# Patient Record
Sex: Female | Born: 1945
Health system: Southern US, Community
[De-identification: ages and names within clinical notes are randomized; demographics above are authoritative.]

## PROBLEM LIST (undated history)

## (undated) DIAGNOSIS — E785 Hyperlipidemia, unspecified: Secondary | ICD-10-CM

## (undated) DIAGNOSIS — I10 Essential (primary) hypertension: Secondary | ICD-10-CM

## (undated) DIAGNOSIS — K859 Acute pancreatitis without necrosis or infection, unspecified: Secondary | ICD-10-CM

## (undated) DIAGNOSIS — G2581 Restless legs syndrome: Secondary | ICD-10-CM

## (undated) DIAGNOSIS — F329 Major depressive disorder, single episode, unspecified: Secondary | ICD-10-CM

## (undated) DIAGNOSIS — F32A Depression, unspecified: Secondary | ICD-10-CM

## (undated) DIAGNOSIS — I240 Acute coronary thrombosis not resulting in myocardial infarction: Secondary | ICD-10-CM

## (undated) HISTORY — PX: FOOT SURGERY: SHX648

## (undated) HISTORY — DX: Hyperlipidemia, unspecified: E78.5

## (undated) HISTORY — DX: Essential (primary) hypertension: I10

## (undated) HISTORY — PX: TUBAL LIGATION: SHX77

## (undated) HISTORY — DX: Depression, unspecified: F32.A

## (undated) HISTORY — DX: Major depressive disorder, single episode, unspecified: F32.9

## (undated) HISTORY — PX: ROTATOR CUFF REPAIR: SHX139

## (undated) HISTORY — DX: Restless legs syndrome: G25.81

## (undated) HISTORY — PX: CORONARY ANGIOPLASTY WITH STENT PLACEMENT: SHX49

## (undated) HISTORY — PX: CATARACT EXTRACTION, BILATERAL: SHX1313

---

## 1998-06-16 ENCOUNTER — Encounter: Admission: RE | Admit: 1998-06-16 | Discharge: 1998-09-14 | Payer: Self-pay | Admitting: Family Medicine

## 1999-01-25 ENCOUNTER — Encounter: Admission: RE | Admit: 1999-01-25 | Discharge: 1999-03-01 | Payer: Self-pay | Admitting: Family Medicine

## 2000-06-12 ENCOUNTER — Other Ambulatory Visit: Admission: RE | Admit: 2000-06-12 | Discharge: 2000-06-12 | Payer: Self-pay | Admitting: Obstetrics and Gynecology

## 2003-10-30 ENCOUNTER — Emergency Department (HOSPITAL_COMMUNITY): Admission: EM | Admit: 2003-10-30 | Discharge: 2003-10-30 | Payer: Self-pay | Admitting: Emergency Medicine

## 2003-11-26 ENCOUNTER — Emergency Department (HOSPITAL_COMMUNITY): Admission: EM | Admit: 2003-11-26 | Discharge: 2003-11-26 | Payer: Self-pay | Admitting: Emergency Medicine

## 2004-06-02 ENCOUNTER — Emergency Department (HOSPITAL_COMMUNITY): Admission: EM | Admit: 2004-06-02 | Discharge: 2004-06-02 | Payer: Self-pay | Admitting: Emergency Medicine

## 2004-09-07 ENCOUNTER — Emergency Department (HOSPITAL_COMMUNITY): Admission: EM | Admit: 2004-09-07 | Discharge: 2004-09-07 | Payer: Self-pay | Admitting: Emergency Medicine

## 2004-12-01 ENCOUNTER — Other Ambulatory Visit: Admission: RE | Admit: 2004-12-01 | Discharge: 2004-12-01 | Payer: Self-pay | Admitting: Obstetrics and Gynecology

## 2006-12-16 ENCOUNTER — Ambulatory Visit: Payer: Self-pay | Admitting: Family Medicine

## 2007-02-27 ENCOUNTER — Ambulatory Visit (HOSPITAL_COMMUNITY): Admission: RE | Admit: 2007-02-27 | Discharge: 2007-02-27 | Payer: Self-pay | Admitting: Emergency Medicine

## 2007-02-27 ENCOUNTER — Ambulatory Visit: Payer: Self-pay | Admitting: Vascular Surgery

## 2007-02-27 ENCOUNTER — Emergency Department (HOSPITAL_COMMUNITY): Admission: EM | Admit: 2007-02-27 | Discharge: 2007-02-27 | Payer: Self-pay | Admitting: Emergency Medicine

## 2007-02-27 ENCOUNTER — Encounter (INDEPENDENT_AMBULATORY_CARE_PROVIDER_SITE_OTHER): Payer: Self-pay | Admitting: Emergency Medicine

## 2007-03-07 DIAGNOSIS — I10 Essential (primary) hypertension: Secondary | ICD-10-CM | POA: Insufficient documentation

## 2007-03-07 DIAGNOSIS — E1165 Type 2 diabetes mellitus with hyperglycemia: Secondary | ICD-10-CM | POA: Insufficient documentation

## 2007-03-11 ENCOUNTER — Encounter: Payer: Self-pay | Admitting: Family Medicine

## 2007-03-11 ENCOUNTER — Ambulatory Visit: Payer: Self-pay | Admitting: Family Medicine

## 2007-03-11 ENCOUNTER — Other Ambulatory Visit: Admission: RE | Admit: 2007-03-11 | Discharge: 2007-03-11 | Payer: Self-pay | Admitting: Family Medicine

## 2007-03-11 DIAGNOSIS — M25559 Pain in unspecified hip: Secondary | ICD-10-CM | POA: Insufficient documentation

## 2007-03-11 DIAGNOSIS — Z8669 Personal history of other diseases of the nervous system and sense organs: Secondary | ICD-10-CM | POA: Insufficient documentation

## 2007-03-13 ENCOUNTER — Encounter: Payer: Self-pay | Admitting: Family Medicine

## 2007-03-14 ENCOUNTER — Encounter: Admission: RE | Admit: 2007-03-14 | Discharge: 2007-03-14 | Payer: Self-pay | Admitting: Family Medicine

## 2007-03-14 ENCOUNTER — Encounter: Payer: Self-pay | Admitting: Family Medicine

## 2007-03-14 LAB — CONVERTED CEMR LAB
Albumin: 4.2 g/dL (ref 3.5–5.2)
Basophils Absolute: 0 10*3/uL (ref 0.0–0.1)
Creatinine, Ser: 0.8 mg/dL (ref 0.4–1.2)
Direct LDL: 66.8 mg/dL
Eosinophils Absolute: 0.1 10*3/uL (ref 0.0–0.6)
HCT: 34.9 % — ABNORMAL LOW (ref 36.0–46.0)
Hemoglobin: 12.3 g/dL (ref 12.0–15.0)
Lymphocytes Relative: 39.6 % (ref 12.0–46.0)
MCHC: 35.4 g/dL (ref 30.0–36.0)
MCV: 86.1 fL (ref 78.0–100.0)
Monocytes Absolute: 0.5 10*3/uL (ref 0.2–0.7)
Neutrophils Relative %: 51.1 % (ref 43.0–77.0)
Potassium: 3.3 meq/L — ABNORMAL LOW (ref 3.5–5.1)
Sodium: 141 meq/L (ref 135–145)
TSH: 0.92 microintl units/mL (ref 0.35–5.50)
Total Bilirubin: 1.1 mg/dL (ref 0.3–1.2)

## 2007-03-17 ENCOUNTER — Encounter (INDEPENDENT_AMBULATORY_CARE_PROVIDER_SITE_OTHER): Payer: Self-pay | Admitting: *Deleted

## 2007-03-20 ENCOUNTER — Encounter: Payer: Self-pay | Admitting: Family Medicine

## 2007-03-20 ENCOUNTER — Ambulatory Visit: Payer: Self-pay | Admitting: Internal Medicine

## 2007-03-27 ENCOUNTER — Encounter: Admission: RE | Admit: 2007-03-27 | Discharge: 2007-03-27 | Payer: Self-pay | Admitting: Family Medicine

## 2007-03-31 ENCOUNTER — Encounter (INDEPENDENT_AMBULATORY_CARE_PROVIDER_SITE_OTHER): Payer: Self-pay | Admitting: *Deleted

## 2007-04-01 ENCOUNTER — Encounter (INDEPENDENT_AMBULATORY_CARE_PROVIDER_SITE_OTHER): Payer: Self-pay | Admitting: *Deleted

## 2007-04-10 ENCOUNTER — Ambulatory Visit: Payer: Self-pay | Admitting: Gastroenterology

## 2007-04-23 ENCOUNTER — Ambulatory Visit: Payer: Self-pay | Admitting: Gastroenterology

## 2007-04-23 ENCOUNTER — Encounter: Payer: Self-pay | Admitting: Family Medicine

## 2007-04-23 LAB — HM COLONOSCOPY

## 2007-05-02 ENCOUNTER — Encounter: Payer: Self-pay | Admitting: Family Medicine

## 2007-05-14 ENCOUNTER — Encounter: Payer: Self-pay | Admitting: Family Medicine

## 2007-06-15 ENCOUNTER — Emergency Department (HOSPITAL_COMMUNITY): Admission: EM | Admit: 2007-06-15 | Discharge: 2007-06-15 | Payer: Self-pay | Admitting: Emergency Medicine

## 2007-07-07 ENCOUNTER — Ambulatory Visit: Payer: Self-pay | Admitting: Family Medicine

## 2007-07-07 DIAGNOSIS — F329 Major depressive disorder, single episode, unspecified: Secondary | ICD-10-CM

## 2007-07-07 DIAGNOSIS — F32A Depression, unspecified: Secondary | ICD-10-CM | POA: Insufficient documentation

## 2007-09-11 ENCOUNTER — Encounter: Payer: Self-pay | Admitting: Family Medicine

## 2007-10-07 ENCOUNTER — Telehealth (INDEPENDENT_AMBULATORY_CARE_PROVIDER_SITE_OTHER): Payer: Self-pay | Admitting: *Deleted

## 2007-11-20 ENCOUNTER — Ambulatory Visit: Payer: Self-pay | Admitting: Family Medicine

## 2007-11-24 ENCOUNTER — Encounter (INDEPENDENT_AMBULATORY_CARE_PROVIDER_SITE_OTHER): Payer: Self-pay | Admitting: *Deleted

## 2007-11-24 LAB — CONVERTED CEMR LAB: TSH: 0.62 microintl units/mL (ref 0.35–5.50)

## 2007-11-28 ENCOUNTER — Encounter: Admission: RE | Admit: 2007-11-28 | Discharge: 2007-11-28 | Payer: Self-pay | Admitting: Family Medicine

## 2007-12-01 ENCOUNTER — Encounter (INDEPENDENT_AMBULATORY_CARE_PROVIDER_SITE_OTHER): Payer: Self-pay | Admitting: *Deleted

## 2007-12-04 ENCOUNTER — Encounter: Payer: Self-pay | Admitting: Family Medicine

## 2007-12-11 ENCOUNTER — Encounter: Payer: Self-pay | Admitting: Family Medicine

## 2007-12-26 ENCOUNTER — Ambulatory Visit: Payer: Self-pay | Admitting: Endocrinology

## 2007-12-26 ENCOUNTER — Other Ambulatory Visit: Admission: RE | Admit: 2007-12-26 | Discharge: 2007-12-26 | Payer: Self-pay | Admitting: Endocrinology

## 2007-12-26 ENCOUNTER — Encounter: Payer: Self-pay | Admitting: Endocrinology

## 2007-12-26 DIAGNOSIS — E042 Nontoxic multinodular goiter: Secondary | ICD-10-CM | POA: Insufficient documentation

## 2007-12-29 ENCOUNTER — Encounter: Payer: Self-pay | Admitting: Endocrinology

## 2007-12-29 ENCOUNTER — Encounter: Payer: Self-pay | Admitting: Family Medicine

## 2008-01-07 ENCOUNTER — Encounter: Payer: Self-pay | Admitting: Family Medicine

## 2008-02-03 ENCOUNTER — Telehealth (INDEPENDENT_AMBULATORY_CARE_PROVIDER_SITE_OTHER): Payer: Self-pay | Admitting: *Deleted

## 2008-02-13 ENCOUNTER — Ambulatory Visit: Payer: Self-pay | Admitting: Internal Medicine

## 2008-02-13 DIAGNOSIS — R1032 Left lower quadrant pain: Secondary | ICD-10-CM | POA: Insufficient documentation

## 2008-02-13 DIAGNOSIS — M79609 Pain in unspecified limb: Secondary | ICD-10-CM | POA: Insufficient documentation

## 2008-02-13 LAB — CONVERTED CEMR LAB
Nitrite: POSITIVE
Protein, U semiquant: 30
RBC / HPF: NONE SEEN (ref <3)
Specific Gravity, Urine: 1.02
Urobilinogen, UA: 0.2
WBC Urine, dipstick: NEGATIVE

## 2008-02-14 ENCOUNTER — Encounter: Payer: Self-pay | Admitting: Internal Medicine

## 2008-02-16 ENCOUNTER — Telehealth (INDEPENDENT_AMBULATORY_CARE_PROVIDER_SITE_OTHER): Payer: Self-pay | Admitting: *Deleted

## 2008-02-17 LAB — CONVERTED CEMR LAB
BUN: 18 mg/dL (ref 6–23)
CO2: 30 meq/L (ref 19–32)
Chloride: 110 meq/L (ref 96–112)
Creatinine, Ser: 0.9 mg/dL (ref 0.4–1.2)
Glucose, Bld: 142 mg/dL — ABNORMAL HIGH (ref 70–99)

## 2008-03-04 ENCOUNTER — Ambulatory Visit: Payer: Self-pay | Admitting: Internal Medicine

## 2008-03-04 DIAGNOSIS — B359 Dermatophytosis, unspecified: Secondary | ICD-10-CM | POA: Insufficient documentation

## 2008-03-04 DIAGNOSIS — R21 Rash and other nonspecific skin eruption: Secondary | ICD-10-CM | POA: Insufficient documentation

## 2008-03-24 ENCOUNTER — Telehealth (INDEPENDENT_AMBULATORY_CARE_PROVIDER_SITE_OTHER): Payer: Self-pay | Admitting: *Deleted

## 2008-03-24 ENCOUNTER — Emergency Department (HOSPITAL_COMMUNITY): Admission: EM | Admit: 2008-03-24 | Discharge: 2008-03-24 | Payer: Self-pay | Admitting: Emergency Medicine

## 2008-03-25 ENCOUNTER — Telehealth (INDEPENDENT_AMBULATORY_CARE_PROVIDER_SITE_OTHER): Payer: Self-pay | Admitting: *Deleted

## 2008-03-27 ENCOUNTER — Emergency Department (HOSPITAL_COMMUNITY): Admission: EM | Admit: 2008-03-27 | Discharge: 2008-03-28 | Payer: Self-pay | Admitting: Emergency Medicine

## 2008-03-29 ENCOUNTER — Ambulatory Visit: Payer: Self-pay | Admitting: Internal Medicine

## 2008-03-29 DIAGNOSIS — S8990XA Unspecified injury of unspecified lower leg, initial encounter: Secondary | ICD-10-CM | POA: Insufficient documentation

## 2008-03-29 DIAGNOSIS — S99929A Unspecified injury of unspecified foot, initial encounter: Secondary | ICD-10-CM

## 2008-03-29 DIAGNOSIS — R079 Chest pain, unspecified: Secondary | ICD-10-CM | POA: Insufficient documentation

## 2008-03-29 DIAGNOSIS — Z9889 Other specified postprocedural states: Secondary | ICD-10-CM | POA: Insufficient documentation

## 2008-03-29 DIAGNOSIS — S99919A Unspecified injury of unspecified ankle, initial encounter: Secondary | ICD-10-CM

## 2008-03-29 DIAGNOSIS — J189 Pneumonia, unspecified organism: Secondary | ICD-10-CM | POA: Insufficient documentation

## 2008-03-29 LAB — CONVERTED CEMR LAB
Basophils Relative: 0.7 % (ref 0.0–1.0)
Eosinophils Relative: 1.3 % (ref 0.0–5.0)
Lymphocytes Relative: 39.2 % (ref 12.0–46.0)
Neutrophils Relative %: 50.8 % (ref 43.0–77.0)
Platelets: 232 10*3/uL (ref 150–400)
RBC: 4.18 M/uL (ref 3.87–5.11)
WBC: 9.2 10*3/uL (ref 4.5–10.5)

## 2008-03-30 ENCOUNTER — Ambulatory Visit: Payer: Self-pay | Admitting: Internal Medicine

## 2008-04-01 ENCOUNTER — Telehealth (INDEPENDENT_AMBULATORY_CARE_PROVIDER_SITE_OTHER): Payer: Self-pay | Admitting: *Deleted

## 2008-04-01 ENCOUNTER — Telehealth: Payer: Self-pay | Admitting: Internal Medicine

## 2008-04-02 ENCOUNTER — Ambulatory Visit: Payer: Self-pay | Admitting: Family Medicine

## 2008-04-02 DIAGNOSIS — M25511 Pain in right shoulder: Secondary | ICD-10-CM | POA: Insufficient documentation

## 2008-04-02 DIAGNOSIS — J9819 Other pulmonary collapse: Secondary | ICD-10-CM | POA: Insufficient documentation

## 2008-04-29 ENCOUNTER — Telehealth (INDEPENDENT_AMBULATORY_CARE_PROVIDER_SITE_OTHER): Payer: Self-pay | Admitting: *Deleted

## 2008-05-12 ENCOUNTER — Ambulatory Visit: Payer: Self-pay | Admitting: *Deleted

## 2008-05-12 DIAGNOSIS — G5600 Carpal tunnel syndrome, unspecified upper limb: Secondary | ICD-10-CM | POA: Insufficient documentation

## 2008-05-24 ENCOUNTER — Ambulatory Visit: Payer: Self-pay | Admitting: Family Medicine

## 2008-05-24 DIAGNOSIS — M25569 Pain in unspecified knee: Secondary | ICD-10-CM | POA: Insufficient documentation

## 2008-05-27 ENCOUNTER — Ambulatory Visit: Payer: Self-pay | Admitting: Family Medicine

## 2008-05-27 ENCOUNTER — Encounter (INDEPENDENT_AMBULATORY_CARE_PROVIDER_SITE_OTHER): Payer: Self-pay | Admitting: *Deleted

## 2008-08-12 ENCOUNTER — Telehealth (INDEPENDENT_AMBULATORY_CARE_PROVIDER_SITE_OTHER): Payer: Self-pay | Admitting: *Deleted

## 2008-08-12 ENCOUNTER — Ambulatory Visit: Payer: Self-pay | Admitting: Family Medicine

## 2008-08-12 DIAGNOSIS — J069 Acute upper respiratory infection, unspecified: Secondary | ICD-10-CM | POA: Insufficient documentation

## 2008-08-30 ENCOUNTER — Observation Stay (HOSPITAL_COMMUNITY): Admission: EM | Admit: 2008-08-30 | Discharge: 2008-08-31 | Payer: Self-pay | Admitting: Emergency Medicine

## 2008-08-30 ENCOUNTER — Ambulatory Visit: Payer: Self-pay | Admitting: Internal Medicine

## 2008-09-07 ENCOUNTER — Ambulatory Visit: Payer: Self-pay | Admitting: Family Medicine

## 2008-09-07 DIAGNOSIS — K859 Acute pancreatitis without necrosis or infection, unspecified: Secondary | ICD-10-CM | POA: Insufficient documentation

## 2008-09-07 DIAGNOSIS — N83209 Unspecified ovarian cyst, unspecified side: Secondary | ICD-10-CM | POA: Insufficient documentation

## 2008-09-08 ENCOUNTER — Telehealth (INDEPENDENT_AMBULATORY_CARE_PROVIDER_SITE_OTHER): Payer: Self-pay | Admitting: *Deleted

## 2008-09-10 ENCOUNTER — Encounter (INDEPENDENT_AMBULATORY_CARE_PROVIDER_SITE_OTHER): Payer: Self-pay | Admitting: *Deleted

## 2008-09-10 LAB — CONVERTED CEMR LAB: Lipase: 47 units/L (ref 11.0–59.0)

## 2008-09-13 ENCOUNTER — Encounter: Admission: RE | Admit: 2008-09-13 | Discharge: 2008-09-13 | Payer: Self-pay | Admitting: Family Medicine

## 2008-09-16 ENCOUNTER — Telehealth (INDEPENDENT_AMBULATORY_CARE_PROVIDER_SITE_OTHER): Payer: Self-pay | Admitting: *Deleted

## 2008-09-17 ENCOUNTER — Encounter (INDEPENDENT_AMBULATORY_CARE_PROVIDER_SITE_OTHER): Payer: Self-pay | Admitting: *Deleted

## 2008-09-24 ENCOUNTER — Telehealth (INDEPENDENT_AMBULATORY_CARE_PROVIDER_SITE_OTHER): Payer: Self-pay | Admitting: *Deleted

## 2008-10-07 ENCOUNTER — Ambulatory Visit: Payer: Self-pay | Admitting: Family Medicine

## 2008-10-07 DIAGNOSIS — E785 Hyperlipidemia, unspecified: Secondary | ICD-10-CM | POA: Insufficient documentation

## 2008-11-24 ENCOUNTER — Encounter: Payer: Self-pay | Admitting: Family Medicine

## 2008-11-24 ENCOUNTER — Telehealth: Payer: Self-pay | Admitting: Family Medicine

## 2008-11-25 ENCOUNTER — Ambulatory Visit: Payer: Self-pay | Admitting: Family Medicine

## 2008-11-25 DIAGNOSIS — J019 Acute sinusitis, unspecified: Secondary | ICD-10-CM | POA: Insufficient documentation

## 2008-11-25 LAB — CONVERTED CEMR LAB: Rapid Strep: NEGATIVE

## 2008-11-29 ENCOUNTER — Telehealth: Payer: Self-pay | Admitting: Family Medicine

## 2009-01-28 ENCOUNTER — Telehealth (INDEPENDENT_AMBULATORY_CARE_PROVIDER_SITE_OTHER): Payer: Self-pay | Admitting: *Deleted

## 2009-03-03 ENCOUNTER — Inpatient Hospital Stay (HOSPITAL_COMMUNITY): Admission: EM | Admit: 2009-03-03 | Discharge: 2009-03-14 | Payer: Self-pay | Admitting: Emergency Medicine

## 2009-03-14 ENCOUNTER — Encounter (INDEPENDENT_AMBULATORY_CARE_PROVIDER_SITE_OTHER): Payer: Self-pay | Admitting: Interventional Radiology

## 2009-03-21 ENCOUNTER — Ambulatory Visit: Payer: Self-pay | Admitting: Family Medicine

## 2009-03-21 LAB — CONVERTED CEMR LAB
Glucose, Bld: 211 mg/dL
Lipase: 66 units/L — ABNORMAL HIGH (ref 11.0–59.0)

## 2009-03-22 LAB — CONVERTED CEMR LAB
Albumin: 3.5 g/dL (ref 3.5–5.2)
Alkaline Phosphatase: 102 units/L (ref 39–117)
Calcium: 10.9 mg/dL — ABNORMAL HIGH (ref 8.4–10.5)
Creatinine, Ser: 0.9 mg/dL (ref 0.4–1.2)
GFR calc non Af Amer: 81.28 mL/min (ref >60)
Glucose, Bld: 176 mg/dL — ABNORMAL HIGH (ref 70–99)
Sodium: 140 meq/L (ref 135–145)
Total Protein: 7.3 g/dL (ref 6.0–8.3)

## 2009-03-24 ENCOUNTER — Telehealth (INDEPENDENT_AMBULATORY_CARE_PROVIDER_SITE_OTHER): Payer: Self-pay | Admitting: *Deleted

## 2009-03-25 ENCOUNTER — Ambulatory Visit: Payer: Self-pay | Admitting: Gastroenterology

## 2009-03-25 ENCOUNTER — Telehealth: Payer: Self-pay | Admitting: Gastroenterology

## 2009-03-25 DIAGNOSIS — K219 Gastro-esophageal reflux disease without esophagitis: Secondary | ICD-10-CM | POA: Insufficient documentation

## 2009-03-25 DIAGNOSIS — R141 Gas pain: Secondary | ICD-10-CM | POA: Insufficient documentation

## 2009-03-25 DIAGNOSIS — R112 Nausea with vomiting, unspecified: Secondary | ICD-10-CM | POA: Insufficient documentation

## 2009-03-25 DIAGNOSIS — R634 Abnormal weight loss: Secondary | ICD-10-CM | POA: Insufficient documentation

## 2009-03-25 DIAGNOSIS — D509 Iron deficiency anemia, unspecified: Secondary | ICD-10-CM | POA: Insufficient documentation

## 2009-03-25 DIAGNOSIS — R142 Eructation: Secondary | ICD-10-CM

## 2009-03-25 DIAGNOSIS — R143 Flatulence: Secondary | ICD-10-CM

## 2009-03-29 LAB — CONVERTED CEMR LAB
Amylase: 67 units/L (ref 27–131)
Basophils Relative: 0.5 % (ref 0.0–3.0)
Eosinophils Absolute: 0.1 10*3/uL (ref 0.0–0.7)
Eosinophils Relative: 1.3 % (ref 0.0–5.0)
Ferritin: 346.3 ng/mL — ABNORMAL HIGH (ref 10.0–291.0)
Folate: 14.3 ng/mL
Hemoglobin: 11.1 g/dL — ABNORMAL LOW (ref 12.0–15.0)
Iron: 58 ug/dL (ref 42–145)
LDL Cholesterol: 114 mg/dL — ABNORMAL HIGH (ref 0–99)
Lymphocytes Relative: 36.9 % (ref 12.0–46.0)
MCHC: 34.9 g/dL (ref 30.0–36.0)
Monocytes Relative: 7.2 % (ref 3.0–12.0)
Neutro Abs: 3.9 10*3/uL (ref 1.4–7.7)
Neutrophils Relative %: 54.1 % (ref 43.0–77.0)
RBC: 3.66 M/uL — ABNORMAL LOW (ref 3.87–5.11)
Saturation Ratios: 20 % (ref 20.0–50.0)
Total CHOL/HDL Ratio: 5
Transferrin: 206.9 mg/dL — ABNORMAL LOW (ref 212.0–360.0)
Triglycerides: 122 mg/dL (ref 0.0–149.0)
WBC: 7.1 10*3/uL (ref 4.5–10.5)

## 2009-03-31 ENCOUNTER — Telehealth: Payer: Self-pay | Admitting: Gastroenterology

## 2009-04-04 ENCOUNTER — Telehealth (INDEPENDENT_AMBULATORY_CARE_PROVIDER_SITE_OTHER): Payer: Self-pay | Admitting: *Deleted

## 2009-04-07 ENCOUNTER — Encounter: Payer: Self-pay | Admitting: Family Medicine

## 2009-04-13 ENCOUNTER — Ambulatory Visit: Payer: Self-pay | Admitting: Gastroenterology

## 2009-04-19 ENCOUNTER — Encounter: Payer: Self-pay | Admitting: Family Medicine

## 2009-04-23 ENCOUNTER — Emergency Department (HOSPITAL_COMMUNITY): Admission: EM | Admit: 2009-04-23 | Discharge: 2009-04-23 | Payer: Self-pay | Admitting: Emergency Medicine

## 2009-05-03 ENCOUNTER — Encounter: Payer: Self-pay | Admitting: Family Medicine

## 2009-05-13 ENCOUNTER — Ambulatory Visit: Payer: Self-pay | Admitting: Family Medicine

## 2009-05-13 DIAGNOSIS — M549 Dorsalgia, unspecified: Secondary | ICD-10-CM | POA: Insufficient documentation

## 2009-05-17 ENCOUNTER — Telehealth (INDEPENDENT_AMBULATORY_CARE_PROVIDER_SITE_OTHER): Payer: Self-pay | Admitting: *Deleted

## 2009-05-18 ENCOUNTER — Ambulatory Visit: Payer: Self-pay | Admitting: Family Medicine

## 2009-05-19 ENCOUNTER — Telehealth (INDEPENDENT_AMBULATORY_CARE_PROVIDER_SITE_OTHER): Payer: Self-pay | Admitting: *Deleted

## 2009-05-20 ENCOUNTER — Telehealth (INDEPENDENT_AMBULATORY_CARE_PROVIDER_SITE_OTHER): Payer: Self-pay | Admitting: *Deleted

## 2009-05-23 ENCOUNTER — Telehealth (INDEPENDENT_AMBULATORY_CARE_PROVIDER_SITE_OTHER): Payer: Self-pay | Admitting: *Deleted

## 2009-05-24 ENCOUNTER — Ambulatory Visit: Payer: Self-pay | Admitting: Family Medicine

## 2009-05-24 ENCOUNTER — Emergency Department (HOSPITAL_COMMUNITY): Admission: EM | Admit: 2009-05-24 | Discharge: 2009-05-24 | Payer: Self-pay | Admitting: Emergency Medicine

## 2009-05-24 DIAGNOSIS — M546 Pain in thoracic spine: Secondary | ICD-10-CM | POA: Insufficient documentation

## 2009-05-26 ENCOUNTER — Telehealth: Payer: Self-pay | Admitting: Family Medicine

## 2009-06-14 ENCOUNTER — Encounter: Payer: Self-pay | Admitting: Family Medicine

## 2009-06-17 ENCOUNTER — Encounter: Admission: RE | Admit: 2009-06-17 | Discharge: 2009-06-17 | Payer: Self-pay | Admitting: Family Medicine

## 2009-06-21 ENCOUNTER — Telehealth: Payer: Self-pay | Admitting: Family Medicine

## 2009-08-02 ENCOUNTER — Encounter: Payer: Self-pay | Admitting: Emergency Medicine

## 2009-08-03 ENCOUNTER — Encounter (INDEPENDENT_AMBULATORY_CARE_PROVIDER_SITE_OTHER): Payer: Self-pay | Admitting: Internal Medicine

## 2009-08-03 ENCOUNTER — Observation Stay (HOSPITAL_COMMUNITY): Admission: AD | Admit: 2009-08-03 | Discharge: 2009-08-04 | Payer: Self-pay | Admitting: Internal Medicine

## 2009-08-03 ENCOUNTER — Ambulatory Visit: Payer: Self-pay | Admitting: Cardiology

## 2009-08-04 ENCOUNTER — Encounter: Payer: Self-pay | Admitting: Cardiovascular Disease

## 2009-08-18 ENCOUNTER — Ambulatory Visit: Payer: Self-pay | Admitting: Family Medicine

## 2009-08-19 ENCOUNTER — Telehealth (INDEPENDENT_AMBULATORY_CARE_PROVIDER_SITE_OTHER): Payer: Self-pay | Admitting: *Deleted

## 2009-08-19 LAB — CONVERTED CEMR LAB
ALT: 16 units/L (ref 0–35)
AST: 21 units/L (ref 0–37)
Albumin: 4 g/dL (ref 3.5–5.2)
Alkaline Phosphatase: 74 units/L (ref 39–117)
Basophils Absolute: 0 10*3/uL (ref 0.0–0.1)
Calcium: 9.3 mg/dL (ref 8.4–10.5)
Eosinophils Relative: 0.9 % (ref 0.0–5.0)
Folate: 11.2 ng/mL
GFR calc non Af Amer: 92.99 mL/min (ref >60)
Glucose, Bld: 205 mg/dL — ABNORMAL HIGH (ref 70–99)
HCT: 33.4 % — ABNORMAL LOW (ref 36.0–46.0)
HDL: 29.7 mg/dL — ABNORMAL LOW (ref >39.00)
Hemoglobin: 11.6 g/dL — ABNORMAL LOW (ref 12.0–15.0)
Hgb A1c MFr Bld: 9.3 % — ABNORMAL HIGH (ref 4.6–6.5)
Lymphocytes Relative: 42.7 % (ref 12.0–46.0)
Microalb, Ur: 18 mg/dL — ABNORMAL HIGH (ref 0.0–1.9)
Monocytes Relative: 6.4 % (ref 3.0–12.0)
Neutro Abs: 3.5 10*3/uL (ref 1.4–7.7)
Potassium: 3.5 meq/L (ref 3.5–5.1)
RDW: 13 % (ref 11.5–14.6)
Sodium: 142 meq/L (ref 135–145)
Total Bilirubin: 1.4 mg/dL — ABNORMAL HIGH (ref 0.3–1.2)
Total CHOL/HDL Ratio: 6
VLDL: 34.2 mg/dL (ref 0.0–40.0)
Vitamin B-12: 526 pg/mL (ref 211–911)
WBC: 7.2 10*3/uL (ref 4.5–10.5)

## 2009-08-31 ENCOUNTER — Encounter: Payer: Self-pay | Admitting: Family Medicine

## 2009-09-08 ENCOUNTER — Ambulatory Visit: Payer: Self-pay | Admitting: Family Medicine

## 2009-09-08 ENCOUNTER — Encounter: Admission: RE | Admit: 2009-09-08 | Discharge: 2009-09-08 | Payer: Self-pay | Admitting: Otolaryngology

## 2009-10-03 ENCOUNTER — Telehealth: Payer: Self-pay | Admitting: Family Medicine

## 2009-10-13 ENCOUNTER — Ambulatory Visit: Payer: Self-pay | Admitting: Family Medicine

## 2009-11-28 ENCOUNTER — Ambulatory Visit: Payer: Self-pay | Admitting: Family Medicine

## 2010-04-07 ENCOUNTER — Telehealth (INDEPENDENT_AMBULATORY_CARE_PROVIDER_SITE_OTHER): Payer: Self-pay | Admitting: *Deleted

## 2010-05-04 ENCOUNTER — Telehealth (INDEPENDENT_AMBULATORY_CARE_PROVIDER_SITE_OTHER): Payer: Self-pay | Admitting: *Deleted

## 2010-06-13 ENCOUNTER — Encounter (INDEPENDENT_AMBULATORY_CARE_PROVIDER_SITE_OTHER): Payer: Self-pay | Admitting: *Deleted

## 2010-08-14 ENCOUNTER — Ambulatory Visit: Payer: Self-pay | Admitting: Family Medicine

## 2010-08-14 DIAGNOSIS — B373 Candidiasis of vulva and vagina: Secondary | ICD-10-CM | POA: Insufficient documentation

## 2010-08-15 ENCOUNTER — Ambulatory Visit: Payer: Self-pay | Admitting: Family Medicine

## 2010-08-17 ENCOUNTER — Encounter: Payer: Self-pay | Admitting: Family Medicine

## 2010-08-17 ENCOUNTER — Encounter (INDEPENDENT_AMBULATORY_CARE_PROVIDER_SITE_OTHER): Payer: Self-pay | Admitting: *Deleted

## 2010-08-28 ENCOUNTER — Encounter: Payer: Self-pay | Admitting: Family Medicine

## 2010-09-11 ENCOUNTER — Encounter: Payer: Self-pay | Admitting: Family Medicine

## 2010-09-14 ENCOUNTER — Ambulatory Visit: Payer: Self-pay | Admitting: Family Medicine

## 2010-09-15 ENCOUNTER — Telehealth: Payer: Self-pay | Admitting: Family Medicine

## 2010-10-13 ENCOUNTER — Emergency Department (HOSPITAL_COMMUNITY)
Admission: EM | Admit: 2010-10-13 | Discharge: 2010-10-14 | Payer: Self-pay | Source: Home / Self Care | Admitting: Emergency Medicine

## 2010-10-14 ENCOUNTER — Emergency Department (HOSPITAL_COMMUNITY)
Admission: EM | Admit: 2010-10-14 | Discharge: 2010-10-14 | Payer: Self-pay | Source: Home / Self Care | Admitting: Family Medicine

## 2010-10-17 ENCOUNTER — Ambulatory Visit
Admission: RE | Admit: 2010-10-17 | Discharge: 2010-10-17 | Payer: Self-pay | Source: Home / Self Care | Attending: Family Medicine | Admitting: Family Medicine

## 2010-10-19 ENCOUNTER — Encounter: Payer: Self-pay | Admitting: Family Medicine

## 2010-10-23 ENCOUNTER — Encounter: Payer: Self-pay | Admitting: Family Medicine

## 2010-10-23 ENCOUNTER — Ambulatory Visit
Admission: RE | Admit: 2010-10-23 | Discharge: 2010-10-23 | Payer: Self-pay | Source: Home / Self Care | Attending: Family Medicine | Admitting: Family Medicine

## 2010-10-23 ENCOUNTER — Telehealth: Payer: Self-pay | Admitting: Family Medicine

## 2010-10-23 LAB — URINE MICROSCOPIC-ADD ON

## 2010-10-23 LAB — COMPREHENSIVE METABOLIC PANEL
ALT: 16 U/L (ref 0–35)
AST: 21 U/L (ref 0–37)
Albumin: 4.4 g/dL (ref 3.5–5.2)
Alkaline Phosphatase: 90 U/L (ref 39–117)
BUN: 13 mg/dL (ref 6–23)
CO2: 24 mEq/L (ref 19–32)
Calcium: 10.2 mg/dL (ref 8.4–10.5)
Chloride: 101 mEq/L (ref 96–112)
Creatinine, Ser: 0.79 mg/dL (ref 0.4–1.2)
GFR calc Af Amer: >60 mL/min (ref >60)
GFR calc non Af Amer: >60 mL/min (ref >60)
Glucose, Bld: 413 mg/dL — ABNORMAL HIGH (ref 70–99)
Potassium: 4.5 mEq/L (ref 3.5–5.1)
Sodium: 136 mEq/L (ref 135–145)
Total Bilirubin: 0.8 mg/dL (ref 0.3–1.2)
Total Protein: 7.8 g/dL (ref 6.0–8.3)

## 2010-10-23 LAB — URINALYSIS, ROUTINE W REFLEX MICROSCOPIC
Bilirubin Urine: NEGATIVE
Hgb urine dipstick: NEGATIVE
Ketones, ur: NEGATIVE mg/dL
Leukocytes, UA: NEGATIVE
Nitrite: NEGATIVE
Protein, ur: 30 mg/dL — AB
Specific Gravity, Urine: 1.038 — ABNORMAL HIGH (ref 1.005–1.030)
Urine Glucose, Fasting: >1000 mg/dL — AB
Urobilinogen, UA: 0.2 mg/dL (ref 0.0–1.0)
pH: 6 (ref 5.0–8.0)

## 2010-10-23 LAB — POCT CARDIAC MARKERS
CKMB, poc: 1.4 ng/mL (ref 1.0–8.0)
Myoglobin, poc: 60.1 ng/mL (ref 12–200)
Troponin i, poc: <0.05 ng/mL (ref 0.00–0.09)

## 2010-10-23 LAB — DIFFERENTIAL
Basophils Absolute: 0 10*3/uL (ref 0.0–0.1)
Basophils Relative: 0 % (ref 0–1)
Eosinophils Absolute: 0.1 10*3/uL (ref 0.0–0.7)
Eosinophils Relative: 1 % (ref 0–5)
Lymphocytes Relative: 44 % (ref 12–46)
Lymphs Abs: 4 10*3/uL (ref 0.7–4.0)
Monocytes Absolute: 0.6 10*3/uL (ref 0.1–1.0)
Monocytes Relative: 7 % (ref 3–12)
Neutro Abs: 4.3 10*3/uL (ref 1.7–7.7)
Neutrophils Relative %: 48 % (ref 43–77)

## 2010-10-23 LAB — CBC
HCT: 41.1 % (ref 36.0–46.0)
Hemoglobin: 14 g/dL (ref 12.0–15.0)
MCH: 29.5 pg (ref 26.0–34.0)
MCHC: 34.1 g/dL (ref 30.0–36.0)
MCV: 86.7 fL (ref 78.0–100.0)
Platelets: 152 10*3/uL (ref 150–400)
RBC: 4.74 MIL/uL (ref 3.87–5.11)
RDW: 12.6 % (ref 11.5–15.5)
WBC: 9 10*3/uL (ref 4.0–10.5)

## 2010-10-23 LAB — LIPASE, BLOOD: Lipase: 33 U/L (ref 11–59)

## 2010-10-23 LAB — GLUCOSE, CAPILLARY
Glucose-Capillary: 421 mg/dL — ABNORMAL HIGH (ref 70–99)
Glucose-Capillary: 305 mg/dL — ABNORMAL HIGH (ref 70–99)
Glucose-Capillary: 253 mg/dL — ABNORMAL HIGH (ref 70–99)

## 2010-10-23 LAB — AMYLASE: Amylase: 35 U/L (ref 0–105)

## 2010-10-24 ENCOUNTER — Telehealth: Payer: Self-pay | Admitting: Family Medicine

## 2010-10-25 ENCOUNTER — Telehealth: Payer: Self-pay | Admitting: Family Medicine

## 2010-10-27 ENCOUNTER — Telehealth: Payer: Self-pay | Admitting: Family Medicine

## 2010-10-30 ENCOUNTER — Encounter: Payer: Self-pay | Admitting: Gastroenterology

## 2010-11-07 NOTE — Progress Notes (Signed)
Summary: med not available  Phone Note Refill Request Call back at [phone] Message from:  Pharmacy  Refills Requested: Medication #1:  APIDRA SOLOSTAR 100 UNIT/ML SOLN 10 u Subcutaneously before each meal. Call from pharmacy med is on manufacture back order. Vials are available. Pls send new rx to [facility] [address]  Initial call taken by: Jeremy Johann CMA,  September 15, 2010 12:52 PM  Follow-up for Phone Call        ok to give vial --she will need syringes as well Follow-up by: Loreen Freud DO,  September 15, 2010 4:11 PM    New/Updated Medications: APIDRA 100 UNIT/ML SOLN (INSULIN GLULISINE) Take 10 u Subcutaneously before each meal BD INSULIN SYRINGE 31G X 5/16" 0.3 ML MISC (INSULIN SYRINGE-NEEDLE U-100) 10 u Subcutaneously before each meal Prescriptions: BD INSULIN SYRINGE 31G X 5/16" 0.3 ML MISC (INSULIN SYRINGE-NEEDLE U-100) 10 u Subcutaneously before each meal  #1 month x 2   Entered by:   Jeremy Johann CMA   Authorized by:   Loreen Freud DO   Signed by:   Jeremy Johann CMA on 09/15/2010   Method used:   Re-Faxed to ...       [facility]  [address] [id number]* (retail)       [address]       [place], [place]  [address]       Ph: [phone] or [phone]       Fax: [phone]   RxID:   [id number] APIDRA 100 UNIT/ML SOLN (INSULIN GLULISINE) Take 10 u Subcutaneously before each meal  #1 month x 2   Entered by:   Jeremy Johann CMA   Authorized by:   Loreen Freud DO   Signed by:   Jeremy Johann CMA on 09/15/2010   Method used:   Re-Faxed to ...       [facility]  [address] [id number]* (retail)       [address]       [place], [place]  [address]       Ph: [phone] or [phone]       Fax: [phone]   RxID:   [id number]

## 2010-11-07 NOTE — Letter (Signed)
Summary: [facility]   Imported By: Lanelle Bal 09/05/2010 10:58:50  _____________________________________________________________________  External Attachment:    Type:   Image     Comment:   External Document

## 2010-11-07 NOTE — Letter (Signed)
Summary: [facility]   Imported By: Lanelle Bal 08/29/2010 10:27:00  _____________________________________________________________________  External Attachment:    Type:   Image     Comment:   External Document

## 2010-11-07 NOTE — Assessment & Plan Note (Signed)
Summary: back pain/drb   Vital Signs:  Patient profile:   65 year old female Weight:      164.13 pounds Temp:     98.2 degrees F oral Pulse rate:   82 / minute Pulse rhythm:   regular BP sitting:   126 / 72  (left arm) Cuff size:   regular  Vitals Entered By: Army Fossa CMA (October 13, 2009 3:15 PM) CC: Pt c/o upper back pain x 1 week. , Back Pain   History of Present Illness:       This is a 65 year old woman who presents with Back Pain.  The symptoms began 1 week ago.  The patient denies fever, chills, weakness, loss of sensation, fecal incontinence, urinary incontinence, urinary retention, dysuria, rest pain, inability to work, and inability to care for self.  The pain is located in the mid thoracic back.  The pain began at home and gradually.    Current Medications (verified): 1)  Lotrel 10-20 Mg Caps (Amlodipine Besy-Benazepril Hcl) .[name].. 1 Once Daily 2)  Aspirin 81 Mg Tbec (Aspirin) .[name].. 1 Once Daily 3)  Lovaza 1 Gm Caps (Omega-3-Acid Ethyl Esters) .... 2 By Mouth Two Times A Day 4)  Lantus Solostar 100 Unit/ml Soln (Insulin Glargine) .... 45 U Subcutaneously Qpm 5)  Toprol Xl 50 Mg Xr24h-Tab (Metoprolol Succinate) .... One Tablet By Mouth Once Daily 6)  Pen Needles 5/16" 31g X 8 Mm Misc (Insulin Pen Needle) .... Pt Test 1 X A Day 7)  Humalog Pen 100 Unit/ml Soln (Insulin Lispro (Human)) .... 5 Units Three Times A Day 8)  Celexa 20 Mg Tabs (Citalopram Hydrobromide) .... 1/2 By Mouth Once Daily For 1 Week Then 1 By Mouth Once Daily 9)  Pravachol 40 Mg Tabs (Pravastatin Sodium) .[name].. 1 By Mouth At Bedtime. 10)  Precision Qid Test  Strp (Glucose Blood) .... Use As Directed. 11)  Vitamin E 12)  Stress Tab 13)  Ultram 50 Mg Tabs (Tramadol Hcl) .[name].. 1 -2 By Mouth Every 6 Hours As Needed  Allergies: 1)  ! * Levermin Insulin  Past History:  Past medical, surgical, family and social histories (including risk factors) reviewed for relevance to current acute and chronic  problems.  Past Medical History: Reviewed history from 10/07/2008 and no changes required. Diabetes mellitus, type II Hypertension High Cholesterol GOITER, UNSPECIFIED (ICD-240.9) DEPRESSION, CHRONIC (ICD-311) RESTLESS LEG SYNDROME, HX OF (ICD-V12.49) Hyperlipidemia  Past Surgical History: Reviewed history from 03/29/2008 and no changes required. Current Problems:  ROTATOR CUFF REPAIR, RIGHT, HX OF (ICD-V45.89) RASH-NONVESICULAR (ICD-782.1) RINGWORM (ICD-110.9) ABDOMINAL PAIN, LEFT LOWER QUADRANT (ICD-789.04) FINGER PAIN (ICD-729.5) GOITER, MULTINODULAR (ICD-241.1) DEPRESSION, CHRONIC (ICD-311) RESTLESS LEG SYNDROME, HX OF (ICD-V12.49) PREVENTIVE HEALTH CARE (ICD-V70.0) HIP PAIN, RIGHT (ICD-719.45) HYPERTENSION (ICD-401.9) DIABETES MELLITUS, TYPE II (ICD-250.00) Tubal ligation  Family History: Reviewed history from 03/25/2009 and no changes required. Family History Lung cancer Family History Other cancer-Breast,Thyroid Family History Uterine cancer daughter has benign nodules in thyroid No FH of Colon Cancer:  Social History: Reviewed history from 03/25/2009 and no changes required. 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  pt stopped 3 weeks ago  Daily Caffeine Use: 3 cups daily  Illicit Drug Use - no Patient does not get regular exercise.   Review of Systems      See HPI  Physical Exam  General:  Well-developed,well-nourished,in no acute distress; alert,appropriate and cooperative throughout examination Msk:  normal ROM, no joint tenderness, no joint  swelling, no joint warmth, no redness over joints, no joint deformities, no joint instability, and no crepitation.   Skin:  Intact without suspicious lesions or rashes Psych:  Cognition and judgment appear intact. Alert and cooperative with normal attention span and concentration. No apparent delusions, illusions, hallucinations   Impression &  Recommendations:  Problem # 1:  BACK PAIN, THORACIC REGION, RIGHT (ICD-724.1)  Her updated medication list for this problem includes:    Aspirin 81 Mg Tbec (Aspirin) .[name]... 1 once daily    Ultram 50 Mg Tabs (Tramadol hcl) .[name]... 1 -2 by mouth every 6 hours as needed  Orders: Physical Therapy Referral (PT)  Discussed use of moist heat or ice, modified activities, medications, and stretching/strengthening exercises. Back care instructions given. To be seen in 2 weeks if no improvement; sooner if worsening of symptoms.   Complete Medication List: 1)  Lotrel 10-20 Mg Caps (Amlodipine besy-benazepril hcl) .[name].. 1 once daily 2)  Aspirin 81 Mg Tbec (Aspirin) .[name].. 1 once daily 3)  Lovaza 1 Gm Caps (Omega-3-acid ethyl esters) .... 2 by mouth two times a day 4)  Lantus Solostar 100 Unit/ml Soln (Insulin glargine) .... 45 u subcutaneously qpm 5)  Toprol Xl 50 Mg Xr24h-tab (Metoprolol succinate) .... One tablet by mouth once daily 6)  Pen Needles 5/16" 31g X 8 Mm Misc (Insulin pen needle) .... Pt test 1 x a day 7)  Humalog Pen 100 Unit/ml Soln (Insulin lispro (human)) .... 5 units three times a day 8)  Celexa 20 Mg Tabs (Citalopram hydrobromide) .... 1/2 by mouth once daily for 1 week then 1 by mouth once daily 9)  Pravachol 40 Mg Tabs (Pravastatin sodium) .[name].. 1 by mouth at bedtime. 10)  Precision Qid Test Strp (Glucose blood) .... Use as directed. 11)  Vitamin E  12)  Stress Tab  13)  Ultram 50 Mg Tabs (Tramadol hcl) .[name].. 1 -2 by mouth every 6 hours as needed Prescriptions: ULTRAM 50 MG TABS (TRAMADOL HCL) 1 -2 by mouth every 6 hours as needed  #60 x 0   Entered and Authorized by:   Loreen Freud DO   Signed by:   Loreen Freud DO on 10/13/2009   Method used:   Electronically to        [id number]* (retail)       [address]       [place], [place]  [address]       Ph: [phone] or [phone]       Fax: [phone]   RxID:   [id number]

## 2010-11-07 NOTE — Progress Notes (Signed)
Summary: pt wants samples  Phone Note Call from Patient Call back at Home Phone [phone]   Caller: Patient Summary of Call: patient needs more samples of lantus & humalog  Initial call taken by: Okey Regal Spring,  April 07, 2010 9:01 AM  Follow-up for Phone Call        Patient called and was given samples of lantus and humalog. Follow-up by: Barnie Mort,  April 07, 2010 3:57 PM

## 2010-11-07 NOTE — Assessment & Plan Note (Signed)
Summary: review [facility]/cbs   Vital Signs:  Patient profile:   65 year old female Weight:      167 pounds Pulse rate:   80 / minute Pulse rhythm:   regular BP sitting:   130 / 70  (left arm) Cuff size:   regular  Vitals Entered By: Almeta Monas CMA Duncan Dull) (September 14, 2010 10:49 AM) CC: Review [facility]   History of Present Illness: pt here to review labs only.  Current Medications (verified): 1)  Lotrel 10-20 Mg Caps (Amlodipine Besy-Benazepril Hcl) .[name].. 1 Once Daily 2)  Aspirin 81 Mg Tbec (Aspirin) .[name].. 1 Once Daily 3)  Lovaza 1 Gm Caps (Omega-3-Acid Ethyl Esters) .... 2 By Mouth Two Times A Day 4)  Lantus Solostar 100 Unit/ml Soln (Insulin Glargine) .... 45 U Subcutaneously Qpm 5)  Toprol Xl 50 Mg Xr24h-Tab (Metoprolol Succinate) .... One Tablet By Mouth Once Daily 6)  Pen Needles 5/16" 31g X 8 Mm Misc (Insulin Pen Needle) .... Pt Test 1 X A Day 7)  Humalog Pen 100 Unit/ml Soln (Insulin Lispro (Human)) .... 5 Units Three Times A Day 8)  Celexa 20 Mg Tabs (Citalopram Hydrobromide) .... 1/2 By Mouth Once Daily For 1 Week Then 1 By Mouth Once Daily 9)  Lipitor 20 Mg Tabs (Atorvastatin Calcium) .[name].. 1 By Mouth At Bedtime 10)  Precision Qid Test  Strp (Glucose Blood) .... Use As Directed. 11)  Vitamin E 12)  Stress Tab 13)  Ultram 50 Mg Tabs (Tramadol Hcl) .[name].. 1 -2 By Mouth Every 6 Hours As Needed 14)  Citalopram Hydrobromide 20 Mg Tabs (Citalopram Hydrobromide) .... Take As Directed 15)  Diflucan 150 Mg Tabs (Fluconazole) .[name].. 1 By Mouth Once Daily X1, May Repeat 3 Days As Needed 16)  Apidra Solostar 100 Unit/ml Soln (Insulin Glulisine) .[name].. 10 U Subcutaneously Before Each Meal  Allergies (verified): 1)  ! * Levermin Insulin  Past History:  Past Medical History: Last updated: 10/07/2008 Diabetes mellitus, type II Hypertension High Cholesterol GOITER, UNSPECIFIED (ICD-240.9) DEPRESSION, CHRONIC (ICD-311) RESTLESS LEG SYNDROME, HX OF  (ICD-V12.49) Hyperlipidemia  Past Surgical History: Last updated: 03/29/2008 Current Problems:  ROTATOR CUFF REPAIR, RIGHT, HX OF (ICD-V45.89) RASH-NONVESICULAR (ICD-782.1) RINGWORM (ICD-110.9) ABDOMINAL PAIN, LEFT LOWER QUADRANT (ICD-789.04) FINGER PAIN (ICD-729.5) GOITER, MULTINODULAR (ICD-241.1) DEPRESSION, CHRONIC (ICD-311) RESTLESS LEG SYNDROME, HX OF (ICD-V12.49) PREVENTIVE HEALTH CARE (ICD-V70.0) HIP PAIN, RIGHT (ICD-719.45) HYPERTENSION (ICD-401.9) DIABETES MELLITUS, TYPE II (ICD-250.00) Tubal ligation  Family History: Last updated: 03/25/2009 Family History Lung cancer Family History Other cancer-Breast,Thyroid Family History Uterine cancer daughter has benign nodules in thyroid No FH of Colon Cancer:  Social History: Last updated: 03/25/2009 Married 5 children 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  pt stopped 3 weeks ago  Daily Caffeine Use: 3 cups daily  Illicit Drug Use - no Patient does not get regular exercise.   Risk Factors: Caffeine Use: 5+ - cut down on caffeine (08/18/2009) Exercise: no (08/18/2009)  Risk Factors: Smoking Status: quit (08/18/2009) Packs/Day: 1/2 (08/18/2009) Passive Smoke Exposure: yes (08/18/2009)  Family History: Reviewed history from 03/25/2009 and no changes required. Family History Lung cancer Family History Other cancer-Breast,Thyroid Family History Uterine cancer daughter has benign nodules in thyroid No FH of Colon Cancer:  Social History: Reviewed history from 03/25/2009 and no changes required. 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  pt stopped 3 weeks ago  Daily Caffeine Use: 3 cups daily  Illicit Drug  Use - no Patient does not get regular exercise.   Review of Systems      See HPI  Physical Exam  General:  Well-developed,well-nourished,in no acute distress; alert,appropriate and cooperative throughout  examination Psych:  Cognition and judgment appear intact. Alert and cooperative with normal attention span and concentration. No apparent delusions, illusions, hallucinations   Impression & Recommendations:  Problem # 1:  HYPERLIPIDEMIA (ICD-272.4)  Her updated medication list for this problem includes:    Lovaza 1 Gm Caps (Omega-3-acid ethyl esters) .[name]... 2 by mouth two times a day    Lipitor 20 Mg Tabs (Atorvastatin calcium) .[name]... 1 by mouth at bedtime  Labs Reviewed: SGOT: 21 (08/18/2009)   SGPT: 16 (08/18/2009)   HDL:29.70 (08/18/2009), 34.10 (03/29/2009)  LDL:116 (08/18/2009), 114 (03/29/2009)  Chol:180 (08/18/2009), 172 (03/29/2009)  Trig:171.0 (08/18/2009), 122.0 (03/29/2009)  Orders: Prescription Created Electronically [phone])  Problem # 2:  HYPERTENSION (ICD-401.9)  Her updated medication list for this problem includes:    Lotrel 10-20 Mg Caps (Amlodipine besy-benazepril hcl) .[name]... 1 once daily    Toprol Xl 50 Mg Xr24h-tab (Metoprolol succinate) ..... One tablet by mouth once daily  BP today: 130/70 Prior BP: 130/70 (08/14/2010)  Labs Reviewed: K+: 3.5 (08/18/2009) Creat: : 0.8 (08/18/2009)   Chol: 180 (08/18/2009)   HDL: 29.70 (08/18/2009)   LDL: 116 (08/18/2009)   TG: 171.0 (08/18/2009)  Problem # 3:  DIABETES MELLITUS, TYPE II (ICD-250.00)  Her updated medication list for this problem includes:    Lotrel 10-20 Mg Caps (Amlodipine besy-benazepril hcl) .[name]... 1 once daily    Aspirin 81 Mg Tbec (Aspirin) .[name]... 1 once daily    Lantus Solostar 100 Unit/ml Soln (Insulin glargine) .Marland KitchenMarland KitchenMarland KitchenMarland Kitchen 45 u subcutaneously qpm    Humalog Pen 100 Unit/ml Soln (Insulin lispro (human)) .Marland KitchenMarland KitchenMarland KitchenMarland Kitchen 5 units three times a day    Apidra Solostar 100 Unit/ml Soln (Insulin glulisine) .Marland KitchenMarland KitchenMarland KitchenMarland Kitchen 10 u subcutaneously before each meal  Orders: Endocrinology Referral (Endocrine)  Labs Reviewed: Creat: 0.8 (08/18/2009)    Reviewed HgBA1c results: 9.3 (08/18/2009)  11.7 (03/21/2009)  Complete Medication  List: 1)  Lotrel 10-20 Mg Caps (Amlodipine besy-benazepril hcl) .[name].. 1 once daily 2)  Aspirin 81 Mg Tbec (Aspirin) .[name].. 1 once daily 3)  Lovaza 1 Gm Caps (Omega-3-acid ethyl esters) .... 2 by mouth two times a day 4)  Lantus Solostar 100 Unit/ml Soln (Insulin glargine) .... 45 u subcutaneously qpm 5)  Toprol Xl 50 Mg Xr24h-tab (Metoprolol succinate) .... One tablet by mouth once daily 6)  Pen Needles 5/16" 31g X 8 Mm Misc (Insulin pen needle) .... Pt test 1 x a day 7)  Humalog Pen 100 Unit/ml Soln (Insulin lispro (human)) .... 5 units three times a day 8)  Celexa 20 Mg Tabs (Citalopram hydrobromide) .... 1/2 by mouth once daily for 1 week then 1 by mouth once daily 9)  Lipitor 20 Mg Tabs (Atorvastatin calcium) .[name].. 1 by mouth at bedtime 10)  Precision Qid Test Strp (Glucose blood) .... Use as directed. 11)  Vitamin E  12)  Stress Tab  13)  Ultram 50 Mg Tabs (Tramadol hcl) .[name].. 1 -2 by mouth every 6 hours as needed 14)  Citalopram Hydrobromide 20 Mg Tabs (Citalopram hydrobromide) .... Take as directed 15)  Diflucan 150 Mg Tabs (Fluconazole) .[name].. 1 by mouth once daily x1, may repeat 3 days as needed 16)  Apidra Solostar 100 Unit/ml Soln (Insulin glulisine) .[name].. 10 u subcutaneously before each meal  Patient Instructions: 1)  272.4  250.00  [facility]-----3 months Prescriptions: APIDRA SOLOSTAR 100 UNIT/ML SOLN (INSULIN GLULISINE) 10 u Subcutaneously before each meal  #1 x 2   Entered and Authorized by:   Loreen Freud DO   Signed by:   Loreen Freud DO on 09/14/2010   Method used:   Electronically to        [id number]* (retail)       [address]       [place], [place]  [address]       Ph: [phone] or [phone]       Fax: [phone]   RxID:   [id number] LIPITOR 20 MG TABS (ATORVASTATIN CALCIUM) 1 by mouth at bedtime  #30 x 2   Entered and Authorized by:   Loreen Freud DO   Signed by:   Loreen Freud DO on 09/14/2010   Method used:    Electronically to        [id number]* (retail)       [address]       [place], [place]  [address]       Ph: [phone] or [phone]       Fax: [phone]   RxID:   [id number]    Orders Added: 1)  Endocrinology Referral [Endocrine] 2)  Est. Patient Level III [[address]] 3)  Prescription Created Electronically [phone]

## 2010-11-07 NOTE — Assessment & Plan Note (Signed)
Summary: RASH ON NECK X 4-5WEEKS/SCM   Vital Signs:  Patient Profile:   65 Years Old Female Height:     64.25 inches Weight:      171.8 pounds Temp:     98.1 degrees F oral Pulse rate:   76 / minute BP sitting:   126 / 82  (left arm) Cuff size:   large  Vitals Entered By: Shonna Chock (Mar 04, 2008 12:53 PM)                 PCP:  [facility]  Chief Complaint:  rash on neck x 4-5 weeks, refill celexa (local pharmacy), and Rash.  History of Present Illness:  Rash      This is a 65 year old woman who presents with Rash.  Rash on neck x 4-5 weeks.  The patient reports itching and scaling.  The rash is located on the neck.  The rash is worse with heat.  The patient denies the following symptoms: fever and headache.  The patient denies history of recent tick bite, recent antibiotic use, new medication, new clothing, recent travel, and pet/animal contact.      Current Allergies (reviewed today): No known allergies   Past Medical History:    Reviewed history from 02/13/2008 and no changes required:       Diabetes mellitus, type II       Hypertension       High Cholesterol       GOITER, UNSPECIFIED (ICD-240.9)       DEPRESSION, CHRONIC (ICD-311)       RESTLESS LEG SYNDROME, HX OF (ICD-V12.49)           Risk Factors: Tobacco use:  current    Year started:  2005    Cigarettes:  Yes -- 1/2 pack(s) per day Drug use:  no Alcohol use:  no Exercise:  no Seatbelt use:  50 %  Family History Risk Factors:    Family History of MI in females < 53 years old:  no    Family History of MI in males < 46 years old:  yes  Colonoscopy History:    Date of Last Colonoscopy:  04/23/2007  Mammogram History:    Date of Last Mammogram:  03/27/2007   Review of Systems  General      Denies chills and fever.  Derm      Complains of itching and rash.   Physical Exam  General:     Well-developed,well-nourished,in no acute distress; alert,appropriate and cooperative throughout  examination Neck:     annular, erythmatous regions x 2 on left posterior cervical region. No exudate. Cervical Nodes:     No lymphadenopathy noted    Impression & Recommendations:  Problem # 1:  RINGWORM (ICD-110.9)  Problem # 2:  DEPRESSION, CHRONIC (ICD-311)  Her updated medication list for this problem includes:    Celexa 20 Mg Tabs (Citalopram hydrobromide) .[name]... 1 by mouth once daily   Complete Medication List: 1)  Lotrel 10-20 Mg Caps (Amlodipine besy-benazepril hcl) .[name].. 1 once daily 2)  Glucophage 1000 Mg Tabs (Metformin hcl) .... Two times a day 3)  Crestor 20 Mg Tabs (Rosuvastatin calcium) .[name].. 1 once daily 4)  Calcium 600 1500 Mg Tabs (Calcium carbonate) .[name].. 1 two times a day 5)  Amaryl 4 Mg Tabs (Glimepiride) .... 2 once daily 6)  Aspirin 81 Mg Tbec (Aspirin) .[name].. 1 once daily 7)  Precose 25 Mg Tabs (Acarbose) .[name].. 1 by mouth two times a day  8)  Prandin 2 Mg Tabs (Repaglinide) .... 2 by mouth two times a day 9)  Celexa 20 Mg Tabs (Citalopram hydrobromide) .[name].. 1 by mouth once daily 10)  Celebrex 200 Mg Caps (Celecoxib) .[name].. 1 by mouth once daily 11)  Darvocet-n 100 100-650 Mg Tabs (Propoxyphene n-apap) .[name].. 1 by mouth every 4-6 hours as needed 12)  Colchicine 0.6 Mg Tabs (Colchicine) .[name].. 1 by mouth every 3 hours prn 13)  Ketoconazole 2 % Crea (Ketoconazole) .... Apply once daily x 2 weeks or until area heals   Patient Instructions: 1)  Take medication as precribed.   Prescriptions: KETOCONAZOLE 2 %  CREA (KETOCONAZOLE) apply once daily x 2 weeks or until area heals  #8 oz x 0   Entered and Authorized by:   Cordelia Pen FNP   Signed by:   Cordelia Pen FNP on 03/04/2008   Method used:   Electronically sent to ...       [facility]  [address] [id number]       [address]       [place], [place]  [address]       Ph: [phone] or [phone]       Fax: [phone]   RxID:   [id number]  ]

## 2010-11-07 NOTE — Assessment & Plan Note (Signed)
Summary: vag discharge, med refill, sore on wrist///sph   Vital Signs:  Patient profile:   65 year old female Height:      64.25 inches Weight:      167.6 pounds BMI:     28.65 Temp:     97.8 degrees F oral BP sitting:   130 / 70  (left arm) Cuff size:   regular  Vitals Entered By: Almeta Monas CMA Duncan Dull) (August 14, 2010 10:23 AM) CC: x31mo c/o vag d/c, burning sensation to the tongue, and swelling to the left wrist, Vaginal discharge CBG Result 382   History of Present Illness:  Vaginal discharge      This is a 65 year old woman who presents with Vaginal discharge.  The symptoms began 4 weeks ago.  The pt has not been consistant with her insulin---has not taken it in several days and is out.  The patient complains of itching and vaginal burning, but denies burning on urination, frequency, urgency, fever, pelvic pain, and back pain.  The discharge is described as white.  Risk factors for vaginal discharge include diabetes.  The patient denies the following symptoms: genital sores, unusual vaginal bleeding, painful intercourse, rash, myalgias, arthralgias, and headache.  Prior treatment has included no prior treatment.    Current Medications (verified): 1)  Lotrel 10-20 Mg Caps (Amlodipine Besy-Benazepril Hcl) .[name].. 1 Once Daily 2)  Aspirin 81 Mg Tbec (Aspirin) .[name].. 1 Once Daily 3)  Lovaza 1 Gm Caps (Omega-3-Acid Ethyl Esters) .... 2 By Mouth Two Times A Day 4)  Lantus Solostar 100 Unit/ml Soln (Insulin Glargine) .... 45 U Subcutaneously Qpm 5)  Toprol Xl 50 Mg Xr24h-Tab (Metoprolol Succinate) .... One Tablet By Mouth Once Daily 6)  Pen Needles 5/16" 31g X 8 Mm Misc (Insulin Pen Needle) .... Pt Test 1 X A Day 7)  Humalog Pen 100 Unit/ml Soln (Insulin Lispro (Human)) .... 5 Units Three Times A Day 8)  Celexa 20 Mg Tabs (Citalopram Hydrobromide) .... 1/2 By Mouth Once Daily For 1 Week Then 1 By Mouth Once Daily 9)  Pravachol 40 Mg Tabs (Pravastatin Sodium) .[name].. 1 By Mouth At  Bedtime. 10)  Precision Qid Test  Strp (Glucose Blood) .... Use As Directed. 11)  Vitamin E 12)  Stress Tab 13)  Ultram 50 Mg Tabs (Tramadol Hcl) .[name].. 1 -2 By Mouth Every 6 Hours As Needed 14)  Citalopram Hydrobromide 20 Mg Tabs (Citalopram Hydrobromide) .... Take As Directed 15)  Diflucan 150 Mg Tabs (Fluconazole) .[name].. 1 By Mouth Once Daily X1, May Repeat 3 Days As Needed  Allergies (verified): 1)  ! 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  pt stopped 3 weeks ago  Daily Caffeine Use: 3 cups daily  Illicit Drug Use - no Patient does not get regular exercise.   Risk Factors: Caffeine Use: 5+ - cut down on caffeine (08/18/2009) Exercise: no (08/18/2009)  Risk Factors: Smoking Status: quit (08/18/2009) Packs/Day: 1/2 (08/18/2009) Passive Smoke Exposure: yes (08/18/2009)  Past medical, surgical, family and social histories (including risk factors) reviewed for relevance to current acute and chronic problems.  Past Medical History: Reviewed history from 10/07/2008 and no changes required. Diabetes mellitus, type II Hypertension High Cholesterol GOITER, UNSPECIFIED (  ICD-240.9) DEPRESSION, CHRONIC (ICD-311) RESTLESS LEG SYNDROME, HX OF (ICD-V12.49) Hyperlipidemia  Past Surgical History: Reviewed history from 03/29/2008 and no changes required. Current Problems:  ROTATOR CUFF REPAIR, RIGHT, HX OF (ICD-V45.89) RASH-NONVESICULAR (ICD-782.1) RINGWORM (ICD-110.9) ABDOMINAL PAIN, LEFT LOWER QUADRANT (ICD-789.04) FINGER PAIN (ICD-729.5) GOITER, MULTINODULAR (ICD-241.1) DEPRESSION, CHRONIC (ICD-311) RESTLESS LEG SYNDROME, HX OF  (ICD-V12.49) PREVENTIVE HEALTH CARE (ICD-V70.0) HIP PAIN, RIGHT (ICD-719.45) HYPERTENSION (ICD-401.9) DIABETES MELLITUS, TYPE II (ICD-250.00) Tubal ligation  Family History: Reviewed history from 03/25/2009 and no changes required. Family History Lung cancer Family History Other cancer-Breast,Thyroid Family History Uterine cancer daughter has benign nodules in thyroid No FH of Colon Cancer:  Social History: Reviewed history from 03/25/2009 and no changes required. 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  pt stopped 3 weeks ago  Daily Caffeine Use: 3 cups daily  Illicit Drug Use - no Patient does not get regular exercise.   Review of Systems      See HPI  Physical Exam  General:  Well-developed,well-nourished,in no acute distress; alert,appropriate and cooperative throughout examination Mouth:  + thrush Genitalia:  normal introitus, no external lesions, and vaginal discharge.     Impression & Recommendations:  Problem # 1:  DIABETES MELLITUS, TYPE II (ICD-250.00) discussed with pt importance of taking medication Pt needs f/u with Dr Talmage Nap Her updated medication list for this problem includes:    Lotrel 10-20 Mg Caps (Amlodipine besy-benazepril hcl) .[name]... 1 once daily    Aspirin 81 Mg Tbec (Aspirin) .[name]... 1 once daily    Lantus Solostar 100 Unit/ml Soln (Insulin glargine) .Marland KitchenMarland KitchenMarland KitchenMarland Kitchen 45 u subcutaneously qpm    Humalog Pen 100 Unit/ml Soln (Insulin lispro (human)) .Marland KitchenMarland KitchenMarland KitchenMarland Kitchen 5 units three times a day  Labs Reviewed: Creat: 0.8 (08/18/2009)    Reviewed HgBA1c results: 9.3 (08/18/2009)  11.7 (03/21/2009)  Orders: Glucose, (CBG) (25366)  Problem # 2:  HYPERLIPIDEMIA (ICD-272.4)  Her updated medication list for this problem includes:    Lovaza 1 Gm Caps (Omega-3-acid ethyl esters) .[name]... 2 by mouth two times a day    Pravachol 40 Mg Tabs (Pravastatin sodium) .[name]... 1 by mouth at bedtime.  Labs Reviewed: SGOT: 21 (08/18/2009)    SGPT: 16 (08/18/2009)   HDL:29.70 (08/18/2009), 34.10 (03/29/2009)  LDL:116 (08/18/2009), 114 (03/29/2009)  Chol:180 (08/18/2009), 172 (03/29/2009)  Trig:171.0 (08/18/2009), 122.0 (03/29/2009)  Problem # 3:  HYPERTENSION (ICD-401.9)  Her updated medication list for this problem includes:    Lotrel 10-20 Mg Caps (Amlodipine besy-benazepril hcl) .[name]... 1 once daily    Toprol Xl 50 Mg Xr24h-tab (Metoprolol succinate) ..... One tablet by mouth once daily  BP today: 130/70 Prior BP: 126/72 (10/13/2009)  Labs Reviewed: K+: 3.5 (08/18/2009) Creat: : 0.8 (08/18/2009)   Chol: 180 (08/18/2009)   HDL: 29.70 (08/18/2009)   LDL: 116 (08/18/2009)   TG: 171.0 (08/18/2009)  Complete Medication List: 1)  Lotrel 10-20 Mg Caps (Amlodipine besy-benazepril hcl) .[name].. 1 once daily 2)  Aspirin 81 Mg Tbec (Aspirin) .[name].. 1 once daily 3)  Lovaza 1 Gm Caps (Omega-3-acid ethyl esters) .... 2 by mouth two times a day 4)  Lantus Solostar 100 Unit/ml Soln (Insulin glargine) .... 45 u subcutaneously qpm 5)  Toprol Xl 50 Mg Xr24h-tab (Metoprolol succinate) .... One tablet by mouth once daily 6)  Pen Needles 5/16" 31g X 8 Mm Misc (Insulin pen needle) .... Pt test 1 x a day 7)  Humalog Pen 100 Unit/ml Soln (Insulin lispro (human)) .... 5 units three times a day 8)  Celexa 20 Mg Tabs (Citalopram hydrobromide) .... 1/2 by mouth once daily for 1 week then 1 by mouth once daily 9)  Pravachol 40 Mg Tabs (Pravastatin sodium) .[name].. 1 by mouth at bedtime. 10)  Precision Qid Test Strp (Glucose blood) .... Use as directed. 11)  Vitamin E  12)  Stress Tab  13)  Ultram 50 Mg Tabs (Tramadol hcl) .[name].. 1 -2 by mouth every 6 hours as needed 14)  Citalopram Hydrobromide 20 Mg Tabs (Citalopram hydrobromide) .... Take as directed 15)  Diflucan 150 Mg Tabs (Fluconazole) .[name].. 1 by mouth once daily x1, may repeat 3 days as needed  Other Orders: TLB-Wet [place] / Fungus (87210-WPREP)  Patient Instructions: 1)  RTO fasting labs [facility]  labs 250.00  272.4   ---ov 3 weeks after labs Prescriptions: DIFLUCAN 150 MG TABS (FLUCONAZOLE) 1 by mouth once daily x1, may repeat 3 days as needed  #2 x 2   Entered and Authorized by:   Loreen Freud DO   Signed by:   Loreen Freud DO on 08/14/2010   Method used:   Electronically to        [id number]* (retail)       [address]       [place], [place]  [address]       Ph: [phone] or [phone]       Fax: [phone]   RxID:   [id number] PEN NEEDLES 5/16" 31G X 8 MM MISC (INSULIN PEN NEEDLE) PT TEST 1 X A DAY  #100 x 3   Entered and Authorized by:   Loreen Freud DO   Signed by:   Loreen Freud DO on 08/14/2010   Method used:   Electronically to        [id number]* (retail)       [address]       [place], [place]  [address]       Ph: [phone] or [phone]       Fax: [phone]   RxID:   [id number] HUMALOG PEN 100 UNIT/ML SOLN (INSULIN LISPRO (HUMAN)) 5 units three times a day  #1 month x 2   Entered and Authorized by:   Loreen Freud DO   Signed by:   Loreen Freud DO on 08/14/2010   Method used:   Electronically to        [id number]* (retail)       [address]       [place], [place]  [address]       Ph: [phone] or [phone]       Fax: [phone]   RxID:   [id number] TOPROL XL 50 MG XR24H-TAB (METOPROLOL SUCCINATE) one tablet by mouth once daily  #30 Tablet x 3   Entered and Authorized by:   Loreen Freud DO   Signed by:   Loreen Freud DO on 08/14/2010   Method used:   Electronically to        [id number]* (retail)       [address]       [place], [place]  [address]       Ph: [phone] or [phone]       Fax: [phone]   RxID:  [id number] LANTUS SOLOSTAR 100 UNIT/ML SOLN (INSULIN GLARGINE) 45 u Subcutaneously qpm  #1 months x 3   Entered and Authorized by:   Loreen Freud DO   Signed by:   Loreen Freud DO on 08/14/2010   Method used:   Electronically to        [id number]* (retail)       [address]       [place], [place]  [address]       Ph: [phone] or [phone]       Fax: [phone]   RxID:   [id number] LOTREL 10-20 MG CAPS (AMLODIPINE BESY-BENAZEPRIL HCL) 1 once daily  #30 Capsule x 2   Entered and Authorized by:   Loreen Freud DO   Signed by:   Loreen Freud DO on 08/14/2010   Method used:   Electronically to        [id number]* (retail)       [address]       [place], [place]  [address]       Ph: [phone] or [phone]       Fax: [phone]   RxID:   [id number]    Orders Added: 1)  Est. Patient Level IV [62831] 2)  Glucose, (CBG) [82962] 3)  TLB-Wet Mount / Fungus [87210-WPREP]    Laboratory Results   Blood Tests     CBG Random:: 382mg /dL

## 2010-11-07 NOTE — Letter (Signed)
Summary: Primary Care Appointment Letter  [facility]  [address] [place], [place] [address]   Phone: [phone]  Fax: [phone]    06/13/2010 MRN: [id number]  [facility] [address] [place], [place]  [address]  Dear Ms. Hesse,   Your Primary Care Physician Loreen Freud DO has indicated that:    ___X___it is time to schedule an appointment.    _______you missed your appointment on______ and need to call and          reschedule.    __X_____you need to have lab work done.    _______you need to schedule an appointment discuss lab or test results.    _______you need to call to reschedule your appointment that is                       scheduled on _________.     Please call our office as soon as possible. Our phone number is [phone]. Please press option 1. Our office is open 8a-12noon and 1p-5p, Monday through Friday.     Thank you,    [facility]

## 2010-11-07 NOTE — Letter (Signed)
Summary: Results Follow-up Letter  [facility]  [address] [place], [place] [address]   Phone: [phone]  Fax: [phone]    08/17/2010  [address] [place], [place]  [address]  Dear [name],   The following are the results of your recent test(s):  Test     Result     Pap Smear    Normal___X____  Not Normal_____       Comments: _________________________________________________________ Cholesterol LDL(Bad cholesterol):          Your goal is less than:         HDL (Good cholesterol):        Your goal is more than: _________________________________________________________ Other Tests:   _________________________________________________________  Please call for an appointment Or _________________________________________________________ _________________________________________________________ _________________________________________________________  Sincerely,  Harold Barban [place] [facility]

## 2010-11-07 NOTE — Progress Notes (Signed)
Summary: RX  Phone Note Call from Patient Call back at [facility] Phone [phone]   Caller: Patient Reason for Call: Refill Medication Summary of Call: PT IS CALL FOR SAMPLES OF  HUMALOG  SAMPLES IF WE HAVE ANY. NEED THIS TODAY . PHARM- IS AT [address] Initial call taken by: Freddy Jaksch,  May 04, 2010 3:24 PM    Prescriptions: HUMALOG PEN 100 UNIT/ML SOLN (INSULIN LISPRO (HUMAN)) 5 units three times a day  #1 month x 0   Entered by:   Jeremy Johann CMA   Authorized by:   Loreen Freud DO   Signed by:   Jeremy Johann CMA on 05/04/2010   Method used:   Faxed to ...       [facility]  [address] [id number] (retail)       [address]       [place], [place]  [address]       Ph: [phone] or [phone]       Fax: [phone]   RxID:   [id number]

## 2010-11-09 NOTE — Assessment & Plan Note (Signed)
Summary: right shoullder still painful--visit on 1/10 says to return i...   Vital Signs:  Patient profile:   65 year old female Weight:      169.6 pounds BMI:     28.99 Pulse rate:   80 / minute Pulse rhythm:   regular BP sitting:   124 / 66  (left arm) Cuff size:   large  Vitals Entered By: Almeta Monas CMA Duncan Dull) (October 23, 2010 11:02 AM) CC: still having the right shoulder pain now radiating to her neck   History of Present Illness: Pt here c/o R shoulder pain and neck pain since this am.  No known injury.  Pain starts in shoulder and radiates to base of skull.   --- see last visit.  Muscle relaxers help but she is out of meds.  Pt has not seen PT yet.     Current Medications (verified): 1)  Lotrel 10-20 Mg Caps (Amlodipine Besy-Benazepril Hcl) .[name].. 1 Once Daily 2)  Aspirin 81 Mg Tbec (Aspirin) .[name].. 1 Once Daily 3)  Lovaza 1 Gm Caps (Omega-3-Acid Ethyl Esters) .... 2 By Mouth Two Times A Day 4)  Lantus Solostar 100 Unit/ml Soln (Insulin Glargine) .... 50 U Subcutaneously Qpm 5)  Toprol Xl 50 Mg Xr24h-Tab (Metoprolol Succinate) .... One Tablet By Mouth Once Daily 6)  Pen Needles 5/16" 31g X 8 Mm Misc (Insulin Pen Needle) .... Pt Test 1 X A Day 7)  Humalog Pen 100 Unit/ml Soln (Insulin Lispro (Human)) .... 7 U Subcutaneously Three Times A Day 8)  Celexa 20 Mg Tabs (Citalopram Hydrobromide) .... 1/2 By Mouth Once Daily For 1 Week Then 1 By Mouth Once Daily 9)  Lipitor 20 Mg Tabs (Atorvastatin Calcium) .[name].. 1 By Mouth At Bedtime 10)  Precision Qid Test  Strp (Glucose Blood) .... Use As Directed. 11)  Vitamin E 12)  Stress Tab 13)  Ultram 50 Mg Tabs (Tramadol Hcl) .[name].. 1 -2 By Mouth Every 6 Hours As Needed 14)  Citalopram Hydrobromide 20 Mg Tabs (Citalopram Hydrobromide) .... Take As Directed 15)  Diflucan 150 Mg Tabs (Fluconazole) .[name].. 1 By Mouth Once Daily X1, May Repeat 3 Days As Needed 16)  Apidra 100 Unit/ml Soln (Insulin Glulisine) .... Take 10 U Subcutaneously Before Each  Meal 17)  Bd Insulin Syringe 31g X 5/16" 0.3 Ml Misc (Insulin Syringe-Needle U-100) .[name].. 10 U Subcutaneously Before Each Meal 18)  Flexeril 10 Mg Tabs (Cyclobenzaprine Hcl) .[name].. 1 By Mouth Three Times A Day  Allergies (verified): 1)  ! * Levermin Insulin  Past History:  Past Medical History: Last updated: 10/07/2008 Diabetes mellitus, type II Hypertension High Cholesterol GOITER, UNSPECIFIED (ICD-240.9) DEPRESSION, CHRONIC (ICD-311) RESTLESS LEG SYNDROME, HX OF (ICD-V12.49) Hyperlipidemia  Past Surgical History: Last updated: 03/29/2008 Current Problems:  ROTATOR CUFF REPAIR, RIGHT, HX OF (ICD-V45.89) RASH-NONVESICULAR (ICD-782.1) RINGWORM (ICD-110.9) ABDOMINAL PAIN, LEFT LOWER QUADRANT (ICD-789.04) FINGER PAIN (ICD-729.5) GOITER, MULTINODULAR (ICD-241.1) DEPRESSION, CHRONIC (ICD-311) RESTLESS LEG SYNDROME, HX OF (ICD-V12.49) PREVENTIVE HEALTH CARE (ICD-V70.0) HIP PAIN, RIGHT (ICD-719.45) HYPERTENSION (ICD-401.9) DIABETES MELLITUS, TYPE II (ICD-250.00) Tubal ligation  Family History: Last updated: 03/25/2009 Family History Lung cancer Family History Other cancer-Breast,Thyroid Family History Uterine cancer daughter has benign nodules in thyroid No FH of Colon Cancer:  Social History: Last updated: 03/25/2009 Married 5 children 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  pt stopped 3 weeks ago  Daily Caffeine Use: 3 cups daily  Illicit Drug Use - no Patient does not get regular exercise.   Risk  Factors: Caffeine Use: 5+ - cut down on caffeine (08/18/2009) Exercise: no (08/18/2009)  Risk Factors: Smoking Status: quit (08/18/2009) Packs/Day: 1/2 (08/18/2009) Passive Smoke Exposure: yes (08/18/2009)  Family History: Reviewed history from 03/25/2009 and no changes required. Family History Lung cancer Family History Other cancer-Breast,Thyroid Family History Uterine cancer daughter has benign nodules in thyroid No FH of Colon  Cancer:  Social History: Reviewed history from 03/25/2009 and no changes required. 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  pt stopped 3 weeks ago  Daily Caffeine Use: 3 cups daily  Illicit Drug Use - no Patient does not get regular exercise.   Review of Systems      See HPI  Physical Exam  General:  Well-developed,well-nourished,in no acute distress; alert,appropriate and cooperative throughout examination Msk:  + tenderness R trap no dec rom Psych:  Cognition and judgment appear intact. Alert and cooperative with normal attention span and concentration. No apparent delusions, illusions, hallucinations   Impression & Recommendations:  Problem # 1:  SHOULDER PAIN, RIGHT (ICD-719.41) Assessment Improved Pt will go to PT-- if no improvement--- will refer to ortho Her updated medication list for this problem includes:    Aspirin 81 Mg Tbec (Aspirin) .[name]... 1 once daily    Ultram 50 Mg Tabs (Tramadol hcl) .[name]... 1 -2 by mouth every 6 hours as needed    Flexeril 10 Mg Tabs (Cyclobenzaprine hcl) .[name]... 1 by mouth three times a day    Hydrocodone-acetaminophen 5-325 Mg Tabs (Hydrocodone-acetaminophen) .[name]... 1-2 by mouth every 6 hours as needed  Discussed shoulder exercises, use of moist heat or ice, and medication.   Complete Medication List: 1)  Lotrel 10-20 Mg Caps (Amlodipine besy-benazepril hcl) .[name].. 1 once daily 2)  Aspirin 81 Mg Tbec (Aspirin) .[name].. 1 once daily 3)  Lovaza 1 Gm Caps (Omega-3-acid ethyl esters) .... 2 by mouth two times a day 4)  Lantus Solostar 100 Unit/ml Soln (Insulin glargine) .... 50 u subcutaneously qpm 5)  Toprol Xl 50 Mg Xr24h-tab (Metoprolol succinate) .... One tablet by mouth once daily 6)  Pen Needles 5/16" 31g X 8 Mm Misc (Insulin pen needle) .... Pt test 1 x a day 7)  Humalog Pen 100 Unit/ml Soln (Insulin lispro (human)) .... 7 u subcutaneously three times a day 8)  Celexa 20 Mg Tabs (Citalopram  hydrobromide) .... 1/2 by mouth once daily for 1 week then 1 by mouth once daily 9)  Lipitor 20 Mg Tabs (Atorvastatin calcium) .[name].. 1 by mouth at bedtime 10)  Precision Qid Test Strp (Glucose blood) .... Use as directed. 11)  Vitamin E  12)  Stress Tab  13)  Ultram 50 Mg Tabs (Tramadol hcl) .[name].. 1 -2 by mouth every 6 hours as needed 14)  Citalopram Hydrobromide 20 Mg Tabs (Citalopram hydrobromide) .... Take as directed 15)  Diflucan 150 Mg Tabs (Fluconazole) .[name].. 1 by mouth once daily x1, may repeat 3 days as needed 16)  Apidra 100 Unit/ml Soln (Insulin glulisine) .... Take 10 u subcutaneously before each meal 17)  Bd Insulin Syringe 31g X 5/16" 0.3 Ml Misc (Insulin syringe-needle u-100) .[name].. 10 u subcutaneously before each meal 18)  Flexeril 10 Mg Tabs (Cyclobenzaprine hcl) .[name].. 1 by mouth three times a day 19)  Hydrocodone-acetaminophen 5-325 Mg Tabs (Hydrocodone-acetaminophen) .[name].. 1-2 by mouth every 6 hours as needed Prescriptions: FLEXERIL 10 MG TABS (CYCLOBENZAPRINE HCL) 1 by mouth three times a day  #30 x 1   Entered and Authorized  by:   Loreen Freud DO   Signed by:   Loreen Freud DO on 10/23/2010   Method used:   Electronically to        [id number]* (retail)       [address]       [place], [place]  [address]       Ph: [phone] or [phone]       Fax: [phone]   RxID:   [id number]    Orders Added: 1)  Est. Patient Level III [88416]

## 2010-11-09 NOTE — Medication Information (Addendum)
Summary: Formulary Letter Regarding Cyclobenzaprine/[employer]  Formulary Letter Regarding Cyclobenzaprine/[employer]   Imported By: Lanelle Bal 11/03/2010 08:55:08  _____________________________________________________________________  External Attachment:    Type:   Image     Comment:   External Document  Appended Document: Formulary Letter Regarding Cyclobenzaprine/[employer] They will have to let us know what alternatives are.     Appended Document: Formulary Letter Regarding Cyclobenzaprine/[employer] Left message to call back  Appended Document: Formulary Letter Regarding Cyclobenzaprine/[employer] Printed and given to [facility]

## 2010-11-09 NOTE — Assessment & Plan Note (Signed)
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  Pain started Tuesday and it progressed and became so bad she went to ER Friday night.   Pt given hydrocodone by ER and told to f/u here.  See ER report.    Current Medications (verified): 1)  Lotrel 10-20 Mg Caps (Amlodipine Besy-Benazepril Hcl) .[name].. 1 Once Daily 2)  Aspirin 81 Mg Tbec (Aspirin) .[name].. 1 Once Daily 3)  Lovaza 1 Gm Caps (Omega-3-Acid Ethyl Esters) .... 2 By Mouth Two Times A Day 4)  Lantus Solostar 100 Unit/ml Soln (Insulin Glargine) .... 50 U Subcutaneously Qpm 5)  Toprol Xl 50 Mg Xr24h-Tab (Metoprolol Succinate) .... One Tablet By Mouth Once Daily 6)  Pen Needles 5/16" 31g X 8 Mm Misc (Insulin Pen Needle) .... Pt Test 1 X A Day 7)  Humalog Pen 100 Unit/ml Soln (Insulin Lispro (Human)) .... 7 U Subcutaneously Three Times A Day 8)  Celexa 20 Mg Tabs (Citalopram Hydrobromide) .... 1/2 By Mouth Once Daily For 1 Week Then 1 By Mouth Once Daily 9)  Lipitor 20 Mg Tabs (Atorvastatin Calcium) .[name].. 1 By Mouth At Bedtime 10)  Precision Qid Test  Strp (Glucose Blood) .... Use As Directed. 11)  Vitamin E 12)  Stress Tab 13)  Ultram 50 Mg Tabs (Tramadol Hcl) .[name].. 1 -2 By Mouth Every 6 Hours As Needed 14)  Citalopram Hydrobromide 20 Mg Tabs (Citalopram Hydrobromide) .... Take As Directed 15)  Diflucan 150 Mg Tabs (Fluconazole) .[name].. 1 By Mouth Once Daily X1, May Repeat 3 Days As Needed 16)  Apidra 100 Unit/ml Soln (Insulin Glulisine) .... Take 10 U Subcutaneously Before Each Meal 17)  Bd Insulin Syringe 31g X 5/16" 0.3 Ml Misc (Insulin  Syringe-Needle U-100) .[name].. 10 U Subcutaneously Before Each Meal 18)  Flexeril 10 Mg Tabs (Cyclobenzaprine Hcl) .[name].. 1 By Mouth Three Times A Day  Allergies (verified): 1)  ! * Levermin Insulin  Past History:  Past Medical History: Last updated: 10/07/2008 Diabetes mellitus, type II Hypertension High Cholesterol GOITER, UNSPECIFIED (ICD-240.9) DEPRESSION, CHRONIC (ICD-311) RESTLESS LEG SYNDROME, HX OF (ICD-V12.49) Hyperlipidemia  Past Surgical History: Last updated: 03/29/2008 Current Problems:  ROTATOR CUFF REPAIR, RIGHT, HX OF (ICD-V45.89) RASH-NONVESICULAR (ICD-782.1) RINGWORM (ICD-110.9) ABDOMINAL PAIN, LEFT LOWER QUADRANT (ICD-789.04) FINGER PAIN (ICD-729.5) GOITER, MULTINODULAR (ICD-241.1) DEPRESSION, CHRONIC (ICD-311) RESTLESS LEG SYNDROME, HX OF (ICD-V12.49) PREVENTIVE HEALTH CARE (ICD-V70.0) HIP PAIN, RIGHT (ICD-719.45) HYPERTENSION (ICD-401.9) DIABETES MELLITUS, TYPE II (ICD-250.00) Tubal ligation  Family History: Last updated: 03/25/2009 Family History Lung cancer Family History Other cancer-Breast,Thyroid Family History Uterine cancer daughter has benign nodules in thyroid No FH of Colon Cancer:  Social History: Last updated: 03/25/2009 Married 5 children 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  pt stopped 3 weeks ago  Daily Caffeine Use: 3 cups daily  Illicit Drug Use - no Patient does not get regular exercise.   Risk Factors: Caffeine Use: 5+ - cut down on caffeine (08/18/2009) Exercise: no (08/18/2009)  Risk Factors:  Smoking Status: quit (08/18/2009) Packs/Day: 1/2 (08/18/2009) Passive Smoke Exposure: yes (08/18/2009)  Family History: Reviewed history from 03/25/2009 and no changes required. Family History Lung cancer Family History Other cancer-Breast,Thyroid Family History Uterine cancer daughter has benign nodules in thyroid No FH of Colon Cancer:  Social History: Reviewed history from 03/25/2009 and  no changes required. 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  pt stopped 3 weeks ago  Daily Caffeine Use: 3 cups daily  Illicit Drug Use - no Patient does not get regular exercise.   Review of Systems      See HPI  Physical Exam  General:  Well-developed,well-nourished,in no acute distress; alert,appropriate and cooperative throughout examination Abdomen:  Bowel sounds positive,abdomen soft and non-tender without masses, organomegaly or hernias noted. Msk:  R trap tenderness pt is able to move arm with only slight discomfort.   Extremities:  No clubbing, cyanosis, edema, or deformity noted with normal full range of motion of all joints.   Neurologic:  No cranial nerve deficits noted. Station and gait are normal. Plantar reflexes are down-going bilaterally. DTRs are symmetrical throughout. Sensory, motor and coordinative functions appear intact. Psych:  Oriented X3 and normally interactive.     Impression & Recommendations:  Problem # 1:  BACK PAIN, THORACIC REGION, RIGHT (ICD-724.1)  Her updated medication list for this problem includes:    Aspirin 81 Mg Tbec (Aspirin) .[name]... 1 once daily    Ultram 50 Mg Tabs (Tramadol hcl) .[name]... 1 -2 by mouth every 6 hours as needed    Flexeril 10 Mg Tabs (Cyclobenzaprine hcl) .[name]... 1 by mouth three times a day  Discussed use of moist heat or ice, modified activities, medications, and stretching/strengthening exercises. Back care instructions given. To be seen in 2 weeks if no improvement; sooner if worsening of symptoms.   Complete Medication List: 1)  Lotrel 10-20 Mg Caps (Amlodipine besy-benazepril hcl) .[name].. 1 once daily 2)  Aspirin 81 Mg Tbec (Aspirin) .[name].. 1 once daily 3)  Lovaza 1 Gm Caps (Omega-3-acid ethyl esters) .... 2 by mouth two times a day 4)  Lantus Solostar 100 Unit/ml Soln (Insulin glargine) .... 50 u subcutaneously qpm 5)  Toprol Xl 50 Mg Xr24h-tab (Metoprolol succinate) ....  One tablet by mouth once daily 6)  Pen Needles 5/16" 31g X 8 Mm Misc (Insulin pen needle) .... Pt test 1 x a day 7)  Humalog Pen 100 Unit/ml Soln (Insulin lispro (human)) .... 7 u subcutaneously three times a day 8)  Celexa 20 Mg Tabs (Citalopram hydrobromide) .... 1/2 by mouth once daily for 1 week then 1 by mouth once daily 9)  Lipitor 20 Mg Tabs (Atorvastatin calcium) .[name].. 1 by mouth at bedtime 10)  Precision Qid Test Strp (Glucose blood) .... Use as directed. 11)  Vitamin E  12)  Stress Tab  13)  Ultram 50 Mg Tabs (Tramadol hcl) .[name].. 1 -2 by mouth every 6 hours as needed 14)  Citalopram Hydrobromide 20 Mg Tabs (Citalopram hydrobromide) .... Take as directed 15)  Diflucan 150 Mg Tabs (Fluconazole) .[name].. 1 by mouth once daily x1, may repeat 3 days as needed 16)  Apidra 100 Unit/ml Soln (Insulin glulisine) .... Take 10 u subcutaneously before each meal 17)  Bd Insulin Syringe 31g X 5/16" 0.3 Ml Misc (Insulin syringe-needle u-100) .[name].. 10 u subcutaneously before each meal 18)  Flexeril 10 Mg Tabs (Cyclobenzaprine hcl) .[name].. 1 by mouth three times a day  Patient Instructions: 1)  warm compresses ,  shower etc to sprained muscle 2)  massage 3)  increase insulin as described below---call Dr Willeen Cass office for an appointment Prescriptions: FLEXERIL 10 MG TABS (CYCLOBENZAPRINE HCL) 1 by mouth three times a day  #30 x 0   Entered and Authorized by:   Loreen Freud DO   Signed by:   Loreen Freud DO on 10/17/2010   Method used:   Electronically to        [id number] (retail)       [address]       [place], [place]  [address]       Ph: [phone] or [phone]       Fax: [phone]   RxID:   [id number]    Orders Added: 1)  Est. Patient Level III [27253]

## 2010-11-09 NOTE — Progress Notes (Signed)
Summary: Request samples  Phone Note Call from Patient   Caller: Patient Summary of Call: call from patient and she stated she can not get her meds right now and wanted to know if we could give her a sample of humalog and Lantus.C/b [id number].Marland KitchenMarland KitchenMarland KitchenMarland Kitchen we do not have any Humulog but we have plenty of Lantus, would you like to sub the humulog for something else. Initial call taken by: Almeta Monas CMA [facility]),  October 27, 2010 10:56 AM  Follow-up for Phone Call        can do novolog instead---pt needs to call Dr Gilman Schmidt office as well ---she needs to see an Endo.   Follow-up by: Loreen Freud DO,  October 27, 2010 11:53 AM  Additional Follow-up for Phone Call Additional follow up Details #1::        spoke with patient and advised we will give her the Novolg flex pen and Lantus...she voiced understanding. I asked has she seen Dr. Talmage Nap and she stated she has and upcoming appt.... samples left in the fridge.... Additional Follow-up by: Almeta Monas CMA [facility]),  October 27, 2010 2:50 PM

## 2010-11-09 NOTE — Cardiovascular Report (Signed)
Summary: [facility]   Imported By: Lanelle Bal 10/20/2010 08:57:55  _____________________________________________________________________  External Attachment:    Type:   Image     Comment:   External Document

## 2010-11-09 NOTE — Progress Notes (Signed)
    Allergies: 1)  ! * Levermin Insulin   Complete Medication List: 1)  Lotrel 10-20 Mg Caps (Amlodipine besy-benazepril hcl) .[name].. 1 once daily 2)  Aspirin 81 Mg Tbec (Aspirin) .[name].. 1 once daily 3)  Lovaza 1 Gm Caps (Omega-3-acid ethyl esters) .... 2 by mouth two times a day 4)  Lantus Solostar 100 Unit/ml Soln (Insulin glargine) .... 50 u subcutaneously qpm 5)  Toprol Xl 50 Mg Xr24h-tab (Metoprolol succinate) .... One tablet by mouth once daily 6)  Pen Needles 5/16" 31g X 8 Mm Misc (Insulin pen needle) .... Pt test 1 x a day 7)  Humalog Pen 100 Unit/ml Soln (Insulin lispro (human)) .... 7 u subcutaneously three times a day 8)  Celexa 20 Mg Tabs (Citalopram hydrobromide) .... 1/2 by mouth once daily for 1 week then 1 by mouth once daily 9)  Lipitor 20 Mg Tabs (Atorvastatin calcium) .[name].. 1 by mouth at bedtime 10)  Precision Qid Test Strp (Glucose blood) .... Use as directed. 11)  Vitamin E  12)  Stress Tab  13)  Ultram 50 Mg Tabs (Tramadol hcl) .[name].. 1 -2 by mouth every 6 hours as needed 14)  Citalopram Hydrobromide 20 Mg Tabs (Citalopram hydrobromide) .... Take as directed 15)  Diflucan 150 Mg Tabs (Fluconazole) .[name].. 1 by mouth once daily x1, may repeat 3 days as needed 16)  Apidra 100 Unit/ml Soln (Insulin glulisine) .... Take 10 u subcutaneously before each meal 17)  Bd Insulin Syringe 31g X 5/16" 0.3 Ml Misc (Insulin syringe-needle u-100) .[name].. 10 u subcutaneously before each meal 18)  Flexeril 10 Mg Tabs (Cyclobenzaprine hcl) .[name].. 1 by mouth three times a day 19)  Hydrocodone-acetaminophen 5-325 Mg Tabs (Hydrocodone-acetaminophen) .[name].. 1-2 by mouth every 6 hours as needed Prescriptions: HYDROCODONE-ACETAMINOPHEN 5-325 MG TABS (HYDROCODONE-ACETAMINOPHEN) 1-2 by mouth every 6 hours as needed  #60 x 0   Entered and Authorized by:   Loreen Freud DO   Signed by:   Loreen Freud DO on 10/23/2010   Method used:   Print then Give to Patient   RxID:   [id number]

## 2010-11-09 NOTE — Progress Notes (Signed)
Summary: says she has PT appointment set up for this month??  Phone Note Call from Patient Call back at Home Phone [phone]   Caller: Patient Summary of Call: patient saw Dr Laury Axon yesterday and says that she was given an appointment for Physical Therapy but she cant remember the location where she should be going---I didnt see anything for this year as an order in the system (just saw reference about fax for 2012)----is there an appointment set up for her??     Please call her at [phone] Initial call taken by: Jerolyn Shin,  October 24, 2010 12:19 PM  Follow-up for Phone Call        Her appt is 10-25-2010 @ 9am with [facility], they are the ones who scheduled with the patient.  I also s/w confirmed she is aware. Magdalen Spatz [facility]  October 24, 2010 1:46 PM

## 2010-11-09 NOTE — Progress Notes (Signed)
Summary: PHYSICAL THER REFERRAL  Phone Note Other Incoming   Summary of Call: IN REF TO PHYSICAL THERAPY REFERRAL.......Marland Kitchen[facility]ACK[facility], PT CALLED & CXLED HER ABOVE APPT, STATES THAT [facility] [facility] SET HER UP ELSEWHERE.    Initial call taken by:  Magdalen Spatz [facility]  October 25, 2010 2:44 PM  Follow-up for Phone Call        ok Follow-up by: Loreen Freud DO,  October 25, 2010 3:04 PM

## 2010-11-19 ENCOUNTER — Encounter: Payer: Self-pay | Admitting: Family Medicine

## 2010-11-20 ENCOUNTER — Encounter: Payer: Self-pay | Admitting: Family Medicine

## 2010-11-21 ENCOUNTER — Encounter: Payer: Self-pay | Admitting: Family Medicine

## 2010-11-23 NOTE — Miscellaneous (Signed)
Summary: Patient Appointment for PT/[facility]  Patient Appointment for PT/[facility]   Imported By: Maryln Gottron 11/13/2010 14:12:07  _____________________________________________________________________  External Attachment:    Type:   Image     Comment:   External Document

## 2010-12-05 NOTE — Letter (Signed)
Summary: [facility]   Imported By: Maryln Gottron 12/01/2010 13:13:07  _____________________________________________________________________  External Attachment:    Type:   Image     Comment:   External Document

## 2010-12-05 NOTE — Medication Information (Signed)
Summary: Order for Diabetic Testing Supplies  Order for Diabetic Testing Supplies   Imported By: Maryln Gottron 11/28/2010 12:22:02  _____________________________________________________________________  External Attachment:    Type:   Image     Comment:   External Document

## 2010-12-26 NOTE — Letter (Signed)
Summary: [employer] [facility] Orders  [facility] [employer] [place] [facility] Orders   Imported By: Maryln Gottron 12/21/2010 11:26:58  _____________________________________________________________________  External Attachment:    Type:   Image     Comment:   External Document

## 2011-01-02 ENCOUNTER — Emergency Department (HOSPITAL_COMMUNITY)
Admission: EM | Admit: 2011-01-02 | Discharge: 2011-01-03 | Disposition: A | Discharge status: Home / Self Care | Payer: Medicare HMO | Attending: Emergency Medicine | Admitting: Emergency Medicine

## 2011-01-02 DIAGNOSIS — Z794 Long term (current) use of insulin: Secondary | ICD-10-CM | POA: Insufficient documentation

## 2011-01-02 DIAGNOSIS — I10 Essential (primary) hypertension: Secondary | ICD-10-CM | POA: Insufficient documentation

## 2011-01-02 DIAGNOSIS — I2510 Atherosclerotic heart disease of native coronary artery without angina pectoris: Secondary | ICD-10-CM | POA: Insufficient documentation

## 2011-01-02 DIAGNOSIS — M25569 Pain in unspecified knee: Secondary | ICD-10-CM | POA: Insufficient documentation

## 2011-01-02 DIAGNOSIS — E119 Type 2 diabetes mellitus without complications: Secondary | ICD-10-CM | POA: Insufficient documentation

## 2011-01-03 ENCOUNTER — Encounter: Payer: Self-pay | Admitting: Family Medicine

## 2011-01-03 DIAGNOSIS — Z0289 Encounter for other administrative examinations: Secondary | ICD-10-CM

## 2011-01-03 NOTE — Progress Notes (Signed)
This encounter was created in error - please disregard.

## 2011-01-03 NOTE — Progress Notes (Deleted)
  Subjective:    Patient ID: [name], female    DOB: [birth year], 65 y.o.   MRN: [id number]  HPI    Review of Systems     Objective:   Physical Exam  Constitutional: She is oriented to person, place, and time. She appears well-nourished.  HENT:  Nose: Mucosal edema, rhinorrhea and sinus tenderness present. No nasal deformity. Right sinus exhibits maxillary sinus tenderness and frontal sinus tenderness. Left sinus exhibits maxillary sinus tenderness and frontal sinus tenderness.  Mouth/Throat: Oropharynx is clear and moist and mucous membranes are normal. No oropharyngeal exudate.  Neck: Normal range of motion. Neck supple.  Cardiovascular: Normal rate, regular rhythm and normal heart sounds.   No murmur heard. Pulmonary/Chest: Effort normal and breath sounds normal. No respiratory distress. She has no rales.  Lymphadenopathy:    She has no cervical adenopathy.  Neurological: She is alert and oriented to person, place, and time.  Skin: Skin is warm. She is diaphoretic.  Psychiatric: She has a normal mood and affect.          Assessment & Plan:

## 2011-01-05 ENCOUNTER — Encounter: Payer: Self-pay | Admitting: Family Medicine

## 2011-01-05 ENCOUNTER — Ambulatory Visit (INDEPENDENT_AMBULATORY_CARE_PROVIDER_SITE_OTHER): Payer: Self-pay | Admitting: Family Medicine

## 2011-01-05 VITALS — BP 138/76 | HR 64 | Wt 178.6 lb

## 2011-01-05 DIAGNOSIS — Z0000 Encounter for general adult medical examination without abnormal findings: Secondary | ICD-10-CM

## 2011-01-05 DIAGNOSIS — Z01818 Encounter for other preprocedural examination: Secondary | ICD-10-CM

## 2011-01-05 DIAGNOSIS — Z136 Encounter for screening for cardiovascular disorders: Secondary | ICD-10-CM

## 2011-01-05 NOTE — Progress Notes (Signed)
Subjective:    [name] is a 65 y.o. female who presents to the office today for a preoperative consultation at the request of surgeon Dr Shelle Iron, who plans on performing R shoulder arthroscopy on Date TBA . This consultation is requested for the specific conditions prompting preoperative evaluation (i.e. because of potential affect on operative risk): [name] Planned anesthesia: general. The patient has the following known anesthesia issues: none. Patients bleeding risk: no recent abnormal bleeding and no remote history of abnormal bleeding. Patient does not have objections to receiving blood products if needed.  The following portions of the patient's history were reviewed and updated as appropriate: allergies, current medications, past family history, past medical history, past social history, past surgical history and problem list.  Review of Systems Pertinent items are noted in HPI.    Objective:    BP 138/76  Pulse 64  Wt 178 lb 9.6 oz (81.012 kg) General appearance: alert, cooperative, appears stated age, no distress, mildly obese, morbidly obese and pale Head: Normocephalic, without obvious abnormality, atraumatic Eyes: conjunctivae/corneas clear. PERRL, EOM's intact. Fundi benign. Ears: normal TM's and external ear canals both ears Nose: Nares normal. Septum midline. Mucosa normal. No drainage or sinus tenderness. Throat: lips, mucosa, and tongue normal; teeth and gums normal Neck: no adenopathy, no carotid bruit, no JVD, supple, symmetrical, trachea midline and thyroid not enlarged, symmetric, no tenderness/mass/nodules Lungs: clear to auscultation bilaterally Heart: regular rate and rhythm, S1, S2 normal, no murmur, click, rub or gallop Abdomen: soft, non-tender; bowel sounds normal; no masses,  no organomegaly Extremities: extremities normal, atraumatic, no cyanosis or edema Pulses: 2+ and symmetric  Predictors of intubation difficulty:  Morbid obesity? no  Anatomically  abnormal facies? no  Prominent incisors? no  Receding mandible? no  Short, thick neck? no  Neck range of motion: normal     Cardiographics ECG: ;no change since 03/13/2087   Lab Review  No visits with results within 2 Month(s) from this visit. Latest known visit with results is:  Hospital Outpatient Visit on 10/13/2010  Component Date Value  . Glucose-Capillary 10/13/2010 421*  . Color, Urine 10/13/2010 YELLOW   . Appearance 10/13/2010 CLEAR   . Specific Gravity, Urine 10/13/2010 1.038*  . pH 10/13/2010 6.0   . Urine Glucose, Fasting 10/13/2010 >1000*  . Hgb urine dipstick 10/13/2010 NEGATIVE   . Bilirubin Urine 10/13/2010 NEGATIVE   . Ketones 10/13/2010 NEGATIVE   . Protein 10/13/2010 30*  . Urobilinogen, UA 10/13/2010 0.2   . Nitrite 10/13/2010 NEGATIVE   . Leukocytes, UA 10/13/2010 NEGATIVE   . Squamous Epithelial / LPF 10/13/2010 FEW*  . WBC, UA 10/13/2010 0-2   . Bacteria, UA 10/13/2010 FEW*  . Urine-Other 10/13/2010 RARE YEAST   . Neutrophils Relative 10/13/2010 48   . Lymphocytes Relative 10/13/2010 44   . Monocytes Relative 10/13/2010 7   . Eosinophils Relative 10/13/2010 1   . Basophils Relative 10/13/2010 0   . Neutro Abs 10/13/2010 4.3   . Lymphs Abs 10/13/2010 4.0   . Monocytes Absolute 10/13/2010 0.6   . Eosinophils Absolute 10/13/2010 0.1   . Basophils Absolute 10/13/2010 0.0   . WBC Morphology 10/13/2010                     Value:TOXIC GRANULATION                         ATYPICAL LYMPHOCYTES  . Smear Review 10/13/2010 LARGE PLATELETS PRESENT   .  WBC 10/13/2010 9.0   . RBC 10/13/2010 4.74   . Hemoglobin 10/13/2010 14.0   . Hematocrit 10/13/2010 41.1   . MCV 10/13/2010 86.7   . [facility] 10/13/2010 29.5   . MCHC 10/13/2010 34.1   . RDW 10/13/2010 12.6   . Platelets 10/13/2010 152   . Amylase 10/13/2010 35   . Sodium 10/13/2010 136   . Potassium 10/13/2010 4.5   . Chloride 10/13/2010 101   . CO2 10/13/2010 24   . Glucose, Bld 10/13/2010 413*  . BUN  10/13/2010 13   . Creatinine, Ser 10/13/2010 0.79   . Calcium 10/13/2010 10.2   . Total Protein 10/13/2010 7.8   . Albumin 10/13/2010 4.4   . AST 10/13/2010 21   . ALT 10/13/2010 16   . Alkaline Phosphatase 10/13/2010 90   . Total Bilirubin 10/13/2010 0.8   . GFR calc non Af Amer 10/13/2010 >60   . GFR calc Af Amer 10/13/2010                     Value:>60                                The eGFR has been calculated                         using the MDRD equation.                         This calculation has not been                         validated in all clinical                         situations.                         eGFR's persistently                         <60 mL/min signify                         possible Chronic Kidney Disease.  . Lipase 10/13/2010 33   . Glucose-Capillary 10/14/2010 305*  . Comment 1 10/14/2010 Notify RN   . Glucose-Capillary 10/14/2010 253*  . Comment 1 10/14/2010 Notify RN   . Myoglobin, poc 10/14/2010 60.1   . CKMB, poc 10/14/2010 1.4   . Troponin i, poc 10/14/2010 <0.05   . Comment 10/14/2010                     Value:                               TROPONIN VALUES IN THE RANGE                         OF 0.00-0.09 ng/mL SHOW                         NO INDICATION OF  MYOCARDIAL INJURY.                                                        PERSISTENTLY INCREASED TROPONIN                         VALUES IN THE RANGE OF 0.10-0.24                         ng/mL CAN BE SEEN IN:                               -UNSTABLE ANGINA                               -CONGESTIVE HEART FAILURE                               -MYOCARDITIS                               -CHEST TRAUMA                               -ARRYHTHMIAS                               -LATE PRESENTING MI                               -COPD                           CLINICAL FOLLOW-UP RECOMMENDED.                                                        TROPONIN VALUES  >=0.25 ng/mL                         INDICATE POSSIBLE MYOCARDIAL                         ISCHEMIA. SERIAL TESTING                         RECOMMENDED.   hgb a1c  11--- 11/2010 in Dr Willeen Cass office   Assessment:      65 y.o. female with planned surgery as above.   Known risk factors for perioperative complications: Diabetes mellitus   Difficulty with intubation is not anticipated.      Plan:    1. Preoperative workup as follows: Endocrine clearance---last A1c 11. 2. Change in medication regimen before surgery: discontinue ASA 14 days before surgery. 3. Prophylaxis for cardiac events with perioperative beta-blockers: should be considered, specific regimen per  anesthesia. 4. Invasive hemodynamic monitoring perioperatively: at the discretion of anesthesiologist. 5. Deep vein thrombosis prophylaxis postoperatively:regimen to be chosen by surgical team. 6. Surveillance for postoperative MI with ECG immediately postoperatively and on postoperative days 1 and 2 AND troponin levels 24 hours postoperatively and on day 4 or hospital discharge (whichever comes first): at the discretion of anesthesiologist. 7. Other measures: Delay of surgery to improve glycemic control (We will get pt in to see Dr Talmage Nap for clearance as well).

## 2011-01-08 ENCOUNTER — Other Ambulatory Visit: Payer: Self-pay | Admitting: Family Medicine

## 2011-01-11 LAB — CARDIAC PANEL(CRET KIN+CKTOT+MB+TROPI)
Relative Index: 1.2 (ref 0.0–2.5)
Relative Index: 1.4 (ref 0.0–2.5)
Troponin I: 0.02 ng/mL (ref 0.00–0.06)

## 2011-01-11 LAB — D-DIMER, QUANTITATIVE: D-Dimer, Quant: 0.8 ug/mL-FEU — ABNORMAL HIGH (ref 0.00–0.48)

## 2011-01-11 LAB — GLUCOSE, CAPILLARY
Glucose-Capillary: 187 mg/dL — ABNORMAL HIGH (ref 70–99)
Glucose-Capillary: 202 mg/dL — ABNORMAL HIGH (ref 70–99)
Glucose-Capillary: 235 mg/dL — ABNORMAL HIGH (ref 70–99)

## 2011-01-11 LAB — BASIC METABOLIC PANEL
BUN: 18 mg/dL (ref 6–23)
BUN: 16 mg/dL (ref 6–23)
Calcium: 9.4 mg/dL (ref 8.4–10.5)
Chloride: 100 mEq/L (ref 96–112)
Creatinine, Ser: 0.76 mg/dL (ref 0.4–1.2)
GFR calc Af Amer: >60 mL/min (ref >60)
GFR calc non Af Amer: >60 mL/min (ref >60)
GFR calc non Af Amer: >60 mL/min (ref >60)
Glucose, Bld: 224 mg/dL — ABNORMAL HIGH (ref 70–99)
Potassium: 3.8 mEq/L (ref 3.5–5.1)
Sodium: 131 mEq/L — ABNORMAL LOW (ref 135–145)

## 2011-01-11 LAB — LIPID PANEL
Cholesterol: 189 mg/dL (ref 0–200)
LDL Cholesterol: 105 mg/dL — ABNORMAL HIGH (ref 0–99)
VLDL: 51 mg/dL — ABNORMAL HIGH (ref 0–40)

## 2011-01-11 LAB — CBC
HCT: 34 % — ABNORMAL LOW (ref 36.0–46.0)
HCT: 33.5 % — ABNORMAL LOW (ref 36.0–46.0)
Hemoglobin: 11.7 g/dL — ABNORMAL LOW (ref 12.0–15.0)
MCV: 88.8 fL (ref 78.0–100.0)
Platelets: 169 10*3/uL (ref 150–400)
RBC: 3.78 MIL/uL — ABNORMAL LOW (ref 3.87–5.11)
RDW: 13.4 % (ref 11.5–15.5)
WBC: 6.9 10*3/uL (ref 4.0–10.5)

## 2011-01-11 LAB — CK TOTAL AND CKMB (NOT AT ARMC): Relative Index: 1.2 (ref 0.0–2.5)

## 2011-01-11 LAB — IRON AND TIBC
Iron: 50 ug/dL (ref 42–135)
Saturation Ratios: 17 % — ABNORMAL LOW (ref 20–55)
TIBC: 288 ug/dL (ref 250–470)

## 2011-01-11 LAB — DIFFERENTIAL
Eosinophils Absolute: 0.1 10*3/uL (ref 0.0–0.7)
Eosinophils Relative: 1 % (ref 0–5)
Lymphs Abs: 4.9 10*3/uL — ABNORMAL HIGH (ref 0.7–4.0)

## 2011-01-11 LAB — FOLATE: Folate: 12.3 ng/mL

## 2011-01-11 LAB — HEMOGLOBIN A1C: Hgb A1c MFr Bld: 9.5 % — ABNORMAL HIGH (ref 4.6–6.1)

## 2011-01-11 LAB — RETICULOCYTES: Retic Count, Absolute: 36.8 10*3/uL (ref 19.0–186.0)

## 2011-01-11 LAB — TROPONIN I: Troponin I: <0.01 ng/mL (ref 0.00–0.06)

## 2011-01-14 ENCOUNTER — Emergency Department (HOSPITAL_COMMUNITY)
Admission: EM | Admit: 2011-01-14 | Discharge: 2011-01-15 | Disposition: A | Discharge status: Home / Self Care | Payer: Medicare HMO | Attending: Emergency Medicine | Admitting: Emergency Medicine

## 2011-01-14 DIAGNOSIS — I2510 Atherosclerotic heart disease of native coronary artery without angina pectoris: Secondary | ICD-10-CM | POA: Insufficient documentation

## 2011-01-14 DIAGNOSIS — E119 Type 2 diabetes mellitus without complications: Secondary | ICD-10-CM | POA: Insufficient documentation

## 2011-01-14 DIAGNOSIS — Z794 Long term (current) use of insulin: Secondary | ICD-10-CM | POA: Insufficient documentation

## 2011-01-14 DIAGNOSIS — I10 Essential (primary) hypertension: Secondary | ICD-10-CM | POA: Insufficient documentation

## 2011-01-14 LAB — GLUCOSE, CAPILLARY
Glucose-Capillary: 273 mg/dL — ABNORMAL HIGH (ref 70–99)
Glucose-Capillary: 318 mg/dL — ABNORMAL HIGH (ref 70–99)
Glucose-Capillary: 221 mg/dL — ABNORMAL HIGH (ref 70–99)

## 2011-01-15 LAB — VANCOMYCIN, TROUGH: Vancomycin Tr: 14.3 ug/mL (ref 10.0–20.0)

## 2011-01-15 LAB — COMPREHENSIVE METABOLIC PANEL
ALT: 35 U/L (ref 0–35)
AST: 24 U/L (ref 0–37)
Albumin: 2.1 g/dL — ABNORMAL LOW (ref 3.5–5.2)
Alkaline Phosphatase: 117 U/L (ref 39–117)
Alkaline Phosphatase: 129 U/L — ABNORMAL HIGH (ref 39–117)
BUN: 4 mg/dL — ABNORMAL LOW (ref 6–23)
BUN: 9 mg/dL (ref 6–23)
CO2: 27 mEq/L (ref 19–32)
CO2: 27 mEq/L (ref 19–32)
Calcium: 8.3 mg/dL — ABNORMAL LOW (ref 8.4–10.5)
Calcium: 8.6 mg/dL (ref 8.4–10.5)
Calcium: 8.6 mg/dL (ref 8.4–10.5)
Chloride: 102 mEq/L (ref 96–112)
Chloride: 107 mEq/L (ref 96–112)
Creatinine, Ser: 0.61 mg/dL (ref 0.4–1.2)
Creatinine, Ser: 0.66 mg/dL (ref 0.4–1.2)
GFR calc Af Amer: >60 mL/min (ref >60)
GFR calc Af Amer: >60 mL/min (ref >60)
GFR calc non Af Amer: >60 mL/min (ref >60)
GFR calc non Af Amer: >60 mL/min (ref >60)
GFR calc non Af Amer: >60 mL/min (ref >60)
Glucose, Bld: 127 mg/dL — ABNORMAL HIGH (ref 70–99)
Glucose, Bld: 164 mg/dL — ABNORMAL HIGH (ref 70–99)
Glucose, Bld: 272 mg/dL — ABNORMAL HIGH (ref 70–99)
Potassium: 2.7 mEq/L — CL (ref 3.5–5.1)
Potassium: 3.7 mEq/L (ref 3.5–5.1)
Sodium: 138 mEq/L (ref 135–145)
Total Bilirubin: 0.5 mg/dL (ref 0.3–1.2)
Total Bilirubin: 1.3 mg/dL — ABNORMAL HIGH (ref 0.3–1.2)

## 2011-01-15 LAB — TSH: TSH: 1.317 u[IU]/mL (ref 0.350–4.500)

## 2011-01-15 LAB — GLUCOSE, CAPILLARY
Glucose-Capillary: 117 mg/dL — ABNORMAL HIGH (ref 70–99)
Glucose-Capillary: 132 mg/dL — ABNORMAL HIGH (ref 70–99)
Glucose-Capillary: 153 mg/dL — ABNORMAL HIGH (ref 70–99)
Glucose-Capillary: 191 mg/dL — ABNORMAL HIGH (ref 70–99)
Glucose-Capillary: 197 mg/dL — ABNORMAL HIGH (ref 70–99)
Glucose-Capillary: 206 mg/dL — ABNORMAL HIGH (ref 70–99)
Glucose-Capillary: 211 mg/dL — ABNORMAL HIGH (ref 70–99)
Glucose-Capillary: 222 mg/dL — ABNORMAL HIGH (ref 70–99)
Glucose-Capillary: 226 mg/dL — ABNORMAL HIGH (ref 70–99)
Glucose-Capillary: 258 mg/dL — ABNORMAL HIGH (ref 70–99)
Glucose-Capillary: 266 mg/dL — ABNORMAL HIGH (ref 70–99)

## 2011-01-15 LAB — CBC
HCT: 28.3 % — ABNORMAL LOW (ref 36.0–46.0)
HCT: 32 % — ABNORMAL LOW (ref 36.0–46.0)
Hemoglobin: 10.1 g/dL — ABNORMAL LOW (ref 12.0–15.0)
Hemoglobin: 10.6 g/dL — ABNORMAL LOW (ref 12.0–15.0)
Hemoglobin: 9.6 g/dL — ABNORMAL LOW (ref 12.0–15.0)
Hemoglobin: 9.7 g/dL — ABNORMAL LOW (ref 12.0–15.0)
MCHC: 32.8 g/dL (ref 30.0–36.0)
MCHC: 33 g/dL (ref 30.0–36.0)
MCHC: 33.3 g/dL (ref 30.0–36.0)
MCHC: 33.8 g/dL (ref 30.0–36.0)
MCV: 87.8 fL (ref 78.0–100.0)
MCV: 88.2 fL (ref 78.0–100.0)
Platelets: 182 10*3/uL (ref 150–400)
RBC: 3.24 MIL/uL — ABNORMAL LOW (ref 3.87–5.11)
RBC: 3.36 MIL/uL — ABNORMAL LOW (ref 3.87–5.11)
RBC: 3.42 MIL/uL — ABNORMAL LOW (ref 3.87–5.11)
RBC: 3.65 MIL/uL — ABNORMAL LOW (ref 3.87–5.11)
RDW: 13.6 % (ref 11.5–15.5)
WBC: 11.1 10*3/uL — ABNORMAL HIGH (ref 4.0–10.5)
WBC: 12.2 10*3/uL — ABNORMAL HIGH (ref 4.0–10.5)
WBC: 13.3 10*3/uL — ABNORMAL HIGH (ref 4.0–10.5)

## 2011-01-15 LAB — CULTURE, RESPIRATORY W GRAM STAIN
Culture: NORMAL
Gram Stain: NONE SEEN

## 2011-01-15 LAB — MAGNESIUM: Magnesium: 2 mg/dL (ref 1.5–2.5)

## 2011-01-15 LAB — NA AND K (SODIUM & POTASSIUM), RAND UR
Potassium Urine: 19 mEq/L
Sodium, Ur: 137 mEq/L

## 2011-01-15 LAB — BASIC METABOLIC PANEL
CO2: 25 mEq/L (ref 19–32)
Calcium: 8.5 mg/dL (ref 8.4–10.5)
GFR calc Af Amer: >60 mL/min (ref >60)
Sodium: 136 mEq/L (ref 135–145)

## 2011-01-15 LAB — CULTURE, BLOOD (ROUTINE X 2): Culture: NO GROWTH

## 2011-01-15 LAB — POTASSIUM: Potassium: 3 mEq/L — ABNORMAL LOW (ref 3.5–5.1)

## 2011-01-16 LAB — COMPREHENSIVE METABOLIC PANEL
ALT: 10 U/L (ref 0–35)
ALT: 13 U/L (ref 0–35)
ALT: 16 U/L (ref 0–35)
AST: 22 U/L (ref 0–37)
AST: 32 U/L (ref 0–37)
Albumin: 2.2 g/dL — ABNORMAL LOW (ref 3.5–5.2)
Albumin: 4 g/dL (ref 3.5–5.2)
Alkaline Phosphatase: 112 U/L (ref 39–117)
Alkaline Phosphatase: 69 U/L (ref 39–117)
Alkaline Phosphatase: 75 U/L (ref 39–117)
BUN: 19 mg/dL (ref 6–23)
BUN: 25 mg/dL — ABNORMAL HIGH (ref 6–23)
BUN: 27 mg/dL — ABNORMAL HIGH (ref 6–23)
BUN: 27 mg/dL — ABNORMAL HIGH (ref 6–23)
CO2: 25 mEq/L (ref 19–32)
CO2: 27 mEq/L (ref 19–32)
Calcium: 8.6 mg/dL (ref 8.4–10.5)
Calcium: 9.4 mg/dL (ref 8.4–10.5)
Chloride: 107 mEq/L (ref 96–112)
Chloride: 110 mEq/L (ref 96–112)
Chloride: 111 mEq/L (ref 96–112)
Creatinine, Ser: 0.95 mg/dL (ref 0.4–1.2)
Creatinine, Ser: 1.16 mg/dL (ref 0.4–1.2)
GFR calc Af Amer: 57 mL/min — ABNORMAL LOW (ref >60)
GFR calc Af Amer: >60 mL/min (ref >60)
GFR calc non Af Amer: 59 mL/min — ABNORMAL LOW (ref >60)
GFR calc non Af Amer: >60 mL/min (ref >60)
GFR calc non Af Amer: >60 mL/min (ref >60)
Glucose, Bld: 123 mg/dL — ABNORMAL HIGH (ref 70–99)
Glucose, Bld: 143 mg/dL — ABNORMAL HIGH (ref 70–99)
Glucose, Bld: 218 mg/dL — ABNORMAL HIGH (ref 70–99)
Glucose, Bld: 257 mg/dL — ABNORMAL HIGH (ref 70–99)
Potassium: 2.8 mEq/L — ABNORMAL LOW (ref 3.5–5.1)
Potassium: 3.2 mEq/L — ABNORMAL LOW (ref 3.5–5.1)
Potassium: 4 mEq/L (ref 3.5–5.1)
Sodium: 139 mEq/L (ref 135–145)
Sodium: 139 mEq/L (ref 135–145)
Sodium: 141 mEq/L (ref 135–145)
Total Bilirubin: 1.3 mg/dL — ABNORMAL HIGH (ref 0.3–1.2)
Total Bilirubin: 1.3 mg/dL — ABNORMAL HIGH (ref 0.3–1.2)
Total Bilirubin: 1.5 mg/dL — ABNORMAL HIGH (ref 0.3–1.2)
Total Protein: 7.3 g/dL (ref 6.0–8.3)

## 2011-01-16 LAB — GLUCOSE, CAPILLARY
Glucose-Capillary: 167 mg/dL — ABNORMAL HIGH (ref 70–99)
Glucose-Capillary: 214 mg/dL — ABNORMAL HIGH (ref 70–99)
Glucose-Capillary: 102 mg/dL — ABNORMAL HIGH (ref 70–99)
Glucose-Capillary: 126 mg/dL — ABNORMAL HIGH (ref 70–99)
Glucose-Capillary: 137 mg/dL — ABNORMAL HIGH (ref 70–99)
Glucose-Capillary: 147 mg/dL — ABNORMAL HIGH (ref 70–99)
Glucose-Capillary: 148 mg/dL — ABNORMAL HIGH (ref 70–99)
Glucose-Capillary: 179 mg/dL — ABNORMAL HIGH (ref 70–99)
Glucose-Capillary: 188 mg/dL — ABNORMAL HIGH (ref 70–99)
Glucose-Capillary: 188 mg/dL — ABNORMAL HIGH (ref 70–99)
Glucose-Capillary: 194 mg/dL — ABNORMAL HIGH (ref 70–99)
Glucose-Capillary: 195 mg/dL — ABNORMAL HIGH (ref 70–99)
Glucose-Capillary: 240 mg/dL — ABNORMAL HIGH (ref 70–99)
Glucose-Capillary: 285 mg/dL — ABNORMAL HIGH (ref 70–99)

## 2011-01-16 LAB — DIFFERENTIAL
Basophils Absolute: 0 10*3/uL (ref 0.0–0.1)
Eosinophils Relative: 0 % (ref 0–5)
Lymphocytes Relative: 8 % — ABNORMAL LOW (ref 12–46)
Lymphs Abs: 1.5 10*3/uL (ref 0.7–4.0)
Monocytes Absolute: 1.1 10*3/uL — ABNORMAL HIGH (ref 0.1–1.0)
Monocytes Relative: 6 % (ref 3–12)
Neutro Abs: 17.2 10*3/uL — ABNORMAL HIGH (ref 1.7–7.7)

## 2011-01-16 LAB — CBC
HCT: 34.3 % — ABNORMAL LOW (ref 36.0–46.0)
HCT: 41.2 % (ref 36.0–46.0)
HCT: 43.2 % (ref 36.0–46.0)
HCT: 49.5 % — ABNORMAL HIGH (ref 36.0–46.0)
Hemoglobin: 11.7 g/dL — ABNORMAL LOW (ref 12.0–15.0)
Hemoglobin: 13.7 g/dL (ref 12.0–15.0)
Hemoglobin: 14.5 g/dL (ref 12.0–15.0)
Hemoglobin: 9.7 g/dL — ABNORMAL LOW (ref 12.0–15.0)
MCHC: 33.6 g/dL (ref 30.0–36.0)
MCHC: 33.6 g/dL (ref 30.0–36.0)
MCV: 88.3 fL (ref 78.0–100.0)
MCV: 88.6 fL (ref 78.0–100.0)
Platelets: 138 10*3/uL — ABNORMAL LOW (ref 150–400)
Platelets: 205 10*3/uL (ref 150–400)
RBC: 3.24 MIL/uL — ABNORMAL LOW (ref 3.87–5.11)
RBC: 4.89 MIL/uL (ref 3.87–5.11)
RDW: 13.1 % (ref 11.5–15.5)
RDW: 13.4 % (ref 11.5–15.5)
WBC: 12.7 10*3/uL — ABNORMAL HIGH (ref 4.0–10.5)
WBC: 13.9 10*3/uL — ABNORMAL HIGH (ref 4.0–10.5)
WBC: 19.8 10*3/uL — ABNORMAL HIGH (ref 4.0–10.5)

## 2011-01-16 LAB — CALCIUM: Calcium: 8.4 mg/dL (ref 8.4–10.5)

## 2011-01-16 LAB — AMYLASE: Amylase: 383 U/L — ABNORMAL HIGH (ref 27–131)

## 2011-01-16 LAB — URINALYSIS, ROUTINE W REFLEX MICROSCOPIC
Hgb urine dipstick: NEGATIVE
Nitrite: NEGATIVE
Specific Gravity, Urine: 1.025 (ref 1.005–1.030)
Urobilinogen, UA: 0.2 mg/dL (ref 0.0–1.0)
pH: 5.5 (ref 5.0–8.0)

## 2011-01-16 LAB — APTT: aPTT: 27 seconds (ref 24–37)

## 2011-01-16 LAB — CK TOTAL AND CKMB (NOT AT ARMC)
Relative Index: INVALID (ref 0.0–2.5)
Total CK: 93 U/L (ref 7–177)

## 2011-01-16 LAB — URINE CULTURE: Culture: NO GROWTH

## 2011-01-16 LAB — LIPID PANEL
HDL: 33 mg/dL — ABNORMAL LOW (ref >39)
Total CHOL/HDL Ratio: 5.3 RATIO
Triglycerides: 172 mg/dL — ABNORMAL HIGH (ref <150)
VLDL: 34 mg/dL (ref 0–40)

## 2011-01-16 LAB — URINE MICROSCOPIC-ADD ON

## 2011-01-16 LAB — HEMOGLOBIN A1C: Hgb A1c MFr Bld: 12.2 % — ABNORMAL HIGH (ref 4.6–6.1)

## 2011-01-16 LAB — TROPONIN I: Troponin I: 0.02 ng/mL (ref 0.00–0.06)

## 2011-02-13 ENCOUNTER — Other Ambulatory Visit: Payer: Self-pay | Admitting: Family Medicine

## 2011-02-20 NOTE — Group Therapy Note (Signed)
[name], [name]             ACCOUNT NO.:  [id number]   MEDICAL RECORD NO.:  [id number]          PATIENT TYPE:  INP   LOCATION:  1309                         FACILITY:  [facility]   PHYSICIAN:  Theodosia Paling, MD    DATE OF BIRTH:  [birth year]                                 PROGRESS NOTE   DATE OF DISCHARGE:  To be determined by dictating discharge physician.   PRIMARY CARE PHYSICIAN:  Dr. Janetta Hora. Hulan Saas, M.D.   ADMITTING HISTORY:  Please refer to my admission note for admitting history and a history of  present illness.   DISCHARGE DIAGNOSES:  1. Acute pancreatitis.  2. Hypokalemia.  3. Shortness of breath.   SECONDARY DIAGNOSES:  1. History of hypertension.  2. History of diabetes.   DISCHARGE MEDICATION:  To be determined by discharging physician.   CONSULTATION PERFORMED:  None.   PROCEDURE PERFORMED:  None.   IMAGING STUDIES:  1. Ultrasound of the abdomen on Mar 04, 2009 showing stable      gallbladder polyps with no gallstones.  Common bile duct diameter      of 7 mm, upper limit of normal.  2. Chest x-ray showing worsening atelectasis in right lung.  3. CT scan abdomen, pelvis with contrast performed on Mar 03, 2009      showing acute pancreatitis without focal pseudocyst or abscess.   HOSPITAL COURSE:  Following issues were addressed during the hospitalization.  1. Acute pancreatitis.  The patient received medical management.      Enzymes got back to normal.  She received IV pain medications,      fluids and antiemetics.  Currently she is able to keep the foot      down, however, she is complaining of some abdominal distention.      Given her hypokalemia and narcotic use I am ordering x-ray of the      abdomen to rule out ileus.  2. Hyperkalemia.  The patient has developed hyperkalemia.  Apparently      when she came that time she also had hyperkalemia.  There is no      evidence of diarrhea and at this time there is no evidence of GI      loss.  We  will repeat the potassium as hyperkalemia could be      contributing to her lethargy.  3. Lethargy.  The patient feels extremely tired.  Could be a      contribution of narcotics or hyperkalemia.  The PT consult was      obtained which recommended home PT, OT.  4. Shortness of breath.  Today the patient is complaining of some      shortness of breath along with dry cough.  She did have some      atelectasis at the time of admission.  We will start the patient on      incentive spirometry and at same time we will also perform CT to      rule out acute pulmonary embolism.   DISPOSITION:  To be determined by discharging physician.  Total time spent for this  interim discharge  summary was 20 minutes.      Theodosia Paling, MD  Electronically Signed     NP/MEDQ  D:  03/08/2009  T:  03/08/2009  Job:  [id number]

## 2011-02-20 NOTE — Discharge Summary (Signed)
[name], [name]             ACCOUNT NO.:  [id number]   MEDICAL RECORD NO.:  [id number]          PATIENT TYPE:  INP   LOCATION:  1309                         [facility]ACILITY:  [facility]   PHYSICIAN:  Lonia Blood, M.D.DATE OF BIRTH:  27-Apr-1946   DATE OF ADMISSION:  03/03/2009  DATE OF DISCHARGE:  03/14/2009                               DISCHARGE SUMMARY   PRIMARY CARE PHYSICIAN:  Charles E. Hulan Saas, M.D.   DISCHARGE DIAGNOSES:  1. Acute idiopathic pancreatitis.      a.     Second episode since November of 2009.      b.     Spontaneously improved during hospital stay.      c.     Previously attributed to hypertriglyceridemia -       triglycerides are acceptable this admission.      d.     Consider outpatient ERCP when symptoms are fully resolved.  2. Bilateral thyroid nodules.      a.     Normal thyroid function biochemically.      b.     Status post fine needle aspiration on March 14, 2009 - path       results pending.  3. Bilateral lower lobe nosocomial pneumonia - clinically resolved.  4. Hypertension.  5. Diabetes mellitus - labile control.  6. Coronary artery disease status post percutaneous transluminal      coronary angioplasty - asymptomatic.   DISCHARGE MEDICATIONS:  1. Toprol XL 50 mg p.o. daily.  2. Zestril 10 mg p.o. daily.  3. Aspirin 81 mg daily.  4. Lantus insulin 20 units subcutaneous q.h.s.  5. Amaryl 8 mg p.o. daily.  6. Precose 5 mg p.o. daily.  7. Calcium plus D p.o. b.i.d.  8. Lovaza one tablet p.o. daily.  9. Percocet 5/325 one p.o. q.6h. p.r.n. severe pain.   FOLLOWUP:  The patient is to follow up with her established primary care  physician, Dr. Hulan Saas, on the Friday following her discharge (which  occurred on a Monday).  At that time, the patient's fine-needle  aspiration results should be available in the hospital computer system.  These should be evaluated and discussed with the patient and plans made  to address any positive results.   Additionally, the patient should be  evaluated for abdominal pain or pancreatitis symptoms.  As discussed  above, the possibility of outpatient GI referral for ERCP could be  considered in this patient with idiopathic pancreatitis.   CONSULTATIONS:  None.   PROCEDURES:  1. CT scan of abdomen and pelvis on Mar 03, 2009 - findings consistent      with acute pancreatitis without focal pseudocyst or abscess.  No      other acute findings otherwise.  2. Ultrasound of the abdomen on Mar 04, 2009 - stable gallbladder      polyps with no gallstones identified.  Common bile duct diameter is      7 mm which is the upper limits of normal.  Edematous pancreas      consistent with pancreatitis.  3. CT angio of the chest on March 08, 2009 - negative  for acute PE or      thoracic aortic dissection.  Carotid and aortic arch      calcifications.  New bilateral pleural effusions and small      pericardial effusion.  Extensive consolidation basilar segments of      bilateral lower lobes.  Asymmetric thyroid enlargement incompletely      characterized with recommendation for ultrasound.  4. MRCP on March 08, 2009 - moderate pancreatitis without common duct      stone or biliary ductal dilatation.  5. Ultrasound of the soft tissue of the neck on March 11, 2009 - marked      thyroid enlargement although overall thyroid size is stable.      Numerous large nodules bilaterally.  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  A dominant nodule in the lower pole of      the right thyroid lobe contains microcalcification.  Given the size      and calcification tissue, sampling is recommended.  The dominant      nodule in the lower pole of the left thyroid gland is not as      clearly demonstrated on today's study.  Tissue sampling of this      nodule is recommended to exclude neoplasm.  6. Interventional radiology directed fine-needle ultrasound biopsy of      thyroid nodules - pending - on March 14, 2009.   HOSPITAL COURSE:  [name] is a very pleasant 65 year old  female who was admitted to the hospital in November of 2009 with  abdominal pain.  At that time, she proved to have acute pancreatitis as  evidenced by elevated lipase.  She was treated accordingly and  ultimately was found to have hypertriglyceridemia.  This was felt to be  the etiology of her pancreatitis.  She returned to the hospital on Mar 03, 2009 with complaints of severe abdominal pain.  She was admitted to  the acute unit after it was discovered that once again she was suffering  with acute pancreatitis.  CT scan of the abdomen and pelvis failed to  reveal any evidence of gallstones or gallbladder disease.  The patient  denies any use of alcohol and does not display any clinical signs or  symptoms of alcohol abuse.  The patient was admitted to the acute units  and treated according to the typical protocol for acute pancreatitis.  Her symptoms did improve rapidly.  Fasting lipid panel was obtained and  did reveal a triglyceride level of approximately 150.  This is certainly  not elevated in the range that one would expect to cause acute  pancreatitis.  MRCP was accomplished.  There was no evidence of ductal  dilatation, mass or stone.  At this time, the patient's acute  pancreatitis remains idiopathic.  She is to continue on all of her  current medications, as none of these are typically implicated in severe  acute pancreatitis.  She is to follow up with her primary care  physician.  Should she develop further symptoms, and even if not, the  possibility of outpatient ERCP should be considered for definitive  evaluation of the patient's biliary tree.  She will continue her Lovaza  as previously prescribed, as it does appear to be having a significant  positive impact on her triglyceride levels.   During the patient's hospital stay, she developed significant shortness  of breath.  CT scan of the chest  was obtained to  rule out the  possibility of acute pulmonary emboli.  There was no evidence of  pulmonary emboli, but there were findings consistent with bilateral  lower lobe infiltrates.  The patient was treated empirically for  nosocomial pneumonia with Zosyn and vancomycin.  Sputum was sent for  culture.  Gram stain revealed a scant number of gram-negative bacteria.  Culture failed to reveal any offending pathogen.  The patient was  titrated to Avelox and completed a full 7-day course of therapy prior to  her discharge from the hospital.  At the present time, she does not  display pulmonary symptoms to suggest recurrent or recalcitrant  pneumonia.   As noted above, when the patient developed shortness of breath, a CT  scan of the chest was carried out.  This in fact revealed limited images  of the thyroid, as well.  These images raised a question of possible  nodularity within the thyroid.  A thyroid ultrasound was therefore  ordered to further evaluate.  The thyroid ultrasound revealed a  multinodular goiter.  More specifically, a significantly enlarged nodule  was identified in each of the thyroid's two lobes that was felt to be  suspicious in radiographic appearance.  There was recommendation for  finding of aspiration of these lesion.  Free T4 and TSH were assessed  and were found to be perfectly within normal limits.  The patient is  without symptoms of hyper or hypothyroidism.  FNA was requested by  interventional radiology.  Unfortunately, given the fact that it is  presently the weekend, this procedure is not available.  At the present  time, the patient is awaiting performance of this procedure.  The  patient is scheduled to have her procedure on March 14, 2009.  After the  procedure, it is anticipated that she will be cleared for discharge  home, barring the appearance of new clinical features.  The patient is  instructed to follow up with her primary care physician at the  end of  the week, as it is anticipated that the results of the fine needle  aspiration will be available at that time.      Lonia Blood, M.D.  Electronically Signed     JTM/MEDQ  D:  03/13/2009  T:  03/13/2009  Job:  [id number]   cc:   Janetta Hora. Hulan Saas, M.D.  Fax: [phone]

## 2011-02-20 NOTE — Discharge Summary (Signed)
[name], [name]             ACCOUNT NO.:  [id number]   MEDICAL RECORD NO.:  [id number]          PATIENT TYPE:  OBV   LOCATION:  1334                         FACILITY:  [facility]   PHYSICIAN:  [name], MDDATE OF BIRTH:  18-Jan-1946   DATE OF ADMISSION:  08/30/2008  DATE OF DISCHARGE:  08/31/2008                               DISCHARGE SUMMARY   DISCHARGE DIAGNOSES:  1. Mild acute pancreatitis, symptoms resolved.  Suspect medication      related, see details below.  2. Type 2 diabetes uncontrolled, appears resistant.  Question      appropriate for insulin therapy, deferred outpatient follow-up with      primary care.  See details below.  3. Hypertension.  4. Dyslipidemia.  5. History of coronary disease status post history of PCI.   DISCHARGE MEDICATIONS:  Discontinuation, at least for temporary period  of time, of the following medications:  1. Crestor 20 mg daily.  2. Prandin 4 mg b.i.d.  3. Chlorthalidone 25 mg daily.   Other medications as prior to admission:  1. Aspirin 81 mg daily.  2. Lotrel 10/20 p.o. daily (noting includes ACE inhibitor which may      cause pancreatitis, but will plan to continue without change at      this time).  3. Metformin 1 gram b.i.d.  4. Calcium 1500 mg b.i.d.  5. Amaryl 4 mg tablets 2 tablets p.o. daily.  6. Precose 25 mg daily.  7. Actos 15 mg daily.  8. Toprol XL 50 mg daily.  9. Arthrotec one tablet b.i.d. p.r.n. arthritis pain.  10.The patient is also given new prescriptions for Vicodin 1-2 tablets      q. 4-6 hours p.r.n. pain dispensed 20.  11.Phenergan 12 mg tablets one p.o. q.4-6 h p.r.n. nausea, dispensed      20 tablets.   DISPOSITION:  The patient is discharged home in medically good and  stable condition.  She is having no abdominal pain, tolerating a solid  low-fat diet without pain and no recurrent nausea or vomiting.  Hospital  follow-up is primary care physician Dr. Loreen Nicholson for one week,  Tuesday  December 1 at 1:45 p.m.  The patient is instructed that if she  has fever or increasing abdominal pain, she is to call MD or return to  the emergency room for further evaluation and treatment.   HOSPITAL COURSE:  1. Mild acute pancreatitis.  The patient is a 65 year old woman with      multiple medical issues as listed above who presented to the      emergency room at [facility] due to complaints of abdominal pain.      She was found to have mildly elevated lipase consistent with acute      pancreatitis and she was admitted for further evaluation and      treatment.  She had minimal abdominal pain symptoms after being      given narcotics and was able to tolerate clear liquids with      relative ease.  She underwent abdominal ultrasound to rule out      gallstones  which was felt to be etiology, but this showed no      evidence of stones, bile duct dilation or acute cholecystitis      changes.  Fasting lipid profile did show her triglycerides to be      elevated in the 500's.  The question if this is reactive versus      causation of pancreatitis to this end.  It appears medications may      have been the trigger for her pancreatitis as there is no other      history of trauma or appreciable mass on ultrasound.  Thereby, at      this time, until further evaluation by her primary care physician,      we have placed the following potential suspect medications on hold      including Crestor, Prandin and chlorthalidone.  We have also noted      ACE inhibitor can be associated with pancreatitis and is part of      her Lotrel combination.  It is not clear on the patient's home      medication record that she is taking isolated Zestril as indicated      in H&P.  The patient has been instructed that if this is home      medication, she should hold this also at this time.  Further follow-      up with primary care physician is arranged for next week, and the      patient is felt stable for  discharge home at this time.  2. Type 2 diabetes.  Appears to be uncontrolled and A1C was drawn this      hospitalization. This is still pending at time of dictation.      Several diabetic medications have been placed on hold due to the      acute pancreatitis as noted above.  Question the patient may      benefit from insulin therapy for management.  Will defer this to      her primary care physician on follow-up.  3. Dyslipidemia.  Again as noted above.  Patient with      hypertriglyceridemia notable a fasting lipid profile, and we are      holding Crestor due to possible induction of pancreatitis as given      her current symptoms.  Question need for isolated niacin therapy or      fish oil, but defer to primary care physician to recheck fasting      lipid after acute issue of pancreatitis has resolved with treatment      to be managed as needed.  4. Abnormal abdominal ultrasound.  The patient's ultrasound of the      abdomen did also report need for a pelvic ultrasound to be arranged      in follow-up as an outpatient given a history of a complex pelvic      cyst appreciated on MRI, but not well appreciated on the abdominal      ultrasound performed this hospitalization.  Will make note to      primary care physician as well as to patient and ask primary care      physician to arrange follow-up pelvic ultrasound for further      evaluation of this as needed once acute issues here have resolved.  5. Other medical issues.  The patient's other chronic medical issues      are as listed here in an H&P without change.  The  patient is felt      stable for discharge home at this time.      Emily A. Felicity Coyer, MD  Electronically Signed     VAL/MEDQ  D:  08/31/2008  T:  08/31/2008  Job:  [id number]

## 2011-02-20 NOTE — H&P (Signed)
NAMEAVAREY, YAEGER             ACCOUNT NO.:  [id number]   MEDICAL RECORD NO.:  [id number]          PATIENT TYPE:  EMS   LOCATION:  ED                           FACILITY[facility]  [facility]   PHYSICIAN:  Theodosia Paling, MD    DATE OF BIRTH:  07-Mar-1946   DATE OF ADMISSION:  03/03/2009  DATE OF DISCHARGE:                              HISTORY & PHYSICAL   PRIMARY CARE PHYSICIAN:  Dr. Hulan Saas.   CHIEF COMPLAINT:  She is complaining of abdominal pain for the past 1  day, with episodes of vomiting.   HISTORY OF PRESENT ILLNESS:  The patient was on her usual state of  health until she developed abdominal pain 10/10, diffuse,  radiating  into back at rest, not related to food in any way.  Aggravated by  walking and relieved by lying on side.  Similar episode she had in  January 2010 for which she was admitted to [facility] and discharged  after 1 day.   PAST MEDICAL HISTORY:  Positive for hypertension for the past 10 years.  Diabetes for the past 10 years.  Acute pancreatitis in January 2010 and  coronary artery disease.   PAST SURGICAL HISTORY:  PTCA in 2010.  Tubal ligation.  Postmenopausal.   FAMILY HISTORY:  Father had lung cancer, history of hypertension.  Mother had ovarian carcinoma, hypertension. Sister had breast, throat,  and uterine cancer.   SOCIAL HISTORY:  The patient lives with her husband, smoking 1 pack per  day for the past 20 years with alcohol 30 years back.  No history of IV  drug use.   DRUG ALLERGIES:  No known drug allergies.   MEDICATIONS:  The patient's home medications are Toprol-XL 50 mg per  oral 1 tablet a day, Zestril 10 mg oral once a day, aspirin 81 mg  oralonce a day, insulin glargine (Lantus) 100 units/mL 1 at night  subcutaneously.  Amaryl 4 mg oralBD, Precose 50 mg oral BD, calcium 600  mg with vitamin D oral BD, Lovaza per oral BD, and [employer]  Stresstabs per oral daily.   REVIEW OF SYSTEMS:  Negative except for history of loss of 20 pounds  over the last 2 months. Otherwise negative.   PHYSICAL EXAMINATION:  VITAL SIGNS:  Temperature 97, pulse rate 71 per  minute, respirations 18, blood pressure 162/69.  Oxygen saturation 100%  on room air.  GENERAL:  There is no pallor or icterus.  Hydration is poor.  HEENT:  Atraumatic, normocephalic.  NECK:  No jugular venous distention. Mild goiter.  CHEST: Bilateral lungs clear. No crepitations.  No wheezing.  BREASTS: Symmetrical.  No mass palpable.  She is following with her  doctor because of a history of breast cancer in her sister.  HEART: S1 and S2 normal. No murmurs, rubs, or gallops.  ABDOMEN: Distended. Tender over epigastric area.  No organomegaly.  Bowel sounds are normal. Bowel sounds are present.  No free fluid.  PELVIC: Bilateral lower abdomen nontender, no mass palpable.  EXTREMITIES:  No pitting edema.  SKIN: No abnormality detected.  NEUROLOGICAL: Oriented x 3 following commands. Speech intact.  LABS:  Initial labs positive for leukocytosis, WBCs 19.8, hemoglobin  16.6, hematocrit 49.5.  Platelets 205.  Sodium 137, potassium 2.8,  chloride 100, bicarbonate 22, blood urea nitrogen 25, creatinine 1.16.  Glucose when the patient came in was 512. It is now 335.  Neutrophils 87  which is increased.  Lipase is 153 which is high.   Initial CT scan shows acute pancreatitis without focal pseudocyst or any  abdominal masses at this time. No other acute findings.  CT pelvis:  No  acute or significant findings in the pelvis.   IMPRESSION AND PLAN:  1. Acute pancreatitis.  Will keep the patient NPO and maintain IV      hydration with normal saline.  We will continue supportive care      with pain meds and antiemetics.  2.Diabetes.  Will continue sliding scale.  Hold oral hypoglycemic and  Lantus.  1. Hypertension. Continue home medications for hypertension.  Prophylaxis.  For deep vein thrombosis, we will give the patient SCDs.  For GI prophylaxis, we will give PPI.    Total time spent on this patient was 1 hour.  Total time spent 45 mts,  please cc above admitt note to Dr Hulan Saas.      Theodosia Paling, MD  Electronically Signed     NP/MEDQ  D:  03/03/2009  T:  03/03/2009  Job:  [id number]   cc:   Janetta Hora. Hulan Saas, M.D.  Fax: [phone]

## 2011-02-20 NOTE — H&P (Signed)
NAMECOLLEN, HOSTLER             ACCOUNT NO.:  [id number]   MEDICAL RECORD NO.:  [id number]          PATIENT TYPE:  INP   LOCATION:  0102                         FACILITY:  [facility]   PHYSICIAN:  Therisa Doyne, MD    DATE OF BIRTH:  01/13/1946   DATE OF ADMISSION:  08/30/2008  DATE OF DISCHARGE:                              HISTORY & PHYSICAL   PRIMARY CARE Dorota Heinrichs:  Dr. Hulan Saas.   CHIEF COMPLAINT:  Abdominal pain.   PRESENT ILLNESS:  A 65 year old [place] American female with past  medical history significant hypertension, diabetes mellitus and coronary  artery disease presents to the emergency department for evaluation of  abdominal pain.  She reports a 3-day history of epigastric abdominal  pain with associated indigestion.  This pain has been constant in  nature.  It has been associated with belching.  Earlier today the pain  radiated to her back.  She tried Pepcid [facility] which did help the pain  somewhat.  However, she has had persistent nausea and pain, so she came  to the emergency department room for evaluation.  She denies any emesis.  She currently rates her pain is a 0 out of 10.   REVIEW OF SYSTEMS:  All systems were reviewed and are negative except as  mentioned above in his present illness.   PAST MEDICAL HISTORY:  1. Hypertension.  2. Diabetes.  3. Coronary artery disease status post PCI   SOCIAL HISTORY:  The patient lives in [place].  She denies any  alcohol use.  She does continue to smoke.   FAMILY HISTORY:  There is no history of pancreas disorders.   ALLERGIES:  No known drug allergies.   MEDICATIONS:  1. Metformin.  2. Lotrel 05/10 mg daily.  3. Zestril 20 mg daily.  4. Precose 50 mg daily.  5. Vitamin E.  6. Prandin 2 mg daily.  7. Aspirin 81 mg daily.  8. Amaryl 4 mg daily.  9. Toprol XL 50 mg daily.  10.Chlorthalidone 25 mg daily.  11.Darvocet p.r.n.   PHYSICAL EXAMINATION:  VITAL SIGNS:  Temperature 98.  Blood pressure  192/97.  Pulse of  89.  Respirations 18.  Oxygen saturation 100% on room  air.  GENERAL:  No acute distress.  HEENT:  Normocephalic, atraumatic.  Pupils equally round, reactive to  light and accommodation.  Extraocular muscles are intact.  Oropharynx  reveals dry mucous membranes.  NECK:  Supple.  No lymphadenopathy, no jugular venous distention.  No  masses.  CARDIOVASCULAR:  Regular rate and rhythm.  No murmurs, rubs or gallops.  CHEST:  Clear to auscultation bilaterally.  ABDOMEN:  Positive bowel sounds.  There is mild tenderness to palpation  in the epigastric region.  No rebound or guarding.  EXTREMITIES:  No clubbing, cyanosis or edema.  Dorsalis pedis pulses are  2+ bilaterally.  SKIN:  No rashes.  BACK:  No CVA tenderness.   LABORATORY STUDIES:  Normal CBC.  CMP is notable for a glucose of 325,  calcium of 10.  Liver function studies are within normal limits.  Lipase  elevated at 165.  Urinalysis  unremarkable.   EKG shows normal sinus rhythm with right bundle branch block as well as  inferior T-waves.   ASSESSMENT/PLAN:  A 65 year old African American female with past  medical history significant for diabetes, hypertension and coronary  artery disease who presents to the emergency department with acute  abdominal pain and was found to have pancreatitis.   1. We will admit the patient [employer].   1. 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  She denies any history of alcohol use.  I      do recommend checking a right upper quadrant ultrasound and this      has been ordered.  Additionally, check daily lipase levels as well      as a fasting lipid profile to ensure that she does not of      hypertriglyceridemia as a cause for her pancreatitis.  We will      symptomatically treat the patient normal saline at 150 mL per hour.      We will also give her morphine and Zofran for pain and nausea.      Right now her pain  appears to be under fairly good control.  Should      she continue to improve then there may be reasonable to discharge      the patient later this afternoon or tomorrow.   1. Diabetes mellitus currently with hyperglycemia.  There is no      evidence of HHNS or acute acidosis.  We will check her  hemoglobin      A1c and place the patient on sliding scale insulin protocol.   1. Hypertension.  Currently her blood pressure elevated likely because      of pain.  We will start her on her home medications of Lotrel,      Zestril, Toprol and chlorthalidone.   1. Coronary artery disease status post PCI appears to be stable at      this time.   1. Fluids, electrolytes and nutrition;  Normal saline at 100 mL/hour,      electrolytes are stable.  Full liquid diet.   1. Deep vein thrombosis prophylaxis with Lovenox.     Therisa Doyne, MD  Electronically Signed    SJT/MEDQ  D:  08/30/2008  T:  08/30/2008  Job:  [id number]

## 2011-02-22 ENCOUNTER — Telehealth: Payer: Self-pay | Admitting: Family Medicine

## 2011-02-22 ENCOUNTER — Other Ambulatory Visit: Payer: Self-pay | Admitting: Family Medicine

## 2011-02-22 NOTE — Telephone Encounter (Signed)
Patient called to see if Dr Veronda Prude office (surgeon at [facility]) had sent in paperwork for approval for shoulder surgery--please call her to let her know

## 2011-02-22 NOTE — Telephone Encounter (Signed)
error 

## 2011-02-23 ENCOUNTER — Telehealth: Payer: Self-pay | Admitting: Family Medicine

## 2011-02-23 NOTE — Telephone Encounter (Signed)
Pt notified that the office does have some Humalog. She is aware to go ahead and come because the office closes at 5 or the weekend.

## 2011-02-23 NOTE — Telephone Encounter (Signed)
Patient wants sample of humalog

## 2011-02-23 NOTE — Telephone Encounter (Signed)
Pt already spoke with Selena Batten about issue and was advise that she needed to contact dr balan office to obtain clearance before Dt lowne will clear Pt for surgery.

## 2011-02-24 ENCOUNTER — Other Ambulatory Visit: Payer: Self-pay | Admitting: Family Medicine

## 2011-02-26 NOTE — Telephone Encounter (Signed)
Spoke with pharmacy Rx not received on 02-13-11 Rx verbally given over phone

## 2011-03-12 ENCOUNTER — Ambulatory Visit (HOSPITAL_BASED_OUTPATIENT_CLINIC_OR_DEPARTMENT_OTHER)
Admission: RE | Admit: 2011-03-12 | Discharge: 2011-03-12 | Disposition: A | Discharge status: Home / Self Care | Payer: Worker's Compensation | Source: Ambulatory Visit | Attending: Specialist | Admitting: Specialist

## 2011-03-12 DIAGNOSIS — I2510 Atherosclerotic heart disease of native coronary artery without angina pectoris: Secondary | ICD-10-CM | POA: Insufficient documentation

## 2011-03-12 DIAGNOSIS — M25819 Other specified joint disorders, unspecified shoulder: Secondary | ICD-10-CM | POA: Insufficient documentation

## 2011-03-12 DIAGNOSIS — Z7982 Long term (current) use of aspirin: Secondary | ICD-10-CM | POA: Insufficient documentation

## 2011-03-12 DIAGNOSIS — E119 Type 2 diabetes mellitus without complications: Secondary | ICD-10-CM | POA: Insufficient documentation

## 2011-03-12 DIAGNOSIS — M24119 Other articular cartilage disorders, unspecified shoulder: Secondary | ICD-10-CM | POA: Insufficient documentation

## 2011-03-12 DIAGNOSIS — Z79899 Other long term (current) drug therapy: Secondary | ICD-10-CM | POA: Insufficient documentation

## 2011-03-12 DIAGNOSIS — Z794 Long term (current) use of insulin: Secondary | ICD-10-CM | POA: Insufficient documentation

## 2011-03-12 DIAGNOSIS — X58XXXA Exposure to other specified factors, initial encounter: Secondary | ICD-10-CM | POA: Insufficient documentation

## 2011-03-12 DIAGNOSIS — M7500 Adhesive capsulitis of unspecified shoulder: Secondary | ICD-10-CM | POA: Insufficient documentation

## 2011-03-12 DIAGNOSIS — S43429A Sprain of unspecified rotator cuff capsule, initial encounter: Secondary | ICD-10-CM | POA: Insufficient documentation

## 2011-03-12 DIAGNOSIS — E785 Hyperlipidemia, unspecified: Secondary | ICD-10-CM | POA: Insufficient documentation

## 2011-03-12 DIAGNOSIS — I10 Essential (primary) hypertension: Secondary | ICD-10-CM | POA: Insufficient documentation

## 2011-03-12 LAB — COMPREHENSIVE METABOLIC PANEL
ALT: 23 U/L (ref 0–35)
AST: 19 U/L (ref 0–37)
Albumin: 4.1 g/dL (ref 3.5–5.2)
CO2: 26 mEq/L (ref 19–32)
Calcium: 10.4 mg/dL (ref 8.4–10.5)
Chloride: 100 mEq/L (ref 96–112)
Creatinine, Ser: 0.71 mg/dL (ref 0.4–1.2)
GFR calc Af Amer: >60 mL/min (ref >60)
GFR calc non Af Amer: >60 mL/min (ref >60)
Sodium: 137 mEq/L (ref 135–145)

## 2011-03-12 LAB — CBC
Hemoglobin: 12.5 g/dL (ref 12.0–15.0)
MCH: 29.2 pg (ref 26.0–34.0)
MCHC: 33.7 g/dL (ref 30.0–36.0)
Platelets: 209 10*3/uL (ref 150–400)
RBC: 4.28 MIL/uL (ref 3.87–5.11)

## 2011-03-12 LAB — GLUCOSE, CAPILLARY

## 2011-03-12 NOTE — Telephone Encounter (Signed)
error 

## 2011-03-14 ENCOUNTER — Telehealth: Payer: Self-pay | Admitting: Family Medicine

## 2011-03-14 NOTE — Telephone Encounter (Signed)
Pt aware samples ready for pick up 

## 2011-03-22 ENCOUNTER — Other Ambulatory Visit: Payer: Self-pay | Admitting: Family Medicine

## 2011-03-26 NOTE — Op Note (Signed)
[name], [name]             ACCOUNT NO.:  [id number]  MEDICAL RECORD NO.:  [id number]  LOCATION:                                 FACILITY:  PHYSICIAN:  Jene Every, M.D.    DATE OF BIRTH:  10/03/1946  DATE OF PROCEDURE:  03/12/2011 DATE OF DISCHARGE:                              OPERATIVE REPORT   PREOPERATIVE DIAGNOSES:  Impingement syndrome, rotator cuff tear, right shoulder.  POSTOPERATIVE DIAGNOSES: 1. Anterior labral tear. 2. Impingement syndrome. 3. Partial tear of the rotator cuff. 4. Adhesive capsulitis.  PROCEDURE PERFORMED: 1. Exam and manipulation under anesthesia. 2. Right shoulder arthroscopy with debridement of anterior-superior     labrum 3. Debridement of articular side rotator cuff tear. 4. Subacromial decompression, bursectomy, acromioplasty.  BRIEF HISTORY:  A 65 year old, impingement pain, history of distal clavicle resection and biceps tenodesis, had a work-related injury, persistent pain in the shoulder, initially neck then gravitated into the shoulder, had impingement pain.  MRI indicating partial tear of the rotator cuff refractory to conservative treatments, was indicated for exam under anesthesia, rotator cuff repair if necessary, arthroscopy, and decompression.  Risk and benefits discussed including bleeding, infection, no change in symptoms, worsening symptoms, need for repeat debridement, DVT, PE, anesthetic complications, etc.  TECHNIQUE:  The patient in supine beach-chair position.  After induction of adequate anesthesia, 1 g of Kefzol and 600 clindamycin due to her history of insulin-dependent diabetes.  The right shoulder was examined under anesthesia.  She had full range of motion, perhaps lacked slight forward flexion, which is improved with gentle manipulation.  We then used a surgical marker utilized to delineate the acromion, coracoid and the [name].  Standard posterolateral and anterolateral portals were utilized with  incision through the skin only.  A 0.25% Marcaine with epinephrine was infiltrated in the joint.  With the arm in 70/30 position with gentle traction, we advanced arthroscopic camera into the glenohumeral joint penetrating the capsule atraumatically.  This was in line with the coracoid.  Next, to localize the anterior portal, we used an 18 gauge needle and advanced it through the capsule and what would have been just inferior to the biceps tendon.  She had hypertrophic capsule anteriorly and scar tissue from previous surgery.  We then advanced the arthroscopic cannula in line with the 18 gauge needle penetrating the capsule atraumatically.  Examination of the joint then revealed anterior and superior labral tearing.  Some grade 3 changes of the midportion of the glenoid.  There was some fraying of the rotator cuff, the supraspinatus lateral to the bicipital groove.  Biceps was not identified consistent with previous history of tenotomy.  Subscap was unremarkable as well.  I introduced a shaver and debrided the labrum to a stable base.  We also debrided the rotator cuff.  There appeared to be a minor tearing, not a full-thickness tearing perhaps consistent with that seen on the MRI, 10% to 15%.  The glenoid was unremarkable.  The humeral head was unremarkable.  We redirected the camera into the subacromial space and transferred the anterior portal to the anterolateral portal triangulating.  Hypertrophic bursa was noted in the subacromial space, we performed a full bursectomy with a  shaver.  No evidence of tearing on the out surface of the rotator cuff.  It was extensively probed.  Shaving the small anterolateral aspect of the acromion.  Scar tissue was noted in the subacromial space as well.  We debrided that.  The tendon was intact.  There was excellent space following the decompression.  Next all instrumentation was removed. Portals were closed with 4-0 nylon simple sutures, a 0.25%  Marcaine with epinephrine was infiltrated in the joint.  Wound was dressed sterilely. She was awoken without difficulty, placed in a sling, and extubated without difficulty and transported to Recovery in satisfactory condition.  The patient tolerated the procedure well.  No complications.  Assistant was [employer].     Jene Every, M.D.     [name]  D:  03/12/2011  T:  03/13/2011  Job:  [id number]  Electronically Signed by Jene Every M.D. on 03/26/2011 03:39:56 PM

## 2011-04-16 ENCOUNTER — Other Ambulatory Visit: Payer: Self-pay | Admitting: Family Medicine

## 2011-05-20 ENCOUNTER — Other Ambulatory Visit: Payer: Self-pay | Admitting: Family Medicine

## 2011-05-29 ENCOUNTER — Telehealth: Payer: Self-pay | Admitting: Family Medicine

## 2011-05-29 NOTE — Telephone Encounter (Signed)
Please advise ok to give samples.

## 2011-05-29 NOTE — Telephone Encounter (Signed)
Ok to give a sample

## 2011-05-29 NOTE — Telephone Encounter (Signed)
Patient wants samples of lantis - 25 units am - 75 units pm ----- humalog 15 units 3 times a day

## 2011-05-30 NOTE — Telephone Encounter (Signed)
Pt aware sample placed in refrigerator door for pickup

## 2011-05-31 ENCOUNTER — Other Ambulatory Visit: Payer: Self-pay | Admitting: Family Medicine

## 2011-06-15 ENCOUNTER — Ambulatory Visit: Payer: Self-pay | Admitting: Family Medicine

## 2011-06-21 ENCOUNTER — Ambulatory Visit (INDEPENDENT_AMBULATORY_CARE_PROVIDER_SITE_OTHER): Payer: Medicare HMO | Admitting: Family Medicine

## 2011-06-21 ENCOUNTER — Encounter: Payer: Self-pay | Admitting: Family Medicine

## 2011-06-21 DIAGNOSIS — M25519 Pain in unspecified shoulder: Secondary | ICD-10-CM

## 2011-06-21 DIAGNOSIS — J329 Chronic sinusitis, unspecified: Secondary | ICD-10-CM

## 2011-06-21 MED ORDER — HYDROCODONE-ACETAMINOPHEN 5-325 MG PO TABS: 1.0000 | ORAL_TABLET | Freq: Four times a day (QID) | ORAL | Status: DC | PRN

## 2011-06-21 MED ORDER — BECLOMETHASONE DIPROPIONATE 80 MCG/ACT NA AERS: 2.0000 | INHALATION_SPRAY | Freq: Once | NASAL | Status: DC

## 2011-06-21 MED ORDER — CYCLOBENZAPRINE HCL 10 MG PO TABS: 10.0000 mg | ORAL_TABLET | Freq: Three times a day (TID) | ORAL | Status: DC | PRN

## 2011-06-21 MED ORDER — CEFUROXIME AXETIL 500 MG PO TABS: 500.0000 mg | ORAL_TABLET | Freq: Two times a day (BID) | ORAL | Status: AC

## 2011-06-21 NOTE — Patient Instructions (Addendum)
Sinusitis Sinuses are air pockets within the bones of your face. The growth of bacteria within a sinus leads to infection. Infection keeps the sinuses from draining. This infection is called sinusitis. SYMPTOMS There will be different areas of pain depending on which sinuses have become infected.  The maxillary sinuses often produce pain beneath the eyes.   Frontal sinusitis may cause pain in the middle of the forehead and above the eyes.  Other problems (symptoms) include:  Toothaches.   Colored, pus-like (purulent) drainage from the nose.   Any swelling, warmth, or tenderness over the sinus areas may be signs of infection.  TREATMENT Sinusitis is most often determined by an exam and you may have x-rays taken. If x-rays have been taken, make sure you obtain your results. Or find out how you are to obtain them. Your caregiver may give you medications (antibiotics). These are medications that will help kill the infection. You may also be given a medication (decongestant) that helps to reduce sinus swelling.  HOME CARE INSTRUCTIONS  Only take over-the-counter or prescription medicines for pain, discomfort, or fever as directed by your caregiver.   Drink extra fluids. Fluids help thin the mucus so your sinuses can drain more easily.   Applying either moist heat or ice packs to the sinus areas may help relieve discomfort.   Use saline nasal sprays to help moisten your sinuses. The sprays can be found at your local drugstore.  SEEK IMMEDIATE MEDICAL CARE IF YOU DEVELOP:  High fever that is still present after two days of antibiotic treatment.   Increasing pain, severe headaches, or toothache.   Nausea, vomiting, or drowsiness.   Unusual swelling around the face or trouble seeing.  MAKE SURE YOU:   Understand these instructions.   Will watch your condition.   Will get help right away if you are not doing well or get worse.  Document Released: 09/24/2005 Document Re-Released:  09/06/2008 [facility] Patient Information 2011 [place], [place].  Shoulder Pain You were seen today with shoulder pain. The shoulder is a ball and socket joint. The muscles and tendons (rotator cuff) are what keep the shoulder in its joint and stable. This collection of muscles and tendons holds in the head (ball) of the humerus (upper arm bone) in the fossa (cup) of the scapula (shoulder blade). Today no reason was found for your shoulder pain. Often pain in the shoulder may be treated conservatively with temporary immobilization. For example, holding the shoulder in one place using a sling for rest. Physical therapy may be needed if problems continue. HOME CARE INSTRUCTIONS  Apply ice to the sore area for 15-20 minutes 2-3 times per day for the first 2 days. Put the ice in a plastic bag. Place a towel between the bag of ice and your skin.   If you have or were given a shoulder sling and straps, do not remove for several days or until you see a caregiver for a follow-up examination. If you need to remove it to shower or bathe, move your arm as little as possible.   You may want to sleep on several pillows at night to lessen swelling and pain.   Only take over-the-counter or prescription medicines for pain, discomfort, or fever as directed by your caregiver.   It is very important to keep any follow-up appointments in order to avoid any type of permanent shoulder disability or chronic pain problems.  SEEK MEDICAL CARE IF:  Pain in your shoulder increases or new pain develops  in your arm, hand, or fingers.   Your hand or fingers are colder than your other hand.   You do not obtain pain relief with the medications or your pain becomes worse.  SEEK IMMEDIATE MEDICAL CARE IF:  Your arm, hand, or fingers are numb or tingling.   Your arm, hand, or fingers are swollen, painful, or turn white or blue.   You develop chest pain or shortness of breath.  MAKE SURE YOU:   Understand these  instructions.   Will watch your condition.   Will get help right away if you are not doing well or get worse.  Document Released: 07/04/2005 Document Re-Released: 12/21/2008 [facility] Patient Information 2011 [place], [place].

## 2011-06-22 ENCOUNTER — Encounter: Payer: Self-pay | Admitting: Family Medicine

## 2011-06-22 NOTE — Progress Notes (Signed)
  Subjective:     [name] is a 65 y.o. female who presents for evaluation of sinus pain. Symptoms include: congestion, facial pain, nasal congestion and sinus pressure. Onset of symptoms was 1 week ago. Symptoms have been gradually worsening since that time. Past history is significant for no history of pneumonia or bronchitis. Patient is a non-smoker.  Pt also c/o L shoulder pain.  She woke up one day a few weeks ago and had trouble lifting arm.  No known injury.  No CP or sob..  The following portions of the patient's history were reviewed and updated as appropriate: allergies, current medications, past family history, past medical history, past social history, past surgical history and problem list.  Review of Systems Pertinent items are noted in HPI.   Objective:    BP 144/74  Pulse 54  Temp(Src) 98.8 F (37.1 C) (Oral)  Wt 170 lb 12.8 oz (77.474 kg)  SpO2 98% General appearance: alert, cooperative, appears stated age and no distress Ears: normal TM's and external ear canals both ears Nose: Nares normal. Septum midline. Mucosa normal. No drainage or sinus tenderness., moderate congestion, turbinates red, swollen, sinus tenderness bilateral Throat: lips, mucosa, and tongue normal; teeth and gums normal Neck: mild anterior cervical adenopathy, supple, symmetrical, trachea midline and thyroid not enlarged, symmetric, no tenderness/mass/nodules Lungs: clear to auscultation bilaterally Heart: S1, S2 normal Extremities: extremities normal, atraumatic, no cyanosis or edema Lymph nodes: Cervical adenopathy: b/l M/S-- R shoulder normal rom,  L shoulder pain with abduction past 90 degrees and pain with palpation upper Left arm    Assessment:    Acute bacterial sinusitis.   L shoulder pain--- sling, ice, flexeril and percocet for pm---PT vs ortho if no relief Plan:    Nasal steroids per medication orders. Ceftin per medication orders.

## 2011-06-27 ENCOUNTER — Other Ambulatory Visit: Payer: Self-pay

## 2011-06-27 MED ORDER — AMLODIPINE BESYLATE 10 MG PO TABS: 10.0000 mg | ORAL_TABLET | Freq: Every day | ORAL | Status: DC

## 2011-06-27 MED ORDER — BENAZEPRIL HCL 20 MG PO TABS: 20.0000 mg | ORAL_TABLET | Freq: Every day | ORAL | Status: DC

## 2011-07-05 LAB — POCT I-STAT, CHEM 8
Creatinine, Ser: 0.8
Hemoglobin: 12.9
Sodium: 137
TCO2: 26

## 2011-07-05 LAB — CBC
Hemoglobin: 12.3
RBC: 4.08
WBC: 8.1

## 2011-07-05 LAB — DIFFERENTIAL
Lymphocytes Relative: 37
Lymphs Abs: 3
Monocytes Absolute: 0.5
Monocytes Relative: 6
Neutro Abs: 4.6
Neutrophils Relative %: 57

## 2011-07-05 LAB — POCT CARDIAC MARKERS: Myoglobin, poc: 84.5

## 2011-07-08 ENCOUNTER — Emergency Department (HOSPITAL_COMMUNITY): Payer: Worker's Compensation

## 2011-07-08 ENCOUNTER — Emergency Department (HOSPITAL_COMMUNITY)
Admission: EM | Admit: 2011-07-08 | Discharge: 2011-07-08 | Disposition: A | Discharge status: Home / Self Care | Payer: Worker's Compensation | Attending: Emergency Medicine | Admitting: Emergency Medicine

## 2011-07-08 DIAGNOSIS — I2510 Atherosclerotic heart disease of native coronary artery without angina pectoris: Secondary | ICD-10-CM | POA: Insufficient documentation

## 2011-07-08 DIAGNOSIS — Z794 Long term (current) use of insulin: Secondary | ICD-10-CM | POA: Insufficient documentation

## 2011-07-08 DIAGNOSIS — S4980XA Other specified injuries of shoulder and upper arm, unspecified arm, initial encounter: Secondary | ICD-10-CM | POA: Insufficient documentation

## 2011-07-08 DIAGNOSIS — Y92009 Unspecified place in unspecified non-institutional (private) residence as the place of occurrence of the external cause: Secondary | ICD-10-CM | POA: Insufficient documentation

## 2011-07-08 DIAGNOSIS — W010XXA Fall on same level from slipping, tripping and stumbling without subsequent striking against object, initial encounter: Secondary | ICD-10-CM | POA: Insufficient documentation

## 2011-07-08 DIAGNOSIS — S46909A Unspecified injury of unspecified muscle, fascia and tendon at shoulder and upper arm level, unspecified arm, initial encounter: Secondary | ICD-10-CM | POA: Insufficient documentation

## 2011-07-08 DIAGNOSIS — M719 Bursopathy, unspecified: Secondary | ICD-10-CM | POA: Insufficient documentation

## 2011-07-08 DIAGNOSIS — E119 Type 2 diabetes mellitus without complications: Secondary | ICD-10-CM | POA: Insufficient documentation

## 2011-07-08 DIAGNOSIS — I10 Essential (primary) hypertension: Secondary | ICD-10-CM | POA: Insufficient documentation

## 2011-07-08 DIAGNOSIS — M67919 Unspecified disorder of synovium and tendon, unspecified shoulder: Secondary | ICD-10-CM | POA: Insufficient documentation

## 2011-07-10 ENCOUNTER — Encounter: Payer: Self-pay | Admitting: Family Medicine

## 2011-07-10 ENCOUNTER — Ambulatory Visit (INDEPENDENT_AMBULATORY_CARE_PROVIDER_SITE_OTHER): Payer: Medicare HMO | Admitting: Family Medicine

## 2011-07-10 VITALS — BP 142/82 | HR 71 | Temp 98.6°F | Wt 171.2 lb

## 2011-07-10 DIAGNOSIS — M25512 Pain in left shoulder: Secondary | ICD-10-CM

## 2011-07-10 DIAGNOSIS — M25519 Pain in unspecified shoulder: Secondary | ICD-10-CM

## 2011-07-10 LAB — GLUCOSE, CAPILLARY
Glucose-Capillary: 155 — ABNORMAL HIGH
Glucose-Capillary: 185 — ABNORMAL HIGH
Glucose-Capillary: 187 — ABNORMAL HIGH
Glucose-Capillary: 287 — ABNORMAL HIGH

## 2011-07-10 LAB — URINE MICROSCOPIC-ADD ON

## 2011-07-10 LAB — DIFFERENTIAL
Eosinophils Absolute: 0.1
Eosinophils Relative: 1
Lymphs Abs: 2.6
Monocytes Relative: 8
Neutrophils Relative %: 60

## 2011-07-10 LAB — LIPID PANEL
HDL: 23 — ABNORMAL LOW
LDL Cholesterol: UNDETERMINED
Total CHOL/HDL Ratio: 8
VLDL: UNDETERMINED

## 2011-07-10 LAB — LIPASE, BLOOD: Lipase: 58

## 2011-07-10 LAB — URINALYSIS, ROUTINE W REFLEX MICROSCOPIC
Glucose, UA: >1000 — AB
Hgb urine dipstick: NEGATIVE
Protein, ur: 30 — AB
Specific Gravity, Urine: 1.02

## 2011-07-10 LAB — CBC
HCT: 34.8 — ABNORMAL LOW
MCV: 88
RBC: 3.95
WBC: 8.5

## 2011-07-10 LAB — COMPREHENSIVE METABOLIC PANEL
Albumin: 3.9
BUN: 19
Creatinine, Ser: 0.72
Total Protein: 6.8

## 2011-07-10 MED ORDER — HYDROCODONE-ACETAMINOPHEN 7.5-750 MG PO TABS: 1.0000 | ORAL_TABLET | Freq: Four times a day (QID) | ORAL | Status: DC | PRN

## 2011-07-10 NOTE — Patient Instructions (Signed)
Shoulder Pain You were seen today with shoulder pain. The shoulder is a ball and socket joint. The muscles and tendons (rotator cuff) are what keep the shoulder in its joint and stable. This collection of muscles and tendons holds in the head (ball) of the humerus (upper arm bone) in the fossa (cup) of the scapula (shoulder blade). Today no reason was found for your shoulder pain. Often pain in the shoulder may be treated conservatively with temporary immobilization. For example, holding the shoulder in one place using a sling for rest. Physical therapy may be needed if problems continue. HOME CARE INSTRUCTIONS  Apply ice to the sore area for 15 minutes 2-3 times per day for the first 2 days. Put the ice in a plastic bag. Place a towel between the bag of ice and your skin.   If you have or were given a shoulder sling and straps, do not remove  until you see a caregiver for a follow-up examination. If you need to remove it to shower or bathe, move your arm as little as possible.   You may want to sleep on several pillows at night to lessen swelling and pain.   Only take over-the-counter or prescription medicines for pain, discomfort, or fever as directed by your caregiver.   It is very important to keep any follow-up appointments in order to avoid any type of permanent shoulder disability or chronic pain problems.  SEEK MEDICAL CARE IF:  Pain in your shoulder increases or new pain develops in your arm, hand, or fingers.   Your hand or fingers are colder than your other hand.   You do not obtain pain relief with the medications or your pain becomes worse.  SEEK IMMEDIATE MEDICAL CARE IF:  Your arm, hand, or fingers are numb or tingling.   Your arm, hand, or fingers are swollen, painful, or turn white or blue.   You develop chest pain or shortness of breath.  MAKE SURE YOU:   Understand these instructions.   Will watch your condition.   Will get help right away if you are not doing well  or get worse.  Document Released: 07/04/2005 Document Re-Released: 12/21/2008 [facility] Patient Information 2011 [place], [place].

## 2011-07-10 NOTE — Progress Notes (Signed)
  Subjective:    [name] is a 65 y.o. female who presents with left shoulder pain. The symptoms began a week ago. Aggravating factors: Pt fell on Thurs--she tripped and fell on shoulder. Pain is located in the Left glenohumeral region. Discomfort is described as sharp/stabbing. Symptoms are exacerbated by repetitive movements, overhead movements and lying on the shoulder. Evaluation to date: plain films: normal. Therapy to date includes: nothing specific.  The following portions of the patient's history were reviewed and updated as appropriate: allergies, current medications, past family history, past medical history, past social history, past surgical history and problem list.  Review of Systems Pertinent items are noted in HPI.   Objective:    BP 142/82  Pulse 71  Temp(Src) 98.6 F (37 C) (Oral)  Wt 171 lb 3.2 oz (77.656 kg)  SpO2 99% Right shoulder: non-specific diffuse tenderness about the shoulder, sensory exam normal, radial pulse intact and pain with abduction  Left shoulder: normal active ROM, no tenderness, no impingement sign     Assessment:    Left shoulder pain    Plan:    Educational material distributed. Reduction in offending activity. Rest, ice, compression, and elevation (RICE) therapy. Orthopedics referral.

## 2011-07-30 ENCOUNTER — Other Ambulatory Visit: Payer: Self-pay | Admitting: Family Medicine

## 2011-08-07 ENCOUNTER — Ambulatory Visit: Payer: Self-pay | Admitting: Family Medicine

## 2011-08-09 ENCOUNTER — Telehealth: Payer: Self-pay | Admitting: Family Medicine

## 2011-08-09 ENCOUNTER — Emergency Department (HOSPITAL_COMMUNITY)
Admission: EM | Admit: 2011-08-09 | Discharge: 2011-08-10 | Disposition: A | Discharge status: Home / Self Care | Payer: Medicare HMO | Attending: Emergency Medicine | Admitting: Emergency Medicine

## 2011-08-09 ENCOUNTER — Other Ambulatory Visit: Payer: Self-pay | Admitting: Family Medicine

## 2011-08-09 DIAGNOSIS — M79609 Pain in unspecified limb: Secondary | ICD-10-CM | POA: Insufficient documentation

## 2011-08-09 DIAGNOSIS — Z794 Long term (current) use of insulin: Secondary | ICD-10-CM | POA: Insufficient documentation

## 2011-08-09 DIAGNOSIS — I10 Essential (primary) hypertension: Secondary | ICD-10-CM | POA: Insufficient documentation

## 2011-08-09 DIAGNOSIS — I2510 Atherosclerotic heart disease of native coronary artery without angina pectoris: Secondary | ICD-10-CM | POA: Insufficient documentation

## 2011-08-09 DIAGNOSIS — E119 Type 2 diabetes mellitus without complications: Secondary | ICD-10-CM | POA: Insufficient documentation

## 2011-08-09 NOTE — Telephone Encounter (Signed)
Sample left in the fridge and patient has been made aware     KP

## 2011-08-09 NOTE — Telephone Encounter (Signed)
Patient wants samples of humalog & lantis

## 2011-08-10 ENCOUNTER — Ambulatory Visit (HOSPITAL_COMMUNITY)
Admission: RE | Admit: 2011-08-10 | Discharge: 2011-08-10 | Disposition: A | Discharge status: Home / Self Care | Payer: Worker's Compensation | Source: Ambulatory Visit | Attending: Emergency Medicine | Admitting: Emergency Medicine

## 2011-08-10 DIAGNOSIS — M79609 Pain in unspecified limb: Secondary | ICD-10-CM

## 2011-08-10 LAB — POCT I-STAT, CHEM 8
BUN: 20 mg/dL (ref 6–23)
Calcium, Ion: 1.21 mmol/L (ref 1.12–1.32)
Creatinine, Ser: 0.6 mg/dL (ref 0.50–1.10)
Glucose, Bld: 220 mg/dL — ABNORMAL HIGH (ref 70–99)
TCO2: 22 mmol/L (ref 0–100)

## 2011-08-10 LAB — D-DIMER, QUANTITATIVE: D-Dimer, Quant: 1.03 ug/mL-FEU — ABNORMAL HIGH (ref 0.00–0.48)

## 2011-08-10 NOTE — Telephone Encounter (Signed)
Last seen and filled 07/10/11 please advise    KP

## 2011-08-17 ENCOUNTER — Other Ambulatory Visit: Payer: Self-pay | Admitting: Family Medicine

## 2011-08-23 ENCOUNTER — Encounter: Payer: Self-pay | Admitting: Family Medicine

## 2011-08-27 ENCOUNTER — Ambulatory Visit: Payer: Self-pay | Admitting: Family Medicine

## 2011-08-27 ENCOUNTER — Encounter: Payer: Self-pay | Admitting: Family Medicine

## 2011-08-27 ENCOUNTER — Ambulatory Visit (INDEPENDENT_AMBULATORY_CARE_PROVIDER_SITE_OTHER): Payer: Worker's Compensation | Admitting: Family Medicine

## 2011-08-27 VITALS — BP 136/82 | HR 76 | Temp 98.6°F | Wt 177.0 lb

## 2011-08-27 DIAGNOSIS — Z01818 Encounter for other preprocedural examination: Secondary | ICD-10-CM

## 2011-08-27 DIAGNOSIS — Z111 Encounter for screening for respiratory tuberculosis: Secondary | ICD-10-CM

## 2011-08-27 DIAGNOSIS — M25519 Pain in unspecified shoulder: Secondary | ICD-10-CM

## 2011-08-27 MED ORDER — HYDROCODONE-ACETAMINOPHEN 7.5-750 MG PO TABS: ORAL_TABLET | ORAL | Status: DC

## 2011-08-27 NOTE — Progress Notes (Signed)
Subjective:    [name] is a 65 y.o. female who presents to the office today for a preoperative consultation at the request of surgeon Dr Ranell Patrick who plans on performing left Shoulder: SA-SAD Scope with partial acromioplasty w/wo coracoacromial release, biceps tenoddesis on TBA -. This consultation is requested for the specific conditions prompting preoperative evaluation (i.e. because of potential affect on operative risk): DM, HTN, hyperlipidemia. Planned anesthesia: general. The patient has the following known anesthesia issues: none. Patients bleeding risk: use of Ca-channel blockers (see med list). Patient does not have objections to receiving blood products if needed.  Past Medical History  Diagnosis Date  . Diabetes mellitus   . Hypertension   . Hyperlipidemia   . Depression   . Restless leg syndrome    History   Social History  . Marital Status: Married    Spouse Name: N/A    Number of Children: N/A  . Years of Education: N/A   Occupational History  . Not on file.   Social History Main Topics  . Smoking status: Current Everyday Smoker  . Smokeless tobacco: Never Used  . Alcohol Use: No  . Drug Use: No  . Sexually Active: Not on file   Other Topics Concern  . Not on file   Social History Narrative  . No narrative on file   Allergies  Allergen Reactions  . Levemir (Insulin Detemir)    Current Outpatient Prescriptions on File Prior to Visit  Medication Sig Dispense Refill  . amLODipine (NORVASC) 10 MG tablet Take 1 tablet (10 mg total) by mouth daily.  30 tablet  2  . aspirin 324 MG buffered tablet Take 324 mg by mouth daily.        [name] atorvastatin (LIPITOR) 20 MG tablet Take 20 mg by mouth at bedtime.        . Beclomethasone Dipropionate (QNASL) 80 MCG/ACT AERS Place 2 sprays into the nose once. Daily   1 Inhaler  0  . benazepril (LOTENSIN) 20 MG tablet Take 1 tablet (20 mg total) by mouth daily.  30 tablet  2  . citalopram (CELEXA) 20 MG tablet Take 20  mg by mouth daily.        . cyclobenzaprine (FLEXERIL) 10 MG tablet Take 1 tablet (10 mg total) by mouth 3 (three) times daily as needed.  30 tablet  0  . HUMALOG 100 UNIT/ML injection inject 5 units three times a day as directed  10 mL  0  . HYDROcodone-acetaminophen (NORCO) 5-325 MG per tablet Take 1 tablet by mouth every 6 (six) hours as needed.  30 tablet  0  . HYDROcodone-acetaminophen (VICODIN ES) 7.5-750 MG per tablet take 1 tablet by mouth every 6 hours if needed for pain  20 tablet  0  . Insulin Pen Needle (PEN NEEDLES 31GX5/16") 31G X 8 MM MISC by Does not apply route daily.        . Insulin Syringe-Needle U-100 (BD INSULIN SYRINGE ULTRAFINE) 31G X 5/16" 0.3 ML MISC 10 Units by Does not apply route 3 (three) times daily before meals.        . Lancets MISC       . LANTUS SOLOSTAR 100 UNIT/ML injection INJECT 45 UNITS SUBCUTANEOUSLY EVERY EVENING.  3 mL  1  . LOVAZA 1 G capsule TAKE 2 CAPSULES BY MOUTH TWICE A DAY  120 capsule  0  . metFORMIN (GLUCOPHAGE) 1000 MG tablet take 1 tablet by mouth twice a day  60 tablet  0  . metoprolol (TOPROL-XL) 50 MG 24 hr tablet take 1 tablet by mouth once daily  30 tablet  0  . PRODIGY NO CODING BLOOD GLUC test strip TEST BLOOD SUGAR UP TO 3 TIMES A DAY OR AS DIRECTED.  100 each  1  . vitamin E 100 UNIT capsule Take 100 Units by mouth daily.         Family History  Problem Relation Age of Onset  . Lung cancer    . Breast cancer    . Thyroid cancer    . Uterine cancer    . Cancer Other     lung,thyroid,breast,uterine    Review of Systems Review of Systems  Constitutional: Negative for activity change, appetite change and fatigue.  HENT: Negative for hearing loss, congestion, tinnitus and ear discharge.   Eyes: Negative for visual disturbance  Respiratory: Negative for cough, chest tightness and shortness of breath.   Cardiovascular: Negative for chest pain, palpitations and leg swelling.  Gastrointestinal: Negative for abdominal pain,  diarrhea, constipation and abdominal distention.  Genitourinary: Negative for urgency, frequency, decreased urine volume and difficulty urinating.  Musculoskeletal: Negative for back pain, and gait problem.  L shoulder pain Skin: Negative for color change, pallor and rash.  Neurological: Negative for dizziness, light-headedness, numbness and headaches.  Hematological: Negative for adenopathy. Does not bruise/bleed easily.  Psychiatric/Behavioral: Negative for suicidal ideas, confusion, sleep disturbance, self-injury, dysphoric mood, decreased concentration and agitation.        Objective:    BP 136/82  Pulse 76  Temp(Src) 98.6 F (37 C) (Oral)  Wt 177 lb (80.287 kg)  SpO2 98% General appearance: alert, cooperative, appears stated age and no distress Head: Normocephalic, without obvious abnormality, atraumatic Eyes: negative findings: lids and lashes normal, conjunctivae and sclerae normal and pupils equal, round, reactive to light and accomodation, conjunctivae/corneas clear. PERRL, EOM&#39;s intact. Fundi benign. Ears: normal TM's and external ear canals both ears and normal TM&#39;s and external ear canals both ears Nose: Nares normal. Septum midline. Mucosa normal. No drainage or sinus tenderness. Throat: lips, mucosa, and tongue normal; teeth and gums normal Neck: no adenopathy, no carotid bruit, no JVD, supple, symmetrical, trachea midline and thyroid not enlarged, symmetric, no tenderness/mass/nodules Lungs: clear to auscultation bilaterally Heart: S1, S2 normal and + murmur  2/6 Abdomen: soft, non-tender; bowel sounds normal; no masses,  no organomegaly Extremities: pain with rom L shoulder Pulses: 2+ and symmetric Skin: Skin color, texture, turgor normal. No rashes or lesions Lymph nodes: Cervical, supraclavicular, and axillary nodes normal. Neurologic: Mental status: Alert, oriented, thought content appropriate Cranial nerves: normal Sensory: normal  Predictors of  intubation difficulty:  Morbid obesity? no  Anatomically abnormal facies? no  Prominent incisors? no    Short, thick neck? no  Neck range of motion: normal      Dentition: No chipped, loose, or missing teeth.  Cardiographics ECG: no change since previous ECG dated 12/2010 Echocardiogram: not done  Imaging Chest x-ray: not done   Lab Review  Admission on 08/09/2011, Discharged on 08/10/2011  Component Date Value  . Sodium 08/10/2011 138   . Potassium 08/10/2011 3.7   . Chloride 08/10/2011 104   . BUN 08/10/2011 20   . Creatinine, Ser 08/10/2011 0.60   . Glucose, Bld 08/10/2011 220*  . Calcium, Ion 08/10/2011 1.21   . TCO2 08/10/2011 22   . Hemoglobin 08/10/2011 12.9   . HCT 08/10/2011 38.0   . D-Dimer, Quant 08/10/2011 1.03*  Admission on 03/12/2011, Discharged on  03/12/2011  Component Date Value  . WBC 03/12/2011 8.0   . RBC 03/12/2011 4.28   . Hemoglobin 03/12/2011 12.5   . HCT 03/12/2011 37.1   . MCV 03/12/2011 86.7   . MCH 03/12/2011 29.2   . MCHC 03/12/2011 33.7   . RDW 03/12/2011 12.8   . Platelets 03/12/2011 209   . Sodium 03/12/2011 137   . Potassium 03/12/2011 4.0   . Chloride 03/12/2011 100   . CO2 03/12/2011 26   . Glucose, Bld 03/12/2011 167*  . BUN 03/12/2011 14   . Creatinine, Ser 03/12/2011 0.71   . Calcium 03/12/2011 10.4   . Total Protein 03/12/2011 7.4   . Albumin 03/12/2011 4.1   . AST 03/12/2011 19   . ALT 03/12/2011 23   . Alkaline Phosphatase 03/12/2011 72   . Total Bilirubin 03/12/2011 0.9   . GFR calc non Af Amer 03/12/2011 >60   . GFR calc Af Amer 03/12/2011                     Value:>60                                The eGFR has been calculated                         using the MDRD equation.                         This calculation has not been                         validated in all clinical                         situations.                         eGFR's persistently                         <60 mL/min signify                          possible Chronic Kidney Disease.  . Glucose-Capillary 03/12/2011 158*  . Glucose-Capillary 03/12/2011 104*  . Comment 1 03/12/2011 Notify RN   . Comment 2 03/12/2011 Documented in Chart       Assessment:      65 y.o. female with planned surgery as above.   Known risk factors for perioperative complications: Diabetes mellitus   Difficulty with intubation is not anticipated.      Plan:    1. Preoperative workup as follows per surgeon. 2. Change in medication regimen before surgery: none, continue medication regimen including morning of surgery, with sip of water. 3. Prophylaxis for cardiac events with perioperative beta-blockers: pt is on beta blocker. 4. Invasive hemodynamic monitoring perioperatively: at the discretion of anesthesiologist. 5. Deep vein thrombosis prophylaxis postoperatively:regimen to be chosen by surgical team. 6. Surveillance for postoperative MI with ECG immediately postoperatively and on postoperative days 1 and 2 AND troponin levels 24 hours postoperatively and on day 4 or hospital discharge (whichever comes first): at the discretion of anesthesiologist. 7. Other measures: Consultation with hospitalist if needed for in-hospital postoperative management of diabetes.

## 2011-09-04 ENCOUNTER — Ambulatory Visit (INDEPENDENT_AMBULATORY_CARE_PROVIDER_SITE_OTHER): Payer: Medicare HMO

## 2011-09-04 DIAGNOSIS — Z111 Encounter for screening for respiratory tuberculosis: Secondary | ICD-10-CM

## 2011-09-09 ENCOUNTER — Other Ambulatory Visit: Payer: Self-pay | Admitting: Family Medicine

## 2011-10-01 ENCOUNTER — Other Ambulatory Visit: Payer: Self-pay | Admitting: Family Medicine

## 2011-10-01 MED ORDER — INSULIN LISPRO 100 UNIT/ML ~~LOC~~ SOLN: 5.0000 [IU] | Freq: Three times a day (TID) | SUBCUTANEOUS | Status: DC

## 2011-10-01 NOTE — Telephone Encounter (Signed)
Patient needs refill humalog - [facility] [address] 30 day supply

## 2011-10-07 ENCOUNTER — Other Ambulatory Visit: Payer: Self-pay | Admitting: Family Medicine

## 2011-10-09 ENCOUNTER — Other Ambulatory Visit: Payer: Self-pay | Admitting: Family Medicine

## 2011-10-10 ENCOUNTER — Other Ambulatory Visit: Payer: Self-pay | Admitting: Family Medicine

## 2011-10-11 ENCOUNTER — Telehealth: Payer: Self-pay | Admitting: *Deleted

## 2011-10-11 NOTE — Telephone Encounter (Signed)
erin from [address] called to ask if pt can now have the humalog quick pen instead of the vial she usually receives per pt insurance has been switched, spoke with MD Lowne and was given verbal orders to allow pt to start using the quick pen with same directions, 5 units TID, advised pharm tech verbal order. Pt aware

## 2011-10-27 ENCOUNTER — Emergency Department (HOSPITAL_COMMUNITY)
Admission: EM | Admit: 2011-10-27 | Discharge: 2011-10-27 | Disposition: A | Discharge status: Home / Self Care | Payer: Medicare HMO | Source: Home / Self Care | Attending: Family Medicine | Admitting: Family Medicine

## 2011-10-27 ENCOUNTER — Encounter (HOSPITAL_COMMUNITY): Payer: Self-pay | Admitting: Emergency Medicine

## 2011-10-27 DIAGNOSIS — N760 Acute vaginitis: Secondary | ICD-10-CM

## 2011-10-27 DIAGNOSIS — A499 Bacterial infection, unspecified: Secondary | ICD-10-CM

## 2011-10-27 DIAGNOSIS — J329 Chronic sinusitis, unspecified: Secondary | ICD-10-CM

## 2011-10-27 DIAGNOSIS — B9689 Other specified bacterial agents as the cause of diseases classified elsewhere: Secondary | ICD-10-CM

## 2011-10-27 LAB — WET PREP, GENITAL
Trich, Wet Prep: NONE SEEN
Yeast Wet Prep HPF POC: NONE SEEN

## 2011-10-27 MED ORDER — AMOXICILLIN-POT CLAVULANATE 875-125 MG PO TABS: 1.0000 | ORAL_TABLET | Freq: Two times a day (BID) | ORAL | Status: AC

## 2011-10-27 MED ORDER — FLUCONAZOLE 150 MG PO TABS: 150.0000 mg | ORAL_TABLET | Freq: Once | ORAL | Status: AC

## 2011-10-27 MED ORDER — METRONIDAZOLE 500 MG PO TABS: 500.0000 mg | ORAL_TABLET | Freq: Two times a day (BID) | ORAL | Status: AC

## 2011-10-27 NOTE — ED Notes (Signed)
Multiple complaints.  Reports dry, burning in nose, blowing green sinus drainage.  Patient denies coughing, denies fever.  Sinus issues going on for 2 weeks.

## 2011-10-27 NOTE — ED Provider Notes (Signed)
History     CSN: [id number]  Arrival date & time 10/27/11  4098   First MD Initiated Contact with Patient 10/27/11 1020      Chief Complaint  Patient presents with  . Nasal Congestion    (Consider location/radiation/quality/duration/timing/severity/associated sxs/prior treatment) HPI Comments: Carianna presents for evaluation of nasal congestion, sinus pressure, rhinorrhea, and also vaginal discharge x 2 weeks. She denies any new sexual partner, she is married, no new soaps, and no antibiotic use. She reports a hx of allergies, but does not take anything. She states that she tried a one-day Monistat course last week without relief.   Patient is a 66 y.o. female presenting with sinusitis and female genitourinary complaint. The history is provided by the patient.  Sinusitis  This is a new problem. The current episode started more than 1 week ago. The problem has not changed since onset.There has been no fever. The pain has been constant since onset. Associated symptoms include congestion and sinus pressure. Pertinent negatives include no cough. She has tried nothing for the symptoms.  Female GU Problem Primary symptoms include discharge.  Primary symptoms include no genital itching, no genital odor, no dysuria and no vaginal bleeding. There has been no fever. The fever has been present for 5 days or more. This is a new problem. The problem occurs constantly. The problem has not changed since onset.The symptoms occur spontaneously. She is not pregnant. She has not missed her period. Pertinent negatives include no frequency. She has tried anti-fungal pills for the symptoms. The treatment provided no relief.    Past Medical History  Diagnosis Date  . Diabetes mellitus   . Hypertension   . Hyperlipidemia   . Depression   . Restless leg syndrome     Past Surgical History  Procedure Date  . Tubal ligation   . Rotator cuff repair     right    Family History  Problem Relation Age of  Onset  . Lung cancer    . Breast cancer    . Thyroid cancer    . Uterine cancer    . Cancer Other     lung,thyroid,breast,uterine    History  Substance Use Topics  . Smoking status: Current Everyday Smoker  . Smokeless tobacco: Never Used  . Alcohol Use: No    OB History    Grav Para Term Preterm Abortions TAB SAB Ect Mult Living                  Review of Systems  Constitutional: Negative.   HENT: Positive for congestion and sinus pressure.   Eyes: Negative.   Respiratory: Negative.  Negative for cough.   Cardiovascular: Negative.   Gastrointestinal: Negative.   Genitourinary: Positive for vaginal discharge. Negative for dysuria, urgency, frequency and vaginal bleeding.  Musculoskeletal: Negative.   Skin: Negative.   Neurological: Negative.     Allergies  Levemir  Home Medications     BP 168/83  Pulse 68  Temp(Src) 97.8 F (36.6 C) (Oral)  Resp 19  SpO2 100%  Physical Exam  Nursing note and vitals reviewed. Constitutional: She is oriented to person, place, and time. She appears well-developed and well-nourished.  HENT:  Head: Normocephalic and atraumatic.  Right Ear: Tympanic membrane is retracted.  Left Ear: Tympanic membrane is retracted.  Mouth/Throat: Uvula is midline, oropharynx is clear and moist and mucous membranes are normal.  Eyes: EOM are normal.  Neck: Normal range of motion.  Pulmonary/Chest: Effort normal.  Genitourinary: Cervix  exhibits no motion tenderness and no discharge. Vaginal discharge found.  Musculoskeletal: Normal range of motion.  Neurological: She is alert and oriented to person, place, and time.  Skin: Skin is warm and dry.  Psychiatric: Her behavior is normal.    ED Course  Procedures (including critical care time)   Labs Reviewed  GC/CHLAMYDIA PROBE AMP, GENITAL  WET PREP, GENITAL   No results found.   1. Sinusitis   2. Bacterial vaginosis       MDM  Will treat for sinusitis and BV Labs  reviewed        Richardo Priest, MD 10/27/11 1135

## 2011-10-27 NOTE — ED Notes (Signed)
Instructed to undress and put on gown for exam

## 2011-10-30 ENCOUNTER — Other Ambulatory Visit: Payer: Self-pay

## 2011-10-30 LAB — GC/CHLAMYDIA PROBE AMP, GENITAL
Chlamydia, DNA Probe: NEGATIVE
GC Probe Amp, Genital: NEGATIVE

## 2011-10-30 MED ORDER — METRONIDAZOLE 0.75 % VA GEL: 1.0000 | Freq: Every day | VAGINAL | Status: AC

## 2011-10-30 NOTE — ED Notes (Signed)
GC/Chlamydia neg., Wet prep: Few clue cells, mod. WBC's. Pt. adequately treated with Flagyl. [name] 10/30/2011

## 2011-11-17 ENCOUNTER — Other Ambulatory Visit: Payer: Self-pay | Admitting: Family Medicine

## 2011-12-03 ENCOUNTER — Other Ambulatory Visit: Payer: Self-pay | Admitting: Family Medicine

## 2011-12-07 ENCOUNTER — Telehealth: Payer: Self-pay | Admitting: Family Medicine

## 2011-12-07 NOTE — Telephone Encounter (Signed)
Refill: Glucose testing strips, qid +prn, qty 350 (7 boxes of 50)  Lancets, qid + prn, qty 400 (4 boxes of 100)  Glucose monitor, qty 1  Lancing device, qty 1  Control solution, qty 1

## 2011-12-07 NOTE — Telephone Encounter (Signed)
Sent via Fax     KP

## 2012-01-04 ENCOUNTER — Other Ambulatory Visit: Payer: Self-pay | Admitting: Family Medicine

## 2012-01-28 ENCOUNTER — Other Ambulatory Visit: Payer: Self-pay | Admitting: Family Medicine

## 2012-02-26 ENCOUNTER — Other Ambulatory Visit: Payer: Self-pay | Admitting: Endocrinology

## 2012-02-26 DIAGNOSIS — E049 Nontoxic goiter, unspecified: Secondary | ICD-10-CM

## 2012-03-05 ENCOUNTER — Ambulatory Visit
Admission: RE | Admit: 2012-03-05 | Discharge: 2012-03-05 | Disposition: A | Discharge status: Home / Self Care | Payer: Medicare HMO | Source: Ambulatory Visit | Attending: Endocrinology | Admitting: Endocrinology

## 2012-03-05 DIAGNOSIS — E049 Nontoxic goiter, unspecified: Secondary | ICD-10-CM

## 2012-03-11 ENCOUNTER — Other Ambulatory Visit: Payer: Self-pay | Admitting: Family Medicine

## 2012-03-12 ENCOUNTER — Other Ambulatory Visit: Payer: Self-pay

## 2012-03-12 MED ORDER — ZOSTER VACCINE LIVE 19400 UNT/0.65ML ~~LOC~~ SOLR: 0.6500 mL | Freq: Once | SUBCUTANEOUS | Status: AC

## 2012-04-12 ENCOUNTER — Other Ambulatory Visit: Payer: Self-pay | Admitting: Family Medicine

## 2012-04-23 ENCOUNTER — Other Ambulatory Visit: Payer: Self-pay | Admitting: Family Medicine

## 2012-06-10 ENCOUNTER — Other Ambulatory Visit: Payer: Self-pay | Admitting: Family Medicine

## 2012-06-24 ENCOUNTER — Other Ambulatory Visit: Payer: Self-pay | Admitting: Family Medicine

## 2012-06-27 ENCOUNTER — Other Ambulatory Visit: Payer: Self-pay | Admitting: Family Medicine

## 2012-06-27 NOTE — Telephone Encounter (Signed)
Called home # listed went straight to voicemail, lm to call & schedule DM MGMT wt/fasting labs 15-minute appt

## 2012-06-27 NOTE — Telephone Encounter (Signed)
Please offer this patient an OV for Diabetes management and fasting labs      KP

## 2012-07-01 NOTE — Telephone Encounter (Signed)
Called pt again today at 1136am finally spoke with patient she is coming in Thursday 1015am fir Dm mgmt 15-minutes & fasting labs

## 2012-07-03 ENCOUNTER — Ambulatory Visit: Payer: Self-pay | Admitting: Family Medicine

## 2012-07-07 ENCOUNTER — Ambulatory Visit (INDEPENDENT_AMBULATORY_CARE_PROVIDER_SITE_OTHER): Payer: Medicare HMO | Admitting: Family Medicine

## 2012-07-07 ENCOUNTER — Encounter: Payer: Self-pay | Admitting: Family Medicine

## 2012-07-07 VITALS — BP 134/70 | HR 66 | Temp 98.5°F | Wt 179.8 lb

## 2012-07-07 DIAGNOSIS — M21619 Bunion of unspecified foot: Secondary | ICD-10-CM

## 2012-07-07 DIAGNOSIS — I10 Essential (primary) hypertension: Secondary | ICD-10-CM

## 2012-07-07 DIAGNOSIS — E119 Type 2 diabetes mellitus without complications: Secondary | ICD-10-CM

## 2012-07-07 DIAGNOSIS — E785 Hyperlipidemia, unspecified: Secondary | ICD-10-CM

## 2012-07-07 MED ORDER — METOPROLOL SUCCINATE ER 50 MG PO TB24: ORAL_TABLET | ORAL | Status: DC

## 2012-07-07 NOTE — Progress Notes (Signed)
  Subjective:    Patient ID: [name], female    DOB: [birth year], 66 y.o.   MRN: [id number]  HPI Pt here for f/u.   HYPERTENSION Disease Monitoring Blood pressure range-not checked Chest pain- no      Dyspnea- no Medications Compliance- good Lightheadedness- no   Edema- no   DIABETES Disease Monitoring Blood Sugar ranges ---143-153 Polyuria- no New Visual problems- no Medications Compliance- good Hypoglycemic symptoms- no   HYPERLIPIDEMIA Disease Monitoring See symptoms for Hypertension Medications Compliance- good RUQ pain- no  Muscle aches- no  ROS See HPI above   PMH Smoking Status noted     Review of Systems As above    Objective:   Physical Exam BP 134/70  Pulse 66  Temp 98.5 F (36.9 C) (Oral)  Wt 179 lb 12.8 oz (81.557 kg)  SpO2 97% General appearance: alert, cooperative, appears stated age and no distress Lungs: clear to auscultation bilaterally Heart: S1, S2 normal Extremities: extremities normal, atraumatic, no cyanosis or edema Sensory exam of the foot is normal, tested with the monofilament. Good pulses, no lesions or ulcers, good peripheral pulses.      Assessment & Plan:

## 2012-07-07 NOTE — Assessment & Plan Note (Signed)
Check labs Con' meds 

## 2012-07-07 NOTE — Assessment & Plan Note (Signed)
Check labs con't meds 

## 2012-07-07 NOTE — Patient Instructions (Signed)

## 2012-07-07 NOTE — Assessment & Plan Note (Signed)
Refer to podiatry

## 2012-07-07 NOTE — Assessment & Plan Note (Signed)
Stable con't meds 

## 2012-07-08 LAB — LIPID PANEL
Cholesterol: 179 mg/dL (ref 0–200)
HDL: 33.2 mg/dL — ABNORMAL LOW (ref >39.00)
VLDL: 40.4 mg/dL — ABNORMAL HIGH (ref 0.0–40.0)

## 2012-07-08 LAB — BASIC METABOLIC PANEL
BUN: 20 mg/dL (ref 6–23)
Creatinine, Ser: 0.9 mg/dL (ref 0.4–1.2)
GFR: 82.55 mL/min (ref >60.00)
Glucose, Bld: 189 mg/dL — ABNORMAL HIGH (ref 70–99)

## 2012-07-08 LAB — LDL CHOLESTEROL, DIRECT: Direct LDL: 99.9 mg/dL

## 2012-07-08 LAB — MICROALBUMIN / CREATININE URINE RATIO
Creatinine,U: 402.1 mg/dL
Microalb Creat Ratio: 4.1 mg/g (ref 0.0–30.0)

## 2012-07-08 LAB — POCT URINALYSIS DIPSTICK
Bilirubin, UA: NEGATIVE
Blood, UA: NEGATIVE
Glucose, UA: NEGATIVE
Nitrite, UA: NEGATIVE
Spec Grav, UA: 1.01
Urobilinogen, UA: 0.2

## 2012-07-08 LAB — HEPATIC FUNCTION PANEL
Albumin: 4.1 g/dL (ref 3.5–5.2)
Total Protein: 7.1 g/dL (ref 6.0–8.3)

## 2012-07-08 LAB — HEMOGLOBIN A1C: Hgb A1c MFr Bld: 8.5 % — ABNORMAL HIGH (ref 4.6–6.5)

## 2012-07-09 MED ORDER — ATORVASTATIN CALCIUM 10 MG PO TABS: 10.0000 mg | ORAL_TABLET | Freq: Every day | ORAL | Status: DC

## 2012-07-14 ENCOUNTER — Other Ambulatory Visit: Payer: Self-pay | Admitting: Family Medicine

## 2012-08-01 ENCOUNTER — Ambulatory Visit: Payer: Self-pay | Admitting: Family Medicine

## 2012-08-08 ENCOUNTER — Encounter: Payer: Self-pay | Admitting: Family Medicine

## 2012-08-08 ENCOUNTER — Ambulatory Visit (INDEPENDENT_AMBULATORY_CARE_PROVIDER_SITE_OTHER): Payer: Medicare HMO | Admitting: Family Medicine

## 2012-08-08 VITALS — BP 132/68 | HR 62 | Temp 98.9°F | Wt 177.6 lb

## 2012-08-08 DIAGNOSIS — Z23 Encounter for immunization: Secondary | ICD-10-CM

## 2012-08-08 DIAGNOSIS — N644 Mastodynia: Secondary | ICD-10-CM

## 2012-08-08 NOTE — Progress Notes (Signed)
  Subjective:    Patient ID: [name], female    DOB: 07/31/1946, 66 y.o.   MRN: [id number]  HPI  Pt here c/o pain in L breast---inner upper quadrant.  No nipple d/c.  She can not palpate anything.  She has not had a mammogram in a long time  Review of Systems As above    Objective:   Physical Exam  Constitutional: She appears well-developed and well-nourished.  Genitourinary: There is breast tenderness. No breast swelling, discharge or bleeding.  Psychiatric: She has a normal mood and affect. Her behavior is normal. Judgment and thought content normal.    Breast exam--- no nipple d/c, no masses palpated, no errythema , not hot to touch. No axillary nodes.      Assessment & Plan:  Breast pin--- diagnostic mammogram ordered

## 2012-08-08 NOTE — Patient Instructions (Addendum)
We will schedule a diagnostic mammogram for you.  [name] will call you in the next few days.  The name of the medication for diabetes you asked me about is Victoza----you wanted to ask Dr Talmage Nap about it.

## 2012-08-12 ENCOUNTER — Other Ambulatory Visit: Payer: Self-pay | Admitting: Family Medicine

## 2012-08-15 ENCOUNTER — Ambulatory Visit
Admission: RE | Admit: 2012-08-15 | Discharge: 2012-08-15 | Disposition: A | Discharge status: Home / Self Care | Payer: Medicare HMO | Source: Ambulatory Visit | Attending: Family Medicine | Admitting: Family Medicine

## 2012-08-15 ENCOUNTER — Other Ambulatory Visit: Payer: Self-pay | Admitting: Family Medicine

## 2012-08-15 DIAGNOSIS — N644 Mastodynia: Secondary | ICD-10-CM

## 2012-08-16 ENCOUNTER — Other Ambulatory Visit: Payer: Self-pay | Admitting: Family Medicine

## 2012-10-12 ENCOUNTER — Other Ambulatory Visit: Payer: Self-pay | Admitting: Family Medicine

## 2012-10-23 ENCOUNTER — Encounter (HOSPITAL_COMMUNITY): Payer: Self-pay | Admitting: Family Medicine

## 2012-10-23 ENCOUNTER — Emergency Department (HOSPITAL_COMMUNITY): Payer: Medicare HMO

## 2012-10-23 ENCOUNTER — Emergency Department (HOSPITAL_COMMUNITY)
Admission: EM | Admit: 2012-10-23 | Discharge: 2012-10-24 | Disposition: A | Discharge status: Home / Self Care | Payer: Medicare HMO | Attending: Emergency Medicine | Admitting: Emergency Medicine

## 2012-10-23 DIAGNOSIS — K2970 Gastritis, unspecified, without bleeding: Secondary | ICD-10-CM

## 2012-10-23 DIAGNOSIS — R109 Unspecified abdominal pain: Secondary | ICD-10-CM

## 2012-10-23 DIAGNOSIS — Z79899 Other long term (current) drug therapy: Secondary | ICD-10-CM | POA: Insufficient documentation

## 2012-10-23 DIAGNOSIS — E119 Type 2 diabetes mellitus without complications: Secondary | ICD-10-CM | POA: Insufficient documentation

## 2012-10-23 DIAGNOSIS — Z8659 Personal history of other mental and behavioral disorders: Secondary | ICD-10-CM | POA: Insufficient documentation

## 2012-10-23 DIAGNOSIS — F17200 Nicotine dependence, unspecified, uncomplicated: Secondary | ICD-10-CM | POA: Insufficient documentation

## 2012-10-23 DIAGNOSIS — Z9861 Coronary angioplasty status: Secondary | ICD-10-CM | POA: Insufficient documentation

## 2012-10-23 DIAGNOSIS — Z8669 Personal history of other diseases of the nervous system and sense organs: Secondary | ICD-10-CM | POA: Insufficient documentation

## 2012-10-23 DIAGNOSIS — Z8719 Personal history of other diseases of the digestive system: Secondary | ICD-10-CM | POA: Insufficient documentation

## 2012-10-23 DIAGNOSIS — R112 Nausea with vomiting, unspecified: Secondary | ICD-10-CM | POA: Insufficient documentation

## 2012-10-23 DIAGNOSIS — E785 Hyperlipidemia, unspecified: Secondary | ICD-10-CM | POA: Insufficient documentation

## 2012-10-23 DIAGNOSIS — I2510 Atherosclerotic heart disease of native coronary artery without angina pectoris: Secondary | ICD-10-CM | POA: Insufficient documentation

## 2012-10-23 DIAGNOSIS — Z794 Long term (current) use of insulin: Secondary | ICD-10-CM | POA: Insufficient documentation

## 2012-10-23 DIAGNOSIS — Z7982 Long term (current) use of aspirin: Secondary | ICD-10-CM | POA: Insufficient documentation

## 2012-10-23 DIAGNOSIS — I10 Essential (primary) hypertension: Secondary | ICD-10-CM | POA: Insufficient documentation

## 2012-10-23 HISTORY — DX: Acute coronary thrombosis not resulting in myocardial infarction: I24.0

## 2012-10-23 HISTORY — DX: Acute pancreatitis without necrosis or infection, unspecified: K85.90

## 2012-10-23 LAB — CBC WITH DIFFERENTIAL/PLATELET
Eosinophils Absolute: 0 10*3/uL (ref 0.0–0.7)
HCT: 35.2 % — ABNORMAL LOW (ref 36.0–46.0)
Hemoglobin: 12.2 g/dL (ref 12.0–15.0)
Lymphs Abs: 2.9 10*3/uL (ref 0.7–4.0)
MCH: 30 pg (ref 26.0–34.0)
Monocytes Absolute: 0.8 10*3/uL (ref 0.1–1.0)
Monocytes Relative: 7 % (ref 3–12)
Neutrophils Relative %: 65 % (ref 43–77)
RBC: 4.07 MIL/uL (ref 3.87–5.11)

## 2012-10-23 LAB — COMPREHENSIVE METABOLIC PANEL
Alkaline Phosphatase: 77 U/L (ref 39–117)
BUN: 19 mg/dL (ref 6–23)
Chloride: 102 mEq/L (ref 96–112)
GFR calc Af Amer: >90 mL/min (ref >90)
Glucose, Bld: 162 mg/dL — ABNORMAL HIGH (ref 70–99)
Potassium: 3.8 mEq/L (ref 3.5–5.1)
Total Bilirubin: 0.7 mg/dL (ref 0.3–1.2)
Total Protein: 7.6 g/dL (ref 6.0–8.3)

## 2012-10-23 LAB — LIPASE, BLOOD: Lipase: 18 U/L (ref 11–59)

## 2012-10-23 MED ORDER — MORPHINE SULFATE 4 MG/ML IJ SOLN
4.0000 mg | Freq: Once | INTRAMUSCULAR | Status: AC
  Administered 2012-10-23: 4 mg via INTRAVENOUS
  Filled 2012-10-23: qty 1

## 2012-10-23 MED ORDER — GI COCKTAIL ~~LOC~~
30.0000 mL | Freq: Once | ORAL | Status: AC
  Administered 2012-10-23: 30 mL via ORAL
  Filled 2012-10-23: qty 30

## 2012-10-23 MED ORDER — SODIUM CHLORIDE 0.9 % IV SOLN
Freq: Once | INTRAVENOUS | Status: AC
  Administered 2012-10-23: 22:00:00 via INTRAVENOUS

## 2012-10-23 MED ORDER — PANTOPRAZOLE SODIUM 40 MG IV SOLR
40.0000 mg | Freq: Once | INTRAVENOUS | Status: AC
  Administered 2012-10-24: 40 mg via INTRAVENOUS
  Filled 2012-10-23: qty 40

## 2012-10-23 MED ORDER — ONDANSETRON HCL 4 MG/2ML IJ SOLN
4.0000 mg | Freq: Once | INTRAMUSCULAR | Status: AC
  Administered 2012-10-23: 4 mg via INTRAVENOUS
  Filled 2012-10-23: qty 2

## 2012-10-23 NOTE — ED Notes (Signed)
[id number]> Expected date:<BR> Expected time:<BR> Means of arrival:<BR> Comments:<BR> TR2

## 2012-10-23 NOTE — ED Notes (Signed)
Patient states that she has had abdominal pain for a few weeks. Today, pain was accompanied by sweating and vomiting. Has had pancreatitis in the past and this pain is similar.

## 2012-10-23 NOTE — ED Provider Notes (Signed)
History     CSN: [id number]  Arrival date & time 10/23/12  2005   First MD Initiated Contact with Patient 10/23/12 2129      Chief Complaint  Patient presents with  . Abdominal Pain   HPI  History provided by the patient. Patient is a 67 year old female with history of hypertension, hyperlipidemia, diabetes, CAD, and pancreatitis who presents with complaints of epigastric abdominal pain. Patient reports having intermittent waxing waning pains for the past 2-3 weeks. Symptoms have seemed to be aggravated around mealtimes but not every time. Patient does report recently being put on the generic medication for Lipitor and felt this may have been a cause of her symptoms. She did stop taking this one week ago with some slight initial improvement of symptoms but they have continued. Symptoms have been worsening and today she reports having the most significant pains this afternoon while working in the kitchen to prepare a meal. Symptoms were accompanied with some nausea, sweats and vomiting episodes today. Pain has improved some with time and rest. She denies any other associated symptoms. Denies any constipation or diarrhea. Denies any fever or chills. Patient has no prior history of abdominal surgery. Denies any alcohol use. Patient states symptoms do feel like previous pancreatitis-type symptoms. She states last time she pancreatitis with several years ago thought to be caused by metformin. She no longer takes metformin.     Past Medical History  Diagnosis Date  . Diabetes mellitus   . Hypertension   . Hyperlipidemia   . Depression   . Restless leg syndrome   . Pancreatitis   . Blockage of coronary artery of heart     Past Surgical History  Procedure Date  . Tubal ligation   . Rotator cuff repair     right  . Coronary angioplasty with stent placement     Family History  Problem Relation Age of Onset  . Lung cancer    . Breast cancer    . Thyroid cancer    . Uterine cancer      . Cancer Other     lung,thyroid,breast,uterine    History  Substance Use Topics  . Smoking status: Current Every Day Smoker -- 0.2 packs/day    Types: Cigarettes  . Smokeless tobacco: Never Used  . Alcohol Use: No    OB History    Grav Para Term Preterm Abortions TAB SAB Ect Mult Living                  Review of Systems  Constitutional: Negative for fever and chills.  Cardiovascular: Negative for chest pain and palpitations.  Gastrointestinal: Positive for nausea, vomiting and abdominal pain. Negative for diarrhea and constipation.  Genitourinary: Negative for dysuria, frequency, hematuria and flank pain.  All other systems reviewed and are negative.    Allergies  Levemir and Metformin and related  Home Medications   Current Outpatient Rx  Name  Route  Sig  Dispense  Refill  . AMLODIPINE BESYLATE 10 MG PO TABS   Oral   Take 10 mg by mouth daily.         . ASPIRIN BUF(CACARB-MGCARB-MGO) 324 MG PO TABS   Oral   Take 324 mg by mouth daily.           . ATORVASTATIN CALCIUM 10 MG PO TABS   Oral   Take 10 mg by mouth daily. Stop taking because it was making her sick, and since she has stopped taking it she has  felt a little better         . BENAZEPRIL HCL 20 MG PO TABS   Oral   Take 20 mg by mouth daily.         [name] HYDROCODONE-ACETAMINOPHEN 5-325 MG PO TABS   Oral   Take 1 tablet by mouth every 4 (four) hours as needed. For pain         . METOPROLOL SUCCINATE ER 50 MG PO TB24   Oral   Take 50 mg by mouth daily. 1 tab by mouth daily-         . ADULT MULTIVITAMIN W/MINERALS CH   Oral   Take 1 tablet by mouth daily.         [name] NOVOLIN N RELION 100 UNIT/ML [place] SUSP   Subcutaneous   Inject 50 Units into the skin at bedtime. 25 units during the day and 25 units at bedtime         . NOVOLIN R RELION 100 UNIT/ML IJ SOLN   Subcutaneous   Inject 30 Units into the skin 3 (three) times daily.         . OMEGA-3-ACID ETHYL ESTERS 1 G PO CAPS    Oral   Take 2 g by mouth daily.         [name] VITAMIN E 100 UNITS PO CAPS   Oral   Take 100 Units by mouth daily.           [name] PEN NEEDLES 5/16" 31G X 8 MM MISC   Does not apply   by Does not apply route daily.           . INSULIN SYRINGE-NEEDLE U-100 31G X 5/16" 0.3 ML MISC   Does not apply   10 Units by Does not apply route 3 (three) times daily before meals.           [name] PRODIGY NO CODING BLOOD GLUC VI STRP      TEST BLOOD SUGAR UP TO 3 TIMES A DAY OR AS DIRECTED.   100 each   1     BP 157/64  Pulse 60  Temp 97.7 F (36.5 C) (Oral)  Resp 20  SpO2 100%  Physical Exam  Nursing note and vitals reviewed. Constitutional: She is oriented to person, place, and time. She appears well-developed and well-nourished. No distress.  HENT:  Head: Normocephalic.  Cardiovascular: Normal rate and regular rhythm.   Pulmonary/Chest: Effort normal and breath sounds normal.  Abdominal: Soft. She exhibits no distension. There is tenderness in the right upper quadrant and epigastric area. There is positive Murphy's sign. There is no rebound and no guarding.  Neurological: She is alert and oriented to person, place, and time.  Skin: Skin is warm and dry.  Psychiatric: She has a normal mood and affect. Her behavior is normal.    ED Course  Procedures   Results for orders placed during the hospital encounter of 10/23/12  CBC WITH DIFFERENTIAL      Component Value Range   WBC 10.8 (*) 4.0 - 10.5 K/uL   RBC 4.07  3.87 - 5.11 MIL/uL   Hemoglobin 12.2  12.0 - 15.0 g/dL   HCT 16.1 (*) 09.6 - 04.5 %   MCV 86.5  78.0 - 100.0 fL   MCH 30.0  26.0 - 34.0 pg   MCHC 34.7  30.0 - 36.0 g/dL   RDW 40.9  81.1 - 91.4 %   Platelets 213  150 - 400 K/uL  Neutrophils Relative 65  43 - 77 %   Neutro Abs 7.0  1.7 - 7.7 K/uL   Lymphocytes Relative 27  12 - 46 %   Lymphs Abs 2.9  0.7 - 4.0 K/uL   Monocytes Relative 7  3 - 12 %   Monocytes Absolute 0.8  0.1 - 1.0 K/uL   Eosinophils Relative 0  0 - 5  %   Eosinophils Absolute 0.0  0.0 - 0.7 K/uL   Basophils Relative 0  0 - 1 %   Basophils Absolute 0.0  0.0 - 0.1 K/uL  COMPREHENSIVE METABOLIC PANEL      Component Value Range   Sodium 137  135 - 145 mEq/L   Potassium 3.8  3.5 - 5.1 mEq/L   Chloride 102  96 - 112 mEq/L   CO2 24  19 - 32 mEq/L   Glucose, Bld 162 (*) 70 - 99 mg/dL   BUN 19  6 - 23 mg/dL   Creatinine, Ser 1.61  0.50 - 1.10 mg/dL   Calcium 09.6  8.4 - 04.5 mg/dL   Total Protein 7.6  6.0 - 8.3 g/dL   Albumin 4.0  3.5 - 5.2 g/dL   AST 25  0 - 37 U/L   ALT 17  0 - 35 U/L   Alkaline Phosphatase 77  39 - 117 U/L   Total Bilirubin 0.7  0.3 - 1.2 mg/dL   GFR calc non Af Amer >90  >90 mL/min   GFR calc Af Amer >90  >90 mL/min  LIPASE, BLOOD      Component Value Range   Lipase 18  11 - 59 U/L      US Abdomen Complete  10/24/2012  *RADIOLOGY REPORT*  Clinical Data:  Abdominal pain.  COMPLETE ABDOMINAL ULTRASOUND  Comparison:  CT abdomen and pelvis 10/14/2010  Findings:  Gallbladder:  The gallbladder is contracted, possibly due to nonfasting state.  Tiny layering echogenic foci demonstrated consistent with tiny stones.  No gallbladder wall thickening or edema.  Common bile duct:  Normal caliber with measured diameter of 5.4 mm.  Liver:  Mild diffusely increased echotexture pattern of the liver suggesting diffuse fatty infiltration.  No focal lesions are appreciated.  IVC:  Appears normal.  Pancreas:  Visualized portions of the head and body of the pancreas are unremarkable.  Spleen:  Spleen length measures 9.7 cm.  Normal parenchymal echotexture.  The  Right Kidney:  The right kidney measures 13.6 cm length.  Mild prominence of the extrarenal pelvis, likely representing normal variation. Circumscribed cystic structure in the upper pole measuring 2.2 x 2 x 1.8 cm consistent with a simple cyst. No evidence of hydronephrosis.  Left Kidney:  The left kidney measures 13.1 cm length. No hydronephrosis.  4 mm shadowing echogenic structure in  the mid pole likely represents a small stone.  Abdominal aorta:  No aneurysm identified.  IMPRESSION: Diffuse fatty infiltration of the liver.  Small cyst in the right kidney.  4 mm nonobstructing stone in the left kidney.  Tiny stones in the dependent portion.  No inflammatory changes.   Original Report Authenticated By: Burman Nieves, M.D.      1. Abdominal pain   2. Gastritis       MDM  9:30 PM patient seen and evaluated. Patient currently sitting and resting complain the bed does not appear in significant discomfort. Patient does seem to have greater tenderness in the right upper quadrant and epigastric area with positive Murphy's. CBC, CMP, lipase pain  medications ordered.   Patient feeling much better after medications. Patient states she especially noticed significant improvements after a GI cocktail. On reexamination she continues to be tender in the right upper quadrant. At this time will order ultrasound to rule out gallstones.   Ultrasound shows. Tiny sludge in the gallbladder. No signs of acute cholecystitis. Small cyst of the kidney and 4 mm nonobstructing stone. Patient continues to feel much better. At this time suspect symptoms were likely related to gastritis and acid reflux. Patient will plan to have followup with GI specialist.      Angus Seller, PA 10/25/12 [phone]

## 2012-10-24 MED ORDER — RANITIDINE HCL 150 MG PO TABS: 150.0000 mg | ORAL_TABLET | Freq: Two times a day (BID) | ORAL | Status: DC

## 2012-10-24 MED ORDER — OMEPRAZOLE 20 MG PO CPDR: 20.0000 mg | DELAYED_RELEASE_CAPSULE | Freq: Every day | ORAL | Status: DC

## 2012-10-25 NOTE — ED Provider Notes (Signed)
Medical screening examination/treatment/procedure(s) were performed by non-physician practitioner and as supervising physician I was immediately available for consultation/collaboration.    Atom Solivan L Travious Vanover, MD 10/25/12 1529 

## 2012-11-17 ENCOUNTER — Other Ambulatory Visit: Payer: Self-pay | Admitting: Family Medicine

## 2012-11-17 DIAGNOSIS — I10 Essential (primary) hypertension: Secondary | ICD-10-CM

## 2012-11-17 NOTE — Telephone Encounter (Signed)
Refill for norvasc sent to pharmacy

## 2012-12-18 ENCOUNTER — Ambulatory Visit (INDEPENDENT_AMBULATORY_CARE_PROVIDER_SITE_OTHER): Payer: Medicare HMO | Admitting: Family Medicine

## 2012-12-18 ENCOUNTER — Encounter: Payer: Self-pay | Admitting: Family Medicine

## 2012-12-18 VITALS — BP 142/80 | HR 63 | Temp 98.6°F | Wt 171.0 lb

## 2012-12-18 DIAGNOSIS — R059 Cough, unspecified: Secondary | ICD-10-CM

## 2012-12-18 DIAGNOSIS — R05 Cough: Secondary | ICD-10-CM

## 2012-12-18 DIAGNOSIS — J019 Acute sinusitis, unspecified: Secondary | ICD-10-CM

## 2012-12-18 MED ORDER — CEFUROXIME AXETIL 500 MG PO TABS: 500.0000 mg | ORAL_TABLET | Freq: Two times a day (BID) | ORAL | Status: AC

## 2012-12-18 MED ORDER — GUAIFENESIN-CODEINE 100-10 MG/5ML PO SYRP: ORAL_SOLUTION | ORAL | Status: DC

## 2012-12-18 NOTE — Progress Notes (Signed)
  Subjective:     [name] is a 67 y.o. female here for evaluation of a cough. Onset of symptoms was several days ago. Symptoms have been gradually worsening since that time. The cough is nonproductive and is aggravated by infection, pollens and reclining position. Associated symptoms include: chills, shortness of breath and sputum production. Patient does not have a history of asthma. Patient does not have a history of environmental allergens. Patient has not traveled recently. Patient does not have a history of smoking. Patient has not had a previous chest x-ray. Patient has not had a PPD done.  The following portions of the patient's history were reviewed and updated as appropriate: allergies, current medications, past family history, past medical history, past social history, past surgical history and problem list.  Review of Systems Pertinent items are noted in HPI.    Objective:    Oxygen saturation 99% on room air BP 142/80  Pulse 63  Temp(Src) 98.6 F (37 C) (Oral)  Wt 171 lb (77.565 kg)  BMI 29.12 kg/m2  SpO2 99% General appearance: alert, cooperative, appears stated age and no distress Ears: normal TM's and external ear canals both ears Nose: green discharge, mild congestion, turbinates red, swollen, right turbinate red, swollen Throat: lips, mucosa, and tongue normal; teeth and gums normal Lungs: clear to auscultation bilaterally Heart: regular rate and rhythm, S1, S2 normal, no murmur, click, rub or gallop    Assessment:    Sinusitis    Plan:    Antibiotics per medication orders. Antitussives per medication orders. Avoid exposure to tobacco smoke and fumes. Call if shortness of breath worsens, blood in sputum, change in character of cough, development of fever or chills, inability to maintain nutrition and hydration. Avoid exposure to tobacco smoke and fumes. Trial of steroid nasal spray.

## 2012-12-18 NOTE — Patient Instructions (Signed)

## 2013-01-06 ENCOUNTER — Ambulatory Visit: Payer: Medicare HMO | Admitting: Family Medicine

## 2013-01-09 ENCOUNTER — Ambulatory Visit: Payer: Medicare HMO | Admitting: Family Medicine

## 2013-01-09 DIAGNOSIS — Z0289 Encounter for other administrative examinations: Secondary | ICD-10-CM

## 2013-01-16 ENCOUNTER — Encounter: Payer: Self-pay | Admitting: Family Medicine

## 2013-01-16 ENCOUNTER — Ambulatory Visit (INDEPENDENT_AMBULATORY_CARE_PROVIDER_SITE_OTHER): Payer: Medicare HMO | Admitting: Family Medicine

## 2013-01-16 VITALS — BP 132/74 | HR 53 | Temp 98.4°F | Wt 173.6 lb

## 2013-01-16 DIAGNOSIS — E042 Nontoxic multinodular goiter: Secondary | ICD-10-CM

## 2013-01-16 DIAGNOSIS — J019 Acute sinusitis, unspecified: Secondary | ICD-10-CM

## 2013-01-16 DIAGNOSIS — E785 Hyperlipidemia, unspecified: Secondary | ICD-10-CM

## 2013-01-16 DIAGNOSIS — E119 Type 2 diabetes mellitus without complications: Secondary | ICD-10-CM

## 2013-01-16 DIAGNOSIS — I10 Essential (primary) hypertension: Secondary | ICD-10-CM

## 2013-01-16 DIAGNOSIS — R809 Proteinuria, unspecified: Secondary | ICD-10-CM

## 2013-01-16 LAB — HEPATIC FUNCTION PANEL
Bilirubin, Direct: 0 mg/dL (ref 0.0–0.3)
Total Bilirubin: 0.3 mg/dL (ref 0.3–1.2)

## 2013-01-16 LAB — LIPID PANEL
HDL: 28.9 mg/dL — ABNORMAL LOW (ref >39.00)
LDL Cholesterol: 103 mg/dL — ABNORMAL HIGH (ref 0–99)
Total CHOL/HDL Ratio: 6
Triglycerides: 172 mg/dL — ABNORMAL HIGH (ref 0.0–149.0)

## 2013-01-16 LAB — POCT URINALYSIS DIPSTICK
Blood, UA: NEGATIVE
Nitrite, UA: NEGATIVE
Urobilinogen, UA: 0.2
pH, UA: 6

## 2013-01-16 LAB — MICROALBUMIN / CREATININE URINE RATIO: Microalb Creat Ratio: 9.8 mg/g (ref 0.0–30.0)

## 2013-01-16 LAB — BASIC METABOLIC PANEL
Calcium: 9 mg/dL (ref 8.4–10.5)
Creatinine, Ser: 0.7 mg/dL (ref 0.4–1.2)

## 2013-01-16 MED ORDER — CEFUROXIME AXETIL 500 MG PO TABS: 500.0000 mg | ORAL_TABLET | Freq: Two times a day (BID) | ORAL | Status: AC

## 2013-01-16 NOTE — Assessment & Plan Note (Signed)
Per endo °

## 2013-01-16 NOTE — Progress Notes (Signed)
  Subjective:     [name] is a 67 y.o. female who presents for evaluation of sinus pain. Symptoms include: congestion, cough, facial pain, headaches, nasal congestion and sinus pressure. Onset of symptoms was 1 week ago. Symptoms have been gradually worsening since that time. Past history is significant for no history of pneumonia or bronchitis. Patient is a non-smoker.  HYPERTENSION Disease Monitoring Blood pressure range-not checking at home Chest pain- no      Dyspnea- no Medications Compliance- good Lightheadedness- no   Edema- no   DIABETES Disease Monitoring Blood Sugar ranges-per endo Polyuria- no New Visual problems- no Medications Compliance- good Hypoglycemic symptoms- no   HYPERLIPIDEMIA Disease Monitoring See symptoms for Hypertension Medications Compliance- good RUQ pain- no  Muscle aches- no  ROS See HPI above   PMH Smoking Status noted    The following portions of the patient's history were reviewed and updated as appropriate: allergies, current medications, past family history, past medical history, past social history, past surgical history and problem list.  Review of Systems Pertinent items are noted in HPI.   Objective:    BP 132/74  Pulse 53  Temp(Src) 98.4 F (36.9 C) (Oral)  Wt 173 lb 9.6 oz (78.744 kg)  BMI 29.56 kg/m2  SpO2 97% General appearance: alert, cooperative, appears stated age and no distress Ears: normal TM's and external ear canals both ears Nose: green discharge, mild congestion, turbinates red, swollen, no sinus tenderness Throat: lips, mucosa, and tongue normal; teeth and gums normal Neck: mild anterior cervical adenopathy, supple, symmetrical, trachea midline and thyroid not enlarged, symmetric, no tenderness/mass/nodules Lungs: clear to auscultation bilaterally Heart: S1, S2 normal Extremities: extremities normal, atraumatic, no cyanosis or edema    Assessment:    Acute bacterial sinusitis.    Plan:    Nasal steroids per medication orders. Antihistamines per medication orders. Ceftin per medication orders.

## 2013-01-16 NOTE — Patient Instructions (Addendum)

## 2013-01-16 NOTE — Assessment & Plan Note (Signed)
Stable con't meds 

## 2013-01-16 NOTE — Assessment & Plan Note (Signed)
Check labs con't meds 

## 2013-01-21 ENCOUNTER — Ambulatory Visit
Admission: RE | Admit: 2013-01-21 | Discharge: 2013-01-21 | Disposition: A | Discharge status: Home / Self Care | Payer: Medicare HMO | Source: Ambulatory Visit | Attending: Family Medicine | Admitting: Family Medicine

## 2013-01-21 DIAGNOSIS — E042 Nontoxic multinodular goiter: Secondary | ICD-10-CM

## 2013-01-22 ENCOUNTER — Telehealth: Payer: Self-pay

## 2013-01-22 DIAGNOSIS — E049 Nontoxic goiter, unspecified: Secondary | ICD-10-CM

## 2013-01-22 NOTE — Telephone Encounter (Signed)
Similar to previous scan but is bigger Refer to Endo for eval . Patient aware and voiced understanding   KP

## 2013-01-29 ENCOUNTER — Ambulatory Visit: Payer: Self-pay | Admitting: Internal Medicine

## 2013-01-29 ENCOUNTER — Telehealth: Payer: Self-pay

## 2013-01-29 DIAGNOSIS — R809 Proteinuria, unspecified: Secondary | ICD-10-CM

## 2013-01-29 MED ORDER — ROSUVASTATIN CALCIUM 20 MG PO TABS: 20.0000 mg | ORAL_TABLET | Freq: Every day | ORAL | Status: DC

## 2013-01-29 NOTE — Telephone Encounter (Signed)
Cholesterol--- LDL goal < 70, HDL >40, TG < 150. Diet and exercise will increase HDL and decrease LDL and TG. Fish, Fish Oil, Flaxseed oil will also help increase the HDL and decrease Triglycerides. Recheck labs in 3. Increase Lipitor to 20 mg #30 2 refills micro albumin elevated ----we need 24 urine for protein  Also fax labs to Dr Talmage Nap. Dx hyperlipidemia , Hep, lipids, bmp      Discussed with patient and she voiced understanding. She was on Lipitor but it was causing Headaches. She would like to go back to Crestor. Per Dr.Low ne. [place] Crestor 20 mg sent it [facility] [address]  Patient will pick up 24 hour urine container today and it has been left at check in      KP

## 2013-02-09 ENCOUNTER — Other Ambulatory Visit: Payer: Self-pay | Admitting: Family Medicine

## 2013-02-09 NOTE — Telephone Encounter (Signed)
Last seen 01/16/13 and filled 12/18/12. Please advise      KP

## 2013-02-10 NOTE — Addendum Note (Signed)
Addended by: Silvio Pate D on: 02/10/2013 04:59 PM   Modules accepted: Orders

## 2013-02-11 ENCOUNTER — Telehealth: Payer: Self-pay

## 2013-02-11 DIAGNOSIS — R778 Other specified abnormalities of plasma proteins: Secondary | ICD-10-CM

## 2013-02-11 NOTE — Telephone Encounter (Signed)
Message copied by Arnette Norris on Wed Feb 11, 2013 11:18 AM ------      Message from: Lelon Perla      Created: Wed Feb 11, 2013  9:13 AM       Elevated protein--- refer nephrology ------

## 2013-02-11 NOTE — Telephone Encounter (Signed)
Discussed with patient and she voiced understanding.      KP 

## 2013-03-03 ENCOUNTER — Ambulatory Visit: Payer: Self-pay | Admitting: Internal Medicine

## 2013-03-13 ENCOUNTER — Ambulatory Visit: Payer: Self-pay | Admitting: Internal Medicine

## 2013-03-20 ENCOUNTER — Ambulatory Visit: Payer: Self-pay | Admitting: Internal Medicine

## 2013-03-31 ENCOUNTER — Other Ambulatory Visit: Payer: Self-pay | Admitting: Family Medicine

## 2013-04-13 ENCOUNTER — Other Ambulatory Visit: Payer: Self-pay | Admitting: Family Medicine

## 2013-04-13 DIAGNOSIS — E119 Type 2 diabetes mellitus without complications: Secondary | ICD-10-CM

## 2013-04-13 MED ORDER — LISINOPRIL 5 MG PO TABS: 5.0000 mg | ORAL_TABLET | Freq: Every day | ORAL | Status: DC

## 2013-04-16 ENCOUNTER — Ambulatory Visit (INDEPENDENT_AMBULATORY_CARE_PROVIDER_SITE_OTHER): Payer: Medicare HMO | Admitting: Internal Medicine

## 2013-04-16 ENCOUNTER — Encounter: Payer: Self-pay | Admitting: Internal Medicine

## 2013-04-16 VITALS — BP 130/64 | HR 61 | Temp 98.5°F | Resp 12 | Ht 65.5 in | Wt 175.0 lb

## 2013-04-16 DIAGNOSIS — E042 Nontoxic multinodular goiter: Secondary | ICD-10-CM | POA: Insufficient documentation

## 2013-04-16 NOTE — Patient Instructions (Addendum)
I think we can continue to watch your thyroid nodules for now.  Please return in 1 year. Try to join MyChart - I will send you the results of your tests through there.

## 2013-04-16 NOTE — Progress Notes (Signed)
Patient ID: [name], female   DOB: Apr 19, 1946, 67 y.o.   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   She was told she had a goiter 5 years ago. She has had left sided FNA by Dr Everardo All first time 2008, then she had a second FNA during a hospitalization in 2010.   Reviewed pathology of her previous FNAs along with the pt: 2010: RIGHT THYROID, FINE NEEDLE ASPIRATION (SMEARS, THIN PREPARATION AND CELL BLOCK): - BENIGN - FINDINGS CONSISTENT WITH NON-NEOPLASTIC GOITER  LEFT THYROID, FINE NEEDLE ASPIRATION (SMEARS, THIN PREPARATION AND CELL BLOCK): RARE CLUSTERS OF BENIGN FOLLICULAR EPITHELIAL CELLS IN A BACKGROUND OF ABUNDANT LYMPHOCYTES.  2009: THYROID, RIGHT, FINE NEEDLE ASPIRATION (THIN PREP AND SMEARS): - FINDINGS CONSISTENT WITH NON-NEOPLASTIC GOITER  THYROID, LEFT, FINE NEEDLE ASPIRATION, (THIN PREP AND SMEAR): - FINDINGS CONSISTENT WITH NON-NEOPLASTIC GOITER  She does not have a h/o thyroid disorders, and thyroid tests have been normal, except 08/2009 - subclinical hyperthyroidism:  Lab Results  Component Value Date   TSH 0.32* 08/18/2009   TSH 0.945  08/03/2009   TSH 1.317  03/09/2009   TSH 0.637  03/03/2009   TSH 0.62 11/20/2007   TSH 0.92 03/11/2007   FREET4 1.08 08/03/2009   FREET4 1.07 03/09/2009   FREET4 0.8 11/20/2007    She had a recent thyroid ultrasound (01/16/2013) that showed goiter and several nodules, c/w prior U/S (03/05/2012). The nodules were a little better defined c/w previous U/S but overall unchanged aspect. On the right, there are 2 larger nodules, of 2.7 x 1.6 x 2.7 cm in the upper right lobe, and 3.3 x 2.4 x 3.2 cm in the lower right lobe, which is part of a large lobular right lobe. On the left side, there is a mid left nodule 2.5 x 1.9 x 1.7 cm and the large left lower nodule of 6 x 3.3 x 3.8 cm. However, the whole thyroid appears diffusely  inhomogeneous.  No dysphagia, odynophagia, hoarseness, lumps in neck, but has a little SOB with lying down.   Pt denies heat intolerance, tremors, anxiety, no more palpitations after cut down on caffeine, insomnia, hyperdefecation, recent weight loss; she also denies significant weight gain, cold intolerance, constipation, dry skin, hair falling, mental fogginess, hypersomnia.   Pt does have a FH of thyroid nodules - in daughter. No FH thyroid cancer. No h/o radiation tx to head or neck.  I reviewed her chart and she also has a history of DM2, HL, HTN, depression, GERD, RLS. She was in the [facility] study.  ROS: Constitutional: + mild weight gain/loss, + fatigue, no subjective hyperthermia/hypothermia Eyes: no blurry vision, no xerophthalmia ENT: no sore throat, no nodules palpated in throat, no dysphagia/odynophagia, no hoarseness Cardiovascular: no CP/SOB/palpitations/leg swelling Respiratory: no cough/SOB Gastrointestinal: no N/V/D/C Musculoskeletal: no muscle/joint aches Skin: no rashes Neurological: no tremors/numbness/tingling/dizziness Psychiatric: no depression/anxiety  Past Medical History  Diagnosis Date  . Diabetes mellitus   . Hypertension   . Hyperlipidemia   . Depression   . Restless leg syndrome   . Pancreatitis   . Blockage of coronary artery of heart    Past Surgical History  Procedure Laterality Date  . Tubal ligation    . Rotator cuff repair      right  . Coronary angioplasty with stent placement     History   Social History  . Marital Status: Married  Spouse Name: N/A    Number of Children: 5   Occupational History  . retired   Social History Main Topics  . Smoking status: Current Every Day Smoker -- 0.25 packs/day    Types: Cigarettes  . Smokeless tobacco: Never Used  . Alcohol Use: No  . Drug Use: No   Social History Narrative   Exercise: seldom   Caffeine use: seldom   Current Outpatient Prescriptions on File Prior to Visit   Medication Sig Dispense Refill  . amLODipine (NORVASC) 10 MG tablet take 1 tablet by mouth once daily  90 tablet  1  . aspirin 324 MG buffered tablet Take 324 mg by mouth daily.        . benazepril (LOTENSIN) 20 MG tablet Take 20 mg by mouth daily.      . Blood Glucose Calibration (TAI DOC CONTROL) NORMAL SOLN       . fexofenadine (ALLEGRA) 180 MG tablet Take 180 mg by mouth daily.      . GUAIFENESIN AC 100-10 MG/5ML syrup take 1 to 2 teaspoonful by mouth at bedtime if needed for cough  120 mL  0  . HYDROcodone-acetaminophen (NORCO/VICODIN) 5-325 MG per tablet Take 1 tablet by mouth every 4 (four) hours as needed. For pain      . Insulin Pen Needle (PEN NEEDLES 31GX5/16") 31G X 8 MM MISC by Does not apply route daily.        . Insulin Syringe-Needle U-100 (BD INSULIN SYRINGE ULTRAFINE) 31G X 5/16" 0.3 ML MISC 10 Units by Does not apply route 3 (three) times daily before meals.        Demetra [employer] (LANCING DEVICE) MISC       . lisinopril (PRINIVIL,ZESTRIL) 5 MG tablet Take 1 tablet (5 mg total) by mouth daily.  30 tablet  2  . metoprolol succinate (TOPROL-XL) 50 MG 24 hr tablet Take 50 mg by mouth daily. 1 tab by mouth daily-      . Multiple Vitamin (MULTIVITAMIN WITH MINERALS) TABS Take 1 tablet by mouth daily.      [name] NOVOLIN N RELION 100 UNIT/ML injection Inject 50 Units into the skin at bedtime. 25 units during the day and 25 units at bedtime      . NOVOLIN R RELION 100 UNIT/ML injection Inject 30 Units into the skin 3 (three) times daily.      [name] omega-3 acid ethyl esters (LOVAZA) 1 G capsule Take 2 g by mouth daily.      [name] omeprazole (PRILOSEC) 20 MG capsule Take 1 capsule (20 mg total) by mouth daily.  60 capsule  0  . Pharmacist Choice Lancets MISC       . PRODIGY NO CODING BLOOD GLUC test strip TEST BLOOD SUGAR UP TO 3 TIMES A DAY OR AS DIRECTED.  100 each  1  . ranitidine (ZANTAC) 150 MG tablet Take 1 tablet (150 mg total) by mouth 2 (two) times daily.  60 tablet  0  . rosuvastatin  (CRESTOR) 20 MG tablet Take 1 tablet (20 mg total) by mouth daily.  30 tablet  2  . vitamin E 100 UNIT capsule Take 100 Units by mouth daily.         No current facility-administered medications on file prior to visit.   Allergies  Allergen Reactions  . Levemir (Insulin Detemir) Hives and Swelling  . Metformin And Related Other (See Comments)    Sick on stomach, sweaty, throw up, pain   Family History  Problem Relation Age of Onset  . Lung cancer    . Breast cancer    . Thyroid cancer    . Uterine cancer    . Cancer Other     lung,thyroid,breast,uterine   PE: BP 130/64  Pulse 61  Temp(Src) 98.5 F (36.9 C) (Oral)  Resp 12  Ht 5' 5.5" (1.664 m)  Wt 175 lb (79.379 kg)  BMI 28.67 kg/m2  SpO2 95% Wt Readings from Last 3 Encounters:  04/16/13 175 lb (79.379 kg)  01/16/13 173 lb 9.6 oz (78.744 kg)  12/18/12 171 lb (77.565 kg)   Constitutional: overweight, in NAD Eyes: PERRLA, EOMI, no exophthalmos ENT: moist mucous membranes, large thyromegaly left>right, no cervical lymphadenopathy Cardiovascular: RRR, No MRG Respiratory: CTA B Gastrointestinal: abdomen soft, NT, ND, BS+ Musculoskeletal: no deformities, strength intact in all 4 Skin: moist, warm, no rashes Neurological: no tremor with outstretched hands, DTR normal in all 4  ASSESSMENT: 1. MNG  PLAN:  1. I reviewed the images of his recent thyroid ultrasound along with the patient. Her nodules are not very well defined, but more than on previous U/S. The overall aspect of the thyroid is unchanged in last year. I discussed with the patient that measurement of the nodule is very much dependent on the evaluating technician. She had previous FNA studies on the largest nodule on the left and right, and they were benign x2 in 2009-2010. Pt does not have a history of radiation Tx to head or neck or family history of thyroid cancer.  - she does not have neck compression symptoms from her thyroid nodules, and since they are not  changed recently, I think we can just continue to follow them for now. We might need to intervene if the nodules become better defined, if they grow, or if they develop worrisome ultrasound features. We did discuss briefly about thyroid cancer, which a very indolent condition. I explained that her cancer risk is probably less than 15%.  - Will check a TSH, free T4, and free T3, since she did not have recent thyroid tests - For now will schedule her to come back in a year, but I advised her to call me if she is to develop any new compression symptoms - I advised her to join my chart for easier communication  Office Visit on 04/16/2013  Component Date Value Range Status  . TSH 04/16/2013 1.29  0.35 - 5.50 uIU/mL Final  . Free T4 04/16/2013 0.84  0.60 - 1.60 ng/dL Final  . T3, Free 16/07/9603 2.7  2.3 - 4.2 pg/mL Final   Normal, will let pt know.

## 2013-04-17 ENCOUNTER — Telehealth: Payer: Self-pay | Admitting: *Deleted

## 2013-04-17 NOTE — Telephone Encounter (Signed)
Called pt and told her that all her thyroid tests came back normal. Pt pleased.

## 2013-05-01 ENCOUNTER — Telehealth: Payer: Self-pay

## 2013-05-01 NOTE — Telephone Encounter (Signed)
Letter from [employer] advising due to diabetes the patient will need to be on an Ace or Arb  To protect her kidneys. Per Dr.Lowne. Lisinopril 5 mg po qd.  Rx has already been faxed and patient voiced understanding.      KP

## 2013-06-14 ENCOUNTER — Other Ambulatory Visit: Payer: Self-pay | Admitting: Family Medicine

## 2013-07-02 ENCOUNTER — Ambulatory Visit (INDEPENDENT_AMBULATORY_CARE_PROVIDER_SITE_OTHER): Payer: Medicare HMO | Admitting: Family Medicine

## 2013-07-02 ENCOUNTER — Other Ambulatory Visit: Payer: Self-pay | Admitting: Family Medicine

## 2013-07-02 ENCOUNTER — Encounter: Payer: Self-pay | Admitting: Family Medicine

## 2013-07-02 VITALS — BP 126/60 | HR 73 | Temp 98.3°F | Wt 178.4 lb

## 2013-07-02 DIAGNOSIS — F17200 Nicotine dependence, unspecified, uncomplicated: Secondary | ICD-10-CM

## 2013-07-02 DIAGNOSIS — IMO0001 Reserved for inherently not codable concepts without codable children: Secondary | ICD-10-CM | POA: Insufficient documentation

## 2013-07-02 DIAGNOSIS — J069 Acute upper respiratory infection, unspecified: Secondary | ICD-10-CM | POA: Insufficient documentation

## 2013-07-02 DIAGNOSIS — Z23 Encounter for immunization: Secondary | ICD-10-CM

## 2013-07-02 MED ORDER — AZELASTINE-FLUTICASONE 137-50 MCG/ACT NA SUSP: 1.0000 | Freq: Two times a day (BID) | NASAL | Status: DC

## 2013-07-02 MED ORDER — NICOTINE 10 MG IN INHA: 1.0000 | RESPIRATORY_TRACT | Status: DC | PRN

## 2013-07-02 NOTE — Assessment & Plan Note (Signed)
Pt given flu vaccine.

## 2013-07-02 NOTE — Patient Instructions (Signed)

## 2013-07-02 NOTE — Progress Notes (Signed)
  Subjective:     [name] is a 67 y.o. female here for evaluation of a cough. Onset of symptoms was 4 days ago. Symptoms have been gradually worsening since that time. The cough is dry and is aggravated by exercise, fumes and reclining position. Associated symptoms include: postnasal drip and shortness of breath. Patient does not have a history of asthma. Patient does have a history of environmental allergens. Patient has not traveled recently. Patient does have a history of smoking. Patient has not had a previous chest x-ray. Patient has not had a PPD done.  The following portions of the patient's history were reviewed and updated as appropriate: allergies, current medications, past family history, past medical history, past social history, past surgical history and problem list.  Review of Systems Pertinent items are noted in HPI.    Objective:    Oxygen saturation 97% on room air BP 126/60  Pulse 73  Temp(Src) 98.3 F (36.8 C) (Oral)  Wt 178 lb 6.4 oz (80.922 kg)  BMI 29.23 kg/m2  SpO2 97% General appearance: alert, cooperative, appears stated age and no distress Ears: normal TM's and external ear canals both ears Nose: turb red and swollen, no sinus tenderness, clear drainage Throat: abnormal findings: mild oropharyngeal erythema and +Pnd Neck: no adenopathy, no carotid bruit, no JVD, supple, symmetrical, trachea midline and thyroid not enlarged, symmetric, no tenderness/mass/nodules Lungs: clear to auscultation bilaterally Heart: S1, S2 normal    Assessment:    URI with Post Nasal Drip    Plan:    Explained lack of efficacy of antibiotics in viral disease. Antitussives per medication orders. Avoid exposure to tobacco smoke and fumes. Call if shortness of breath worsens, blood in sputum, change in character of cough, development of fever or chills, inability to maintain nutrition and hydration. Avoid exposure to tobacco smoke and fumes. Trial of  antihistamines. Trial of steroid nasal spray.

## 2013-07-02 NOTE — Assessment & Plan Note (Signed)
con't anithistamine dymista sample

## 2013-07-02 NOTE — Assessment & Plan Note (Signed)
Nicotrol inhaler sample given to pt See HO

## 2013-07-03 ENCOUNTER — Encounter (HOSPITAL_COMMUNITY): Payer: Self-pay | Admitting: *Deleted

## 2013-07-03 ENCOUNTER — Emergency Department (HOSPITAL_COMMUNITY)
Admission: EM | Admit: 2013-07-03 | Discharge: 2013-07-03 | Disposition: A | Discharge status: Home / Self Care | Payer: Medicare HMO | Source: Home / Self Care | Attending: Family Medicine | Admitting: Family Medicine

## 2013-07-03 DIAGNOSIS — R059 Cough, unspecified: Secondary | ICD-10-CM

## 2013-07-03 DIAGNOSIS — R05 Cough: Secondary | ICD-10-CM

## 2013-07-03 MED ORDER — IPRATROPIUM BROMIDE 0.03 % NA SOLN: 2.0000 | Freq: Two times a day (BID) | NASAL | Status: DC

## 2013-07-03 MED ORDER — GUAIFENESIN-CODEINE 100-10 MG/5ML PO SOLN: 5.0000 mL | Freq: Three times a day (TID) | ORAL | Status: DC | PRN

## 2013-07-03 NOTE — ED Notes (Signed)
Pt reports having "severe cough" since Sunday.  Pt denies coughing up any phlegm or having a fever.

## 2013-07-03 NOTE — ED Provider Notes (Signed)
[name] is a 67 y.o. female who presents to Urgent Care today for cough and nasal congestion started Sunday. Patient denies any productive cough. She denies any wheezing shortness of breath nausea vomiting or diarrhea. She has tried Allegra nasal sprays and cough drops which have only worked a little. She feels well otherwise   Past Medical History  Diagnosis Date  . Diabetes mellitus   . Hypertension   . Hyperlipidemia   . Depression   . Restless leg syndrome   . Pancreatitis   . Blockage of coronary artery of heart    History  Substance Use Topics  . Smoking status: Former Smoker -- 0.00 packs/day  . Smokeless tobacco: Never Used  . Alcohol Use: No   ROS as above Medications reviewed. No current facility-administered medications for this encounter.   Current Outpatient Prescriptions  Medication Sig Dispense Refill  . amLODipine (NORVASC) 10 MG tablet take 1 tablet by mouth once daily  90 tablet  1  . aspirin 324 MG buffered tablet Take 324 mg by mouth daily.        . Azelastine-Fluticasone (DYMISTA) 137-50 MCG/ACT SUSP Place 1 spray into the nose 2 (two) times daily.      . benazepril (LOTENSIN) 20 MG tablet Take 20 mg by mouth daily.      . Blood Glucose Calibration (TAI DOC CONTROL) NORMAL SOLN       . fexofenadine (ALLEGRA) 180 MG tablet Take 180 mg by mouth daily.      . GUAIFENESIN AC 100-10 MG/5ML syrup take 1 to 2 teaspoonful by mouth at bedtime if needed for cough  120 mL  0  . guaiFENesin-codeine 100-10 MG/5ML syrup Take 5 mLs by mouth 3 (three) times daily as needed for cough.  120 mL  0  . Insulin Pen Needle (PEN NEEDLES 31GX5/16") 31G X 8 MM MISC by Does not apply route daily.        . Insulin Syringe-Needle U-100 (BD INSULIN SYRINGE ULTRAFINE) 31G X 5/16" 0.3 ML MISC 10 Units by Does not apply route 3 (three) times daily before meals.        [name] ipratropium (ATROVENT) 0.03 % nasal spray Place 2 sprays into the nose every 12 (twelve) hours.  30 mL  1  .  Lancet Devices (LANCING DEVICE) MISC       . lisinopril (PRINIVIL,ZESTRIL) 5 MG tablet Take 1 tablet (5 mg total) by mouth daily.  30 tablet  2  . LOVAZA 1 G capsule take 2 capsules by mouth twice a day  120 capsule  1  . metoprolol succinate (TOPROL-XL) 50 MG 24 hr tablet Take 50 mg by mouth daily. 1 tab by mouth daily-      . Multiple Vitamin (MULTIVITAMIN WITH MINERALS) TABS Take 1 tablet by mouth daily.      . nicotine (NICOTROL) 10 MG inhaler Inhale 1 puff into the lungs as needed for smoking cessation.  42 each  0  . NOVOLIN N RELION 100 UNIT/ML injection Inject 50 Units into the skin at bedtime. 25 units during the day and 25 units at bedtime      . NOVOLIN R RELION 100 UNIT/ML injection Inject 30 Units into the skin 3 (three) times daily.      [name] omega-3 acid ethyl esters (LOVAZA) 1 G capsule Take 2 g by mouth daily.      [name] omeprazole (PRILOSEC) 20 MG capsule Take 1 capsule (20 mg total) by mouth daily.  60 capsule  0  . Pharmacist Choice Lancets MISC       . PRODIGY NO CODING BLOOD GLUC test strip TEST BLOOD SUGAR UP TO 3 TIMES A DAY OR AS DIRECTED.  100 each  1  . ranitidine (ZANTAC) 150 MG tablet Take 1 tablet (150 mg total) by mouth 2 (two) times daily.  60 tablet  0  . rosuvastatin (CRESTOR) 20 MG tablet Take 1 tablet (20 mg total) by mouth daily.  30 tablet  2  . vitamin E 100 UNIT capsule Take 100 Units by mouth daily.          Exam:  BP 147/64  Pulse 63  Temp(Src) 98.2 F (36.8 C) (Oral)  SpO2 99% Gen: Well NAD, coughing occasionally HEENT: EOMI,  MMM, tympanic membranes are normal appearing bilaterally. Posterior pharynx is mildly erythematous. Lungs: CTABL Nl WOB Heart: RRR no MRG Abd: NABS, NT, ND Exts: Non edematous BL  LE, warm and well perfused.   No results found for this or any previous visit (from the past 24 hour(s)). No results found.  Assessment and Plan: 67 y.o. female with viral URI with cough.  Plan for symptomatically management with Atrovent nasal  spray and cough medication as well as over-the-counter medication. Followup as needed Discussed warning signs or symptoms. Please see discharge instructions. Patient expresses understanding.      Rodolph Bong, MD 07/03/13 [phone]

## 2013-07-08 LAB — HM DIABETES EYE EXAM

## 2013-07-24 ENCOUNTER — Other Ambulatory Visit: Payer: Self-pay | Admitting: Family Medicine

## 2013-08-22 ENCOUNTER — Other Ambulatory Visit: Payer: Self-pay | Admitting: Family Medicine

## 2013-08-28 ENCOUNTER — Other Ambulatory Visit: Payer: Self-pay | Admitting: Family Medicine

## 2013-09-14 ENCOUNTER — Other Ambulatory Visit: Payer: Self-pay | Admitting: Family Medicine

## 2013-09-20 ENCOUNTER — Other Ambulatory Visit: Payer: Self-pay | Admitting: Family Medicine

## 2013-10-21 ENCOUNTER — Other Ambulatory Visit: Payer: Self-pay | Admitting: Family Medicine

## 2013-10-27 ENCOUNTER — Encounter (HOSPITAL_COMMUNITY): Payer: Self-pay | Admitting: Emergency Medicine

## 2013-10-27 ENCOUNTER — Emergency Department (HOSPITAL_COMMUNITY)
Admission: EM | Admit: 2013-10-27 | Discharge: 2013-10-27 | Disposition: A | Discharge status: Home / Self Care | Payer: Medicare HMO | Attending: Emergency Medicine | Admitting: Emergency Medicine

## 2013-10-27 DIAGNOSIS — Z8659 Personal history of other mental and behavioral disorders: Secondary | ICD-10-CM | POA: Insufficient documentation

## 2013-10-27 DIAGNOSIS — Z79899 Other long term (current) drug therapy: Secondary | ICD-10-CM | POA: Insufficient documentation

## 2013-10-27 DIAGNOSIS — E119 Type 2 diabetes mellitus without complications: Secondary | ICD-10-CM | POA: Insufficient documentation

## 2013-10-27 DIAGNOSIS — Z8669 Personal history of other diseases of the nervous system and sense organs: Secondary | ICD-10-CM | POA: Insufficient documentation

## 2013-10-27 DIAGNOSIS — I252 Old myocardial infarction: Secondary | ICD-10-CM | POA: Insufficient documentation

## 2013-10-27 DIAGNOSIS — I10 Essential (primary) hypertension: Secondary | ICD-10-CM | POA: Insufficient documentation

## 2013-10-27 DIAGNOSIS — Z87891 Personal history of nicotine dependence: Secondary | ICD-10-CM | POA: Insufficient documentation

## 2013-10-27 DIAGNOSIS — I2510 Atherosclerotic heart disease of native coronary artery without angina pectoris: Secondary | ICD-10-CM | POA: Insufficient documentation

## 2013-10-27 DIAGNOSIS — Z8719 Personal history of other diseases of the digestive system: Secondary | ICD-10-CM | POA: Insufficient documentation

## 2013-10-27 DIAGNOSIS — M79609 Pain in unspecified limb: Secondary | ICD-10-CM | POA: Insufficient documentation

## 2013-10-27 DIAGNOSIS — M545 Low back pain, unspecified: Secondary | ICD-10-CM | POA: Insufficient documentation

## 2013-10-27 DIAGNOSIS — Z9861 Coronary angioplasty status: Secondary | ICD-10-CM | POA: Insufficient documentation

## 2013-10-27 DIAGNOSIS — IMO0002 Reserved for concepts with insufficient information to code with codable children: Secondary | ICD-10-CM | POA: Insufficient documentation

## 2013-10-27 DIAGNOSIS — Z794 Long term (current) use of insulin: Secondary | ICD-10-CM | POA: Insufficient documentation

## 2013-10-27 DIAGNOSIS — M79602 Pain in left arm: Secondary | ICD-10-CM

## 2013-10-27 DIAGNOSIS — Z7982 Long term (current) use of aspirin: Secondary | ICD-10-CM | POA: Insufficient documentation

## 2013-10-27 LAB — CBC WITH DIFFERENTIAL/PLATELET
Basophils Absolute: 0 10*3/uL (ref 0.0–0.1)
Basophils Relative: 0 % (ref 0–1)
EOS PCT: 2 % (ref 0–5)
Eosinophils Absolute: 0.2 10*3/uL (ref 0.0–0.7)
HCT: 35.6 % — ABNORMAL LOW (ref 36.0–46.0)
Hemoglobin: 12.2 g/dL (ref 12.0–15.0)
LYMPHS ABS: 4.3 10*3/uL — AB (ref 0.7–4.0)
LYMPHS PCT: 54 % — AB (ref 12–46)
MCH: 29.6 pg (ref 26.0–34.0)
MCHC: 34.3 g/dL (ref 30.0–36.0)
MCV: 86.4 fL (ref 78.0–100.0)
MONO ABS: 0.5 10*3/uL (ref 0.1–1.0)
Monocytes Relative: 6 % (ref 3–12)
Neutro Abs: 3 10*3/uL (ref 1.7–7.7)
Neutrophils Relative %: 38 % — ABNORMAL LOW (ref 43–77)
Platelets: 177 10*3/uL (ref 150–400)
RBC: 4.12 MIL/uL (ref 3.87–5.11)
RDW: 13.3 % (ref 11.5–15.5)
WBC: 8 10*3/uL (ref 4.0–10.5)

## 2013-10-27 LAB — TROPONIN I: Troponin I: <0.3 ng/mL (ref <0.30)

## 2013-10-27 LAB — BASIC METABOLIC PANEL
BUN: 17 mg/dL (ref 6–23)
CALCIUM: 9.4 mg/dL (ref 8.4–10.5)
CO2: 23 meq/L (ref 19–32)
CREATININE: 0.65 mg/dL (ref 0.50–1.10)
Chloride: 102 mEq/L (ref 96–112)
GFR calc Af Amer: >90 mL/min (ref >90)
GFR calc non Af Amer: 90 mL/min — ABNORMAL LOW (ref >90)
GLUCOSE: 213 mg/dL — AB (ref 70–99)
Potassium: 3.9 mEq/L (ref 3.7–5.3)
Sodium: 140 mEq/L (ref 137–147)

## 2013-10-27 NOTE — ED Provider Notes (Signed)
CSN: [id number]     Arrival date & time 10/27/13  0304 History   First MD Initiated Contact with Patient 10/27/13 [phone]     Chief Complaint  Patient presents with  . Arm Pain  . Back Pain   (Consider location/radiation/quality/duration/timing/severity/associated sxs/prior Treatment) HPI Comments: Pt with hx of HTN, DM, CAD s/p stents comes in with cc of arm pain - left sided. Pt states that she started having thumb pain 2 days ago - which today, has started extending up to the arm. Pais is dull in the hand area and sharp in the arm area. Pain is constant, not worse with any exertion, or deep respiration. She denies any trauma.   Patient is a 68 y.o. female presenting with arm pain and back pain. The history is provided by the patient.  Arm Pain Pertinent negatives include no chest pain, no abdominal pain, no headaches and no shortness of breath.  Back Pain Associated symptoms: no abdominal pain, no chest pain, no dysuria and no headaches     Past Medical History  Diagnosis Date  . Diabetes mellitus   . Hypertension   . Hyperlipidemia   . Depression   . Restless leg syndrome   . Pancreatitis   . Blockage of coronary artery of heart    Past Surgical History  Procedure Laterality Date  . Tubal ligation    . Rotator cuff repair      right  . Coronary angioplasty with stent placement     Family History  Problem Relation Age of Onset  . Lung cancer    . Breast cancer    . Thyroid cancer    . Uterine cancer    . Cancer Other     lung,thyroid,breast,uterine   History  Substance Use Topics  . Smoking status: Former Smoker -- 0.00 packs/day  . Smokeless tobacco: Never Used  . Alcohol Use: No   OB History   Grav Para Term Preterm Abortions TAB SAB Ect Mult Living                 Review of Systems  Constitutional: Negative for activity change.  Respiratory: Negative for shortness of breath.   Cardiovascular: Negative for chest pain.  Gastrointestinal: Negative for  nausea, vomiting and abdominal pain.  Genitourinary: Negative for dysuria.  Musculoskeletal: Positive for arthralgias and myalgias. Negative for back pain and neck pain.  Neurological: Negative for headaches.    Allergies  Levemir and Metformin and related  Home Medications   Current Outpatient Rx  Name  Route  Sig  Dispense  Refill  . amLODipine (NORVASC) 10 MG tablet      take 1 tablet by mouth once daily   90 tablet   1   . aspirin 324 MG buffered tablet   Oral   Take 324 mg by mouth daily.           . Azelastine-Fluticasone (DYMISTA) 137-50 MCG/ACT SUSP   Nasal   Place 1 spray into the nose 2 (two) times daily.         . benazepril (LOTENSIN) 20 MG tablet   Oral   Take 20 mg by mouth daily.         . Blood Glucose Calibration (TAI DOC CONTROL) NORMAL SOLN               . CRESTOR 20 MG tablet      take 1 tablet by mouth once daily **LABS ARE DUE NOW**   15 tablet  0   . fexofenadine (ALLEGRA) 180 MG tablet   Oral   Take 180 mg by mouth daily.         . GUAIFENESIN AC 100-10 MG/5ML syrup      take 1 to 2 teaspoonful by mouth at bedtime if needed for cough   120 mL   0   . guaiFENesin-codeine 100-10 MG/5ML syrup   Oral   Take 5 mLs by mouth 3 (three) times daily as needed for cough.   120 mL   0   . Insulin Pen Needle (PEN NEEDLES 31GX5/16") 31G X 8 MM MISC   Does not apply   by Does not apply route daily.           . Insulin Syringe-Needle U-100 (BD INSULIN SYRINGE ULTRAFINE) 31G X 5/16" 0.3 ML MISC   Does not apply   10 Units by Does not apply route 3 (three) times daily before meals.           [name] ipratropium (ATROVENT) 0.03 % nasal spray   Nasal   Place 2 sprays into the nose every 12 (twelve) hours.   30 mL   1   . Lancet Devices (LANCING DEVICE) MISC               . lisinopril (PRINIVIL,ZESTRIL) 5 MG tablet      take 1 tablet by mouth once daily   30 tablet   2   . metoprolol succinate (TOPROL-XL) 50 MG 24 hr  tablet      take 1 tablet by mouth once daily   90 tablet   1     Refill Approved   . Multiple Vitamin (MULTIVITAMIN WITH MINERALS) TABS   Oral   Take 1 tablet by mouth daily.         . nicotine (NICOTROL) 10 MG inhaler   Inhalation   Inhale 1 puff into the lungs as needed for smoking cessation.   42 each   0   . NOVOLIN N RELION 100 UNIT/ML injection   Subcutaneous   Inject 50 Units into the skin at bedtime. 25 units during the day and 25 units at bedtime         . NOVOLIN R RELION 100 UNIT/ML injection   Subcutaneous   Inject 30 Units into the skin 3 (three) times daily.         [name] omega-3 acid ethyl esters (LOVAZA) 1 G capsule      take 2 capsules twice a day   120 capsule   1   . omeprazole (PRILOSEC) 20 MG capsule   Oral   Take 1 capsule (20 mg total) by mouth daily.   60 capsule   0   . Pharmacist Choice Lancets MISC               . PRODIGY NO CODING BLOOD GLUC test strip      TEST BLOOD SUGAR UP TO 3 TIMES A DAY OR AS DIRECTED.   100 each   1   . ranitidine (ZANTAC) 150 MG tablet   Oral   Take 1 tablet (150 mg total) by mouth 2 (two) times daily.   60 tablet   0   . vitamin E 100 UNIT capsule   Oral   Take 100 Units by mouth daily.            BP 134/65  Pulse 64  Temp(Src) 98.7 F (37.1 C) (Oral)  Resp 18  Ht  _0  (1.651 m)  Wt 172 lb (78.019 kg)  BMI 28.62 kg/m2  SpO2 97% Physical Exam  Nursing note and vitals reviewed. Constitutional: She is oriented to person, place, and time. She appears well-developed and well-nourished.  HENT:  Head: Normocephalic and atraumatic.  Eyes: EOM are normal. Pupils are equal, round, and reactive to light.  Neck: Neck supple.  Cardiovascular: Normal rate, regular rhythm and normal heart sounds.   No murmur heard. Pulmonary/Chest: Effort normal. No respiratory distress.  Abdominal: Soft. She exhibits no distension. There is no tenderness. There is no rebound and no guarding.   Musculoskeletal:  Non reproducible arm pain, shoulder pain. ROM over the elbow and shoulder is normal and there is no swelling Hand exam and thumb exam are normal with ROM as well.     Neurological: She is alert and oriented to person, place, and time.  Skin: Skin is warm and dry.    ED Course  Procedures (including critical care time) Labs Review Labs Reviewed  CBC WITH DIFFERENTIAL  BASIC METABOLIC PANEL  TROPONIN I   Imaging Review No results found.  EKG Interpretation   None       MDM  No diagnosis found.   Date: 10/27/2013  Rate: 58  Rhythm: normal sinus rhythm  QRS Axis: left  Intervals: normal  ST/T Wave abnormalities: nonspecific ST/T changes  Conduction Disutrbances:right bundle branch block and left anterior fascicular block  Narrative Interpretation:   Old EKG Reviewed: unchanged  Pt comes in with arm pain, thumb pain - left sided. Sx started with thumb pain, and gradually gotten proximal. Pain is not burning type - typical of neuropathy - and the neck and shoulder exam are normal. Also, the M-S exam of the LUE is normal. No evidence of infection or DVT.  Pt has no chest pain, and no dib. Pain is very atypical, but we will screen for ACS. No indication for any imaging.     Varney Biles, MD 10/27/13 [phone]

## 2013-10-27 NOTE — Discharge Instructions (Signed)
We saw you in the ER for the arm pain. All the results in the ER are normal. EKG and troponin are normal. We are not sure what is causing your symptoms. The workup in the ER is not complete, and is limited to screening for life threatening and emergent conditions only, so please see a primary care doctor for further evaluation.  RETURN TO THE ER IF THE SYMPTOMS ARE GETTING WORSE.  Chest Pain (Nonspecific) It is often hard to give a specific diagnosis for the cause of chest pain. There is always a chance that your pain could be related to something serious, such as a heart attack or a blood clot in the lungs. You need to follow up with your caregiver for further evaluation. CAUSES   Heartburn.  Pneumonia or bronchitis.  Anxiety or stress.  Inflammation around your heart (pericarditis) or lung (pleuritis or pleurisy).  A blood clot in the lung.  A collapsed lung (pneumothorax). It can develop suddenly on its own (spontaneous pneumothorax) or from injury (trauma) to the chest.  Shingles infection (herpes zoster virus). The chest wall is composed of bones, muscles, and cartilage. Any of these can be the source of the pain.  The bones can be bruised by injury.  The muscles or cartilage can be strained by coughing or overwork.  The cartilage can be affected by inflammation and become sore (costochondritis). DIAGNOSIS  Lab tests or other studies, such as X-rays, electrocardiography, stress testing, or cardiac imaging, may be needed to find the cause of your pain.  TREATMENT   Treatment depends on what may be causing your chest pain. Treatment may include:  Acid blockers for heartburn.  Anti-inflammatory medicine.  Pain medicine for inflammatory conditions.  Antibiotics if an infection is present.  You may be advised to change lifestyle habits. This includes stopping smoking and avoiding alcohol, caffeine, and chocolate.  You may be advised to keep your head raised (elevated)  when sleeping. This reduces the chance of acid going backward from your stomach into your esophagus.  Most of the time, nonspecific chest pain will improve within 2 to 3 days with rest and mild pain medicine. HOME CARE INSTRUCTIONS   If antibiotics were prescribed, take your antibiotics as directed. Finish them even if you start to feel better.  For the next few days, avoid physical activities that bring on chest pain. Continue physical activities as directed.  Do not smoke.  Avoid drinking alcohol.  Only take over-the-counter or prescription medicine for pain, discomfort, or fever as directed by your caregiver.  Follow your caregiver's suggestions for further testing if your chest pain does not go away.  Keep any follow-up appointments you made. If you do not go to an appointment, you could develop lasting (chronic) problems with pain. If there is any problem keeping an appointment, you must call to reschedule. SEEK MEDICAL CARE IF:   You think you are having problems from the medicine you are taking. Read your medicine instructions carefully.  Your chest pain does not go away, even after treatment.  You develop a rash with blisters on your chest. SEEK IMMEDIATE MEDICAL CARE IF:   You have increased chest pain or pain that spreads to your arm, neck, jaw, back, or abdomen.  You develop shortness of breath, an increasing cough, or you are coughing up blood.  You have severe back or abdominal pain, feel nauseous, or vomit.  You develop severe weakness, fainting, or chills.  You have a fever. THIS IS AN  EMERGENCY. Do not wait to see if the pain will go away. Get medical help at once. Call your local emergency services (911 in [place] Do not drive yourself to the hospital. MAKE SURE YOU:   Understand these instructions.  Will watch your condition.  Will get help right away if you are not doing well or get worse. Document Released: 07/04/2005 Document Revised: 12/17/2011  Document Reviewed: 04/29/2008 [facility] Patient Information 2014 [place], [place].

## 2013-10-27 NOTE — ED Notes (Signed)
Pt from home c/o left hand, left arm and pain at her shoulder blade on the left starting 2 days with pain moving to back today.

## 2013-11-13 ENCOUNTER — Ambulatory Visit: Payer: Medicare HMO | Admitting: Family Medicine

## 2013-11-13 ENCOUNTER — Telehealth: Payer: Self-pay | Admitting: Family Medicine

## 2013-11-13 MED ORDER — ROSUVASTATIN CALCIUM 20 MG PO TABS: ORAL_TABLET | ORAL | Status: DC

## 2013-11-13 NOTE — Telephone Encounter (Signed)
Patient had appt scheduled for today but had to cancel due to family emergency. She would like to know if we can send a small supply of [facility] to [facility] [address]

## 2013-11-13 NOTE — Telephone Encounter (Signed)
Rx sent      KP 

## 2013-11-13 NOTE — Telephone Encounter (Signed)
Send 30 days 

## 2013-11-13 NOTE — Telephone Encounter (Signed)
Please advise      KP 

## 2013-11-15 ENCOUNTER — Other Ambulatory Visit: Payer: Self-pay | Admitting: Family Medicine

## 2013-11-17 ENCOUNTER — Ambulatory Visit (INDEPENDENT_AMBULATORY_CARE_PROVIDER_SITE_OTHER): Payer: Medicare HMO | Admitting: Family Medicine

## 2013-11-17 ENCOUNTER — Telehealth: Payer: Self-pay

## 2013-11-17 ENCOUNTER — Encounter: Payer: Self-pay | Admitting: Family Medicine

## 2013-11-17 VITALS — BP 145/53 | HR 67 | Temp 98.5°F | Wt 174.0 lb

## 2013-11-17 DIAGNOSIS — E118 Type 2 diabetes mellitus with unspecified complications: Secondary | ICD-10-CM

## 2013-11-17 DIAGNOSIS — R21 Rash and other nonspecific skin eruption: Secondary | ICD-10-CM

## 2013-11-17 DIAGNOSIS — E2839 Other primary ovarian failure: Secondary | ICD-10-CM

## 2013-11-17 DIAGNOSIS — IMO0002 Reserved for concepts with insufficient information to code with codable children: Secondary | ICD-10-CM

## 2013-11-17 DIAGNOSIS — E1165 Type 2 diabetes mellitus with hyperglycemia: Secondary | ICD-10-CM

## 2013-11-17 DIAGNOSIS — I10 Essential (primary) hypertension: Secondary | ICD-10-CM

## 2013-11-17 DIAGNOSIS — Z1239 Encounter for other screening for malignant neoplasm of breast: Secondary | ICD-10-CM

## 2013-11-17 DIAGNOSIS — Z23 Encounter for immunization: Secondary | ICD-10-CM

## 2013-11-17 DIAGNOSIS — E785 Hyperlipidemia, unspecified: Secondary | ICD-10-CM

## 2013-11-17 LAB — BASIC METABOLIC PANEL
BUN: 15 mg/dL (ref 6–23)
CO2: 27 mEq/L (ref 19–32)
Calcium: 9.8 mg/dL (ref 8.4–10.5)
Chloride: 103 mEq/L (ref 96–112)
Creatinine, Ser: 0.9 mg/dL (ref 0.4–1.2)
GFR: 84.43 mL/min (ref >60.00)
GLUCOSE: 290 mg/dL — AB (ref 70–99)
POTASSIUM: 3.6 meq/L (ref 3.5–5.1)
Sodium: 138 mEq/L (ref 135–145)

## 2013-11-17 LAB — LIPID PANEL
CHOLESTEROL: 113 mg/dL (ref 0–200)
HDL: 33.4 mg/dL — ABNORMAL LOW (ref >39.00)
Total CHOL/HDL Ratio: 3
Triglycerides: 278 mg/dL — ABNORMAL HIGH (ref 0.0–149.0)
VLDL: 55.6 mg/dL — ABNORMAL HIGH (ref 0.0–40.0)

## 2013-11-17 LAB — HEPATIC FUNCTION PANEL
ALBUMIN: 4.1 g/dL (ref 3.5–5.2)
ALT: 26 U/L (ref 0–35)
AST: 19 U/L (ref 0–37)
Alkaline Phosphatase: 69 U/L (ref 39–117)
Bilirubin, Direct: 0 mg/dL (ref 0.0–0.3)
Total Bilirubin: 0.7 mg/dL (ref 0.3–1.2)
Total Protein: 7.2 g/dL (ref 6.0–8.3)

## 2013-11-17 LAB — LDL CHOLESTEROL, DIRECT: Direct LDL: 57.2 mg/dL

## 2013-11-17 MED ORDER — METOPROLOL SUCCINATE ER 50 MG PO TB24: ORAL_TABLET | ORAL | Status: DC

## 2013-11-17 MED ORDER — LISINOPRIL 5 MG PO TABS: ORAL_TABLET | ORAL | Status: DC

## 2013-11-17 MED ORDER — ROSUVASTATIN CALCIUM 20 MG PO TABS: ORAL_TABLET | ORAL | Status: DC

## 2013-11-17 MED ORDER — CLOTRIMAZOLE-BETAMETHASONE 1-0.05 % EX CREA: 1.0000 "application " | TOPICAL_CREAM | Freq: Two times a day (BID) | CUTANEOUS | Status: DC

## 2013-11-17 MED ORDER — LISINOPRIL 10 MG PO TABS: 10.0000 mg | ORAL_TABLET | Freq: Every day | ORAL | Status: DC

## 2013-11-17 NOTE — Telephone Encounter (Signed)
Pharmacy has agreed to remove the 5 mg of Lisinopril for the patient's profile.      KP

## 2013-11-17 NOTE — Progress Notes (Signed)
Pre visit review using our clinic review tool, if applicable. No additional management support is needed unless otherwise documented below in the visit note. 

## 2013-11-17 NOTE — Patient Instructions (Signed)

## 2013-11-17 NOTE — Progress Notes (Signed)
Patient ID: [name], female   DOB: 03/21/1946, 68 y.o.   MRN: [id number]   Subjective:    Patient ID: Emily ManifoldShirley C Nicholson, female    DOB: 04/29/1946, 68 y.o.   MRN: [id number] HPI Pt here to f/u htn, cholesterol.  She sees endo for her diabetes.  No complaints / problems with med.   Pt also c/o rash on low leg that itches.         Objective:    BP 145/53  Pulse 67  Temp(Src) 98.5 F (36.9 C) (Oral)  Wt 174 lb (78.926 kg)  SpO2 95% General appearance: alert, cooperative, appears stated age and no distress Neck: no adenopathy, no carotid bruit and thyroid not enlarged, symmetric, no tenderness/mass/nodules Lungs: clear to auscultation bilaterally Heart: S1, S2 normal Extremities: extremities normal, atraumatic, no cyanosis or edema Skin: circular lesion with hard scaley nodules --- 1 in diameter R shin       Assessment & Plan:  1. Rash and nonspecific skin eruption ? etiology - clotrimazole-betamethasone (LOTRISONE) cream; Apply 1 application topically 2 (two) times daily.  Dispense: 30 g; Refill: 0 - rosuvastatin (CRESTOR) 20 MG tablet; take 1 tablet by mouth once daily  Dispense: 30 tablet; Refill: 5  2. Other and unspecified hyperlipidemia Check labs - rosuvastatin (CRESTOR) 20 MG tablet; take 1 tablet by mouth once daily  Dispense: 30 tablet; Refill: 5 - Hepatic function panel - Lipid panel  3. HTN (hypertension) Slightly elevated today--lisinopril elevated - metoprolol succinate (TOPROL-XL) 50 MG 24 hr tablet; Take with or immediately following a meal.  Dispense: 90 tablet; Refill: 3 - lisinopril (PRINIVIL,ZESTRIL) 10 MG tablet; Take 1 tablet (10 mg total) by mouth daily.  Dispense: 90 tablet; Refill: 3 - Basic metabolic panel  4. Estrogen deficiency  - DG Bone Density; Future  5. Other screening breast examination  - MM DIGITAL SCREENING BILATERAL; Future  6. Type II or unspecified type diabetes mellitus with unspecified complication, uncontrolled Per  endo - Ambulatory referral to Endocrinology  7. Need for pneumococcal vaccine Given today - Pneumococcal polysaccharide vaccine 23-valent greater than or equal to 2yo subcutaneous/IM

## 2013-11-17 NOTE — Telephone Encounter (Signed)
Message copied by Arnette NorrisPAYNE, Kionna Brier P on Tue Nov 17, 2013  9:59 AM ------      Message from: Lelon PerlaLOWNE, YVONNE R      Created: Tue Nov 17, 2013  9:43 AM       Cancel lisinopril 5 mg at [facility] [address] ------

## 2013-11-18 ENCOUNTER — Telehealth: Payer: Self-pay | Admitting: Family Medicine

## 2013-11-18 NOTE — Telephone Encounter (Signed)
Relevant patient education mailed to patient.  

## 2013-12-17 ENCOUNTER — Ambulatory Visit
Admission: RE | Admit: 2013-12-17 | Discharge: 2013-12-17 | Disposition: A | Discharge status: Home / Self Care | Payer: Medicare HMO | Source: Ambulatory Visit | Attending: Family Medicine | Admitting: Family Medicine

## 2013-12-17 DIAGNOSIS — E2839 Other primary ovarian failure: Secondary | ICD-10-CM

## 2013-12-17 DIAGNOSIS — Z1239 Encounter for other screening for malignant neoplasm of breast: Secondary | ICD-10-CM

## 2013-12-22 ENCOUNTER — Ambulatory Visit (INDEPENDENT_AMBULATORY_CARE_PROVIDER_SITE_OTHER): Payer: Medicare HMO | Admitting: Family Medicine

## 2013-12-22 ENCOUNTER — Other Ambulatory Visit: Payer: Self-pay | Admitting: Family Medicine

## 2013-12-22 ENCOUNTER — Encounter: Payer: Self-pay | Admitting: Family Medicine

## 2013-12-22 VITALS — BP 124/62 | HR 55 | Temp 98.6°F | Ht 65.0 in | Wt 176.4 lb

## 2013-12-22 DIAGNOSIS — Z01818 Encounter for other preprocedural examination: Secondary | ICD-10-CM

## 2013-12-22 NOTE — Progress Notes (Signed)
Pre visit review using our clinic review tool, if applicable. No additional management support is needed unless otherwise documented below in the visit note. 

## 2013-12-22 NOTE — Patient Instructions (Signed)
Cataract A cataract is a clouding of the lens of the eye. When a lens becomes cloudy, vision is reduced based on the degree and nature of the clouding. Many cataracts reduce vision to some degree. Some cataracts make people more near-sighted as they develop. Other cataracts increase glare. Cataracts that are ignored and become worse can sometimes look white. The white color can be seen through the pupil. CAUSES   Aging. However, cataracts may occur at any age, even in newborns.  Certain drugs.  Trauma to the eye.  Certain diseases such as diabetes.  Specific eye diseases such as chronic inflammation inside the eye or a sudden attack of a rare form of glaucoma.  Inherited or acquired medical problems. SYMPTOMS   Gradual, progressive drop in vision in the affected eye.  Severe, rapid visual loss. This most often happens when trauma is the cause. DIAGNOSIS  To detect a cataract, an eye doctor examines the lens. Cataracts are best diagnosed with an exam of the eyes with the pupils enlarged (dilated) by drops.  TREATMENT  For an early cataract, vision may improve by using different eyeglasses or stronger lighting. If that does not help your vision, surgery is the only effective treatment. A cataract needs to be surgically removed when vision loss interferes with your everyday activities, such as driving, reading, or watching TV. A cataract may also have to be removed if it prevents examination or treatment of another eye problem. Surgery removes the cloudy lens and usually replaces it with a substitute lens (intraocular lens, IOL).  At a time when both you and your doctor agree, the cataract will be surgically removed. If you have cataracts in both eyes, only one is usually removed at a time. This allows the operated eye to heal and be out of danger from any possible problems after surgery (such as infection or poor wound healing). In rare cases, a cataract may be doing damage to your eye. In  these cases, your caregiver may advise surgical removal right away. The vast majority of people who have cataract surgery have better vision afterward. HOME CARE INSTRUCTIONS  If you are not planning surgery, you may be asked to do the following:  Use different eyeglasses.  Use stronger or brighter lighting.  Ask your eye doctor about reducing your medicine dose or changing medicines if it is thought that a medicine caused your cataract. Changing medicines does not make the cataract go away on its own.  Become familiar with your surroundings. Poor vision can lead to injury. Avoid bumping into things on the affected side. You are at a higher risk for tripping or falling.  Exercise extreme care when driving or operating machinery.  Wear sunglasses if you are sensitive to bright light or experiencing problems with glare. SEEK IMMEDIATE MEDICAL CARE IF:   You have a worsening or sudden vision loss.  You notice redness, swelling, or increasing pain in the eye.  You have a fever. Document Released: 09/24/2005 Document Revised: 12/17/2011 Document Reviewed: 05/18/2011 Exit[employer] Patient Information 2014 [employer].  

## 2013-12-22 NOTE — Progress Notes (Signed)
Subjective:    [name] is a 68 y.o. female who presents to the office today for a preoperative consultation at the request of surgeon Dr Herbert Deaner who plans on performing cataract surgery  on April 7. This consultation is requested for the specific conditions prompting preoperative evaluation (i.e. because of potential affect on operative risk): diabetes, htn cholesterol. Planned anesthesia: local. The patient has the following known anesthesia issues: none. Patients bleeding risk: no recent abnormal bleeding.  The following portions of the patient's history were reviewed and updated as appropriate:  She  has a past medical history of Diabetes mellitus; Hypertension; Hyperlipidemia; Depression; Restless leg syndrome; Pancreatitis; and Blockage of coronary artery of heart. She  does not have any pertinent problems on file. She  has past surgical history that includes Tubal ligation; Rotator cuff repair; and Coronary angioplasty with stent. Her family history includes Breast cancer in an other family member; Cancer in her other; Lung cancer in an other family member; Thyroid cancer in an other family member; Uterine cancer in an other family member. She  reports that she has quit smoking. She has never used smokeless tobacco. She reports that she does not drink alcohol or use illicit drugs. She has a current medication list which includes the following prescription(s): amlodipine, aspirin ec, tai doc control, clotrimazole-betamethasone, fexofenadine, pen needles 31gx5/16", insulin syringe-needle u-100, iron-vitamins, lancing device, lisinopril, metoprolol succinate, multivitamin with minerals, novolin n relion, novolin r relion, omega-3 acid ethyl esters, pharmacist choice lancets, prodigy no coding blood gluc, and rosuvastatin. Current Outpatient Prescriptions on File Prior to Visit  Medication Sig Dispense Refill  . amLODipine (NORVASC) 10 MG tablet take 1 tablet by mouth once daily  90 tablet   1  . aspirin EC 81 MG tablet Take 81 mg by mouth daily.      . Blood Glucose Calibration (TAI DOC CONTROL) NORMAL SOLN       . clotrimazole-betamethasone (LOTRISONE) cream Apply 1 application topically 2 (two) times daily.  30 g  0  . fexofenadine (ALLEGRA) 180 MG tablet Take 180 mg by mouth daily. Takes 4 days a week      . Insulin Pen Needle (PEN NEEDLES 31GX5/16") 31G X 8 MM MISC by Does not apply route daily.        . Insulin Syringe-Needle U-100 (BD INSULIN SYRINGE ULTRAFINE) 31G X 5/16" 0.3 ML MISC 10 Units by Does not apply route 3 (three) times daily before meals.        . Iron-Vitamins (GERITOL PO) Take 1 tablet by mouth daily.      Elmore Guise Devices (LANCING DEVICE) MISC       . lisinopril (PRINIVIL,ZESTRIL) 10 MG tablet Take 1 tablet (10 mg total) by mouth daily.  90 tablet  3  . metoprolol succinate (TOPROL-XL) 50 MG 24 hr tablet Take with or immediately following a meal.  90 tablet  3  . Multiple Vitamin (MULTIVITAMIN WITH MINERALS) TABS Take 1 tablet by mouth daily.      [name] NOVOLIN N RELION 100 UNIT/ML injection Inject 50 Units into the skin at bedtime.       [name] NOVOLIN R RELION 100 UNIT/ML injection Inject 30 Units into the skin 3 (three) times daily.      [name] PHARMACIST CHOICE LANCETS MISC       . PRODIGY NO CODING BLOOD GLUC test strip TEST BLOOD SUGAR UP TO 3 TIMES A DAY OR AS DIRECTED.  100 each  1  . rosuvastatin (CRESTOR)  20 MG tablet take 1 tablet by mouth once daily  30 tablet  5   No current facility-administered medications on file prior to visit.   She is allergic to levemir and metformin and related..  Review of Systems Pertinent items are noted in HPI.    Objective:    BP 124/62  Pulse 55  Temp(Src) 98.6 F (37 C) (Oral)  Ht '5\' 5"'  (1.651 m)  Wt 176 lb 6.4 oz (80.015 kg)  BMI 29.35 kg/m2  SpO2 96% General appearance: alert, cooperative, appears stated age and no distress Head: Normocephalic, without obvious abnormality, atraumatic Ears: normal TM's and  external ear canals both ears Nose: Nares normal. Septum midline. Mucosa normal. No drainage or sinus tenderness. Throat: lips, mucosa, and tongue normal; teeth and gums normal Neck: no adenopathy, no carotid bruit, no JVD, supple, symmetrical, trachea midline and thyroid not enlarged, symmetric, no tenderness/mass/nodules Back: symmetric, no curvature. ROM normal. No CVA tenderness. Lungs: clear to auscultation bilaterally Heart: S1, S2 normal Extremities: extremities normal, atraumatic, no cyanosis or edema  Cardiographics ECG: no change since previous ECG dated 10/23/2012 Echocardiogram: not done  Imaging Chest x-ray: not done   Lab Review  Office Visit on 11/17/2013  Component Date Value  . HM Colonoscopy 04/23/2007 [place]   . HM Diabetic Eye Exam 07/08/2013 Cataracts   . Sodium 11/17/2013 138   . Potassium 11/17/2013 3.6   . Chloride 11/17/2013 103   . CO2 11/17/2013 27   . Glucose, Bld 11/17/2013 290*  . BUN 11/17/2013 15   . Creatinine, Ser 11/17/2013 0.9   . Calcium 11/17/2013 9.8   . GFR 11/17/2013 84.43   . Total Bilirubin 11/17/2013 0.7   . Bilirubin, Direct 11/17/2013 0.0   . Alkaline Phosphatase 11/17/2013 69   . AST 11/17/2013 19   . ALT 11/17/2013 26   . Total Protein 11/17/2013 7.2   . Albumin 11/17/2013 4.1   . Cholesterol 11/17/2013 113   . Triglycerides 11/17/2013 278.0*  . HDL 11/17/2013 33.40*  . VLDL 11/17/2013 55.6*  . Total CHOL/HDL Ratio 11/17/2013 3   . Direct LDL 11/17/2013 57.2   Admission on 10/27/2013, Discharged on 10/27/2013  Component Date Value  . WBC 10/27/2013 8.0   . RBC 10/27/2013 4.12   . Hemoglobin 10/27/2013 12.2   . HCT 10/27/2013 35.6*  . MCV 10/27/2013 86.4   . [place] 10/27/2013 29.6   . MCHC 10/27/2013 34.3   . RDW 10/27/2013 13.3   . Platelets 10/27/2013 177   . Neutrophils Relative % 10/27/2013 38*  . Neutro Abs 10/27/2013 3.0   . Lymphocytes Relative 10/27/2013 54*  . Lymphs Abs 10/27/2013 4.3*  . Monocytes  Relative 10/27/2013 6   . Monocytes Absolute 10/27/2013 0.5   . Eosinophils Relative 10/27/2013 2   . Eosinophils Absolute 10/27/2013 0.2   . Basophils Relative 10/27/2013 0   . Basophils Absolute 10/27/2013 0.0   . Sodium 10/27/2013 140   . Potassium 10/27/2013 3.9   . Chloride 10/27/2013 102   . CO2 10/27/2013 23   . Glucose, Bld 10/27/2013 213*  . BUN 10/27/2013 17   . Creatinine, Ser 10/27/2013 0.65   . Calcium 10/27/2013 9.4   . GFR calc non Af Amer 10/27/2013 90*  . GFR calc Af Amer 10/27/2013 >90   . Troponin I 10/27/2013 <0.30       Assessment:      68 y.o. female with planned surgery as above.   Known risk factors for perioperative complications: Diabetes  mellitus  --pt cleared for cataract surgery  Cardiac Risk Estimation: low    Plan:    1. Preoperative workup as follows none. 2. Change in medication regimen before surgery: per surgeon. 3. Prophylaxis for cardiac events with perioperative beta-blockers: not indicated. 4. Invasive hemodynamic monitoring perioperatively: not indicated. 5. Deep vein thrombosis prophylaxis postoperatively:na. 6. Surveillance for postoperative MI with ECG immediately postoperatively and on postoperative days 1 and 2 AND troponin levels 24 hours postoperatively and on day 4 or hospital discharge (whichever comes first): not indicated.

## 2013-12-25 MED ORDER — ALENDRONATE SODIUM 70 MG PO TABS: 70.0000 mg | ORAL_TABLET | ORAL | Status: DC

## 2014-01-01 ENCOUNTER — Other Ambulatory Visit: Payer: Self-pay | Admitting: Family Medicine

## 2014-01-12 ENCOUNTER — Encounter: Payer: Self-pay | Admitting: Family Medicine

## 2014-02-04 ENCOUNTER — Other Ambulatory Visit: Payer: Self-pay | Admitting: Family Medicine

## 2014-03-22 ENCOUNTER — Telehealth: Payer: Self-pay | Admitting: Family Medicine

## 2014-03-22 DIAGNOSIS — E119 Type 2 diabetes mellitus without complications: Secondary | ICD-10-CM

## 2014-03-22 MED ORDER — GLUCOSE BLOOD VI STRP: ORAL_STRIP | Status: DC

## 2014-03-22 NOTE — Telephone Encounter (Signed)
Spoke with [name] at [facility] and gave a verbal order to dispense #400 Test strips (Accu-Check Aviva) with 3 additional refills. EMR updated as well

## 2014-03-22 NOTE — Telephone Encounter (Signed)
[name] from [facility] called to obtain verbal order for Diabetic supplies for [name]. Also [name] stated that [name](RN) gave verbal order last month but was not enough test strips. Please advise  Contact number: [phone]

## 2014-04-06 ENCOUNTER — Other Ambulatory Visit: Payer: Self-pay | Admitting: Family Medicine

## 2014-04-15 ENCOUNTER — Other Ambulatory Visit: Payer: Self-pay | Admitting: Family Medicine

## 2014-04-15 DIAGNOSIS — I10 Essential (primary) hypertension: Secondary | ICD-10-CM

## 2014-04-15 NOTE — Telephone Encounter (Signed)
[facility]

## 2014-05-25 ENCOUNTER — Ambulatory Visit: Payer: Medicare HMO | Admitting: Family Medicine

## 2014-05-25 DIAGNOSIS — Z0289 Encounter for other administrative examinations: Secondary | ICD-10-CM

## 2014-06-11 ENCOUNTER — Other Ambulatory Visit: Payer: Self-pay | Admitting: Family Medicine

## 2014-06-12 ENCOUNTER — Other Ambulatory Visit: Payer: Self-pay | Admitting: Family Medicine

## 2014-06-17 ENCOUNTER — Telehealth: Payer: Self-pay | Admitting: Family Medicine

## 2014-06-17 NOTE — Telephone Encounter (Signed)
Lipitor 20 mg #30  1 po qhs, 2 refills

## 2014-06-17 NOTE — Telephone Encounter (Signed)
Caller name: Kathreen Relation to pt: self Call back number: [phone] [facility]acy[facility] [address]  Reason for call:  Patient states that she is out of crestor. She states that crestor is too expensive and would like to know if something else could be called in.

## 2014-06-17 NOTE — Telephone Encounter (Signed)
Please advise      KP 

## 2014-06-18 MED ORDER — ATORVASTATIN CALCIUM 20 MG PO TABS: 20.0000 mg | ORAL_TABLET | Freq: Every day | ORAL | Status: DC

## 2014-06-18 NOTE — Telephone Encounter (Signed)
Rx sent, VM full unable to leave a message     KP

## 2014-06-21 ENCOUNTER — Telehealth: Payer: Self-pay | Admitting: Family Medicine

## 2014-06-21 ENCOUNTER — Ambulatory Visit: Payer: Commercial Managed Care - HMO | Admitting: Family Medicine

## 2014-06-21 NOTE — Telephone Encounter (Signed)
MED CHECK NO SHOW

## 2014-06-21 NOTE — Telephone Encounter (Signed)
Spoke with patient who states that she was called out of town due to a sick family member. She forgot about her appt. She is rescheduled for 1100am Tues 9/15.

## 2014-06-21 NOTE — Telephone Encounter (Signed)
Please find out why she no showed-- she spoke with office last week

## 2014-06-21 NOTE — Telephone Encounter (Signed)
No charge. 

## 2014-06-22 ENCOUNTER — Encounter: Payer: Self-pay | Admitting: Family Medicine

## 2014-06-22 ENCOUNTER — Ambulatory Visit (INDEPENDENT_AMBULATORY_CARE_PROVIDER_SITE_OTHER): Payer: Commercial Managed Care - HMO | Admitting: Family Medicine

## 2014-06-22 VITALS — BP 116/62 | HR 63 | Temp 98.8°F | Wt 171.3 lb

## 2014-06-22 DIAGNOSIS — E785 Hyperlipidemia, unspecified: Secondary | ICD-10-CM

## 2014-06-22 DIAGNOSIS — E1159 Type 2 diabetes mellitus with other circulatory complications: Secondary | ICD-10-CM

## 2014-06-22 DIAGNOSIS — I10 Essential (primary) hypertension: Secondary | ICD-10-CM

## 2014-06-22 LAB — BASIC METABOLIC PANEL
BUN: 14 mg/dL (ref 6–23)
CHLORIDE: 104 meq/L (ref 96–112)
CO2: 27 mEq/L (ref 19–32)
Calcium: 9.4 mg/dL (ref 8.4–10.5)
Creatinine, Ser: 0.8 mg/dL (ref 0.4–1.2)
GFR: 94.33 mL/min (ref >60.00)
Glucose, Bld: 201 mg/dL — ABNORMAL HIGH (ref 70–99)
Potassium: 4.3 mEq/L (ref 3.5–5.1)
SODIUM: 139 meq/L (ref 135–145)

## 2014-06-22 LAB — LIPID PANEL
CHOL/HDL RATIO: 6
CHOLESTEROL: 178 mg/dL (ref 0–200)
HDL: 31.3 mg/dL — ABNORMAL LOW (ref >39.00)
LDL CALC: 115 mg/dL — AB (ref 0–99)
NonHDL: 146.7
Triglycerides: 157 mg/dL — ABNORMAL HIGH (ref 0.0–149.0)
VLDL: 31.4 mg/dL (ref 0.0–40.0)

## 2014-06-22 LAB — HEPATIC FUNCTION PANEL
ALK PHOS: 64 U/L (ref 39–117)
ALT: 23 U/L (ref 0–35)
AST: 22 U/L (ref 0–37)
Albumin: 3.8 g/dL (ref 3.5–5.2)
BILIRUBIN DIRECT: 0.2 mg/dL (ref 0.0–0.3)
BILIRUBIN TOTAL: 1.5 mg/dL — AB (ref 0.2–1.2)
Total Protein: 6.8 g/dL (ref 6.0–8.3)

## 2014-06-22 MED ORDER — ALENDRONATE SODIUM 70 MG PO TABS: 70.0000 mg | ORAL_TABLET | ORAL | Status: DC

## 2014-06-22 NOTE — Patient Instructions (Signed)

## 2014-06-22 NOTE — Progress Notes (Signed)
Subjective:    Patient ID: [name], female    DOB: 08-Jul-1946, 68 y.o.   MRN: [id number]  HPI Pt here to f/u bp and cholesterol.  She sees Dr Chalmers Cater for her diabetes.  HPI HYPERTENSION  Blood pressure range-not checking  Chest pain- no      Dyspnea- no Lightheadedness- no   Edema- no Other side effects - no   Medication compliance: good Low salt diet- yes  DIABETES  Blood Sugar ranges-good per pt  Polyuria- no New Visual problems- no Hypoglycemic symptoms- no Other side effects-no Medication compliance - good Last eye exam- this year Foot exam- endo  HYPERLIPIDEMIA  Medication compliance- good RUQ pain- no  Muscle aches- no Other side effects-no  Review of Systems As above  Past Medical History  Diagnosis Date  . Diabetes mellitus   . Hypertension   . Hyperlipidemia   . Depression   . Restless leg syndrome   . Pancreatitis   . Blockage of coronary artery of heart    History   Social History  . Marital Status: Married    Spouse Name: N/A    Number of Children: N/A  . Years of Education: N/A   Occupational History  . Not on file.   Social History Main Topics  . Smoking status: Former Smoker -- 0.00 packs/day  . Smokeless tobacco: Never Used  . Alcohol Use: No  . Drug Use: No  . Sexual Activity: Not on file   Other Topics Concern  . Not on file   Social History Narrative   Exercise: seldom   Caffeine use: seldom   Family History  Problem Relation Age of Onset  . Lung cancer    . Breast cancer    . Thyroid cancer    . Uterine cancer    . Cancer Other     lung,thyroid,breast,uterine   Current Outpatient Prescriptions  Medication Sig Dispense Refill  . alendronate (FOSAMAX) 70 MG tablet Take 1 tablet (70 mg total) by mouth every 7 (seven) days. Take with a full glass of water on an empty stomach.  4 tablet  11  . amLODipine (NORVASC) 10 MG tablet take 1 tablet by mouth once daily  90 tablet  1  . aspirin EC 81 MG tablet Take 81  mg by mouth daily.      [name] atorvastatin (LIPITOR) 20 MG tablet Take 1 tablet (20 mg total) by mouth daily.  30 tablet  2  . Blood Glucose Calibration (TAI DOC CONTROL) NORMAL SOLN       . clotrimazole-betamethasone (LOTRISONE) cream Apply 1 application topically 2 (two) times daily.  30 g  0  . fexofenadine (ALLEGRA) 180 MG tablet Take 180 mg by mouth daily. Takes 4 days a week      . glucose blood (ACCU-CHEK AVIVA) test strip Use as instructed  400 each  3  . Insulin Pen Needle (PEN NEEDLES 31GX5/16") 31G X 8 MM MISC by Does not apply route daily.        . Insulin Syringe-Needle U-100 (BD INSULIN SYRINGE ULTRAFINE) 31G X 5/16" 0.3 ML MISC 10 Units by Does not apply route 3 (three) times daily before meals.        . Iron-Vitamins (GERITOL PO) Take 1 tablet by mouth daily.      Elmore Guise Devices (LANCING DEVICE) MISC       . lisinopril (PRINIVIL,ZESTRIL) 10 MG tablet Take 1 tablet (10 mg total) by mouth daily.  90 tablet  3  . metoprolol succinate (TOPROL-XL) 50 MG 24 hr tablet Take with or immediately following a meal.  90 tablet  3  . Multiple Vitamin (MULTIVITAMIN WITH MINERALS) TABS Take 1 tablet by mouth daily.      [name] NOVOLIN N RELION 100 UNIT/ML injection Inject 50 Units into the skin at bedtime.       [name] NOVOLIN R RELION 100 UNIT/ML injection Inject 30 Units into the skin 3 (three) times daily.      [name] omega-3 acid ethyl esters (LOVAZA) 1 G capsule take 2 capsules twice a day  120 capsule  1  . PHARMACIST CHOICE LANCETS MISC        No current facility-administered medications for this visit.   Allergies  Allergen Reactions  . Levemir [Insulin Detemir] Hives and Swelling  . Metformin And Related Other (See Comments)    Sick on stomach, sweaty, throw up, pain       Objective:   Physical Exam BP 116/62  Pulse 63  Temp(Src) 98.8 F (37.1 C) (Oral)  Wt 171 lb 4.8 oz (77.7 kg)  SpO2 97% General appearance: alert, cooperative, appears stated age and no distress Neck: no adenopathy,  supple, symmetrical, trachea midline and thyroid not enlarged, symmetric, no tenderness/mass/nodules Lungs: clear to auscultation bilaterally Heart: S1, S2 normal Extremities: extremities normal, atraumatic, no cyanosis or edema        Assessment & Plan:  1. Type II or unspecified type diabetes mellitus with peripheral circulatory disorders, uncontrolled(250.72) Per endo - Basic metabolic panel  2. Other and unspecified hyperlipidemia Check labs, con't meds - Hepatic function panel - Lipid panel  3. Essential hypertension Stable, con't meds - Microalbumin / creatinine urine ratio - POCT urinalysis dipstick

## 2014-06-22 NOTE — Progress Notes (Signed)
Pre visit review using our clinic review tool, if applicable. No additional management support is needed unless otherwise documented below in the visit note. 

## 2014-06-22 NOTE — Telephone Encounter (Signed)
Please be sure this patient was not charged a no show fee. She was rescheduled for Tues 9/15.

## 2014-06-23 LAB — MICROALBUMIN / CREATININE URINE RATIO
CREATININE, U: 243.1 mg/dL
MICROALB UR: 30 mg/dL — AB (ref 0.0–1.9)
Microalb Creat Ratio: 12.3 mg/g (ref 0.0–30.0)

## 2014-06-24 ENCOUNTER — Telehealth: Payer: Self-pay | Admitting: Family Medicine

## 2014-06-24 DIAGNOSIS — E119 Type 2 diabetes mellitus without complications: Secondary | ICD-10-CM

## 2014-06-24 NOTE — Telephone Encounter (Signed)
Done referral through acuity

## 2014-06-24 NOTE — Telephone Encounter (Signed)
Ref placed for this patient, she is already Established.     KP

## 2014-06-24 NOTE — Telephone Encounter (Signed)
Patient needs a referral to see her Endocrinologist Dr. Talmage Nap since she has [facility]

## 2014-07-05 ENCOUNTER — Other Ambulatory Visit: Payer: Self-pay

## 2014-07-05 MED ORDER — ATORVASTATIN CALCIUM 40 MG PO TABS: 40.0000 mg | ORAL_TABLET | Freq: Every day | ORAL | Status: DC

## 2014-07-13 ENCOUNTER — Ambulatory Visit (INDEPENDENT_AMBULATORY_CARE_PROVIDER_SITE_OTHER): Payer: Commercial Managed Care - HMO

## 2014-07-13 DIAGNOSIS — Z111 Encounter for screening for respiratory tuberculosis: Secondary | ICD-10-CM

## 2014-07-15 LAB — TB SKIN TEST: TB SKIN TEST: NEGATIVE

## 2014-08-11 ENCOUNTER — Telehealth: Payer: Self-pay | Admitting: Family Medicine

## 2014-08-11 DIAGNOSIS — E119 Type 2 diabetes mellitus without complications: Secondary | ICD-10-CM

## 2014-08-11 NOTE — Telephone Encounter (Signed)
Order in,      [place]

## 2014-08-11 NOTE — Telephone Encounter (Signed)
Caller name:Osbourn, Shalee Relation to ZO:XWRUpt:self Call back number:[phone] Pharmacy:  Reason for call: pt is needing a referral to [facility], pt is already a pt there, pt has [employer].

## 2014-09-17 ENCOUNTER — Encounter: Payer: Self-pay | Admitting: Family Medicine

## 2014-09-17 ENCOUNTER — Ambulatory Visit (INDEPENDENT_AMBULATORY_CARE_PROVIDER_SITE_OTHER): Payer: Commercial Managed Care - HMO | Admitting: Family Medicine

## 2014-09-17 VITALS — BP 110/64 | HR 60 | Temp 98.4°F | Wt 167.0 lb

## 2014-09-17 DIAGNOSIS — E1165 Type 2 diabetes mellitus with hyperglycemia: Secondary | ICD-10-CM

## 2014-09-17 DIAGNOSIS — I10 Essential (primary) hypertension: Secondary | ICD-10-CM

## 2014-09-17 DIAGNOSIS — E785 Hyperlipidemia, unspecified: Secondary | ICD-10-CM

## 2014-09-17 DIAGNOSIS — F419 Anxiety disorder, unspecified: Secondary | ICD-10-CM

## 2014-09-17 DIAGNOSIS — IMO0002 Reserved for concepts with insufficient information to code with codable children: Secondary | ICD-10-CM

## 2014-09-17 DIAGNOSIS — Z8639 Personal history of other endocrine, nutritional and metabolic disease: Secondary | ICD-10-CM

## 2014-09-17 MED ORDER — OMEGA-3-ACID ETHYL ESTERS 1 G PO CAPS: 2.0000 | ORAL_CAPSULE | Freq: Two times a day (BID) | ORAL | Status: DC

## 2014-09-17 MED ORDER — SERTRALINE HCL 50 MG PO TABS: 50.0000 mg | ORAL_TABLET | Freq: Every day | ORAL | Status: DC

## 2014-09-17 MED ORDER — ALPRAZOLAM 0.25 MG PO TABS: 0.2500 mg | ORAL_TABLET | Freq: Three times a day (TID) | ORAL | Status: DC | PRN

## 2014-09-17 NOTE — Patient Instructions (Signed)
Generalized Anxiety Disorder Generalized anxiety disorder (GAD) is a mental disorder. It interferes with life functions, including relationships, work, and school. GAD is different from normal anxiety, which everyone experiences at some point in their lives in response to specific life events and activities. Normal anxiety actually helps [name] prepare for and get through these life events and activities. Normal anxiety goes away after the event or activity is over.  GAD causes anxiety that is not necessarily related to specific events or activities. It also causes excess anxiety in proportion to specific events or activities. The anxiety associated with GAD is also difficult to control. GAD can vary from mild to severe. People with severe GAD can have intense waves of anxiety with physical symptoms (panic attacks).  SYMPTOMS The anxiety and worry associated with GAD are difficult to control. This anxiety and worry are related to many life events and activities and also occur more days than not for 6 months or longer. People with GAD also have three or more of the following symptoms (one or more in children):  Restlessness.   Fatigue.  Difficulty concentrating.   Irritability.  Muscle tension.  Difficulty sleeping or unsatisfying sleep. DIAGNOSIS GAD is diagnosed through an assessment by your health care provider. Your health care provider will ask you questions aboutyour mood,physical symptoms, and events in your life. Your health care provider may ask you about your medical history and use of alcohol or drugs, including prescription medicines. Your health care provider may also do a physical exam and blood tests. Certain medical conditions and the use of certain substances can cause symptoms similar to those associated with GAD. Your health care provider may refer you to a mental health specialist for further evaluation. TREATMENT The following therapies are usually used to treat GAD:    Medication. Antidepressant medication usually is prescribed for long-term daily control. Antianxiety medicines may be added in severe cases, especially when panic attacks occur.   Talk therapy (psychotherapy). Certain types of talk therapy can be helpful in treating GAD by providing support, education, and guidance. A form of talk therapy called cognitive behavioral therapy can teach you healthy ways to think about and react to daily life events and activities.  Stress managementtechniques. These include yoga, meditation, and exercise and can be very helpful when they are practiced regularly. A mental health specialist can help determine which treatment is best for you. Some people see improvement with one therapy. However, other people require a combination of therapies. Document Released: 01/19/2013 Document Revised: 02/08/2014 Document Reviewed: 01/19/2013 [facility] Patient Information 2015 [place], [place]. This information is not intended to replace advice given to you by your health care provider. Make sure you discuss any questions you have with your health care provider.  Diabetes and Standards of Medical Care Diabetes is complicated. You may find that your diabetes team includes a dietitian, nurse, diabetes educator, eye doctor, and more. To help everyone know what is going on and to help you get the care you deserve, the following schedule of care was developed to help keep you on track. Below are the tests, exams, vaccines, medicines, education, and plans you will need. HbA1c test This test shows how well you have controlled your glucose over the past 2-3 months. It is used to see if your diabetes management plan needs to be adjusted.   It is performed at least 2 times a year if you are meeting treatment goals.  It is performed 4 times a year if therapy has changed  or if you are not meeting treatment goals. Blood pressure test  This test is performed at every routine medical visit.  The goal is less than 140/90 mm Hg for most people, but 130/80 mm Hg in some cases. Ask your health care provider about your goal. Dental exam  Follow up with the dentist regularly. Eye exam  If you are diagnosed with type 1 diabetes as a child, get an exam upon reaching the age of 23 years or older and have had diabetes for 3-5 years. Yearly eye exams are recommended after that initial eye exam.  If you are diagnosed with type 1 diabetes as an adult, get an exam within 5 years of diagnosis and then yearly.  If you are diagnosed with type 2 diabetes, get an exam as soon as possible after the diagnosis and then yearly. Foot care exam  Visual foot exams are performed at every routine medical visit. The exams check for cuts, injuries, or other problems with the feet.  A comprehensive foot exam should be done yearly. This includes visual inspection as well as assessing foot pulses and testing for loss of sensation.  Check your feet nightly for cuts, injuries, or other problems with your feet. Tell your health care provider if anything is not healing. Kidney function test (urine microalbumin)  This test is performed once a year.  Type 1 diabetes: The first test is performed 5 years after diagnosis.  Type 2 diabetes: The first test is performed at the time of diagnosis.  A serum creatinine and estimated glomerular filtration rate (eGFR) test is done once a year to assess the level of chronic kidney disease (CKD), if present. Lipid profile (cholesterol, HDL, LDL, triglycerides)  Performed every 5 years for most people.  The goal for LDL is less than 100 mg/dL. If you are at high risk, the goal is less than 70 mg/dL.  The goal for HDL is 40 mg/dL-50 mg/dL for men and 50 mg/dL-60 mg/dL for women. An HDL cholesterol of 60 mg/dL or higher gives some protection against heart disease.  The goal for triglycerides is less than 150 mg/dL. Influenza vaccine, pneumococcal vaccine, and hepatitis B  vaccine  The influenza vaccine is recommended yearly.  It is recommended that people with diabetes who are over 6 years old get the pneumonia vaccine. In some cases, two separate shots may be given. Ask your health care provider if your pneumonia vaccination is up to date.  The hepatitis B vaccine is also recommended for adults with diabetes. Diabetes self-management education  Education is recommended at diagnosis and ongoing as needed. Treatment plan  Your treatment plan is reviewed at every medical visit. Document Released: 07/22/2009 Document Revised: 02/08/2014 Document Reviewed: 02/24/2013 [facility] Patient Information 2015 [place], [place]. This information is not intended to replace advice given to you by your health care provider. Make sure you discuss any questions you have with your health care provider.

## 2014-09-17 NOTE — Progress Notes (Signed)
Pre visit review using our clinic review tool, if applicable. No additional management support is needed unless otherwise documented below in the visit note. 

## 2014-09-17 NOTE — Progress Notes (Addendum)
   Subjective:    Patient ID: [name], female    DOB: 03/09/1946, 68 y.o.   MRN: [id number]  HPI Pt here for f/u dm, hyperlipidemia, htn and c/o inc anxiety/ depression.  HYPERTENSION  Blood pressure range-not checking   Chest pain- no      Dyspnea- no Lightheadedness- no   Edema- no Other side effects - no   Medication compliance: good Low salt diet- yes  DIABETES  Blood Sugar ranges-48-260  Polyuria- no New Visual problems- no Hypoglycemic symptoms- no Other side effects-no Medication compliance - good Last eye exam- 02/2014 Foot exam- today  HYPERLIPIDEMIA  Medication compliance- good RUQ pain- no  Muscle aches- no Other side effects-no    Review of Systems    as above Objective:   Physical Exam  BP 110/64 mmHg  Pulse 60  Temp(Src) 98.4 F (36.9 C) (Oral)  Wt 167 lb (75.751 kg)  SpO2 100% General appearance: alert, cooperative, appears stated age and no distress Throat: lips, mucosa, and tongue normal; teeth and gums normal Neck: no adenopathy, no carotid bruit, no JVD, supple, symmetrical, trachea midline and thyroid not enlarged, symmetric, no tenderness/mass/nodules Lungs: clear to auscultation bilaterally Heart: S1, S2 normal Extremities: extremities normal, atraumatic, no cyanosis or edema Psych-- pt c/o in anxiety , crying a lot because of stress with family--she is not suicidal or homicidal      Assessment & Plan:  1. Diabetes w/uns comp type II/ uns uncontrolled Check labs con't meds - Basic metabolic panel; Future - Hemoglobin A1c; Future - Microalbumin / creatinine urine ratio; Future - POCT urinalysis dipstick; Future  2. Essential hypertension Stable, con't meds  - Basic metabolic panel; Future - Microalbumin / creatinine urine ratio; Future - POCT urinalysis dipstick; Future  3. Hyperlipidemia Check labs  - Hepatic function panel; Future - Lipid panel; Future - Microalbumin / creatinine urine ratio; Future - POCT  urinalysis dipstick; Future - omega-3 acid ethyl esters (LOVAZA) 1 G capsule; Take 2 capsules (2 g total) by mouth 2 (two) times daily.  Dispense: 120 capsule; Refill: 5  4. History of goiter  - TSH; Future  5. Anxiety  - sertraline (ZOLOFT) 50 MG tablet; Take 1 tablet (50 mg total) by mouth daily.  Dispense: 30 tablet; Refill: 3 - ALPRAZolam (XANAX) 0.25 MG tablet; Take 1 tablet (0.25 mg total) by mouth 3 (three) times daily as needed for anxiety.  Dispense: 60 tablet; Refill: 0

## 2014-09-21 ENCOUNTER — Other Ambulatory Visit (INDEPENDENT_AMBULATORY_CARE_PROVIDER_SITE_OTHER): Payer: Commercial Managed Care - HMO

## 2014-09-21 DIAGNOSIS — E1165 Type 2 diabetes mellitus with hyperglycemia: Secondary | ICD-10-CM

## 2014-09-21 DIAGNOSIS — Z8639 Personal history of other endocrine, nutritional and metabolic disease: Secondary | ICD-10-CM

## 2014-09-21 DIAGNOSIS — E785 Hyperlipidemia, unspecified: Secondary | ICD-10-CM

## 2014-09-21 DIAGNOSIS — IMO0002 Reserved for concepts with insufficient information to code with codable children: Secondary | ICD-10-CM

## 2014-09-21 DIAGNOSIS — I10 Essential (primary) hypertension: Secondary | ICD-10-CM

## 2014-09-21 LAB — HEPATIC FUNCTION PANEL
ALBUMIN: 4.4 g/dL (ref 3.5–5.2)
ALK PHOS: 60 U/L (ref 39–117)
ALT: 20 U/L (ref 0–35)
AST: 20 U/L (ref 0–37)
BILIRUBIN DIRECT: 0.1 mg/dL (ref 0.0–0.3)
Total Bilirubin: 1 mg/dL (ref 0.2–1.2)
Total Protein: 7.7 g/dL (ref 6.0–8.3)

## 2014-09-21 LAB — LIPID PANEL
CHOL/HDL RATIO: 4
Cholesterol: 155 mg/dL (ref 0–200)
HDL: 37.8 mg/dL — AB (ref >39.00)
LDL CALC: 99 mg/dL (ref 0–99)
NONHDL: 117.2
Triglycerides: 89 mg/dL (ref 0.0–149.0)
VLDL: 17.8 mg/dL (ref 0.0–40.0)

## 2014-09-21 LAB — BASIC METABOLIC PANEL
BUN: 16 mg/dL (ref 6–23)
CO2: 25 mEq/L (ref 19–32)
Calcium: 9.5 mg/dL (ref 8.4–10.5)
Chloride: 104 mEq/L (ref 96–112)
Creatinine, Ser: 0.7 mg/dL (ref 0.4–1.2)
GFR: 103.38 mL/min (ref >60.00)
Glucose, Bld: 135 mg/dL — ABNORMAL HIGH (ref 70–99)
POTASSIUM: 3.7 meq/L (ref 3.5–5.1)
SODIUM: 135 meq/L (ref 135–145)

## 2014-09-21 LAB — HEMOGLOBIN A1C: HEMOGLOBIN A1C: 9.3 % — AB (ref 4.6–6.5)

## 2014-09-21 LAB — TSH: TSH: 0.64 u[IU]/mL (ref 0.35–4.50)

## 2014-09-22 LAB — MICROALBUMIN / CREATININE URINE RATIO
Creatinine,U: 102.4 mg/dL
Microalb Creat Ratio: 14.4 mg/g (ref 0.0–30.0)
Microalb, Ur: 14.7 mg/dL — ABNORMAL HIGH (ref 0.0–1.9)

## 2014-10-07 LAB — POCT URINALYSIS DIPSTICK
Bilirubin, UA: NEGATIVE
Glucose, UA: NEGATIVE
Ketones, UA: NEGATIVE
Leukocytes, UA: NEGATIVE
Nitrite, UA: NEGATIVE
RBC UA: NEGATIVE
Spec Grav, UA: 1.01
UROBILINOGEN UA: 2
pH, UA: 6

## 2014-10-11 ENCOUNTER — Other Ambulatory Visit: Payer: Self-pay | Admitting: Endocrinology

## 2014-10-11 DIAGNOSIS — E049 Nontoxic goiter, unspecified: Secondary | ICD-10-CM

## 2014-10-11 DIAGNOSIS — R809 Proteinuria, unspecified: Secondary | ICD-10-CM | POA: Diagnosis not present

## 2014-10-11 DIAGNOSIS — E1165 Type 2 diabetes mellitus with hyperglycemia: Secondary | ICD-10-CM | POA: Diagnosis not present

## 2014-10-11 DIAGNOSIS — I10 Essential (primary) hypertension: Secondary | ICD-10-CM | POA: Diagnosis not present

## 2014-10-11 DIAGNOSIS — E780 Pure hypercholesterolemia: Secondary | ICD-10-CM | POA: Diagnosis not present

## 2014-10-18 ENCOUNTER — Other Ambulatory Visit: Payer: Self-pay | Admitting: Family Medicine

## 2014-11-03 ENCOUNTER — Telehealth: Payer: Self-pay | Admitting: Family Medicine

## 2014-11-10 ENCOUNTER — Telehealth: Payer: Self-pay | Admitting: Family Medicine

## 2014-11-10 NOTE — Telephone Encounter (Signed)
Please advise 

## 2014-11-10 NOTE — Telephone Encounter (Signed)
Caller name: Virgel ManifoldBennett, Breaun[place] C Relation to pt: self  Call back number: [phone] Pharmacy:  [facility] [place] [address] [phone] (Phone) [phone] (Fax)     Reason for call:  Pt states sertraline (ZOLOFT) 50 MG tablet makes her heart flutter.

## 2014-11-11 MED ORDER — FLUOXETINE HCL 10 MG PO CAPS: 10.0000 mg | ORAL_CAPSULE | Freq: Every day | ORAL | Status: DC

## 2014-11-11 NOTE — Telephone Encounter (Signed)
D/c zoloft prozac 10 mg #30  1 po qd--- ov 2 weeks

## 2014-11-11 NOTE — Telephone Encounter (Signed)
Discussed with patient and she agreed to stopping the Zoloft and starting the Prozac. Apt has been scheduled on 11/25/14 @ 4:15.     KP

## 2014-11-11 NOTE — Telephone Encounter (Signed)
Left a message for call back.  

## 2014-11-11 NOTE — Telephone Encounter (Signed)
Spoke to patient who stated that she started Zoloft on 09/17/14.  She took it for a days until she started experiencing heart palpitations that she described as heart pounding.  She stopped the medication for a week, and ever since then the heart pounding has resolved.  However, pt is now experiencing the crying spells and sleeplessness again.  She denied feelings of wanting to harm herself or others.    Please advise.  Change in med or appointment?

## 2014-11-25 ENCOUNTER — Ambulatory Visit: Payer: Commercial Managed Care - HMO | Admitting: Family Medicine

## 2014-11-29 ENCOUNTER — Telehealth: Payer: Self-pay | Admitting: Family

## 2014-11-29 ENCOUNTER — Ambulatory Visit (INDEPENDENT_AMBULATORY_CARE_PROVIDER_SITE_OTHER): Payer: Commercial Managed Care - HMO | Admitting: Family Medicine

## 2014-11-29 ENCOUNTER — Telehealth: Payer: Self-pay | Admitting: Family Medicine

## 2014-11-29 ENCOUNTER — Ambulatory Visit (HOSPITAL_BASED_OUTPATIENT_CLINIC_OR_DEPARTMENT_OTHER)
Admission: RE | Admit: 2014-11-29 | Discharge: 2014-11-29 | Disposition: A | Discharge status: Home / Self Care | Payer: Commercial Managed Care - HMO | Source: Ambulatory Visit | Attending: Family Medicine | Admitting: Family Medicine

## 2014-11-29 ENCOUNTER — Encounter: Payer: Self-pay | Admitting: Family Medicine

## 2014-11-29 VITALS — BP 118/68 | HR 58 | Temp 98.5°F | Wt 168.0 lb

## 2014-11-29 DIAGNOSIS — F32A Depression, unspecified: Secondary | ICD-10-CM

## 2014-11-29 DIAGNOSIS — F329 Major depressive disorder, single episode, unspecified: Secondary | ICD-10-CM | POA: Diagnosis not present

## 2014-11-29 DIAGNOSIS — M79661 Pain in right lower leg: Secondary | ICD-10-CM | POA: Diagnosis not present

## 2014-11-29 DIAGNOSIS — M79604 Pain in right leg: Secondary | ICD-10-CM | POA: Insufficient documentation

## 2014-11-29 MED ORDER — FLUOXETINE HCL 20 MG PO TABS: 20.0000 mg | ORAL_TABLET | Freq: Every day | ORAL | Status: DC

## 2014-11-29 NOTE — Progress Notes (Signed)
Subjective:    Patient ID: [name], female    DOB: 1946-08-25, 69 y.o.   MRN: [id number]  HPI  Patient here for f/u depression and she is complaining of calf pain in R calf x 1 day.  Pt has not traveled recently. And is not taking hrt. Pt is overall ok but states she probably would benefit from inc dose prozac.   Past Medical History  Diagnosis Date  . Diabetes mellitus   . Hypertension   . Hyperlipidemia   . Depression   . Restless leg syndrome   . Pancreatitis   . Blockage of coronary artery of heart     Review of Systems  Constitutional: Negative for activity change, appetite change, fatigue and unexpected weight change.  Respiratory: Negative for cough and shortness of breath.   Cardiovascular: Positive for leg swelling. Negative for chest pain and palpitations.  Musculoskeletal:       + R calf pain --- calf to thigh  Psychiatric/Behavioral: Negative for behavioral problems and dysphoric mood. The patient is not nervous/anxious.        Objective:    Physical Exam  Constitutional: She is oriented to person, place, and time. She appears well-developed and well-nourished. No distress.  HENT:  Right Ear: External ear normal.  Left Ear: External ear normal.  Nose: Nose normal.  Mouth/Throat: Oropharynx is clear and moist.  Eyes: EOM are normal. Pupils are equal, round, and reactive to light.  Neck: Normal range of motion. Neck supple.  Cardiovascular: Normal rate, regular rhythm and normal heart sounds.   No murmur heard. Pulmonary/Chest: Effort normal and breath sounds normal. No respiratory distress. She has no wheezes. She has no rales. She exhibits no tenderness.  Musculoskeletal: She exhibits edema and tenderness.  Pain in R calf with palpation--- min swelling Not hot to touch No errythema  Neurological: She is alert and oriented to person, place, and time.  Psychiatric: She has a normal mood and affect. Her behavior is normal. Judgment and thought  content normal.    BP 118/68 mmHg  Pulse 58  Temp(Src) 98.5 F (36.9 C) (Oral)  Wt 168 lb (76.204 kg)  SpO2 95% Wt Readings from Last 3 Encounters:  11/29/14 168 lb (76.204 kg)  09/17/14 167 lb (75.751 kg)  06/22/14 171 lb 4.8 oz (77.7 kg)     Lab Results  Component Value Date   WBC 8.0 10/27/2013   HGB 12.2 10/27/2013   HCT 35.6* 10/27/2013   PLT 177 10/27/2013   GLUCOSE 135* 09/21/2014   CHOL 155 09/21/2014   TRIG 89.0 09/21/2014   HDL 37.80* 09/21/2014   LDLDIRECT 57.2 11/17/2013   LDLCALC 99 09/21/2014   ALT 20 09/21/2014   AST 20 09/21/2014   NA 135 09/21/2014   K 3.7 09/21/2014   CL 104 09/21/2014   CREATININE 0.7 09/21/2014   BUN 16 09/21/2014   CO2 25 09/21/2014   TSH 0.64 09/21/2014   INR 1.1 03/03/2009   HGBA1C 9.3* 09/21/2014   MICROALBUR 14.7* 09/21/2014    Dg Bone Density  12/17/2013   CLINICAL DATA:  Postmenopausal. 69 year old postmenopausal female not currently taking calcium or vitamin-D.  EXAM: DUAL X-RAY ABSORPTIOMETRY (DXA) FOR BONE MINERAL DENSITY  FINDINGS: AP LUMBAR SPINE excluded due to presumed false elevation of bone mineral density due to degenerative change.  Left  FOREARM (1/3 RADIUS)  Bone Mineral Density (BMD):  0.563  Young Adult T Score:  -2.2  Z Score:  -0.3  Left FEMUR  neck  Bone Mineral Density (BMD):  0.563 g/cm2  Young Adult T-Score: -2.6  Z-Score:  -1.4  ASSESSMENT: Patient's diagnostic category is osteoporosis by WHO Criteria.  FRACTURE RISK: Increased  FRAX: World Health Organization FRAX assessment of absolute fracture risk is not calculated for this patient because the patient has osteoporosis.  COMPARISON: None.  Effective therapies are available in the form of bisphosphonates, selective estrogen receptor modulators, biologic agents, and hormone replacement therapy (for women). All patients should ensure an adequate intake of dietary calcium (1200 mg daily) and vitamin D (800 IU daily) unless contraindicated.  All treatment  decisions require clinical judgment and consideration of individual patient factors, including patient preferences, co-morbidities, previous drug use, risk factors not captured in the FRAX model (e.g., frailty, falls, vitamin D deficiency, increased bone turnover, interval significant decline in bone density) and possible under- or over-estimation of fracture risk by FRAX.  The National Osteoporosis Foundation recommends that FDA-approved medical therapies be considered in postmenopausal women and men age 69 or older with a:  1. Hip or vertebral (clinical or morphometric) fracture.  2. T-score of -2.5 or lower at the spine or hip.  3. Ten-year fracture probability by FRAX of 3% or greater for hip fracture or 20% or greater for major osteoporotic fracture.  People with diagnosed cases of osteoporosis or at high risk for fracture should have regular bone mineral density tests. For patients eligible for Medicare, routine testing is allowed once every 2 years. The testing frequency can be increased to one year for patients who have rapidly progressing disease, those who are receiving or discontinuing medical therapy to restore bone mass, or have additional risk factors.  World Science writerHealth Organization [facility](WHO) Criteria:  Normal: T-scores from +1.0 to -1.0  Low Bone Mass (Osteopenia): T-scores between -1.0 and -2.5  Osteoporosis: T-scores -2.5 and below  Comparison to Reference Population:  T-score is the key measure used in the diagnosis of osteoporosis and relative risk determination for fracture. It provides a value for bone mass relative to the mean bone mass of a young adult reference population expressed in terms of standard deviation (SD).  Z-score is the age-matched score showing the patient's values compared to a population matched for age, sex, and race. This is also expressed in terms of standard deviation. The patient may have values that compare favorably to the age-matched values and still be at increased risk for  fracture.   Electronically Signed   By: Christiana PellantGretchen  Green M.D.   On: 12/17/2013 12:04   Mm Digital Screening Bilateral  12/17/2013   CLINICAL DATA:  Screening.  EXAM: DIGITAL SCREENING BILATERAL MAMMOGRAM WITH CAD  COMPARISON:  Previous exam(s).  ACR Breast Density Category c: The breast tissue is heterogeneously dense, which may obscure small masses.  FINDINGS: There are no findings suspicious for malignancy. Images were processed with CAD.  IMPRESSION: No mammographic evidence of malignancy. A result letter of this screening mammogram will be mailed directly to the patient.  RECOMMENDATION: Screening mammogram in one year. (Code:SM-B-01Y)  BI-RADS CATEGORY  1: Negative.   Electronically Signed   By: Sherian ReinWei-Chen  Lin M.D.   On: 12/17/2013 14:38       Assessment & Plan:   Problem List Items Addressed This Visit    Depression - Primary    Improved Increase prozac to 20 mg       Relevant Medications   FLUoxetine (PROZAC) tablet    Other Visit Diagnoses    Calf pain, right  Relevant Orders    [name] Extrem Low Right Comp        Loreen Freud, DO

## 2014-11-29 NOTE — Patient Instructions (Signed)

## 2014-11-29 NOTE — Telephone Encounter (Signed)
[employer] Auth # [id number]

## 2014-11-29 NOTE — Assessment & Plan Note (Signed)
Improved Increase prozac to 20 mg

## 2014-11-29 NOTE — Telephone Encounter (Signed)
RLE neg for doppler- per [place] department call report.

## 2014-11-29 NOTE — Telephone Encounter (Signed)
Caller [facility]: [facility] Relation to pt: Call back number:  [phone] Pharmacy:  Reason for call:   Dorene Grebeatalie says that patient will be [facility] on 2/24 6 month pressure check. There is already a [employer] referral in there but it is for Dr. Elmer [facility]ickerHecker, not Dr. Earlene Plateravis. Can we use same referral?? NPI# [id number] DX:  Unknown??

## 2014-11-29 NOTE — Progress Notes (Signed)
Pre visit review using our clinic review tool, if applicable. No additional management support is needed unless otherwise documented below in the visit note. 

## 2014-12-01 DIAGNOSIS — Z961 Presence of intraocular lens: Secondary | ICD-10-CM | POA: Diagnosis not present

## 2014-12-01 DIAGNOSIS — H524 Presbyopia: Secondary | ICD-10-CM | POA: Diagnosis not present

## 2014-12-01 DIAGNOSIS — H3531 Nonexudative age-related macular degeneration: Secondary | ICD-10-CM | POA: Diagnosis not present

## 2014-12-01 DIAGNOSIS — H40023 Open angle with borderline findings, high risk, bilateral: Secondary | ICD-10-CM | POA: Diagnosis not present

## 2014-12-01 NOTE — Telephone Encounter (Signed)
Notified pt. 

## 2014-12-11 ENCOUNTER — Other Ambulatory Visit: Payer: Self-pay | Admitting: Family Medicine

## 2014-12-14 ENCOUNTER — Telehealth: Payer: Self-pay | Admitting: Family Medicine

## 2014-12-14 DIAGNOSIS — E119 Type 2 diabetes mellitus without complications: Secondary | ICD-10-CM

## 2014-12-14 MED ORDER — GLUCOSE BLOOD VI STRP: ORAL_STRIP | Status: DC

## 2014-12-14 NOTE — Telephone Encounter (Signed)
Caller name: [facility] Relation to pt: Call back number: [phone]:  Reason for call:   Thomasenia SalesLena from [facility] called stating that patient is out of diabetic testing supplies and needs refills asap.

## 2014-12-14 NOTE — Telephone Encounter (Signed)
Confirmed pharmacy with the patient and the Rx has been faxed.     KP

## 2014-12-15 NOTE — Telephone Encounter (Signed)
Pharmacy called and stated they did not receive the fax. Gave verbal ok per notes.

## 2014-12-19 ENCOUNTER — Other Ambulatory Visit: Payer: Self-pay | Admitting: Family Medicine

## 2014-12-31 ENCOUNTER — Emergency Department (HOSPITAL_COMMUNITY)
Admission: EM | Admit: 2014-12-31 | Discharge: 2014-12-31 | Disposition: A | Discharge status: Home / Self Care | Payer: Commercial Managed Care - HMO | Attending: Emergency Medicine | Admitting: Emergency Medicine

## 2014-12-31 ENCOUNTER — Encounter (HOSPITAL_COMMUNITY): Payer: Self-pay

## 2014-12-31 ENCOUNTER — Emergency Department (HOSPITAL_COMMUNITY): Payer: Commercial Managed Care - HMO

## 2014-12-31 DIAGNOSIS — Z79899 Other long term (current) drug therapy: Secondary | ICD-10-CM | POA: Insufficient documentation

## 2014-12-31 DIAGNOSIS — Z7982 Long term (current) use of aspirin: Secondary | ICD-10-CM | POA: Diagnosis not present

## 2014-12-31 DIAGNOSIS — Z794 Long term (current) use of insulin: Secondary | ICD-10-CM | POA: Diagnosis not present

## 2014-12-31 DIAGNOSIS — E785 Hyperlipidemia, unspecified: Secondary | ICD-10-CM | POA: Insufficient documentation

## 2014-12-31 DIAGNOSIS — Z87891 Personal history of nicotine dependence: Secondary | ICD-10-CM | POA: Diagnosis not present

## 2014-12-31 DIAGNOSIS — R110 Nausea: Secondary | ICD-10-CM | POA: Insufficient documentation

## 2014-12-31 DIAGNOSIS — I10 Essential (primary) hypertension: Secondary | ICD-10-CM | POA: Insufficient documentation

## 2014-12-31 DIAGNOSIS — F329 Major depressive disorder, single episode, unspecified: Secondary | ICD-10-CM | POA: Diagnosis not present

## 2014-12-31 DIAGNOSIS — R42 Dizziness and giddiness: Secondary | ICD-10-CM | POA: Diagnosis not present

## 2014-12-31 DIAGNOSIS — Z9861 Coronary angioplasty status: Secondary | ICD-10-CM | POA: Diagnosis not present

## 2014-12-31 DIAGNOSIS — E119 Type 2 diabetes mellitus without complications: Secondary | ICD-10-CM | POA: Diagnosis not present

## 2014-12-31 DIAGNOSIS — Z7983 Long term (current) use of bisphosphonates: Secondary | ICD-10-CM | POA: Diagnosis not present

## 2014-12-31 DIAGNOSIS — Z9851 Tubal ligation status: Secondary | ICD-10-CM | POA: Diagnosis not present

## 2014-12-31 LAB — I-STAT TROPONIN, ED: Troponin i, poc: 0.01 ng/mL (ref 0.00–0.08)

## 2014-12-31 LAB — URINALYSIS, ROUTINE W REFLEX MICROSCOPIC
Bilirubin Urine: NEGATIVE
Glucose, UA: >1000 mg/dL — AB
HGB URINE DIPSTICK: NEGATIVE
KETONES UR: NEGATIVE mg/dL
Leukocytes, UA: NEGATIVE
Nitrite: NEGATIVE
Protein, ur: NEGATIVE mg/dL
Specific Gravity, Urine: 1.021 (ref 1.005–1.030)
UROBILINOGEN UA: 1 mg/dL (ref 0.0–1.0)
pH: 7 (ref 5.0–8.0)

## 2014-12-31 LAB — COMPREHENSIVE METABOLIC PANEL
ALT: 22 U/L (ref 0–35)
AST: 28 U/L (ref 0–37)
Albumin: 4.4 g/dL (ref 3.5–5.2)
Alkaline Phosphatase: 64 U/L (ref 39–117)
Anion gap: 11 (ref 5–15)
BUN: 17 mg/dL (ref 6–23)
CALCIUM: 9.5 mg/dL (ref 8.4–10.5)
CO2: 23 mmol/L (ref 19–32)
Chloride: 102 mmol/L (ref 96–112)
Creatinine, Ser: 0.74 mg/dL (ref 0.50–1.10)
GFR calc Af Amer: >90 mL/min (ref >90)
GFR, EST NON AFRICAN AMERICAN: 85 mL/min — AB (ref >90)
Glucose, Bld: 312 mg/dL — ABNORMAL HIGH (ref 70–99)
Potassium: 3.9 mmol/L (ref 3.5–5.1)
SODIUM: 136 mmol/L (ref 135–145)
Total Bilirubin: 0.8 mg/dL (ref 0.3–1.2)
Total Protein: 7.5 g/dL (ref 6.0–8.3)

## 2014-12-31 LAB — CBC
HCT: 38.8 % (ref 36.0–46.0)
HEMOGLOBIN: 13.1 g/dL (ref 12.0–15.0)
MCH: 30.1 pg (ref 26.0–34.0)
MCHC: 33.8 g/dL (ref 30.0–36.0)
MCV: 89.2 fL (ref 78.0–100.0)
Platelets: 183 10*3/uL (ref 150–400)
RBC: 4.35 MIL/uL (ref 3.87–5.11)
RDW: 12.7 % (ref 11.5–15.5)
WBC: 7.5 10*3/uL (ref 4.0–10.5)

## 2014-12-31 LAB — CBG MONITORING, ED
Glucose-Capillary: 289 mg/dL — ABNORMAL HIGH (ref 70–99)
Glucose-Capillary: 257 mg/dL — ABNORMAL HIGH (ref 70–99)

## 2014-12-31 LAB — URINE MICROSCOPIC-ADD ON

## 2014-12-31 LAB — LIPASE, BLOOD: LIPASE: 44 U/L (ref 11–59)

## 2014-12-31 MED ORDER — ONDANSETRON HCL 4 MG/2ML IJ SOLN
4.0000 mg | Freq: Once | INTRAMUSCULAR | Status: AC
  Administered 2014-12-31: 4 mg via INTRAVENOUS
  Filled 2014-12-31: qty 2

## 2014-12-31 MED ORDER — SODIUM CHLORIDE 0.9 % IV BOLUS (SEPSIS)
1000.0000 mL | Freq: Once | INTRAVENOUS | Status: AC
  Administered 2014-12-31: 1000 mL via INTRAVENOUS

## 2014-12-31 MED ORDER — ONDANSETRON HCL 4 MG PO TABS: 4.0000 mg | ORAL_TABLET | Freq: Four times a day (QID) | ORAL | Status: DC

## 2014-12-31 MED ORDER — MECLIZINE HCL 25 MG PO TABS: 25.0000 mg | ORAL_TABLET | Freq: Three times a day (TID) | ORAL | Status: DC | PRN

## 2014-12-31 MED ORDER — ONDANSETRON 8 MG PO TBDP: 8.0000 mg | ORAL_TABLET | Freq: Once | ORAL | Status: DC

## 2014-12-31 NOTE — ED Notes (Signed)
Pt reports acute onset of nausea and dizziness approx 0200 - pt reports not having and glucose strips x1 week and unsure if she is currently hyperglycemic.

## 2014-12-31 NOTE — ED Provider Notes (Signed)
CSN: [id number]     Arrival date & time 12/31/14  0422 History   First MD Initiated Contact with Patient 12/31/14 [phone]     Chief Complaint  Patient presents with  . Nausea     (Consider location/radiation/quality/duration/timing/severity/associated sxs/prior Treatment) The history is provided by the patient. No language interpreter was used.   [name] is a 69 y.o black female who has a history of DM and HTN who presents with sudden onset dizziness and nausea that began at 2am today and lasted for 2 hours.  She was up watching tv when this occurred and felt the room spinning and heaviness in her head.  Lying flat did not make it better.  She could not check her blood sugar the last few days because she ran out of testing strips.  Her symptoms have since resolved. She denies any fever, loss of consciousness, shortness of breath, chest pain, abdominal pain, dysuria, or leg swelling.  Past Medical History  Diagnosis Date  . Diabetes mellitus   . Hypertension   . Hyperlipidemia   . Depression   . Restless leg syndrome   . Pancreatitis   . Blockage of coronary artery of heart    Past Surgical History  Procedure Laterality Date  . Tubal ligation    . Rotator cuff repair      right  . Coronary angioplasty with stent placement     Family History  Problem Relation Age of Onset  . Lung cancer    . Breast cancer    . Thyroid cancer    . Uterine cancer    . Cancer Other     lung,thyroid,breast,uterine   History  Substance Use Topics  . Smoking status: Former Smoker -- 0.00 packs/day  . Smokeless tobacco: Never Used  . Alcohol Use: No   OB History    No data available     Review of Systems  Constitutional: Negative for fever.  Eyes: Negative for visual disturbance.  Respiratory: Negative for cough.   Cardiovascular: Negative for palpitations and leg swelling.  Gastrointestinal: Negative for vomiting.  Genitourinary: Negative for hematuria.  Neurological: Positive for  light-headedness. Negative for seizures, syncope and weakness.  All other systems reviewed and are negative.     Allergies  Levemir and Metformin and related  Home Medications   Prior to Admission medications   Medication Sig Start Date End Date Taking? Authorizing Provider  alendronate (FOSAMAX) 70 MG tablet Take 1 tablet (70 mg total) by mouth every 7 (seven) days. Take with a full glass of water on an empty stomach. 06/22/14  Yes Rosalita Chessman, DO  ALPRAZolam (XANAX) 0.25 MG tablet Take 1 tablet (0.25 mg total) by mouth 3 (three) times daily as needed for anxiety. 09/17/14  Yes Alferd Apa Lowne, DO  amLODipine (NORVASC) 10 MG tablet take 1 tablet by mouth once daily 10/18/14  Yes Alferd Apa Lowne, DO  aspirin EC 81 MG tablet Take 81 mg by mouth daily.   Yes Historical Provider, MD  atorvastatin (LIPITOR) 40 MG tablet Take 1 tablet (40 mg total) by mouth daily. Repeat labs are due now 12/13/14  Yes Alferd Apa Lowne, DO  fexofenadine (ALLEGRA) 180 MG tablet Take 180 mg by mouth daily. Takes 4 days a week   Yes Historical Provider, MD  Flaxseed, Linseed, (FLAXSEED OIL PO) Take 1 tablet by mouth daily.   Yes Historical Provider, MD  FLUoxetine (PROZAC) 20 MG tablet Take 1 tablet (20 mg total) by mouth daily.  11/29/14  Yes Rosalita Chessman, DO  lisinopril (PRINIVIL,ZESTRIL) 10 MG tablet take 1 tablet by mouth once daily 12/13/14  Yes Alferd Apa Lowne, DO  metoprolol succinate (TOPROL-XL) 50 MG 24 hr tablet take 1 tablet by mouth WITH OR IMMEDIATELY FOLLOWING A MEAL 12/20/14  Yes Rosalita Chessman, DO  Multiple Vitamin (MULTIVITAMIN WITH MINERALS) TABS Take 1 tablet by mouth daily.   Yes Historical Provider, MD  NOVOLIN N RELION 100 UNIT/ML injection Inject 40 Units into the skin at bedtime.  05/05/12  Yes Historical Provider, MD  NOVOLIN R RELION 100 UNIT/ML injection Inject 30 Units into the skin 3 (three) times daily. 05/29/12  Yes Historical Provider, MD  omega-3 acid ethyl esters (LOVAZA) 1 G capsule Take 2  capsules (2 g total) by mouth 2 (two) times daily. Patient taking differently: Take 1 g by mouth daily.  09/17/14  Yes Rosalita Chessman, DO  Blood Glucose Calibration (TAI DOC CONTROL) NORMAL SOLN  10/19/12   Historical Provider, MD  glucose blood (ACCU-CHEK AVIVA) test strip Check blood sugar four times daily. E11.9 12/14/14   Rosalita Chessman, DO  Insulin Pen Needle (PEN NEEDLES 31GX5/16") 31G X 8 MM MISC by Does not apply route daily.      Historical Provider, MD  Insulin Syringe-Needle U-100 (BD INSULIN SYRINGE ULTRAFINE) 31G X 5/16" 0.3 ML MISC 10 Units by Does not apply route 3 (three) times daily before meals.      Historical Provider, MD  Lancet Devices (LANCING DEVICE) MISC  10/19/12   Historical Provider, MD  meclizine (ANTIVERT) 25 MG tablet Take 1 tablet (25 mg total) by mouth 3 (three) times daily as needed for dizziness. 12/31/14   Miyonna Ormiston Patel-Mills, PA-C  ondansetron (ZOFRAN) 4 MG tablet Take 1 tablet (4 mg total) by mouth every 6 (six) hours. 12/31/14   Ottie Glazier, PA-C  PHARMACIST CHOICE LANCETS MISC  10/31/13   Historical Provider, MD   BP 146/66 mmHg  Pulse 56  Temp(Src) 97.4 F (36.3 C) (Oral)  Resp 16  SpO2 96% Physical Exam  Constitutional: She is oriented to person, place, and time. She appears well-developed and well-nourished.  HENT:  Head: Normocephalic and atraumatic.  Mouth/Throat: Uvula is midline and oropharynx is clear and moist.  Eyes: Conjunctivae and EOM are normal. Pupils are equal, round, and reactive to light.  Neck: Normal range of motion. Neck supple.  Cardiovascular: Normal rate, regular rhythm and normal heart sounds.   Pulmonary/Chest: Effort normal and breath sounds normal.  Abdominal: Soft. There is no tenderness.  Musculoskeletal: Normal range of motion.  Neurological: She is alert and oriented to person, place, and time.  Cranial nervesIII-XII intact.  No pronator drift. Good strength in lower and upper extremities bilaterally.   Skin: Skin is  warm and dry.  Nursing note and vitals reviewed.   ED Course  Procedures (including critical care time) Labs Review Labs Reviewed  COMPREHENSIVE METABOLIC PANEL - Abnormal; Notable for the following:    Glucose, Bld 312 (*)    GFR calc non Af Amer 85 (*)    All other components within normal limits  URINALYSIS, ROUTINE W REFLEX MICROSCOPIC - Abnormal; Notable for the following:    APPearance CLOUDY (*)    Glucose, UA >1000 (*)    All other components within normal limits  URINE MICROSCOPIC-ADD ON - Abnormal; Notable for the following:    Squamous Epithelial / LPF FEW (*)    All other components within normal limits  CBG  MONITORING, ED - Abnormal; Notable for the following:    Glucose-Capillary 257 (*)    All other components within normal limits  CBC  LIPASE, BLOOD  I-STAT TROPOININ, ED    Imaging Review Mr Brain Wo Contrast  12/31/2014   CLINICAL DATA:  Sudden onset of dizziness and nausea.  Vertigo.  EXAM: MRI HEAD WITHOUT CONTRAST  TECHNIQUE: Multiplanar, multiecho pulse sequences of the brain and surrounding structures were obtained without intravenous contrast.  COMPARISON:  None.  FINDINGS: The diffusion-weighted images demonstrate no evidence for acute or subacute infarction. No acute hemorrhage or mass lesion is present. The ventricles are of normal size. No significant extra-axial fluid collection is present.  Mild periventricular and scattered subcortical T2 hyperintensities are slightly greater than expected for age.  Flow is present in the major intracranial arteries. The patient is status post bilateral lens replacements. The globes and orbits are otherwise intact. The paranasal sinuses and mastoid air cells are clear.  IMPRESSION: 1. No acute intracranial abnormality. 2. Mild periventricular and subcortical white matter changes slightly exaggerated for age. This likely reflects the sequela of chronic microvascular ischemia.   Electronically Signed   By: San Morelle  M.D.   On: 12/31/2014 09:36     EKG Interpretation None      ED ECG REPORT   Date: 12/31/2014  Rate: 55  Rhythm:Sinus rhythm QRS Axis:120 ms Intervals: PR 180 ms  ST/T Wave abnormalities:  Conduction Disutrbances: Incomplete RBBB and LAFB  Narrative Interpretation:   Old EKG Reviewed:   I have personally reviewed the EKG tracing and agree with the computerized printout as noted.  MDM   Final diagnoses:  Dizziness  Nausea   Patient presents for sudden onset dizziness and nausea that lasted 2 hours.  Her exam is normal. Her symptoms have since resolved.   Her vitals are stable.  Troponin is negative. No UTI.  CBC and lipase is normal. CMP shows glucose of 312.   I spoke to Dr. Ralene Bathe regarding this patient and she ordered an MR Brain w/o contrast.  MR Brain w/o contrast shows no evidence of acute or subacute infarct.  No hemorrhage or mass lesion.  I have given her Antivert and zofran.  I have discussed the results with her and she will f/u with her pcp and agrees with the plan.        Ottie Glazier, PA-C 12/31/14 [place], MD 12/31/14 [phone]

## 2014-12-31 NOTE — Discharge Instructions (Signed)
Dizziness °Dizziness is a common problem. It is a feeling of unsteadiness or light-headedness. You may feel like you are about to faint. Dizziness can lead to injury if you stumble or fall. A person of any age group can suffer from dizziness, but dizziness is more common in older adults. °CAUSES  °Dizziness can be caused by many different things, including: °· Middle ear problems. °· Standing for too long. °· Infections. °· An allergic reaction. °· Aging. °· An emotional response to something, such as the sight of blood. °· Side effects of medicines. °· Tiredness. °· Problems with circulation or blood pressure. °· Excessive use of alcohol or medicines, or illegal drug use. °· Breathing too fast (hyperventilation). °· An irregular heart rhythm (arrhythmia). °· A low red blood cell count (anemia). °· Pregnancy. °· Vomiting, diarrhea, fever, or other illnesses that cause body fluid loss (dehydration). °· Diseases or conditions such as Parkinson's disease, high blood pressure (hypertension), diabetes, and thyroid problems. °· Exposure to extreme heat. °DIAGNOSIS  °Your health care provider will ask about your symptoms, perform a physical exam, and perform an electrocardiogram (ECG) to record the electrical activity of your heart. Your health care provider may also perform other heart or blood tests to determine the cause of your dizziness. These may include: °· Transthoracic echocardiogram (TTE). During echocardiography, sound waves are used to evaluate how blood flows through your heart. °· Transesophageal echocardiogram (TEE). °· Cardiac monitoring. This allows your health care provider to monitor your heart rate and rhythm in real time. °· Holter monitor. This is a portable device that records your heartbeat and can help diagnose heart arrhythmias. It allows your health care provider to track your heart activity for several days if needed. °· Stress tests by exercise or by giving medicine that makes the heart beat  faster. °TREATMENT  °Treatment of dizziness depends on the cause of your symptoms and can vary greatly. °HOME CARE INSTRUCTIONS  °· Drink enough fluids to keep your urine clear or pale yellow. This is especially important in very hot weather. In older adults, it is also important in cold weather. °· Take your medicine exactly as directed if your dizziness is caused by medicines. When taking blood pressure medicines, it is especially important to get up slowly. °¨ Rise slowly from chairs and steady yourself until you feel okay. °¨ In the morning, first sit up on the side of the bed. When you feel okay, stand slowly while holding onto something until you know your balance is fine. °· Move your legs often if you need to stand in one place for a long time. Tighten and relax your muscles in your legs while standing. °· Have someone stay with you for 1-2 days if dizziness continues to be a problem. Do this until you feel you are well enough to stay alone. Have the person call your health care provider if he or she notices changes in you that are concerning. °· Do not drive or use heavy machinery if you feel dizzy. °· Do not drink alcohol. °SEEK IMMEDIATE MEDICAL CARE IF:  °· Your dizziness or light-headedness gets worse. °· You feel nauseous or vomit. °· You have problems talking, walking, or using your arms, hands, or legs. °· You feel weak. °· You are not thinking clearly or you have trouble forming sentences. It may take a friend or family member to notice this. °· You have chest pain, abdominal pain, shortness of breath, or sweating. °· Your vision changes. °· You notice   any bleeding.  You have side effects from medicine that seems to be getting worse rather than better. MAKE SURE YOU:   Understand these instructions.  Will watch your condition.  Will get help right away if you are not doing well or get worse. Document Released: 03/20/2001 Document Revised: 09/29/2013 Document Reviewed: 04/13/2011 [facility]  Patient Information 2015 [employer], [place]. This information is not intended to replace advice given to you by your health care provider. Make sure you discuss any questions you have with your health care provider.  Follow up with your primary care physician.

## 2014-12-31 NOTE — ED Notes (Signed)
Patient transported to MRI 

## 2014-12-31 NOTE — ED Notes (Signed)
Pt's Blood sugar is 289

## 2014-12-31 NOTE — ED Notes (Addendum)
Patient reports sudden onset of dizziness and nausea 2 hours PTA.  Denies pain.  Reports she was feeling hot and sweaty.  She says she thinks her blood sugar is probably high, but she was out of strips so she couldn't check it at home.  CBG: 289

## 2015-01-10 ENCOUNTER — Other Ambulatory Visit: Payer: Self-pay

## 2015-01-11 ENCOUNTER — Ambulatory Visit
Admission: RE | Admit: 2015-01-11 | Discharge: 2015-01-11 | Disposition: A | Discharge status: Home / Self Care | Payer: Commercial Managed Care - HMO | Source: Ambulatory Visit | Attending: Endocrinology | Admitting: Endocrinology

## 2015-01-11 DIAGNOSIS — E049 Nontoxic goiter, unspecified: Secondary | ICD-10-CM

## 2015-01-11 DIAGNOSIS — E042 Nontoxic multinodular goiter: Secondary | ICD-10-CM | POA: Diagnosis not present

## 2015-02-03 NOTE — Telephone Encounter (Signed)
error:[id number] ° °

## 2015-02-09 DIAGNOSIS — E780 Pure hypercholesterolemia: Secondary | ICD-10-CM | POA: Diagnosis not present

## 2015-02-09 DIAGNOSIS — E049 Nontoxic goiter, unspecified: Secondary | ICD-10-CM | POA: Diagnosis not present

## 2015-02-09 DIAGNOSIS — E1165 Type 2 diabetes mellitus with hyperglycemia: Secondary | ICD-10-CM | POA: Diagnosis not present

## 2015-02-16 DIAGNOSIS — E780 Pure hypercholesterolemia: Secondary | ICD-10-CM | POA: Diagnosis not present

## 2015-02-16 DIAGNOSIS — E1121 Type 2 diabetes mellitus with diabetic nephropathy: Secondary | ICD-10-CM | POA: Diagnosis not present

## 2015-02-16 DIAGNOSIS — E1165 Type 2 diabetes mellitus with hyperglycemia: Secondary | ICD-10-CM | POA: Diagnosis not present

## 2015-02-16 DIAGNOSIS — R809 Proteinuria, unspecified: Secondary | ICD-10-CM | POA: Diagnosis not present

## 2015-02-16 DIAGNOSIS — I10 Essential (primary) hypertension: Secondary | ICD-10-CM | POA: Diagnosis not present

## 2015-02-16 DIAGNOSIS — E1140 Type 2 diabetes mellitus with diabetic neuropathy, unspecified: Secondary | ICD-10-CM | POA: Diagnosis not present

## 2015-02-16 DIAGNOSIS — E049 Nontoxic goiter, unspecified: Secondary | ICD-10-CM | POA: Diagnosis not present

## 2015-03-26 ENCOUNTER — Other Ambulatory Visit: Payer: Self-pay | Admitting: Family Medicine

## 2015-06-02 ENCOUNTER — Ambulatory Visit (HOSPITAL_BASED_OUTPATIENT_CLINIC_OR_DEPARTMENT_OTHER)
Admission: RE | Admit: 2015-06-02 | Discharge: 2015-06-02 | Disposition: A | Discharge status: Home / Self Care | Payer: Commercial Managed Care - HMO | Source: Ambulatory Visit | Attending: Family Medicine | Admitting: Family Medicine

## 2015-06-02 ENCOUNTER — Encounter: Payer: Self-pay | Admitting: Family Medicine

## 2015-06-02 ENCOUNTER — Ambulatory Visit (INDEPENDENT_AMBULATORY_CARE_PROVIDER_SITE_OTHER): Payer: Commercial Managed Care - HMO | Admitting: Family Medicine

## 2015-06-02 VITALS — BP 140/76 | HR 78 | Temp 97.7°F | Ht 65.0 in | Wt 168.6 lb

## 2015-06-02 DIAGNOSIS — I10 Essential (primary) hypertension: Secondary | ICD-10-CM

## 2015-06-02 DIAGNOSIS — R0781 Pleurodynia: Secondary | ICD-10-CM | POA: Diagnosis not present

## 2015-06-02 DIAGNOSIS — R1012 Left upper quadrant pain: Secondary | ICD-10-CM | POA: Diagnosis not present

## 2015-06-02 DIAGNOSIS — E119 Type 2 diabetes mellitus without complications: Secondary | ICD-10-CM | POA: Diagnosis not present

## 2015-06-02 DIAGNOSIS — E1169 Type 2 diabetes mellitus with other specified complication: Secondary | ICD-10-CM

## 2015-06-02 DIAGNOSIS — E669 Obesity, unspecified: Secondary | ICD-10-CM

## 2015-06-02 DIAGNOSIS — E042 Nontoxic multinodular goiter: Secondary | ICD-10-CM | POA: Diagnosis not present

## 2015-06-02 DIAGNOSIS — Z0000 Encounter for general adult medical examination without abnormal findings: Secondary | ICD-10-CM

## 2015-06-02 DIAGNOSIS — S299XXA Unspecified injury of thorax, initial encounter: Secondary | ICD-10-CM | POA: Diagnosis not present

## 2015-06-02 LAB — COMPREHENSIVE METABOLIC PANEL
ALBUMIN: 4.4 g/dL (ref 3.5–5.2)
ALK PHOS: 53 U/L (ref 39–117)
ALT: 20 U/L (ref 0–35)
AST: 22 U/L (ref 0–37)
BUN: 19 mg/dL (ref 6–23)
CALCIUM: 9.7 mg/dL (ref 8.4–10.5)
CO2: 25 mEq/L (ref 19–32)
CREATININE: 0.85 mg/dL (ref 0.40–1.20)
Chloride: 104 mEq/L (ref 96–112)
GFR: 85.19 mL/min (ref >60.00)
Glucose, Bld: 111 mg/dL — ABNORMAL HIGH (ref 70–99)
POTASSIUM: 3.3 meq/L — AB (ref 3.5–5.1)
SODIUM: 139 meq/L (ref 135–145)
TOTAL PROTEIN: 7.2 g/dL (ref 6.0–8.3)
Total Bilirubin: 0.5 mg/dL (ref 0.2–1.2)

## 2015-06-02 LAB — CBC WITH DIFFERENTIAL/PLATELET
BASOS PCT: 0.7 % (ref 0.0–3.0)
Basophils Absolute: 0 10*3/uL (ref 0.0–0.1)
EOS PCT: 1.5 % (ref 0.0–5.0)
Eosinophils Absolute: 0.1 10*3/uL (ref 0.0–0.7)
HEMATOCRIT: 37.2 % (ref 36.0–46.0)
HEMOGLOBIN: 12.4 g/dL (ref 12.0–15.0)
LYMPHS PCT: 48.6 % — AB (ref 12.0–46.0)
Lymphs Abs: 2.8 10*3/uL (ref 0.7–4.0)
MCHC: 33.5 g/dL (ref 30.0–36.0)
MCV: 89.1 fl (ref 78.0–100.0)
MONO ABS: 0.4 10*3/uL (ref 0.1–1.0)
Monocytes Relative: 7.5 % (ref 3.0–12.0)
Neutro Abs: 2.4 10*3/uL (ref 1.4–7.7)
Neutrophils Relative %: 41.7 % — ABNORMAL LOW (ref 43.0–77.0)
Platelets: 186 10*3/uL (ref 150.0–400.0)
RBC: 4.17 Mil/uL (ref 3.87–5.11)
RDW: 13.4 % (ref 11.5–15.5)
WBC: 5.8 10*3/uL (ref 4.0–10.5)

## 2015-06-02 LAB — LIPID PANEL
CHOLESTEROL: 134 mg/dL (ref 0–200)
HDL: 34.7 mg/dL — ABNORMAL LOW (ref >39.00)
LDL Cholesterol: 78 mg/dL (ref 0–99)
NONHDL: 99.22
Total CHOL/HDL Ratio: 4
Triglycerides: 105 mg/dL (ref 0.0–149.0)
VLDL: 21 mg/dL (ref 0.0–40.0)

## 2015-06-02 LAB — MICROALBUMIN / CREATININE URINE RATIO
CREATININE, U: 251.3 mg/dL
MICROALB UR: 22.5 mg/dL — AB (ref 0.0–1.9)
MICROALB/CREAT RATIO: 9 mg/g (ref 0.0–30.0)

## 2015-06-02 LAB — HEMOGLOBIN A1C: HEMOGLOBIN A1C: 9.5 % — AB (ref 4.6–6.5)

## 2015-06-02 LAB — HEPATITIS C ANTIBODY: HCV Ab: NEGATIVE

## 2015-06-02 LAB — TSH: TSH: 0.68 u[IU]/mL (ref 0.35–4.50)

## 2015-06-02 MED ORDER — TRAMADOL HCL 50 MG PO TABS: 50.0000 mg | ORAL_TABLET | Freq: Three times a day (TID) | ORAL | Status: DC | PRN

## 2015-06-02 NOTE — Progress Notes (Deleted)
Patient ID: [name], female   DOB: 1946/03/01, 69 y.o.   MRN: [id number]   Subjective:    Patient ID: [name], female    DOB: [birth year], 69 y.o.   MRN: [id number]  Chief Complaint  Patient presents with  . Breast Pain    c/o left breast pain x's 1 week    HPI Patient is in today for ***  Past Medical History  Diagnosis Date  . Diabetes mellitus   . Hypertension   . Hyperlipidemia   . Depression   . Restless leg syndrome   . Pancreatitis   . Blockage of coronary artery of heart     Past Surgical History  Procedure Laterality Date  . Tubal ligation    . Rotator cuff repair      right  . Coronary angioplasty with stent placement      Family History  Problem Relation Age of Onset  . Lung cancer    . Breast cancer    . Thyroid cancer    . Uterine cancer    . Cancer Other     lung,thyroid,breast,uterine    Social History   Social History  . Marital Status: Married    Spouse Name: N/A  . Number of Children: N/A  . Years of Education: N/A   Occupational History  . Not on file.   Social History Main Topics  . Smoking status: Former Smoker -- 0.00 packs/day  . Smokeless tobacco: Never Used  . Alcohol Use: No  . Drug Use: No  . Sexual Activity: Not on file   Other Topics Concern  . Not on file   Social History Narrative   Exercise: seldom   Caffeine use: seldom    Outpatient Prescriptions Prior to Visit  Medication Sig Dispense Refill  . alendronate (FOSAMAX) 70 MG tablet Take 1 tablet (70 mg total) by mouth every 7 (seven) days. Take with a full glass of water on an empty stomach. 4 tablet 11  . ALPRAZolam (XANAX) 0.25 MG tablet Take 1 tablet (0.25 mg total) by mouth 3 (three) times daily as needed for anxiety. 60 tablet 0  . amLODipine (NORVASC) 10 MG tablet take 1 tablet by mouth once daily 90 tablet 1  . aspirin EC 81 MG tablet Take 81 mg by mouth daily.    [name] atorvastatin (LIPITOR) 40 MG tablet take 1 tablet by mouth once daily LABS  ARE DUE NOW 30 tablet 0  . Blood Glucose Calibration (TAI DOC CONTROL) NORMAL SOLN     . fexofenadine (ALLEGRA) 180 MG tablet Take 180 mg by mouth daily. Takes 4 days a week    . Flaxseed, Linseed, (FLAXSEED OIL PO) Take 1 tablet by mouth daily.    [name] FLUoxetine (PROZAC) 20 MG tablet Take 1 tablet (20 mg total) by mouth daily. 30 tablet 3  . glucose blood (ACCU-CHEK AVIVA) test strip Check blood sugar four times daily. E11.9 400 each 3  . Insulin Pen Needle (PEN NEEDLES 31GX5/16") 31G X 8 MM MISC by Does not apply route daily.      . Insulin Syringe-Needle U-100 (BD INSULIN SYRINGE ULTRAFINE) 31G X 5/16" 0.3 ML MISC 10 Units by Does not apply route 3 (three) times daily before meals.      Elmore Guise Devices (LANCING DEVICE) MISC     . lisinopril (PRINIVIL,ZESTRIL) 10 MG tablet take 1 tablet by mouth once daily 90 tablet 3  . meclizine (ANTIVERT) 25 MG tablet Take 1 tablet (25  mg total) by mouth 3 (three) times daily as needed for dizziness. 10 tablet 0  . metoprolol succinate (TOPROL-XL) 50 MG 24 hr tablet take 1 tablet by mouth WITH OR IMMEDIATELY FOLLOWING A MEAL 90 tablet 1  . Multiple Vitamin (MULTIVITAMIN WITH MINERALS) TABS Take 1 tablet by mouth daily.    [name] NOVOLIN N RELION 100 UNIT/ML injection Inject 40 Units into the skin at bedtime.     [name] NOVOLIN R RELION 100 UNIT/ML injection Inject 30 Units into the skin 3 (three) times daily.    [name] omega-3 acid ethyl esters (LOVAZA) 1 G capsule Take 2 capsules (2 g total) by mouth 2 (two) times daily. (Patient taking differently: Take 1 g by mouth daily. ) 120 capsule 5  . ondansetron (ZOFRAN) 4 MG tablet Take 1 tablet (4 mg total) by mouth every 6 (six) hours. 12 tablet 0  . PHARMACIST CHOICE LANCETS MISC      No facility-administered medications prior to visit.    Allergies  Allergen Reactions  . Levemir [Insulin Detemir] Hives and Swelling  . Metformin And Related Other (See Comments)    Sick on stomach, sweaty, throw up, pain    Review of  Systems  Constitutional: Negative for fever and malaise/fatigue.  HENT: Negative for congestion.   Eyes: Negative for discharge.  Respiratory: Negative for shortness of breath.   Cardiovascular: Negative for chest pain, palpitations and leg swelling.  Gastrointestinal: Positive for abdominal pain. Negative for nausea.  Genitourinary: Negative for dysuria.  Musculoskeletal: Negative for falls.  Skin: Negative for rash.  Neurological: Negative for loss of consciousness and headaches.  Endo/Heme/Allergies: Negative for environmental allergies.  Psychiatric/Behavioral: Negative for depression. The patient is not nervous/anxious.        Objective:    Physical Exam  BP 140/76 mmHg  Pulse 78  Temp(Src) 97.7 F (36.5 C) (Oral)  Ht 5' 5" (1.651 m)  Wt 168 lb 9.6 oz (76.476 kg)  BMI 28.06 kg/m2  SpO2 99% Wt Readings from Last 3 Encounters:  06/02/15 168 lb 9.6 oz (76.476 kg)  11/29/14 168 lb (76.204 kg)  09/17/14 167 lb (75.751 kg)     Lab Results  Component Value Date   WBC 7.5 12/31/2014   HGB 13.1 12/31/2014   HCT 38.8 12/31/2014   PLT 183 12/31/2014   GLUCOSE 312* 12/31/2014   CHOL 155 09/21/2014   TRIG 89.0 09/21/2014   HDL 37.80* 09/21/2014   LDLDIRECT 57.2 11/17/2013   LDLCALC 99 09/21/2014   ALT 22 12/31/2014   AST 28 12/31/2014   NA 136 12/31/2014   K 3.9 12/31/2014   CL 102 12/31/2014   CREATININE 0.74 12/31/2014   BUN 17 12/31/2014   CO2 23 12/31/2014   TSH 0.64 09/21/2014   INR 1.1 03/03/2009   HGBA1C 9.3* 09/21/2014   MICROALBUR 14.7* 09/21/2014    Lab Results  Component Value Date   TSH 0.64 09/21/2014   Lab Results  Component Value Date   WBC 7.5 12/31/2014   HGB 13.1 12/31/2014   HCT 38.8 12/31/2014   MCV 89.2 12/31/2014   PLT 183 12/31/2014   Lab Results  Component Value Date   NA 136 12/31/2014   K 3.9 12/31/2014   CO2 23 12/31/2014   GLUCOSE 312* 12/31/2014   BUN 17 12/31/2014   CREATININE 0.74 12/31/2014   BILITOT 0.8  12/31/2014   ALKPHOS 64 12/31/2014   AST 28 12/31/2014   ALT 22 12/31/2014   PROT 7.5 12/31/2014  ALBUMIN 4.4 12/31/2014   CALCIUM 9.5 12/31/2014   ANIONGAP 11 12/31/2014   GFR 103.38 09/21/2014   Lab Results  Component Value Date   CHOL 155 09/21/2014   Lab Results  Component Value Date   HDL 37.80* 09/21/2014   Lab Results  Component Value Date   LDLCALC 99 09/21/2014   Lab Results  Component Value Date   TRIG 89.0 09/21/2014   Lab Results  Component Value Date   CHOLHDL 4 09/21/2014   Lab Results  Component Value Date   HGBA1C 9.3* 09/21/2014       Assessment & Plan:   Problem List Items Addressed This Visit    None      I am having Ms. Sturgeon maintain her Insulin Syringe-Needle U-100, PEN NEEDLES 31GX5/16", NOVOLIN R RELION, NOVOLIN N RELION, multivitamin with minerals, fexofenadine, Lancing Device, TAI DOC CONTROL, aspirin EC, PHARMACIST CHOICE LANCETS, alendronate, omega-3 acid ethyl esters, ALPRAZolam, amLODipine, FLUoxetine, lisinopril, glucose blood, metoprolol succinate, (Flaxseed, Linseed, (FLAXSEED OIL PO)), meclizine, ondansetron, and atorvastatin.  No orders of the defined types were placed in this encounter.     Elizabeth Sauer, LPN

## 2015-06-02 NOTE — Progress Notes (Signed)
Pre visit review using our clinic review tool, if applicable. No additional management support is needed unless otherwise documented below in the visit note. 

## 2015-06-03 ENCOUNTER — Ambulatory Visit (HOSPITAL_BASED_OUTPATIENT_CLINIC_OR_DEPARTMENT_OTHER)
Admission: RE | Admit: 2015-06-03 | Discharge: 2015-06-03 | Disposition: A | Discharge status: Home / Self Care | Payer: Commercial Managed Care - HMO | Source: Ambulatory Visit | Attending: Family Medicine | Admitting: Family Medicine

## 2015-06-03 DIAGNOSIS — N281 Cyst of kidney, acquired: Secondary | ICD-10-CM | POA: Diagnosis not present

## 2015-06-03 DIAGNOSIS — K824 Cholesterolosis of gallbladder: Secondary | ICD-10-CM | POA: Insufficient documentation

## 2015-06-03 DIAGNOSIS — R1012 Left upper quadrant pain: Secondary | ICD-10-CM | POA: Diagnosis not present

## 2015-06-05 NOTE — Progress Notes (Signed)
Patient ID: [name], female    DOB: 1946/04/05  Age: 69 y.o. MRN: [id number]    Subjective:  Subjective HPI REASE SWINSON presents for LUQ pain.  No NVD, no fever, chills. She is also her f/u dm, htn, hyperlipidemia.   Review of Systems  Constitutional: Negative for fever, chills, diaphoresis, appetite change, fatigue and unexpected weight change.  HENT: Positive for sinus pressure. Negative for congestion, postnasal drip and rhinorrhea.   Eyes: Negative for pain, redness and visual disturbance.  Respiratory: Negative for cough, chest tightness, shortness of breath and wheezing.   Cardiovascular: Negative for chest pain, palpitations and leg swelling.  Endocrine: Negative for cold intolerance, heat intolerance, polydipsia, polyphagia and polyuria.  Genitourinary: Negative for dysuria, frequency and difficulty urinating.  Allergic/Immunologic: Negative for environmental allergies.  Neurological: Negative for dizziness, light-headedness, numbness and headaches.    History Past Medical History  Diagnosis Date  . Diabetes mellitus   . Hypertension   . Hyperlipidemia   . Depression   . Restless leg syndrome   . Pancreatitis   . Blockage of coronary artery of heart     She has past surgical history that includes Tubal ligation; Rotator cuff repair; and Coronary angioplasty with stent.   Her family history includes Breast cancer in an other family member; Cancer in her other; Lung cancer in an other family member; Thyroid cancer in an other family member; Uterine cancer in an other family member.She reports that she has quit smoking. She has never used smokeless tobacco. She reports that she does not drink alcohol or use illicit drugs.  Current Outpatient Prescriptions on File Prior to Visit  Medication Sig Dispense Refill  . alendronate (FOSAMAX) 70 MG tablet Take 1 tablet (70 mg total) by mouth every 7 (seven) days. Take with a full glass of water on an empty stomach. 4  tablet 11  . ALPRAZolam (XANAX) 0.25 MG tablet Take 1 tablet (0.25 mg total) by mouth 3 (three) times daily as needed for anxiety. 60 tablet 0  . amLODipine (NORVASC) 10 MG tablet take 1 tablet by mouth once daily 90 tablet 1  . aspirin EC 81 MG tablet Take 81 mg by mouth daily.    [name] atorvastatin (LIPITOR) 40 MG tablet take 1 tablet by mouth once daily LABS ARE DUE NOW 30 tablet 0  . Blood Glucose Calibration (TAI DOC CONTROL) NORMAL SOLN     . fexofenadine (ALLEGRA) 180 MG tablet Take 180 mg by mouth daily. Takes 4 days a week    . Flaxseed, Linseed, (FLAXSEED OIL PO) Take 1 tablet by mouth daily.    [name] FLUoxetine (PROZAC) 20 MG tablet Take 1 tablet (20 mg total) by mouth daily. 30 tablet 3  . glucose blood (ACCU-CHEK AVIVA) test strip Check blood sugar four times daily. E11.9 400 each 3  . Insulin Pen Needle (PEN NEEDLES 31GX5/16") 31G X 8 MM MISC by Does not apply route daily.      . Insulin Syringe-Needle U-100 (BD INSULIN SYRINGE ULTRAFINE) 31G X 5/16" 0.3 ML MISC 10 Units by Does not apply route 3 (three) times daily before meals.      Elmore Guise Devices (LANCING DEVICE) MISC     . lisinopril (PRINIVIL,ZESTRIL) 10 MG tablet take 1 tablet by mouth once daily 90 tablet 3  . meclizine (ANTIVERT) 25 MG tablet Take 1 tablet (25 mg total) by mouth 3 (three) times daily as needed for dizziness. 10 tablet 0  . metoprolol succinate (TOPROL-XL) 50  MG 24 hr tablet take 1 tablet by mouth WITH OR IMMEDIATELY FOLLOWING A MEAL 90 tablet 1  . Multiple Vitamin (MULTIVITAMIN WITH MINERALS) TABS Take 1 tablet by mouth daily.    [name] NOVOLIN N RELION 100 UNIT/ML injection Inject 40 Units into the skin at bedtime.     [name] NOVOLIN R RELION 100 UNIT/ML injection Inject 30 Units into the skin 3 (three) times daily.    [name] omega-3 acid ethyl esters (LOVAZA) 1 G capsule Take 2 capsules (2 g total) by mouth 2 (two) times daily. (Patient taking differently: Take 1 g by mouth daily. ) 120 capsule 5  . ondansetron (ZOFRAN) 4 MG  tablet Take 1 tablet (4 mg total) by mouth every 6 (six) hours. 12 tablet 0  . PHARMACIST CHOICE LANCETS MISC      No current facility-administered medications on file prior to visit.     Objective:  Objective Physical Exam  Constitutional: She is oriented to person, place, and time. She appears well-developed and well-nourished.  HENT:  Head: Normocephalic and atraumatic.  Eyes: Conjunctivae and EOM are normal.  Neck: Normal range of motion. Neck supple. No JVD present. Carotid bruit is not present. No thyromegaly present.  Cardiovascular: Normal rate, regular rhythm and normal heart sounds.   No murmur heard. Pulmonary/Chest: Effort normal and breath sounds normal. No respiratory distress. She has no wheezes. She has no rales. She exhibits no tenderness.  Abdominal: Soft. Bowel sounds are normal. She exhibits no distension and no mass. There is tenderness in the left upper quadrant. There is no rebound and no guarding.    Musculoskeletal: She exhibits no edema.  Neurological: She is alert and oriented to person, place, and time.  Psychiatric: She has a normal mood and affect. Her behavior is normal.   BP 140/76 mmHg  Pulse 78  Temp(Src) 97.7 F (36.5 C) (Oral)  Ht '5\' 5"'  (1.651 m)  Wt 168 lb 9.6 oz (76.476 kg)  BMI 28.06 kg/m2  SpO2 99% Wt Readings from Last 3 Encounters:  06/02/15 168 lb 9.6 oz (76.476 kg)  11/29/14 168 lb (76.204 kg)  09/17/14 167 lb (75.751 kg)     Lab Results  Component Value Date   WBC 5.8 06/02/2015   HGB 12.4 06/02/2015   HCT 37.2 06/02/2015   PLT 186.0 06/02/2015   GLUCOSE 111* 06/02/2015   CHOL 134 06/02/2015   TRIG 105.0 06/02/2015   HDL 34.70* 06/02/2015   LDLDIRECT 57.2 11/17/2013   LDLCALC 78 06/02/2015   ALT 20 06/02/2015   AST 22 06/02/2015   NA 139 06/02/2015   K 3.3* 06/02/2015   CL 104 06/02/2015   CREATININE 0.85 06/02/2015   BUN 19 06/02/2015   CO2 25 06/02/2015   TSH 0.68 06/02/2015   INR 1.1 03/03/2009   HGBA1C 9.5*  06/02/2015   MICROALBUR 22.5* 06/02/2015    US Abdomen Complete  06/03/2015   CLINICAL DATA:  Left upper quadrant abdomen pain  EXAM: ULTRASOUND ABDOMEN COMPLETE  COMPARISON:  October 23, 2012  FINDINGS: Gallbladder: No gallstones or wall thickening visualized. No sonographic Murphy sign noted. There are least 2 small polyps in the anterior wall of the gallbladder, largest measuring 2 mm. These are probably unchanged from prior ultrasound.  Common bile duct: Diameter: 6 mm  Liver: No focal lesion identified. Within normal limits in parenchymal echogenicity.  IVC: Not well seen.  Pancreas: Visualized portion unremarkable.  Spleen: Size and appearance within normal limits.  Right Kidney: Length: 13 cm.  Echogenicity within normal limits. There is a 2 x 2 x 1.8 cm cyst in the upper pole right kidney with a thin septation. This is probably unchanged compared to prior ultrasound. No hydronephrosis visualized.  Left Kidney: Length: 12.7 cm. Echogenicity within normal limits. No mass or hydronephrosis visualized. Parapelvic renal cyst is noted.  Abdominal aorta: No aneurysm visualized.  Other findings: None.  IMPRESSION: No acute abnormality.  Small gallbladder polyps unchanged compared to prior exam.  Right kidney cyst probably unchanged compared to prior exam.   Electronically Signed   By: Abelardo Diesel M.D.   On: 06/03/2015 10:21     Assessment & Plan:  Plan I am having Ms. Rabine start on traMADol. I am also having her maintain her Insulin Syringe-Needle U-100, PEN NEEDLES 31GX5/16", NOVOLIN R RELION, NOVOLIN N RELION, multivitamin with minerals, fexofenadine, Lancing Device, TAI DOC CONTROL, aspirin EC, PHARMACIST CHOICE LANCETS, alendronate, omega-3 acid ethyl esters, ALPRAZolam, amLODipine, FLUoxetine, lisinopril, glucose blood, metoprolol succinate, (Flaxseed, Linseed, (FLAXSEED OIL PO)), meclizine, ondansetron, and atorvastatin.  Meds ordered this encounter  Medications  . traMADol (ULTRAM) 50 MG  tablet    Sig: Take 1 tablet (50 mg total) by mouth every 8 (eight) hours as needed.    Dispense:  30 tablet    Refill:  0    Problem List Items Addressed This Visit    Multinodular goiter   Relevant Orders   TSH (Completed)   Essential hypertension - Primary   Relevant Orders   CBC with Differential/Platelet (Completed)   Comprehensive metabolic panel (Completed)   Lipid panel (Completed)   TSH (Completed)   Microalbumin / creatinine urine ratio (Completed)    Other Visit Diagnoses    LUQ pain        Relevant Orders    DG Ribs Unilateral Left (Completed)    US Abdomen Complete (Completed)    Diabetes mellitus type 2 in obese        Relevant Orders    Hemoglobin A1c (Completed)    POCT urinalysis dipstick    Preventative health care        Relevant Orders    CBC with Differential/Platelet (Completed)    Comprehensive metabolic panel (Completed)    Hemoglobin A1c (Completed)    Lipid panel (Completed)    TSH (Completed)    POCT urinalysis dipstick    Hepatitis C antibody (Completed)       Follow-up: No Follow-up on file.  Garnet Koyanagi, DO

## 2015-06-06 NOTE — Patient Instructions (Signed)

## 2015-06-07 ENCOUNTER — Telehealth: Payer: Self-pay | Admitting: Family Medicine

## 2015-06-07 NOTE — Telephone Encounter (Signed)
Pt tried to return to work today but they said she had to have a note from doctor to return. She wants to pick up this evening so she can go back tomorrow. Please call [phone].

## 2015-06-07 NOTE — Telephone Encounter (Signed)
VM left advising letter ready for pick up.      KP 

## 2015-06-07 NOTE — Telephone Encounter (Signed)
Ok to give note 

## 2015-06-07 NOTE — Telephone Encounter (Signed)
Please advise      KP 

## 2015-06-19 ENCOUNTER — Emergency Department (HOSPITAL_COMMUNITY): Payer: Commercial Managed Care - HMO

## 2015-06-19 ENCOUNTER — Encounter (HOSPITAL_COMMUNITY): Payer: Self-pay | Admitting: Emergency Medicine

## 2015-06-19 ENCOUNTER — Emergency Department (HOSPITAL_COMMUNITY)
Admission: EM | Admit: 2015-06-19 | Discharge: 2015-06-19 | Disposition: A | Discharge status: Home / Self Care | Payer: Commercial Managed Care - HMO | Attending: Emergency Medicine | Admitting: Emergency Medicine

## 2015-06-19 DIAGNOSIS — Z8719 Personal history of other diseases of the digestive system: Secondary | ICD-10-CM | POA: Diagnosis not present

## 2015-06-19 DIAGNOSIS — F329 Major depressive disorder, single episode, unspecified: Secondary | ICD-10-CM | POA: Insufficient documentation

## 2015-06-19 DIAGNOSIS — Z79899 Other long term (current) drug therapy: Secondary | ICD-10-CM | POA: Diagnosis not present

## 2015-06-19 DIAGNOSIS — G2581 Restless legs syndrome: Secondary | ICD-10-CM | POA: Diagnosis not present

## 2015-06-19 DIAGNOSIS — Z87891 Personal history of nicotine dependence: Secondary | ICD-10-CM | POA: Insufficient documentation

## 2015-06-19 DIAGNOSIS — M1711 Unilateral primary osteoarthritis, right knee: Secondary | ICD-10-CM

## 2015-06-19 DIAGNOSIS — M7989 Other specified soft tissue disorders: Secondary | ICD-10-CM | POA: Diagnosis not present

## 2015-06-19 DIAGNOSIS — E785 Hyperlipidemia, unspecified: Secondary | ICD-10-CM | POA: Insufficient documentation

## 2015-06-19 DIAGNOSIS — M25561 Pain in right knee: Secondary | ICD-10-CM | POA: Diagnosis present

## 2015-06-19 DIAGNOSIS — E119 Type 2 diabetes mellitus without complications: Secondary | ICD-10-CM | POA: Insufficient documentation

## 2015-06-19 DIAGNOSIS — Z794 Long term (current) use of insulin: Secondary | ICD-10-CM | POA: Diagnosis not present

## 2015-06-19 DIAGNOSIS — Z7982 Long term (current) use of aspirin: Secondary | ICD-10-CM | POA: Diagnosis not present

## 2015-06-19 DIAGNOSIS — I10 Essential (primary) hypertension: Secondary | ICD-10-CM | POA: Insufficient documentation

## 2015-06-19 MED ORDER — HYDROCODONE-ACETAMINOPHEN 5-325 MG PO TABS: 1.0000 | ORAL_TABLET | Freq: Four times a day (QID) | ORAL | Status: DC | PRN

## 2015-06-19 NOTE — ED Provider Notes (Signed)
CSN: [id number]     Arrival date & time 06/19/15  1117 History  This chart was scribed for non-physician practitioner Jeannett Senior, PA-C working with Forde Dandy, MD by Meriel Pica, ED Scribe. This patient was seen in room WTR6/WTR6 and the patient's care was started at 12:37 PM.   Chief Complaint  Patient presents with  . Knee Injury   The history is provided by the patient. No language interpreter was used.   HPI Comments: [name] is a 69 y.o. female, with a PMhx of DM, HTN, and HLD, who presents to the Emergency Department complaining of sudden onset, constant right knee pain and swelling onset 1 day ago. Pt reports the swelling is worse in the morning. She states on Friday, 2 days ago, she was doing a lot of walking. Her pain is exacerbated with weight bearing. Pt has taken ibuprofen with mild relief of right knee pain. She denies an injury attributable to the right knee pain, past pain to right knee, pain or swelling in left knee, fevers or chills.  PCP: Garnet Koyanagi, MD  Past Medical History  Diagnosis Date  . Diabetes mellitus   . Hypertension   . Hyperlipidemia   . Depression   . Restless leg syndrome   . Pancreatitis   . Blockage of coronary artery of heart    Past Surgical History  Procedure Laterality Date  . Tubal ligation    . Rotator cuff repair      right  . Coronary angioplasty with stent placement     Family History  Problem Relation Age of Onset  . Lung cancer    . Breast cancer    . Thyroid cancer    . Uterine cancer    . Cancer Other     lung,thyroid,breast,uterine   Social History  Substance Use Topics  . Smoking status: Former Smoker -- 0.00 packs/day  . Smokeless tobacco: Never Used  . Alcohol Use: No   OB History    No data available     Review of Systems  Constitutional: Negative for fever and chills.  Musculoskeletal: Positive for joint swelling ( right knee) and arthralgias ( right knee).   Allergies  Levemir and  Metformin and related  Home Medications   Prior to Admission medications   Medication Sig Start Date End Date Taking? Authorizing Provider  alendronate (FOSAMAX) 70 MG tablet Take 1 tablet (70 mg total) by mouth every 7 (seven) days. Take with a full glass of water on an empty stomach. 06/22/14   Rosalita Chessman, DO  ALPRAZolam (XANAX) 0.25 MG tablet Take 1 tablet (0.25 mg total) by mouth 3 (three) times daily as needed for anxiety. 09/17/14   Rosalita Chessman, DO  amLODipine (NORVASC) 10 MG tablet take 1 tablet by mouth once daily 10/18/14   Rosalita Chessman, DO  aspirin EC 81 MG tablet Take 81 mg by mouth daily.    Historical Provider, MD  atorvastatin (LIPITOR) 40 MG tablet take 1 tablet by mouth once daily LABS ARE DUE NOW 03/28/15   Rosalita Chessman, DO  Blood Glucose Calibration (TAI [facility] CONTROL) NORMAL SOLN  10/19/12   Historical Provider, MD  fexofenadine (ALLEGRA) 180 MG tablet Take 180 mg by mouth daily. Takes 4 days a week    Historical Provider, MD  Flaxseed, Linseed, (FLAXSEED OIL PO) Take 1 tablet by mouth daily.    Historical Provider, MD  FLUoxetine (PROZAC) 20 MG tablet Take 1 tablet (20 mg  total) by mouth daily. 11/29/14   Rosalita Chessman, DO  glucose blood (ACCU-CHEK AVIVA) test strip Check blood sugar four times daily. E11.9 12/14/14   Rosalita Chessman, DO  Insulin Pen Needle (PEN NEEDLES 31GX5/16") 31G X 8 MM MISC by Does not apply route daily.      Historical Provider, MD  Insulin Syringe-Needle U-100 (BD INSULIN SYRINGE ULTRAFINE) 31G X 5/16" 0.3 ML MISC 10 Units by Does not apply route 3 (three) times daily before meals.      Historical Provider, MD  Lancet Devices (LANCING DEVICE) MISC  10/19/12   Historical Provider, MD  lisinopril (PRINIVIL,ZESTRIL) 10 MG tablet take 1 tablet by mouth once daily 12/13/14   Rosalita Chessman, DO  meclizine (ANTIVERT) 25 MG tablet Take 1 tablet (25 mg total) by mouth 3 (three) times daily as needed for dizziness. 12/31/14   Hanna Patel-Mills, PA-C  metoprolol  succinate (TOPROL-XL) 50 MG 24 hr tablet take 1 tablet by mouth WITH OR IMMEDIATELY FOLLOWING A MEAL 12/20/14   Rosalita Chessman, DO  Multiple Vitamin (MULTIVITAMIN WITH MINERALS) TABS Take 1 tablet by mouth daily.    Historical Provider, MD  NOVOLIN N RELION 100 UNIT/ML injection Inject 40 Units into the skin at bedtime.  05/05/12   Historical Provider, MD  NOVOLIN R RELION 100 UNIT/ML injection Inject 30 Units into the skin 3 (three) times daily. 05/29/12   Historical Provider, MD  omega-3 acid ethyl esters (LOVAZA) 1 G capsule Take 2 capsules (2 g total) by mouth 2 (two) times daily. Patient taking differently: Take 1 g by mouth daily.  09/17/14   Alferd Apa Lowne, DO  ondansetron (ZOFRAN) 4 MG tablet Take 1 tablet (4 mg total) by mouth every 6 (six) hours. 12/31/14   Ottie Glazier, PA-C  PHARMACIST CHOICE LANCETS MISC  10/31/13   Historical Provider, MD  traMADol (ULTRAM) 50 MG tablet Take 1 tablet (50 mg total) by mouth every 8 (eight) hours as needed. 06/02/15   Alferd Apa Lowne, DO   BP 117/57 mmHg  Pulse 58  Temp(Src) 97.7 F (36.5 C) (Oral)  Resp 20  SpO2 99% Physical Exam  Constitutional: She appears well-developed and well-nourished. No distress.  HENT:  Head: Normocephalic.  Eyes: Conjunctivae are normal.  Neck: No JVD present.  Pulmonary/Chest: Effort normal. No respiratory distress.  Musculoskeletal: She exhibits edema.  Right DP pulse intact; ankle is normal. Right knee; swelling noted. Tender to palpation over medial joint. Full ROM of the knee joint. Pain with full flexion and extension. Negative anterior and posterior drawer signs. No laxity or pain with medial or lateral stress.    Neurological: She is alert. Coordination normal.  Skin: Skin is warm. No rash noted. No erythema. No pallor.  Psychiatric: She has a normal mood and affect. Her behavior is normal.  Nursing note and vitals reviewed.  ED Course  Procedures  DIAGNOSTIC STUDIES: Oxygen Saturation is 99% on RA,  normal by my interpretation.    COORDINATION OF CARE: 12:43 PM Discussed treatment plan with pt. Discussed Xray results with pt. Advised pt to refrain from prolonged walking or strenuous activity, and ice and elevate right knee. Also advised to follow up with PCP if symptoms persist. Will give referral to orthopedist. Pt acknowledges and agrees to plan.   Imaging Review Dg Knee Complete 4 Views Right  06/19/2015   CLINICAL DATA:  Right knee pain and swelling for 2 days  EXAM: RIGHT KNEE - COMPLETE 4+ VIEW  COMPARISON:  05/27/2008  FINDINGS: Minimal tricompartmental degenerative change. No fracture or dislocation. Tibial spine spurring is identified. Trace suprapatellar fluid. Vascular calcifications.  IMPRESSION: Minimal tricompartmental degenerative change without focal acute finding.   Electronically Signed   By: Conchita Paris M.D.   On: 06/19/2015 12:20   I have personally reviewed and evaluated these images as part of my medical decision-making.   MDM   Final diagnoses:  Primary osteoarthritis of right knee    Patient with nontraumatic pain to the right knee. Swelling noted. No evidence of infection. Knee is not red, not warm to touch. Full range of motion of the knee. Neurovascularly intact. X-ray showing osteoarthritis. Home with NSAIDs, and Norco for severe pain, follow-up primary care doctor. Knee sleeve provider for swelling. Ice and elevation at home. No further indication of imaging and treatment in emergency department.  Filed Vitals:   06/19/15 1126  BP: 117/57  Pulse: 58  Temp: 97.7 F (36.5 C)  TempSrc: Oral  Resp: 20  SpO2: 99%   I personally performed the services described in this documentation, which was scribed in my presence. The recorded information has been reviewed and is accurate.   Jeannett Senior, PA-C 06/19/15 2002  Lacretia Leigh, MD 06/19/15 [phone]

## 2015-06-19 NOTE — ED Notes (Signed)
Patient transported to X-ray 

## 2015-06-19 NOTE — ED Notes (Signed)
Pt has rt knee pain, denies any injury since last night. Minimal swelling. Pain with wt bearing. Took motrin last night with minimal relief. Strong pulses.

## 2015-06-19 NOTE — Discharge Instructions (Signed)
Ice and elevate right knee. Knee sleeve for compression and support. norco for severe pain only. Avoid strenuous activity for the next week. If pain continues, follow up with primary care doctor.   Osteoarthritis Osteoarthritis is a disease that causes soreness and inflammation of a joint. It occurs when the cartilage at the affected joint wears down. Cartilage acts as a cushion, covering the ends of bones where they meet to form a joint. Osteoarthritis is the most common form of arthritis. It often occurs in older people. The joints affected most often by this condition include those in the:  Ends of the fingers.  Thumbs.  Neck.  Lower back.  Knees.  Hips. CAUSES  Over time, the cartilage that covers the ends of bones begins to wear away. This causes bone to rub on bone, producing pain and stiffness in the affected joints.  RISK FACTORS Certain factors can increase your chances of having osteoarthritis, including:  Older age.  Excessive body weight.  Overuse of joints.  Previous joint injury. SIGNS AND SYMPTOMS   Pain, swelling, and stiffness in the joint.  Over time, the joint may lose its normal shape.  Small deposits of bone (osteophytes) may grow on the edges of the joint.  Bits of bone or cartilage can break off and float inside the joint space. This may cause more pain and damage. DIAGNOSIS  Your health care provider will do a physical exam and ask about your symptoms. Various tests may be ordered, such as:  X-rays of the affected joint.  An MRI scan.  Blood tests to rule out other types of arthritis.  Joint fluid tests. This involves using a needle to draw fluid from the joint and examining the fluid under a microscope. TREATMENT  Goals of treatment are to control pain and improve joint function. Treatment plans may include:  A prescribed exercise program that allows for rest and joint relief.  A weight control plan.  Pain relief techniques, such  as:  Properly applied heat and cold.  Electric pulses delivered to nerve endings under the skin (transcutaneous electrical nerve stimulation [TENS]).  Massage.  Certain nutritional supplements.  Medicines to control pain, such as:  Acetaminophen.  Nonsteroidal anti-inflammatory drugs (NSAIDs), such as naproxen.  Narcotic or central-acting agents, such as tramadol.  Corticosteroids. These can be given orally or as an injection.  Surgery to reposition the bones and relieve pain (osteotomy) or to remove loose pieces of bone and cartilage. Joint replacement may be needed in advanced states of osteoarthritis. HOME CARE INSTRUCTIONS   Take medicines only as directed by your health care provider.  Maintain a healthy weight. Follow your health care provider's instructions for weight control. This may include dietary instructions.  Exercise as directed. Your health care provider can recommend specific types of exercise. These may include:  Strengthening exercises. These are done to strengthen the muscles that support joints affected by arthritis. They can be performed with weights or with exercise bands to add resistance.  Aerobic activities. These are exercises, such as brisk walking or low-impact aerobics, that get your heart pumping.  Range-of-motion activities. These keep your joints limber.  Balance and agility exercises. These help you maintain daily living skills.  Rest your affected joints as directed by your health care provider.  Keep all follow-up visits as directed by your health care provider. SEEK MEDICAL CARE IF:   Your skin turns red.  You develop a rash in addition to your joint pain.  You have worsening joint  pain.  You have a fever along with joint or muscle aches. SEEK IMMEDIATE MEDICAL CARE IF:  You have a significant loss of weight or appetite.  You have night sweats. FOR MORE INFORMATION   National Institute of Arthritis and Musculoskeletal and  Skin Diseases: www.niams.http://www.myers.net/  [employer] on Aging: https://walker.com/  American College of Rheumatology: www.rheumatology.org Document Released: 09/24/2005 Document Revised: 02/08/2014 Document Reviewed: 06/01/2013 [facility] Patient Information 2015 [place], [place]. This information is not intended to replace advice given to you by your health care provider. Make sure you discuss any questions you have with your health care provider.  Knee Pain The knee is the complex joint between your thigh and your lower leg. It is made up of bones, tendons, ligaments, and cartilage. The bones that make up the knee are:  The femur in the thigh.  The tibia and fibula in the lower leg.  The patella or kneecap riding in the groove on the lower femur. CAUSES  Knee pain is a common complaint with many causes. A few of these causes are:  Injury, such as:  A ruptured ligament or tendon injury.  Torn cartilage.  Medical conditions, such as:  Gout  Arthritis  Infections  Overuse, over training, or overdoing a physical activity. Knee pain can be minor or severe. Knee pain can accompany debilitating injury. Minor knee problems often respond well to self-care measures or get well on their own. More serious injuries may need medical intervention or even surgery. SYMPTOMS The knee is complex. Symptoms of knee problems can vary widely. Some of the problems are:  Pain with movement and weight bearing.  Swelling and tenderness.  Buckling of the knee.  Inability to straighten or extend your knee.  Your knee locks and you cannot straighten it.  Warmth and redness with pain and fever.  Deformity or dislocation of the kneecap. DIAGNOSIS  Determining what is wrong may be very straight forward such as when there is an injury. It can also be challenging because of the complexity of the knee. Tests to make a diagnosis may include:  Your caregiver taking a history and doing a physical  exam.  Routine X-rays can be used to rule out other problems. X-rays will not reveal a cartilage tear. Some injuries of the knee can be diagnosed by:  Arthroscopy a surgical technique by which a small video camera is inserted through tiny incisions on the sides of the knee. This procedure is used to examine and repair internal knee joint problems. Tiny instruments can be used during arthroscopy to repair the torn knee cartilage (meniscus).  Arthrography is a radiology technique. A contrast liquid is directly injected into the knee joint. Internal structures of the knee joint then become visible on X-ray film.  An MRI scan is a non X-ray radiology procedure in which magnetic fields and a computer produce two- or three-dimensional images of the inside of the knee. Cartilage tears are often visible using an MRI scanner. MRI scans have largely replaced arthrography in diagnosing cartilage tears of the knee.  Blood work.  Examination of the fluid that helps to lubricate the knee joint (synovial fluid). This is done by taking a sample out using a needle and a syringe. TREATMENT The treatment of knee problems depends on the cause. Some of these treatments are:  Depending on the injury, proper casting, splinting, surgery, or physical therapy care will be needed.  Give yourself adequate recovery time. Do not overuse your joints. If you begin to get  sore during workout routines, back off. Slow down or do fewer repetitions.  For repetitive activities such as cycling or running, maintain your strength and nutrition.  Alternate muscle groups. For example, if you are a weight lifter, work the upper body on one day and the lower body the next.  Either tight or weak muscles do not give the proper support for your knee. Tight or weak muscles do not absorb the stress placed on the knee joint. Keep the muscles surrounding the knee strong.  Take care of mechanical problems.  If you have flat feet, orthotics  or special shoes may help. See your caregiver if you need help.  Arch supports, sometimes with wedges on the inner or outer aspect of the heel, can help. These can shift pressure away from the side of the knee most bothered by osteoarthritis.  A brace called an "unloader" brace also may be used to help ease the pressure on the most arthritic side of the knee.  If your caregiver has prescribed crutches, braces, wraps or ice, use as directed. The acronym for this is PRICE. This means protection, rest, ice, compression, and elevation.  Nonsteroidal anti-inflammatory drugs (NSAIDs), can help relieve pain. But if taken immediately after an injury, they may actually increase swelling. Take NSAIDs with food in your stomach. Stop them if you develop stomach problems. Do not take these if you have a history of ulcers, stomach pain, or bleeding from the bowel. Do not take without your caregiver's approval if you have problems with fluid retention, heart failure, or kidney problems.  For ongoing knee problems, physical therapy may be helpful.  Glucosamine and chondroitin are over-the-counter dietary supplements. Both may help relieve the pain of osteoarthritis in the knee. These medicines are different from the usual anti-inflammatory drugs. Glucosamine may decrease the rate of cartilage destruction.  Injections of a corticosteroid drug into your knee joint may help reduce the symptoms of an arthritis flare-up. They may provide pain relief that lasts a few months. You may have to wait a few months between injections. The injections do have a small increased risk of infection, water retention, and elevated blood sugar levels.  Hyaluronic acid injected into damaged joints may ease pain and provide lubrication. These injections may work by reducing inflammation. A series of shots may give relief for as long as 6 months.  Topical painkillers. Applying certain ointments to your skin may help relieve the pain and  stiffness of osteoarthritis. Ask your pharmacist for suggestions. Many over the-counter products are approved for temporary relief of arthritis pain.  In some countries, doctors often prescribe topical NSAIDs for relief of chronic conditions such as arthritis and tendinitis. A review of treatment with NSAID creams found that they worked as well as oral medications but without the serious side effects. PREVENTION  Maintain a healthy weight. Extra pounds put more strain on your joints.  Get strong, stay limber. Weak muscles are a common cause of knee injuries. Stretching is important. Include flexibility exercises in your workouts.  Be smart about exercise. If you have osteoarthritis, chronic knee pain or recurring injuries, you may need to change the way you exercise. This does not mean you have to stop being active. If your knees ache after jogging or playing basketball, consider switching to swimming, water aerobics, or other low-impact activities, at least for a few days a week. Sometimes limiting high-impact activities will provide relief.  Make sure your shoes fit well. Choose footwear that is right for your  sport.  Protect your knees. Use the proper gear for knee-sensitive activities. Use kneepads when playing volleyball or laying carpet. Buckle your seat belt every time you drive. Most shattered kneecaps occur in car accidents.  Rest when you are tired. SEEK MEDICAL CARE IF:  You have knee pain that is continual and does not seem to be getting better.  SEEK IMMEDIATE MEDICAL CARE IF:  Your knee joint feels hot to the touch and you have a high fever. MAKE SURE YOU:   Understand these instructions.  Will watch your condition.  Will get help right away if you are not doing well or get worse. Document Released: 07/22/2007 Document Revised: 12/17/2011 Document Reviewed: 07/22/2007 [facility] Patient Information 2015 [place], [place]. This information is not intended to replace advice given  to you by your health care provider. Make sure you discuss any questions you have with your health care provider.  Knee Pain The knee is the complex joint between your thigh and your lower leg. It is made up of bones, tendons, ligaments, and cartilage. The bones that make up the knee are:  The femur in the thigh.  The tibia and fibula in the lower leg.  The patella or kneecap riding in the groove on the lower femur. CAUSES  Knee pain is a common complaint with many causes. A few of these causes are:  Injury, such as:  A ruptured ligament or tendon injury.  Torn cartilage.  Medical conditions, such as:  Gout  Arthritis  Infections  Overuse, over training, or overdoing a physical activity. Knee pain can be minor or severe. Knee pain can accompany debilitating injury. Minor knee problems often respond well to self-care measures or get well on their own. More serious injuries may need medical intervention or even surgery. SYMPTOMS The knee is complex. Symptoms of knee problems can vary widely. Some of the problems are:  Pain with movement and weight bearing.  Swelling and tenderness.  Buckling of the knee.  Inability to straighten or extend your knee.  Your knee locks and you cannot straighten it.  Warmth and redness with pain and fever.  Deformity or dislocation of the kneecap. DIAGNOSIS  Determining what is wrong may be very straight forward such as when there is an injury. It can also be challenging because of the complexity of the knee. Tests to make a diagnosis may include:  Your caregiver taking a history and doing a physical exam.  Routine X-rays can be used to rule out other problems. X-rays will not reveal a cartilage tear. Some injuries of the knee can be diagnosed by:  Arthroscopy a surgical technique by which a small video camera is inserted through tiny incisions on the sides of the knee. This procedure is used to examine and repair internal knee joint  problems. Tiny instruments can be used during arthroscopy to repair the torn knee cartilage (meniscus).  Arthrography is a radiology technique. A contrast liquid is directly injected into the knee joint. Internal structures of the knee joint then become visible on X-ray film.  An MRI scan is a non X-ray radiology procedure in which magnetic fields and a computer produce two- or three-dimensional images of the inside of the knee. Cartilage tears are often visible using an MRI scanner. MRI scans have largely replaced arthrography in diagnosing cartilage tears of the knee.  Blood work.  Examination of the fluid that helps to lubricate the knee joint (synovial fluid). This is done by taking a sample out using a  needle and a syringe. TREATMENT The treatment of knee problems depends on the cause. Some of these treatments are:  Depending on the injury, proper casting, splinting, surgery, or physical therapy care will be needed.  Give yourself adequate recovery time. Do not overuse your joints. If you begin to get sore during workout routines, back off. Slow down or do fewer repetitions.  For repetitive activities such as cycling or running, maintain your strength and nutrition.  Alternate muscle groups. For example, if you are a weight lifter, work the upper body on one day and the lower body the next.  Either tight or weak muscles do not give the proper support for your knee. Tight or weak muscles do not absorb the stress placed on the knee joint. Keep the muscles surrounding the knee strong.  Take care of mechanical problems.  If you have flat feet, orthotics or special shoes may help. See your caregiver if you need help.  Arch supports, sometimes with wedges on the inner or outer aspect of the heel, can help. These can shift pressure away from the side of the knee most bothered by osteoarthritis.  A brace called an "unloader" brace also may be used to help ease the pressure on the most  arthritic side of the knee.  If your caregiver has prescribed crutches, braces, wraps or ice, use as directed. The acronym for this is PRICE. This means protection, rest, ice, compression, and elevation.  Nonsteroidal anti-inflammatory drugs (NSAIDs), can help relieve pain. But if taken immediately after an injury, they may actually increase swelling. Take NSAIDs with food in your stomach. Stop them if you develop stomach problems. Do not take these if you have a history of ulcers, stomach pain, or bleeding from the bowel. Do not take without your caregiver's approval if you have problems with fluid retention, heart failure, or kidney problems.  For ongoing knee problems, physical therapy may be helpful.  Glucosamine and chondroitin are over-the-counter dietary supplements. Both may help relieve the pain of osteoarthritis in the knee. These medicines are different from the usual anti-inflammatory drugs. Glucosamine may decrease the rate of cartilage destruction.  Injections of a corticosteroid drug into your knee joint may help reduce the symptoms of an arthritis flare-up. They may provide pain relief that lasts a few months. You may have to wait a few months between injections. The injections do have a small increased risk of infection, water retention, and elevated blood sugar levels.  Hyaluronic acid injected into damaged joints may ease pain and provide lubrication. These injections may work by reducing inflammation. A series of shots may give relief for as long as 6 months.  Topical painkillers. Applying certain ointments to your skin may help relieve the pain and stiffness of osteoarthritis. Ask your pharmacist for suggestions. Many over the-counter products are approved for temporary relief of arthritis pain.  In some countries, doctors often prescribe topical NSAIDs for relief of chronic conditions such as arthritis and tendinitis. A review of treatment with NSAID creams found that they  worked as well as oral medications but without the serious side effects. PREVENTION  Maintain a healthy weight. Extra pounds put more strain on your joints.  Get strong, stay limber. Weak muscles are a common cause of knee injuries. Stretching is important. Include flexibility exercises in your workouts.  Be smart about exercise. If you have osteoarthritis, chronic knee pain or recurring injuries, you may need to change the way you exercise. This does not mean you have to  stop being active. If your knees ache after jogging or playing basketball, consider switching to swimming, water aerobics, or other low-impact activities, at least for a few days a week. Sometimes limiting high-impact activities will provide relief.  Make sure your shoes fit well. Choose footwear that is right for your sport.  Protect your knees. Use the proper gear for knee-sensitive activities. Use kneepads when playing volleyball or laying carpet. Buckle your seat belt every time you drive. Most shattered kneecaps occur in car accidents.  Rest when you are tired. SEEK MEDICAL CARE IF:  You have knee pain that is continual and does not seem to be getting better.  SEEK IMMEDIATE MEDICAL CARE IF:  Your knee joint feels hot to the touch and you have a high fever. MAKE SURE YOU:   Understand these instructions.  Will watch your condition.  Will get help right away if you are not doing well or get worse. Document Released: 07/22/2007 Document Revised: 12/17/2011 Document Reviewed: 07/22/2007 [facility] Patient Information 2015 [place], [place]. This information is not intended to replace advice given to you by your health care provider. Make sure you discuss any questions you have with your health care provider.

## 2015-06-21 DIAGNOSIS — R809 Proteinuria, unspecified: Secondary | ICD-10-CM | POA: Diagnosis not present

## 2015-06-21 DIAGNOSIS — I10 Essential (primary) hypertension: Secondary | ICD-10-CM | POA: Diagnosis not present

## 2015-06-21 DIAGNOSIS — E049 Nontoxic goiter, unspecified: Secondary | ICD-10-CM | POA: Diagnosis not present

## 2015-06-21 DIAGNOSIS — E780 Pure hypercholesterolemia: Secondary | ICD-10-CM | POA: Diagnosis not present

## 2015-06-21 DIAGNOSIS — E1121 Type 2 diabetes mellitus with diabetic nephropathy: Secondary | ICD-10-CM | POA: Diagnosis not present

## 2015-06-21 DIAGNOSIS — E1165 Type 2 diabetes mellitus with hyperglycemia: Secondary | ICD-10-CM | POA: Diagnosis not present

## 2015-06-21 DIAGNOSIS — E1140 Type 2 diabetes mellitus with diabetic neuropathy, unspecified: Secondary | ICD-10-CM | POA: Diagnosis not present

## 2015-06-23 ENCOUNTER — Encounter: Payer: Self-pay | Admitting: Family Medicine

## 2015-06-23 ENCOUNTER — Ambulatory Visit (INDEPENDENT_AMBULATORY_CARE_PROVIDER_SITE_OTHER): Payer: Commercial Managed Care - HMO | Admitting: Family Medicine

## 2015-06-23 ENCOUNTER — Telehealth: Payer: Self-pay | Admitting: Family Medicine

## 2015-06-23 VITALS — BP 122/62 | HR 58 | Temp 98.0°F | Resp 18 | Ht 65.0 in | Wt 173.0 lb

## 2015-06-23 DIAGNOSIS — E119 Type 2 diabetes mellitus without complications: Secondary | ICD-10-CM | POA: Diagnosis not present

## 2015-06-23 DIAGNOSIS — M25561 Pain in right knee: Secondary | ICD-10-CM | POA: Diagnosis not present

## 2015-06-23 DIAGNOSIS — E042 Nontoxic multinodular goiter: Secondary | ICD-10-CM | POA: Diagnosis not present

## 2015-06-23 DIAGNOSIS — E669 Obesity, unspecified: Secondary | ICD-10-CM | POA: Diagnosis not present

## 2015-06-23 DIAGNOSIS — Z0000 Encounter for general adult medical examination without abnormal findings: Secondary | ICD-10-CM | POA: Diagnosis not present

## 2015-06-23 DIAGNOSIS — I10 Essential (primary) hypertension: Secondary | ICD-10-CM | POA: Diagnosis not present

## 2015-06-23 DIAGNOSIS — R1012 Left upper quadrant pain: Secondary | ICD-10-CM | POA: Diagnosis not present

## 2015-06-23 MED ORDER — HYDROCODONE-ACETAMINOPHEN 5-325 MG PO TABS: 1.0000 | ORAL_TABLET | Freq: Four times a day (QID) | ORAL | Status: DC | PRN

## 2015-06-23 NOTE — Addendum Note (Signed)
Addended by: Eustace Quail on: 06/23/2015 10:21 AM   Modules accepted: Orders

## 2015-06-23 NOTE — Progress Notes (Deleted)
Patient ID: [name], female   DOB: 12/17/1945, 69 y.o.   MRN: [id number]   Subjective:    Patient ID: [name], female    DOB: July 08, 1946, 69 y.o.   MRN: [id number]  No chief complaint on file.   HPI Patient is in today for ***  Past Medical History  Diagnosis Date  . Diabetes mellitus   . Hypertension   . Hyperlipidemia   . Depression   . Restless leg syndrome   . Pancreatitis   . Blockage of coronary artery of heart     Past Surgical History  Procedure Laterality Date  . Tubal ligation    . Rotator cuff repair      right  . Coronary angioplasty with stent placement      Family History  Problem Relation Age of Onset  . Lung cancer    . Breast cancer    . Thyroid cancer    . Uterine cancer    . Cancer Other     lung,thyroid,breast,uterine    Social History   Social History  . Marital Status: Married    Spouse Name: N/A  . Number of Children: N/A  . Years of Education: N/A   Occupational History  . Not on file.   Social History Main Topics  . Smoking status: Former Smoker -- 0.00 packs/day  . Smokeless tobacco: Never Used  . Alcohol Use: No  . Drug Use: No  . Sexual Activity: Not on file   Other Topics Concern  . Not on file   Social History Narrative   Exercise: seldom   Caffeine use: seldom    Outpatient Prescriptions Prior to Visit  Medication Sig Dispense Refill  . alendronate (FOSAMAX) 70 MG tablet Take 1 tablet (70 mg total) by mouth every 7 (seven) days. Take with a full glass of water on an empty stomach. 4 tablet 11  . ALPRAZolam (XANAX) 0.25 MG tablet Take 1 tablet (0.25 mg total) by mouth 3 (three) times daily as needed for anxiety. 60 tablet 0  . amLODipine (NORVASC) 10 MG tablet take 1 tablet by mouth once daily 90 tablet 1  . aspirin EC 81 MG tablet Take 81 mg by mouth daily.    [name] atorvastatin (LIPITOR) 40 MG tablet take 1 tablet by mouth once daily LABS ARE DUE NOW 30 tablet 0  . Blood Glucose Calibration (TAI DOC  CONTROL) NORMAL SOLN     . fexofenadine (ALLEGRA) 180 MG tablet Take 180 mg by mouth daily. Takes 4 days a week    . Flaxseed, Linseed, (FLAXSEED OIL PO) Take 1 tablet by mouth daily.    [name] FLUoxetine (PROZAC) 20 MG tablet Take 1 tablet (20 mg total) by mouth daily. 30 tablet 3  . glucose blood (ACCU-CHEK AVIVA) test strip Check blood sugar four times daily. E11.9 400 each 3  . HYDROcodone-acetaminophen (NORCO) 5-325 MG per tablet Take 1 tablet by mouth every 6 (six) hours as needed for moderate pain. 20 tablet 0  . Insulin Pen Needle (PEN NEEDLES 31GX5/16") 31G X 8 MM MISC by Does not apply route daily.      . Insulin Syringe-Needle U-100 (BD INSULIN SYRINGE ULTRAFINE) 31G X 5/16" 0.3 ML MISC 10 Units by Does not apply route 3 (three) times daily before meals.      Elmore Guise Devices (LANCING DEVICE) MISC     . lisinopril (PRINIVIL,ZESTRIL) 10 MG tablet take 1 tablet by mouth once daily 90 tablet 3  .  meclizine (ANTIVERT) 25 MG tablet Take 1 tablet (25 mg total) by mouth 3 (three) times daily as needed for dizziness. 10 tablet 0  . metoprolol succinate (TOPROL-XL) 50 MG 24 hr tablet take 1 tablet by mouth WITH OR IMMEDIATELY FOLLOWING A MEAL 90 tablet 1  . Multiple Vitamin (MULTIVITAMIN WITH MINERALS) TABS Take 1 tablet by mouth daily.    [name] NOVOLIN N RELION 100 UNIT/ML injection Inject 40 Units into the skin at bedtime.     [name] NOVOLIN R RELION 100 UNIT/ML injection Inject 30 Units into the skin 3 (three) times daily.    [name] omega-3 acid ethyl esters (LOVAZA) 1 G capsule Take 2 capsules (2 g total) by mouth 2 (two) times daily. (Patient taking differently: Take 1 g by mouth daily. ) 120 capsule 5  . ondansetron (ZOFRAN) 4 MG tablet Take 1 tablet (4 mg total) by mouth every 6 (six) hours. 12 tablet 0  . PHARMACIST CHOICE LANCETS MISC     . traMADol (ULTRAM) 50 MG tablet Take 1 tablet (50 mg total) by mouth every 8 (eight) hours as needed. 30 tablet 0   No facility-administered medications prior to  visit.    Allergies  Allergen Reactions  . Levemir [Insulin Detemir] Hives and Swelling  . Metformin And Related Other (See Comments)    Sick on stomach, sweaty, throw up, pain    Review of Systems  Constitutional: Negative for fever and malaise/fatigue.  HENT: Negative for congestion.   Eyes: Negative for discharge.  Respiratory: Negative for shortness of breath.   Cardiovascular: Negative for chest pain, palpitations and leg swelling.  Gastrointestinal: Negative for nausea and abdominal pain.  Genitourinary: Negative for dysuria.  Musculoskeletal: Negative for falls.  Skin: Negative for rash.  Neurological: Negative for loss of consciousness and headaches.  Endo/Heme/Allergies: Negative for environmental allergies.  Psychiatric/Behavioral: Negative for depression. The patient is not nervous/anxious.        Objective:    Physical Exam  There were no vitals taken for this visit. Wt Readings from Last 3 Encounters:  06/02/15 168 lb 9.6 oz (76.476 kg)  11/29/14 168 lb (76.204 kg)  09/17/14 167 lb (75.751 kg)     Lab Results  Component Value Date   WBC 5.8 06/02/2015   HGB 12.4 06/02/2015   HCT 37.2 06/02/2015   PLT 186.0 06/02/2015   GLUCOSE 111* 06/02/2015   CHOL 134 06/02/2015   TRIG 105.0 06/02/2015   HDL 34.70* 06/02/2015   LDLDIRECT 57.2 11/17/2013   LDLCALC 78 06/02/2015   ALT 20 06/02/2015   AST 22 06/02/2015   NA 139 06/02/2015   K 3.3* 06/02/2015   CL 104 06/02/2015   CREATININE 0.85 06/02/2015   BUN 19 06/02/2015   CO2 25 06/02/2015   TSH 0.68 06/02/2015   INR 1.1 03/03/2009   HGBA1C 9.5* 06/02/2015   MICROALBUR 22.5* 06/02/2015    Lab Results  Component Value Date   TSH 0.68 06/02/2015   Lab Results  Component Value Date   WBC 5.8 06/02/2015   HGB 12.4 06/02/2015   HCT 37.2 06/02/2015   MCV 89.1 06/02/2015   PLT 186.0 06/02/2015   Lab Results  Component Value Date   NA 139 06/02/2015   K 3.3* 06/02/2015   CO2 25 06/02/2015    GLUCOSE 111* 06/02/2015   BUN 19 06/02/2015   CREATININE 0.85 06/02/2015   BILITOT 0.5 06/02/2015   ALKPHOS 53 06/02/2015   AST 22 06/02/2015   ALT 20 06/02/2015  PROT 7.2 06/02/2015   ALBUMIN 4.4 06/02/2015   CALCIUM 9.7 06/02/2015   ANIONGAP 11 12/31/2014   GFR 85.19 06/02/2015   Lab Results  Component Value Date   CHOL 134 06/02/2015   Lab Results  Component Value Date   HDL 34.70* 06/02/2015   Lab Results  Component Value Date   LDLCALC 78 06/02/2015   Lab Results  Component Value Date   TRIG 105.0 06/02/2015   Lab Results  Component Value Date   CHOLHDL 4 06/02/2015   Lab Results  Component Value Date   HGBA1C 9.5* 06/02/2015       Assessment & Plan:   Problem List Items Addressed This Visit    None      I am having Ms. Sawyers maintain her Insulin Syringe-Needle U-100, PEN NEEDLES 31GX5/16", NOVOLIN R RELION, NOVOLIN N RELION, multivitamin with minerals, fexofenadine, Lancing Device, TAI DOC CONTROL, aspirin EC, PHARMACIST CHOICE LANCETS, alendronate, omega-3 acid ethyl esters, ALPRAZolam, amLODipine, FLUoxetine, lisinopril, glucose blood, metoprolol succinate, (Flaxseed, Linseed, (FLAXSEED OIL PO)), meclizine, ondansetron, atorvastatin, traMADol, and HYDROcodone-acetaminophen.  No orders of the defined types were placed in this encounter.     Elizabeth Sauer, LPN

## 2015-06-23 NOTE — Progress Notes (Signed)
Patient ID: [name], female    DOB: 1946/06/01  Age: 69 y.o. MRN: [id number]    Subjective:  Subjective HPI [name] presents for f/u ER for R knee pain.  Pt states she woke up with the pain Sat am.  No known injury.  + swelling.  No fever/ chills.     Review of Systems  Constitutional: Negative for diaphoresis, appetite change, fatigue and unexpected weight change.  Eyes: Negative for pain, redness and visual disturbance.  Respiratory: Negative for cough, chest tightness, shortness of breath and wheezing.   Cardiovascular: Negative for chest pain, palpitations and leg swelling.  Endocrine: Negative for cold intolerance, heat intolerance, polydipsia, polyphagia and polyuria.  Genitourinary: Negative for dysuria, frequency and difficulty urinating.  Musculoskeletal: Positive for joint swelling and arthralgias.  Neurological: Negative for dizziness, light-headedness, numbness and headaches.    History Past Medical History  Diagnosis Date  . Diabetes mellitus   . Hypertension   . Hyperlipidemia   . Depression   . Restless leg syndrome   . Pancreatitis   . Blockage of coronary artery of heart     She has past surgical history that includes Tubal ligation; Rotator cuff repair; and Coronary angioplasty with stent.   Her family history includes Breast cancer in an other family member; Cancer in her other; Lung cancer in an other family member; Thyroid cancer in an other family member; Uterine cancer in an other family member.She reports that she has quit smoking. She has never used smokeless tobacco. She reports that she does not drink alcohol or use illicit drugs.  Current Outpatient Prescriptions on File Prior to Visit  Medication Sig Dispense Refill  . alendronate (FOSAMAX) 70 MG tablet Take 1 tablet (70 mg total) by mouth every 7 (seven) days. Take with a full glass of water on an empty stomach. 4 tablet 11  . ALPRAZolam (XANAX) 0.25 MG tablet Take 1 tablet (0.25  mg total) by mouth 3 (three) times daily as needed for anxiety. 60 tablet 0  . amLODipine (NORVASC) 10 MG tablet take 1 tablet by mouth once daily 90 tablet 1  . aspirin EC 81 MG tablet Take 81 mg by mouth daily.    [name] atorvastatin (LIPITOR) 40 MG tablet take 1 tablet by mouth once daily LABS ARE DUE NOW 30 tablet 0  . Blood Glucose Calibration (TAI DOC CONTROL) NORMAL SOLN     . fexofenadine (ALLEGRA) 180 MG tablet Take 180 mg by mouth daily. Takes 4 days a week    . Flaxseed, Linseed, (FLAXSEED OIL PO) Take 1 tablet by mouth daily.    [name] FLUoxetine (PROZAC) 20 MG tablet Take 1 tablet (20 mg total) by mouth daily. 30 tablet 3  . glucose blood (ACCU-CHEK AVIVA) test strip Check blood sugar four times daily. E11.9 400 each 3  . Insulin Pen Needle (PEN NEEDLES 31GX5/16") 31G X 8 MM MISC by Does not apply route daily.      . Insulin Syringe-Needle U-100 (BD INSULIN SYRINGE ULTRAFINE) 31G X 5/16" 0.3 ML MISC 10 Units by Does not apply route 3 (three) times daily before meals.      Elmore Guise Devices (LANCING DEVICE) MISC     . lisinopril (PRINIVIL,ZESTRIL) 10 MG tablet take 1 tablet by mouth once daily 90 tablet 3  . meclizine (ANTIVERT) 25 MG tablet Take 1 tablet (25 mg total) by mouth 3 (three) times daily as needed for dizziness. 10 tablet 0  . metoprolol succinate (TOPROL-XL) 50  MG 24 hr tablet take 1 tablet by mouth WITH OR IMMEDIATELY FOLLOWING A MEAL 90 tablet 1  . Multiple Vitamin (MULTIVITAMIN WITH MINERALS) TABS Take 1 tablet by mouth daily.    [name] NOVOLIN N RELION 100 UNIT/ML injection Inject 40 Units into the skin at bedtime.     [name] NOVOLIN R RELION 100 UNIT/ML injection Inject 30 Units into the skin 3 (three) times daily.    [name] omega-3 acid ethyl esters (LOVAZA) 1 G capsule Take 2 capsules (2 g total) by mouth 2 (two) times daily. (Patient taking differently: Take 1 g by mouth daily. ) 120 capsule 5  . ondansetron (ZOFRAN) 4 MG tablet Take 1 tablet (4 mg total) by mouth every 6 (six) hours. 12  tablet 0  . PHARMACIST CHOICE LANCETS MISC     . traMADol (ULTRAM) 50 MG tablet Take 1 tablet (50 mg total) by mouth every 8 (eight) hours as needed. 30 tablet 0   No current facility-administered medications on file prior to visit.     Objective:  Objective Physical Exam  Constitutional: She is oriented to person, place, and time. She appears well-developed and well-nourished.  HENT:  Head: Normocephalic and atraumatic.  Eyes: Conjunctivae and EOM are normal.  Neck: Normal range of motion. Neck supple. No JVD present. Carotid bruit is not present. No thyromegaly present.  Cardiovascular: Normal rate, regular rhythm and normal heart sounds.   No murmur heard. Pulmonary/Chest: Effort normal and breath sounds normal. No respiratory distress. She has no wheezes. She has no rales. She exhibits no tenderness.  Musculoskeletal: She exhibits edema and tenderness.       Right knee: She exhibits swelling and effusion. She exhibits normal range of motion, no ecchymosis, no deformity, no laceration and no erythema. Tenderness found. Medial joint line tenderness noted.  Neurological: She is alert and oriented to person, place, and time.  Psychiatric: She has a normal mood and affect.  Nursing note and vitals reviewed.  BP 122/62 mmHg  Pulse 58  Temp(Src) 98 F (36.7 C) (Oral)  Resp 18  Ht '5\' 5"'  (1.651 m)  Wt 173 lb (78.472 kg)  BMI 28.79 kg/m2  SpO2 98% Wt Readings from Last 3 Encounters:  06/23/15 173 lb (78.472 kg)  06/02/15 168 lb 9.6 oz (76.476 kg)  11/29/14 168 lb (76.204 kg)     Lab Results  Component Value Date   WBC 5.8 06/02/2015   HGB 12.4 06/02/2015   HCT 37.2 06/02/2015   PLT 186.0 06/02/2015   GLUCOSE 111* 06/02/2015   CHOL 134 06/02/2015   TRIG 105.0 06/02/2015   HDL 34.70* 06/02/2015   LDLDIRECT 57.2 11/17/2013   LDLCALC 78 06/02/2015   ALT 20 06/02/2015   AST 22 06/02/2015   NA 139 06/02/2015   K 3.3* 06/02/2015   CL 104 06/02/2015   CREATININE 0.85  06/02/2015   BUN 19 06/02/2015   CO2 25 06/02/2015   TSH 0.68 06/02/2015   INR 1.1 03/03/2009   HGBA1C 9.5* 06/02/2015   MICROALBUR 22.5* 06/02/2015    Dg Knee Complete 4 Views Right  06/19/2015   CLINICAL DATA:  Right knee pain and swelling for 2 days  EXAM: RIGHT KNEE - COMPLETE 4+ VIEW  COMPARISON:  05/27/2008  FINDINGS: Minimal tricompartmental degenerative change. No fracture or dislocation. Tibial spine spurring is identified. Trace suprapatellar fluid. Vascular calcifications.  IMPRESSION: Minimal tricompartmental degenerative change without focal acute finding.   Electronically Signed   By: Roena Malady.D.  On: 06/19/2015 12:20     Assessment & Plan:  Plan I am having Ms. Wren maintain her Insulin Syringe-Needle U-100, PEN NEEDLES 31GX5/16", NOVOLIN R RELION, NOVOLIN N RELION, multivitamin with minerals, fexofenadine, Lancing Device, TAI DOC CONTROL, aspirin EC, PHARMACIST CHOICE LANCETS, alendronate, omega-3 acid ethyl esters, ALPRAZolam, amLODipine, FLUoxetine, lisinopril, glucose blood, metoprolol succinate, (Flaxseed, Linseed, (FLAXSEED OIL PO)), meclizine, ondansetron, atorvastatin, traMADol, gabapentin, and HYDROcodone-acetaminophen.  Meds ordered this encounter  Medications  . gabapentin (NEURONTIN) 100 MG capsule    Sig: Take 100 mg by mouth.    Refill:  0  . HYDROcodone-acetaminophen (NORCO) 5-325 MG per tablet    Sig: Take 1 tablet by mouth every 6 (six) hours as needed for moderate pain.    Dispense:  30 tablet    Refill:  0    Problem List Items Addressed This Visit    None    Visit Diagnoses    Right knee pain    -  Primary    Relevant Medications    HYDROcodone-acetaminophen (NORCO) 5-325 MG per tablet     pt sent to [facility] tonight secondary to her pain level Pain med refilled con't with knee sleeve for now  Follow-up: Return if symptoms worsen or fail to improve.  Garnet Koyanagi, DO

## 2015-06-23 NOTE — Patient Instructions (Signed)
Knee Pain °Knee pain can be a result of an injury or other medical conditions. Treatment will depend on the cause of your pain. °HOME CARE °· Only take medicine as told by your doctor. °· Keep a healthy weight. Being overweight can make the knee hurt more. °· Stretch before exercising or playing sports. °· If there is constant knee pain, change the way you exercise. Ask your doctor for advice. °· Make sure shoes fit well. Choose the right shoe for the sport or activity. °· Protect your knees. Wear kneepads if needed. °· Rest when you are tired. °GET HELP RIGHT AWAY IF:  °· Your knee pain does not stop. °· Your knee pain does not get better. °· Your knee joint feels hot to the touch. °· You have a fever. °MAKE SURE YOU:  °· Understand these instructions. °· Will watch this condition. °· Will get help right away if you are not doing well or get worse. °Document Released: 12/21/2008 Document Revised: 12/17/2011 Document Reviewed: 12/21/2008 °[employer]® Patient Information ©2015 [employer]. This information is not intended to replace advice given to you by your health care provider. Make sure you discuss any questions you have with your health care provider. ° °

## 2015-06-23 NOTE — Progress Notes (Signed)
Pre visit review using our clinic review tool, if applicable. No additional management support is needed unless otherwise documented below in the visit note. 

## 2015-06-23 NOTE — Telephone Encounter (Signed)
Pt went to [facility] and they could not see her tonight without referral & auth from [facility]  Pt can be seen tomorrow 06/24/15 3:30pm. Please enter referral & obtain auth and notify Delbert Harness asap.

## 2015-06-24 DIAGNOSIS — S83241A Other tear of medial meniscus, current injury, right knee, initial encounter: Secondary | ICD-10-CM | POA: Diagnosis not present

## 2015-06-24 LAB — MICROALBUMIN, URINE, 24 HOUR
MICROALB 24H UR: 44.2 mg/d — AB (ref 0.0–30.0)
MICROALB UR: 1.7 mg/dL (ref <2.0)

## 2015-06-24 NOTE — Telephone Encounter (Signed)
Ref has been placed.     KP 

## 2015-07-02 ENCOUNTER — Other Ambulatory Visit: Payer: Self-pay | Admitting: Family Medicine

## 2015-07-05 ENCOUNTER — Ambulatory Visit (INDEPENDENT_AMBULATORY_CARE_PROVIDER_SITE_OTHER): Payer: Commercial Managed Care - HMO | Admitting: Family Medicine

## 2015-07-05 ENCOUNTER — Encounter: Payer: Self-pay | Admitting: Family Medicine

## 2015-07-05 VITALS — BP 120/68 | HR 56 | Temp 97.9°F | Ht 65.0 in | Wt 169.6 lb

## 2015-07-05 DIAGNOSIS — M1711 Unilateral primary osteoarthritis, right knee: Secondary | ICD-10-CM

## 2015-07-05 DIAGNOSIS — Z23 Encounter for immunization: Secondary | ICD-10-CM

## 2015-07-05 NOTE — Progress Notes (Signed)
Patient ID: [name], female    DOB: 1946/06/09  Age: 69 y.o. MRN: [id number]    Subjective:  Subjective HPI [name] presents for R knee pain with weight bearing only.  She went to after hours clinic last week with ortho and was given an injection and pain and swelling was back by next day.    Review of Systems  Constitutional: Negative for diaphoresis, appetite change, fatigue and unexpected weight change.  Eyes: Negative for pain, redness and visual disturbance.  Respiratory: Negative for cough, chest tightness, shortness of breath and wheezing.   Cardiovascular: Negative for chest pain, palpitations and leg swelling.  Endocrine: Negative for cold intolerance, heat intolerance, polydipsia, polyphagia and polyuria.  Genitourinary: Negative for dysuria, frequency and difficulty urinating.  Musculoskeletal: Positive for joint swelling and arthralgias.  Neurological: Negative for dizziness, light-headedness, numbness and headaches.    History Past Medical History  Diagnosis Date  . Diabetes mellitus   . Hypertension   . Hyperlipidemia   . Depression   . Restless leg syndrome   . Pancreatitis   . Blockage of coronary artery of heart     She has past surgical history that includes Tubal ligation; Rotator cuff repair; and Coronary angioplasty with stent.   Her family history includes Breast cancer in an other family member; Cancer in her other; Lung cancer in an other family member; Thyroid cancer in an other family member; Uterine cancer in an other family member.She reports that she has quit smoking. She has never used smokeless tobacco. She reports that she does not drink alcohol or use illicit drugs.  Current Outpatient Prescriptions on File Prior to Visit  Medication Sig Dispense Refill  . alendronate (FOSAMAX) 70 MG tablet take 1 tablet by mouth every week on an empty stomach with 6-8 oz of water. No food / meds for 30 min. Remain Upright 4 tablet 11  .  ALPRAZolam (XANAX) 0.25 MG tablet Take 1 tablet (0.25 mg total) by mouth 3 (three) times daily as needed for anxiety. 60 tablet 0  . amLODipine (NORVASC) 10 MG tablet take 1 tablet by mouth once daily 90 tablet 1  . aspirin EC 81 MG tablet Take 81 mg by mouth daily.    [name] atorvastatin (LIPITOR) 40 MG tablet take 1 tablet by mouth once daily LABS ARE DUE NOW 30 tablet 0  . Blood Glucose Calibration (TAI DOC CONTROL) NORMAL SOLN     . fexofenadine (ALLEGRA) 180 MG tablet Take 180 mg by mouth daily. Takes 4 days a week    . Flaxseed, Linseed, (FLAXSEED OIL PO) Take 1 tablet by mouth daily.    [name] FLUoxetine (PROZAC) 20 MG tablet Take 1 tablet (20 mg total) by mouth daily. 30 tablet 3  . gabapentin (NEURONTIN) 100 MG capsule Take 100 mg by mouth.  0  . glucose blood (ACCU-CHEK AVIVA) test strip Check blood sugar four times daily. E11.9 400 each 3  . HYDROcodone-acetaminophen (NORCO) 5-325 MG per tablet Take 1 tablet by mouth every 6 (six) hours as needed for moderate pain. 30 tablet 0  . Insulin Pen Needle (PEN NEEDLES 31GX5/16") 31G X 8 MM MISC by Does not apply route daily.      . Insulin Syringe-Needle U-100 (BD INSULIN SYRINGE ULTRAFINE) 31G X 5/16" 0.3 ML MISC 10 Units by Does not apply route 3 (three) times daily before meals.      Elmore Guise Devices (LANCING DEVICE) MISC     . lisinopril (PRINIVIL,ZESTRIL) 10  MG tablet take 1 tablet by mouth once daily 90 tablet 3  . meclizine (ANTIVERT) 25 MG tablet Take 1 tablet (25 mg total) by mouth 3 (three) times daily as needed for dizziness. 10 tablet 0  . metoprolol succinate (TOPROL-XL) 50 MG 24 hr tablet take 1 tablet by mouth WITH OR IMMEDIATELY FOLLOWING A MEAL 90 tablet 1  . Multiple Vitamin (MULTIVITAMIN WITH MINERALS) TABS Take 1 tablet by mouth daily.    [name] NOVOLIN N RELION 100 UNIT/ML injection Inject 40 Units into the skin at bedtime.     [name] NOVOLIN R RELION 100 UNIT/ML injection Inject 30 Units into the skin 3 (three) times daily.    [name] omega-3  acid ethyl esters (LOVAZA) 1 G capsule Take 2 capsules (2 g total) by mouth 2 (two) times daily. (Patient taking differently: Take 1 g by mouth daily. ) 120 capsule 5  . ondansetron (ZOFRAN) 4 MG tablet Take 1 tablet (4 mg total) by mouth every 6 (six) hours. 12 tablet 0  . PHARMACIST CHOICE LANCETS MISC     . traMADol (ULTRAM) 50 MG tablet Take 1 tablet (50 mg total) by mouth every 8 (eight) hours as needed. 30 tablet 0   No current facility-administered medications on file prior to visit.     Objective:  Objective Physical Exam  Constitutional: She is oriented to person, place, and time. She appears well-developed and well-nourished.  HENT:  Head: Normocephalic and atraumatic.  Eyes: Conjunctivae and EOM are normal.  Neck: Normal range of motion. Neck supple. No JVD present. Carotid bruit is not present. No thyromegaly present.  Pulmonary/Chest: Effort normal and breath sounds normal.  Musculoskeletal: She exhibits edema.       Right knee: Tenderness found.  Neurological: She is alert and oriented to person, place, and time.  Psychiatric: She has a normal mood and affect. Her behavior is normal.  Nursing note and vitals reviewed.  BP 120/68 mmHg  Pulse 56  Temp(Src) 97.9 F (36.6 C) (Oral)  Ht _0  (1.651 m)  Wt 169 lb 9.6 oz (76.93 kg)  BMI 28.22 kg/m2  SpO2 99% Wt Readings from Last 3 Encounters:  07/05/15 169 lb 9.6 oz (76.93 kg)  06/23/15 173 lb (78.472 kg)  06/02/15 168 lb 9.6 oz (76.476 kg)     Lab Results  Component Value Date   WBC 5.8 06/02/2015   HGB 12.4 06/02/2015   HCT 37.2 06/02/2015   PLT 186.0 06/02/2015   GLUCOSE 111* 06/02/2015   CHOL 134 06/02/2015   TRIG 105.0 06/02/2015   HDL 34.70* 06/02/2015   LDLDIRECT 57.2 11/17/2013   LDLCALC 78 06/02/2015   ALT 20 06/02/2015   AST 22 06/02/2015   NA 139 06/02/2015   K 3.3* 06/02/2015   CL 104 06/02/2015   CREATININE 0.85 06/02/2015   BUN 19 06/02/2015   CO2 25 06/02/2015   TSH 0.68 06/02/2015    INR 1.1 03/03/2009   HGBA1C 9.5* 06/02/2015   MICROALBUR 1.7 06/23/2015    Dg Knee Complete 4 Views Right  06/19/2015   CLINICAL DATA:  Right knee pain and swelling for 2 days  EXAM: RIGHT KNEE - COMPLETE 4+ VIEW  COMPARISON:  05/27/2008  FINDINGS: Minimal tricompartmental degenerative change. No fracture or dislocation. Tibial spine spurring is identified. Trace suprapatellar fluid. Vascular calcifications.  IMPRESSION: Minimal tricompartmental degenerative change without focal acute finding.   Electronically Signed   By: Conchita Paris M.D.   On: 06/19/2015 12:20  Assessment & Plan:  Plan I am having Ms. Garlow maintain her Insulin Syringe-Needle U-100, PEN NEEDLES 31GX5/16", NOVOLIN R RELION, NOVOLIN N RELION, multivitamin with minerals, fexofenadine, Lancing Device, TAI DOC CONTROL, aspirin EC, PHARMACIST CHOICE LANCETS, omega-3 acid ethyl esters, ALPRAZolam, amLODipine, FLUoxetine, lisinopril, glucose blood, metoprolol succinate, (Flaxseed, Linseed, (FLAXSEED OIL PO)), meclizine, ondansetron, atorvastatin, traMADol, gabapentin, HYDROcodone-acetaminophen, alendronate, CINNAMON PO, and GINKGO BILOBA COMPLEX PO.  Meds ordered this encounter  Medications  . CINNAMON PO    Sig: Take by mouth.  [name] GINKGO BILOBA COMPLEX PO    Sig: Take by mouth.    Problem List Items Addressed This Visit    Osteoarthritis of right knee - Primary    Pt to see ortho tomorrow Ice, rest  con't with sleeve prn      Relevant Orders   Ambulatory referral to Orthopedic Surgery    high dose flu given  Follow-up: Return if symptoms worsen or fail to improve, for as scheduled.  Garnet Koyanagi, DO

## 2015-07-05 NOTE — Patient Instructions (Signed)

## 2015-07-05 NOTE — Progress Notes (Signed)
Pre visit review using our clinic review tool, if applicable. No additional management support is needed unless otherwise documented below in the visit note. 

## 2015-07-05 NOTE — Addendum Note (Signed)
Addended by: Neldon Labella on: 07/05/2015 10:31 AM   Modules accepted: Orders

## 2015-07-05 NOTE — Assessment & Plan Note (Signed)
Pt to see ortho tomorrow Ice, rest  con't with sleeve prn

## 2015-07-06 DIAGNOSIS — S83241A Other tear of medial meniscus, current injury, right knee, initial encounter: Secondary | ICD-10-CM | POA: Diagnosis not present

## 2015-07-13 ENCOUNTER — Other Ambulatory Visit: Payer: Self-pay | Admitting: Family Medicine

## 2015-07-13 MED ORDER — ATORVASTATIN CALCIUM 40 MG PO TABS: ORAL_TABLET | ORAL | Status: DC

## 2015-07-17 ENCOUNTER — Other Ambulatory Visit: Payer: Self-pay | Admitting: Family Medicine

## 2015-07-20 DIAGNOSIS — S83241D Other tear of medial meniscus, current injury, right knee, subsequent encounter: Secondary | ICD-10-CM | POA: Diagnosis not present

## 2015-07-26 DIAGNOSIS — M25561 Pain in right knee: Secondary | ICD-10-CM | POA: Diagnosis not present

## 2015-07-28 DIAGNOSIS — S83281A Other tear of lateral meniscus, current injury, right knee, initial encounter: Secondary | ICD-10-CM | POA: Diagnosis not present

## 2015-07-28 DIAGNOSIS — M1711 Unilateral primary osteoarthritis, right knee: Secondary | ICD-10-CM | POA: Diagnosis not present

## 2015-08-04 DIAGNOSIS — S83281A Other tear of lateral meniscus, current injury, right knee, initial encounter: Secondary | ICD-10-CM | POA: Diagnosis not present

## 2015-08-09 ENCOUNTER — Ambulatory Visit: Payer: Self-pay

## 2015-08-15 ENCOUNTER — Telehealth: Payer: Self-pay | Admitting: *Deleted

## 2015-08-15 NOTE — Telephone Encounter (Signed)
S[facility] clearance [facility]. Most recent EKG and form forwarded to Dr. Laury AxonLowne. JG//CMA

## 2015-08-17 ENCOUNTER — Other Ambulatory Visit: Payer: Self-pay | Admitting: Family Medicine

## 2015-08-17 NOTE — Telephone Encounter (Signed)
[facility]

## 2015-09-08 ENCOUNTER — Other Ambulatory Visit: Payer: Self-pay | Admitting: Family Medicine

## 2015-09-08 NOTE — Telephone Encounter (Signed)
Rx denied-Rx filled on 07-13-15-#30,4.//AB/CMA

## 2015-09-21 ENCOUNTER — Telehealth: Payer: Self-pay | Admitting: Family Medicine

## 2015-09-21 NOTE — Telephone Encounter (Signed)
Please advise      KP 

## 2015-09-21 NOTE — Telephone Encounter (Signed)
Pt states that she needs tb test for employment. Scheduled for 12/16

## 2015-09-22 ENCOUNTER — Other Ambulatory Visit: Payer: Self-pay | Admitting: Family Medicine

## 2015-09-22 NOTE — Telephone Encounter (Signed)
Ok to do ppd

## 2015-09-23 ENCOUNTER — Ambulatory Visit (INDEPENDENT_AMBULATORY_CARE_PROVIDER_SITE_OTHER): Payer: Commercial Managed Care - HMO

## 2015-09-23 DIAGNOSIS — Z111 Encounter for screening for respiratory tuberculosis: Secondary | ICD-10-CM | POA: Diagnosis not present

## 2015-09-23 NOTE — Telephone Encounter (Signed)
Last seen 07/05/15 and filled 09/17/14 #60   Please advise      KP

## 2015-09-26 LAB — TB SKIN TEST
Induration: 0 mm
TB Skin Test: NEGATIVE

## 2015-09-29 ENCOUNTER — Encounter (HOSPITAL_BASED_OUTPATIENT_CLINIC_OR_DEPARTMENT_OTHER): Payer: Self-pay | Admitting: *Deleted

## 2015-09-29 ENCOUNTER — Emergency Department (HOSPITAL_BASED_OUTPATIENT_CLINIC_OR_DEPARTMENT_OTHER)
Admission: EM | Admit: 2015-09-29 | Discharge: 2015-09-29 | Disposition: A | Discharge status: Home / Self Care | Payer: Commercial Managed Care - HMO | Attending: Emergency Medicine | Admitting: Emergency Medicine

## 2015-09-29 DIAGNOSIS — M79601 Pain in right arm: Secondary | ICD-10-CM | POA: Diagnosis not present

## 2015-09-29 DIAGNOSIS — M79602 Pain in left arm: Secondary | ICD-10-CM | POA: Diagnosis not present

## 2015-09-29 DIAGNOSIS — I10 Essential (primary) hypertension: Secondary | ICD-10-CM | POA: Insufficient documentation

## 2015-09-29 DIAGNOSIS — M25522 Pain in left elbow: Secondary | ICD-10-CM | POA: Diagnosis not present

## 2015-09-29 DIAGNOSIS — Z794 Long term (current) use of insulin: Secondary | ICD-10-CM | POA: Diagnosis not present

## 2015-09-29 DIAGNOSIS — M79605 Pain in left leg: Secondary | ICD-10-CM | POA: Diagnosis not present

## 2015-09-29 DIAGNOSIS — E785 Hyperlipidemia, unspecified: Secondary | ICD-10-CM | POA: Insufficient documentation

## 2015-09-29 DIAGNOSIS — E119 Type 2 diabetes mellitus without complications: Secondary | ICD-10-CM | POA: Insufficient documentation

## 2015-09-29 DIAGNOSIS — M791 Myalgia, unspecified site: Secondary | ICD-10-CM

## 2015-09-29 DIAGNOSIS — Z79899 Other long term (current) drug therapy: Secondary | ICD-10-CM | POA: Insufficient documentation

## 2015-09-29 DIAGNOSIS — Z9861 Coronary angioplasty status: Secondary | ICD-10-CM | POA: Diagnosis not present

## 2015-09-29 DIAGNOSIS — F329 Major depressive disorder, single episode, unspecified: Secondary | ICD-10-CM | POA: Diagnosis not present

## 2015-09-29 DIAGNOSIS — M25532 Pain in left wrist: Secondary | ICD-10-CM | POA: Diagnosis not present

## 2015-09-29 DIAGNOSIS — R253 Fasciculation: Secondary | ICD-10-CM | POA: Diagnosis not present

## 2015-09-29 DIAGNOSIS — Z7982 Long term (current) use of aspirin: Secondary | ICD-10-CM | POA: Diagnosis not present

## 2015-09-29 DIAGNOSIS — R110 Nausea: Secondary | ICD-10-CM | POA: Diagnosis not present

## 2015-09-29 DIAGNOSIS — M79604 Pain in right leg: Secondary | ICD-10-CM | POA: Diagnosis not present

## 2015-09-29 DIAGNOSIS — Z87891 Personal history of nicotine dependence: Secondary | ICD-10-CM | POA: Diagnosis not present

## 2015-09-29 DIAGNOSIS — M25572 Pain in left ankle and joints of left foot: Secondary | ICD-10-CM | POA: Diagnosis not present

## 2015-09-29 DIAGNOSIS — Z8719 Personal history of other diseases of the digestive system: Secondary | ICD-10-CM | POA: Diagnosis not present

## 2015-09-29 LAB — COMPREHENSIVE METABOLIC PANEL
ALK PHOS: 53 U/L (ref 38–126)
ALT: 18 U/L (ref 14–54)
ANION GAP: 7 (ref 5–15)
AST: 19 U/L (ref 15–41)
Albumin: 4 g/dL (ref 3.5–5.0)
BUN: 18 mg/dL (ref 6–20)
CALCIUM: 9.3 mg/dL (ref 8.9–10.3)
CO2: 26 mmol/L (ref 22–32)
CREATININE: 0.56 mg/dL (ref 0.44–1.00)
Chloride: 105 mmol/L (ref 101–111)
Glucose, Bld: 162 mg/dL — ABNORMAL HIGH (ref 65–99)
Potassium: 3.7 mmol/L (ref 3.5–5.1)
Sodium: 138 mmol/L (ref 135–145)
Total Bilirubin: 0.6 mg/dL (ref 0.3–1.2)
Total Protein: 7.3 g/dL (ref 6.5–8.1)

## 2015-09-29 LAB — CBC WITH DIFFERENTIAL/PLATELET
Basophils Absolute: 0 10*3/uL (ref 0.0–0.1)
Basophils Relative: 0 %
EOS PCT: 2 %
Eosinophils Absolute: 0.1 10*3/uL (ref 0.0–0.7)
HCT: 36.3 % (ref 36.0–46.0)
HEMOGLOBIN: 12.2 g/dL (ref 12.0–15.0)
LYMPHS ABS: 2.8 10*3/uL (ref 0.7–4.0)
LYMPHS PCT: 45 %
MCH: 29.2 pg (ref 26.0–34.0)
MCHC: 33.6 g/dL (ref 30.0–36.0)
MCV: 86.8 fL (ref 78.0–100.0)
MONOS PCT: 8 %
Monocytes Absolute: 0.5 10*3/uL (ref 0.1–1.0)
Neutro Abs: 2.8 10*3/uL (ref 1.7–7.7)
Neutrophils Relative %: 45 %
PLATELETS: 173 10*3/uL (ref 150–400)
RBC: 4.18 MIL/uL (ref 3.87–5.11)
RDW: 12.2 % (ref 11.5–15.5)
WBC: 6.2 10*3/uL (ref 4.0–10.5)

## 2015-09-29 LAB — TROPONIN I: Troponin I: <0.03 ng/mL (ref <0.031)

## 2015-09-29 LAB — CK: CK TOTAL: 204 U/L (ref 38–234)

## 2015-09-29 NOTE — ED Provider Notes (Signed)
CSN: [id number]     Arrival date & time 09/29/15  5638 History   None    Chief Complaint  Patient presents with  . Muscle Pain     (Consider location/radiation/quality/duration/timing/severity/associated sxs/prior Treatment) HPI  69 yo F w/ 3 days of intermittent left eye twitching. Today with left wrist, elbow and ankle pain and one short episode of nausea. No CP, SOB, myalgias, fevers or other complaints at this time.   Past Medical History  Diagnosis Date  . Diabetes mellitus   . Hypertension   . Hyperlipidemia   . Depression   . Restless leg syndrome   . Pancreatitis   . Blockage of coronary artery of heart [facility])    Past Surgical History  Procedure Laterality Date  . Tubal ligation    . Rotator cuff repair      right  . Coronary angioplasty with stent placement    . Foot surgery Right     cyst removal   Family History  Problem Relation Age of Onset  . Lung cancer    . Breast cancer    . Thyroid cancer    . Uterine cancer    . Cancer Other     lung,thyroid,breast,uterine   Social History  Substance Use Topics  . Smoking status: Former Smoker -- 0.00 packs/day  . Smokeless tobacco: Never Used  . Alcohol Use: No   OB History    No data available     Review of Systems  Constitutional: Negative for fever.  HENT: Negative for congestion and facial swelling.   Eyes: Negative for discharge and redness.  Respiratory: Negative for cough and shortness of breath.   Cardiovascular: Negative for chest pain.  Gastrointestinal: Negative for abdominal pain and abdominal distention.  Endocrine: Negative for polydipsia.  Genitourinary: Negative for dysuria and flank pain.  Musculoskeletal: Positive for arthralgias. Negative for back pain.  Skin: Negative for wound.  Neurological: Negative for tremors, syncope and headaches.       Twitching of left eye  All other systems reviewed and are negative.     Allergies  Levemir and Metformin and related  Home  Medications   Prior to Admission medications   Medication Sig Start Date End Date Taking? Authorizing Provider  alendronate (FOSAMAX) 70 MG tablet take 1 tablet by mouth every week on an empty stomach with 6-8 oz of water. No food / meds for 30 min. Remain Upright 07/04/15  Yes Rosalita Chessman, DO  ALPRAZolam Duanne Moron) 0.25 MG tablet take 1 tablet by mouth three times a day if needed 09/23/15  Yes Rosalita Chessman, DO  amLODipine (NORVASC) 10 MG tablet take 1 tablet by mouth once daily 07/18/15  Yes Rosalita Chessman, DO  aspirin EC 81 MG tablet Take 81 mg by mouth daily.   Yes Historical Provider, MD  atorvastatin (LIPITOR) 40 MG tablet take 1 tablet by mouth once daily 07/13/15  Yes Alferd Apa Lowne, DO  Blood Glucose Calibration (TAI DOC CONTROL) NORMAL SOLN  10/19/12  Yes Historical Provider, MD  CINNAMON PO Take by mouth.   Yes Historical Provider, MD  fexofenadine (ALLEGRA) 180 MG tablet Take 180 mg by mouth daily. Takes 4 days a week   Yes Historical Provider, MD  Flaxseed, Linseed, (FLAXSEED OIL PO) Take 1 tablet by mouth daily.   Yes Historical Provider, MD  FLUoxetine (PROZAC) 20 MG capsule take 1 capsule by mouth once daily 09/22/15  Yes Yvonne R Lowne, DO  gabapentin (NEURONTIN) 100 MG  capsule Take 100 mg by mouth. 06/09/15  Yes Historical Provider, MD  Loel Ro COMPLEX PO Take by mouth.   Yes Historical Provider, MD  glucose blood (ACCU-CHEK AVIVA) test strip Check blood sugar four times daily. E11.9 12/14/14  Yes Yvonne R Lowne, DO  Insulin Pen Needle (PEN NEEDLES 31GX5/16") 31G X 8 MM MISC by Does not apply route daily.     Yes Historical Provider, MD  Insulin Syringe-Needle U-100 (BD INSULIN SYRINGE ULTRAFINE) 31G X 5/16" 0.3 ML MISC 10 Units by Does not apply route 3 (three) times daily before meals.     Yes Historical Provider, MD  Lancet Devices (LANCING DEVICE) MISC  10/19/12  Yes Historical Provider, MD  lisinopril (PRINIVIL,ZESTRIL) 10 MG tablet take 1 tablet by mouth once daily 12/13/14   Yes Alferd Apa Lowne, DO  metoprolol succinate (TOPROL-XL) 50 MG 24 hr tablet take 1 tablet by mouth once daily WITH FOOD OR IMMEDIATELY FOLLOWING A MEAL 08/17/15  Yes Rosalita Chessman, DO  Multiple Vitamin (MULTIVITAMIN WITH MINERALS) TABS Take 1 tablet by mouth daily.   Yes Historical Provider, MD  NOVOLIN N RELION 100 UNIT/ML injection Inject 40 Units into the skin at bedtime.  05/05/12  Yes Historical Provider, MD  NOVOLIN R RELION 100 UNIT/ML injection Inject 30 Units into the skin 3 (three) times daily. 05/29/12  Yes Historical Provider, MD  omega-3 acid ethyl esters (LOVAZA) 1 G capsule Take 2 capsules (2 g total) by mouth 2 (two) times daily. Patient taking differently: Take 1 g by mouth daily.  09/17/14  Yes Rosalita Chessman, DO  PHARMACIST CHOICE LANCETS [place]  10/31/13  Yes Historical Provider, MD  traMADol (ULTRAM) 50 MG tablet Take 1 tablet (50 mg total) by mouth every 8 (eight) hours as needed. 06/02/15  Yes Rosalita Chessman, DO  vitamin E 100 UNIT capsule Take by mouth daily.   Yes Historical Provider, MD  HYDROcodone-acetaminophen (NORCO) 5-325 MG per tablet Take 1 tablet by mouth every 6 (six) hours as needed for moderate pain. 06/23/15   Rosalita Chessman, DO  meclizine (ANTIVERT) 25 MG tablet Take 1 tablet (25 mg total) by mouth 3 (three) times daily as needed for dizziness. 12/31/14   Hanna Patel-Mills, PA-C  ondansetron (ZOFRAN) 4 MG tablet Take 1 tablet (4 mg total) by mouth every 6 (six) hours. 12/31/14   Hanna Patel-Mills, PA-C   BP 136/66 mmHg  Pulse 54  Temp(Src) 97.6 F (36.4 C) (Oral)  Resp 18  Ht '5\' 5"'  (1.651 m)  Wt 170 lb (77.111 kg)  BMI 28.29 kg/m2  SpO2 99% Physical Exam  Constitutional: She is oriented to person, place, and time. She appears well-developed and well-nourished.  HENT:  Head: Normocephalic and atraumatic.  Neck: Normal range of motion.  Cardiovascular: Normal rate and regular rhythm.   Pulmonary/Chest: No stridor. No respiratory distress.  Abdominal: She  exhibits no distension.  Neurological: She is alert and oriented to person, place, and time.  No altered mental status, able to give full seemingly accurate history.  Face is symmetric, EOM's intact, pupils equal and reactive, vision intact, tongue and uvula midline without deviation Upper and Lower extremity motor 5/5, intact pain perception in distal extremities, 2+ reflexes in biceps, patella and achilles tendons. Finger to nose normal, heel to shin normal. Walks without assistance or evident ataxia.   Nursing note and vitals reviewed.   ED Course  Procedures (including critical care time) Labs Review Labs Reviewed  COMPREHENSIVE METABOLIC PANEL -  Abnormal; Notable for the following:    Glucose, Bld 162 (*)    All other components within normal limits  CBC WITH DIFFERENTIAL/PLATELET  CK  TROPONIN I  TROPONIN I    Imaging Review No results found. I have personally reviewed and evaluated these images and lab results as part of my medical decision-making.   EKG Interpretation   Date/Time:  Thursday September 29 2015 09:59:59 EST Ventricular Rate:  49 PR Interval:  158 QRS Duration: 124 QT Interval:  484 QTC Calculation: 437 R Axis:   -65 Text Interpretation:  Sinus bradycardia Right bundle branch block Left  anterior fascicular block  Bifascicular block  Cannot rule out  Inferior infarct (masked by fascicular block?) , age undetermined Abnormal  ECG No significant change since last tracing Confirmed by [facility] MD, Corene Cornea  [phone]) on 09/29/2015 10:22:43 AM      MDM   Final diagnoses:  Myalgia   Low likelihood of ACS however has lots of medical history and with unclear left arm pain, will eval with ecg and delta troponins. Doubt central neuro causes such as CVA. Will also check CK to ensure no e/o muscle breakdown. Likely OA vs early viral syndrome, conservative tx at home.     Merrily Pew, MD 09/29/15 [phone]

## 2015-09-29 NOTE — ED Notes (Signed)
Patient states she has a three day history of left eye and left lip twitching, a crick in her left neck and this morning woke up with pain in the left arm and mild nausea.

## 2015-11-08 ENCOUNTER — Telehealth: Payer: Self-pay | Admitting: Family Medicine

## 2015-11-08 DIAGNOSIS — E119 Type 2 diabetes mellitus without complications: Secondary | ICD-10-CM

## 2015-11-08 NOTE — Telephone Encounter (Signed)
Pt called requesting referral to Dr. Talmage Nap for diabetes management. They will not give her appt until she has referral/auth.  Dr. Dorisann Frames, MD Endocrinology, Diabetes & Metabolism   Female   70 years old &  24 reviews  Leave a review  [facility]  [address] [place], [place] [address]

## 2015-11-08 NOTE — Telephone Encounter (Signed)
Ref has been placed, she is already established.      KP

## 2015-11-14 ENCOUNTER — Encounter: Payer: Self-pay | Admitting: Family Medicine

## 2015-11-14 ENCOUNTER — Ambulatory Visit (INDEPENDENT_AMBULATORY_CARE_PROVIDER_SITE_OTHER): Payer: Commercial Managed Care - HMO | Admitting: Family Medicine

## 2015-11-14 VITALS — BP 130/63 | HR 79 | Temp 98.3°F | Ht 65.0 in | Wt 172.2 lb

## 2015-11-14 DIAGNOSIS — E785 Hyperlipidemia, unspecified: Secondary | ICD-10-CM

## 2015-11-14 DIAGNOSIS — I10 Essential (primary) hypertension: Secondary | ICD-10-CM | POA: Diagnosis not present

## 2015-11-14 DIAGNOSIS — M25561 Pain in right knee: Secondary | ICD-10-CM | POA: Diagnosis not present

## 2015-11-14 DIAGNOSIS — E1151 Type 2 diabetes mellitus with diabetic peripheral angiopathy without gangrene: Secondary | ICD-10-CM

## 2015-11-14 DIAGNOSIS — J302 Other seasonal allergic rhinitis: Secondary | ICD-10-CM | POA: Diagnosis not present

## 2015-11-14 DIAGNOSIS — J0140 Acute pansinusitis, unspecified: Secondary | ICD-10-CM | POA: Diagnosis not present

## 2015-11-14 LAB — CBC WITH DIFFERENTIAL/PLATELET
BASOS ABS: 0 10*3/uL (ref 0.0–0.1)
Basophils Relative: 0.6 % (ref 0.0–3.0)
Eosinophils Absolute: 0.1 10*3/uL (ref 0.0–0.7)
Eosinophils Relative: 1.7 % (ref 0.0–5.0)
HCT: 37.4 % (ref 36.0–46.0)
Hemoglobin: 12.2 g/dL (ref 12.0–15.0)
LYMPHS ABS: 3.6 10*3/uL (ref 0.7–4.0)
LYMPHS PCT: 47.2 % — AB (ref 12.0–46.0)
MCHC: 32.7 g/dL (ref 30.0–36.0)
MCV: 88.1 fl (ref 78.0–100.0)
MONOS PCT: 5.4 % (ref 3.0–12.0)
Monocytes Absolute: 0.4 10*3/uL (ref 0.1–1.0)
NEUTROS PCT: 45.1 % (ref 43.0–77.0)
Neutro Abs: 3.4 10*3/uL (ref 1.4–7.7)
Platelets: 161 10*3/uL (ref 150.0–400.0)
RBC: 4.24 Mil/uL (ref 3.87–5.11)
RDW: 13 % (ref 11.5–15.5)
WBC: 7.6 10*3/uL (ref 4.0–10.5)

## 2015-11-14 LAB — COMPREHENSIVE METABOLIC PANEL
ALK PHOS: 62 U/L (ref 39–117)
ALT: 17 U/L (ref 0–35)
AST: 15 U/L (ref 0–37)
Albumin: 4.2 g/dL (ref 3.5–5.2)
BILIRUBIN TOTAL: 0.6 mg/dL (ref 0.2–1.2)
BUN: 17 mg/dL (ref 6–23)
CO2: 28 mEq/L (ref 19–32)
CREATININE: 0.76 mg/dL (ref 0.40–1.20)
Calcium: 9.7 mg/dL (ref 8.4–10.5)
Chloride: 102 mEq/L (ref 96–112)
GFR: 96.8 mL/min (ref >60.00)
GLUCOSE: 338 mg/dL — AB (ref 70–99)
Potassium: 3.9 mEq/L (ref 3.5–5.1)
Sodium: 136 mEq/L (ref 135–145)
TOTAL PROTEIN: 7 g/dL (ref 6.0–8.3)

## 2015-11-14 LAB — LIPID PANEL
Cholesterol: 124 mg/dL (ref 0–200)
HDL: 29.7 mg/dL — AB (ref >39.00)
NONHDL: 94.01
Total CHOL/HDL Ratio: 4
Triglycerides: 264 mg/dL — ABNORMAL HIGH (ref 0.0–149.0)
VLDL: 52.8 mg/dL — ABNORMAL HIGH (ref 0.0–40.0)

## 2015-11-14 LAB — HEMOGLOBIN A1C: HEMOGLOBIN A1C: 11.1 % — AB (ref 4.6–6.5)

## 2015-11-14 LAB — LDL CHOLESTEROL, DIRECT: Direct LDL: 59 mg/dL

## 2015-11-14 MED ORDER — FLUTICASONE PROPIONATE 50 MCG/ACT NA SUSP: 2.0000 | Freq: Every day | NASAL | Status: DC

## 2015-11-14 MED ORDER — LEVOCETIRIZINE DIHYDROCHLORIDE 5 MG PO TABS: 5.0000 mg | ORAL_TABLET | Freq: Every evening | ORAL | Status: DC

## 2015-11-14 MED ORDER — AMOXICILLIN-POT CLAVULANATE 875-125 MG PO TABS: 1.0000 | ORAL_TABLET | Freq: Two times a day (BID) | ORAL | Status: DC

## 2015-11-14 MED ORDER — HYDROCODONE-ACETAMINOPHEN 5-325 MG PO TABS: 1.0000 | ORAL_TABLET | Freq: Four times a day (QID) | ORAL | Status: DC | PRN

## 2015-11-14 NOTE — Patient Instructions (Signed)

## 2015-11-14 NOTE — Progress Notes (Signed)
Pre visit review using our clinic review tool, if applicable. No additional management support is needed unless otherwise documented below in the visit note. 

## 2015-11-14 NOTE — Progress Notes (Signed)
Subjective:     [name] is a 70 y.o. female who presents for evaluation of sinus pain. Symptoms include: congestion, cough, facial pain, headaches, nasal congestion, sinus pressure, sneezing and sore throat. Onset of symptoms was 2 weeks ago. Symptoms have been gradually worsening since that time. Past history is significant for no history of pneumonia or bronchitis. Patient is a non-smoker. She also c/o pain / mass in R forearm --- no known injury.  Mass has resolved  The following portions of the patient's history were reviewed and updated as appropriate:  She  has a past medical history of Diabetes mellitus; Hypertension; Hyperlipidemia; Depression; Restless leg syndrome; Pancreatitis; and Blockage of coronary artery of heart ([place]). She  does not have any pertinent problems on file. She  has past surgical history that includes Tubal ligation; Rotator cuff repair; Coronary angioplasty with stent; and Foot surgery (Right). Her family history includes Cancer in her other. She  reports that she has quit smoking. She has never used smokeless tobacco. She reports that she does not drink alcohol or use illicit drugs. She has a current medication list which includes the following prescription(s): alendronate, alprazolam, amlodipine, aspirin ec, atorvastatin, tai doc control, cinnamon, fexofenadine, flaxseed (linseed), fluoxetine, gabapentin, ginkgo biloba, glucose blood, hydrocodone-acetaminophen, pen needles 31gx5/16", insulin syringe-needle u-100, lancing device, lisinopril, meclizine, metoprolol succinate, multivitamin with minerals, novolin n relion, novolin r relion, omega-3 acid ethyl esters, ondansetron, pharmacist choice lancets, and vitamin e. Current Outpatient Prescriptions on File Prior to Visit  Medication Sig Dispense Refill  . alendronate (FOSAMAX) 70 MG tablet take 1 tablet by mouth every week on an empty stomach with 6-8 oz of water. No food / meds for 30 min. Remain Upright 4  tablet 11  . ALPRAZolam (XANAX) 0.25 MG tablet take 1 tablet by mouth three times a day if needed 60 tablet 0  . amLODipine (NORVASC) 10 MG tablet take 1 tablet by mouth once daily 90 tablet 1  . aspirin EC 81 MG tablet Take 81 mg by mouth daily.    [name] atorvastatin (LIPITOR) 40 MG tablet take 1 tablet by mouth once daily 30 tablet 4  . Blood Glucose Calibration (TAI DOC CONTROL) NORMAL SOLN     . CINNAMON PO Take by mouth.    . fexofenadine (ALLEGRA) 180 MG tablet Take 180 mg by mouth daily. Takes 4 days a week    . Flaxseed, Linseed, (FLAXSEED OIL PO) Take 1 tablet by mouth daily.    [name] FLUoxetine (PROZAC) 20 MG capsule take 1 capsule by mouth once daily 30 capsule 5  . gabapentin (NEURONTIN) 100 MG capsule Take 100 mg by mouth.  0  . GINKGO BILOBA COMPLEX PO Take by mouth.    [name] glucose blood (ACCU-CHEK AVIVA) test strip Check blood sugar four times daily. E11.9 400 each 3  . HYDROcodone-acetaminophen (NORCO) 5-325 MG per tablet Take 1 tablet by mouth every 6 (six) hours as needed for moderate pain. 30 tablet 0  . Insulin Pen Needle (PEN NEEDLES 31GX5/16") 31G X 8 MM MISC by Does not apply route daily.      . Insulin Syringe-Needle U-100 (BD INSULIN SYRINGE ULTRAFINE) 31G X 5/16" 0.3 ML MISC 10 Units by Does not apply route 3 (three) times daily before meals.      Elmore Guise Devices (LANCING DEVICE) MISC     . lisinopril (PRINIVIL,ZESTRIL) 10 MG tablet take 1 tablet by mouth once daily 90 tablet 3  . meclizine (ANTIVERT) 25 MG tablet  Take 1 tablet (25 mg total) by mouth 3 (three) times daily as needed for dizziness. 10 tablet 0  . metoprolol succinate (TOPROL-XL) 50 MG 24 hr tablet take 1 tablet by mouth once daily WITH FOOD OR IMMEDIATELY FOLLOWING A MEAL 90 tablet 0  . Multiple Vitamin (MULTIVITAMIN WITH MINERALS) TABS Take 1 tablet by mouth daily.    [name] NOVOLIN N RELION 100 UNIT/ML injection Inject 40 Units into the skin at bedtime.     [name] NOVOLIN R RELION 100 UNIT/ML injection Inject 30 Units  into the skin 3 (three) times daily.    [name] omega-3 acid ethyl esters (LOVAZA) 1 G capsule Take 2 capsules (2 g total) by mouth 2 (two) times daily. (Patient taking differently: Take 1 g by mouth daily. ) 120 capsule 5  . ondansetron (ZOFRAN) 4 MG tablet Take 1 tablet (4 mg total) by mouth every 6 (six) hours. 12 tablet 0  . PHARMACIST CHOICE LANCETS MISC     . vitamin E 100 UNIT capsule Take by mouth daily.     No current facility-administered medications on file prior to visit.   She is allergic to levemir and metformin and related..  Review of Systems Pertinent items are noted in HPI.   Objective:    BP 130/63 mmHg  Pulse 79  Temp(Src) 98.3 F (36.8 C) (Oral)  Ht '5\' 5"'  (1.651 m)  Wt 172 lb 3.2 oz (78.109 kg)  BMI 28.66 kg/m2  SpO2 97% General appearance: alert, cooperative, appears stated age and no distress Eyes: conjunctivae/corneas clear. PERRL, EOM's intact. Fundi benign. Ears: normal TM's and external ear canals both ears Nose: green discharge, moderate congestion, turbinates red, swollen, moderate frontal sinus tenderness bilateral Throat: lips, mucosa, and tongue normal; teeth and gums normal Neck: no adenopathy, no carotid bruit, no JVD, supple, symmetrical, trachea midline and thyroid not enlarged, symmetric, no tenderness/mass/nodules Lungs: clear to auscultation bilaterally Heart: regular rate and rhythm, S1, S2 normal, no murmur, click, rub or gallop Extremities: extremities normal, atraumatic, no cyanosis or edema    Assessment:    Acute bacterial sinusitis.    Plan:    Nasal saline sprays. Nasal steroids per medication orders. Antihistamines per medication orders. Augmentin per medication orders.

## 2015-11-22 ENCOUNTER — Telehealth: Payer: Self-pay | Admitting: Family Medicine

## 2015-11-22 MED ORDER — NYSTATIN 100000 UNIT/ML MT SUSP: OROMUCOSAL | Status: DC

## 2015-11-22 NOTE — Telephone Encounter (Signed)
Nystatin 5 mg swish and spit #150 cc

## 2015-11-22 NOTE — Telephone Encounter (Signed)
Caller name: Self Can be reached: [phone] Pharmacy:  [facility] [address] - [place], [place] - [address] [phone] (Phone) [phone] (Fax)         Reason for call: Patient states she has Thrash on her tongue and would like a rx

## 2015-11-22 NOTE — Telephone Encounter (Signed)
patient was put on Abx last week and now has thrush in her mouth. Please advise     KP

## 2015-11-22 NOTE — Telephone Encounter (Signed)
Rx faxed and the patient has been made aware.     KP 

## 2015-11-25 ENCOUNTER — Telehealth: Payer: Self-pay | Admitting: Family Medicine

## 2015-11-25 MED ORDER — ACCU-CHEK AVIVA PLUS W/DEVICE KIT: PACK | Status: DC

## 2015-11-25 NOTE — Telephone Encounter (Signed)
Relation to EH:UDJS Call back number:[phone] Pharmacy: [place], [place] [address] [phone] (Phone) [phone] (Fax)         Reason for call:  Patient requesting Rx for blood sugar monitor kit

## 2015-11-25 NOTE — Telephone Encounter (Signed)
Rx faxed.    KP 

## 2015-12-13 ENCOUNTER — Other Ambulatory Visit: Payer: Self-pay | Admitting: Family Medicine

## 2015-12-13 NOTE — Telephone Encounter (Signed)
Medication filled to pharmacy as requested.   

## 2015-12-21 DIAGNOSIS — E049 Nontoxic goiter, unspecified: Secondary | ICD-10-CM | POA: Diagnosis not present

## 2015-12-21 DIAGNOSIS — E7800 Pure hypercholesterolemia, unspecified: Secondary | ICD-10-CM | POA: Diagnosis not present

## 2015-12-21 DIAGNOSIS — E1165 Type 2 diabetes mellitus with hyperglycemia: Secondary | ICD-10-CM | POA: Diagnosis not present

## 2015-12-21 DIAGNOSIS — E1121 Type 2 diabetes mellitus with diabetic nephropathy: Secondary | ICD-10-CM | POA: Diagnosis not present

## 2015-12-21 DIAGNOSIS — I10 Essential (primary) hypertension: Secondary | ICD-10-CM | POA: Diagnosis not present

## 2015-12-29 ENCOUNTER — Encounter: Payer: Self-pay | Admitting: Medical

## 2015-12-29 ENCOUNTER — Ambulatory Visit (INDEPENDENT_AMBULATORY_CARE_PROVIDER_SITE_OTHER): Payer: Commercial Managed Care - HMO | Admitting: Medical

## 2015-12-29 ENCOUNTER — Ambulatory Visit (HOSPITAL_BASED_OUTPATIENT_CLINIC_OR_DEPARTMENT_OTHER)
Admission: RE | Admit: 2015-12-29 | Discharge: 2015-12-29 | Disposition: A | Discharge status: Home / Self Care | Payer: Commercial Managed Care - HMO | Source: Ambulatory Visit | Attending: Medical | Admitting: Medical

## 2015-12-29 VITALS — BP 124/76 | HR 88 | Temp 98.1°F | Ht 65.0 in | Wt 180.6 lb

## 2015-12-29 DIAGNOSIS — R5383 Other fatigue: Secondary | ICD-10-CM | POA: Diagnosis not present

## 2015-12-29 DIAGNOSIS — R635 Abnormal weight gain: Secondary | ICD-10-CM

## 2015-12-29 DIAGNOSIS — R600 Localized edema: Secondary | ICD-10-CM | POA: Insufficient documentation

## 2015-12-29 DIAGNOSIS — M7989 Other specified soft tissue disorders: Secondary | ICD-10-CM | POA: Diagnosis not present

## 2015-12-29 NOTE — Patient Instructions (Signed)
For your weight gain and edema of legs this may be dependant edema. But I want you to get cxr and labs to see if any other cause such as chf.   Elevate legs daily. Also may benefit from compresiion  stockings.  Will include on labs bnp which assess for chf.  For fatigue include cbc and cmp.  If this persist may consider occasional low dose diuretic.  Follow up in 7 days or as needed

## 2015-12-29 NOTE — Progress Notes (Signed)
Subjective:    Patient ID: [name], female    DOB: 12/06/1945, 70 y.o.   MRN: [id number]  HPI  Pt in with some bilateral feet swelling last Saturday night. Pt thinks swelling has been on and off. Pt states over past 2 wks gained about 6 pounds. Weighed 174 at diabetic MD office. Today 180. No sob or wheezing. But then states about 3 wks she felt fatigued for about one week. Then felt better.  No popliteal pain.   Review of Systems  Constitutional: Positive for fatigue. Negative for fever and chills.       See hpi.  HENT: Negative for congestion.   Respiratory: Negative for cough, shortness of breath and wheezing.   Cardiovascular: Negative for chest pain and palpitations.  Gastrointestinal: Negative for nausea, vomiting, abdominal pain and constipation.  Musculoskeletal: Negative for back pain.       Pedal edema.  Neurological: Negative for facial asymmetry and headaches.  Hematological: Negative for adenopathy. Does not bruise/bleed easily.  Psychiatric/Behavioral: Negative for behavioral problems and confusion.   Past Medical History  Diagnosis Date  . Diabetes mellitus   . Hypertension   . Hyperlipidemia   . Depression   . Restless leg syndrome   . Pancreatitis   . Blockage of coronary artery of heart [facility])     Social History   Social History  . Marital Status: Married    Spouse Name: N/A  . Number of Children: N/A  . Years of Education: N/A   Occupational History  . Not on file.   Social History Main Topics  . Smoking status: Former Smoker -- 0.00 packs/day  . Smokeless tobacco: Never Used  . Alcohol Use: No  . Drug Use: No  . Sexual Activity: Not on file   Other Topics Concern  . Not on file   Social History Narrative   Exercise: seldom   Caffeine use: seldom    Past Surgical History  Procedure Laterality Date  . Tubal ligation    . Rotator cuff repair      right  . Coronary angioplasty with stent placement    . Foot surgery Right    cyst removal    Family History  Problem Relation Age of Onset  . Lung cancer    . Breast cancer    . Thyroid cancer    . Uterine cancer    . Cancer Other     lung,thyroid,breast,uterine    Allergies  Allergen Reactions  . Levemir [Insulin Detemir] Hives and Swelling  . Metformin And Related Other (See Comments)    Sick on stomach, sweaty, throw up, pain    Current Outpatient Prescriptions on File Prior to Visit  Medication Sig Dispense Refill  . alendronate (FOSAMAX) 70 MG tablet take 1 tablet by mouth every week on an empty stomach with 6-8 oz of water. No food / meds for 30 min. Remain Upright 4 tablet 11  . ALPRAZolam (XANAX) 0.25 MG tablet take 1 tablet by mouth three times a day if needed 60 tablet 0  . amLODipine (NORVASC) 10 MG tablet take 1 tablet by mouth once daily 90 tablet 1  . aspirin EC 81 MG tablet Take 81 mg by mouth daily.    [name] atorvastatin (LIPITOR) 40 MG tablet take 1 tablet by mouth once daily 30 tablet 4  . Blood Glucose Calibration (TAI DOC CONTROL) NORMAL SOLN     . Blood Glucose Monitoring Suppl (ACCU-CHEK AVIVA PLUS) w/Device KIT Check blood  sugar daily. E11.9 1 kit 0  . CINNAMON PO Take by mouth.    . Flaxseed, Linseed, (FLAXSEED OIL PO) Take 1 tablet by mouth daily.    [name] FLUoxetine (PROZAC) 20 MG capsule take 1 capsule by mouth once daily 30 capsule 5  . fluticasone (FLONASE) 50 MCG/ACT nasal spray Place 2 sprays into both nostrils daily. 16 g 6  . gabapentin (NEURONTIN) 100 MG capsule Take 100 mg by mouth.  0  . GINKGO BILOBA COMPLEX PO Take by mouth.    [name] glucose blood (ACCU-CHEK AVIVA) test strip Check blood sugar four times daily. E11.9 400 each 3  . HYDROcodone-acetaminophen (NORCO) 5-325 MG tablet Take 1 tablet by mouth every 6 (six) hours as needed for moderate pain. 30 tablet 0  . Insulin Pen Needle (PEN NEEDLES 31GX5/16") 31G X 8 MM MISC by Does not apply route daily.      . Insulin Syringe-Needle U-100 (BD INSULIN SYRINGE ULTRAFINE) 31G X  5/16" 0.3 ML MISC 10 Units by Does not apply route 3 (three) times daily before meals.      Elmore Guise Devices (LANCING DEVICE) MISC     . levocetirizine (XYZAL) 5 MG tablet Take 1 tablet (5 mg total) by mouth every evening. 30 tablet 5  . lisinopril (PRINIVIL,ZESTRIL) 10 MG tablet take 1 tablet by mouth once daily 90 tablet 3  . meclizine (ANTIVERT) 25 MG tablet Take 1 tablet (25 mg total) by mouth 3 (three) times daily as needed for dizziness. 10 tablet 0  . metoprolol succinate (TOPROL-XL) 50 MG 24 hr tablet take 1 tablet by mouth once daily with food OR IMMEDIATELY FOLLOWING A MEAL 90 tablet 1  . Multiple Vitamin (MULTIVITAMIN WITH MINERALS) TABS Take 1 tablet by mouth daily.    [name] NOVOLIN N RELION 100 UNIT/ML injection Inject 40 Units into the skin at bedtime.     [name] NOVOLIN R RELION 100 UNIT/ML injection Inject 30 Units into the skin 3 (three) times daily.    [name] nystatin (MYCOSTATIN) 100000 UNIT/ML suspension Swish and spit 5 mls QID prn 150 mL 0  . omega-3 acid ethyl esters (LOVAZA) 1 G capsule Take 2 capsules (2 g total) by mouth 2 (two) times daily. (Patient taking differently: Take 1 g by mouth daily. ) 120 capsule 5  . ondansetron (ZOFRAN) 4 MG tablet Take 1 tablet (4 mg total) by mouth every 6 (six) hours. 12 tablet 0  . PHARMACIST CHOICE LANCETS MISC     . vitamin E 100 UNIT capsule Take by mouth daily.     No current facility-administered medications on file prior to visit.    BP 124/76 mmHg  Pulse 88  Temp(Src) 98.1 F (36.7 C) (Oral)  Ht _0  (1.651 m)  Wt 180 lb 9.6 oz (81.92 kg)  BMI 30.05 kg/m2  SpO2 98%       Objective:   Physical Exam   General- No acute distress. Pleasant patient. Neck- Full range of motion, no jvd Lungs- Clear, even and unlabored. Heart- regular rate and rhythm. Neurologic- CNII- XII grossly intact.  Lower ext- 1 + pedal edema both lower ext pretibial down to feet. Calfs symmetric. Negative homans signs        Assessment & Plan:  For  your weight gain and edema of legs this may be dependant edema. But I want you to get cxr and labs to see if any other cause such as chf.   Elevate legs daily. Also may benefit from compresiion  stockings.  Will include on labs bnp which assess for chf.  For fatigue include cbc and cmp.  If this persist may consider occasional low dose diuretic.  Follow up in 7 days or as needed

## 2015-12-29 NOTE — Progress Notes (Signed)
Pre visit review using our clinic review tool, if applicable. No additional management support is needed unless otherwise documented below in the visit note. 

## 2015-12-30 LAB — CBC WITH DIFFERENTIAL/PLATELET
BASOS ABS: 0 10*3/uL (ref 0.0–0.1)
BASOS PCT: 0.5 % (ref 0.0–3.0)
EOS ABS: 0.1 10*3/uL (ref 0.0–0.7)
Eosinophils Relative: 1.8 % (ref 0.0–5.0)
HEMATOCRIT: 36.2 % (ref 36.0–46.0)
HEMOGLOBIN: 12 g/dL (ref 12.0–15.0)
LYMPHS PCT: 48.8 % — AB (ref 12.0–46.0)
Lymphs Abs: 4 10*3/uL (ref 0.7–4.0)
MCHC: 33.2 g/dL (ref 30.0–36.0)
MCV: 87.6 fl (ref 78.0–100.0)
MONOS PCT: 6.4 % (ref 3.0–12.0)
Monocytes Absolute: 0.5 10*3/uL (ref 0.1–1.0)
Neutro Abs: 3.5 10*3/uL (ref 1.4–7.7)
Neutrophils Relative %: 42.5 % — ABNORMAL LOW (ref 43.0–77.0)
Platelets: 165 10*3/uL (ref 150.0–400.0)
RBC: 4.13 Mil/uL (ref 3.87–5.11)
RDW: 13.7 % (ref 11.5–15.5)
WBC: 8.2 10*3/uL (ref 4.0–10.5)

## 2015-12-30 LAB — COMPREHENSIVE METABOLIC PANEL
ALBUMIN: 4.4 g/dL (ref 3.5–5.2)
ALT: 20 U/L (ref 0–35)
AST: 19 U/L (ref 0–37)
Alkaline Phosphatase: 49 U/L (ref 39–117)
BILIRUBIN TOTAL: 0.8 mg/dL (ref 0.2–1.2)
BUN: 19 mg/dL (ref 6–23)
CALCIUM: 9.7 mg/dL (ref 8.4–10.5)
CO2: 25 mEq/L (ref 19–32)
CREATININE: 0.77 mg/dL (ref 0.40–1.20)
Chloride: 104 mEq/L (ref 96–112)
GFR: 95.32 mL/min (ref >60.00)
Glucose, Bld: 230 mg/dL — ABNORMAL HIGH (ref 70–99)
Potassium: 3.7 mEq/L (ref 3.5–5.1)
Sodium: 137 mEq/L (ref 135–145)
Total Protein: 7.3 g/dL (ref 6.0–8.3)

## 2015-12-30 LAB — BRAIN NATRIURETIC PEPTIDE: PRO B NATRI PEPTIDE: 12 pg/mL (ref 0.0–100.0)

## 2016-01-17 ENCOUNTER — Other Ambulatory Visit: Payer: Self-pay | Admitting: Family Medicine

## 2016-01-17 NOTE — Telephone Encounter (Signed)
Medication filled to pharmacy as requested.   

## 2016-02-10 ENCOUNTER — Other Ambulatory Visit: Payer: Self-pay | Admitting: Emergency Medicine

## 2016-02-10 MED ORDER — AMLODIPINE BESYLATE 10 MG PO TABS: 10.0000 mg | ORAL_TABLET | Freq: Every day | ORAL | Status: DC

## 2016-02-10 MED ORDER — LISINOPRIL 10 MG PO TABS: 10.0000 mg | ORAL_TABLET | Freq: Every day | ORAL | Status: DC

## 2016-02-18 ENCOUNTER — Encounter (HOSPITAL_BASED_OUTPATIENT_CLINIC_OR_DEPARTMENT_OTHER): Payer: Self-pay

## 2016-02-18 ENCOUNTER — Emergency Department (HOSPITAL_BASED_OUTPATIENT_CLINIC_OR_DEPARTMENT_OTHER): Payer: Commercial Managed Care - HMO

## 2016-02-18 ENCOUNTER — Observation Stay (HOSPITAL_BASED_OUTPATIENT_CLINIC_OR_DEPARTMENT_OTHER)
Admission: EM | Admit: 2016-02-18 | Discharge: 2016-02-19 | Disposition: A | Discharge status: Home / Self Care | Payer: Commercial Managed Care - HMO | Attending: Internal Medicine | Admitting: Internal Medicine

## 2016-02-18 DIAGNOSIS — F32A Depression, unspecified: Secondary | ICD-10-CM | POA: Diagnosis present

## 2016-02-18 DIAGNOSIS — I10 Essential (primary) hypertension: Secondary | ICD-10-CM

## 2016-02-18 DIAGNOSIS — R079 Chest pain, unspecified: Secondary | ICD-10-CM | POA: Diagnosis present

## 2016-02-18 DIAGNOSIS — Z955 Presence of coronary angioplasty implant and graft: Secondary | ICD-10-CM | POA: Insufficient documentation

## 2016-02-18 DIAGNOSIS — Z87891 Personal history of nicotine dependence: Secondary | ICD-10-CM | POA: Insufficient documentation

## 2016-02-18 DIAGNOSIS — E785 Hyperlipidemia, unspecified: Secondary | ICD-10-CM | POA: Diagnosis not present

## 2016-02-18 DIAGNOSIS — Z794 Long term (current) use of insulin: Secondary | ICD-10-CM | POA: Insufficient documentation

## 2016-02-18 DIAGNOSIS — M542 Cervicalgia: Secondary | ICD-10-CM | POA: Diagnosis not present

## 2016-02-18 DIAGNOSIS — F329 Major depressive disorder, single episode, unspecified: Secondary | ICD-10-CM

## 2016-02-18 DIAGNOSIS — R6884 Jaw pain: Secondary | ICD-10-CM | POA: Diagnosis present

## 2016-02-18 DIAGNOSIS — I2510 Atherosclerotic heart disease of native coronary artery without angina pectoris: Secondary | ICD-10-CM | POA: Insufficient documentation

## 2016-02-18 DIAGNOSIS — J9811 Atelectasis: Secondary | ICD-10-CM | POA: Diagnosis not present

## 2016-02-18 DIAGNOSIS — E118 Type 2 diabetes mellitus with unspecified complications: Secondary | ICD-10-CM | POA: Diagnosis not present

## 2016-02-18 DIAGNOSIS — R61 Generalized hyperhidrosis: Secondary | ICD-10-CM | POA: Diagnosis not present

## 2016-02-18 DIAGNOSIS — R0600 Dyspnea, unspecified: Secondary | ICD-10-CM | POA: Diagnosis present

## 2016-02-18 DIAGNOSIS — E1165 Type 2 diabetes mellitus with hyperglycemia: Secondary | ICD-10-CM | POA: Diagnosis not present

## 2016-02-18 DIAGNOSIS — Z720 Tobacco use: Secondary | ICD-10-CM | POA: Diagnosis present

## 2016-02-18 DIAGNOSIS — IMO0002 Reserved for concepts with insufficient information to code with codable children: Secondary | ICD-10-CM | POA: Diagnosis present

## 2016-02-18 DIAGNOSIS — G2581 Restless legs syndrome: Secondary | ICD-10-CM | POA: Diagnosis present

## 2016-02-18 DIAGNOSIS — R0602 Shortness of breath: Secondary | ICD-10-CM | POA: Diagnosis present

## 2016-02-18 DIAGNOSIS — Z79899 Other long term (current) drug therapy: Secondary | ICD-10-CM | POA: Insufficient documentation

## 2016-02-18 DIAGNOSIS — K859 Acute pancreatitis without necrosis or infection, unspecified: Secondary | ICD-10-CM | POA: Diagnosis present

## 2016-02-18 LAB — CBC WITH DIFFERENTIAL/PLATELET
BASOS ABS: 0 10*3/uL (ref 0.0–0.1)
BASOS ABS: 0 10*3/uL (ref 0.0–0.1)
BASOS PCT: 0 %
BASOS PCT: 0 %
Eosinophils Absolute: 0.1 10*3/uL (ref 0.0–0.7)
Eosinophils Absolute: 0.1 10*3/uL (ref 0.0–0.7)
Eosinophils Relative: 1 %
Eosinophils Relative: 1 %
HEMATOCRIT: 40.6 % (ref 36.0–46.0)
HEMATOCRIT: 37 % (ref 36.0–46.0)
HEMOGLOBIN: 14 g/dL (ref 12.0–15.0)
Hemoglobin: 12.2 g/dL (ref 12.0–15.0)
LYMPHS PCT: 34 %
LYMPHS PCT: 39 %
Lymphs Abs: 3.4 10*3/uL (ref 0.7–4.0)
Lymphs Abs: 3.3 10*3/uL (ref 0.7–4.0)
MCH: 30 pg (ref 26.0–34.0)
MCH: 28.8 pg (ref 26.0–34.0)
MCHC: 34.5 g/dL (ref 30.0–36.0)
MCHC: 33 g/dL (ref 30.0–36.0)
MCV: 87.1 fL (ref 78.0–100.0)
MCV: 87.3 fL (ref 78.0–100.0)
MONOS PCT: 8 %
Monocytes Absolute: 0.8 10*3/uL (ref 0.1–1.0)
Monocytes Absolute: 0.4 10*3/uL (ref 0.1–1.0)
Monocytes Relative: 5 %
NEUTROS ABS: 5.5 10*3/uL (ref 1.7–7.7)
NEUTROS ABS: 4.7 10*3/uL (ref 1.7–7.7)
NEUTROS PCT: 57 %
NEUTROS PCT: 55 %
Platelets: 184 10*3/uL (ref 150–400)
Platelets: 182 10*3/uL (ref 150–400)
RBC: 4.66 MIL/uL (ref 3.87–5.11)
RBC: 4.24 MIL/uL (ref 3.87–5.11)
RDW: 12.8 % (ref 11.5–15.5)
RDW: 12.9 % (ref 11.5–15.5)
WBC: 9.8 10*3/uL (ref 4.0–10.5)
WBC: 8.6 10*3/uL (ref 4.0–10.5)

## 2016-02-18 LAB — COMPREHENSIVE METABOLIC PANEL
ALBUMIN: 3.7 g/dL (ref 3.5–5.0)
ALK PHOS: 50 U/L (ref 38–126)
ALT: 24 U/L (ref 14–54)
ANION GAP: 8 (ref 5–15)
AST: 19 U/L (ref 15–41)
BILIRUBIN TOTAL: 1.2 mg/dL (ref 0.3–1.2)
BUN: 14 mg/dL (ref 6–20)
CALCIUM: 9.6 mg/dL (ref 8.9–10.3)
CO2: 26 mmol/L (ref 22–32)
Chloride: 102 mmol/L (ref 101–111)
Creatinine, Ser: 0.68 mg/dL (ref 0.44–1.00)
GFR calc non Af Amer: >60 mL/min (ref >60)
Glucose, Bld: 226 mg/dL — ABNORMAL HIGH (ref 65–99)
POTASSIUM: 3.7 mmol/L (ref 3.5–5.1)
SODIUM: 136 mmol/L (ref 135–145)
TOTAL PROTEIN: 6.6 g/dL (ref 6.5–8.1)

## 2016-02-18 LAB — BASIC METABOLIC PANEL
ANION GAP: 9 (ref 5–15)
BUN: 18 mg/dL (ref 6–20)
CHLORIDE: 102 mmol/L (ref 101–111)
CO2: 26 mmol/L (ref 22–32)
Calcium: 10.2 mg/dL (ref 8.9–10.3)
Creatinine, Ser: 0.69 mg/dL (ref 0.44–1.00)
GFR calc non Af Amer: >60 mL/min (ref >60)
GLUCOSE: 303 mg/dL — AB (ref 65–99)
Potassium: 3.6 mmol/L (ref 3.5–5.1)
Sodium: 137 mmol/L (ref 135–145)

## 2016-02-18 LAB — LIPASE, BLOOD: Lipase: 48 U/L (ref 11–51)

## 2016-02-18 LAB — GLUCOSE, CAPILLARY
GLUCOSE-CAPILLARY: 221 mg/dL — AB (ref 65–99)
GLUCOSE-CAPILLARY: 299 mg/dL — AB (ref 65–99)
Glucose-Capillary: 259 mg/dL — ABNORMAL HIGH (ref 65–99)
Glucose-Capillary: 339 mg/dL — ABNORMAL HIGH (ref 65–99)

## 2016-02-18 LAB — TROPONIN I
Troponin I: <0.03 ng/mL (ref <0.031)
Troponin I: <0.03 ng/mL (ref <0.031)

## 2016-02-18 LAB — LIPID PANEL
Cholesterol: 128 mg/dL (ref 0–200)
HDL: 36 mg/dL — ABNORMAL LOW (ref >40)
LDL CALC: 80 mg/dL (ref 0–99)
Total CHOL/HDL Ratio: 3.6 RATIO
Triglycerides: 61 mg/dL (ref <150)
VLDL: 12 mg/dL (ref 0–40)

## 2016-02-18 LAB — RAPID URINE DRUG SCREEN, HOSP PERFORMED
AMPHETAMINES: NOT DETECTED
BENZODIAZEPINES: NOT DETECTED
Barbiturates: NOT DETECTED
COCAINE: NOT DETECTED
OPIATES: POSITIVE — AB
TETRAHYDROCANNABINOL: NOT DETECTED

## 2016-02-18 LAB — HEPARIN LEVEL (UNFRACTIONATED): HEPARIN UNFRACTIONATED: 0.52 [IU]/mL (ref 0.30–0.70)

## 2016-02-18 LAB — CBG MONITORING, ED: GLUCOSE-CAPILLARY: 234 mg/dL — AB (ref 65–99)

## 2016-02-18 LAB — MAGNESIUM: Magnesium: 1.8 mg/dL (ref 1.7–2.4)

## 2016-02-18 LAB — ETHANOL: Alcohol, Ethyl (B): <5 mg/dL (ref <5)

## 2016-02-18 MED ORDER — GABAPENTIN 100 MG PO CAPS
100.0000 mg | ORAL_CAPSULE | Freq: Three times a day (TID) | ORAL | Status: DC
  Administered 2016-02-18 – 2016-02-19 (×3): 100 mg via ORAL
  Filled 2016-02-18 (×3): qty 1

## 2016-02-18 MED ORDER — HEPARIN BOLUS VIA INFUSION
4000.0000 [IU] | Freq: Once | INTRAVENOUS | Status: AC
  Administered 2016-02-18: 4000 [IU] via INTRAVENOUS
  Filled 2016-02-18: qty 4000

## 2016-02-18 MED ORDER — LISINOPRIL 10 MG PO TABS
10.0000 mg | ORAL_TABLET | Freq: Every day | ORAL | Status: DC
Start: 2016-02-18 — End: 2016-02-19
  Administered 2016-02-18 – 2016-02-19 (×2): 10 mg via ORAL
  Filled 2016-02-18 (×2): qty 1

## 2016-02-18 MED ORDER — HEPARIN (PORCINE) IN NACL 100-0.45 UNIT/ML-% IJ SOLN
850.0000 [IU]/h | INTRAMUSCULAR | Status: DC
  Administered 2016-02-18 – 2016-02-19 (×2): 850 [IU]/h via INTRAVENOUS
  Filled 2016-02-18: qty 250

## 2016-02-18 MED ORDER — INSULIN NPH (HUMAN) (ISOPHANE) 100 UNIT/ML ~~LOC~~ SUSP
40.0000 [IU] | Freq: Every day | SUBCUTANEOUS | Status: DC
Start: 2016-02-18 — End: 2016-02-19
  Administered 2016-02-18: 40 [IU] via SUBCUTANEOUS
  Filled 2016-02-18: qty 10

## 2016-02-18 MED ORDER — ATORVASTATIN CALCIUM 40 MG PO TABS
40.0000 mg | ORAL_TABLET | Freq: Every day | ORAL | Status: DC
  Administered 2016-02-18 – 2016-02-19 (×2): 40 mg via ORAL
  Filled 2016-02-18 (×2): qty 1

## 2016-02-18 MED ORDER — LORATADINE 10 MG PO TABS
10.0000 mg | ORAL_TABLET | Freq: Every evening | ORAL | Status: DC
  Administered 2016-02-18 – 2016-02-19 (×2): 10 mg via ORAL
  Filled 2016-02-18 (×2): qty 1

## 2016-02-18 MED ORDER — METOPROLOL SUCCINATE ER 50 MG PO TB24
50.0000 mg | ORAL_TABLET | Freq: Every day | ORAL | Status: DC
  Administered 2016-02-18 – 2016-02-19 (×2): 50 mg via ORAL
  Filled 2016-02-18 (×2): qty 1

## 2016-02-18 MED ORDER — ASPIRIN 81 MG PO CHEW
324.0000 mg | CHEWABLE_TABLET | Freq: Once | ORAL | Status: AC
  Administered 2016-02-18: 324 mg via ORAL
  Filled 2016-02-18: qty 4

## 2016-02-18 MED ORDER — AMLODIPINE BESYLATE 10 MG PO TABS
10.0000 mg | ORAL_TABLET | Freq: Every day | ORAL | Status: DC
  Administered 2016-02-18 – 2016-02-19 (×2): 10 mg via ORAL
  Filled 2016-02-18 (×2): qty 1

## 2016-02-18 MED ORDER — INSULIN ASPART 100 UNIT/ML ~~LOC~~ SOLN
0.0000 [IU] | SUBCUTANEOUS | Status: DC
  Administered 2016-02-18 (×2): 5 [IU] via SUBCUTANEOUS
  Administered 2016-02-18: 15 [IU] via SUBCUTANEOUS
  Administered 2016-02-19: 11 [IU] via SUBCUTANEOUS
  Administered 2016-02-19: 2 [IU] via SUBCUTANEOUS

## 2016-02-18 MED ORDER — ATORVASTATIN CALCIUM 40 MG PO TABS: 40.0000 mg | ORAL_TABLET | Freq: Every day | ORAL | Status: DC

## 2016-02-18 MED ORDER — HYDROCODONE-ACETAMINOPHEN 5-325 MG PO TABS: 1.0000 | ORAL_TABLET | Freq: Four times a day (QID) | ORAL | Status: DC | PRN

## 2016-02-18 MED ORDER — OMEGA-3-ACID ETHYL ESTERS 1 G PO CAPS
1.0000 g | ORAL_CAPSULE | Freq: Every day | ORAL | Status: DC
  Administered 2016-02-18 – 2016-02-19 (×2): 1 g via ORAL
  Filled 2016-02-18 (×2): qty 1

## 2016-02-18 MED ORDER — VITAMIN E 45 MG (100 UNIT) PO CAPS
100.0000 [IU] | ORAL_CAPSULE | Freq: Every day | ORAL | Status: DC
  Administered 2016-02-18: 100 [IU] via ORAL
  Filled 2016-02-18 (×2): qty 1

## 2016-02-18 MED ORDER — ALPRAZOLAM 0.25 MG PO TABS: 0.2500 mg | ORAL_TABLET | Freq: Three times a day (TID) | ORAL | Status: DC | PRN

## 2016-02-18 MED ORDER — BUPROPION HCL ER (SR) 150 MG PO TB12
150.0000 mg | ORAL_TABLET | Freq: Every day | ORAL | Status: DC
  Administered 2016-02-18 – 2016-02-19 (×2): 150 mg via ORAL
  Filled 2016-02-18 (×2): qty 1

## 2016-02-18 MED ORDER — MORPHINE SULFATE (PF) 2 MG/ML IV SOLN: 2.0000 mg | INTRAVENOUS | Status: DC | PRN

## 2016-02-18 MED ORDER — ASPIRIN EC 81 MG PO TBEC
81.0000 mg | DELAYED_RELEASE_TABLET | Freq: Every day | ORAL | Status: DC
Start: 2016-02-18 — End: 2016-02-19
  Administered 2016-02-19: 81 mg via ORAL
  Filled 2016-02-18: qty 1

## 2016-02-18 MED ORDER — ONDANSETRON HCL 4 MG/2ML IJ SOLN: 4.0000 mg | Freq: Four times a day (QID) | INTRAMUSCULAR | Status: DC | PRN

## 2016-02-18 MED ORDER — ACETAMINOPHEN 325 MG PO TABS: 650.0000 mg | ORAL_TABLET | ORAL | Status: DC | PRN

## 2016-02-18 MED ORDER — FLUTICASONE PROPIONATE 50 MCG/ACT NA SUSP
2.0000 | Freq: Every day | NASAL | Status: DC
  Filled 2016-02-18 (×2): qty 16

## 2016-02-18 NOTE — Plan of Care (Signed)
Problem: Phase I Progression Outcomes Goal: Hemodynamically stable Outcome: Progressing VSS. Denies weakness, SOB or Chest Pain.

## 2016-02-18 NOTE — ED Notes (Signed)
[facility] at bedside, transfer paperwork provided.

## 2016-02-18 NOTE — Progress Notes (Signed)
ANTICOAGULATION CONSULT NOTE - Initial Consult  Pharmacy Consult for heparin Indication: chest pain/ACS  Allergies  Allergen Reactions  . Levemir [Insulin Detemir] Hives and Swelling  . Metformin And Related Other (See Comments)    Sick on stomach, sweaty, throw up, pain    Patient Measurements: Height: 5\' 5"  (165.1 cm) Weight: 170 lb 6.4 oz (77.293 kg) IBW/kg (Calculated) : 57 Heparin Dosing Weight: 73 kg  Vital Signs: Temp: 97.6 F (36.4 C) (05/13 0917) Temp Source: Oral (05/13 0917) BP: 139/58 mmHg (05/13 0917) Pulse Rate: 56 (05/13 0917)  Labs:  Recent Labs  02/18/16 0520  HGB 14.0  HCT 40.6  PLT 184  CREATININE 0.69  TROPONINI <0.03    Estimated Creatinine Clearance: 67.2 mL/min (by C-G formula based on Cr of 0.69).   Medical History: Past Medical History  Diagnosis Date  . Diabetes mellitus   . Hypertension   . Hyperlipidemia   . Depression   . Restless leg syndrome   . Pancreatitis   . Blockage of coronary artery of heart (HCC)     Medications:  Scheduled:    Assessment: 70 yo woman admitted 02/18/2016 for ACS r/o with neck & jaw pain, SOB. Endorsed N/V, diaphoresis prior to presenting. Pharmacy consulted to dose heparin.  PMH HTN, CAD s/p PCI (2001), HLD, DM2, RLS, pancreatitis, depression  CBC wnl, no heparin given in ED. Will bolus and start drip.   Goal of Therapy:  Heparin level 0.3-0.7 units/ml Monitor platelets by anticoagulation protocol: Yes   Plan:  Heparin 4000 units x 1 Heparin 850 units/h 1830 HL Daily HL, CBC Monitor s/sx bleeding   Hillery AldoElizabeth Tniyah Nakagawa, Pharm.D., BCPS PGY2 Cardiology Pharmacy Resident Pager: [phone]  02/18/2016,9:47 AM

## 2016-02-18 NOTE — ED Notes (Signed)
CBG reassessed secondary to blood glucose level on lab values. EDP informed of blood glucose results from blood work

## 2016-02-18 NOTE — Progress Notes (Signed)
ANTICOAGULATION CONSULT NOTE  Pharmacy Consult for heparin Indication: chest pain/ACS  Allergies  Allergen Reactions  . Levemir [Insulin Detemir] Hives and Swelling  . Metformin And Related Other (See Comments)    Sick on stomach, sweaty, throw up, pain    Patient Measurements: Height: 5\' 5"  (165.1 cm) Weight: 170 lb 6.4 oz (77.293 kg) IBW/kg (Calculated) : 57 Heparin Dosing Weight: 73 kg  Vital Signs: Temp: 98.2 F (36.8 C) (05/13 1453) Temp Source: Oral (05/13 1453) BP: 160/61 mmHg (05/13 1453) Pulse Rate: 64 (05/13 1453)  Labs:  Recent Labs  02/18/16 0520 02/18/16 1001 02/18/16 1209 02/18/16 1520 02/18/16 2023  HGB 14.0 12.2  --   --   --   HCT 40.6 37.0  --   --   --   PLT 184 182  --   --   --   HEPARINUNFRC  --   --   --   --  0.52  CREATININE 0.69 0.68  --   --   --   TROPONINI <0.03 <0.03 <0.03 <0.03  --     Estimated Creatinine Clearance: 67.2 mL/min (by C-G formula based on Cr of 0.68).   Assessment: 70 yo woman admitted 02/18/2016 for ACS r/o with neck & jaw pain, SOB. Endorsed N/V, diaphoresis prior to presenting. Pharmacy consulted to dose heparin.  PMH HTN, CAD s/p PCI (2001), HLD, DM2, RLS, pancreatitis, depression  First heparin level therapeutic at 0.52 units/mL. No bleeding noted. CBC stable.  Goal of Therapy:  Heparin level 0.3-0.7 units/ml Monitor platelets by anticoagulation protocol: Yes   Plan:  Heparin 850 units/h3 Confirmatory HL with AM labs Daily HL, CBC Monitor s/sx bleeding  Hydee Fleece D. Thomasene Dubow, PharmD, BCPS Clinical Pharmacist Pager: [phone] 02/18/2016 8:55 PM

## 2016-02-18 NOTE — ED Notes (Signed)
Phone Hand Off Report given to [facility]

## 2016-02-18 NOTE — ED Notes (Signed)
Pt c/o pain to lt side of neck up to lt jaw with n/v/diaphoresis; states took a hydrocodone with pain decreasing; pt a/o x4; denies chest pain or SOB

## 2016-02-18 NOTE — H&P (Signed)
[facility] History and Physical  MAESYN FRISINGER OTL:[id number] DOB: [birth year] DOA: 02/18/2016  Referring physician:  PCP: Ann Held, DO   Chief Complaint: Neck pain and SOB  HPI: [name] is a 70 y.o. BF PMHx  HTN, CAD native artery,HLD, DM  Type  II uncontrolled with complications,RLS, pancreatitis(medication induced),non-compliant with diabetic diet, Tobacco abuse, Left Rotator Cuff Tear  Who presents to the emergency department with complaints of left-sided neck and jaw pain that started just prior to arrival. She did have associated shortness of breath that she describes as a "struggle to breathe". No chest pain or chest discomfort. Did have nausea, vomiting and diaphoresis. Symptoms lasted~ 5 hours. Took a hydrocodone at home and now her pain is gone. Continued residual N/fatigue.[name] Has never had similar symptoms. She does have a history of CAD with a stent placed in 2001. Reports at that time she had a routine stress test which was abnormal requiring her to have a cardiac catheterization. She never had symptoms at that time. No history of PE or DVT. No fever or cough. No lower extremity swelling or pain. No dental pain, pain with opening her mouth, chewing, ear pain.   Review of Systems:  Constitutional:  No weight loss, night sweats, Fevers, chills, .  HEENT:  No headaches, Difficulty swallowing,Tooth/dental problems,Sore throat,  No sneezing, itching, ear ache, nasal congestion, post nasal drip,  Cardio-vascular:  No chest pain, Orthopnea, PND, swelling in lower extremities, anasarca, dizziness, palpitations, positive neck/jaw pain  GI:  No heartburn, indigestion, abdominal pain, nausea, vomiting, diarrhea, change in bowel habits, loss of appetite  Resp:  No shortness of breath with exertion or at rest. No excess mucus, no productive cough, No non-productive cough, No coughing up of blood.No change in color of mucus.No wheezing.No chest wall deformity    Skin:  no rash or lesions.  GU:  no dysuria, change in color of urine, no urgency or frequency. No flank pain.  Musculoskeletal:  No joint pain or swelling. No decreased range of motion. No back pain.  Psych:  No change in mood or affect. No depression or anxiety. No memory loss.   Past Medical History  Diagnosis Date  . Diabetes mellitus   . Hypertension   . Hyperlipidemia   . Depression   . Restless leg syndrome   . Pancreatitis   . Blockage of coronary artery of heart [facility])    Past Surgical History  Procedure Laterality Date  . Tubal ligation    . Rotator cuff repair      right  . Coronary angioplasty with stent placement    . Foot surgery Right     cyst removal   Social History: positive smokes 1 PPW, negative EtOH, negative drugs  Allergies  Allergen Reactions  . Levemir [Insulin Detemir] Hives and Swelling  . Metformin And Related Other (See Comments)    Sick on stomach, sweaty, throw up, pain    Family History  Problem Relation Age of Onset  . Lung cancer father    . Breast cancer sister    . Thyroid cancer    . Uterine cancer mother    . Cancer Other     MI brother age 68, dad age 59,     Diabetes sisters 2    HLD mother, father, sister    Prior to Admission medications   Medication Sig Start Date End Date Taking? Authorizing Provider  alendronate (FOSAMAX) 70 MG tablet take 1 tablet by mouth  every week on an empty stomach with 6-8 oz of water. No food / meds for 30 min. Remain Upright 07/04/15   Rosalita Chessman Chase, DO  ALPRAZolam Duanne Moron) 0.25 MG tablet take 1 tablet by mouth three times a day if needed 09/23/15   Rosalita Chessman Chase, DO  amLODipine (NORVASC) 10 MG tablet Take 1 tablet (10 mg total) by mouth daily. 02/10/16   Rosalita Chessman Chase, DO  aspirin EC 81 MG tablet Take 81 mg by mouth daily.    Historical Provider, MD  atorvastatin (LIPITOR) 40 MG tablet take 1 tablet by mouth once daily 01/17/16   Rosalita Chessman Chase, DO  Blood Glucose  Calibration ([facility] CONTROL) NORMAL SOLN  10/19/12   Historical Provider, MD  Blood Glucose Monitoring Suppl (ACCU-CHEK AVIVA PLUS) w/Device KIT Check blood sugar daily. E11.9 11/25/15   Alferd Apa Lowne Chase, DO  CINNAMON PO Take by mouth.    Historical Provider, MD  Flaxseed, Linseed, (FLAXSEED OIL PO) Take 1 tablet by mouth daily.    Historical Provider, MD  FLUoxetine (PROZAC) 20 MG capsule take 1 capsule by mouth once daily 09/22/15   Alferd Apa Lowne Chase, DO  fluticasone [facility]) 50 MCG/ACT nasal spray Place 2 sprays into both nostrils daily. 11/14/15   Rosalita Chessman Chase, DO  gabapentin (NEURONTIN) 100 MG capsule Take 100 mg by mouth. 06/09/15   Historical Provider, MD  Evelina Bucy BILOBA COMPLEX PO Take by mouth.    Historical Provider, MD  glucose blood (ACCU-CHEK AVIVA) test strip Check blood sugar four times daily. E11.9 12/14/14   Rosalita Chessman Chase, DO  HYDROcodone-acetaminophen (NORCO) 5-325 MG tablet Take 1 tablet by mouth every 6 (six) hours as needed for moderate pain. 11/14/15   Rosalita Chessman Chase, DO  Insulin Pen Needle (PEN NEEDLES 31GX5/16") 31G X 8 MM MISC by Does not apply route daily.      Historical Provider, MD  Insulin Syringe-Needle U-100 (BD INSULIN SYRINGE ULTRAFINE) 31G X 5/16" 0.3 ML MISC 10 Units by Does not apply route 3 (three) times daily before meals.      Historical Provider, MD  Lancet Devices (LANCING DEVICE) MISC  10/19/12   Historical Provider, MD  levocetirizine (XYZAL) 5 MG tablet Take 1 tablet (5 mg total) by mouth every evening. 11/14/15   Rosalita Chessman Chase, DO  lisinopril (PRINIVIL,ZESTRIL) 10 MG tablet Take 1 tablet (10 mg total) by mouth daily. 02/10/16   Rosalita Chessman Chase, DO  meclizine (ANTIVERT) 25 MG tablet Take 1 tablet (25 mg total) by mouth 3 (three) times daily as needed for dizziness. 12/31/14   Hanna Patel-Mills, PA-C  metoprolol succinate (TOPROL-XL) 50 MG 24 hr tablet take 1 tablet by mouth once daily with food OR IMMEDIATELY FOLLOWING A MEAL  12/13/15   Rosalita Chessman Chase, DO  Multiple Vitamin (MULTIVITAMIN WITH MINERALS) TABS Take 1 tablet by mouth daily.    Historical Provider, MD  NOVOLIN N RELION 100 UNIT/ML injection Inject 40 Units into the skin at bedtime.  05/05/12   Historical Provider, MD  NOVOLIN R RELION 100 UNIT/ML injection Inject 30 Units into the skin 3 (three) times daily. 05/29/12   Historical Provider, MD  nystatin (MYCOSTATIN) 100000 UNIT/ML suspension Swish and spit 5 mls QID prn 11/22/15   Rosalita Chessman Chase, DO  omega-3 acid ethyl esters (LOVAZA) 1 G capsule Take 2 capsules (2 g total) by mouth 2 (two) times daily. Patient taking differently: Take  1 g by mouth daily.  09/17/14   Alferd Apa Lowne Chase, DO  ondansetron (ZOFRAN) 4 MG tablet Take 1 tablet (4 mg total) by mouth every 6 (six) hours. 12/31/14   Ottie Glazier, PA-C  PHARMACIST CHOICE LANCETS MISC  10/31/13   Historical Provider, MD  vitamin E 100 UNIT capsule Take by mouth daily.    Historical Provider, MD     Consultants:  NA  Procedures/Significant Events:  Echocardiogram pending  Cultures NA  Antimicrobials: NA   Devices NA   LINES / TUBES:  NA    Continuous Infusions:  Physical Exam: Filed Vitals:   02/18/16 0700 02/18/16 0730 02/18/16 0800 02/18/16 0917  BP: 104/58 124/56 112/57 139/58  Pulse: 56 42 54 56  Temp:    97.6 F (36.4 C)  TempSrc:    Oral  Resp: '14 16 19   ' Height:    '5\' 5"'  (1.651 m)  Weight:    77.293 kg (170 lb 6.4 oz)  SpO2: 100% 100% 100% 100%    Wt Readings from Last 3 Encounters:  02/18/16 77.293 kg (170 lb 6.4 oz)  12/29/15 81.92 kg (180 lb 9.6 oz)  11/14/15 78.109 kg (172 lb 3.2 oz)    General: A/O 4, NAD, No acute respiratory distress Eyes: negative scleral hemorrhage, negative anisocoria, negative icterus* ENT: Negative Runny nose, negative gingival bleeding, Neck:  Negative scars, masses, torticollis, lymphadenopathy, JVD Lungs: Clear to auscultation bilaterally without wheezes or  crackles Cardiovascular: Regular rate and rhythm without murmur gallop or rub normal S1 and S2 Abdomen: morbidly obese,negative abdominal pain, nondistended, positive soft, bowel sounds, no rebound, no ascites, no appreciable mass Extremities: No significant cyanosis, clubbing, or edema bilateral lower extremities Skin: Negative rashes, lesions, ulcers Psychiatric:  Negative depression, negative anxiety, negative fatigue, negative mania  Central nervous system:  Cranial nerves II through XII intact, tongue/uvula midline, all extremities muscle strength 5/5, sensation intact throughout,negative dysarthria, negative expressive aphasia, negative receptive aphasia.        Labs on Admission:  Basic Metabolic Panel:  Recent Labs Lab 02/18/16 0520 02/18/16 1001  NA 137 136  K 3.6 3.7  CL 102 102  CO2 26 26  GLUCOSE 303* 226*  BUN 18 14  CREATININE 0.69 0.68  CALCIUM 10.2 9.6  MG  --  1.8   Liver Function Tests:  Recent Labs Lab 02/18/16 1001  AST 19  ALT 24  ALKPHOS 50  BILITOT 1.2  PROT 6.6  ALBUMIN 3.7   No results for input(s): LIPASE, AMYLASE in the last 168 hours. No results for input(s): AMMONIA in the last 168 hours. CBC:  Recent Labs Lab 02/18/16 0520 02/18/16 1001  WBC 9.8 8.6  NEUTROABS 5.5 4.7  HGB 14.0 12.2  HCT 40.6 37.0  MCV 87.1 87.3  PLT 184 182   Cardiac Enzymes:  Recent Labs Lab 02/18/16 0520 02/18/16 1001  TROPONINI <0.03 <0.03    BNP (last 3 results) No results for input(s): BNP in the last 8760 hours.  ProBNP (last 3 results)  Recent Labs  12/29/15 1921  PROBNP 12.0    CBG:  Recent Labs Lab 02/18/16 0800 02/18/16 0923 02/18/16 1124  GLUCAP 234* 221* 259*    Radiological Exams on Admission: Dg Chest 2 View  02/18/2016  CLINICAL DATA:  LEFT neck and jaw pain for 6 hours, nausea, vomiting and diaphoresis. History of hypertension, diabetes. EXAM: CHEST  2 VIEW COMPARISON:  Chest radiograph December 29, 2015 FINDINGS:  Cardiomediastinal silhouette is normal. Mild calcific  atherosclerosis of the aortic knob. Mild bibasilar linear densities. The lungs are otherwise clear without pleural effusions or focal consolidations. Trachea projects midline and there is no pneumothorax. Soft tissue planes and included osseous structures are non-suspicious. Mild degenerative change of thoracic spine. IMPRESSION: Mild bibasilar atelectasis. Electronically Signed   By: Elon Alas M.D.   On: 02/18/2016 05:56    EKG: compared to EKG 09/29/2015 no significant change. NSR  With occasional atrial premature complex, RBBB  Assessment/Plan Active Problems:   Diabetes mellitus type II, uncontrolled (HCC)   Depression   Essential hypertension   Dyspnea   Chest pain   HLD (hyperlipidemia)   CAD in native artery   RLS (restless legs syndrome)   Pancreatitis   SOB (shortness of breath)   Uncontrolled type 2 diabetes mellitus with complication (HCC)   Tobacco abuse  Chest pain/SOB -patient with HEART score 4+ -Obtain echocardiogram -Cycle troponins -EKG when compared to EKG 09/29/2015 no significant change  Essential hypertension -within ADA guidelines -continue home Amlodipine 10 mg daily;; suspect patient has CHF if confirmed by echocardiogram  Will adjust medication  appropriately  CAD native artery -S/P stent placement 2001 patient has not been compliant with cholesterol/diabetic goals of care. -Counseled patient on sequela of continuing to  Not be compliant with DM/Cholesterol/moking guidelines to include MI, CVA, death  HLD -Lipitor 40 mg daily -lipid panel pending  Diabetes type 2 uncontrolled with complications -2/6 hemoglobin A1c=11.1 -counseled patient on sequela of continuing to not be compliant with DM guidelines to include death. -NPH 40 units QHS -Moderate SSI  Tobacco  Abuse -Patient willing to try willing to try Wellbutrin  150 mg daily 3 days then increase to BID  Depression -DC  Prozac -See tobacco abuse   Code Status: Full (DVT Prophylaxis: Heparin drip Family Communication: multiple family members  Present   Disposition Plan: resolution CP workup    Data Reviewed: Care during the described time interval was provided by me .  I have reviewed this patient's available data, including medical history, events of note, physical examination, and all test results as part of my evaluation. I have personally reviewed and interpreted all radiology studies.  Time spent: 60 min  [place], [facility] Pager [phone]

## 2016-02-18 NOTE — ED Provider Notes (Signed)
TIME SEEN: 5:20 AM  CHIEF COMPLAINT: Jaw pain, diaphoresis  HPI: Pt is a 70 y.o. female with history of hypertension, hyperlipidemia, insulin-dependent diabetes who presents to the emergency department with complaints of left-sided neck and jaw pain that started just prior to arrival. She did have associated shortness of breath that she describes as a "struggle to breathe". No chest pain or chest discomfort. Did have nausea, vomiting and diaphoresis. Took a hydrocodone at home and now her pain is gone. Has never had similar symptoms. She does have a history of CAD with a stent placed in 2001. Reports at that time she had a routine stress test which was abnormal requiring her to have a cardiac catheterization. She never had symptoms at that time. No history of PE or DVT. No fever or cough. No lower extremity swelling or pain. No dental pain, pain with opening her mouth, chewing, ear pain.   ROS: See HPI Constitutional: no fever  Eyes: no drainage  ENT: no runny nose   Cardiovascular:  no chest pain  Resp: no SOB  GI: no vomiting GU: no dysuria Integumentary: no rash  Allergy: no hives  Musculoskeletal: no leg swelling  Neurological: no slurred speech ROS otherwise negative  PAST MEDICAL HISTORY/PAST SURGICAL HISTORY:  Past Medical History  Diagnosis Date  . Diabetes mellitus   . Hypertension   . Hyperlipidemia   . Depression   . Restless leg syndrome   . Pancreatitis   . Blockage of coronary artery of heart (HCC)     MEDICATIONS:  Prior to Admission medications   Medication Sig Start Date End Date Taking? Authorizing Provider  alendronate (FOSAMAX) 70 MG tablet take 1 tablet by mouth every week on an empty stomach with 6-8 oz of water. No food / meds for 30 min. Remain Upright 07/04/15   Rosalita Chessman Chase, DO  ALPRAZolam Duanne Moron) 0.25 MG tablet take 1 tablet by mouth three times a day if needed 09/23/15   Rosalita Chessman Chase, DO  amLODipine (NORVASC) 10 MG tablet Take 1 tablet  (10 mg total) by mouth daily. 02/10/16   Rosalita Chessman Chase, DO  aspirin EC 81 MG tablet Take 81 mg by mouth daily.    Historical Provider, MD  atorvastatin (LIPITOR) 40 MG tablet take 1 tablet by mouth once daily 01/17/16   Rosalita Chessman Chase, DO  Blood Glucose Calibration (TAI [facility] CONTROL) NORMAL SOLN  10/19/12   Historical Provider, MD  Blood Glucose Monitoring Suppl (ACCU-CHEK AVIVA PLUS) w/Device KIT Check blood sugar daily. E11.9 11/25/15   Alferd Apa Lowne Chase, DO  CINNAMON PO Take by mouth.    Historical Provider, MD  Flaxseed, Linseed, (FLAXSEED OIL PO) Take 1 tablet by mouth daily.    Historical Provider, MD  FLUoxetine (PROZAC) 20 MG capsule take 1 capsule by mouth once daily 09/22/15   Alferd Apa Lowne Chase, DO  fluticasone [facility]) 50 MCG/ACT nasal spray Place 2 sprays into both nostrils daily. 11/14/15   Rosalita Chessman Chase, DO  gabapentin (NEURONTIN) 100 MG capsule Take 100 mg by mouth. 06/09/15   Historical Provider, MD  Evelina Bucy BILOBA COMPLEX PO Take by mouth.    Historical Provider, MD  glucose blood (ACCU-CHEK AVIVA) test strip Check blood sugar four times daily. E11.9 12/14/14   Rosalita Chessman Chase, DO  HYDROcodone-acetaminophen (NORCO) 5-325 MG tablet Take 1 tablet by mouth every 6 (six) hours as needed for moderate pain. 11/14/15   Ann Held, DO  Insulin Pen Needle (PEN NEEDLES 31GX5/16") 31G X 8 MM MISC by Does not apply route daily.      Historical Provider, MD  Insulin Syringe-Needle U-100 (BD INSULIN SYRINGE ULTRAFINE) 31G X 5/16" 0.3 ML MISC 10 Units by Does not apply route 3 (three) times daily before meals.      Historical Provider, MD  Lancet Devices (LANCING DEVICE) MISC  10/19/12   Historical Provider, MD  levocetirizine (XYZAL) 5 MG tablet Take 1 tablet (5 mg total) by mouth every evening. 11/14/15   Rosalita Chessman Chase, DO  lisinopril (PRINIVIL,ZESTRIL) 10 MG tablet Take 1 tablet (10 mg total) by mouth daily. 02/10/16   Rosalita Chessman Chase, DO  meclizine (ANTIVERT)  25 MG tablet Take 1 tablet (25 mg total) by mouth 3 (three) times daily as needed for dizziness. 12/31/14   Hanna Patel-Mills, PA-C  metoprolol succinate (TOPROL-XL) 50 MG 24 hr tablet take 1 tablet by mouth once daily with food OR IMMEDIATELY FOLLOWING A MEAL 12/13/15   Rosalita Chessman Chase, DO  Multiple Vitamin (MULTIVITAMIN WITH MINERALS) TABS Take 1 tablet by mouth daily.    Historical Provider, MD  NOVOLIN N RELION 100 UNIT/ML injection Inject 40 Units into the skin at bedtime.  05/05/12   Historical Provider, MD  NOVOLIN R RELION 100 UNIT/ML injection Inject 30 Units into the skin 3 (three) times daily. 05/29/12   Historical Provider, MD  nystatin (MYCOSTATIN) 100000 UNIT/ML suspension Swish and spit 5 mls QID prn 11/22/15   Rosalita Chessman Chase, DO  omega-3 acid ethyl esters (LOVAZA) 1 G capsule Take 2 capsules (2 g total) by mouth 2 (two) times daily. Patient taking differently: Take 1 g by mouth daily.  09/17/14   Alferd Apa Lowne Chase, DO  ondansetron (ZOFRAN) 4 MG tablet Take 1 tablet (4 mg total) by mouth every 6 (six) hours. 12/31/14   Ottie Glazier, PA-C  PHARMACIST CHOICE LANCETS MISC  10/31/13   Historical Provider, MD  vitamin E 100 UNIT capsule Take by mouth daily.    Historical Provider, MD    ALLERGIES:  Allergies  Allergen Reactions  . Levemir [Insulin Detemir] Hives and Swelling  . Metformin And Related Other (See Comments)    Sick on stomach, sweaty, throw up, pain    SOCIAL HISTORY:  Social History  Substance Use Topics  . Smoking status: Former Smoker -- 0.00 packs/day  . Smokeless tobacco: Never Used  . Alcohol Use: No    FAMILY HISTORY: Family History  Problem Relation Age of Onset  . Lung cancer    . Breast cancer    . Thyroid cancer    . Uterine cancer    . Cancer Other     lung,thyroid,breast,uterine    EXAM: BP 157/83 mmHg  Pulse 64  Temp(Src) 98.8 F (37.1 C) (Oral)  Resp 18  Ht '5\' 5"'  (1.651 m)  Wt 170 lb (77.111 kg)  BMI 28.29 kg/m2  SpO2  100% CONSTITUTIONAL: Alert and oriented and responds appropriately to questions. Well-appearing; well-nourished HEAD: Normocephalic EYES: Conjunctivae clear, PERRL ENT: normal nose; no rhinorrhea; moist mucous membranes; No pharyngeal erythema or petechiae, no tonsillar hypertrophy or exudate, no uvular deviation, no trismus or drooling, normal phonation, no stridor, no dental caries or abscess noted, no Ludwig's angina, tongue sits flat in the bottom of the mouth NECK: Supple, no meningismus, no LAD  CARD: RRR; S1 and S2 appreciated; no murmurs, no clicks, no rubs, no gallops RESP: Normal chest excursion without splinting  or tachypnea; breath sounds clear and equal bilaterally; no wheezes, no rhonchi, no rales, no hypoxia or respiratory distress, speaking full sentences ABD/GI: Normal bowel sounds; non-distended; soft, non-tender, no rebound, no guarding, no peritoneal signs BACK:  The back appears normal and is non-tender to palpation, there is no CVA tenderness EXT: Normal ROM in all joints; non-tender to palpation; no edema; normal capillary refill; no cyanosis, no calf tenderness or swelling    SKIN: Normal color for age and race; warm; no rash NEURO: Moves all extremities equally, sensation to light touch intact diffusely, cranial nerves II through XII intact PSYCH: The patient's mood and manner are appropriate. Grooming and personal hygiene are appropriate.  MEDICAL DECISION MAKING: Patient here with concerning story for ACS. EKG shows bifascicular block which is chronic. She is currently pain-free. Will obtain cardiac labs, chest x-ray. I feel she will need admission. Her PCP is Dr. Etter Sjogren.  ED PROGRESS: 6:00 AM  Pt is still asymptomatic. Troponin is negative. Chest x-ray clear. She is hyperglycemic but not in DKA. We'll discuss with hospitalist at [facility] for admission for chest pain rule out. Patient comfortable with this plan.   6:25 AM  Discussed patient's case with hospitalist, Dr.  Loleta Books.  Recommend admission to telemetry, observation bed.  I will place holding orders per their request. Patient and family (if present) updated with plan. Care transferred to hospitalist service.  I reviewed all nursing notes, vitals, pertinent old records, EKGs, labs, imaging (as available).     EKG Interpretation  Date/Time:  Saturday Feb 18 2016 05:09:47 EDT Ventricular Rate:  65 PR Interval:  169 QRS Duration: 145 QT Interval:  459 QTC Calculation: 477 R Axis:   -64 Text Interpretation:  Sinus rhythm Atrial premature complex RBBB and LAFB Anteroseptal infarct, old No significant change since last tracing Confirmed by Sony Schlarb,  DO, Derreon Consalvo ([id number]) on 02/18/2016 5:15:02 AM         [place], DO 02/18/16 [id number]

## 2016-02-19 ENCOUNTER — Other Ambulatory Visit (HOSPITAL_COMMUNITY): Payer: Self-pay

## 2016-02-19 ENCOUNTER — Encounter (HOSPITAL_COMMUNITY): Payer: Self-pay | Admitting: Cardiology

## 2016-02-19 ENCOUNTER — Observation Stay (HOSPITAL_BASED_OUTPATIENT_CLINIC_OR_DEPARTMENT_OTHER): Payer: Commercial Managed Care - HMO

## 2016-02-19 ENCOUNTER — Observation Stay (HOSPITAL_COMMUNITY): Payer: Commercial Managed Care - HMO

## 2016-02-19 ENCOUNTER — Observation Stay (HOSPITAL_COMMUNITY): Payer: Self-pay

## 2016-02-19 DIAGNOSIS — I509 Heart failure, unspecified: Secondary | ICD-10-CM

## 2016-02-19 DIAGNOSIS — R079 Chest pain, unspecified: Secondary | ICD-10-CM | POA: Diagnosis not present

## 2016-02-19 DIAGNOSIS — F329 Major depressive disorder, single episode, unspecified: Secondary | ICD-10-CM | POA: Diagnosis not present

## 2016-02-19 DIAGNOSIS — I10 Essential (primary) hypertension: Secondary | ICD-10-CM | POA: Diagnosis not present

## 2016-02-19 DIAGNOSIS — E785 Hyperlipidemia, unspecified: Secondary | ICD-10-CM | POA: Diagnosis not present

## 2016-02-19 LAB — NM MYOCAR MULTI W/SPECT W/WALL MOTION / EF
CHL CUP RESTING HR STRESS: 54 {beats}/min
CSEPEW: 1 METS
CSEPPHR: 73 {beats}/min
Exercise duration (min): 5 min

## 2016-02-19 LAB — HEPATITIS PANEL, ACUTE
HCV Ab: <0.1 s/co ratio (ref 0.0–0.9)
HEP A IGM: NEGATIVE
HEP B C IGM: NEGATIVE
Hepatitis B Surface Ag: NEGATIVE

## 2016-02-19 LAB — CBC
HEMATOCRIT: 35.6 % — AB (ref 36.0–46.0)
Hemoglobin: 11.8 g/dL — ABNORMAL LOW (ref 12.0–15.0)
MCH: 29.3 pg (ref 26.0–34.0)
MCHC: 33.1 g/dL (ref 30.0–36.0)
MCV: 88.3 fL (ref 78.0–100.0)
PLATELETS: 155 10*3/uL (ref 150–400)
RBC: 4.03 MIL/uL (ref 3.87–5.11)
RDW: 13 % (ref 11.5–15.5)
WBC: 7.6 10*3/uL (ref 4.0–10.5)

## 2016-02-19 LAB — GLUCOSE, CAPILLARY
GLUCOSE-CAPILLARY: 148 mg/dL — AB (ref 65–99)
GLUCOSE-CAPILLARY: 65 mg/dL (ref 65–99)
Glucose-Capillary: 88 mg/dL (ref 65–99)
Glucose-Capillary: 157 mg/dL — ABNORMAL HIGH (ref 65–99)
Glucose-Capillary: 303 mg/dL — ABNORMAL HIGH (ref 65–99)

## 2016-02-19 LAB — HEPARIN LEVEL (UNFRACTIONATED): HEPARIN UNFRACTIONATED: 0.59 [IU]/mL (ref 0.30–0.70)

## 2016-02-19 MED ORDER — REGADENOSON 0.4 MG/5ML IV SOLN
0.4000 mg | Freq: Once | INTRAVENOUS | Status: AC
  Administered 2016-02-19: 0.4 mg via INTRAVENOUS
  Filled 2016-02-19: qty 5

## 2016-02-19 MED ORDER — REGADENOSON 0.4 MG/5ML IV SOLN
INTRAVENOUS | Status: AC
  Filled 2016-02-19: qty 5

## 2016-02-19 MED ORDER — TECHNETIUM TC 99M SESTAMIBI - CARDIOLITE
30.0000 | Freq: Once | INTRAVENOUS | Status: AC | PRN
  Administered 2016-02-19: 30 via INTRAVENOUS

## 2016-02-19 MED ORDER — TECHNETIUM TC 99M SESTAMIBI GENERIC - CARDIOLITE
10.0000 | Freq: Once | INTRAVENOUS | Status: AC | PRN
  Administered 2016-02-19: 10 via INTRAVENOUS

## 2016-02-19 NOTE — Discharge Instructions (Signed)

## 2016-02-19 NOTE — Progress Notes (Signed)
ANTICOAGULATION CONSULT NOTE  Pharmacy Consult for heparin Indication: chest pain/ACS  Allergies  Allergen Reactions  . Levemir [Insulin Detemir] Hives and Swelling  . Metformin And Related Other (See Comments)    Sick on stomach, sweaty, throw up, pain    Patient Measurements: Height: 5\' 5"  (165.1 cm) Weight: 169 lb 3.2 oz (76.749 kg) IBW/kg (Calculated) : 57 Heparin Dosing Weight: 73 kg  Vital Signs: Temp: 98.1 F (36.7 C) (05/14 0400) BP: 131/49 mmHg (05/14 0400) Pulse Rate: 50 (05/14 0400)  Labs:  Recent Labs  02/18/16 0520 02/18/16 1001 02/18/16 1209 02/18/16 1520 02/18/16 2023 02/19/16 0539  HGB 14.0 12.2  --   --   --  11.8*  HCT 40.6 37.0  --   --   --  35.6*  PLT 184 182  --   --   --  155  HEPARINUNFRC  --   --   --   --  0.52 0.59  CREATININE 0.69 0.68  --   --   --   --   TROPONINI <0.03 <0.03 <0.03 <0.03  --   --     Estimated Creatinine Clearance: 67 mL/min (by C-G formula based on Cr of 0.68).   Assessment: 70 yo woman admitted 02/18/2016 for ACS r/o with neck & jaw pain, SOB. Endorsed N/V, diaphoresis prior to presenting. Pharmacy consulted to dose heparin.  PMH HTN, CAD s/p PCI (2001), HLD, DM2, RLS, pancreatitis, depression  HL continues to be therapeutic at 0.59 units/mL on heparin 850 units/h. No bleeding noted. Hgb 11.8, plt wnl.  Goal of Therapy:  Heparin level 0.3-0.7 units/ml Monitor platelets by anticoagulation protocol: Yes   Plan:  Heparin 850 units/h Daily HL, CBC Monitor s/sx bleeding    Hillery AldoElizabeth Mitsuru Dault, Pharm.D., BCPS PGY2 Cardiology Pharmacy Resident Pager: [phone]  02/19/2016 10:24 AM

## 2016-02-19 NOTE — Progress Notes (Signed)
  Echocardiogram 2D Echocardiogram has been performed.  [name] 02/19/2016, 4:45 PM

## 2016-02-19 NOTE — Consult Note (Signed)
CARDIOLOGY CONSULT NOTE   Patient ID: [name] MRN: [id number] DOB/AGE: 14-Apr-1946 62 y.o.  Admit date: 02/18/2016  Primary Physician   Emily Held, DO Primary Cardiologist: New Requesting MD: [name] Reason for Consultation: Chest pain   HPI: [name] is a 70 year old female with a past medical history of DM, HTN, HLD, and depression. She has a history of CAD with remote PCI in 2001 after having an abnormal stress test.    She was playing [place] on Friday night 02/17/16 and began to have left sided neck pain that radiated to her jaw. She did not feel SOB, nauseous with the pain. No diaphoresis. Pain lasted about 45 minutes so she took one hydrocodone and had complete relief from her pain.  About an hour later, she developed perfuse diaphoresis, and was nauseous, did not have any vomiting. She had never experienced anything like this before. This lasted about an hour so she came to the ED.   EKG in the ED showed slight ST depression in V4-V6. Troponin has been negative x 4.   She works as a Surveyor, minerals, denies any SOB or chest pain with activity. She has a family history of MI, both her brother and her sister had MI's.  Her brother died from MI at age 9, sister is also deceased from heart complications and breast cancer.   Past Medical History  Diagnosis Date  . Diabetes mellitus   . Hypertension   . Hyperlipidemia   . Depression   . Restless leg syndrome   . Pancreatitis   . Blockage of coronary artery of heart [facility])      Past Surgical History  Procedure Laterality Date  . Tubal ligation    . Rotator cuff repair      right  . Coronary angioplasty with stent placement    . Foot surgery Right     cyst removal    Allergies  Allergen Reactions  . Levemir [Insulin Detemir] Hives and Swelling  . Metformin And Related Other (See Comments)    Sick on stomach, sweaty, throw up, pain    I have reviewed the patient's current medications .  amLODipine  10 mg Oral Daily  . aspirin EC  81 mg Oral Daily  . atorvastatin  40 mg Oral q1800  . buPROPion  150 mg Oral Daily  . fluticasone  2 spray Each Nare Daily  . gabapentin  100 mg Oral TID  . insulin aspart  0-15 Units Subcutaneous Q4H  . insulin NPH Human  40 Units Subcutaneous QHS  . lisinopril  10 mg Oral Daily  . loratadine  10 mg Oral QPM  . metoprolol succinate  50 mg Oral Daily  . omega-3 acid ethyl esters  1 g Oral Daily  . vitamin E  100 Units Oral Daily     acetaminophen, ALPRAZolam, HYDROcodone-acetaminophen, morphine injection, ondansetron (ZOFRAN) IV  Prior to Admission medications   Medication Sig Start Date End Date Taking? Authorizing Provider  alendronate (FOSAMAX) 70 MG tablet take 1 tablet by mouth every week on an empty stomach with 6-8 oz of water. No food / meds for 30 min. Remain Upright Patient taking differently: take 1 tablet by mouth every Sunday on an empty stomach with 6-8 oz of water. No food / meds for 30 min. Remain Upright 07/04/15  Yes Emily R Lowne Chase, DO  ALPRAZolam [name]) 0.25 MG tablet take 1 tablet by mouth three times a  day if needed Patient taking differently: take 1 tablet by mouth daily  if needed for anxiety 09/23/15  Yes Emily R Lowne Chase, DO  amLODipine (NORVASC) 10 MG tablet Take 1 tablet (10 mg total) by mouth daily. 02/10/16  Yes Emily R Lowne Chase, DO  aspirin EC 81 MG tablet Take 81 mg by mouth daily.   Yes Historical Provider, MD  atorvastatin (LIPITOR) 40 MG tablet take 1 tablet by mouth once daily Patient taking differently: take 1 tablet by mouth once daily at bedtime 01/17/16  Yes Emily R Lowne Chase, DO  Carboxymethylcellulose Sodium (THERATEARS OP) Place 1 drop into both eyes daily as needed (dry eyes).   Yes Historical Provider, MD  CINNAMON PO Take 1 tablet by mouth daily.    Yes Historical Provider, MD  Cyanocobalamin (VITAMIN B-12 PO) Take 1 tablet by mouth daily.   Yes Historical Provider, MD  ferrous sulfate  325 (65 FE) MG tablet Take 325 mg by mouth daily with breakfast.   Yes Historical Provider, MD  Flaxseed, Linseed, (FLAXSEED OIL PO) Take 1 capsule by mouth 2 (two) times daily.    Yes Historical Provider, MD  FLUoxetine (PROZAC) 20 MG capsule take 1 capsule by mouth once daily Patient taking differently: take 1 capsule by mouth once daily as needed for depression 09/22/15  Yes Emily R Lowne Chase, DO  fluticasone (FLONASE) 50 MCG/ACT nasal spray Place 2 sprays into both nostrils daily. Patient taking differently: Place 2 sprays into both nostrils daily as needed (congestion).  11/14/15  Yes Emily R Lowne Chase, DO  gabapentin (NEURONTIN) 100 MG capsule Take 100 mg by mouth 2 (two) times daily.  06/09/15  Yes Historical Provider, MD  Loel Ro COMPLEX PO Take 1 tablet by mouth daily.    Yes Historical Provider, MD  HYDROcodone-acetaminophen (NORCO) 5-325 MG tablet Take 1 tablet by mouth every 6 (six) hours as needed for moderate pain. 11/14/15  Yes Emily R Lowne Chase, DO  ibuprofen (ADVIL,MOTRIN) 200 MG tablet Take 400 mg by mouth every 6 (six) hours as needed (pain).   Yes Historical Provider, MD  levocetirizine (XYZAL) 5 MG tablet Take 1 tablet (5 mg total) by mouth every evening. Patient taking differently: Take 5 mg by mouth at bedtime as needed for allergies.  11/14/15  Yes Emily R Lowne Chase, DO  lisinopril (PRINIVIL,ZESTRIL) 10 MG tablet Take 1 tablet (10 mg total) by mouth daily. 02/10/16  Yes Emily R Lowne Chase, DO  metoprolol succinate (TOPROL-XL) 50 MG 24 hr tablet take 1 tablet by mouth once daily with food OR IMMEDIATELY FOLLOWING A MEAL 12/13/15  Yes Emily R Lowne Chase, DO  Multiple Vitamin (MULTIVITAMIN WITH MINERALS) TABS Take 1 tablet by mouth daily.   Yes Historical Provider, MD  NOVOLIN N RELION 100 UNIT/ML injection Inject 30-60 Units into the skin 2 (two) times daily. Inject 30 units subcutaneously before breakfast and 60 units at bedtime 05/05/12  Yes Historical Provider, MD    NOVOLIN R RELION 100 UNIT/ML injection Inject 30 Units into the skin daily as needed for high blood sugar (at noon if CBG >200 and will be eating).  05/29/12  Yes Historical Provider, MD  Omega-3 Fatty Acids (FISH OIL) 1200 MG CAPS Take 1,200 mg by mouth 2 (two) times daily.   Yes Historical Provider, MD  vitamin E 400 UNIT capsule Take 400 Units by mouth daily.   Yes Historical Provider, MD  Blood Glucose Calibration (TAI [facility] CONTROL) NORMAL SOLN  10/19/12  Historical Provider, MD  Blood Glucose Monitoring Suppl (ACCU-CHEK AVIVA PLUS) w/Device KIT Check blood sugar daily. E11.9 11/25/15   Rosalita Chessman Chase, DO  glucose blood (ACCU-CHEK AVIVA) test strip Check blood sugar four times daily. E11.9 12/14/14   Alferd Apa Lowne Chase, DO  Insulin Pen Needle (PEN NEEDLES 31GX5/16") 31G X 8 MM MISC by Does not apply route daily.      Historical Provider, MD  Insulin Syringe-Needle U-100 (BD INSULIN SYRINGE ULTRAFINE) 31G X 5/16" 0.3 ML MISC 10 Units by Does not apply route 3 (three) times daily before meals.      Historical Provider, MD  Lancet Devices (LANCING DEVICE) MISC  10/19/12   Historical Provider, MD  meclizine (ANTIVERT) 25 MG tablet Take 1 tablet (25 mg total) by mouth 3 (three) times daily as needed for dizziness. Patient not taking: Reported on 02/18/2016 12/31/14   Ottie Glazier, PA-C  omega-3 acid ethyl esters (LOVAZA) 1 G capsule Take 2 capsules (2 g total) by mouth 2 (two) times daily. Patient not taking: Reported on 02/18/2016 09/17/14   Alferd Apa Lowne Chase, DO  ondansetron (ZOFRAN) 4 MG tablet Take 1 tablet (4 mg total) by mouth every 6 (six) hours. Patient not taking: Reported on 02/18/2016 12/31/14   Ottie Glazier, PA-C  PHARMACIST CHOICE LANCETS MISC  10/31/13   Historical Provider, MD     Social History   Social History  . Marital Status: Married    Spouse Name: N/A  . Number of Children: N/A  . Years of Education: N/A   Occupational History  . Not on file.   Social  History Main Topics  . Smoking status: Former Smoker -- 0.00 packs/day  . Smokeless tobacco: Never Used  . Alcohol Use: No  . Drug Use: No  . Sexual Activity: Not on file   Other Topics Concern  . Not on file   Social History Narrative   Exercise: seldom   Caffeine use: seldom    Family Status  Relation Status Death Age  . Father Deceased   . Sister Deceased   . Brother Deceased 83   Family History  Problem Relation Age of Onset  . Lung cancer    . Breast cancer Sister   . Thyroid cancer    . Uterine cancer    . Cancer Other     lung,thyroid,breast,uterine  . Heart attack Father   . Heart attack Sister   . Heart attack Brother 61     ROS:  Full 14 point review of systems complete and found to be negative unless listed above.  Physical Exam: Blood pressure 131/49, pulse 50, temperature 98.1 F (36.7 C), temperature source Oral, resp. rate 16, height _0  (1.651 m), weight 169 lb 3.2 oz (76.749 kg), SpO2 99 %.  General: Well developed, well nourished, female in no acute distress Head: Eyes PERRLA, No xanthomas.   Normocephalic and atraumatic, oropharynx without edema or exudate. Lungs: CTA Heart: HRRR S1 S2, no rub/gallop, No murmur. pulses are 2+ extrem.   Neck: No carotid bruits. No lymphadenopathy.  No JVD. Abdomen: Bowel sounds present, abdomen soft and non-tender without masses or hernias noted. Msk:  No spine or cva tenderness. No weakness, no joint deformities or effusions. Extremities: No clubbing or cyanosis.  No edema.  Neuro: Alert and oriented X 3. No focal deficits noted. Psych:  Good affect, responds appropriately Skin: No rashes or lesions noted.  Labs:   Lab Results  Component Value Date  WBC 7.6 02/19/2016   HGB 11.8* 02/19/2016   HCT 35.6* 02/19/2016   MCV 88.3 02/19/2016   PLT 155 02/19/2016    Recent Labs Lab 02/18/16 1001  NA 136  K 3.7  CL 102  CO2 26  BUN 14  CREATININE 0.68  CALCIUM 9.6  PROT 6.6  BILITOT 1.2  ALKPHOS 50   ALT 24  AST 19  GLUCOSE 226*  ALBUMIN 3.7   MAGNESIUM  Date Value Ref Range Status  02/18/2016 1.8 1.7 - 2.4 mg/dL Final    Recent Labs  02/18/16 0520 02/18/16 1001 02/18/16 1209 02/18/16 1520  TROPONINI <0.03 <0.03 <0.03 <0.03    Lab Results  Component Value Date   CHOL 128 02/18/2016   HDL 36* 02/18/2016   LDLCALC 80 02/18/2016   TRIG 61 02/18/2016     Echo: pending  ECG:  NSR with PAC's, RBBB and LABF  Radiology:  Dg Chest 2 View  02/18/2016  CLINICAL DATA:  LEFT neck and jaw pain for 6 hours, nausea, vomiting and diaphoresis. History of hypertension, diabetes. EXAM: CHEST  2 VIEW COMPARISON:  Chest radiograph December 29, 2015 FINDINGS: Cardiomediastinal silhouette is normal. Mild calcific atherosclerosis of the aortic knob. Mild bibasilar linear densities. The lungs are otherwise clear without pleural effusions or focal consolidations. Trachea projects midline and there is no pneumothorax. Soft tissue planes and included osseous structures are non-suspicious. Mild degenerative change of thoracic spine. IMPRESSION: Mild bibasilar atelectasis. Electronically Signed   By: Elon Alas M.D.   On: 02/18/2016 05:56    ASSESSMENT AND PLAN:    Active Problems:   Diabetes mellitus type II, uncontrolled (HCC)   Depression   Essential hypertension   Dyspnea   Chest pain   HLD (hyperlipidemia)   CAD in native artery   RLS (restless legs syndrome)   Pancreatitis   SOB (shortness of breath)   Uncontrolled type 2 diabetes mellitus with complication (HCC)   Tobacco abuse  1. Jaw pain/neck pain with risk for cardiac etiology: Patient presented with neck and jaw pain, then later developed perfuse diaphoresis and nausea.  EKG shows ST depression in V4-V6 with some depression as well in V2. She has history of CAD with remote PCI in 2001. She has cardiac risk factors including DM, HTN, HLD and strong family history. She will need Lexiscan Myoview today to evaluate for ischemia.    2. HLD: Last LDL was 80, she is on moderate dose statin, continue same.   3. DM: Last A1C was 11.1. Management per primary team. She is insulin dependent.   4. HTN: Blood pressure is well controlled with CCB, ACEI and beta blocker. She is bradycardic with HR in 50's.     Signed: Arbutus Leas, NP 02/19/2016 8:38 AM Pager [phone]  Co-Sign MD  Discussed with Jettie Booze. Further evaluation with stress nuclear perfusion scan is appropriate.  Sanda Klein, MD, [facility] [phone] office [phone] pager

## 2016-02-19 NOTE — Discharge Summary (Signed)
Physician Discharge Summary  [name] SVX:[id number] DOB: [birth year] DOA: 02/18/2016  PCP: Ann Held, DO  Admit date: 02/18/2016 Discharge date: 02/19/2016  Recommendations for Outpatient Follow-up:  1. No changes in medications on discharge   Discharge Diagnoses:  Active Problems:   Diabetes mellitus type II, uncontrolled (HCC)   Depression   Essential hypertension   Dyspnea   Chest pain   HLD (hyperlipidemia)   CAD in native artery   RLS (restless legs syndrome)   Pancreatitis   SOB (shortness of breath)   Uncontrolled type 2 diabetes mellitus with complication (HCC)   Tobacco abuse    Discharge Condition: stable   Diet recommendation: as tolerated   History of present illness:   Per HPI 5/13  "70 y.o. BF PMHx HTN, CAD native artery,HLD, DM Type II uncontrolled with complications,RLS, pancreatitis(medication induced),non-compliant with diabetic diet, Tobacco abuse, Left Rotator Cuff Tear  Who presents to the emergency department with complaints of left-sided neck and jaw pain that started just prior to arrival. She did have associated shortness of breath that she describes as a "struggle to breathe". No chest pain or chest discomfort. Did have nausea, vomiting and diaphoresis. Symptoms lasted~ 5 hours. Took a hydrocodone at home and now her pain is gone. Continued residual N/fatigue.[name] Has never had similar symptoms. She does have a history of CAD with a stent placed in 2001. Reports at that time she had a routine stress test which was abnormal requiring her to have a cardiac catheterization. She never had symptoms at that time. No history of PE or DVT. No fever or cough. No lower extremity swelling or pain. No dental pain, pain with opening her mouth, chewing, ear pain. 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 with long term insulin use -2/6 hemoglobin A1c=11.1 - Resume insulin regime per home dose   Tobacco Abuse - counseled on cessation   Depression, anxiety  - Resume home meds: Prozac and xanax   Code Status: Full (DVT Prophylaxis: Heparin drip Family Communication: multiple family members Present  Disposition Plan: resolution CP workup    Signed:  Leisa Lenz, MD  [facility] 02/19/2016, 3:36 PM  Pager #: [phone]  Time spent in minutes: less than 30 minutes    Discharge Exam: Filed Vitals:   02/19/16 1222 02/19/16 1223  BP: 110/60   Pulse: 76 65  Temp:    Resp:     Filed Vitals:   02/19/16 1219 02/19/16 1220 02/19/16 1222 02/19/16 1223  BP: 150/81 118/51 110/60   Pulse: 71 70 76 65  Temp:      TempSrc:      Resp:      Height:      Weight:      SpO2:        General: Pt is alert, follows commands appropriately, not in acute distress Cardiovascular: Regular rate and rhythm, S1/S2 +, no murmurs Respiratory: Clear to auscultation bilaterally, no wheezing,  no crackles, no rhonchi Abdominal: Soft, non tender, non distended, bowel sounds +, no guarding Extremities: no edema, no cyanosis, pulses palpable bilaterally DP and PT Neuro: Grossly nonfocal  Discharge Instructions  Discharge Instructions    Call MD for:  difficulty breathing, headache or visual disturbances    Complete by:  As directed      Call MD for:  persistant dizziness or light-headedness    Complete by:  As directed      Call MD for:  persistant nausea and vomiting    Complete by:  As directed       Call MD for:  severe uncontrolled pain    Complete by:  As directed      Diet - low sodium heart healthy    Complete by:  As directed      Discharge instructions    Complete by:  As directed   Stress test - low risk study     Increase activity slowly    Complete by:  As directed             Medication List    STOP taking these medications        Fish Oil 1200 MG Caps      TAKE these medications        ACCU-CHEK AVIVA PLUS w/Device Kit  Check blood sugar daily. E11.9     alendronate 70 MG tablet  Commonly known as:  FOSAMAX  take 1 tablet by mouth every week on an empty stomach with 6-8 oz of water. No food / meds for 30 min. Remain Upright     ALPRAZolam 0.25 MG tablet  Commonly known as:  XANAX  take 1 tablet by mouth three times a day if needed     amLODipine 10 MG tablet  Commonly known as:  NORVASC  Take 1 tablet (10 mg total) by mouth daily.     aspirin EC 81 MG tablet  Take 81 mg by mouth daily.     atorvastatin 40 MG tablet  Commonly known as:  LIPITOR  take 1 tablet by mouth once daily     BD INSULIN SYRINGE ULTRAFINE 31G X 5/16" 0.3 ML Misc  Generic drug:  Insulin Syringe-Needle U-100  10 Units by Does not apply route 3 (three) times daily before meals.     CINNAMON PO  Take 1 tablet by mouth daily.     ferrous sulfate 325 (65 FE) MG tablet  Take 325 mg by mouth daily with breakfast.     FLAXSEED OIL PO  Take 1 capsule by mouth 2 (two) times daily.     FLUoxetine 20 MG capsule  Commonly known as:  PROZAC  take 1 capsule by mouth once daily     fluticasone 50 MCG/ACT nasal spray  Commonly known as:  FLONASE  Place 2 sprays into both nostrils daily.     gabapentin 100 MG capsule  Commonly known as:  NEURONTIN  Take 100 mg by mouth 2 (two) times daily.     GINKGO BILOBA COMPLEX PO  Take 1 tablet by mouth daily.     glucose blood test strip  Commonly known as:  ACCU-CHEK AVIVA  Check blood sugar four times daily. E11.9      HYDROcodone-acetaminophen 5-325 MG tablet  Commonly known as:  NORCO  Take 1 tablet by mouth every 6 (six) hours as needed for moderate pain.     ibuprofen 200 MG tablet  Commonly known as:  ADVIL,MOTRIN  Take 400 mg by mouth every 6 (six) hours as needed (pain).     Lancing Device Misc     levocetirizine 5 MG tablet  Commonly known as:  XYZAL  Take 1 tablet (5 mg total) by mouth every evening.     lisinopril 10 MG tablet  Commonly known as:  PRINIVIL,ZESTRIL  Take 1 tablet (10 mg total) by mouth daily.     meclizine 25 MG tablet  Commonly known as:  ANTIVERT  Take 1 tablet (25 mg total) by mouth 3 (three) times daily as needed for dizziness.     metoprolol succinate 50 MG 24 hr tablet  Commonly known as:  TOPROL-XL  take 1 tablet by mouth once daily with food OR IMMEDIATELY FOLLOWING A MEAL     multivitamin with minerals Tabs tablet  Take 1 tablet by mouth daily.     NOVOLIN N RELION 100 UNIT/ML injection  Generic drug:  insulin NPH Human  Inject 30-60 Units into the skin 2 (two) times daily. Inject 30 units subcutaneously before breakfast and 60 units at bedtime     NOVOLIN R RELION 100 units/mL injection  Generic drug:  insulin regular  Inject 30 Units into the skin daily as needed for high blood sugar (at noon if CBG >200 and will be eating).     omega-3 acid ethyl esters 1 g capsule  Commonly known as:  LOVAZA  Take 2 capsules (2 g total) by mouth 2 (two) times daily.     ondansetron 4 MG tablet  Commonly known as:  ZOFRAN  Take 1 tablet (4 mg total) by mouth every 6 (six) hours.     PEN NEEDLES 31GX5/16" 31G X 8 MM Misc  by Does not apply route daily.     PHARMACIST CHOICE LANCETS Misc     TAI DOC CONTROL Normal Soln     THERATEARS OP  Place 1 drop into both eyes daily as needed (dry eyes).     VITAMIN B-12 PO  Take 1 tablet by mouth daily.     vitamin E 400 UNIT capsule  Take 400 Units by mouth daily.           Follow-up Information    Follow  up with Ann Held, DO. Schedule an appointment as soon as possible for a visit in 1 week.   Specialty:  Family Medicine   Why:  Follow up appt after recent hospitalization   Contact information:   [place] [address] [phone]        The results of significant diagnostics from this hospitalization (including imaging, microbiology, ancillary and laboratory) are listed below for reference.    Significant Diagnostic Studies: Dg Chest 2 View 02/18/2016  Mild bibasilar atelectasis. Electronically Signed   By: Elon Alas M.D.   On: 02/18/2016 05:56   Nm Myocar Multi W/spect W/wall Motion / Ef 02/19/2016   1. No reversible ischemia or infarction. 2. Normal left ventricular wall motion. 3. Left ventricular ejection fraction 58% 4. Low-risk stress test findings*. *2012 Appropriate Use Criteria for Coronary Revascularization Focused Update: J Am Coll Cardiol. 4536;46(8):032-122. http://content.[email or web address].aspx?articleid=[email or web address] Electronically Signed   By: Jerilynn Mages.  Shick M.D.   On: 02/19/2016 15:10    Microbiology: No results found for this or any previous visit (from the past 240 hour(s)).   Labs: Basic Metabolic Panel:  Recent Labs Lab 02/18/16 0520 02/18/16 1001  NA 137 136  K 3.6 3.7  CL 102 102  CO2 26 26  GLUCOSE 303* 226*  BUN 18 14  CREATININE 0.69 0.68  CALCIUM 10.2 9.6  MG  --  1.8   Liver Function Tests:  Recent Labs Lab 02/18/16 1001  AST 19  ALT 24  ALKPHOS 50  BILITOT 1.2  PROT 6.6  ALBUMIN 3.7    Recent Labs Lab 02/18/16 1209  LIPASE 48   No results for input(s): AMMONIA in the last 168 hours. CBC:  Recent Labs Lab 02/18/16 0520 02/18/16 1001 02/19/16 0539  WBC 9.8 8.6 7.6  NEUTROABS 5.5 4.7  --   HGB 14.0 12.2 11.8*  HCT 40.6 37.0 35.6*  MCV 87.1 87.3 88.3  PLT 184 182 155   Cardiac Enzymes:  Recent Labs Lab 02/18/16 0520 02/18/16 1001 02/18/16 1209 02/18/16 1520  TROPONINI  <0.03 <0.03 <0.03 <0.03   BNP: BNP (last 3 results) No results for input(s): BNP in the last 8760 hours.  ProBNP (last 3 results)  Recent Labs  12/29/15 1921  PROBNP 12.0    CBG:  Recent Labs Lab 02/18/16 2059 02/19/16 0136 02/19/16 0446 02/19/16 0754 02/19/16 1313  GLUCAP 339* 148* 88 65 157*

## 2016-02-20 ENCOUNTER — Telehealth: Payer: Self-pay | Admitting: Behavioral Health

## 2016-02-20 LAB — ECHOCARDIOGRAM COMPLETE
Height: 65 in
WEIGHTICAEL: 2707.2 [oz_av]

## 2016-02-20 NOTE — Telephone Encounter (Signed)
Transition Care Management Follow-up Telephone Call  PCP: Donato SchultzYvonne R Lowne Chase, DO  Admit date: 02/18/2016 Discharge date: 02/19/2016  Recommendations for Outpatient Follow-up:  1. No changes in medications on discharge  Discharge Diagnoses:  Active Problems:  Diabetes mellitus type II, uncontrolled (HCC)  Depression  Essential hypertension  Dyspnea  Chest pain  HLD (hyperlipidemia)  CAD in native artery  RLS (restless legs syndrome)  Pancreatitis  SOB (shortness of breath)  Uncontrolled type 2 diabetes mellitus with complication (HCC)  Tobacco abuse  Discharge Condition: stable    How have you been since you were released from the hospital? Patient stated, "Oh I'm doing good".   Do you understand why you were in the hospital? yes, patient voiced that she had symptoms of a heart attack, but it was ruled out in the hospital that she did not have one.   Do you understand the discharge instructions? yes   Where were you discharged to? Home   Items Reviewed:  Medications reviewed: yes  Allergies reviewed: yes  Dietary changes reviewed: per the patient, no changes were made in diet.  Referrals reviewed: yes, follow-up with PCP in 1 week.   Functional Questionnaire:   Activities of Daily Living (ADLs):   She states they are independent in the following: ambulation, bathing and hygiene, feeding, continence, grooming, toileting and dressing States they require assistance with the following: None   Any transportation issues/concerns?: no   Any patient concerns? no   Confirmed importance and date/time of follow-up visits scheduled yes, 03/01/16 at 11:30 AM.  Provider Appointment booked with Dr. Zola ButtonLowne Nicholson.  Confirmed with patient if condition begins to worsen call PCP or go to the ER.  Patient was given the office number and encouraged to call back with question or concerns.  : yes

## 2016-03-01 ENCOUNTER — Encounter: Payer: Self-pay | Admitting: Family Medicine

## 2016-03-01 ENCOUNTER — Ambulatory Visit (INDEPENDENT_AMBULATORY_CARE_PROVIDER_SITE_OTHER): Payer: Commercial Managed Care - HMO | Admitting: Family Medicine

## 2016-03-01 VITALS — BP 138/62 | Temp 98.1°F | Resp 16 | Ht 65.0 in | Wt 171.2 lb

## 2016-03-01 DIAGNOSIS — IMO0002 Reserved for concepts with insufficient information to code with codable children: Secondary | ICD-10-CM

## 2016-03-01 DIAGNOSIS — E1151 Type 2 diabetes mellitus with diabetic peripheral angiopathy without gangrene: Secondary | ICD-10-CM

## 2016-03-01 DIAGNOSIS — Z720 Tobacco use: Secondary | ICD-10-CM

## 2016-03-01 DIAGNOSIS — M25561 Pain in right knee: Secondary | ICD-10-CM | POA: Diagnosis not present

## 2016-03-01 DIAGNOSIS — I10 Essential (primary) hypertension: Secondary | ICD-10-CM | POA: Diagnosis not present

## 2016-03-01 DIAGNOSIS — E1165 Type 2 diabetes mellitus with hyperglycemia: Secondary | ICD-10-CM

## 2016-03-01 DIAGNOSIS — R079 Chest pain, unspecified: Secondary | ICD-10-CM

## 2016-03-01 MED ORDER — LISINOPRIL 10 MG PO TABS: 10.0000 mg | ORAL_TABLET | Freq: Every day | ORAL | Status: DC

## 2016-03-01 MED ORDER — GABAPENTIN 100 MG PO CAPS: 100.0000 mg | ORAL_CAPSULE | Freq: Two times a day (BID) | ORAL | Status: DC

## 2016-03-01 MED ORDER — HYDROCODONE-ACETAMINOPHEN 5-325 MG PO TABS: 1.0000 | ORAL_TABLET | Freq: Four times a day (QID) | ORAL | Status: DC | PRN

## 2016-03-01 MED ORDER — INSULIN REGULAR HUMAN 100 UNIT/ML IJ SOLN: 30.0000 [IU] | Freq: Every day | INTRAMUSCULAR | Status: DC | PRN

## 2016-03-01 MED ORDER — METOPROLOL SUCCINATE ER 50 MG PO TB24: ORAL_TABLET | ORAL | Status: DC

## 2016-03-01 NOTE — Progress Notes (Signed)
Pre visit review using our clinic review tool, if applicable. No additional management support is needed unless otherwise documented below in the visit note. 

## 2016-03-01 NOTE — Patient Instructions (Signed)
Nonspecific Chest Pain  °Chest pain can be caused by many different conditions. There is always a chance that your pain could be related to something serious, such as a heart attack or a blood clot in your lungs. Chest pain can also be caused by conditions that are not life-threatening. If you have chest pain, it is very important to follow up with your health care provider. °CAUSES  °Chest pain can be caused by: °· Heartburn. °· Pneumonia or bronchitis. °· Anxiety or stress. °· Inflammation around your heart (pericarditis) or lung (pleuritis or pleurisy). °· A blood clot in your lung. °· A collapsed lung (pneumothorax). It can develop suddenly on its own (spontaneous pneumothorax) or from trauma to the chest. °· Shingles infection (varicella-zoster virus). °· Heart attack. °· Damage to the bones, muscles, and cartilage that make up your chest wall. This can include: °¨ Bruised bones due to injury. °¨ Strained muscles or cartilage due to frequent or repeated coughing or overwork. °¨ Fracture to one or more ribs. °¨ Sore cartilage due to inflammation (costochondritis). °RISK FACTORS  °Risk factors for chest pain may include: °· Activities that increase your risk for trauma or injury to your chest. °· Respiratory infections or conditions that cause frequent coughing. °· Medical conditions or overeating that can cause heartburn. °· Heart disease or family history of heart disease. °· Conditions or health behaviors that increase your risk of developing a blood clot. °· Having had chicken pox (varicella zoster). °SIGNS AND SYMPTOMS °Chest pain can feel like: °· Burning or tingling on the surface of your chest or deep in your chest. °· Crushing, pressure, aching, or squeezing pain. °· Dull or sharp pain that is worse when you move, cough, or take a deep breath. °· Pain that is also felt in your back, neck, shoulder, or arm, or pain that spreads to any of these areas. °Your chest pain may come and go, or it may stay  constant. °DIAGNOSIS °Lab tests or other studies may be needed to find the cause of your pain. Your health care provider may have you take a test called an ambulatory ECG (electrocardiogram). An ECG records your heartbeat patterns at the time the test is performed. You may also have other tests, such as: °· Transthoracic echocardiogram (TTE). During echocardiography, sound waves are used to create a picture of all of the heart structures and to look at how blood flows through your heart. °· Transesophageal echocardiogram (TEE). This is a more advanced imaging test that obtains images from inside your body. It allows your health care provider to see your heart in finer detail. °· Cardiac monitoring. This allows your health care provider to monitor your heart rate and rhythm in real time. °· Holter monitor. This is a portable device that records your heartbeat and can help to diagnose abnormal heartbeats. It allows your health care provider to track your heart activity for several days, if needed. °· Stress tests. These can be done through exercise or by taking medicine that makes your heart beat more quickly. °· Blood tests. °· Imaging tests. °TREATMENT  °Your treatment depends on what is causing your chest pain. Treatment may include: °· Medicines. These may include: °¨ Acid blockers for heartburn. °¨ Anti-inflammatory medicine. °¨ Pain medicine for inflammatory conditions. °¨ Antibiotic medicine, if an infection is present. °¨ Medicines to dissolve blood clots. °¨ Medicines to treat coronary artery disease. °· Supportive care for conditions that do not require medicines. This may include: °¨ Resting. °¨ Applying heat   or cold packs to injured areas. °¨ Limiting activities until pain decreases. °HOME CARE INSTRUCTIONS °· If you were prescribed an antibiotic medicine, finish it all even if you start to feel better. °· Avoid any activities that bring on chest pain. °· Do not use any tobacco products, including  cigarettes, chewing tobacco, or electronic cigarettes. If you need help quitting, ask your health care provider. °· Do not drink alcohol. °· Take medicines only as directed by your health care provider. °· Keep all follow-up visits as directed by your health care provider. This is important. This includes any further testing if your chest pain does not go away. °· If heartburn is the cause for your chest pain, you may be told to keep your head raised (elevated) while sleeping. This reduces the chance that acid will go from your stomach into your esophagus. °· Make lifestyle changes as directed by your health care provider. These may include: °¨ Getting regular exercise. Ask your health care provider to suggest some activities that are safe for you. °¨ Eating a heart-healthy diet. A registered dietitian can help you to learn healthy eating options. °¨ Maintaining a healthy weight. °¨ Managing diabetes, if necessary. °¨ Reducing stress. °SEEK MEDICAL CARE IF: °· Your chest pain does not go away after treatment. °· You have a rash with blisters on your chest. °· You have a fever. °SEEK IMMEDIATE MEDICAL CARE IF:  °· Your chest pain is worse. °· You have an increasing cough, or you cough up blood. °· You have severe abdominal pain. °· You have severe weakness. °· You faint. °· You have chills. °· You have sudden, unexplained chest discomfort. °· You have sudden, unexplained discomfort in your arms, back, neck, or jaw. °· You have shortness of breath at any time. °· You suddenly start to sweat, or your skin gets clammy. °· You feel nauseous or you vomit. °· You suddenly feel light-headed or dizzy. °· Your heart begins to beat quickly, or it feels like it is skipping beats. °These symptoms may represent a serious problem that is an emergency. Do not wait to see if the symptoms will go away. Get medical help right away. Call your local emergency services (911 in the [place] Do not drive yourself to the hospital. °  °This  information is not intended to replace advice given to you by your health care provider. Make sure you discuss any questions you have with your health care provider. °  °Document Released: 07/04/2005 Document Revised: 10/15/2014 Document Reviewed: 04/30/2014 °[employer] Interactive Patient Education ©2016 [employer] ° °

## 2016-03-01 NOTE — Progress Notes (Signed)
Patient ID: [name], female    DOB: [birth year]  Age: 70 y.o. MRN: [id number]    Subjective:  Subjective HPI [name] presents for hospital f/u for neck and jaw pain and she became diaphoretic so her daughter called 911.   They gave her glucose tablets , oj and candy and eventually she felt somewhat better.  strest test was done in hospital and was neg.  Jaw/ neck pain is gone.  No sob, no chest pain.  Pt has f/u with cardiology. Pt only c/o knee pain and needs refills on bp meds.    Review of Systems  Constitutional: Negative for diaphoresis, appetite change, fatigue and unexpected weight change.  Eyes: Negative for pain, redness and visual disturbance.  Respiratory: Negative for cough, chest tightness, shortness of breath and wheezing.   Cardiovascular: Negative for chest pain, palpitations and leg swelling.  Endocrine: Negative for cold intolerance, heat intolerance, polydipsia, polyphagia and polyuria.  Genitourinary: Negative for dysuria, frequency and difficulty urinating.  Neurological: Negative for dizziness, light-headedness, numbness and headaches.    History Past Medical History  Diagnosis Date  . Diabetes mellitus   . Hypertension   . Hyperlipidemia   . Depression   . Restless leg syndrome   . Pancreatitis   . Blockage of coronary artery of heart (HCC)     She has past surgical history that includes Tubal ligation; Rotator cuff repair; Coronary angioplasty with stent; and Foot surgery (Right).   Her family history includes Breast cancer in her sister; Cancer in her other; Heart attack in her father and sister; Heart attack (age of onset: 52) in her brother.She reports that she has quit smoking. She has never used smokeless tobacco. She reports that she does not drink alcohol or use illicit drugs.  Current Outpatient Prescriptions on File Prior to Visit  Medication Sig Dispense Refill  . alendronate (FOSAMAX) 70 MG tablet take 1 tablet by mouth every  week on an empty stomach with 6-8 oz of water. No food / meds for 30 min. Remain Upright (Patient taking differently: take 1 tablet by mouth every Sunday on an empty stomach with 6-8 oz of water. No food / meds for 30 min. Remain Upright) 4 tablet 11  . ALPRAZolam (XANAX) 0.25 MG tablet take 1 tablet by mouth three times a day if needed (Patient taking differently: take 1 tablet by mouth daily  if needed for anxiety) 60 tablet 0  . amLODipine (NORVASC) 10 MG tablet Take 1 tablet (10 mg total) by mouth daily. 30 tablet 0  . aspirin EC 81 MG tablet Take 81 mg by mouth daily.    [name] atorvastatin (LIPITOR) 40 MG tablet take 1 tablet by mouth once daily (Patient taking differently: take 1 tablet by mouth once daily at bedtime) 30 tablet 5  . Blood Glucose Calibration (TAI DOC CONTROL) NORMAL SOLN     . Blood Glucose Monitoring Suppl (ACCU-CHEK AVIVA PLUS) w/Device KIT Check blood sugar daily. E11.9 1 kit 0  . Carboxymethylcellulose Sodium (THERATEARS OP) Place 1 drop into both eyes daily as needed (dry eyes).    . CINNAMON PO Take 1 tablet by mouth daily.     . Cyanocobalamin (VITAMIN B-12 PO) Take 1 tablet by mouth daily.    . ferrous sulfate 325 (65 FE) MG tablet Take 325 mg by mouth daily with breakfast.    . Flaxseed, Linseed, (FLAXSEED OIL PO) Take 1 capsule by mouth 2 (two) times daily.     [name]  FLUoxetine (PROZAC) 20 MG capsule take 1 capsule by mouth once daily 30 capsule 5  . fluticasone (FLONASE) 50 MCG/ACT nasal spray Place 2 sprays into both nostrils daily. 16 g 6  . GINKGO BILOBA COMPLEX PO Take 1 tablet by mouth daily.     [name] glucose blood (ACCU-CHEK AVIVA) test strip Check blood sugar four times daily. E11.9 400 each 3  . ibuprofen (ADVIL,MOTRIN) 200 MG tablet Take 400 mg by mouth every 6 (six) hours as needed (pain).    . Insulin Pen Needle (PEN NEEDLES 31GX5/16") 31G X 8 MM MISC by Does not apply route daily.      . Insulin Syringe-Needle U-100 (BD INSULIN SYRINGE ULTRAFINE) 31G X 5/16" 0.3  ML MISC 10 Units by Does not apply route 3 (three) times daily before meals.      Elmore Guise Devices (LANCING DEVICE) MISC     . levocetirizine (XYZAL) 5 MG tablet Take 1 tablet (5 mg total) by mouth every evening. 30 tablet 5  . meclizine (ANTIVERT) 25 MG tablet Take 1 tablet (25 mg total) by mouth 3 (three) times daily as needed for dizziness. 10 tablet 0  . Multiple Vitamin (MULTIVITAMIN WITH MINERALS) TABS Take 1 tablet by mouth daily.    [name] NOVOLIN N RELION 100 UNIT/ML injection Inject 30-60 Units into the skin 2 (two) times daily. Inject 30 units subcutaneously before breakfast and 60 units at bedtime    . omega-3 acid ethyl esters (LOVAZA) 1 G capsule Take 2 capsules (2 g total) by mouth 2 (two) times daily. 120 capsule 5  . ondansetron (ZOFRAN) 4 MG tablet Take 1 tablet (4 mg total) by mouth every 6 (six) hours. 12 tablet 0  . PHARMACIST CHOICE LANCETS MISC     . vitamin E 400 UNIT capsule Take 400 Units by mouth daily.     No current facility-administered medications on file prior to visit.     Objective:  Objective Physical Exam  Constitutional: She is oriented to person, place, and time. She appears well-developed and well-nourished.  HENT:  Head: Normocephalic and atraumatic.  Eyes: Conjunctivae and EOM are normal.  Neck: Normal range of motion. Neck supple. No JVD present. Carotid bruit is not present. No thyromegaly present.  Cardiovascular: Normal rate, regular rhythm and normal heart sounds.   No murmur heard. Pulmonary/Chest: Effort normal and breath sounds normal. No respiratory distress. She has no wheezes. She has no rales. She exhibits no tenderness.  Musculoskeletal: She exhibits no edema.  Neurological: She is alert and oriented to person, place, and time.  Psychiatric: She has a normal mood and affect. Her behavior is normal.  Nursing note and vitals reviewed.  BP 138/62 mmHg  Temp(Src) 98.1 F (36.7 C) (Oral)  Resp 16  Ht 5' 5" (1.651 m)  Wt 171 lb 3.2 oz  (77.656 kg)  BMI 28.49 kg/m2 Wt Readings from Last 3 Encounters:  03/01/16 171 lb 3.2 oz (77.656 kg)  02/19/16 169 lb 3.2 oz (76.749 kg)  12/29/15 180 lb 9.6 oz (81.92 kg)     Lab Results  Component Value Date   WBC 7.6 02/19/2016   HGB 11.8* 02/19/2016   HCT 35.6* 02/19/2016   PLT 155 02/19/2016   GLUCOSE 226* 02/18/2016   CHOL 128 02/18/2016   TRIG 61 02/18/2016   HDL 36* 02/18/2016   LDLDIRECT 59.0 11/14/2015   LDLCALC 80 02/18/2016   ALT 24 02/18/2016   AST 19 02/18/2016   NA 136 02/18/2016   K  3.7 02/18/2016   CL 102 02/18/2016   CREATININE 0.68 02/18/2016   BUN 14 02/18/2016   CO2 26 02/18/2016   TSH 0.68 06/02/2015   INR 1.1 03/03/2009   HGBA1C 11.1* 11/14/2015   MICROALBUR 1.7 06/23/2015    Dg Chest 2 View  02/18/2016  CLINICAL DATA:  LEFT neck and jaw pain for 6 hours, nausea, vomiting and diaphoresis. History of hypertension, diabetes. EXAM: CHEST  2 VIEW COMPARISON:  Chest radiograph December 29, 2015 FINDINGS: Cardiomediastinal silhouette is normal. Mild calcific atherosclerosis of the aortic knob. Mild bibasilar linear densities. The lungs are otherwise clear without pleural effusions or focal consolidations. Trachea projects midline and there is no pneumothorax. Soft tissue planes and included osseous structures are non-suspicious. Mild degenerative change of thoracic spine. IMPRESSION: Mild bibasilar atelectasis. Electronically Signed   By: Elon Alas M.D.   On: 02/18/2016 05:56   Nm Myocar Multi W/spect W/wall Motion / Ef  02/19/2016  CLINICAL DATA:  Acute chest pain radiating to the left neck and jaw. EXAM: MYOCARDIAL IMAGING WITH SPECT (REST AND PHARMACOLOGIC-STRESS) GATED LEFT VENTRICULAR WALL MOTION STUDY LEFT VENTRICULAR EJECTION FRACTION TECHNIQUE: Standard myocardial SPECT imaging was performed after resting intravenous injection of 10 mCi [id number]. Subsequently, intravenous infusion of Lexiscan was performed under the supervision of the  Cardiology staff. At peak effect of the drug, 30 mCi Tc-924mestamibi was injected intravenously and standard myocardial SPECT imaging was performed. Quantitative gated imaging was also performed to evaluate left ventricular wall motion, and estimate left ventricular ejection fraction. COMPARISON:  None available FINDINGS: Perfusion: Allowing for anterior apical breast attenuation, there is No significant decreased activity in the left ventricle on stress imaging to suggest reversible ischemia or infarction. Wall Motion: Normal left ventricular wall motion. No left ventricular dilation. Left Ventricular Ejection Fraction: 58 % End diastolic volume 83 ml End systolic volume 35 ml IMPRESSION: 1. No reversible ischemia or infarction. 2. Normal left ventricular wall motion. 3. Left ventricular ejection fraction 58% 4. Low-risk stress test findings*. *2012 Appropriate Use Criteria for Coronary Revascularization Focused Update: J Am Coll Cardiol. 202119;41(7):[email or web address] Electronically Signed   By: M.Jerilynn Mages Shick M.D.   On: 02/19/2016 15:10     Assessment & Plan:  Plan I have changed Ms. Gibbons's NOVOLIN R RELION to insulin regular. I have also changed her gabapentin. I am also having her maintain her Insulin Syringe-Needle U-100, PEN NEEDLES 31GX5/16", NOVOLIN N RELION, multivitamin with minerals, Lancing Device, TAI DOC CONTROL, aspirin EC, PHARMACIST CHOICE LANCETS, omega-3 acid ethyl esters, glucose blood, (Flaxseed, Linseed, (FLAXSEED OIL PO)), meclizine, ondansetron, alendronate, CINNAMON PO, GINKGO BILOBA COMPLEX PO, FLUoxetine, ALPRAZolam, levocetirizine, fluticasone, ACCU-CHEK AVIVA PLUS, atorvastatin, amLODipine, ferrous sulfate, Cyanocobalamin (VITAMIN B-12 PO), vitamin E, Carboxymethylcellulose Sodium (THERATEARS OP), ibuprofen, metoprolol succinate, lisinopril, and HYDROcodone-acetaminophen.  Meds ordered this encounter  Medications  . insulin  regular (NOVOLIN R RELION) 100 units/mL injection    Sig: Inject 0.3 mLs (30 Units total) into the skin daily as needed for high blood sugar (at noon if CBG >200 and will be eating).    Dispense:  10 mL  . metoprolol succinate (TOPROL-XL) 50 MG 24 hr tablet    Sig: take 1 tablet by mouth once daily with food OR IMMEDIATELY FOLLOWING A MEAL    Dispense:  90 tablet    Refill:  1  . lisinopril (PRINIVIL,ZESTRIL) 10 MG tablet    Sig: Take 1 tablet (10 mg total) by mouth daily.    Dispense:  90 tablet  Refill:  3  . HYDROcodone-acetaminophen (NORCO) 5-325 MG tablet    Sig: Take 1 tablet by mouth every 6 (six) hours as needed for moderate pain.    Dispense:  30 tablet    Refill:  0  . gabapentin (NEURONTIN) 100 MG capsule    Sig: Take 1 capsule (100 mg total) by mouth 2 (two) times daily.    Refill:  0    Problem List Items Addressed This Visit    Chest pain    resolved      Essential hypertension   Relevant Medications   metoprolol succinate (TOPROL-XL) 50 MG 24 hr tablet   lisinopril (PRINIVIL,ZESTRIL) 10 MG tablet   Tobacco abuse    Counseled on cessation        Other Visit Diagnoses    Right knee pain    -  Primary    Relevant Medications    HYDROcodone-acetaminophen (NORCO) 5-325 MG tablet    DM (diabetes mellitus) type II uncontrolled, periph vascular disorder (HCC)        Relevant Medications    insulin regular (NOVOLIN R RELION) 100 units/mL injection    metoprolol succinate (TOPROL-XL) 50 MG 24 hr tablet    lisinopril (PRINIVIL,ZESTRIL) 10 MG tablet    gabapentin (NEURONTIN) 100 MG capsule       Follow-up: Return in about 3 months (around 06/01/2016), or if symptoms worsen or fail to improve, for hypertension, hyperlipidemia, diabetes II.  Ann Held, DO

## 2016-03-10 NOTE — Assessment & Plan Note (Signed)
Counseled on cessation 

## 2016-03-10 NOTE — Assessment & Plan Note (Signed)
resolved 

## 2016-03-19 IMAGING — CR DG KNEE COMPLETE 4+V*R*
4 series · 4 of 4 positions shown · non-contrast
Comparison: 05/27/2008

CLINICAL DATA: Right knee pain and swelling for 2 days

EXAM:
RIGHT KNEE - COMPLETE 4+ VIEW

[t knee ap right]
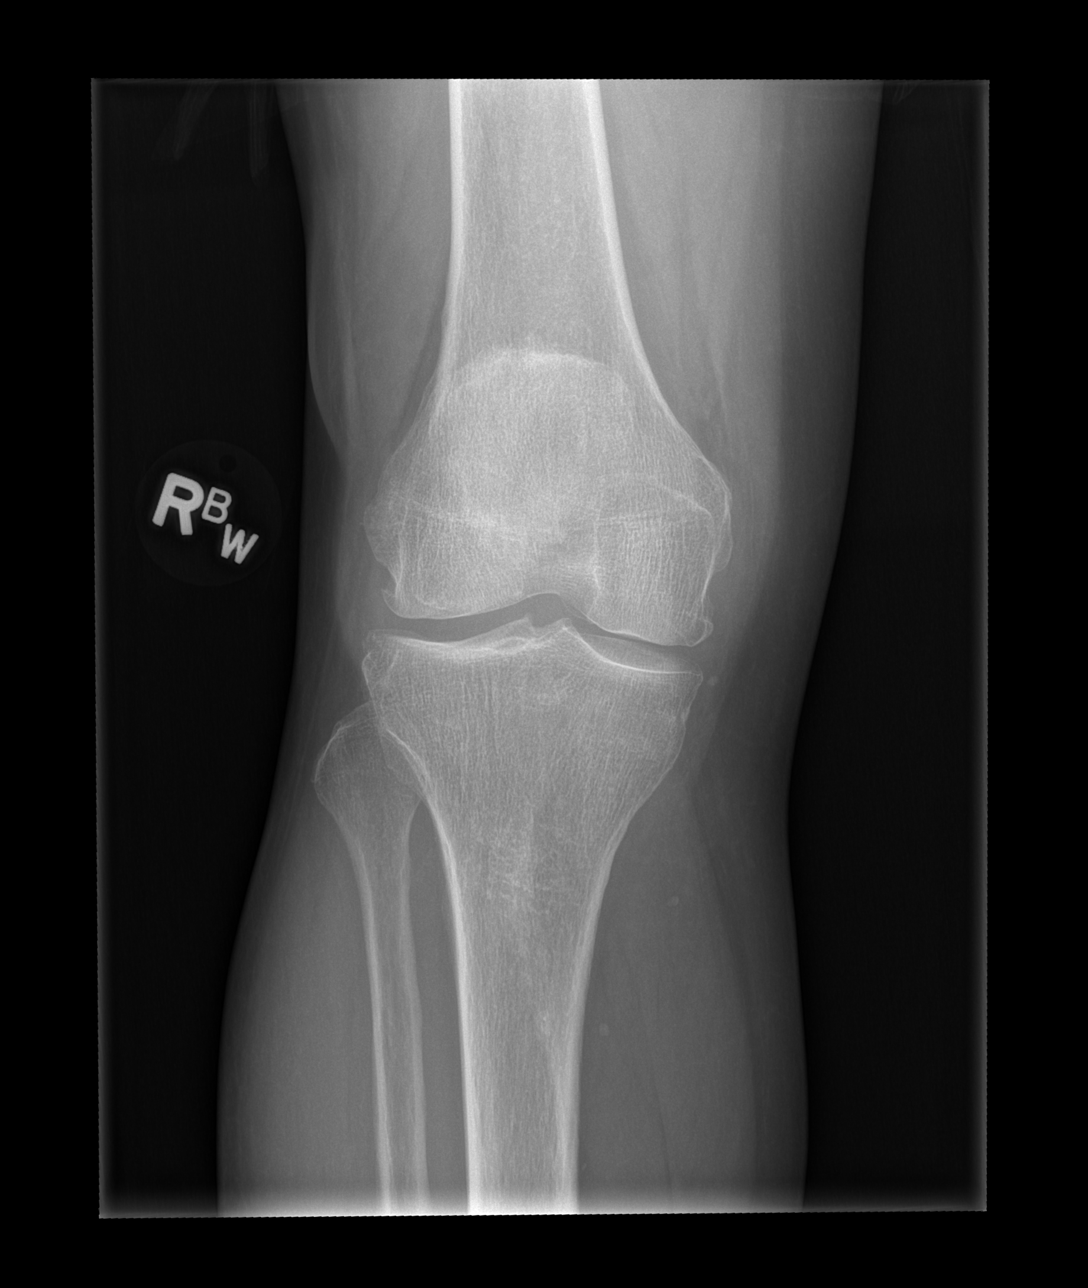

[t knee obl right (1 of 2)]
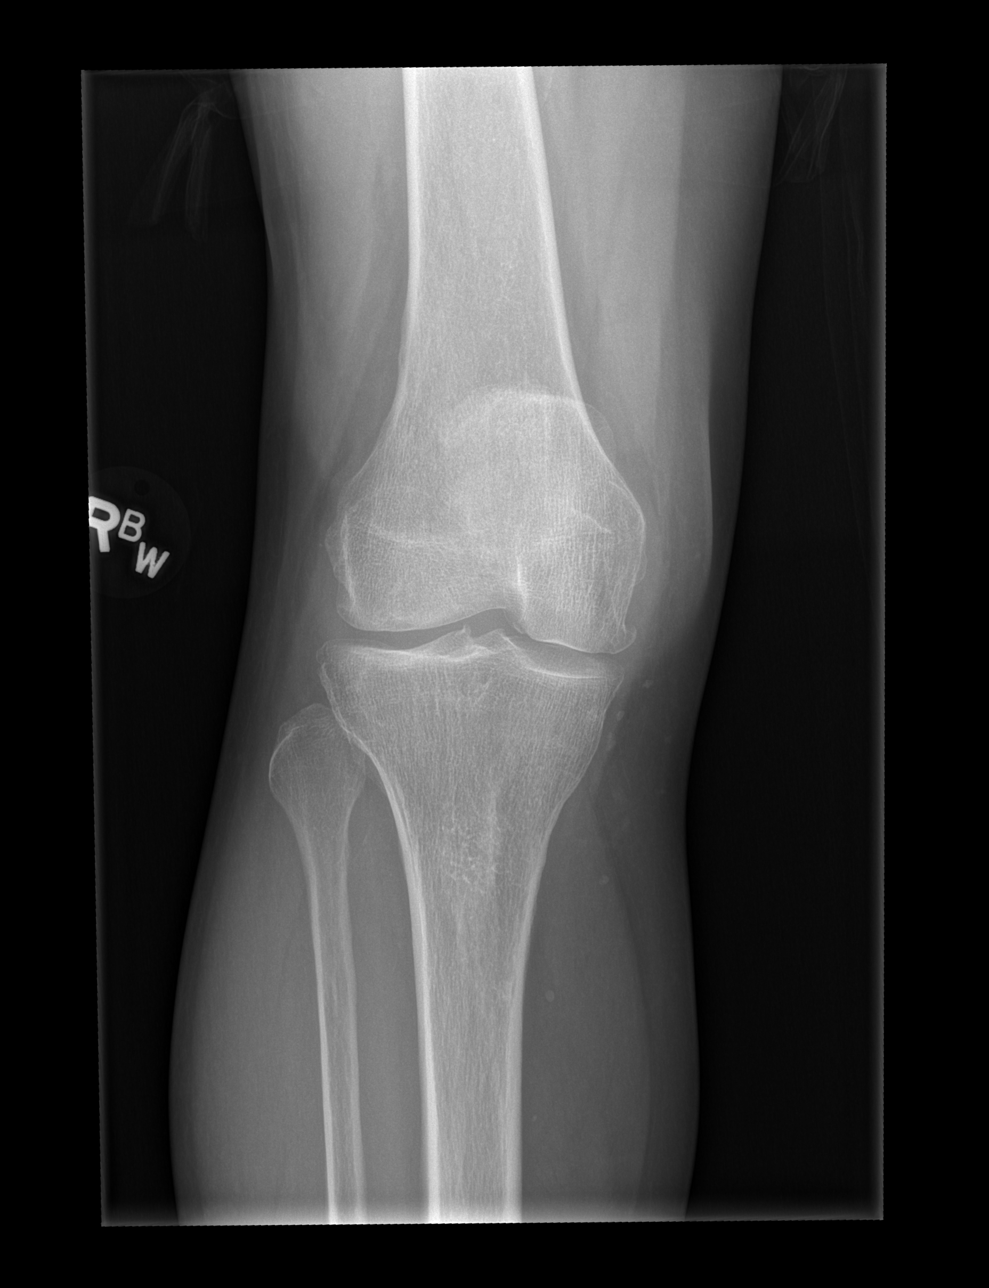

[t knee obl right (2 of 2)]
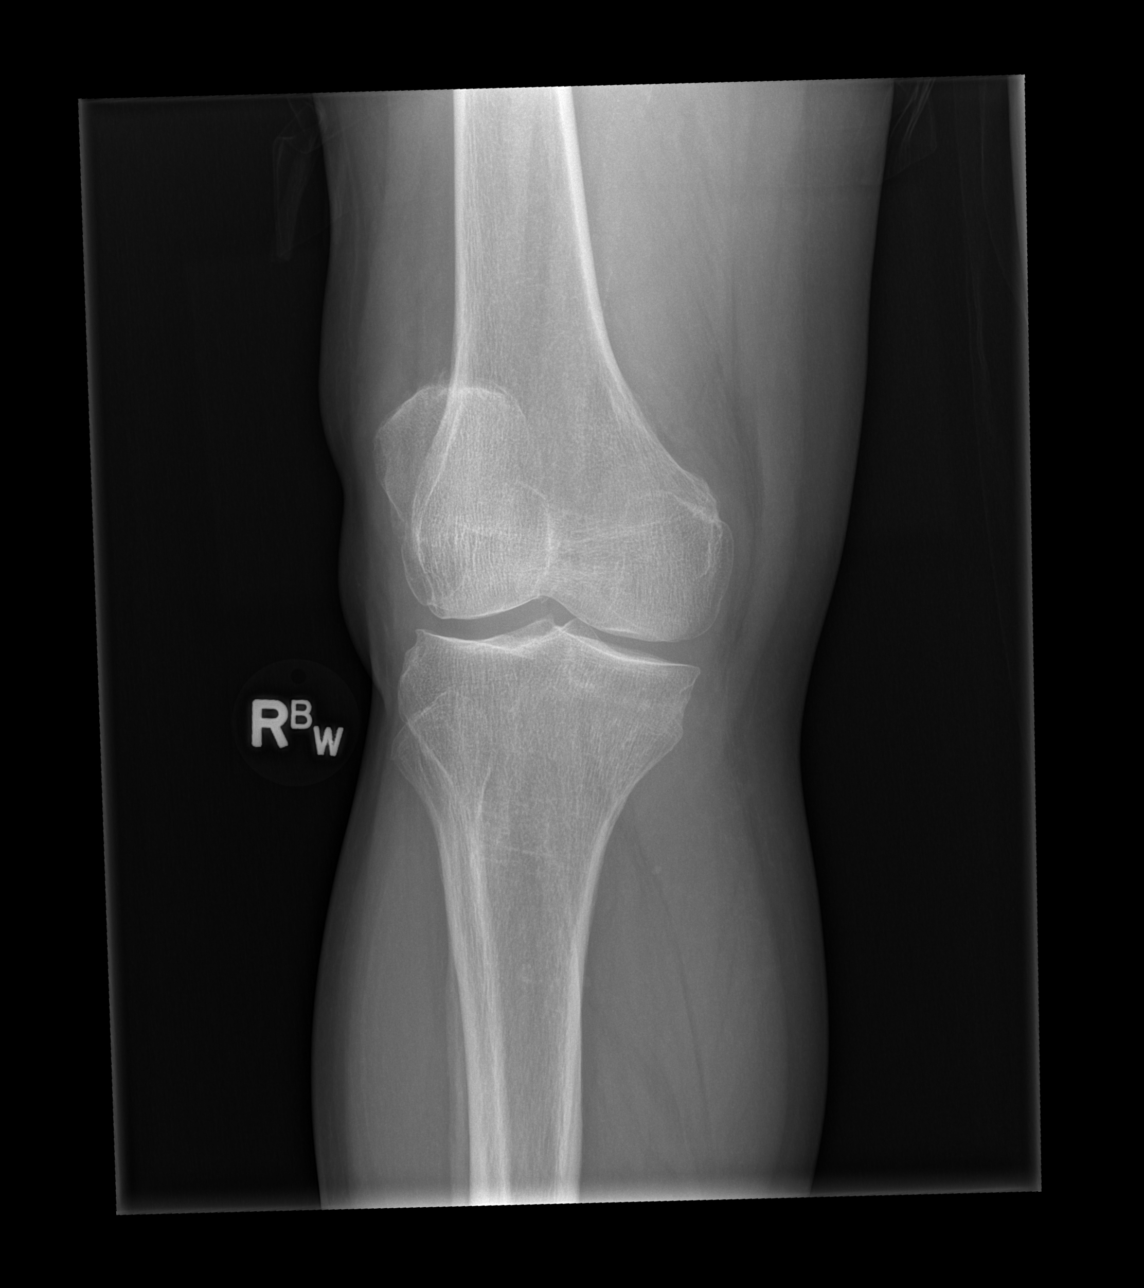

[t knee lat right]
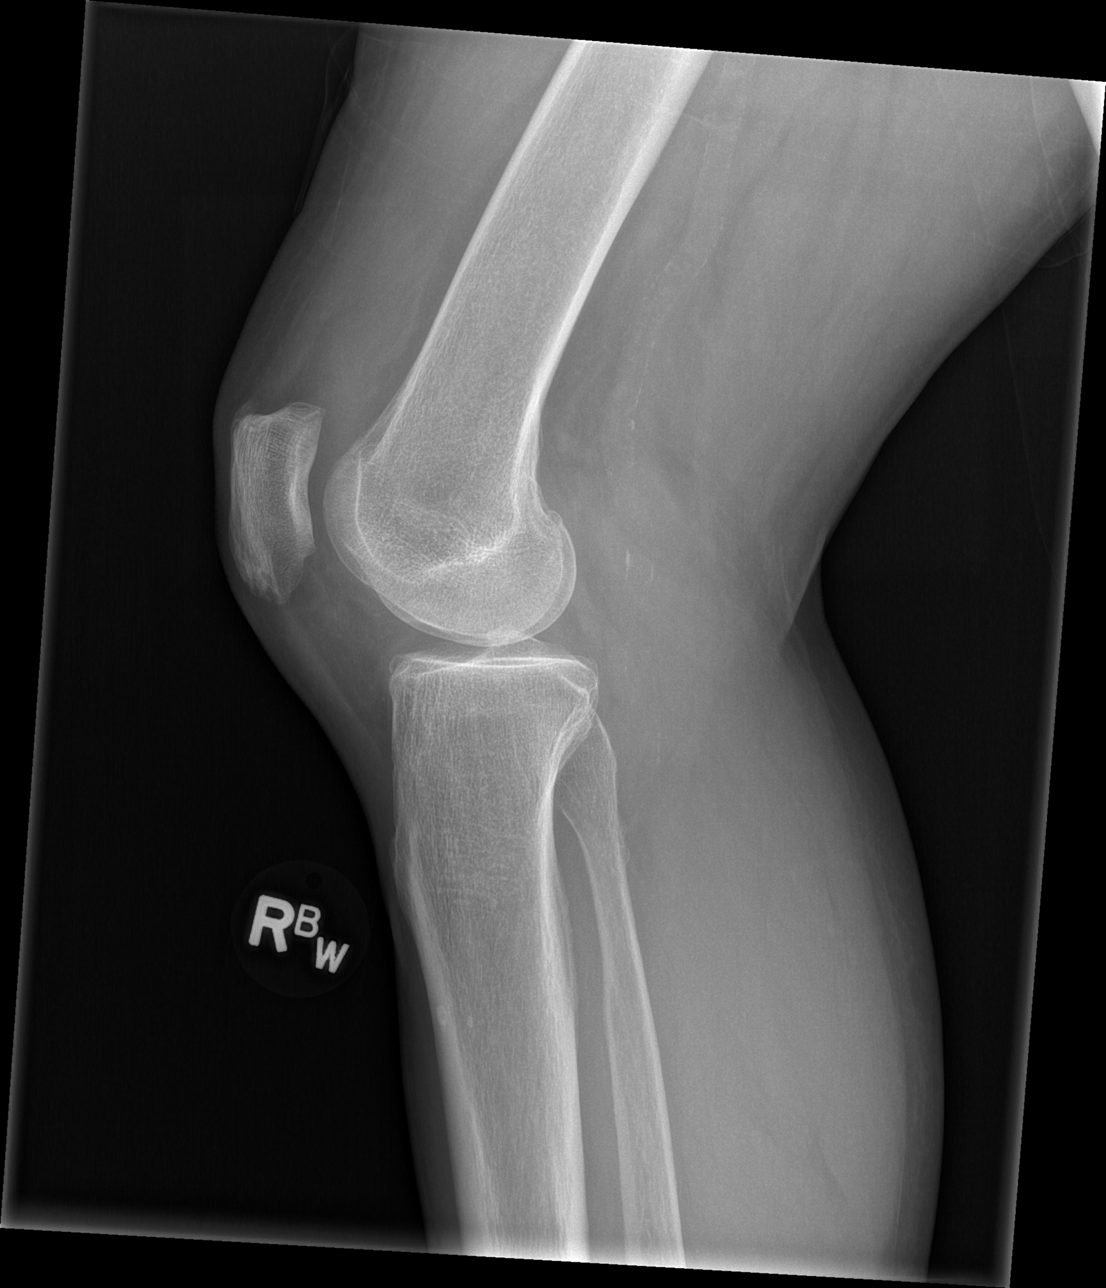

[4 of 4 positions shown; findings below may reference images not displayed]

FINDINGS: Minimal tricompartmental degenerative change. No fracture or
dislocation. Tibial spine spurring is identified. Trace
suprapatellar fluid. Vascular calcifications.
IMPRESSION: Minimal tricompartmental degenerative change without focal acute
finding.

## 2016-04-09 ENCOUNTER — Other Ambulatory Visit: Payer: Self-pay | Admitting: Family Medicine

## 2016-04-25 DIAGNOSIS — I10 Essential (primary) hypertension: Secondary | ICD-10-CM | POA: Diagnosis not present

## 2016-04-25 DIAGNOSIS — E1165 Type 2 diabetes mellitus with hyperglycemia: Secondary | ICD-10-CM | POA: Diagnosis not present

## 2016-04-25 DIAGNOSIS — E049 Nontoxic goiter, unspecified: Secondary | ICD-10-CM | POA: Diagnosis not present

## 2016-04-25 DIAGNOSIS — E7800 Pure hypercholesterolemia, unspecified: Secondary | ICD-10-CM | POA: Diagnosis not present

## 2016-05-02 DIAGNOSIS — E1165 Type 2 diabetes mellitus with hyperglycemia: Secondary | ICD-10-CM | POA: Diagnosis not present

## 2016-05-27 ENCOUNTER — Ambulatory Visit (HOSPITAL_COMMUNITY)
Admission: EM | Admit: 2016-05-27 | Discharge: 2016-05-27 | Disposition: A | Discharge status: Home / Self Care | Payer: Commercial Managed Care - HMO | Attending: Internal Medicine | Admitting: Internal Medicine

## 2016-05-27 ENCOUNTER — Encounter (HOSPITAL_COMMUNITY): Payer: Self-pay | Admitting: Nurse Practitioner

## 2016-05-27 DIAGNOSIS — S46812A Strain of other muscles, fascia and tendons at shoulder and upper arm level, left arm, initial encounter: Secondary | ICD-10-CM | POA: Diagnosis not present

## 2016-05-27 MED ORDER — CYCLOBENZAPRINE HCL 10 MG PO TABS: 10.0000 mg | ORAL_TABLET | Freq: Two times a day (BID) | ORAL | 0 refills | Status: DC | PRN

## 2016-05-27 MED ORDER — HYDROCODONE-ACETAMINOPHEN 7.5-325 MG PO TABS: 1.0000 | ORAL_TABLET | Freq: Four times a day (QID) | ORAL | 0 refills | Status: DC | PRN

## 2016-05-27 NOTE — ED Notes (Signed)
Discharge instructions reviewed w/ pt per PA.

## 2016-05-27 NOTE — ED Provider Notes (Signed)
CSN: [id number]     Arrival date & time 05/27/16  1610 History   First MD Initiated Contact with Patient 05/27/16 1639     Chief Complaint  Patient presents with  . Shoulder Injury   (Consider location/radiation/quality/duration/timing/severity/associated sxs/prior Treatment) HPI  Past Medical History:  Diagnosis Date  . Blockage of coronary artery of heart ([place])   . Depression   . Diabetes mellitus   . Hyperlipidemia   . Hypertension   . Pancreatitis   . Restless leg syndrome    Past Surgical History:  Procedure Laterality Date  . CORONARY ANGIOPLASTY WITH STENT PLACEMENT    . FOOT SURGERY Right    cyst removal  . ROTATOR CUFF REPAIR     right  . TUBAL LIGATION     Family History  Problem Relation Age of Onset  . Heart attack Father   . Lung cancer    . Thyroid cancer    . Uterine cancer    . Breast cancer Sister   . Cancer Other     lung,thyroid,breast,uterine  . Heart attack Sister   . Heart attack Brother 83   Social History  Substance Use Topics  . Smoking status: Current Some Day Smoker    Packs/day: 0.00  . Smokeless tobacco: Never Used  . Alcohol use No   OB History    No data available     Review of Systems  Allergies  Levemir [insulin detemir] and Metformin and related  Home Medications   Prior to Admission medications   Medication Sig Start Date End Date Taking? Authorizing Provider  alendronate (FOSAMAX) 70 MG tablet take 1 tablet by mouth every week on an empty stomach with 6-8 oz of water. No food / meds for 30 min. Remain Upright Patient taking differently: take 1 tablet by mouth every Sunday on an empty stomach with 6-8 oz of water. No food / meds for 30 min. Remain Upright 07/04/15   Rosalita Chessman Chase, DO  ALPRAZolam Duanne Moron) 0.25 MG tablet take 1 tablet by mouth three times a day if needed Patient taking differently: take 1 tablet by mouth daily  if needed for anxiety 09/23/15   Rosalita Chessman Chase, DO  amLODipine (NORVASC) 10 MG  tablet take 1 tablet by mouth once daily 04/09/16   Rosalita Chessman Chase, DO  aspirin EC 81 MG tablet Take 81 mg by mouth daily.    Historical Provider, MD  atorvastatin (LIPITOR) 40 MG tablet take 1 tablet by mouth once daily Patient taking differently: take 1 tablet by mouth once daily at bedtime 01/17/16   Rosalita Chessman Chase, DO  Blood Glucose Calibration (TAI [facility] CONTROL) NORMAL SOLN  10/19/12   Historical Provider, MD  Blood Glucose Monitoring Suppl (ACCU-CHEK AVIVA PLUS) w/Device KIT Check blood sugar daily. E11.9 11/25/15   Alferd Apa Lowne Chase, DO  Carboxymethylcellulose Sodium (THERATEARS OP) Place 1 drop into both eyes daily as needed (dry eyes).    Historical Provider, MD  CINNAMON PO Take 1 tablet by mouth daily.     Historical Provider, MD  Cyanocobalamin (VITAMIN B-12 PO) Take 1 tablet by mouth daily.    Historical Provider, MD  cyclobenzaprine (FLEXERIL) 10 MG tablet Take 1 tablet (10 mg total) by mouth 2 (two) times daily as needed for muscle spasms. 05/27/16   Konrad Felix, PA  ferrous sulfate 325 (65 FE) MG tablet Take 325 mg by mouth daily with breakfast.    Historical Provider, MD  Flaxseed,  Linseed, (FLAXSEED OIL PO) Take 1 capsule by mouth 2 (two) times daily.     Historical Provider, MD  FLUoxetine (PROZAC) 20 MG capsule take 1 capsule by mouth once daily 09/22/15   Alferd Apa Lowne Chase, DO  fluticasone [facility]) 50 MCG/ACT nasal spray Place 2 sprays into both nostrils daily. 11/14/15   Rosalita Chessman Chase, DO  gabapentin (NEURONTIN) 100 MG capsule Take 1 capsule (100 mg total) by mouth 2 (two) times daily. 03/01/16   Alferd Apa Lowne Chase, DO  GINKGO BILOBA COMPLEX PO Take 1 tablet by mouth daily.     Historical Provider, MD  glucose blood (ACCU-CHEK AVIVA) test strip Check blood sugar four times daily. E11.9 12/14/14   Rosalita Chessman Chase, DO  HYDROcodone-acetaminophen (NORCO) 5-325 MG tablet Take 1 tablet by mouth every 6 (six) hours as needed for moderate pain. 03/01/16   Rosalita Chessman Chase, DO  HYDROcodone-acetaminophen (NORCO) 7.5-325 MG tablet Take 1 tablet by mouth every 6 (six) hours as needed for moderate pain. 05/27/16   Konrad Felix, PA  ibuprofen (ADVIL,MOTRIN) 200 MG tablet Take 400 mg by mouth every 6 (six) hours as needed (pain).    Historical Provider, MD  Insulin Pen Needle (PEN NEEDLES 31GX5/16") 31G X 8 MM MISC by Does not apply route daily.      Historical Provider, MD  insulin regular (NOVOLIN R RELION) 100 units/mL injection Inject 0.3 mLs (30 Units total) into the skin daily as needed for high blood sugar (at noon if CBG >200 and will be eating). 03/01/16   Rosalita Chessman Chase, DO  Insulin Syringe-Needle U-100 (BD INSULIN SYRINGE ULTRAFINE) 31G X 5/16" 0.3 ML MISC 10 Units by Does not apply route 3 (three) times daily before meals.      Historical Provider, MD  Lancet Devices (LANCING DEVICE) MISC  10/19/12   Historical Provider, MD  levocetirizine (XYZAL) 5 MG tablet Take 1 tablet (5 mg total) by mouth every evening. 11/14/15   Rosalita Chessman Chase, DO  lisinopril (PRINIVIL,ZESTRIL) 10 MG tablet Take 1 tablet (10 mg total) by mouth daily. 03/01/16   Rosalita Chessman Chase, DO  meclizine (ANTIVERT) 25 MG tablet Take 1 tablet (25 mg total) by mouth 3 (three) times daily as needed for dizziness. 12/31/14   Hanna Patel-Mills, PA-C  metoprolol succinate (TOPROL-XL) 50 MG 24 hr tablet take 1 tablet by mouth once daily with food OR IMMEDIATELY FOLLOWING A MEAL 03/01/16   Rosalita Chessman Chase, DO  Multiple Vitamin (MULTIVITAMIN WITH MINERALS) TABS Take 1 tablet by mouth daily.    Historical Provider, MD  NOVOLIN N RELION 100 UNIT/ML injection Inject 30-60 Units into the skin 2 (two) times daily. Inject 30 units subcutaneously before breakfast and 60 units at bedtime 05/05/12   Historical Provider, MD  omega-3 acid ethyl esters (LOVAZA) 1 G capsule Take 2 capsules (2 g total) by mouth 2 (two) times daily. 09/17/14   Alferd Apa Lowne Chase, DO  ondansetron (ZOFRAN) 4 MG  tablet Take 1 tablet (4 mg total) by mouth every 6 (six) hours. 12/31/14   Ottie Glazier, PA-C  PHARMACIST CHOICE LANCETS MISC  10/31/13   Historical Provider, MD  vitamin E 400 UNIT capsule Take 400 Units by mouth daily.    Historical Provider, MD   Meds Ordered and Administered this Visit  Medications - No data to display  BP 167/66 (BP Location: Left Arm)   Pulse 65   Temp 98.5 F (36.9  C) (Oral)   Resp 20   SpO2 98%  No data found.   Physical Exam  Urgent Care Course   Clinical Course    Procedures (including critical care time)  Labs Review Labs Reviewed - No data to display  Imaging Review No results found.   Visual Acuity Review  Right Eye Distance:   Left Eye Distance:   Bilateral Distance:    Right Eye Near:   Left Eye Near:    Bilateral Near:        Patient with atraumatic onset of left neck pain after a work injury. Prescription for Flexeril is provided. Patient does not require imaging studies at this time. MDM   1. Trapezius strain, left, initial encounter     Patient is reassured that there are no issues that require transfer to higher level of care at this time or additional tests. Patient is advised to continue home symptomatic treatment. Patient is advised that if there are new or worsening symptoms to attend the emergency department, contact primary care provider, or return to UC. Instructions of care provided discharged home in stable condition.    THIS NOTE WAS GENERATED USING A VOICE RECOGNITION SOFTWARE PROGRAM. ALL REASONABLE EFFORTS  WERE MADE TO PROOFREAD THIS DOCUMENT FOR ACCURACY.  I have verbally reviewed the discharge instructions with the patient. A printed AVS was given to the patient.  All questions were answered prior to discharge.      Konrad Felix, [place] 05/27/16 1958

## 2016-05-27 NOTE — ED Triage Notes (Signed)
She c/o 1 day history of L shoulder pain. 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 The pain radiates into her L neck and head. She has tried muscle relaxer and vicodin at home with no relief. She is alert and breathing easily.

## 2016-05-29 ENCOUNTER — Encounter: Payer: Self-pay | Admitting: Medical

## 2016-05-29 ENCOUNTER — Ambulatory Visit (HOSPITAL_BASED_OUTPATIENT_CLINIC_OR_DEPARTMENT_OTHER)
Admission: RE | Admit: 2016-05-29 | Discharge: 2016-05-29 | Disposition: A | Discharge status: Home / Self Care | Payer: Commercial Managed Care - HMO | Source: Ambulatory Visit | Attending: Medical | Admitting: Medical

## 2016-05-29 ENCOUNTER — Ambulatory Visit (INDEPENDENT_AMBULATORY_CARE_PROVIDER_SITE_OTHER): Payer: Commercial Managed Care - HMO | Admitting: Medical

## 2016-05-29 VITALS — BP 152/68 | HR 59 | Temp 98.4°F | Ht 65.0 in | Wt 175.0 lb

## 2016-05-29 DIAGNOSIS — M25572 Pain in left ankle and joints of left foot: Secondary | ICD-10-CM | POA: Insufficient documentation

## 2016-05-29 DIAGNOSIS — M79662 Pain in left lower leg: Secondary | ICD-10-CM | POA: Diagnosis not present

## 2016-05-29 DIAGNOSIS — M7732 Calcaneal spur, left foot: Secondary | ICD-10-CM | POA: Diagnosis not present

## 2016-05-29 DIAGNOSIS — M79605 Pain in left leg: Secondary | ICD-10-CM

## 2016-05-29 DIAGNOSIS — R600 Localized edema: Secondary | ICD-10-CM | POA: Diagnosis not present

## 2016-05-29 MED ORDER — KETOROLAC TROMETHAMINE 60 MG/2ML IM SOLN
60.0000 mg | Freq: Once | INTRAMUSCULAR | Status: AC
  Administered 2016-05-29: 60 mg via INTRAMUSCULAR

## 2016-05-29 MED ORDER — DICLOFENAC POTASSIUM 50 MG PO TABS: 50.0000 mg | ORAL_TABLET | Freq: Two times a day (BID) | ORAL | 0 refills | Status: DC

## 2016-05-29 NOTE — Patient Instructions (Addendum)
For your left calf/lower ext  pain we put in US left lower ext. Get test today.(Scheduled at 4:30 pm)  Also get xray tibia and fibula.  For foot pain get xray of your foot.(For small lump in left foot will go ahead and refer to podiatrist.)  We gave you torodol 60 mg im for pain in office. Then tomorrow start diclofenac. You have hydrocodone and flexeril from ED for your trapezius pain. Can continue as they advised. This may reduce pain in lower ext as well.  Your bp is mild high today. Please resume your regular bp meds.  Follow up 7-10 days or as needed

## 2016-05-29 NOTE — Progress Notes (Signed)
Subjective:    Patient ID: [name], female    DOB: 1946-09-12, 70 y.o.   MRN: [id number]  HPI   Pt left leg pain for about a 2 and half weeks. Pain not every night. But last night pain felt more severe. She is pointing to upper calf. But then states pain feels like coming from lower calf area and radiating upwards. No fall or injury. No new exercise, no new activity and no trauma. Pt staes could not sleep due to pain.  Some also states pain in bottom of her left foot for about 10 days.  Pt does have some left shoulder pain. She is on hydrocodone and flexeril for that. She has been on hydrocodone since Sunday. They saw her for trapezius strain.  Pt currently not taking any nsaids. On review of her chart appears to have ok gfr.      Review of Systems  Constitutional: Negative for chills, fatigue and fever.  Respiratory: Negative for cough, chest tightness, shortness of breath and wheezing.   Cardiovascular: Negative for chest pain and palpitations.  Gastrointestinal: Negative for abdominal pain.  Musculoskeletal:       See hpi.  Skin: Negative for rash.  Neurological: Negative for dizziness and headaches.  Hematological: Negative for adenopathy. Does not bruise/bleed easily.  Psychiatric/Behavioral: Negative for agitation and confusion.   Past Medical History:  Diagnosis Date  . Blockage of coronary artery of heart ([place])   . Depression   . Diabetes mellitus   . Hyperlipidemia   . Hypertension   . Pancreatitis   . Restless leg syndrome      Social History   Social History  . Marital status: Married    Spouse name: N/A  . Number of children: N/A  . Years of education: N/A   Occupational History  . Not on file.   Social History Main Topics  . Smoking status: Current Some Day Smoker    Packs/day: 0.00  . Smokeless tobacco: Never Used  . Alcohol use No  . Drug use: No  . Sexual activity: Not on file   Other Topics Concern  . Not on file   Social  History Narrative   Exercise: seldom   Caffeine use: seldom    Past Surgical History:  Procedure Laterality Date  . CORONARY ANGIOPLASTY WITH STENT PLACEMENT    . FOOT SURGERY Right    cyst removal  . ROTATOR CUFF REPAIR     right  . TUBAL LIGATION      Family History  Problem Relation Age of Onset  . Heart attack Father   . Lung cancer    . Thyroid cancer    . Uterine cancer    . Breast cancer Sister   . Cancer Other     lung,thyroid,breast,uterine  . Heart attack Sister   . Heart attack Brother 47    Allergies  Allergen Reactions  . Levemir [Insulin Detemir] Hives and Swelling  . Metformin And Related Other (See Comments)    Sick on stomach, sweaty, throw up, pain    Current Outpatient Prescriptions on File Prior to Visit  Medication Sig Dispense Refill  . alendronate (FOSAMAX) 70 MG tablet take 1 tablet by mouth every week on an empty stomach with 6-8 oz of water. No food / meds for 30 min. Remain Upright (Patient taking differently: take 1 tablet by mouth every Sunday on an empty stomach with 6-8 oz of water. No food / meds for 30 min. Remain  Upright) 4 tablet 11  . amLODipine (NORVASC) 10 MG tablet take 1 tablet by mouth once daily 30 tablet 5  . aspirin EC 81 MG tablet Take 81 mg by mouth daily.    [name] atorvastatin (LIPITOR) 40 MG tablet take 1 tablet by mouth once daily (Patient taking differently: take 1 tablet by mouth once daily at bedtime) 30 tablet 5  . Blood Glucose Calibration (TAI DOC CONTROL) NORMAL SOLN     . Blood Glucose Monitoring Suppl (ACCU-CHEK AVIVA PLUS) w/Device KIT Check blood sugar daily. E11.9 1 kit 0  . Carboxymethylcellulose Sodium (THERATEARS OP) Place 1 drop into both eyes daily as needed (dry eyes).    . CINNAMON PO Take 1 tablet by mouth daily.     . Cyanocobalamin (VITAMIN B-12 PO) Take 1 tablet by mouth daily.    . cyclobenzaprine (FLEXERIL) 10 MG tablet Take 1 tablet (10 mg total) by mouth 2 (two) times daily as needed for muscle  spasms. 20 tablet 0  . ferrous sulfate 325 (65 FE) MG tablet Take 325 mg by mouth daily with breakfast.    . Flaxseed, Linseed, (FLAXSEED OIL PO) Take 1 capsule by mouth 2 (two) times daily.     [name] FLUoxetine (PROZAC) 20 MG capsule take 1 capsule by mouth once daily 30 capsule 5  . fluticasone (FLONASE) 50 MCG/ACT nasal spray Place 2 sprays into both nostrils daily. 16 g 6  . gabapentin (NEURONTIN) 100 MG capsule Take 1 capsule (100 mg total) by mouth 2 (two) times daily.  0  . GINKGO BILOBA COMPLEX PO Take 1 tablet by mouth daily.     [name] glucose blood (ACCU-CHEK AVIVA) test strip Check blood sugar four times daily. E11.9 400 each 3  . HYDROcodone-acetaminophen (NORCO) 5-325 MG tablet Take 1 tablet by mouth every 6 (six) hours as needed for moderate pain. 30 tablet 0  . ibuprofen (ADVIL,MOTRIN) 200 MG tablet Take 400 mg by mouth every 6 (six) hours as needed (pain).    . Insulin Pen Needle (PEN NEEDLES 31GX5/16") 31G X 8 MM MISC by Does not apply route daily.      . insulin regular (NOVOLIN R RELION) 100 units/mL injection Inject 0.3 mLs (30 Units total) into the skin daily as needed for high blood sugar (at noon if CBG >200 and will be eating). 10 mL   . Insulin Syringe-Needle U-100 (BD INSULIN SYRINGE ULTRAFINE) 31G X 5/16" 0.3 ML MISC 10 Units by Does not apply route 3 (three) times daily before meals.      Elmore Guise Devices (LANCING DEVICE) MISC     . levocetirizine (XYZAL) 5 MG tablet Take 1 tablet (5 mg total) by mouth every evening. 30 tablet 5  . lisinopril (PRINIVIL,ZESTRIL) 10 MG tablet Take 1 tablet (10 mg total) by mouth daily. 90 tablet 3  . metoprolol succinate (TOPROL-XL) 50 MG 24 hr tablet take 1 tablet by mouth once daily with food OR IMMEDIATELY FOLLOWING A MEAL 90 tablet 1  . Multiple Vitamin (MULTIVITAMIN WITH MINERALS) TABS Take 1 tablet by mouth daily.    [name] NOVOLIN N RELION 100 UNIT/ML injection Inject 30-60 Units into the skin 2 (two) times daily. Inject 30 units subcutaneously  before breakfast and 60 units at bedtime    . PHARMACIST CHOICE LANCETS MISC     . vitamin E 400 UNIT capsule Take 400 Units by mouth daily.    [name] ALPRAZolam (XANAX) 0.25 MG tablet take 1 tablet by mouth three times a day  if needed (Patient not taking: Reported on 05/29/2016) 60 tablet 0  . HYDROcodone-acetaminophen (NORCO) 7.5-325 MG tablet Take 1 tablet by mouth every 6 (six) hours as needed for moderate pain. (Patient not taking: Reported on 05/29/2016) 30 tablet 0  . meclizine (ANTIVERT) 25 MG tablet Take 1 tablet (25 mg total) by mouth 3 (three) times daily as needed for dizziness. 10 tablet 0  . omega-3 acid ethyl esters (LOVAZA) 1 G capsule Take 2 capsules (2 g total) by mouth 2 (two) times daily. (Patient not taking: Reported on 05/29/2016) 120 capsule 5  . ondansetron (ZOFRAN) 4 MG tablet Take 1 tablet (4 mg total) by mouth every 6 (six) hours. (Patient not taking: Reported on 05/29/2016) 12 tablet 0   No current facility-administered medications on file prior to visit.     BP (!) 152/68   Pulse (!) 59   Temp 98.4 F (36.9 C) (Oral)   Ht _0  (1.651 m)   Wt 175 lb (79.4 kg)   SpO2 100%   BMI 29.12 kg/m       Objective:   Physical Exam  General Mental Status- Alert. General Appearance- Not in acute distress.   Chest and Lung Exam Auscultation: Breath Sounds:-Normal.  Cardiovascular Auscultation:Rythm- Regular. Murmurs & Other Heart Sounds:Auscultation of the heart reveals- No Murmurs.  Abdomen Inspection:-Inspeection Normal. Palpation/Percussion:Note:No mass. Palpation and Percussion of the abdomen reveal- Non Tender, Non Distended + BS, no rebound or guarding.    Neurologic Cranial Nerve exam:- CN III-XII intact(No nystagmus), symmetric smile. Strength:- 5/5 equal and symmetric strength both upper and lower extremities.  Left knee- no pain on palpation or rom. Left calf- faint tender to palpation lateral aspect. From distal calf all the way up to proximal aspect.  Neg homan sign.  Lt foot- mid aspect small lump felt. About 4-5 mm in size approximate.      Assessment & Plan:  For your left calf/lower ext  pain we put in [name] left lower ext. Get test today.  Also get xray tibia and fibula.  For foot pain get xray of your foot.(For small lump in left foot will go ahead and refer to podiatrist.)  We gave you torodol 60 mg im for pain in office. Then tomorrow start diclofenac. You have hydrocodone and flexeril from ED for your trapezius pain. Can continue as they advised. This may reduce pain in lower ext as well.  Your bp is mild high today. Please resume your regular bp meds.  Follow up 7-10 days or as needed  Mahari Vankirk, Percell Miller, [employer]

## 2016-05-29 NOTE — Progress Notes (Signed)
Pre visit review using our clinic tool,if applicable. No additional management support is needed unless otherwise documented below in the visit note.  

## 2016-05-31 ENCOUNTER — Ambulatory Visit (HOSPITAL_COMMUNITY)
Admission: EM | Admit: 2016-05-31 | Discharge: 2016-05-31 | Discharge status: Absent without leave | Payer: Commercial Managed Care - HMO

## 2016-05-31 DIAGNOSIS — Z5321 Procedure and treatment not carried out due to patient leaving prior to being seen by health care provider: Secondary | ICD-10-CM

## 2016-06-01 ENCOUNTER — Telehealth: Payer: Self-pay | Admitting: Family Medicine

## 2016-06-01 NOTE — Telephone Encounter (Signed)
Relation to ZO:XWRUpt:self Call back [phone]   Reason for call:  Patient inquiring about imaging results

## 2016-06-07 ENCOUNTER — Encounter: Payer: Commercial Managed Care - HMO | Admitting: Podiatry

## 2016-06-08 ENCOUNTER — Other Ambulatory Visit: Payer: Self-pay | Admitting: Family Medicine

## 2016-06-08 NOTE — Telephone Encounter (Signed)
Patient has been informed of results. 

## 2016-06-15 ENCOUNTER — Ambulatory Visit (INDEPENDENT_AMBULATORY_CARE_PROVIDER_SITE_OTHER): Payer: Commercial Managed Care - HMO | Admitting: Family Medicine

## 2016-06-15 ENCOUNTER — Encounter: Payer: Self-pay | Admitting: Family Medicine

## 2016-06-15 VITALS — BP 122/80 | Temp 98.3°F | Resp 16 | Ht 65.0 in | Wt 172.6 lb

## 2016-06-15 DIAGNOSIS — S46912A Strain of unspecified muscle, fascia and tendon at shoulder and upper arm level, left arm, initial encounter: Secondary | ICD-10-CM | POA: Diagnosis not present

## 2016-06-15 MED ORDER — TIZANIDINE HCL 4 MG PO TABS: 4.0000 mg | ORAL_TABLET | Freq: Four times a day (QID) | ORAL | 0 refills | Status: DC | PRN

## 2016-06-15 MED ORDER — MELOXICAM 15 MG PO TABS: ORAL_TABLET | ORAL | 0 refills | Status: DC

## 2016-06-15 NOTE — Progress Notes (Signed)
Pre visit review using our clinic review tool, if applicable. No additional management support is needed unless otherwise documented below in the visit note. 

## 2016-06-15 NOTE — Progress Notes (Signed)
Patient ID: [name], female    DOB: 19-Oct-1945  Age: 70 y.o. MRN: [id number]    Subjective:  Subjective  HPI [name] presents for f/u R shoulder pain.  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   She had a pulling in the L shoulder and tingling in the R and next day she had a lot of pain and stiffness on Left.   She called out from work and went to [facility] and Monday she had pain on the R side and she was seen yesterday--- now R > L shoulder pain  Review of Systems  Constitutional: Positive for fatigue. Negative for activity change, appetite change, diaphoresis and unexpected weight change.  Eyes: Negative for pain, redness and visual disturbance.  Respiratory: Negative for cough, chest tightness, shortness of breath and wheezing.   Cardiovascular: Negative for chest pain, palpitations and leg swelling.  Endocrine: Negative for cold intolerance, heat intolerance, polydipsia, polyphagia and polyuria.  Genitourinary: Negative for difficulty urinating, dysuria and frequency.  Neurological: Negative for dizziness, light-headedness, numbness and headaches.  Psychiatric/Behavioral: Negative for behavioral problems and dysphoric mood. The patient is not nervous/anxious.     History Past Medical History:  Diagnosis Date  . Blockage of coronary artery of heart ([place])   . Depression   . Diabetes mellitus   . Hyperlipidemia   . Hypertension   . Pancreatitis   . Restless leg syndrome     She has a past surgical history that includes Tubal ligation; Rotator cuff repair; Coronary angioplasty with stent; and Foot surgery (Right).   Her family history includes Breast cancer in her sister; Cancer in her other; Heart attack in her father and sister; Heart attack (age of onset: 35) in her brother.She reports that she has been smoking.  She has been smoking  about 0.00 packs per day. She has never used smokeless tobacco. She reports that she does not drink alcohol or use drugs.  Current Outpatient Prescriptions on File Prior to Visit  Medication Sig Dispense Refill  . alendronate (FOSAMAX) 70 MG tablet take 1 tablet by mouth every week on an empty stomach with 6-8 oz of water. No food / meds for 30 min. Remain Upright (Patient taking differently: take 1 tablet by mouth every Sunday on an empty stomach with 6-8 oz of water. No food / meds for 30 min. Remain Upright) 4 tablet 11  . ALPRAZolam (XANAX) 0.25 MG tablet take 1 tablet by mouth three times a day if needed 60 tablet 0  . amLODipine (NORVASC) 10 MG tablet take 1 tablet by mouth once daily 30 tablet 5  . aspirin EC 81 MG tablet Take 81 mg by mouth daily.    [name] atorvastatin (LIPITOR) 40 MG tablet take 1 tablet by mouth once daily (Patient taking differently: take 1 tablet by mouth once daily at bedtime) 30 tablet 5  . Blood Glucose Calibration (TAI DOC CONTROL) NORMAL SOLN     . Blood Glucose Monitoring Suppl (ACCU-CHEK AVIVA PLUS) w/Device KIT Check blood sugar daily. E11.9 1 kit 0  . Carboxymethylcellulose Sodium (THERATEARS OP) Place 1 drop into both eyes daily as needed (dry eyes).    . CINNAMON PO Take 1 tablet by mouth daily.     . Cyanocobalamin (VITAMIN B-12 PO) Take 1 tablet by mouth daily.    . cyclobenzaprine (  FLEXERIL) 10 MG tablet Take 1 tablet (10 mg total) by mouth 2 (two) times daily as needed for muscle spasms. 20 tablet 0  . diclofenac (CATAFLAM) 50 MG tablet Take 1 tablet (50 mg total) by mouth 2 (two) times daily. 14 tablet 0  . ferrous sulfate 325 (65 FE) MG tablet Take 325 mg by mouth daily with breakfast.    . Flaxseed, Linseed, (FLAXSEED OIL PO) Take 1 capsule by mouth 2 (two) times daily.     [name] FLUoxetine (PROZAC) 20 MG capsule take 1 capsule by mouth once daily 30 capsule 5  . fluticasone (FLONASE) 50 MCG/ACT nasal spray Place 2 sprays into both nostrils daily. 16 g 6  .  gabapentin (NEURONTIN) 100 MG capsule Take 1 capsule (100 mg total) by mouth 2 (two) times daily.  0  . GINKGO BILOBA COMPLEX PO Take 1 tablet by mouth daily.     [name] glucose blood (ACCU-CHEK AVIVA) test strip Check blood sugar four times daily. E11.9 400 each 3  . HYDROcodone-acetaminophen (NORCO) 5-325 MG tablet Take 1 tablet by mouth every 6 (six) hours as needed for moderate pain. 30 tablet 0  . ibuprofen (ADVIL,MOTRIN) 200 MG tablet Take 400 mg by mouth every 6 (six) hours as needed (pain).    . Insulin Pen Needle (PEN NEEDLES 31GX5/16") 31G X 8 MM MISC by Does not apply route daily.      . insulin regular (NOVOLIN R RELION) 100 units/mL injection Inject 0.3 mLs (30 Units total) into the skin daily as needed for high blood sugar (at noon if CBG >200 and will be eating). 10 mL   . Insulin Syringe-Needle U-100 (BD INSULIN SYRINGE ULTRAFINE) 31G X 5/16" 0.3 ML MISC 10 Units by Does not apply route 3 (three) times daily before meals.      Elmore Guise Devices (LANCING DEVICE) MISC     . levocetirizine (XYZAL) 5 MG tablet Take 1 tablet (5 mg total) by mouth every evening. 30 tablet 5  . lisinopril (PRINIVIL,ZESTRIL) 10 MG tablet Take 1 tablet (10 mg total) by mouth daily. 90 tablet 3  . metoprolol succinate (TOPROL-XL) 50 MG 24 hr tablet take 1 tablet by mouth once daily with food 90 tablet 1  . Multiple Vitamin (MULTIVITAMIN WITH MINERALS) TABS Take 1 tablet by mouth daily.    [name] NOVOLIN N RELION 100 UNIT/ML injection Inject 30-60 Units into the skin 2 (two) times daily. Inject 30 units subcutaneously before breakfast and 60 units at bedtime    . ondansetron (ZOFRAN) 4 MG tablet Take 1 tablet (4 mg total) by mouth every 6 (six) hours. 12 tablet 0  . PHARMACIST CHOICE LANCETS MISC     . vitamin E 400 UNIT capsule Take 400 Units by mouth daily.     No current facility-administered medications on file prior to visit.      Objective:  Objective  Physical Exam  Constitutional: She is oriented to person,  place, and time. She appears well-developed and well-nourished.  HENT:  Head: Normocephalic and atraumatic.  Eyes: Conjunctivae and EOM are normal.  Neck: Normal range of motion. Neck supple. No JVD present. Carotid bruit is not present. No thyromegaly present.  Cardiovascular: Normal rate, regular rhythm and normal heart sounds.   No murmur heard. Pulmonary/Chest: Effort normal and breath sounds normal. No respiratory distress. She has no wheezes. She has no rales. She exhibits no tenderness.  Musculoskeletal: Normal range of motion. She exhibits no edema.  Arms: Neurological: She is alert and oriented to person, place, and time.  Psychiatric: She has a normal mood and affect.   BP 122/80 (BP Location: Left Arm, Patient Position: Sitting, Cuff Size: Normal)   Temp 98.3 F (36.8 C) (Oral)   Resp 16   Ht '5\' 5"'  (1.651 m)   Wt 172 lb 9.6 oz (78.3 kg)   BMI 28.72 kg/m  Wt Readings from Last 3 Encounters:  06/15/16 172 lb 9.6 oz (78.3 kg)  05/29/16 175 lb (79.4 kg)  03/01/16 171 lb 3.2 oz (77.7 kg)     Lab Results  Component Value Date   WBC 7.6 02/19/2016   HGB 11.8 (L) 02/19/2016   HCT 35.6 (L) 02/19/2016   PLT 155 02/19/2016   GLUCOSE 226 (H) 02/18/2016   CHOL 128 02/18/2016   TRIG 61 02/18/2016   HDL 36 (L) 02/18/2016   LDLDIRECT 59.0 11/14/2015   LDLCALC 80 02/18/2016   ALT 24 02/18/2016   AST 19 02/18/2016   NA 136 02/18/2016   K 3.7 02/18/2016   CL 102 02/18/2016   CREATININE 0.68 02/18/2016   BUN 14 02/18/2016   CO2 26 02/18/2016   TSH 0.68 06/02/2015   INR 1.1 03/03/2009   HGBA1C 11.1 (H) 11/14/2015   MICROALBUR 1.7 06/23/2015    Dg Tibia/fibula Left  Result Date: 05/29/2016 CLINICAL DATA:  Left lower leg pain for 2 weeks EXAM: LEFT TIBIA AND FIBULA - 2 VIEW COMPARISON:  None. FINDINGS: The left tibia and fibula are intact and normally aligned. What is seen of the left knee is unremarkable. The left ankle joint also is unremarkable. There are  degenerative calcaneal spurs noted. IMPRESSION: No acute abnormality.  Degenerative calcaneal spurs. Electronically Signed   By: Ivar Drape M.D.   On: 05/29/2016 11:32   US Venous Img Lower Unilateral Left  Result Date: 05/29/2016 CLINICAL DATA:  Left lower  extremity pain and edema. EXAM: LEFT LOWER EXTREMITY VENOUS DOPPLER ULTRASOUND TECHNIQUE: Gray-scale sonography with graded compression, as well as color Doppler and duplex ultrasound were performed to evaluate the lower extremity deep venous systems from the level of the common femoral vein and including the common femoral, femoral, profunda femoral, popliteal and calf veins including the posterior tibial, peroneal and gastrocnemius veins when visible. The superficial great saphenous vein was also interrogated. Spectral Doppler was utilized to evaluate flow at rest and with distal augmentation maneuvers in the common femoral, femoral and popliteal veins. COMPARISON:  None. FINDINGS: Contralateral Common Femoral Vein: Respiratory phasicity is normal and symmetric with the symptomatic side. No evidence of thrombus. Normal compressibility. Common Femoral Vein: No evidence of thrombus. Saphenofemoral Junction: No evidence of thrombus. Profunda Femoral Vein: No evidence of thrombus. Femoral Vein: No evidence of thrombus. Popliteal Vein: No evidence of thrombus. Calf Veins: No evidence of thrombus. Superficial Great Saphenous Vein: No evidence of thrombus. Other Findings: Grayscale imaging along the symptomatic plantar left foot shows an ill-defined 2 mm area of hypo echogenicity. Finding is non worrisome with no typical features for foreign body. No drainable fluid collection. IMPRESSION: Negative for left lower extremity DVT. Electronically Signed   By: Monte Fantasia M.D.   On: 05/29/2016 19:01   Dg Foot Complete Left  Result Date: 05/29/2016 CLINICAL DATA:  Left foot pain, knot in the arch of the foot EXAM: LEFT FOOT - COMPLETE 3+ VIEW COMPARISON:  Left  foot films of 01/30/2013 FINDINGS: A screw remains in the distal left first metatarsal after prior osteotomy. Degenerative spurring  is noted from the distal first metatarsal. There is mild degenerative joint disease of the left first MTP joint. No fracture is seen. No periosteal reaction is noted. Degenerative calcaneal spurs are present. No soft tissue abnormality is seen on the lateral view. IMPRESSION: 1. Prior distal left first metatarsal osteotomy with degenerative change. 2. Mild degenerative change of the left first MTP joint with degenerative calcaneal spurs present as well. Electronically Signed   By: Ivar Drape M.D.   On: 05/29/2016 11:35     Assessment & Plan:  Plan  I have discontinued Ms. Alemu's omega-3 acid ethyl esters and meclizine. I am also having her start on tiZANidine and meloxicam. Additionally, I am having her maintain her Insulin Syringe-Needle U-100, PEN NEEDLES 31GX5/16", NOVOLIN N RELION, multivitamin with minerals, Lancing Device, TAI DOC CONTROL, aspirin EC, PHARMACIST CHOICE LANCETS, glucose blood, (Flaxseed, Linseed, (FLAXSEED OIL PO)), ondansetron, alendronate, CINNAMON PO, GINKGO BILOBA COMPLEX PO, FLUoxetine, ALPRAZolam, levocetirizine, fluticasone, ACCU-CHEK AVIVA PLUS, atorvastatin, ferrous sulfate, Cyanocobalamin (VITAMIN B-12 PO), vitamin E, Carboxymethylcellulose Sodium (THERATEARS OP), ibuprofen, insulin regular, lisinopril, HYDROcodone-acetaminophen, gabapentin, amLODipine, cyclobenzaprine, diclofenac, and metoprolol succinate.  Meds ordered this encounter  Medications  . tiZANidine (ZANAFLEX) 4 MG tablet    Sig: Take 1 tablet (4 mg total) by mouth every 6 (six) hours as needed for muscle spasms.    Dispense:  30 tablet    Refill:  0  . meloxicam (MOBIC) 15 MG tablet    Sig: 1/2 - 1 po qd prn    Dispense:  30 tablet    Refill:  0    Problem List Items Addressed This Visit    None    Visit Diagnoses    Muscle strain of scapular region, left, initial  encounter    -  Primary   Relevant Medications   tiZANidine (ZANAFLEX) 4 MG tablet   meloxicam (MOBIC) 15 MG tablet   Other Relevant Orders   Ambulatory referral to Orthopedic Surgery    pt seen by [facility] and referred to pt but pt is requesting ortho referral   Follow-up: No Follow-up on file.  Ann Held, DO

## 2016-06-15 NOTE — Patient Instructions (Signed)

## 2016-06-19 NOTE — ED Provider Notes (Signed)
CSN: [id number]     Arrival date & time 05/31/16  4239 History   None    No chief complaint on file.  (Consider location/radiation/quality/duration/timing/severity/associated sxs/prior Treatment) HPI I do not recall seeing this patient.  Past Medical History:  Diagnosis Date  . Blockage of coronary artery of heart ([place])   . Depression   . Diabetes mellitus   . Hyperlipidemia   . Hypertension   . Pancreatitis   . Restless leg syndrome    Past Surgical History:  Procedure Laterality Date  . CORONARY ANGIOPLASTY WITH STENT PLACEMENT    . FOOT SURGERY Right    cyst removal  . ROTATOR CUFF REPAIR     right  . TUBAL LIGATION     Family History  Problem Relation Age of Onset  . Heart attack Father   . Lung cancer    . Thyroid cancer    . Uterine cancer    . Breast cancer Sister   . Cancer Other     lung,thyroid,breast,uterine  . Heart attack Sister   . Heart attack Brother 86   Social History  Substance Use Topics  . Smoking status: Current Some Day Smoker    Packs/day: 0.00  . Smokeless tobacco: Never Used  . Alcohol use No   OB History    No data available     Review of Systems Pt not seen by me Allergies  Levemir [insulin detemir] and Metformin and related  Home Medications   Prior to Admission medications   Medication Sig Start Date End Date Taking? Authorizing Provider  alendronate (FOSAMAX) 70 MG tablet take 1 tablet by mouth every week on an empty stomach with 6-8 oz of water. No food / meds for 30 min. Remain Upright Patient taking differently: take 1 tablet by mouth every Sunday on an empty stomach with 6-8 oz of water. No food / meds for 30 min. Remain Upright 07/04/15   Rosalita Chessman Chase, DO  ALPRAZolam Duanne Moron) 0.25 MG tablet take 1 tablet by mouth three times a day if needed 09/23/15   Rosalita Chessman Chase, DO  amLODipine (NORVASC) 10 MG tablet take 1 tablet by mouth once daily 04/09/16   Rosalita Chessman Chase, DO  aspirin EC 81 MG tablet Take 81 mg  by mouth daily.    Historical Provider, MD  atorvastatin (LIPITOR) 40 MG tablet take 1 tablet by mouth once daily Patient taking differently: take 1 tablet by mouth once daily at bedtime 01/17/16   Rosalita Chessman Chase, DO  Blood Glucose Calibration ([facility]I [facility] CONTROL) NORMAL SOLN  10/19/12   Historical Provider, MD  Blood Glucose Monitoring Suppl (ACCU-CHEK AVIVA PLUS) w/Device KIT Check blood sugar daily. E11.9 11/25/15   Alferd Apa Lowne Chase, DO  Carboxymethylcellulose Sodium (THERATEARS OP) Place 1 drop into both eyes daily as needed (dry eyes).    Historical Provider, MD  CINNAMON PO Take 1 tablet by mouth daily.     Historical Provider, MD  Cyanocobalamin (VITAMIN B-12 PO) Take 1 tablet by mouth daily.    Historical Provider, MD  cyclobenzaprine (FLEXERIL) 10 MG tablet Take 1 tablet (10 mg total) by mouth 2 (two) times daily as needed for muscle spasms. 05/27/16   Konrad Felix, PA  diclofenac (CATAFLAM) 50 MG tablet Take 1 tablet (50 mg total) by mouth 2 (two) times daily. 05/29/16   Percell Miller Saguier, PA-C  ferrous sulfate 325 (65 FE) MG tablet Take 325 mg by mouth daily with breakfast.  Historical Provider, MD  Flaxseed, Linseed, (FLAXSEED OIL PO) Take 1 capsule by mouth 2 (two) times daily.     Historical Provider, MD  FLUoxetine (PROZAC) 20 MG capsule take 1 capsule by mouth once daily 09/22/15   Alferd Apa Lowne Chase, DO  fluticasone [facility]) 50 MCG/ACT nasal spray Place 2 sprays into both nostrils daily. 11/14/15   Rosalita Chessman Chase, DO  gabapentin (NEURONTIN) 100 MG capsule Take 1 capsule (100 mg total) by mouth 2 (two) times daily. 03/01/16   Alferd Apa Lowne Chase, DO  GINKGO BILOBA COMPLEX PO Take 1 tablet by mouth daily.     Historical Provider, MD  glucose blood (ACCU-CHEK AVIVA) test strip Check blood sugar four times daily. E11.9 12/14/14   Rosalita Chessman Chase, DO  HYDROcodone-acetaminophen (NORCO) 5-325 MG tablet Take 1 tablet by mouth every 6 (six) hours as needed for moderate pain.  03/01/16   Rosalita Chessman Chase, DO  ibuprofen (ADVIL,MOTRIN) 200 MG tablet Take 400 mg by mouth every 6 (six) hours as needed (pain).    Historical Provider, MD  Insulin Pen Needle (PEN NEEDLES 31GX5/16") 31G X 8 MM MISC by Does not apply route daily.      Historical Provider, MD  insulin regular (NOVOLIN R RELION) 100 units/mL injection Inject 0.3 mLs (30 Units total) into the skin daily as needed for high blood sugar (at noon if CBG >200 and will be eating). 03/01/16   Rosalita Chessman Chase, DO  Insulin Syringe-Needle U-100 (BD INSULIN SYRINGE ULTRAFINE) 31G X 5/16" 0.3 ML MISC 10 Units by Does not apply route 3 (three) times daily before meals.      Historical Provider, MD  Lancet Devices (LANCING DEVICE) MISC  10/19/12   Historical Provider, MD  levocetirizine (XYZAL) 5 MG tablet Take 1 tablet (5 mg total) by mouth every evening. 11/14/15   Rosalita Chessman Chase, DO  lisinopril (PRINIVIL,ZESTRIL) 10 MG tablet Take 1 tablet (10 mg total) by mouth daily. 03/01/16   Rosalita Chessman Chase, DO  meloxicam (MOBIC) 15 MG tablet 1/2 - 1 po qd prn 06/15/16   Rosalita Chessman Chase, DO  metoprolol succinate (TOPROL-XL) 50 MG 24 hr tablet take 1 tablet by mouth once daily with food 06/08/16   Ann Held, DO  Multiple Vitamin (MULTIVITAMIN WITH MINERALS) TABS Take 1 tablet by mouth daily.    Historical Provider, MD  NOVOLIN N RELION 100 UNIT/ML injection Inject 30-60 Units into the skin 2 (two) times daily. Inject 30 units subcutaneously before breakfast and 60 units at bedtime 05/05/12   Historical Provider, MD  ondansetron (ZOFRAN) 4 MG tablet Take 1 tablet (4 mg total) by mouth every 6 (six) hours. 12/31/14   Ottie Glazier, PA-C  PHARMACIST CHOICE LANCETS MISC  10/31/13   Historical Provider, MD  tiZANidine (ZANAFLEX) 4 MG tablet Take 1 tablet (4 mg total) by mouth every 6 (six) hours as needed for muscle spasms. 06/15/16   Rosalita Chessman Chase, DO  vitamin E 400 UNIT capsule Take 400 Units by mouth daily.     Historical Provider, MD   Meds Ordered and Administered this Visit  Medications - No data to display  There were no vitals taken for this visit. No data found.   Physical Exam Pt not seen by me Urgent Care Course   Clinical Course    Procedures (including critical care time)  Labs Review Labs Reviewed - No data to display  Imaging Review No  results found.   Visual Acuity Review  Right Eye Distance:   Left Eye Distance:   Bilateral Distance:    Right Eye Near:   Left Eye Near:    Bilateral Near:         MDM  No diagnosis found.          Konrad Felix, [place] 06/20/16 1635

## 2016-06-20 ENCOUNTER — Ambulatory Visit: Payer: Commercial Managed Care - HMO | Admitting: Podiatry

## 2016-07-02 ENCOUNTER — Ambulatory Visit (INDEPENDENT_AMBULATORY_CARE_PROVIDER_SITE_OTHER): Payer: Commercial Managed Care - HMO

## 2016-07-02 ENCOUNTER — Ambulatory Visit (INDEPENDENT_AMBULATORY_CARE_PROVIDER_SITE_OTHER): Payer: Commercial Managed Care - HMO | Admitting: Podiatry

## 2016-07-02 ENCOUNTER — Encounter: Payer: Self-pay | Admitting: Podiatry

## 2016-07-02 VITALS — BP 133/66 | HR 62 | Resp 16 | Ht 65.0 in | Wt 172.0 lb

## 2016-07-02 DIAGNOSIS — M79672 Pain in left foot: Secondary | ICD-10-CM

## 2016-07-02 DIAGNOSIS — M21619 Bunion of unspecified foot: Secondary | ICD-10-CM

## 2016-07-02 DIAGNOSIS — M722 Plantar fascial fibromatosis: Secondary | ICD-10-CM | POA: Diagnosis not present

## 2016-07-02 MED ORDER — TRIAMCINOLONE ACETONIDE 10 MG/ML IJ SUSP
10.0000 mg | Freq: Once | INTRAMUSCULAR | Status: AC
  Administered 2016-07-02: 10 mg

## 2016-07-02 NOTE — Progress Notes (Signed)
   Subjective:    Patient ID: [name], female    DOB: 02/09/1946, 70 y.o.   MRN: [id number]  HPI Chief Complaint  Patient presents with  . Foot Pain    L foot painful lump / plantar midfoot x 6 wks.  Pt states "it's gone down in size."       Review of Systems  All other systems reviewed and are negative.      Objective:   Physical Exam        Assessment & Plan:

## 2016-07-04 NOTE — Progress Notes (Addendum)
Subjective:     Patient ID: [name], female   DOB: 06/06/1946, 70 y.o.   MRN: [id number]  HPI patient presents stating she's developed a nodule in the mid arch area left that's been painful and seems to have gone down somewhat but is still sore and has been present for several months   Review of Systems  All other systems reviewed and are negative.      Objective:   Physical Exam  Constitutional: She is oriented to person, place, and time.  Cardiovascular: Intact distal pulses.   Musculoskeletal: Normal range of motion.  Neurological: She is oriented to person, place, and time.  Skin: Skin is warm.  Nursing note and vitals reviewed.  neurovascular status intact muscle strength adequate range of motion within normal limits with patient noted to have small nodule in the mid arch area left that is painful when pressed and measures about 5 x 5 mm and appears to be within fascial tissue. Found have good digital perfusion and well oriented 3     Assessment:     Inflammatory fasciitis with possibility for small fibroma nodule or cyst formation plantar left    Plan:     H&P x-rays reviewed and today injected the small area 3 Milligan Kenalog 5 mg Xylocaine to shrink and advised on heat therapy stretch therapy and reappoint to recheck  X-ray report was negative for signs of nodule or calcification and mild depression of arch is noted with no spurring

## 2016-07-04 NOTE — Progress Notes (Signed)
This encounter was created in error - please disregard.

## 2016-07-12 ENCOUNTER — Other Ambulatory Visit: Payer: Self-pay | Admitting: Occupational Medicine

## 2016-07-12 ENCOUNTER — Ambulatory Visit: Payer: Self-pay

## 2016-07-12 DIAGNOSIS — M25511 Pain in right shoulder: Secondary | ICD-10-CM

## 2016-08-01 ENCOUNTER — Emergency Department (HOSPITAL_BASED_OUTPATIENT_CLINIC_OR_DEPARTMENT_OTHER)
Admission: EM | Admit: 2016-08-01 | Discharge: 2016-08-01 | Disposition: A | Discharge status: Home / Self Care | Payer: Commercial Managed Care - HMO | Attending: Emergency Medicine | Admitting: Emergency Medicine

## 2016-08-01 ENCOUNTER — Encounter (HOSPITAL_BASED_OUTPATIENT_CLINIC_OR_DEPARTMENT_OTHER): Payer: Self-pay | Admitting: Emergency Medicine

## 2016-08-01 DIAGNOSIS — Z794 Long term (current) use of insulin: Secondary | ICD-10-CM | POA: Diagnosis not present

## 2016-08-01 DIAGNOSIS — Z79899 Other long term (current) drug therapy: Secondary | ICD-10-CM | POA: Insufficient documentation

## 2016-08-01 DIAGNOSIS — Z7982 Long term (current) use of aspirin: Secondary | ICD-10-CM | POA: Diagnosis not present

## 2016-08-01 DIAGNOSIS — F17210 Nicotine dependence, cigarettes, uncomplicated: Secondary | ICD-10-CM | POA: Diagnosis not present

## 2016-08-01 DIAGNOSIS — I2510 Atherosclerotic heart disease of native coronary artery without angina pectoris: Secondary | ICD-10-CM | POA: Insufficient documentation

## 2016-08-01 DIAGNOSIS — E119 Type 2 diabetes mellitus without complications: Secondary | ICD-10-CM | POA: Diagnosis not present

## 2016-08-01 DIAGNOSIS — Z791 Long term (current) use of non-steroidal anti-inflammatories (NSAID): Secondary | ICD-10-CM | POA: Diagnosis not present

## 2016-08-01 DIAGNOSIS — I10 Essential (primary) hypertension: Secondary | ICD-10-CM | POA: Insufficient documentation

## 2016-08-01 DIAGNOSIS — L299 Pruritus, unspecified: Secondary | ICD-10-CM | POA: Diagnosis not present

## 2016-08-01 LAB — CBG MONITORING, ED: GLUCOSE-CAPILLARY: 141 mg/dL — AB (ref 65–99)

## 2016-08-01 MED ORDER — HYDROXYZINE HCL 10 MG PO TABS: 10.0000 mg | ORAL_TABLET | Freq: Three times a day (TID) | ORAL | 0 refills | Status: DC | PRN

## 2016-08-01 NOTE — ED Triage Notes (Signed)
Patient c/o itching that started over the weekend. Initially was localized and has progressed to generalized itching. Patient has used Cortizone with some relief. Patient states right now it is not as bad as it has been. Patient denies rash. Patient denies this happening before.

## 2016-08-01 NOTE — ED Provider Notes (Signed)
[place] [facility] Provider Note   CSN: [id number] Arrival date & time: 08/01/16  1711     History   Chief Complaint Chief Complaint  Patient presents with  . Pruritis    HPI [name] is a 70 y.o. female.  HPI Patient presents with 3 days of itching. States started in her left shoulder and then right foot. She now complains of itching to bilateral hands and forearms. Recently started new detergent. No new rash. No new medication. No other family members with similar itching Past Medical History:  Diagnosis Date  . Blockage of coronary artery of heart ([place])   . Depression   . Diabetes mellitus   . Hyperlipidemia   . Hypertension   . Pancreatitis   . Restless leg syndrome     Patient Active Problem List   Diagnosis Date Noted  . Dyspnea 02/18/2016  . Chest pain 02/18/2016  . HLD (hyperlipidemia) 02/18/2016  . CAD in native artery 02/18/2016  . RLS (restless legs syndrome) 02/18/2016  . Pancreatitis 02/18/2016  . SOB (shortness of breath) 02/18/2016  . Uncontrolled type 2 diabetes mellitus with complication ([place])   . Tobacco abuse   . Osteoarthritis of right knee 07/05/2015  . Smoking 07/02/2013  . Acute upper respiratory infections of unspecified site 07/02/2013  . Flu vaccine need 07/02/2013  . Multinodular goiter 04/16/2013  . Bunion 07/07/2012  . BACK PAIN, THORACIC REGION, RIGHT 05/24/2009  . IRON DEFICIENCY 03/25/2009  . GERD 03/25/2009  . WEIGHT LOSS 03/25/2009  . SINUSITIS- ACUTE-NOS 11/25/2008  . HYPERLIPIDEMIA 10/07/2008  . ACUTE PANCREATITIS 09/07/2008  . OVARIAN CYST 09/07/2008  . KNEE PAIN, RIGHT 05/24/2008  . CARPAL TUNNEL SYNDROME, RIGHT 05/12/2008  . ROTATOR CUFF REPAIR, RIGHT, HX OF 03/29/2008  . GOITER, MULTINODULAR 12/26/2007  . Depression 07/07/2007  . HIP PAIN, RIGHT 03/11/2007  . RESTLESS LEG SYNDROME, HX OF 03/11/2007  . Diabetes mellitus type II, uncontrolled ([place]) 03/07/2007  . Essential hypertension 03/07/2007     Past Surgical History:  Procedure Laterality Date  . CORONARY ANGIOPLASTY WITH STENT PLACEMENT    . FOOT SURGERY Right    cyst removal  . ROTATOR CUFF REPAIR     right  . TUBAL LIGATION      OB History    No data available       Home Medications    Prior to Admission medications   Medication Sig Start Date End Date Taking? Authorizing Provider  alendronate (FOSAMAX) 70 MG tablet take 1 tablet by mouth every week on an empty stomach with 6-8 oz of water. No food / meds for 30 min. Remain Upright Patient taking differently: take 1 tablet by mouth every Sunday on an empty stomach with 6-8 oz of water. No food / meds for 30 min. Remain Upright 07/04/15  Yes Alferd Apa Lowne Chase, DO  amLODipine (NORVASC) 10 MG tablet take 1 tablet by mouth once daily 04/09/16  Yes [name], DO  aspirin EC 81 MG tablet Take 81 mg by mouth daily.   Yes Historical Provider, MD  atorvastatin (LIPITOR) 40 MG tablet take 1 tablet by mouth once daily Patient taking differently: take 1 tablet by mouth once daily at bedtime 01/17/16  Yes [name], DO  Carboxymethylcellulose Sodium (THERATEARS OP) Place 1 drop into both eyes daily as needed (dry eyes).   Yes Historical Provider, MD  Cyanocobalamin (VITAMIN B-12 PO) Take 1 tablet by mouth daily.   Yes Historical Provider, MD  cyclobenzaprine (FLEXERIL) 10 MG tablet Take 1 tablet (10 mg total) by mouth 2 (two) times daily as needed for muscle spasms. 05/27/16  Yes Konrad Felix, PA  diclofenac (CATAFLAM) 50 MG tablet Take 1 tablet (50 mg total) by mouth 2 (two) times daily. 05/29/16  Yes Edward Saguier, PA-C  ferrous sulfate 325 (65 FE) MG tablet Take 325 mg by mouth daily with breakfast.   Yes Historical Provider, MD  Flaxseed, Linseed, (FLAXSEED OIL PO) Take 1 capsule by mouth 2 (two) times daily.    Yes Historical Provider, MD  FLUoxetine (PROZAC) 20 MG capsule take 1 capsule by mouth once daily 09/22/15  Yes [name], DO   gabapentin (NEURONTIN) 100 MG capsule Take 1 capsule (100 mg total) by mouth 2 (two) times daily. 03/01/16  Yes [name], DO  GINKGO BILOBA COMPLEX PO Take 1 tablet by mouth daily.    Yes Historical Provider, MD  HYDROcodone-acetaminophen (NORCO) 5-325 MG tablet Take 1 tablet by mouth every 6 (six) hours as needed for moderate pain. 03/01/16  Yes [name], DO  ibuprofen (ADVIL,MOTRIN) 200 MG tablet Take 400 mg by mouth every 6 (six) hours as needed (pain).   Yes Historical Provider, MD  insulin regular (NOVOLIN R RELION) 100 units/mL injection Inject 0.3 mLs (30 Units total) into the skin daily as needed for high blood sugar (at noon if CBG >200 and will be eating). 03/01/16  Yes [name], DO  levocetirizine (XYZAL) 5 MG tablet Take 1 tablet (5 mg total) by mouth every evening. 11/14/15  Yes [name], DO  lisinopril (PRINIVIL,ZESTRIL) 10 MG tablet Take 1 tablet (10 mg total) by mouth daily. 03/01/16  Yes Alferd Apa Lowne Chase, DO  metoprolol succinate (TOPROL-XL) 50 MG 24 hr tablet take 1 tablet by mouth once daily with food 06/08/16  Yes [name], DO  Multiple Vitamin (MULTIVITAMIN WITH MINERALS) TABS Take 1 tablet by mouth daily.   Yes Historical Provider, MD  NOVOLIN N RELION 100 UNIT/ML injection Inject 30-60 Units into the skin 2 (two) times daily. Inject 30 units subcutaneously before breakfast and 60 units at bedtime 05/05/12  Yes Historical Provider, MD  tiZANidine (ZANAFLEX) 4 MG tablet Take 1 tablet (4 mg total) by mouth every 6 (six) hours as needed for muscle spasms. 06/15/16  Yes [name], DO  vitamin E 400 UNIT capsule Take 400 Units by mouth daily.   Yes Historical Provider, MD  ALPRAZolam Duanne Moron) 0.25 MG tablet take 1 tablet by mouth three times a day if needed 09/23/15   Rosalita Chessman Chase, DO  Blood Glucose Calibration ([facility] CONTROL) NORMAL SOLN  10/19/12   Historical Provider, MD  Blood Glucose Monitoring Suppl  (ACCU-CHEK AVIVA PLUS) w/Device KIT Check blood sugar daily. E11.9 11/25/15   Alferd Apa Lowne Chase, DO  CINNAMON PO Take 1 tablet by mouth daily.     Historical Provider, MD  fluticasone (FLONASE) 50 MCG/ACT nasal spray Place 2 sprays into both nostrils daily. 11/14/15   Rosalita Chessman Chase, DO  glucose blood (ACCU-CHEK AVIVA) test strip Check blood sugar four times daily. E11.9 12/14/14   Rosalita Chessman Chase, DO  hydrOXYzine (ATARAX/VISTARIL) 10 MG tablet Take 1 tablet (10 mg total) by mouth every 8 (eight) hours as needed for itching. 08/01/16   Julianne Rice, MD  Insulin Pen Needle (PEN NEEDLES 31GX5/16") 31G X 8 MM MISC by Does not  apply route daily.      Historical Provider, MD  Insulin Syringe-Needle U-100 (BD INSULIN SYRINGE ULTRAFINE) 31G X 5/16" 0.3 ML MISC 10 Units by Does not apply route 3 (three) times daily before meals.      Historical Provider, MD  Lancet Devices (LANCING DEVICE) MISC  10/19/12   Historical Provider, MD  meloxicam (MOBIC) 15 MG tablet 1/2 - 1 po qd prn 06/15/16   Alferd Apa Lowne Chase, DO  ondansetron (ZOFRAN) 4 MG tablet Take 1 tablet (4 mg total) by mouth every 6 (six) hours. 12/31/14   Ottie Glazier, PA-C  PHARMACIST CHOICE LANCETS MISC  10/31/13   Historical Provider, MD    Family History Family History  Problem Relation Age of Onset  . Heart attack Father   . Lung cancer    . Thyroid cancer    . Uterine cancer    . Breast cancer Sister   . Cancer Other     lung,thyroid,breast,uterine  . Heart attack Sister   . Heart attack Brother 59    Social History Social History  Substance Use Topics  . Smoking status: Current Some Day Smoker    Packs/day: 0.20    Types: Cigarettes  . Smokeless tobacco: Never Used  . Alcohol use No     Allergies   Levemir [insulin detemir] and Metformin and related   Review of Systems Review of Systems  Constitutional: Negative for chills, fatigue and fever.  HENT: Negative for facial swelling and trouble swallowing.    Respiratory: Negative for wheezing.   Gastrointestinal: Negative for nausea and vomiting.  Musculoskeletal: Negative for neck pain and neck stiffness.  Skin: Negative for color change, rash and wound.  All other systems reviewed and are negative.    Physical Exam Updated Vital Signs BP 140/65   Pulse (!) 53   Temp 97.7 F (36.5 C) (Oral)   Resp 18   Ht '5\' 5"'  (1.651 m)   Wt 170 lb (77.1 kg)   SpO2 100%   BMI 28.29 kg/m   Physical Exam  Constitutional: She is oriented to person, place, and time. She appears well-developed and well-nourished.  HENT:  Head: Normocephalic and atraumatic.  Mouth/Throat: Oropharynx is clear and moist. No oropharyngeal exudate.  Eyes: EOM are normal. Pupils are equal, round, and reactive to light.  Neck: Normal range of motion. Neck supple.  Cardiovascular: Normal rate and regular rhythm.   Pulmonary/Chest: Effort normal and breath sounds normal. No stridor.  Abdominal: Soft. Bowel sounds are normal.  Musculoskeletal: Normal range of motion. She exhibits no edema or tenderness.  Neurological: She is alert and oriented to person, place, and time.  Skin: Skin is warm and dry. No rash noted. No erythema.  Patient's skin is dry. No obvious rashes. Patient does have 1 small erythematous papule to the right foot. No other similar lesions.  Psychiatric: She has a normal mood and affect. Her behavior is normal.  Nursing note and vitals reviewed.    ED Treatments / Results  Labs (all labs ordered are listed, but only abnormal results are displayed) Labs Reviewed  CBG MONITORING, ED - Abnormal; Notable for the following:       Result Value   Glucose-Capillary 141 (*)    All other components within normal limits    EKG  EKG Interpretation None       Radiology No results found.  Procedures Procedures (including critical care time)  Medications Ordered in ED Medications - No data to display  Initial Impression / Assessment and Plan /  ED Course  I have reviewed the triage vital signs and the nursing notes.  Pertinent labs & imaging results that were available during my care of the patient were reviewed by me and considered in my medical decision making (see chart for details).  Clinical Course   Advised to use hypoallergenic soaps and detergents and follow-up with her primary physician. Return precautions given.   Final Clinical Impressions(s) / ED Diagnoses   Final diagnoses:  Pruritus    New Prescriptions New Prescriptions   HYDROXYZINE (ATARAX/VISTARIL) 10 MG TABLET    Take 1 tablet (10 mg total) by mouth every 8 (eight) hours as needed for itching.     Julianne Rice, MD 08/01/16 1806

## 2016-08-06 ENCOUNTER — Ambulatory Visit (INDEPENDENT_AMBULATORY_CARE_PROVIDER_SITE_OTHER): Payer: Worker's Compensation | Admitting: Orthopaedic Surgery

## 2016-08-07 ENCOUNTER — Ambulatory Visit (INDEPENDENT_AMBULATORY_CARE_PROVIDER_SITE_OTHER): Payer: Worker's Compensation | Admitting: Orthopaedic Surgery

## 2016-08-07 ENCOUNTER — Encounter (INDEPENDENT_AMBULATORY_CARE_PROVIDER_SITE_OTHER): Payer: Self-pay | Admitting: Orthopaedic Surgery

## 2016-08-07 VITALS — Ht 65.0 in | Wt 172.0 lb

## 2016-08-07 DIAGNOSIS — M25511 Pain in right shoulder: Secondary | ICD-10-CM

## 2016-08-07 NOTE — Progress Notes (Signed)
Office Visit Note   Patient: [name]           Date of Birth: 06/07/1946           MRN: [id number] Visit Date: 08/07/2016              Requested by: Donato SchultzYvonne R Lowne Chase, DO [address] [place], [place] [id number] PCP: Donato SchultzYvonne R Lowne Chase, DO   Assessment & Plan: Visit Diagnoses:  1. Acute pain of right shoulder     Plan:  - subacromial injection performed today - hopefully this will be therapeutic and diagnostic - recheck in 2 weeks, if not better, needs neck xrays and possibly MRI - stop PT for now Total face to face encounter time was greater than 45 minutes and over half of this time was spent in counseling and/or coordination of care.  Follow-Up Instructions: Return for recheck right shoulder.   Orders:  Orders Placed This Encounter  Procedures  . Large Joint Injection/Arthrocentesis   No orders of the defined types were placed in this encounter.     Procedures: Large Joint Inj Date/Time: 08/07/2016 10:06 AM Performed by: Tarry KosXU, NAIPING M Authorized by: Tarry KosXU, NAIPING M   Consent Given by:  Patient Timeout: prior to procedure the correct patient, procedure, and site was verified   Indications:  Pain Location:  Shoulder Site:  R subacromial bursa Prep: patient was prepped and draped in usual sterile fashion   Needle Size:  22 G Approach:  Posterior Ultrasound Guidance: No   Fluoroscopic Guidance: No       Clinical Data: No additional findings.   Subjective: Chief Complaint  Patient presents with  . 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 Right Shoulder - Pain  . 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  Pain is mainly in right shoulder with occ radiation to hand.  Pain feels deep inside, worse at night and activity and lifting.  Pain is 4/10.  Left shoulder hurts some also.  Has had 10  session of PT without significant relief.  Endorses numbness in right thumb.  Denies f/c/n/v.    Review of Systems  Constitutional: Negative.   HENT: Negative.   Eyes: Negative.   Respiratory: Negative.   Cardiovascular: Negative.   Endocrine: Negative.   Musculoskeletal: Negative.   Neurological: Negative.   Hematological: Negative.   Psychiatric/Behavioral: Negative.   All other systems reviewed and are negative.    Objective: Vital Signs: Ht 5\' 5"  (1.651 m)   Wt 172 lb (78 kg)   BMI 28.62 kg/m   Physical Exam  Constitutional: She is oriented to person, place, and time. She appears well-developed and well-nourished.  HENT:  Head: Atraumatic.  Eyes: EOM are normal.  Neck: Neck supple.  Cardiovascular: Intact distal pulses.   Pulmonary/Chest: Effort normal.  Abdominal: Soft.  Neurological: She is alert and oriented to person, place, and time.  Skin: Skin is warm. Capillary refill takes less than 2 seconds.  Psychiatric: She has a normal mood and affect. Her behavior is normal. Judgment and thought content normal.  Nursing note and vitals reviewed.   Back Exam   Comments:  Negative spurling.  Normal cervical ROM   Right Shoulder Exam   Tenderness  The patient is experiencing no tenderness.  Range of Motion  The patient has normal right shoulder ROM.  Muscle Strength  Abduction: 5/5  External Rotation: 4/5  Supraspinatus: 4/5  Subscapularis: 5/5  Biceps: 5/5   Tests  Apprehension: negative Cross Arm: positive Drop Arm: negative Hawkin's test: positive Impingement: positive Sulcus: absent  Other  Erythema: absent Sensation: normal Pulse: present      Specialty Comments:  No specialty comments available.  Imaging: No results found.   PMFS History: Patient Active Problem List   Diagnosis Date Noted  . Dyspnea 02/18/2016  . Chest pain 02/18/2016  . HLD (hyperlipidemia) 02/18/2016  . CAD in native artery 02/18/2016  . RLS  (restless legs syndrome) 02/18/2016  . Pancreatitis 02/18/2016  . SOB (shortness of breath) 02/18/2016  . Uncontrolled type 2 diabetes mellitus with complication (HCC)   . Tobacco abuse   . Osteoarthritis of right knee 07/05/2015  . Smoking 07/02/2013  . Acute upper respiratory infections of unspecified site 07/02/2013  . Flu vaccine need 07/02/2013  . Multinodular goiter 04/16/2013  . Bunion 07/07/2012  . BACK PAIN, THORACIC REGION, RIGHT 05/24/2009  . IRON DEFICIENCY 03/25/2009  . GERD 03/25/2009  . WEIGHT LOSS 03/25/2009  . SINUSITIS- ACUTE-NOS 11/25/2008  . HYPERLIPIDEMIA 10/07/2008  . ACUTE PANCREATITIS 09/07/2008  . OVARIAN CYST 09/07/2008  . KNEE PAIN, RIGHT 05/24/2008  . CARPAL TUNNEL SYNDROME, RIGHT 05/12/2008  . Acute pain of right shoulder 04/02/2008  . ROTATOR CUFF REPAIR, RIGHT, HX OF 03/29/2008  . GOITER, MULTINODULAR 12/26/2007  . Depression 07/07/2007  . HIP PAIN, RIGHT 03/11/2007  . RESTLESS LEG SYNDROME, HX OF 03/11/2007  . Diabetes mellitus type II, uncontrolled (HCC) 03/07/2007  . Essential hypertension 03/07/2007   Past Medical History:  Diagnosis Date  . Blockage of coronary artery of heart (HCC)   . Depression   . Diabetes mellitus   . Hyperlipidemia   . Hypertension   . Pancreatitis   . Restless leg syndrome     Family History  Problem Relation Age of Onset  . Heart attack Father   . Lung cancer    . Thyroid cancer    . Uterine cancer    . Breast cancer Sister   . Cancer Other     lung,thyroid,breast,uterine  . Heart attack Sister   . Heart attack Brother [phone]    Past Surgical History:  Procedure Laterality Date  . CORONARY ANGIOPLASTY WITH STENT PLACEMENT    . FOOT SURGERY Right    cyst removal  . ROTATOR CUFF REPAIR     right  . TUBAL LIGATION     Social History   Occupational History  . Not on file.   Social History Main Topics  . Smoking status: Current Some Day Smoker    Packs/day: 0.20    Types: Cigarettes  . Smokeless  tobacco: Never Used  . Alcohol use No  . Drug use: No  . Sexual activity: Not on file

## 2016-08-07 NOTE — Patient Instructions (Signed)

## 2016-08-08 ENCOUNTER — Telehealth (INDEPENDENT_AMBULATORY_CARE_PROVIDER_SITE_OTHER): Payer: Self-pay | Admitting: Orthopaedic Surgery

## 2016-08-08 NOTE — Telephone Encounter (Signed)
No lifting with her affected extremity for now.

## 2016-08-08 NOTE — Telephone Encounter (Signed)
Elizabeth with [employer] called needing work status and office notes faxed to her. The fax number is [phone]   The number to contact her is [phone]

## 2016-08-08 NOTE — Telephone Encounter (Signed)
Please advise on work status note.

## 2016-08-09 NOTE — Telephone Encounter (Signed)
Letter faxed.

## 2016-08-20 ENCOUNTER — Other Ambulatory Visit: Payer: Self-pay | Admitting: Family Medicine

## 2016-08-20 ENCOUNTER — Telehealth: Payer: Self-pay | Admitting: Family Medicine

## 2016-08-20 DIAGNOSIS — IMO0002 Reserved for concepts with insufficient information to code with codable children: Secondary | ICD-10-CM

## 2016-08-20 DIAGNOSIS — Z794 Long term (current) use of insulin: Secondary | ICD-10-CM

## 2016-08-20 DIAGNOSIS — E118 Type 2 diabetes mellitus with unspecified complications: Secondary | ICD-10-CM

## 2016-08-20 DIAGNOSIS — E1165 Type 2 diabetes mellitus with hyperglycemia: Secondary | ICD-10-CM

## 2016-08-20 NOTE — Telephone Encounter (Signed)
Pt called in to request a referral to Dr. Talmage NapBalan her diabetes physician

## 2016-08-21 ENCOUNTER — Ambulatory Visit (INDEPENDENT_AMBULATORY_CARE_PROVIDER_SITE_OTHER): Payer: Worker's Compensation | Admitting: Orthopaedic Surgery

## 2016-08-21 ENCOUNTER — Encounter (INDEPENDENT_AMBULATORY_CARE_PROVIDER_SITE_OTHER): Payer: Self-pay | Admitting: Orthopaedic Surgery

## 2016-08-21 ENCOUNTER — Ambulatory Visit (INDEPENDENT_AMBULATORY_CARE_PROVIDER_SITE_OTHER): Payer: Worker's Compensation

## 2016-08-21 DIAGNOSIS — G8929 Other chronic pain: Secondary | ICD-10-CM | POA: Diagnosis not present

## 2016-08-21 DIAGNOSIS — M25511 Pain in right shoulder: Secondary | ICD-10-CM

## 2016-08-21 DIAGNOSIS — M5412 Radiculopathy, cervical region: Secondary | ICD-10-CM

## 2016-08-21 NOTE — Progress Notes (Signed)
Office Visit Note   Patient: Emily ManifoldShirley C Clavin             Date of Birth: 10/10/1945           MRN: [id number] Visit Date: 08/21/2016              Requested by: Donato SchultzYvonne R Lowne Chase, DO [address] [place], [place] [id number] PCP: Donato SchultzYvonne R Lowne Chase, DO   Assessment & Plan: Visit Diagnoses:  1. Chronic right shoulder pain   2. Cervical radiculopathy     Plan: Radiographic findings are consistent with multilevel degenerative disc disease and spondylosis and given minimal relief from shoulder injection will obtain MRI of cervical spine to fully evaluate her neck.  Follow-Up Instructions: Return in about 2 weeks (around 09/04/2016) for review c spine MRI.   Orders:  Orders Placed This Encounter  Procedures  . XR Cervical Spine 2 or 3 views  . MR Cervical Spine w/o contrast   No orders of the defined types were placed in this encounter.     Procedures: No procedures performed   Clinical Data: No additional findings.   Subjective: Chief Complaint  Patient presents with  . Right Shoulder - Follow-up, Pain    HPI Patient follows up today status post right shoulder injection on 1031. She states that she is still having the same pain that radiates down her arm and into the hand. The injection helped for about 2 days.  The pain comes and goes. She has stopped doing physical therapy due to lack of response. Review of Systems   Objective: Vital Signs: There were no vitals taken for this visit.  Physical Exam  Ortho Exam Exam of the cervical spine shows a negative Spurling's. Shoulder exam is stable. Specialty Comments:  No specialty comments available.  Imaging: Xr Cervical Spine 2 Or 3 Views  Result Date: 08/21/2016 Multilevel DDD of c spine and spondylosis    PMFS History: Patient Active Problem List   Diagnosis Date Noted  . Dyspnea 02/18/2016  . Chest pain 02/18/2016  . HLD (hyperlipidemia) 02/18/2016  . CAD in native artery  02/18/2016  . RLS (restless legs syndrome) 02/18/2016  . Pancreatitis 02/18/2016  . SOB (shortness of breath) 02/18/2016  . Uncontrolled type 2 diabetes mellitus with complication (HCC)   . Tobacco abuse   . Osteoarthritis of right knee 07/05/2015  . Smoking 07/02/2013  . Acute upper respiratory infections of unspecified site 07/02/2013  . Flu vaccine need 07/02/2013  . Multinodular goiter 04/16/2013  . Bunion 07/07/2012  . BACK PAIN, THORACIC REGION, RIGHT 05/24/2009  . IRON DEFICIENCY 03/25/2009  . GERD 03/25/2009  . WEIGHT LOSS 03/25/2009  . SINUSITIS- ACUTE-NOS 11/25/2008  . HYPERLIPIDEMIA 10/07/2008  . ACUTE PANCREATITIS 09/07/2008  . OVARIAN CYST 09/07/2008  . KNEE PAIN, RIGHT 05/24/2008  . CARPAL TUNNEL SYNDROME, RIGHT 05/12/2008  . Acute pain of right shoulder 04/02/2008  . ROTATOR CUFF REPAIR, RIGHT, HX OF 03/29/2008  . GOITER, MULTINODULAR 12/26/2007  . Depression 07/07/2007  . HIP PAIN, RIGHT 03/11/2007  . RESTLESS LEG SYNDROME, HX OF 03/11/2007  . Diabetes mellitus type II, uncontrolled (HCC) 03/07/2007  . Essential hypertension 03/07/2007   Past Medical History:  Diagnosis Date  . Blockage of coronary artery of heart (HCC)   . Depression   . Diabetes mellitus   . Hyperlipidemia   . Hypertension   . Pancreatitis   . Restless leg syndrome     Family History  Problem  Relation Age of Onset  . Heart attack Father   . Lung cancer    . Thyroid cancer    . Uterine cancer    . Breast cancer Sister   . Cancer Other     lung,thyroid,breast,uterine  . Heart attack Sister   . Heart attack Brother [phone]    Past Surgical History:  Procedure Laterality Date  . CORONARY ANGIOPLASTY WITH STENT PLACEMENT    . FOOT SURGERY Right    cyst removal  . ROTATOR CUFF REPAIR     right  . TUBAL LIGATION     Social History   Occupational History  . Not on file.   Social History Main Topics  . Smoking status: Current Some Day Smoker    Packs/day: 0.20    Types:  Cigarettes  . Smokeless tobacco: Never Used  . Alcohol use No  . Drug use: No  . Sexual activity: Not on file

## 2016-08-22 ENCOUNTER — Other Ambulatory Visit: Payer: Self-pay | Admitting: Family Medicine

## 2016-08-23 MED ORDER — ALPRAZOLAM 0.25 MG PO TABS: ORAL_TABLET | ORAL | 0 refills | Status: DC

## 2016-08-23 NOTE — Telephone Encounter (Signed)
Left message on pt's voicemail to give the office a call back, in regards to her request for an referral to Dr. Talmage NapBalan her Diabetes physician. LB

## 2016-08-23 NOTE — Telephone Encounter (Signed)
Rx printed and forwarded to PCP for review and signature.  CSC attached.   

## 2016-08-23 NOTE — Telephone Encounter (Signed)
Refill x1 

## 2016-08-23 NOTE — Telephone Encounter (Signed)
Pt states she needs the referral for insurance purposes. Referral ordered.  While on the phone.  Pt also requested a refill on her xanax.  States she typically does not take it very often, but says anxiety is slowly starting to come back.    Xanax Last filled: 09/23/15 Amt: 60,0 Last OV: 03/01/16 Pt needs an office visit and needs to sign CSC and provide a UDS.  Please advise regarding refill.

## 2016-08-23 NOTE — Telephone Encounter (Signed)
Done. Please advise regarding xanax refill.

## 2016-08-23 NOTE — Telephone Encounter (Signed)
Ok to put referral in 

## 2016-08-24 ENCOUNTER — Telehealth (INDEPENDENT_AMBULATORY_CARE_PROVIDER_SITE_OTHER): Payer: Self-pay | Admitting: Orthopaedic Surgery

## 2016-08-24 NOTE — Telephone Encounter (Signed)
Please advise 

## 2016-08-24 NOTE — Telephone Encounter (Signed)
Light duty

## 2016-08-24 NOTE — Telephone Encounter (Signed)
Rx and contract placed up front.

## 2016-08-29 ENCOUNTER — Telehealth (INDEPENDENT_AMBULATORY_CARE_PROVIDER_SITE_OTHER): Payer: Self-pay | Admitting: Orthopaedic Surgery

## 2016-08-29 NOTE — Telephone Encounter (Signed)
Faxed all notes to [phone]

## 2016-08-29 NOTE — Telephone Encounter (Signed)
Left message on machine. Light duty for pt

## 2016-09-02 ENCOUNTER — Inpatient Hospital Stay: Admission: RE | Admit: 2016-09-02 | Payer: Self-pay | Source: Ambulatory Visit

## 2016-09-04 ENCOUNTER — Ambulatory Visit (INDEPENDENT_AMBULATORY_CARE_PROVIDER_SITE_OTHER): Payer: Self-pay | Admitting: Orthopaedic Surgery

## 2016-09-06 ENCOUNTER — Other Ambulatory Visit: Payer: Self-pay | Admitting: Family Medicine

## 2016-09-06 ENCOUNTER — Ambulatory Visit (INDEPENDENT_AMBULATORY_CARE_PROVIDER_SITE_OTHER): Payer: Worker's Compensation | Admitting: Orthopaedic Surgery

## 2016-09-06 ENCOUNTER — Encounter (INDEPENDENT_AMBULATORY_CARE_PROVIDER_SITE_OTHER): Payer: Self-pay | Admitting: Orthopaedic Surgery

## 2016-09-06 ENCOUNTER — Encounter (INDEPENDENT_AMBULATORY_CARE_PROVIDER_SITE_OTHER): Payer: Self-pay

## 2016-09-06 DIAGNOSIS — M5412 Radiculopathy, cervical region: Secondary | ICD-10-CM | POA: Insufficient documentation

## 2016-09-06 NOTE — Addendum Note (Signed)
Addended by: Albertina ParrGARCIA, Pallie Swigert on: 09/06/2016 09:28 AM   Modules accepted: Orders

## 2016-09-06 NOTE — Progress Notes (Signed)
Office Visit Note   Patient: [name]           Date of Birth: 07/26/1946           MRN: [id number] Visit Date: 09/06/2016              Requested by: Donato SchultzYvonne R Lowne Chase, DO [address] [place], [place] [id number] PCP: Donato SchultzYvonne R Lowne Chase, DO   Assessment & Plan: Visit Diagnoses: No diagnosis found.  Plan:  - MRI shows DDD and stenosis at C3-4, 4-5, 5-6, 6-7 - [facility] f/u prn   Follow-Up Instructions: Return if symptoms worsen or fail to improve.   Orders:  No orders of the defined types were placed in this encounter.  No orders of the defined types were placed in this encounter.     Procedures: No procedures performed   Clinical Data: No additional findings.   Subjective: Chief Complaint  Patient presents with  . Right Shoulder - Pain, Follow-up  . Neck - Pain, Follow-up    Patient is here for f/u visit to review MRI c spine.  Continues to have right shoulder pain.  Denies any myelopathic signs.      Review of Systems   Objective: Vital Signs: There were no vitals taken for this visit.  Physical Exam  Ortho Exam Negative Hoffman's Normal reflexes Specialty Comments:  No specialty comments available.  Imaging: No results found.   PMFS History: Patient Active Problem List   Diagnosis Date Noted  . Dyspnea 02/18/2016  . Chest pain 02/18/2016  . HLD (hyperlipidemia) 02/18/2016  . CAD in native artery 02/18/2016  . RLS (restless legs syndrome) 02/18/2016  . Pancreatitis 02/18/2016  . SOB (shortness of breath) 02/18/2016  . Uncontrolled type 2 diabetes mellitus with complication (HCC)   . Tobacco abuse   . Osteoarthritis of right knee 07/05/2015  . Smoking 07/02/2013  . Acute upper respiratory infections of unspecified site 07/02/2013  . Flu vaccine need 07/02/2013  . Multinodular goiter 04/16/2013  . Bunion 07/07/2012  . BACK PAIN, THORACIC REGION, RIGHT 05/24/2009  . IRON DEFICIENCY  03/25/2009  . GERD 03/25/2009  . WEIGHT LOSS 03/25/2009  . SINUSITIS- ACUTE-NOS 11/25/2008  . HYPERLIPIDEMIA 10/07/2008  . ACUTE PANCREATITIS 09/07/2008  . OVARIAN CYST 09/07/2008  . KNEE PAIN, RIGHT 05/24/2008  . CARPAL TUNNEL SYNDROME, RIGHT 05/12/2008  . Acute pain of right shoulder 04/02/2008  . ROTATOR CUFF REPAIR, RIGHT, HX OF 03/29/2008  . GOITER, MULTINODULAR 12/26/2007  . Depression 07/07/2007  . HIP PAIN, RIGHT 03/11/2007  . RESTLESS LEG SYNDROME, HX OF 03/11/2007  . Diabetes mellitus type II, uncontrolled (HCC) 03/07/2007  . Essential hypertension 03/07/2007   Past Medical History:  Diagnosis Date  . Blockage of coronary artery of heart (HCC)   . Depression   . Diabetes mellitus   . Hyperlipidemia   . Hypertension   . Pancreatitis   . Restless leg syndrome     Family History  Problem Relation Age of Onset  . Heart attack Father   . Lung cancer    . Thyroid cancer    . Uterine cancer    . Breast cancer Sister   . Cancer Other     lung,thyroid,breast,uterine  . Heart attack Sister   . Heart attack Brother 247    Past Surgical History:  Procedure Laterality Date  . CORONARY ANGIOPLASTY WITH STENT PLACEMENT    . FOOT SURGERY Right  cyst removal  . ROTATOR CUFF REPAIR     right  . TUBAL LIGATION     Social History   Occupational History  . Not on file.   Social History Main Topics  . Smoking status: Current Some Day Smoker    Packs/day: 0.20    Types: Cigarettes  . Smokeless tobacco: Never Used  . Alcohol use No  . Drug use: No  . Sexual activity: Not on file

## 2016-09-10 ENCOUNTER — Telehealth (INDEPENDENT_AMBULATORY_CARE_PROVIDER_SITE_OTHER): Payer: Self-pay | Admitting: Orthopaedic Surgery

## 2016-09-10 NOTE — Telephone Encounter (Signed)
Pt called asking  If she can be prescribed something for pain, she said Ibuprofen is not strong enough right now as her pain is becoming more frequent. Her phone # is [phone]

## 2016-09-10 NOTE — Telephone Encounter (Signed)
See message below °

## 2016-09-11 ENCOUNTER — Encounter: Payer: Self-pay | Admitting: Family Medicine

## 2016-09-11 DIAGNOSIS — Z79899 Other long term (current) drug therapy: Secondary | ICD-10-CM | POA: Diagnosis not present

## 2016-09-12 ENCOUNTER — Encounter: Payer: Self-pay | Admitting: Family Medicine

## 2016-09-12 ENCOUNTER — Ambulatory Visit (INDEPENDENT_AMBULATORY_CARE_PROVIDER_SITE_OTHER): Payer: Commercial Managed Care - HMO | Admitting: Family Medicine

## 2016-09-12 VITALS — BP 112/64 | Temp 98.0°F | Ht 65.0 in | Wt 175.6 lb

## 2016-09-12 DIAGNOSIS — B372 Candidiasis of skin and nail: Secondary | ICD-10-CM | POA: Diagnosis not present

## 2016-09-12 DIAGNOSIS — M792 Neuralgia and neuritis, unspecified: Secondary | ICD-10-CM

## 2016-09-12 MED ORDER — CLOTRIMAZOLE-BETAMETHASONE 1-0.05 % EX CREA: TOPICAL_CREAM | CUTANEOUS | 0 refills | Status: AC

## 2016-09-12 MED ORDER — GABAPENTIN 100 MG PO CAPS: 100.0000 mg | ORAL_CAPSULE | Freq: Three times a day (TID) | ORAL | 0 refills | Status: DC

## 2016-09-12 NOTE — Progress Notes (Signed)
Pre visit review using our clinic review tool, if applicable. No additional management support is needed unless otherwise documented below in the visit note. 

## 2016-09-12 NOTE — Progress Notes (Signed)
Chief Complaint  Patient presents with  . Rash    Pt reports rash on RT breast and has noticied it since 09/07/16  with itching painful swelling and burning     Subjective: Patient is a 70 y.o. female here for a rash on her Rt breast.  Noticed it 5 days ago, started off as itchy. Noticed it between her breasts as well. Some bumps now and it will be intermittently painful, describes as a burning. She did switch soaps around 1 mo ago. No other change in soaps, detergents, topicals or lotions. No fevers or sick contacts.   ROS: Skin: as noted in HPI  Family History  Problem Relation Age of Onset  . Heart attack Father   . Lung cancer    . Thyroid cancer    . Uterine cancer    . Breast cancer Sister   . Cancer Other     lung,thyroid,breast,uterine  . Heart attack Sister   . Heart attack Brother 65   Past Medical History:  Diagnosis Date  . Blockage of coronary artery of heart ([place])   . Depression   . Diabetes mellitus   . Hyperlipidemia   . Hypertension   . Pancreatitis   . Restless leg syndrome    Allergies  Allergen Reactions  . Levemir [Insulin Detemir] Hives and Swelling  . Metformin And Related Other (See Comments)    Sick on stomach, sweaty, throw up, pain    Current Outpatient Prescriptions:  .  alendronate (FOSAMAX) 70 MG tablet, Take 1 tablet (70 mg total) by mouth once a week. Take with full glass of water on empty stomach., Disp: 4 tablet, Rfl: 12 .  ALPRAZolam (XANAX) 0.25 MG tablet, Take 1 tablet by mouth three times a day if needed, Disp: 60 tablet, Rfl: 0 .  amLODipine (NORVASC) 10 MG tablet, take 1 tablet by mouth once daily, Disp: 30 tablet, Rfl: 5 .  aspirin EC 81 MG tablet, Take 81 mg by mouth daily., Disp: , Rfl:  .  atorvastatin (LIPITOR) 40 MG tablet, take 1 tablet by mouth once daily, Disp: 90 tablet, Rfl: 1 .  Blood Glucose Calibration (TAI DOC CONTROL) NORMAL SOLN, , Disp: , Rfl:  .  Blood Glucose Monitoring Suppl (ACCU-CHEK AVIVA PLUS) w/Device  KIT, Check blood sugar daily. E11.9, Disp: 1 kit, Rfl: 0 .  Carboxymethylcellulose Sodium (THERATEARS OP), Place 1 drop into both eyes daily as needed (dry eyes)., Disp: , Rfl:  .  CINNAMON PO, Take 1 tablet by mouth daily. , Disp: , Rfl:  .  Cyanocobalamin (VITAMIN B-12 PO), Take 1 tablet by mouth daily., Disp: , Rfl:  .  diclofenac (CATAFLAM) 50 MG tablet, Take 1 tablet (50 mg total) by mouth 2 (two) times daily., Disp: 14 tablet, Rfl: 0 .  ferrous sulfate 325 (65 FE) MG tablet, Take 325 mg by mouth daily with breakfast., Disp: , Rfl:  .  Flaxseed, Linseed, (FLAXSEED OIL PO), Take 1 capsule by mouth 2 (two) times daily. , Disp: , Rfl:  .  FLUoxetine (PROZAC) 20 MG capsule, take 1 capsule by mouth once daily, Disp: 30 capsule, Rfl: 5 .  fluticasone (FLONASE) 50 MCG/ACT nasal spray, Place 2 sprays into both nostrils daily., Disp: 16 g, Rfl: 6 .  gabapentin (NEURONTIN) 100 MG capsule, Take 1 capsule (100 mg total) by mouth 2 (two) times daily., Disp: , Rfl: 0 .  GINKGO BILOBA COMPLEX PO, Take 1 tablet by mouth daily. , Disp: , Rfl:  .  glucose blood (ACCU-CHEK AVIVA) test strip, Check blood sugar four times daily. E11.9, Disp: 400 each, Rfl: 3 .  HYDROcodone-acetaminophen (NORCO) 5-325 MG tablet, Take 1 tablet by mouth every 6 (six) hours as needed for moderate pain., Disp: 30 tablet, Rfl: 0 .  hydrOXYzine (ATARAX/VISTARIL) 10 MG tablet, Take 1 tablet (10 mg total) by mouth every 8 (eight) hours as needed for itching., Disp: 10 tablet, Rfl: 0 .  ibuprofen (ADVIL,MOTRIN) 200 MG tablet, Take 400 mg by mouth every 6 (six) hours as needed (pain)., Disp: , Rfl:  .  Insulin Pen Needle (PEN NEEDLES 31GX5/16") 31G X 8 MM MISC, by Does not apply route daily.  , Disp: , Rfl:  .  insulin regular (NOVOLIN R RELION) 100 units/mL injection, Inject 0.3 mLs (30 Units total) into the skin daily as needed for high blood sugar (at noon if CBG >200 and will be eating)., Disp: 10 mL, Rfl:  .  Insulin Syringe-Needle U-100  (BD INSULIN SYRINGE ULTRAFINE) 31G X 5/16" 0.3 ML MISC, 10 Units by Does not apply route 3 (three) times daily before meals.  , Disp: , Rfl:  .  Lancet Devices (LANCING DEVICE) MISC, , Disp: , Rfl:  .  levocetirizine (XYZAL) 5 MG tablet, Take 1 tablet (5 mg total) by mouth every evening., Disp: 30 tablet, Rfl: 5 .  lisinopril (PRINIVIL,ZESTRIL) 10 MG tablet, Take 1 tablet (10 mg total) by mouth daily., Disp: 90 tablet, Rfl: 3 .  meloxicam (MOBIC) 15 MG tablet, 1/2 - 1 po qd prn, Disp: 30 tablet, Rfl: 0 .  metoprolol succinate (TOPROL-XL) 50 MG 24 hr tablet, take 1 tablet by mouth once daily with food, Disp: 90 tablet, Rfl: 1 .  Multiple Vitamin (MULTIVITAMIN WITH MINERALS) TABS, Take 1 tablet by mouth daily., Disp: , Rfl:  .  NOVOLIN N RELION 100 UNIT/ML injection, Inject 30-60 Units into the skin 2 (two) times daily. Inject 30 units subcutaneously before breakfast and 60 units at bedtime, Disp: , Rfl:  .  ondansetron (ZOFRAN) 4 MG tablet, Take 1 tablet (4 mg total) by mouth every 6 (six) hours., Disp: 12 tablet, Rfl: 0 .  PHARMACIST CHOICE LANCETS MISC, , Disp: , Rfl:  .  tiZANidine (ZANAFLEX) 4 MG tablet, Take 1 tablet (4 mg total) by mouth every 6 (six) hours as needed for muscle spasms., Disp: 30 tablet, Rfl: 0 .  vitamin E 400 UNIT capsule, Take 400 Units by mouth daily., Disp: , Rfl:  .  clotrimazole-betamethasone (LOTRISONE) cream, Apply thin layer to affected areas on chest twice daily., Disp: 45 g, Rfl: 0 .  cyclobenzaprine (FLEXERIL) 10 MG tablet, Take 1 tablet (10 mg total) by mouth 2 (two) times daily as needed for muscle spasms. (Patient not taking: Reported on 09/12/2016), Disp: 20 tablet, Rfl: 0 .  gabapentin (NEURONTIN) 100 MG capsule, Take 1 capsule (100 mg total) by mouth 3 (three) times daily., Disp: 30 capsule, Rfl: 0  Objective: BP 112/64 (BP Location: Left Arm, Patient Position: Sitting, Cuff Size: Large)   Temp 98 F (36.7 C) (Oral)   Ht 5' 5" (1.651 m)   Wt 175 lb 9.6 oz  (79.7 kg)   BMI 29.22 kg/m  General: Awake, appears stated age Heart: RRR, no murmurs Lungs: CTAB, no rales, wheezes or rhonchi. No accessory muscle use Skin: Over the sternum, there is a small area of erythema approximately 2 x 0.4 cm in dimension. It is not tender to palpation and there is no drainage or fluid-filled lesions.  On the lateral aspect of the right breast at approximately 10:00, there is an area of erythema approximately 2.6 x 1.6 cm with overlying vesicles. There is no tenderness to palpation, evidence of excoriation, scaling, or drainage.  Psych: Age appropriate judgment and insight, normal affect and mood  Assessment and Plan: Yeast dermatitis - Plan: clotrimazole-betamethasone (LOTRISONE) cream  Neuropathic pain - Plan: gabapentin (NEURONTIN) 100 MG capsule  Orders as above. Her history is supportive of these infection given the itching and lack of improvement with the steroid. Her physical exam makes me consider shingles. Regardless, she is outside of the window for an antiviral medication. I added a small amount of Neurontin (she is already on it and is elderly) for neuropathic pain to take if she does not improve with the cream. There is also a likelihood that the vesicles are a result of her scratching. Follow-up if symptoms worsen or fail to improve. The patient voiced understanding and agreement to the plan.  Dane, DO 09/12/16  10:57 AM

## 2016-09-12 NOTE — Patient Instructions (Signed)
Use cream first. If no improvement, take the medicine in addition to your current Neurontin dose.

## 2016-09-14 ENCOUNTER — Telehealth (INDEPENDENT_AMBULATORY_CARE_PROVIDER_SITE_OTHER): Payer: Self-pay | Admitting: Orthopaedic Surgery

## 2016-09-14 NOTE — Telephone Encounter (Signed)
Sure. Wallace GoingWhat's she want to know

## 2016-09-14 NOTE — Telephone Encounter (Signed)
Pt calling to ask about inj she received on 10/31. She wants to know exactly what it is... Pt asking for call back at [phone]

## 2016-09-14 NOTE — Telephone Encounter (Signed)
Please advise 

## 2016-09-14 NOTE — Telephone Encounter (Signed)
Called pt to advise on message 

## 2016-09-15 DIAGNOSIS — B029 Zoster without complications: Secondary | ICD-10-CM | POA: Diagnosis not present

## 2016-09-25 ENCOUNTER — Encounter (INDEPENDENT_AMBULATORY_CARE_PROVIDER_SITE_OTHER): Payer: Self-pay | Admitting: Physical Medicine and Rehabilitation

## 2016-09-25 ENCOUNTER — Ambulatory Visit (INDEPENDENT_AMBULATORY_CARE_PROVIDER_SITE_OTHER): Payer: Worker's Compensation | Admitting: Physical Medicine and Rehabilitation

## 2016-09-25 VITALS — BP 133/69 | HR 54

## 2016-09-25 DIAGNOSIS — M4802 Spinal stenosis, cervical region: Secondary | ICD-10-CM | POA: Diagnosis not present

## 2016-09-25 DIAGNOSIS — M5412 Radiculopathy, cervical region: Secondary | ICD-10-CM

## 2016-09-25 MED ORDER — GABAPENTIN 300 MG PO CAPS: ORAL_CAPSULE | ORAL | 1 refills | Status: DC

## 2016-09-25 NOTE — Progress Notes (Signed)
Emily ManifoldShirley C Nicholson - 70 y.o. female MRN [id number]  Date of birth: 12/01/1945  Office Visit Note: Visit Date: 09/25/2016 PCP: Donato SchultzYvonne R Lowne Chase, DO Referred by: Donato SchultzLowne Chase, Yvonne R, *  Subjective: Chief Complaint  Patient presents with  . Neck - Pain   HPI: [name] is a 70 year old female who has been followed by Dr. Roda ShuttersXu for her neck and shoulder pain. He ultimately did get an MRI of her cervical spine which is reviewed below. Basically she comes in today complaining of severe neck pain and bilateral referring into both shoulders with some numbness and tingling in the anterior and posterior left more than right shoulder but bilateral. She gets some symptoms down the right arm into the thumb and more of a C6 distribution. She's had no prior cervical surgery. She reports that she's had symptoms which were present since injury at work on 05/26/2016. This is documented in Dr. Warren DanesXu's notes. I would refer anything from an injury standpoint to Dr. Roda ShuttersXu. Reviewing her MRI that does show severe multilevel stenosis. This is a degenerative process but is very tight particularly at C6-7. This would fit with her symptoms. She has failed care including therapy as well as medications. She does take some hydrocodone but is not helping. She has taken anti-inflammatories with no relief. She also takes a small bit of gabapentin I think for restless leg syndrome. She has a recent report of shingles as well but was not in this distribution. She reports no focal weakness or specific night pain. No fevers chills or night sweats. She reports her pain can be as high as an 8 out of 10. She does have numbness and tingling.    Neck Pain, bilateral shoulder pain. Tingling posterior shoulders. Symptoms present since 05/26/16 injury at work. Recent shingles also. Review of Systems  Constitutional: Negative for chills, fever, malaise/fatigue and weight loss.  HENT: Negative for hearing loss and sinus pain.   Eyes: Negative  for blurred vision, double vision and photophobia.  Respiratory: Negative for cough and shortness of breath.   Cardiovascular: Negative for chest pain, palpitations and leg swelling.  Gastrointestinal: Negative for abdominal pain, nausea and vomiting.  Genitourinary: Negative for flank pain.  Musculoskeletal: Positive for joint pain and neck pain. Negative for myalgias.  Skin: Negative for itching and rash.  Neurological: Positive for tingling. Negative for tremors, focal weakness and weakness.  Endo/Heme/Allergies: Negative.   Psychiatric/Behavioral: Negative for depression.  All other systems reviewed and are negative.  Otherwise per HPI.  Assessment & Plan: Visit Diagnoses:  1. Cervical radiculopathy   2. Spinal stenosis of cervical region     Plan: Findings:  Chronic history but recent exacerbation after work injury of bilateral neck pain and bilateral shoulder pain with numbness and tingling and particular right radicular distribution of the C6 nerve root. She does have some tingling and paresthesias and mild sensory change on exam. She has no focal weakness. She has no abnormal reflexes. She does have severe central stenosis particularly at C6-7. After discussing this with her we did go over potential for injections to help with the symptoms but I did counsel her on she probably should see a surgeon to at least get an opinion on her cervical spine. She would be at higher risk for central cord syndrome with any sort of trauma. She does wish to see a surgeon just for an opinion at this point and we will go ahead and make that referral. I also discussed with  her about increasing her gabapentin. She is on a very small dose of 100 mg twice a day. With increased that to 300 mg at night and then twice a day in  Times a day depending on her tolerating that.I spent more than 25 minutes speaking face-to-face with the patient with 50% of the time in counseling.    Meds & Orders:  Meds ordered this  encounter  Medications  . gabapentin (NEURONTIN) 300 MG capsule    Sig: Take 1 capsule by mouth at night for 7 days and then one by mouth twice per day. He is tolerating may take up to 3 times per day.    Dispense:  180 capsule    Refill:  1    Orders Placed This Encounter  Procedures  . Ambulatory referral to Neurosurgery    Follow-up: Return for C7-T1 interlaminar epidural steroid injection with me depending on her consultation with the surgeon.   Procedures: No procedures performed  No notes on file   Clinical History: Cervical spine MRI dated 09/04/2016 IMPRESSION:  1. Cervical spondylosis. There is severe spinal stenosis at C4-5, C5-6 and C6-7 with flattening of the spinal cord. Moderate spinal stenosis at C3-4. 2. Large multinodular thyroid goiter  She reports that she has been smoking Cigarettes.  She has been smoking about 0.20 packs per day. She has never used smokeless tobacco.   Recent Labs  11/14/15 1042  HGBA1C 11.1*    Objective:  VS:  HT:    WT:   BMI:     BP:133/69  HR:(!) 54bpm  TEMP: ( )  RESP:  Physical Exam  Constitutional: She is oriented to person, place, and time. She appears well-developed and well-nourished. No distress.  HENT:  Head: Normocephalic and atraumatic.  Nose: Nose normal.  Mouth/Throat: Oropharynx is clear and moist.  Eyes: Conjunctivae are normal. Pupils are equal, round, and reactive to light.  Neck: Normal range of motion. Neck supple.  Cardiovascular: Regular rhythm and intact distal pulses.   Pulmonary/Chest: Effort normal and breath sounds normal.  Abdominal: She exhibits no distension.  Musculoskeletal:  Cervical range of motion is limited with rotation and extension. There is a negative Spurling's test bilaterally. She does not appear to have significant impingement signs of the shoulders although she does get a little pain on the right. She has good distal strength bilaterally and symmetric. She has mild decreased sensation  to light touch in the C6 dermatome on the right. She has 2+ muscle stretch reflexes at the biceps and brachioradialis. She has a negative Hoffmann's test bilaterally.  Neurological: She is alert and oriented to person, place, and time. She displays normal reflexes. She exhibits normal muscle tone. Coordination normal.  Skin: Skin is warm. No rash noted. No erythema.  Psychiatric: She has a normal mood and affect. Her behavior is normal.  Nursing note and vitals reviewed.   Ortho Exam Imaging: No results found.  Past Medical/Family/Surgical/Social History: Medications & Allergies reviewed per EMR Patient Active Problem List   Diagnosis Date Noted  . Spinal stenosis of cervical region 09/25/2016  . Cervical radiculopathy 09/06/2016  . Dyspnea 02/18/2016  . Chest pain 02/18/2016  . HLD (hyperlipidemia) 02/18/2016  . CAD in native artery 02/18/2016  . RLS (restless legs syndrome) 02/18/2016  . Pancreatitis 02/18/2016  . SOB (shortness of breath) 02/18/2016  . Uncontrolled type 2 diabetes mellitus with complication (HCC)   . Tobacco abuse   . Osteoarthritis of right knee 07/05/2015  . Smoking 07/02/2013  .  Acute upper respiratory infections of unspecified site 07/02/2013  . Flu vaccine need 07/02/2013  . Multinodular goiter 04/16/2013  . Bunion 07/07/2012  . BACK PAIN, THORACIC REGION, RIGHT 05/24/2009  . IRON DEFICIENCY 03/25/2009  . GERD 03/25/2009  . WEIGHT LOSS 03/25/2009  . SINUSITIS- ACUTE-NOS 11/25/2008  . HYPERLIPIDEMIA 10/07/2008  . ACUTE PANCREATITIS 09/07/2008  . OVARIAN CYST 09/07/2008  . KNEE PAIN, RIGHT 05/24/2008  . CARPAL TUNNEL SYNDROME, RIGHT 05/12/2008  . Acute pain of right shoulder 04/02/2008  . ROTATOR CUFF REPAIR, RIGHT, HX OF 03/29/2008  . GOITER, MULTINODULAR 12/26/2007  . Depression 07/07/2007  . HIP PAIN, RIGHT 03/11/2007  . RESTLESS LEG SYNDROME, HX OF 03/11/2007  . Diabetes mellitus type II, uncontrolled (HCC) 03/07/2007  . Essential  hypertension 03/07/2007   Past Medical History:  Diagnosis Date  . Blockage of coronary artery of heart (HCC)   . Depression   . Diabetes mellitus   . Hyperlipidemia   . Hypertension   . Pancreatitis   . Restless leg syndrome    Family History  Problem Relation Age of Onset  . Heart attack Father   . Lung cancer    . Thyroid cancer    . Uterine cancer    . Breast cancer Sister   . Cancer Other     lung,thyroid,breast,uterine  . Heart attack Sister   . Heart attack Brother 58   Past Surgical History:  Procedure Laterality Date  . CORONARY ANGIOPLASTY WITH STENT PLACEMENT    . FOOT SURGERY Right    cyst removal  . ROTATOR CUFF REPAIR     right  . TUBAL LIGATION     Social History   Occupational History  . Not on file.   Social History Main Topics  . Smoking status: Current Some Day Smoker    Packs/day: 0.20    Types: Cigarettes  . Smokeless tobacco: Never Used  . Alcohol use No  . Drug use: No  . Sexual activity: Not on file

## 2016-10-08 ENCOUNTER — Other Ambulatory Visit: Payer: Self-pay | Admitting: Family Medicine

## 2016-10-10 ENCOUNTER — Ambulatory Visit (HOSPITAL_COMMUNITY)
Admission: EM | Admit: 2016-10-10 | Discharge: 2016-10-10 | Disposition: A | Discharge status: Home / Self Care | Payer: Medicare HMO | Attending: Emergency Medicine | Admitting: Emergency Medicine

## 2016-10-10 ENCOUNTER — Telehealth: Payer: Self-pay | Admitting: Family Medicine

## 2016-10-10 ENCOUNTER — Encounter (HOSPITAL_COMMUNITY): Payer: Self-pay | Admitting: *Deleted

## 2016-10-10 DIAGNOSIS — J069 Acute upper respiratory infection, unspecified: Secondary | ICD-10-CM

## 2016-10-10 DIAGNOSIS — R05 Cough: Secondary | ICD-10-CM

## 2016-10-10 DIAGNOSIS — H9313 Tinnitus, bilateral: Secondary | ICD-10-CM

## 2016-10-10 DIAGNOSIS — R059 Cough, unspecified: Secondary | ICD-10-CM

## 2016-10-10 MED ORDER — AZITHROMYCIN 250 MG PO TABS: 250.0000 mg | ORAL_TABLET | Freq: Every day | ORAL | 0 refills | Status: DC

## 2016-10-10 MED ORDER — IPRATROPIUM BROMIDE 0.06 % NA SOLN: 2.0000 | Freq: Four times a day (QID) | NASAL | 0 refills | Status: DC

## 2016-10-10 MED ORDER — BENZONATATE 100 MG PO CAPS: 100.0000 mg | ORAL_CAPSULE | Freq: Three times a day (TID) | ORAL | 0 refills | Status: DC

## 2016-10-10 NOTE — ED Provider Notes (Signed)
CSN: [id number]     Arrival date & time 10/10/16  1153 History   First MD Initiated Contact with Patient 10/10/16 1251     Chief Complaint  Patient presents with  . Cough   (Consider location/radiation/quality/duration/timing/severity/associated sxs/prior Treatment) Patient c/o cough, ringing in ears, sore throat, and feeling tired.   The history is provided by the patient.  Cough  Cough characteristics:  Non-productive Sputum characteristics:  White Severity:  Moderate Onset quality:  Sudden Duration:  1 week Timing:  Constant Progression:  Worsening Chronicity:  New Smoker: no   Relieved by:  Nothing Worsened by:  Nothing Ineffective treatments:  None tried   Past Medical History:  Diagnosis Date  . Blockage of coronary artery of heart ([place])   . Depression   . Diabetes mellitus   . Hyperlipidemia   . Hypertension   . Pancreatitis   . Restless leg syndrome    Past Surgical History:  Procedure Laterality Date  . CORONARY ANGIOPLASTY WITH STENT PLACEMENT    . FOOT SURGERY Right    cyst removal  . ROTATOR CUFF REPAIR     right  . TUBAL LIGATION     Family History  Problem Relation Age of Onset  . Heart attack Father   . Lung cancer    . Thyroid cancer    . Uterine cancer    . Breast cancer Sister   . Cancer Other     lung,thyroid,breast,uterine  . Heart attack Sister   . Heart attack Brother 28   Social History  Substance Use Topics  . Smoking status: Current Some Day Smoker    Packs/day: 0.20    Types: Cigarettes  . Smokeless tobacco: Never Used  . Alcohol use No   OB History    No data available     Review of Systems  Constitutional: Negative.   HENT: Negative.   Eyes: Negative.   Respiratory: Positive for cough.   Cardiovascular: Negative.   Gastrointestinal: Negative.   Endocrine: Negative.   Genitourinary: Negative.   Musculoskeletal: Negative.   Allergic/Immunologic: Negative.   Neurological: Negative.   Hematological: Negative.    Psychiatric/Behavioral: Negative.     Allergies  Levemir [insulin detemir] and Metformin and related  Home Medications   Prior to Admission medications   Medication Sig Start Date End Date Taking? Authorizing Provider  alendronate (FOSAMAX) 70 MG tablet Take 1 tablet (70 mg total) by mouth once a week. Take with full glass of water on empty stomach. 08/21/16   Rosalita Chessman Chase, DO  ALPRAZolam Duanne Moron) 0.25 MG tablet Take 1 tablet by mouth three times a day if needed 08/23/16   Rosalita Chessman Chase, DO  amLODipine (NORVASC) 10 MG tablet take 1 tablet by mouth once daily 04/09/16   Rosalita Chessman Chase, DO  aspirin EC 81 MG tablet Take 81 mg by mouth daily.    Historical Provider, MD  atorvastatin (LIPITOR) 40 MG tablet take 1 tablet by mouth once daily 09/06/16   Alferd Apa Lowne Chase, DO  azithromycin (ZITHROMAX) 250 MG tablet Take 1 tablet (250 mg total) by mouth daily. Take first 2 tablets together, then 1 every day until finished. 10/10/16   Lysbeth Penner, FNP  benzonatate (TESSALON) 100 MG capsule Take 1 capsule (100 mg total) by mouth every 8 (eight) hours. 10/10/16   Lysbeth Penner, FNP  Blood Glucose Calibration ([facility] CONTROL) NORMAL SOLN  10/19/12   Historical Provider, MD  Blood Glucose Monitoring Suppl (  ACCU-CHEK AVIVA PLUS) w/Device KIT Check blood sugar daily. E11.9 11/25/15   Alferd Apa Lowne Chase, DO  Carboxymethylcellulose Sodium (THERATEARS OP) Place 1 drop into both eyes daily as needed (dry eyes).    Historical Provider, MD  CINNAMON PO Take 1 tablet by mouth daily.     Historical Provider, MD  Cyanocobalamin (VITAMIN B-12 PO) Take 1 tablet by mouth daily.    Historical Provider, MD  cyclobenzaprine (FLEXERIL) 10 MG tablet Take 1 tablet (10 mg total) by mouth 2 (two) times daily as needed for muscle spasms. 05/27/16   Konrad Felix, PA  diclofenac (CATAFLAM) 50 MG tablet Take 1 tablet (50 mg total) by mouth 2 (two) times daily. 05/29/16   Percell Miller Saguier, PA-C  ferrous  sulfate 325 (65 FE) MG tablet Take 325 mg by mouth daily with breakfast.    Historical Provider, MD  Flaxseed, Linseed, (FLAXSEED OIL PO) Take 1 capsule by mouth 2 (two) times daily.     Historical Provider, MD  FLUoxetine (PROZAC) 20 MG capsule take 1 capsule by mouth once daily 09/22/15   Alferd Apa Lowne Chase, DO  fluticasone [facility]) 50 MCG/ACT nasal spray Place 2 sprays into both nostrils daily. 11/14/15   Rosalita Chessman Chase, DO  gabapentin (NEURONTIN) 300 MG capsule Take 1 capsule by mouth at night for 7 days and then one by mouth twice per day. He is tolerating may take up to 3 times per day. 09/25/16   Magnus Sinning, MD  GINKGO BILOBA COMPLEX PO Take 1 tablet by mouth daily.     Historical Provider, MD  glucose blood (ACCU-CHEK AVIVA) test strip Check blood sugar four times daily. E11.9 12/14/14   Alferd Apa Lowne Chase, DO  ibuprofen (ADVIL,MOTRIN) 200 MG tablet Take 400 mg by mouth every 6 (six) hours as needed (pain).    Historical Provider, MD  Insulin Pen Needle (PEN NEEDLES 31GX5/16") 31G X 8 MM MISC by Does not apply route daily.      Historical Provider, MD  insulin regular (NOVOLIN R RELION) 100 units/mL injection Inject 0.3 mLs (30 Units total) into the skin daily as needed for high blood sugar (at noon if CBG >200 and will be eating). 03/01/16   Rosalita Chessman Chase, DO  Insulin Syringe-Needle U-100 (BD INSULIN SYRINGE ULTRAFINE) 31G X 5/16" 0.3 ML MISC 10 Units by Does not apply route 3 (three) times daily before meals.      Historical Provider, MD  ipratropium (ATROVENT) 0.06 % nasal spray Place 2 sprays into both nostrils 4 (four) times daily. 10/10/16   Lysbeth Penner, FNP  Lancet Devices (LANCING DEVICE) MISC  10/19/12   Historical Provider, MD  levocetirizine (XYZAL) 5 MG tablet Take 1 tablet (5 mg total) by mouth every evening. 11/14/15   Rosalita Chessman Chase, DO  lisinopril (PRINIVIL,ZESTRIL) 10 MG tablet Take 1 tablet (10 mg total) by mouth daily. 03/01/16   Rosalita Chessman Chase,  DO  metoprolol succinate (TOPROL-XL) 50 MG 24 hr tablet take 1 tablet by mouth once daily with food 06/08/16   Rosalita Chessman Chase, DO  metoprolol succinate (TOPROL-XL) 50 MG 24 hr tablet take 1 tablet by mouth once daily with food 10/09/16   Ann Held, DO  Multiple Vitamin (MULTIVITAMIN WITH MINERALS) TABS Take 1 tablet by mouth daily.    Historical Provider, MD  NOVOLIN N RELION 100 UNIT/ML injection Inject 30-60 Units into the skin 2 (two) times daily. Inject 30 units subcutaneously  before breakfast and 60 units at bedtime 05/05/12   Historical Provider, MD  [place]  10/31/13   Historical Provider, MD  valACYclovir (VALTREX) 1000 MG tablet  09/15/16   Historical Provider, MD  vitamin E 400 UNIT capsule Take 400 Units by mouth daily.    Historical Provider, MD   Meds Ordered and Administered this Visit  Medications - No data to display  BP (!) 138/53 (BP Location: Right Arm)   Pulse (!) 55   Temp 98.6 F (37 C) (Oral)   Resp 20   SpO2 100%  No data found.   Physical Exam  Constitutional: She appears well-developed and well-nourished.  HENT:  Head: Normocephalic and atraumatic.  Right Ear: External ear normal.  Left Ear: External ear normal.  Mouth/Throat: Oropharynx is clear and moist.  Eyes: Conjunctivae and EOM are normal. Pupils are equal, round, and reactive to light.  Neck: Normal range of motion. Neck supple.  Cardiovascular: Normal rate, regular rhythm and normal heart sounds.   Pulmonary/Chest: Effort normal and breath sounds normal.  Abdominal: Soft. Bowel sounds are normal.  Nursing note and vitals reviewed.   Urgent Care Course   Clinical Course     Procedures (including critical care time)  Labs Review Labs Reviewed - No data to display  Imaging Review No results found.   Visual Acuity Review  Right Eye Distance:   Left Eye Distance:   Bilateral Distance:    Right Eye Near:   Left Eye Near:    Bilateral Near:          MDM   1. Acute upper respiratory infection   2. Tinnitus of both ears   3. Cough    Medrol dose pack as directed #21 28m Zpak Tessalon perles  Push po fluids, rest, tylenol and motrin otc prn as directed for fever, arthralgias, and myalgias.  Follow up prn if sx's continue or persist.    WLysbeth Penner FNP 10/10/16 [place]01/03/18 [phone]

## 2016-10-10 NOTE — Telephone Encounter (Signed)
Patient scheduled for 12/24/16 at 9:30am with Eber Jonesarolyn and physical with PCP at 10:30am.

## 2016-10-10 NOTE — ED Triage Notes (Signed)
Pt  Reports  Symptoms  Of  Cough   Congested  Ringing in  Her  Ears  As  Well  As  Her  Throat  Burning  When  She  Coughs     Pt  Reports  The  Cough  Is  For  The  Most part  Non  Productive

## 2016-10-11 ENCOUNTER — Encounter: Payer: Self-pay | Admitting: Family Medicine

## 2016-10-11 ENCOUNTER — Ambulatory Visit (INDEPENDENT_AMBULATORY_CARE_PROVIDER_SITE_OTHER): Payer: Medicare HMO | Admitting: Family Medicine

## 2016-10-11 VITALS — BP 133/53 | HR 57 | Temp 98.4°F | Resp 16 | Ht 65.0 in | Wt 178.8 lb

## 2016-10-11 DIAGNOSIS — J209 Acute bronchitis, unspecified: Secondary | ICD-10-CM | POA: Diagnosis not present

## 2016-10-11 DIAGNOSIS — R05 Cough: Secondary | ICD-10-CM

## 2016-10-11 DIAGNOSIS — R059 Cough, unspecified: Secondary | ICD-10-CM

## 2016-10-11 MED ORDER — PREDNISONE 10 MG PO TABS: ORAL_TABLET | ORAL | 0 refills | Status: DC

## 2016-10-11 MED ORDER — PROMETHAZINE-DM 6.25-15 MG/5ML PO SYRP: 5.0000 mL | ORAL_SOLUTION | Freq: Four times a day (QID) | ORAL | 0 refills | Status: DC | PRN

## 2016-10-11 MED FILL — predniSONE 10 MG TABS: 10 | 9 days supply | Qty: 18 | Fill #0

## 2016-10-11 MED FILL — PROMETHAZINE-DM SYRUP: 6.25-15 | 6 days supply | Qty: 118 | Fill #0

## 2016-10-11 NOTE — Progress Notes (Signed)
Patient ID: [name], female    DOB: April 25, 1946  Age: 71 y.o. MRN: [id number]    Subjective:  Subjective  HPI [name] presents for cough and congestion.  She was seen in ER yesterday and was given z pack and tessalon perles.  She did not get the medrol dose pack filled.  Pt states cough is no better  Review of Systems  Constitutional: Negative for chills and fever.  HENT: Positive for congestion, postnasal drip and rhinorrhea. Negative for sinus pressure.   Respiratory: Positive for cough. Negative for chest tightness, shortness of breath and wheezing.   Cardiovascular: Negative for chest pain, palpitations and leg swelling.  Allergic/Immunologic: Negative for environmental allergies.    History Past Medical History:  Diagnosis Date  . Blockage of coronary artery of heart ([place])   . Depression   . Diabetes mellitus   . Hyperlipidemia   . Hypertension   . Pancreatitis   . Restless leg syndrome     She has a past surgical history that includes Tubal ligation; Rotator cuff repair; Coronary angioplasty with stent; and Foot surgery (Right).   Her family history includes Breast cancer in her sister; Cancer in her other; Heart attack in her father and sister; Heart attack (age of onset: 35) in her brother.She reports that she has been smoking Cigarettes.  She has been smoking about 0.20 packs per day. She has never used smokeless tobacco. She reports that she does not drink alcohol or use drugs.  Current Outpatient Prescriptions on File Prior to Visit  Medication Sig Dispense Refill  . alendronate (FOSAMAX) 70 MG tablet Take 1 tablet (70 mg total) by mouth once a week. Take with full glass of water on empty stomach. 4 tablet 12  . ALPRAZolam (XANAX) 0.25 MG tablet Take 1 tablet by mouth three times a day if needed 60 tablet 0  . amLODipine (NORVASC) 10 MG tablet take 1 tablet by mouth once daily 30 tablet 5  . aspirin EC 81 MG tablet Take 81 mg by mouth daily.    [name]  atorvastatin (LIPITOR) 40 MG tablet take 1 tablet by mouth once daily 90 tablet 1  . azithromycin (ZITHROMAX) 250 MG tablet Take 1 tablet (250 mg total) by mouth daily. Take first 2 tablets together, then 1 every day until finished. 6 tablet 0  . benzonatate (TESSALON) 100 MG capsule Take 1 capsule (100 mg total) by mouth every 8 (eight) hours. 21 capsule 0  . Blood Glucose Calibration (TAI DOC CONTROL) NORMAL SOLN     . Blood Glucose Monitoring Suppl (ACCU-CHEK AVIVA PLUS) w/Device KIT Check blood sugar daily. E11.9 1 kit 0  . Carboxymethylcellulose Sodium (THERATEARS OP) Place 1 drop into both eyes daily as needed (dry eyes).    . CINNAMON PO Take 1 tablet by mouth daily.     . Cyanocobalamin (VITAMIN B-12 PO) Take 1 tablet by mouth daily.    . ferrous sulfate 325 (65 FE) MG tablet Take 325 mg by mouth daily with breakfast.    . Flaxseed, Linseed, (FLAXSEED OIL PO) Take 1 capsule by mouth 2 (two) times daily.     [name] FLUoxetine (PROZAC) 20 MG capsule take 1 capsule by mouth once daily 30 capsule 5  . fluticasone (FLONASE) 50 MCG/ACT nasal spray Place 2 sprays into both nostrils daily. 16 g 6  . gabapentin (NEURONTIN) 300 MG capsule Take 1 capsule by mouth at night for 7 days and then one by mouth twice per day.  He is tolerating may take up to 3 times per day. 180 capsule 1  . GINKGO BILOBA COMPLEX PO Take 1 tablet by mouth daily.     [name] glucose blood (ACCU-CHEK AVIVA) test strip Check blood sugar four times daily. E11.9 400 each 3  . insulin regular (NOVOLIN R RELION) 100 units/mL injection Inject 0.3 mLs (30 Units total) into the skin daily as needed for high blood sugar (at noon if CBG >200 and will be eating). 10 mL   . Insulin Syringe-Needle U-100 (BD INSULIN SYRINGE ULTRAFINE) 31G X 5/16" 0.3 ML MISC 10 Units by Does not apply route 3 (three) times daily before meals.      Elmore Guise Devices (LANCING DEVICE) MISC     . levocetirizine (XYZAL) 5 MG tablet Take 1 tablet (5 mg total) by mouth every  evening. 30 tablet 5  . lisinopril (PRINIVIL,ZESTRIL) 10 MG tablet Take 1 tablet (10 mg total) by mouth daily. 90 tablet 3  . metoprolol succinate (TOPROL-XL) 50 MG 24 hr tablet take 1 tablet by mouth once daily with food 90 tablet 1  . Multiple Vitamin (MULTIVITAMIN WITH MINERALS) TABS Take 1 tablet by mouth daily.    [name] NOVOLIN N RELION 100 UNIT/ML injection Inject 30-60 Units into the skin 2 (two) times daily. Inject 30 units subcutaneously before breakfast and 60 units at bedtime    . PHARMACIST CHOICE LANCETS MISC     . vitamin E 400 UNIT capsule Take 400 Units by mouth daily.     No current facility-administered medications on file prior to visit.      Objective:  Objective  Physical Exam  Constitutional: She is oriented to person, place, and time. She appears well-developed and well-nourished.  HENT:  Right Ear: External ear normal.  Left Ear: External ear normal.  + PND + errythema  Eyes: Conjunctivae are normal. Right eye exhibits no discharge. Left eye exhibits no discharge.  Cardiovascular: Normal rate, regular rhythm and normal heart sounds.   No murmur heard. Pulmonary/Chest: Effort normal and breath sounds normal. No respiratory distress. She has no wheezes. She has no rales. She exhibits no tenderness.  Musculoskeletal: She exhibits no edema.  Lymphadenopathy:    She has cervical adenopathy.  Neurological: She is alert and oriented to person, place, and time.  Psychiatric: She has a normal mood and affect. Her behavior is normal. Judgment and thought content normal.   BP (!) 133/53 (BP Location: Right Arm, Cuff Size: Large)   Pulse (!) 57   Temp 98.4 F (36.9 C) (Oral)   Resp 16   Ht '5\' 5"'  (1.651 m)   Wt 178 lb 12.8 oz (81.1 kg)   SpO2 100%   BMI 29.75 kg/m  Wt Readings from Last 3 Encounters:  10/11/16 178 lb 12.8 oz (81.1 kg)  09/12/16 175 lb 9.6 oz (79.7 kg)  08/07/16 172 lb (78 kg)     Lab Results  Component Value Date   WBC 7.6 02/19/2016   HGB  11.8 (L) 02/19/2016   HCT 35.6 (L) 02/19/2016   PLT 155 02/19/2016   GLUCOSE 226 (H) 02/18/2016   CHOL 128 02/18/2016   TRIG 61 02/18/2016   HDL 36 (L) 02/18/2016   LDLDIRECT 59.0 11/14/2015   LDLCALC 80 02/18/2016   ALT 24 02/18/2016   AST 19 02/18/2016   NA 136 02/18/2016   K 3.7 02/18/2016   CL 102 02/18/2016   CREATININE 0.68 02/18/2016   BUN 14 02/18/2016   CO2 26  02/18/2016   TSH 0.68 06/02/2015   INR 1.1 03/03/2009   HGBA1C 11.1 (H) 11/14/2015   MICROALBUR 1.7 06/23/2015    No results found.   Assessment & Plan:  Plan  I have discontinued Ms. Soulier's PEN NEEDLES 31GX5/16", ibuprofen, cyclobenzaprine, diclofenac, valACYclovir, and ipratropium. I am also having her start on predniSONE. Additionally, I am having her maintain her Insulin Syringe-Needle U-100, NOVOLIN N RELION, multivitamin with minerals, Lancing Device, TAI DOC CONTROL, aspirin EC, PHARMACIST CHOICE LANCETS, glucose blood, (Flaxseed, Linseed, (FLAXSEED OIL PO)), CINNAMON PO, GINKGO BILOBA COMPLEX PO, FLUoxetine, levocetirizine, fluticasone, ACCU-CHEK AVIVA PLUS, ferrous sulfate, Cyanocobalamin (VITAMIN B-12 PO), vitamin E, Carboxymethylcellulose Sodium (THERATEARS OP), insulin regular, lisinopril, amLODipine, metoprolol succinate, alendronate, ALPRAZolam, atorvastatin, gabapentin, azithromycin, benzonatate, gabapentin, and promethazine-dextromethorphan.  Meds ordered this encounter  Medications  . gabapentin (NEURONTIN) 100 MG capsule    Sig: take 1 capsule by mouth three times a day    Refill:  0  . DISCONTD: promethazine-dextromethorphan (PROMETHAZINE-DM) 6.25-15 MG/5ML syrup    Sig: Take 5 mLs by mouth 4 (four) times daily as needed.    Dispense:  118 mL    Refill:  0  . promethazine-dextromethorphan (PROMETHAZINE-DM) 6.25-15 MG/5ML syrup    Sig: Take 5 mLs by mouth 4 (four) times daily as needed.    Dispense:  118 mL    Refill:  0  . predniSONE (DELTASONE) 10 MG tablet    Sig: 3 po qd x 3 days ,  2  po qd x 3 days then 1 po qd x 3 days    Dispense:  18 tablet    Refill:  0    Problem List Items Addressed This Visit    None    Visit Diagnoses    Acute bronchitis, unspecified organism    -  Primary   Cough       Relevant Medications   promethazine-dextromethorphan (PROMETHAZINE-DM) 6.25-15 MG/5ML syrup   predniSONE (DELTASONE) 10 MG tablet    finish z pak  rto prn  Follow-up: No Follow-up on file.  Ann Held, DO

## 2016-10-11 NOTE — Patient Instructions (Signed)

## 2016-10-11 NOTE — Progress Notes (Signed)
Pre visit review using our clinic review tool, if applicable. No additional management support is needed unless otherwise documented below in the visit note. 

## 2016-10-12 DIAGNOSIS — E049 Nontoxic goiter, unspecified: Secondary | ICD-10-CM | POA: Diagnosis not present

## 2016-10-12 DIAGNOSIS — E1165 Type 2 diabetes mellitus with hyperglycemia: Secondary | ICD-10-CM | POA: Diagnosis not present

## 2016-10-12 DIAGNOSIS — I10 Essential (primary) hypertension: Secondary | ICD-10-CM | POA: Diagnosis not present

## 2016-10-12 DIAGNOSIS — E7800 Pure hypercholesterolemia, unspecified: Secondary | ICD-10-CM | POA: Diagnosis not present

## 2016-10-23 ENCOUNTER — Encounter: Payer: Self-pay | Admitting: Physician Assistant

## 2016-10-23 ENCOUNTER — Ambulatory Visit: Payer: Self-pay | Admitting: Family Medicine

## 2016-10-23 ENCOUNTER — Ambulatory Visit (INDEPENDENT_AMBULATORY_CARE_PROVIDER_SITE_OTHER): Payer: Medicare HMO | Admitting: Physician Assistant

## 2016-10-23 DIAGNOSIS — H6983 Other specified disorders of Eustachian tube, bilateral: Secondary | ICD-10-CM | POA: Diagnosis not present

## 2016-10-23 DIAGNOSIS — J039 Acute tonsillitis, unspecified: Secondary | ICD-10-CM | POA: Diagnosis not present

## 2016-10-23 LAB — POCT RAPID STREP A (OFFICE): RAPID STREP A SCREEN: NEGATIVE

## 2016-10-23 MED ORDER — AMOXICILLIN 500 MG PO CAPS: 500.0000 mg | ORAL_CAPSULE | Freq: Two times a day (BID) | ORAL | 0 refills | Status: AC

## 2016-10-23 MED FILL — AMOXICILLIN 500 MG CAPSULE: 500 | 10 days supply | Qty: 20 | Fill #0

## 2016-10-23 NOTE — Progress Notes (Signed)
Pre visit review using our clinic tool,if applicable. No additional management support is needed unless otherwise documented below in the visit note.  

## 2016-10-23 NOTE — Progress Notes (Addendum)
Subjective:    Patient ID: [name], female    DOB: [birth year], 71 y.o.   MRN: [id number]  HPI  [name] is a 71 y/o female with dry cough, sore throat, no fever. No ear pain, but bothersome ears. Good appetite. No flu shot this year. No sick contacts. Last night she took hydrocodone to help with her sore throat because it was so painful. Chloraseptic spray without relief. Denies shortness of breath.  Blood sugar was 128 this morning.  Lab Results  Component Value Date   HGBA1C 11.1 (H) 11/14/2015   Recently treated for acute bronchitis by Dr. Carollee Herter. She states that she completed the medicine as directed.  Review of Systems  See HPI  Past Medical History:  Diagnosis Date  . Blockage of coronary artery of heart ([place])   . Depression   . Diabetes mellitus   . Hyperlipidemia   . Hypertension   . Pancreatitis   . Restless leg syndrome      Social History   Social History  . Marital status: Married    Spouse name: N/A  . Number of children: N/A  . Years of education: N/A   Occupational History  . Not on file.   Social History Main Topics  . Smoking status: Current Some Day Smoker    Packs/day: 0.20    Types: Cigarettes  . Smokeless tobacco: Never Used  . Alcohol use No  . Drug use: No  . Sexual activity: Not on file   Other Topics Concern  . Not on file   Social History Narrative   Exercise: seldom   Caffeine use: seldom    Past Surgical History:  Procedure Laterality Date  . CORONARY ANGIOPLASTY WITH STENT PLACEMENT    . FOOT SURGERY Right    cyst removal  . ROTATOR CUFF REPAIR     right  . TUBAL LIGATION      Family History  Problem Relation Age of Onset  . Heart attack Father   . Lung cancer    . Thyroid cancer    . Uterine cancer    . Breast cancer Sister   . Cancer Other     lung,thyroid,breast,uterine  . Heart attack Sister   . Heart attack Brother 47    Allergies  Allergen Reactions  . Levemir [Insulin Detemir]  Hives and Swelling  . Metformin And Related Other (See Comments)    Sick on stomach, sweaty, throw up, pain    Current Outpatient Prescriptions on File Prior to Visit  Medication Sig Dispense Refill  . alendronate (FOSAMAX) 70 MG tablet Take 1 tablet (70 mg total) by mouth once a week. Take with full glass of water on empty stomach. 4 tablet 12  . ALPRAZolam (XANAX) 0.25 MG tablet Take 1 tablet by mouth three times a day if needed 60 tablet 0  . amLODipine (NORVASC) 10 MG tablet take 1 tablet by mouth once daily 30 tablet 5  . aspirin EC 81 MG tablet Take 81 mg by mouth daily.    [name] atorvastatin (LIPITOR) 40 MG tablet take 1 tablet by mouth once daily 90 tablet 1  . Blood Glucose Calibration (TAI DOC CONTROL) NORMAL SOLN     . Blood Glucose Monitoring Suppl (ACCU-CHEK AVIVA PLUS) w/Device KIT Check blood sugar daily. E11.9 1 kit 0  . Carboxymethylcellulose Sodium (THERATEARS OP) Place 1 drop into both eyes daily as needed (dry eyes).    . Cyanocobalamin (VITAMIN B-12 PO) Take 1 tablet  by mouth daily.    . ferrous sulfate 325 (65 FE) MG tablet Take 325 mg by mouth daily with breakfast.    . Flaxseed, Linseed, (FLAXSEED OIL PO) Take 1 capsule by mouth 2 (two) times daily.     [name] FLUoxetine (PROZAC) 20 MG capsule take 1 capsule by mouth once daily 30 capsule 5  . fluticasone (FLONASE) 50 MCG/ACT nasal spray Place 2 sprays into both nostrils daily. 16 g 6  . gabapentin (NEURONTIN) 100 MG capsule take 1 capsule by mouth three times a day  0  . gabapentin (NEURONTIN) 300 MG capsule Take 1 capsule by mouth at night for 7 days and then one by mouth twice per day. He is tolerating may take up to 3 times per day. 180 capsule 1  . GINKGO BILOBA COMPLEX PO Take 1 tablet by mouth daily.     [name] glucose blood (ACCU-CHEK AVIVA) test strip Check blood sugar four times daily. E11.9 400 each 3  . insulin regular (NOVOLIN R RELION) 100 units/mL injection Inject 0.3 mLs (30 Units total) into the skin daily as needed  for high blood sugar (at noon if CBG >200 and will be eating). 10 mL   . Insulin Syringe-Needle U-100 (BD INSULIN SYRINGE ULTRAFINE) 31G X 5/16" 0.3 ML MISC 10 Units by Does not apply route 3 (three) times daily before meals.      Elmore Guise Devices (LANCING DEVICE) MISC     . levocetirizine (XYZAL) 5 MG tablet Take 1 tablet (5 mg total) by mouth every evening. 30 tablet 5  . lisinopril (PRINIVIL,ZESTRIL) 10 MG tablet Take 1 tablet (10 mg total) by mouth daily. 90 tablet 3  . metoprolol succinate (TOPROL-XL) 50 MG 24 hr tablet take 1 tablet by mouth once daily with food 90 tablet 1  . Multiple Vitamin (MULTIVITAMIN WITH MINERALS) TABS Take 1 tablet by mouth daily.    [name] NOVOLIN N RELION 100 UNIT/ML injection Inject 30-60 Units into the skin 2 (two) times daily. Inject 30 units subcutaneously before breakfast and 60 units at bedtime    . PHARMACIST CHOICE LANCETS MISC     . vitamin E 400 UNIT capsule Take 400 Units by mouth daily.     No current facility-administered medications on file prior to visit.     There were no vitals taken for this visit.      Objective:   Physical Exam  Constitutional: She appears well-developed and well-nourished.  HENT:  Head: Normocephalic and atraumatic.  Right Ear: Tympanic membrane, external ear and ear canal normal. Tympanic membrane is not erythematous, not retracted and not bulging.  Left Ear: External ear and ear canal normal. Tympanic membrane is not erythematous, not retracted and not bulging.  Mouth/Throat: Oropharyngeal exudate, posterior oropharyngeal edema and posterior oropharyngeal erythema present.  Tonsils 1+ bilaterally  Cardiovascular: Normal rate, regular rhythm and normal heart sounds.   Pulmonary/Chest: Effort normal and breath sounds normal. No accessory muscle usage. No respiratory distress. She has no decreased breath sounds. She has no wheezes. She has no rhonchi. She has no rales.  Lymphadenopathy:    She has no cervical adenopathy.    Nursing note and vitals reviewed.  POCT rapid strep A negative    Assessment & Plan:  1. Exudative tonsillitis  Given appearance and symptoms, as well as very poor DM control (last HgbA1c is 11.1) will empirically treat with Amoxicillin.  2. Dysfunction of both eustachian tubes Discussed resumption of regular use of Flonase.  Advised patient  to return to clinic with any worsening signs or symptoms.  Inda Coke PA-C 10/23/16

## 2016-10-23 NOTE — Patient Instructions (Signed)
It was great meeting you today!  Take the antibiotic as prescribed. If you have any worsening pain, fevers, or other symptoms please call us and let us know!

## 2016-11-05 ENCOUNTER — Other Ambulatory Visit: Payer: Self-pay | Admitting: Family Medicine

## 2016-11-23 DIAGNOSIS — I10 Essential (primary) hypertension: Secondary | ICD-10-CM | POA: Diagnosis not present

## 2016-11-23 DIAGNOSIS — E049 Nontoxic goiter, unspecified: Secondary | ICD-10-CM | POA: Diagnosis not present

## 2016-11-23 DIAGNOSIS — E1165 Type 2 diabetes mellitus with hyperglycemia: Secondary | ICD-10-CM | POA: Diagnosis not present

## 2016-11-23 DIAGNOSIS — E7800 Pure hypercholesterolemia, unspecified: Secondary | ICD-10-CM | POA: Diagnosis not present

## 2016-11-26 ENCOUNTER — Encounter: Payer: Self-pay | Admitting: Family Medicine

## 2016-11-26 ENCOUNTER — Ambulatory Visit (INDEPENDENT_AMBULATORY_CARE_PROVIDER_SITE_OTHER): Payer: Medicare HMO | Admitting: Family Medicine

## 2016-11-26 VITALS — BP 136/78 | HR 54 | Temp 97.7°F | Resp 16 | Ht 65.0 in | Wt 176.8 lb

## 2016-11-26 DIAGNOSIS — B029 Zoster without complications: Secondary | ICD-10-CM

## 2016-11-26 MED ORDER — VALACYCLOVIR HCL 1 G PO TABS: 1000.0000 mg | ORAL_TABLET | Freq: Three times a day (TID) | ORAL | 0 refills | Status: DC

## 2016-11-26 MED FILL — valACYclovir HCL 1 GM TABS: 1 | 10 days supply | Qty: 30 | Fill #0

## 2016-11-26 NOTE — Progress Notes (Signed)
Pre visit review using our clinic review tool, if applicable. No additional management support is needed unless otherwise documented below in the visit note. 

## 2016-11-26 NOTE — Progress Notes (Signed)
Subjective:  I acted as a Education administrator for Dr. Carollee Herter.  [name], [name]   Patient ID: [name], female    DOB: 09-Feb-1946, 71 y.o.   MRN: [id number]  Chief Complaint  Patient presents with  . Rash    left side of neck going down to breast.  started itching saturday night and notificed yesterday.    HPI Patient is in today for rash.  Starting itching Saturday night and noticed rash yesterday on left side of neck going down to breast.  Past Medical History:  Diagnosis Date  . Blockage of coronary artery of heart ([place])   . Depression   . Diabetes mellitus   . Hyperlipidemia   . Hypertension   . Pancreatitis   . Restless leg syndrome     Past Surgical History:  Procedure Laterality Date  . CORONARY ANGIOPLASTY WITH STENT PLACEMENT    . FOOT SURGERY Right    cyst removal  . ROTATOR CUFF REPAIR     right  . TUBAL LIGATION      Family History  Problem Relation Age of Onset  . Heart attack Father   . Lung cancer    . Thyroid cancer    . Uterine cancer    . Breast cancer Sister   . Cancer Other     lung,thyroid,breast,uterine  . Heart attack Sister   . Heart attack Brother 58    Social History   Social History  . Marital status: Married    Spouse name: N/A  . Number of children: N/A  . Years of education: N/A   Occupational History  . Not on file.   Social History Main Topics  . Smoking status: Current Some Day Smoker    Packs/day: 0.20    Types: Cigarettes  . Smokeless tobacco: Never Used  . Alcohol use No  . Drug use: No  . Sexual activity: Not on file   Other Topics Concern  . Not on file   Social History Narrative   Exercise: seldom   Caffeine use: seldom    Outpatient Medications Prior to Visit  Medication Sig Dispense Refill  . alendronate (FOSAMAX) 70 MG tablet Take 1 tablet (70 mg total) by mouth once a week. Take with full glass of water on empty stomach. 4 tablet 12  . ALPRAZolam (XANAX) 0.25 MG tablet Take 1 tablet by mouth three  times a day if needed 60 tablet 0  . amLODipine (NORVASC) 10 MG tablet take 1 tablet by mouth once daily 30 tablet 5  . aspirin EC 81 MG tablet Take 81 mg by mouth daily.    [name] atorvastatin (LIPITOR) 40 MG tablet take 1 tablet by mouth once daily 90 tablet 1  . Blood Glucose Calibration (TAI DOC CONTROL) NORMAL SOLN     . Blood Glucose Monitoring Suppl (ACCU-CHEK AVIVA PLUS) w/Device KIT Check blood sugar daily. E11.9 1 kit 0  . Carboxymethylcellulose Sodium (THERATEARS OP) Place 1 drop into both eyes daily as needed (dry eyes).    . Cyanocobalamin (VITAMIN B-12 PO) Take 1 tablet by mouth daily.    . ferrous sulfate 325 (65 FE) MG tablet Take 325 mg by mouth daily with breakfast.    . Flaxseed, Linseed, (FLAXSEED OIL PO) Take 1 capsule by mouth 2 (two) times daily.     [name] FLUoxetine (PROZAC) 20 MG capsule take 1 capsule by mouth once daily 30 capsule 5  . fluticasone (FLONASE) 50 MCG/ACT nasal spray Place 2 sprays into both  nostrils daily. 16 g 6  . gabapentin (NEURONTIN) 100 MG capsule take 1 capsule by mouth three times a day  0  . gabapentin (NEURONTIN) 300 MG capsule Take 1 capsule by mouth at night for 7 days and then one by mouth twice per day. He is tolerating may take up to 3 times per day. 180 capsule 1  . GINKGO BILOBA COMPLEX PO Take 1 tablet by mouth daily.     [name] glucose blood (ACCU-CHEK AVIVA) test strip Check blood sugar four times daily. E11.9 400 each 3  . insulin regular (NOVOLIN R RELION) 100 units/mL injection Inject 0.3 mLs (30 Units total) into the skin daily as needed for high blood sugar (at noon if CBG >200 and will be eating). 10 mL   . Insulin Syringe-Needle U-100 (BD INSULIN SYRINGE ULTRAFINE) 31G X 5/16" 0.3 ML MISC 10 Units by Does not apply route 3 (three) times daily before meals.      Elmore Guise Devices (LANCING DEVICE) MISC     . levocetirizine (XYZAL) 5 MG tablet Take 1 tablet (5 mg total) by mouth every evening. 30 tablet 5  . lisinopril (PRINIVIL,ZESTRIL) 10 MG  tablet Take 1 tablet (10 mg total) by mouth daily. 90 tablet 3  . metoprolol succinate (TOPROL-XL) 50 MG 24 hr tablet take 1 tablet by mouth once daily with food 90 tablet 1  . Multiple Vitamin (MULTIVITAMIN WITH MINERALS) TABS Take 1 tablet by mouth daily.    [name] NOVOLIN N RELION 100 UNIT/ML injection Inject 30-60 Units into the skin 2 (two) times daily. Inject 30 units subcutaneously before breakfast and 60 units at bedtime    . PHARMACIST CHOICE LANCETS MISC     . vitamin E 400 UNIT capsule Take 400 Units by mouth daily.     No facility-administered medications prior to visit.     Allergies  Allergen Reactions  . Levemir [Insulin Detemir] Hives and Swelling  . Metformin And Related Other (See Comments)    Sick on stomach, sweaty, throw up, pain    Review of Systems  Constitutional: Negative for fever and malaise/fatigue.  HENT: Negative for congestion.   Eyes: Negative for blurred vision.  Respiratory: Negative for cough and shortness of breath.   Cardiovascular: Negative for chest pain, palpitations and leg swelling.  Gastrointestinal: Negative for vomiting.  Musculoskeletal: Negative for back pain.  Skin: Positive for rash.       Left side of neck going down to left breast.  Neurological: Negative for loss of consciousness and headaches.       Objective:    Physical Exam  Constitutional: She is oriented to person, place, and time. She appears well-developed and well-nourished. No distress.  HENT:  Head: Normocephalic and atraumatic.  Eyes: Conjunctivae are normal.  Neck: Normal range of motion. No thyromegaly present.  Cardiovascular: Normal rate and regular rhythm.   Pulmonary/Chest: Effort normal and breath sounds normal. She has no wheezes.  Abdominal: Soft. Bowel sounds are normal. There is no tenderness.  Musculoskeletal: Normal range of motion. She exhibits no edema or deformity.  Neurological: She is alert and oriented to person, place, and time.  Skin: Skin is  warm and dry. She is not diaphoretic.  Psychiatric: She has a normal mood and affect.    BP 136/78 (BP Location: Left Arm, Cuff Size: Normal)   Pulse (!) 54   Temp 97.7 F (36.5 C) (Oral)   Resp 16   Ht _0  (1.651 m)   Wt  176 lb 12.8 oz (80.2 kg)   SpO2 95%   BMI 29.42 kg/m  Wt Readings from Last 3 Encounters:  11/26/16 176 lb 12.8 oz (80.2 kg)  10/11/16 178 lb 12.8 oz (81.1 kg)  09/12/16 175 lb 9.6 oz (79.7 kg)     Lab Results  Component Value Date   WBC 7.6 02/19/2016   HGB 11.8 (L) 02/19/2016   HCT 35.6 (L) 02/19/2016   PLT 155 02/19/2016   GLUCOSE 226 (H) 02/18/2016   CHOL 128 02/18/2016   TRIG 61 02/18/2016   HDL 36 (L) 02/18/2016   LDLDIRECT 59.0 11/14/2015   LDLCALC 80 02/18/2016   ALT 24 02/18/2016   AST 19 02/18/2016   NA 136 02/18/2016   K 3.7 02/18/2016   CL 102 02/18/2016   CREATININE 0.68 02/18/2016   BUN 14 02/18/2016   CO2 26 02/18/2016   TSH 0.68 06/02/2015   INR 1.1 03/03/2009   HGBA1C 11.1 (H) 11/14/2015   MICROALBUR 1.7 06/23/2015    Lab Results  Component Value Date   TSH 0.68 06/02/2015   Lab Results  Component Value Date   WBC 7.6 02/19/2016   HGB 11.8 (L) 02/19/2016   HCT 35.6 (L) 02/19/2016   MCV 88.3 02/19/2016   PLT 155 02/19/2016   Lab Results  Component Value Date   NA 136 02/18/2016   K 3.7 02/18/2016   CO2 26 02/18/2016   GLUCOSE 226 (H) 02/18/2016   BUN 14 02/18/2016   CREATININE 0.68 02/18/2016   BILITOT 1.2 02/18/2016   ALKPHOS 50 02/18/2016   AST 19 02/18/2016   ALT 24 02/18/2016   PROT 6.6 02/18/2016   ALBUMIN 3.7 02/18/2016   CALCIUM 9.6 02/18/2016   ANIONGAP 8 02/18/2016   GFR 95.32 12/29/2015   Lab Results  Component Value Date   CHOL 128 02/18/2016   Lab Results  Component Value Date   HDL 36 (L) 02/18/2016   Lab Results  Component Value Date   LDLCALC 80 02/18/2016   Lab Results  Component Value Date   TRIG 61 02/18/2016   Lab Results  Component Value Date   CHOLHDL 3.6 02/18/2016     Lab Results  Component Value Date   HGBA1C 11.1 (H) 11/14/2015       Assessment & Plan:   Problem List Items Addressed This Visit    None    Visit Diagnoses    Herpes zoster without complication    -  Primary   Relevant Medications   valACYclovir (VALTREX) 1000 MG tablet      I am having Ms. Madill start on valACYclovir. I am also having her maintain her Insulin Syringe-Needle U-100, NOVOLIN N RELION, multivitamin with minerals, Lancing Device, TAI DOC CONTROL, aspirin EC, PHARMACIST CHOICE LANCETS, glucose blood, (Flaxseed, Linseed, (FLAXSEED OIL PO)), GINKGO BILOBA COMPLEX PO, FLUoxetine, levocetirizine, fluticasone, ACCU-CHEK AVIVA PLUS, ferrous sulfate, Cyanocobalamin (VITAMIN B-12 PO), vitamin E, Carboxymethylcellulose Sodium (THERATEARS OP), insulin regular, lisinopril, metoprolol succinate, alendronate, ALPRAZolam, atorvastatin, gabapentin, gabapentin, and amLODipine.  Meds ordered this encounter  Medications  . valACYclovir (VALTREX) 1000 MG tablet    Sig: Take 1 tablet (1,000 mg total) by mouth 3 (three) times daily.    Dispense:  30 tablet    Refill:  0    CMA served as scribe during this visit. History, Physical and Plan performed by medical provider. Documentation and orders reviewed and attested to.  Ann Held, DO

## 2016-11-26 NOTE — Patient Instructions (Signed)
Take Valtrex 3 times a day for 10 days.  Can stop if it clears up after 7 days.   Shingles Shingles, which is also known as herpes zoster, is an infection that causes a painful skin rash and fluid-filled blisters. Shingles is not related to genital herpes, which is a sexually transmitted infection. Shingles only develops in people who:  Have had chickenpox.  Have received the chickenpox vaccine. (This is rare.) What are the causes? Shingles is caused by varicella-zoster virus (VZV). This is the same virus that causes chickenpox. After exposure to VZV, the virus stays in the body in an inactive (dormant) state. Shingles develops if the virus reactivates. This can happen many years after the initial exposure to VZV. It is not known what causes this virus to reactivate. What increases the risk? People who have had chickenpox or received the chickenpox vaccine are at risk for shingles. Infection is more common in people who:  Are older than age 71.  Have a weakened defense (immune) system, such as those with HIV, AIDS, or cancer.  Are taking medicines that weaken the immune system, such as transplant medicines.  Are under great stress. What are the signs or symptoms? Early symptoms of this condition include itching, tingling, and pain in an area on your skin. Pain may be described as burning, stabbing, or throbbing. A few days or weeks after symptoms start, a painful red rash appears, usually on one side of the body in a bandlike or beltlike pattern. The rash eventually turns into fluid-filled blisters that break open, scab over, and dry up in about 2-3 weeks. At any time during the infection, you may also develop:  A fever.  Chills.  A headache.  An upset stomach. How is this diagnosed? This condition is diagnosed with a skin exam. Sometimes, skin or fluid samples are taken from the blisters before a diagnosis is made. These samples are examined under a microscope or sent to a lab for  testing. How is this treated? There is no specific cure for this condition. Your health care provider will probably prescribe medicines to help you manage pain, recover more quickly, and avoid long-term problems. Medicines may include:  Antiviral drugs.  Anti-inflammatory drugs.  Pain medicines. If the area involved is on your face, you may be referred to a specialist, such as an eye doctor (ophthalmologist) or an ear, nose, and throat (ENT) doctor to help you avoid eye problems, chronic pain, or disability. Follow these instructions at home: Medicines  Take medicines only as directed by your health care provider.  Apply an anti-itch or numbing cream to the affected area as directed by your health care provider. Blister and Rash Care  Take a cool bath or apply cool compresses to the area of the rash or blisters as directed by your health care provider. This may help with pain and itching.  Keep your rash covered with a loose bandage (dressing). Wear loose-fitting clothing to help ease the pain of material rubbing against the rash.  Keep your rash and blisters clean with mild soap and cool water or as directed by your health care provider.  Check your rash every day for signs of infection. These include redness, swelling, and pain that lasts or increases.  Do not pick your blisters.  Do not scratch your rash. General instructions  Rest as directed by your health care provider.  Keep all follow-up visits as directed by your health care provider. This is important.  Until your blisters  scab over, your infection can cause chickenpox in people who have never had it or been vaccinated against it. To prevent this from happening, avoid contact with other people, especially:  Babies.  Pregnant women.  Children who have eczema.  Elderly people who have transplants.  People who have chronic illnesses, such as leukemia or AIDS. Contact a health care provider if:  Your pain is not  relieved with prescribed medicines.  Your pain does not get better after the rash heals.  Your rash looks infected. Signs of infection include redness, swelling, and pain that lasts or increases. Get help right away if:  The rash is on your face or nose.  You have facial pain, pain around your eye area, or loss of feeling on one side of your face.  You have ear pain or you have ringing in your ear.  You have loss of taste.  Your condition gets worse. This information is not intended to replace advice given to you by your health care provider. Make sure you discuss any questions you have with your health care provider. Document Released: 09/24/2005 Document Revised: 05/20/2016 Document Reviewed: 08/05/2014 Elsevier Interactive Patient Education  2017 [employer].

## 2016-11-28 ENCOUNTER — Other Ambulatory Visit: Payer: Self-pay | Admitting: Family Medicine

## 2016-11-28 DIAGNOSIS — J302 Other seasonal allergic rhinitis: Secondary | ICD-10-CM

## 2016-11-28 DIAGNOSIS — M792 Neuralgia and neuritis, unspecified: Secondary | ICD-10-CM

## 2016-11-28 NOTE — Telephone Encounter (Signed)
Please advise. TL/CMA 

## 2016-12-21 NOTE — Progress Notes (Deleted)
Subjective:   [name] is a 71 y.o. female who presents for an Initial Medicare Annual Wellness Visit.  Review of Systems    No ROS.  Medicare Wellness Visit.     Sleep patterns: {SX; SLEEP PATTERNS:18802::"feels rested on waking","does not get up to void","gets up *** times nightly to void","sleeps *** hours nightly"}.   Home Safety/Smoke Alarms:   Living environment; residence and Firearm Safety: {Rehab home environment / accessibility:30080::"no firearms","firearms stored safely"}. Seat Belt Safety/Bike Helmet: Wears seat belt.   Counseling:   Eye Exam-  Dental-  Female:   Pap- Aged out       Mammo- last 12/17/13. BI-RADS CATEGORY  1: Negative.        Dexa scan- last 12/17/13. Osteoporosis.      CCS- last 04/23/07. Diverticulosis, otherwise normal. 10 year recall.      Objective:    There were no vitals filed for this visit. There is no height or weight on file to calculate BMI.   Current Medications (verified) Outpatient Encounter Prescriptions as of 12/24/2016  Medication Sig  . alendronate (FOSAMAX) 70 MG tablet Take 1 tablet (70 mg total) by mouth once a week. Take with full glass of water on empty stomach.  . ALPRAZolam (XANAX) 0.25 MG tablet Take 1 tablet by mouth three times a day if needed  . amLODipine (NORVASC) 10 MG tablet take 1 tablet by mouth once daily  . aspirin EC 81 MG tablet Take 81 mg by mouth daily.  [name] atorvastatin (LIPITOR) 40 MG tablet take 1 tablet by mouth once daily  . Blood Glucose Calibration (TAI DOC CONTROL) NORMAL SOLN   . Blood Glucose Monitoring Suppl (ACCU-CHEK AVIVA PLUS) w/Device KIT Check blood sugar daily. E11.9  . Carboxymethylcellulose Sodium (THERATEARS OP) Place 1 drop into both eyes daily as needed (dry eyes).  . Cyanocobalamin (VITAMIN B-12 PO) Take 1 tablet by mouth daily.  . ferrous sulfate 325 (65 FE) MG tablet Take 325 mg by mouth daily with breakfast.  . Flaxseed, Linseed, (FLAXSEED OIL PO) Take 1 capsule by mouth  2 (two) times daily.   [name] FLUoxetine (PROZAC) 20 MG capsule take 1 capsule by mouth once daily  . fluticasone (FLONASE) 50 MCG/ACT nasal spray Place 2 sprays into both nostrils daily.  [name] gabapentin (NEURONTIN) 100 MG capsule take 1 capsule by mouth three times a day  . gabapentin (NEURONTIN) 100 MG capsule take 1 capsule by mouth three times a day  . gabapentin (NEURONTIN) 300 MG capsule Take 1 capsule by mouth at night for 7 days and then one by mouth twice per day. He is tolerating may take up to 3 times per day.  [name] GINKGO BILOBA COMPLEX PO Take 1 tablet by mouth daily.   [name] glucose blood (ACCU-CHEK AVIVA) test strip Check blood sugar four times daily. E11.9  . insulin regular (NOVOLIN R RELION) 100 units/mL injection Inject 0.3 mLs (30 Units total) into the skin daily as needed for high blood sugar (at noon if CBG >200 and will be eating).  . Insulin Syringe-Needle U-100 (BD INSULIN SYRINGE ULTRAFINE) 31G X 5/16" 0.3 ML MISC 10 Units by Does not apply route 3 (three) times daily before meals.    Elmore Guise Devices (LANCING DEVICE) MISC   . levocetirizine (XYZAL) 5 MG tablet take 1 tablet by mouth every evening  . lisinopril (PRINIVIL,ZESTRIL) 10 MG tablet Take 1 tablet (10 mg total) by mouth daily.  . metoprolol succinate (TOPROL-XL) 50 MG 24 hr  tablet take 1 tablet by mouth once daily with food  . Multiple Vitamin (MULTIVITAMIN WITH MINERALS) TABS Take 1 tablet by mouth daily.  [name] NOVOLIN N RELION 100 UNIT/ML injection Inject 30-60 Units into the skin 2 (two) times daily. Inject 30 units subcutaneously before breakfast and 60 units at bedtime  . PHARMACIST CHOICE LANCETS MISC   . valACYclovir (VALTREX) 1000 MG tablet Take 1 tablet (1,000 mg total) by mouth 3 (three) times daily.  . vitamin E 400 UNIT capsule Take 400 Units by mouth daily.   No facility-administered encounter medications on file as of 12/24/2016.     Allergies (verified) Levemir [insulin detemir] and Metformin and related    History: Past Medical History:  Diagnosis Date  . 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 Depression   . Diabetes mellitus   . Hyperlipidemia   . Hypertension   . Pancreatitis   . Restless leg syndrome    Past Surgical History:  Procedure Laterality Date  . CORONARY ANGIOPLASTY WITH STENT PLACEMENT    . FOOT SURGERY Right    cyst removal  . ROTATOR CUFF REPAIR     right  . TUBAL LIGATION     Family History  Problem Relation Age of Onset  . Heart attack Father   . Lung cancer    . Thyroid cancer    . Uterine cancer    . Breast cancer Sister   . Cancer Other     lung,thyroid,breast,uterine  . Heart attack Sister   . Heart attack Brother 64   Social History   Occupational History  . Not on file.   Social History Main Topics  . Smoking status: Current Some Day Smoker    Packs/day: 0.20    Types: Cigarettes  . Smokeless tobacco: Never Used  . Alcohol use No  . Drug use: No  . Sexual activity: Not on file    Tobacco Counseling Ready to quit: Not Answered Counseling given: Not Answered   Activities of Daily Living No flowsheet data found.  Immunizations and Health Maintenance Immunization History  Administered Date(s) Administered  . Influenza Split 08/08/2012  . Influenza, High Dose Seasonal PF 08/09/2014, 07/05/2015  . PPD Test 08/27/2011, 09/04/2011, 07/13/2014, 09/23/2015  . Pneumococcal Conjugate-13 08/09/2014  . Pneumococcal Polysaccharide-23 11/17/2013  . Td 03/11/2007  . Zoster 03/11/2007   Health Maintenance Due  Topic Date Due  . OPHTHALMOLOGY EXAM  11/24/2015  . MAMMOGRAM  12/18/2015  . INFLUENZA VACCINE  05/08/2016  . HEMOGLOBIN A1C  05/13/2016    Patient Care Team: Ann Held, DO as PCP - General  Indicate any recent Medical Services you may have received from other than [facility] providers in the past year (date may be approximate).     Assessment:   This is a routine wellness examination for [name]. Physical  assessment deferred to PCP.  Hearing/Vision screen No exam data present  Dietary issues and exercise activities discussed:    Diet (meal preparation, eat out, water intake, caffeinated beverages, dairy products, fruits and vegetables): {Desc; diets:16563} Breakfast: Lunch:  Dinner:      Goals    None     Depression Screen PHQ 2/9 Scores 10/11/2016 11/14/2015 11/29/2014 11/17/2013  PHQ - 2 Score 0 0 0 0    Fall Risk Fall Risk  10/11/2016 11/14/2015 11/29/2014 11/17/2013  Falls in the past year? No No No No    Cognitive Function:        Screening Tests Health  Maintenance  Topic Date Due  . OPHTHALMOLOGY EXAM  11/24/2015  . MAMMOGRAM  12/18/2015  . INFLUENZA VACCINE  05/08/2016  . HEMOGLOBIN A1C  05/13/2016  . FOOT EXAM  03/01/2017  . TETANUS/TDAP  03/10/2017  . COLONOSCOPY  04/22/2017  . DEXA SCAN  Completed  . Hepatitis C Screening  Completed  . PNA vac Low Risk Adult  Completed      Plan:    Follow-up w/ PCP as directed.  During the course of the visit, Mikah was educated and counseled about the following appropriate screening and preventive services:   Vaccines to include Pneumoccal, Influenza, Hepatitis B, Td, HCV  Cardiovascular disease screening  Colorectal cancer screening  Bone density screening  Diabetes screening  Glaucoma screening  Mammography/PAP  Nutrition counseling  Patient Instructions (the written plan) were given to the patient.    Dorrene German, RN   12/21/2016

## 2016-12-21 NOTE — Progress Notes (Deleted)
Pre visit review using our clinic review tool, if applicable. No additional management support is needed unless otherwise documented below in the visit note. 

## 2016-12-24 ENCOUNTER — Ambulatory Visit: Payer: Self-pay | Admitting: *Deleted

## 2016-12-24 ENCOUNTER — Encounter: Payer: Self-pay | Admitting: Family Medicine

## 2017-01-15 ENCOUNTER — Other Ambulatory Visit: Payer: Self-pay | Admitting: Family Medicine

## 2017-01-17 ENCOUNTER — Other Ambulatory Visit: Payer: Self-pay | Admitting: Family Medicine

## 2017-01-17 DIAGNOSIS — M792 Neuralgia and neuritis, unspecified: Secondary | ICD-10-CM

## 2017-04-01 ENCOUNTER — Ambulatory Visit (INDEPENDENT_AMBULATORY_CARE_PROVIDER_SITE_OTHER): Payer: Medicare HMO | Admitting: *Deleted

## 2017-04-01 ENCOUNTER — Encounter: Payer: Self-pay | Admitting: *Deleted

## 2017-04-01 VITALS — BP 138/80 | HR 60 | Ht 65.0 in | Wt 166.6 lb

## 2017-04-01 DIAGNOSIS — Z1231 Encounter for screening mammogram for malignant neoplasm of breast: Secondary | ICD-10-CM

## 2017-04-01 DIAGNOSIS — M810 Age-related osteoporosis without current pathological fracture: Secondary | ICD-10-CM | POA: Diagnosis not present

## 2017-04-01 DIAGNOSIS — Z1239 Encounter for other screening for malignant neoplasm of breast: Secondary | ICD-10-CM

## 2017-04-01 DIAGNOSIS — Z0000 Encounter for general adult medical examination without abnormal findings: Secondary | ICD-10-CM | POA: Diagnosis not present

## 2017-04-01 NOTE — Addendum Note (Signed)
Addended by: Starla LinkAKLEY, Farhiya Rosten J on: 04/01/2017 02:34 PM   Modules accepted: Orders

## 2017-04-01 NOTE — Patient Instructions (Addendum)
[name] , Thank you for taking time to come for your Medicare Wellness Visit. I appreciate your ongoing commitment to your health goals. Please review the following plan we discussed and let me know if I can assist you in the future.   Wear your seat belt.  Schedule a routine diabetic eye exam.   Your Cologuard (colon cancer screening) kit will be mailed to your home.  These are the goals we discussed: Goals    . Increase physical activity          Start Silver Sneakers.    . Quit smoking / using tobacco       This is a list of the screening recommended for you and due dates:  Health Maintenance  Topic Date Due  . Eye exam for diabetics  11/24/2015  . Mammogram  12/18/2015  . Hemoglobin A1C  05/13/2016  . Tetanus Vaccine  03/10/2017  . Colon Cancer Screening  04/22/2017  . Flu Shot  05/08/2017  . Complete foot exam   04/01/2018  . DEXA scan (bone density measurement)  Completed  .  Hepatitis C: One time screening is recommended by Center for Disease Control  (CDC) for  adults born from [birth year] through 1965.   Completed  . Pneumonia vaccines  Completed    Steps to Quit Smoking Smoking tobacco can be bad for your health. It can also affect almost every organ in your body. Smoking puts you and people around you at risk for many serious long-lasting (chronic) diseases. Quitting smoking is hard, but it is one of the best things that you can do for your health. It is never too late to quit. What are the benefits of quitting smoking? When you quit smoking, you lower your risk for getting serious diseases and conditions. They can include:  Lung cancer or lung disease.  Heart disease.  Stroke.  Heart attack.  Not being able to have children (infertility).  Weak bones (osteoporosis) and broken bones (fractures).  If you have coughing, wheezing, and shortness of breath, those symptoms may get better when you quit. You may also get sick less often. If you are pregnant,  quitting smoking can help to lower your chances of having a baby of low birth weight. What can I do to help me quit smoking? Talk with your doctor about what can help you quit smoking. Some things you can do (strategies) include:  Quitting smoking totally, instead of slowly cutting back how much you smoke over a period of time.  Going to in-person counseling. You are more likely to quit if you go to many counseling sessions.  Using resources and support systems, such as: ? Database administrator with a Social worker. ? Phone quitlines. ? Careers information officer. ? Support groups or group counseling. ? Text messaging programs. ? Mobile phone apps or applications.  Taking medicines. Some of these medicines may have nicotine in them. If you are pregnant or breastfeeding, do not take any medicines to quit smoking unless your doctor says it is okay. Talk with your doctor about counseling or other things that can help you.  Talk with your doctor about using more than one strategy at the same time, such as taking medicines while you are also going to in-person counseling. This can help make quitting easier. What things can I do to make it easier to quit? Quitting smoking might feel very hard at first, but there is a lot that you can do to make it easier. Take these  steps:  Talk to your family and friends. Ask them to support and encourage you.  Call phone quitlines, reach out to support groups, or work with a Social worker.  Ask people who smoke to not smoke around you.  Avoid places that make you want (trigger) to smoke, such as: ? Bars. ? Parties. ? Smoke-break areas at work.  Spend time with people who do not smoke.  Lower the stress in your life. Stress can make you want to smoke. Try these things to help your stress: ? Getting regular exercise. ? Deep-breathing exercises. ? Yoga. ? Meditating. ? Doing a body scan. To do this, close your eyes, focus on one area of your body at a time from head  to toe, and notice which parts of your body are tense. Try to relax the muscles in those areas.  Download or buy apps on your mobile phone or tablet that can help you stick to your quit plan. There are many free apps, such as QuitGuide from the [employer] Office manager for Disease Control and Prevention). You can find more support from smokefree.gov and other websites.  This information is not intended to replace advice given to you by your health care provider. Make sure you discuss any questions you have with your health care provider. Document Released: 07/21/2009 Document Revised: 05/22/2016 Document Reviewed: 02/08/2015 Elsevier Interactive Patient Education  2018 [place] Directive Advance directives are legal documents that let you make choices ahead of time about your health care and medical treatment in case you become unable to communicate for yourself. Advance directives are a way for you to communicate your wishes to family, friends, and health care providers. This can help convey your decisions about end-of-life care if you become unable to communicate. Discussing and writing advance directives should happen over time rather than all at once. Advance directives can be changed depending on your situation and what you want, even after you have signed the advance directives. If you do not have an advance directive, some states assign family decision makers to act on your behalf based on how closely you are related to them. Each state has its own laws regarding advance directives. You may want to check with your health care provider, attorney, or state representative about the laws in your state. There are different types of advance directives, such as:  Medical power of attorney.  Living will.  Do not resuscitate (DNR) or do not attempt resuscitation (DNAR) order.  Health care proxy and medical power of attorney A health care proxy, also called a health care agent, is a person who is  appointed to make medical decisions for you in cases in which you are unable to make the decisions yourself. Generally, people choose someone they know well and trust to represent their preferences. Make sure to ask this person for an agreement to act as your proxy. A proxy may have to exercise judgment in the event of a medical decision for which your wishes are not known. A medical power of attorney is a legal document that names your health care proxy. Depending on the laws in your state, after the document is written, it may also need to be:  Signed.  Notarized.  Dated.  Copied.  Witnessed.  Incorporated into your medical record.  You may also want to appoint someone to manage your financial affairs in a situation in which you are unable to do so. This is called a durable power of attorney for finances. It  is a separate legal document from the durable power of attorney for health care. You may choose the same person or someone different from your health care proxy to act as your agent in financial matters. If you do not appoint a proxy, or if there is a concern that the proxy is not acting in your best interests, a court-appointed guardian may be designated to act on your behalf. Living will A living will is a set of instructions documenting your wishes about medical care when you cannot express them yourself. Health care providers should keep a copy of your living will in your medical record. You may want to give a copy to family members or friends. To alert caregivers in case of an emergency, you can place a card in your wallet to let them know that you have a living will and where they can find it. A living will is used if you become:  Terminally ill.  Incapacitated.  Unable to communicate or make decisions.  Items to consider in your living will include:  The use or non-use of life-sustaining equipment, such as dialysis machines and breathing machines (ventilators).  A DNR or  DNAR order, which is the instruction not to use cardiopulmonary resuscitation (CPR) if breathing or heartbeat stops.  The use or non-use of tube feeding.  Withholding of food and fluids.  Comfort (palliative) care when the goal becomes comfort rather than a cure.  Organ and tissue donation.  A living will does not give instructions for distributing your money and property if you should pass away. It is recommended that you seek the advice of a lawyer when writing a will. Decisions about taxes, beneficiaries, and asset distribution will be legally binding. This process can relieve your family and friends of any concerns surrounding disputes or questions that may come up about the distribution of your assets. DNR or DNAR A DNR or DNAR order is a request not to have CPR in the event that your heart stops beating or you stop breathing. If a DNR or DNAR order has not been made and shared, a health care provider will try to help any patient whose heart has stopped or who has stopped breathing. If you plan to have surgery, talk with your health care provider about how your DNR or DNAR order will be followed if problems occur. Summary  Advance directives are the legal documents that allow you to make choices ahead of time about your health care and medical treatment in case you become unable to communicate for yourself.  The process of discussing and writing advance directives should happen over time. You can change the advance directives, even after you have signed them.  Advance directives include DNR or DNAR orders, living wills, and designating an agent as your medical power of attorney. This information is not intended to replace advice given to you by your health care provider. Make sure you discuss any questions you have with your health care provider. Document Released: 01/01/2008 Document Revised: 08/13/2016 Document Reviewed: 08/13/2016 Elsevier Interactive Patient Education  2017 [place] 65 Years and Older, Female Preventive care refers to lifestyle choices and visits with your health care provider that can promote health and wellness. What does preventive care include?  A yearly physical exam. This is also called an annual well check.  Dental exams once or twice a year.  Routine eye exams. Ask your health care provider how often you should have your eyes checked.  Personal  lifestyle choices, including: ? Daily care of your teeth and gums. ? Regular physical activity. ? Eating a healthy diet. ? Avoiding tobacco and drug use. ? Limiting alcohol use. ? Practicing safe sex. ? Taking low-dose aspirin every day. ? Taking vitamin and mineral supplements as recommended by your health care provider. What happens during an annual well check? The services and screenings done by your health care provider during your annual well check will depend on your age, overall health, lifestyle risk factors, and family history of disease. Counseling Your health care provider may ask you questions about your:  Alcohol use.  Tobacco use.  Drug use.  Emotional well-being.  Home and relationship well-being.  Sexual activity.  Eating habits.  History of falls.  Memory and ability to understand (cognition).  Work and work Statistician.  Reproductive health.  Screening You may have the following tests or measurements:  Height, weight, and BMI.  Blood pressure.  Lipid and cholesterol levels. These may be checked every 5 years, or more frequently if you are over 20 years old.  Skin check.  Lung cancer screening. You may have this screening every year starting at age 72 if you have a 30-pack-year history of smoking and currently smoke or have quit within the past 15 years.  Fecal occult blood test (FOBT) of the stool. You may have this test every year starting at age 33.  Flexible sigmoidoscopy or colonoscopy. You may have a sigmoidoscopy every 5  years or a colonoscopy every 10 years starting at age 13.  Hepatitis C blood test.  Hepatitis B blood test.  Sexually transmitted disease (STD) testing.  Diabetes screening. This is done by checking your blood sugar (glucose) after you have not eaten for a while (fasting). You may have this done every 1-3 years.  Bone density scan. This is done to screen for osteoporosis. You may have this done starting at age 77.  Mammogram. This may be done every 1-2 years. Talk to your health care provider about how often you should have regular mammograms.  Talk with your health care provider about your test results, treatment options, and if necessary, the need for more tests. Vaccines Your health care provider may recommend certain vaccines, such as:  Influenza vaccine. This is recommended every year.  Tetanus, diphtheria, and acellular pertussis (Tdap, Td) vaccine. You may need a Td booster every 10 years.  Varicella vaccine. You may need this if you have not been vaccinated.  Zoster vaccine. You may need this after age 2.  Measles, mumps, and rubella (MMR) vaccine. You may need at least one dose of MMR if you were born in 1957 or later. You may also need a second dose.  Pneumococcal 13-valent conjugate (PCV13) vaccine. One dose is recommended after age 64.  Pneumococcal polysaccharide (PPSV23) vaccine. One dose is recommended after age 64.  Meningococcal vaccine. You may need this if you have certain conditions.  Hepatitis A vaccine. You may need this if you have certain conditions or if you travel or work in places where you may be exposed to hepatitis A.  Hepatitis B vaccine. You may need this if you have certain conditions or if you travel or work in places where you may be exposed to hepatitis B.  Haemophilus influenzae type b (Hib) vaccine. You may need this if you have certain conditions.  Talk to your health care provider about which screenings and vaccines you need and how  often you need them. This information is not  intended to replace advice given to you by your health care provider. Make sure you discuss any questions you have with your health care provider. Document Released: 10/21/2015 Document Revised: 06/13/2016 Document Reviewed: 07/26/2015 Elsevier Interactive Patient Education  2017 [employer].

## 2017-04-01 NOTE — Progress Notes (Signed)
Subjective:   [name] is a 71 y.o. female who presents for an Initial Medicare Annual Wellness Visit.  The Patient was informed that the wellness visit is to identify future health risk and educate and initiate measures that can reduce risk for increased disease through the lifespan.   Describes health as fair, good or great? "It's not great. 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 She obtained a neck injury at work in January after moving a resident. She is in the process of setting up surgery, but worker's comp is requiring a second opinion, so she is working on setting up that appointment.  Review of Systems    No ROS.  Medicare Wellness Visit. Additional risk factors are reflected in the social history.`  Cardiac Risk Factors include: advanced age (>37mn, >>42women);dyslipidemia;diabetes mellitus;hypertension;smoking/ tobacco exposure  Sleep patterns: has daytime sleepiness, gets up 2-3 times nightly to void and sleeps 7-8 hours nightly.   Home Safety/Smoke Alarms: Feels safe in home. Smoke alarms in place.  Living environment; residence and Firearm Safety: Lives w/ husband and 2 grandchildren. 1-story house/ trailer, no firearms. Seat Belt Safety/Bike Helmet: Does not wear seat belt.   Counseling:   Eye Exam- Follows w/ Dr. HHerbert DeanerDental- Does not follow w/ dentist regularly, but is planning to set up an appointment.  Female: Pap- Aged out Mammo- Last 12/17/13. BI-RADS CATEGORY  1: Negative.  Dexa scan- last 12/17/13. Osteoporosis.     CCS- last 04/23/07. Normal.     Objective:    Today's Vitals   04/01/17 0857  BP: 138/80  Pulse: 60  SpO2: 93%  Weight: 166 lb 9.6 oz (75.6 kg)  Height: '5\' 5"'  (1.651 m)   Body mass index is 27.72 kg/m.   Current Medications (verified) Outpatient Encounter Prescriptions as of 04/01/2017  Medication Sig  . ALPRAZolam (XANAX) 0.25 MG tablet Take 1 tablet by mouth three times a day  if needed  . amLODipine (NORVASC) 10 MG tablet take 1 tablet by mouth once daily  . aspirin EC 81 MG tablet Take 81 mg by mouth daily.  .Marland Kitchenatorvastatin (LIPITOR) 40 MG tablet take 1 tablet by mouth once daily  . Blood Glucose Calibration (TAI DOC CONTROL) NORMAL SOLN   . Blood Glucose Monitoring Suppl (ACCU-CHEK AVIVA PLUS) w/Device KIT Check blood sugar daily. E11.9  . Carboxymethylcellulose Sodium (THERATEARS OP) Place 1 drop into both eyes daily as needed (dry eyes).  . Cyanocobalamin (VITAMIN B-12 PO) Take 1 tablet by mouth daily.  . ferrous sulfate 325 (65 FE) MG tablet Take 325 mg by mouth daily with breakfast.  . Flaxseed, Linseed, (FLAXSEED OIL PO) Take 1 capsule by mouth 2 (two) times daily.   .Marland KitchenFLUoxetine (PROZAC) 20 MG capsule take 1 capsule by mouth once daily  . fluticasone (FLONASE) 50 MCG/ACT nasal spray Place 2 sprays into both nostrils daily. (Patient taking differently: Place 2 sprays into both nostrils daily as needed. )  . gabapentin (NEURONTIN) 300 MG capsule Take 1 capsule by mouth at night for 7 days and then one by mouth twice per day. He is tolerating may take up to 3 times per day.  .Marland KitchenGINKGO BILOBA COMPLEX PO Take 1 tablet by mouth daily.   . insulin regular (NOVOLIN R RELION) 100 units/mL injection Inject 0.3 mLs (30 Units total) into the skin daily as needed for high blood sugar (at noon if CBG >200 and will be eating).  .[name]  Insulin Syringe-Needle U-100 (BD INSULIN SYRINGE ULTRAFINE) 31G X 5/16" 0.3 ML MISC 10 Units by Does not apply route 3 (three) times daily before meals.    Elmore Guise Devices (LANCING DEVICE) MISC   . levocetirizine (XYZAL) 5 MG tablet take 1 tablet by mouth every evening  . lisinopril (PRINIVIL,ZESTRIL) 10 MG tablet Take 1 tablet (10 mg total) by mouth daily.  [name] lisinopril (PRINIVIL,ZESTRIL) 10 MG tablet take 1 tablet by mouth once daily  . metoprolol succinate (TOPROL-XL) 50 MG 24 hr tablet take 1 tablet by mouth once daily with food  . metoprolol  succinate (TOPROL-XL) 50 MG 24 hr tablet take 1 tablet by mouth once daily with food  . Multiple Vitamin (MULTIVITAMIN WITH MINERALS) TABS Take 1 tablet by mouth daily.  [name] NOVOLIN N RELION 100 UNIT/ML injection Inject 30-60 Units into the skin 2 (two) times daily. Inject 40 units subcutaneously before breakfast and 60 units at bedtime  . vitamin E 400 UNIT capsule Take 400 Units by mouth daily.  [name] alendronate (FOSAMAX) 70 MG tablet Take 1 tablet (70 mg total) by mouth once a week. Take with full glass of water on empty stomach. (Patient not taking: Reported on 04/01/2017)  . gabapentin (NEURONTIN) 100 MG capsule take 1 capsule by mouth three times a day  . gabapentin (NEURONTIN) 100 MG capsule take 1 capsule by mouth three times a day (Patient not taking: Reported on 04/01/2017)  . glucose blood (ACCU-CHEK AVIVA) test strip Check blood sugar four times daily. E11.9  . valACYclovir (VALTREX) 1000 MG tablet Take 1 tablet (1,000 mg total) by mouth 3 (three) times daily. (Patient not taking: Reported on 04/01/2017)  . [DISCONTINUED] PHARMACIST CHOICE LANCETS MISC    No facility-administered encounter medications on file as of 04/01/2017.     Allergies (verified) Levemir [insulin detemir] and Metformin and related   History: Past Medical History:  Diagnosis Date  . Blockage of coronary artery of heart ([place])   . Depression   . Diabetes mellitus   . Hyperlipidemia   . Hypertension   . Pancreatitis   . Restless leg syndrome    Past Surgical History:  Procedure Laterality Date  . CATARACT EXTRACTION, BILATERAL    . CORONARY ANGIOPLASTY WITH STENT PLACEMENT    . FOOT SURGERY Right    cyst removal  . ROTATOR CUFF REPAIR     right  . TUBAL LIGATION     Family History  Problem Relation Age of Onset  . Heart attack Father   . Lung cancer Unknown   . Thyroid cancer Unknown   . Uterine cancer Unknown   . Breast cancer Sister   . Cancer Other        lung,thyroid,breast,uterine  . Heart  attack Sister   . Heart attack Brother 91   Social History   Occupational History  . Not on file.   Social History Main Topics  . Smoking status: Current Some Day Smoker    Packs/day: 0.20    Types: Cigarettes  . Smokeless tobacco: Never Used     Comment: Mostly smokes when nervous at [employer]  . Alcohol use No  . Drug use: No  . Sexual activity: Not on file    Tobacco Counseling Ready to quit: No Counseling given: Yes   Activities of Daily Living In your present state of health, do you have any difficulty performing the following activities: 04/01/2017  Hearing? N  Vision? N  Difficulty concentrating or making decisions? N  Walking  or climbing stairs? Y  Dressing or bathing? N  Doing errands, shopping? N  Preparing Food and eating ? N  Using the Toilet? N  In the past six months, have you accidently leaked urine? Y  Do you have problems with loss of bowel control? N  Managing your Medications? N  Managing your Finances? N  Housekeeping or managing your Housekeeping? N  Some recent data might be hidden    Immunizations and Health Maintenance Immunization History  Administered Date(s) Administered  . Influenza Split 08/08/2012  . Influenza, High Dose Seasonal PF 08/09/2014, 07/05/2015  . PPD Test 08/27/2011, 09/04/2011, 07/13/2014, 09/23/2015  . Pneumococcal Conjugate-13 08/09/2014  . Pneumococcal Polysaccharide-23 11/17/2013  . Td 03/11/2007  . Zoster 03/11/2007   Health Maintenance Due  Topic Date Due  . OPHTHALMOLOGY EXAM  11/24/2015  . MAMMOGRAM  12/18/2015  . HEMOGLOBIN A1C  05/13/2016  . TETANUS/TDAP  03/10/2017   Diabetic Foot Exam - Simple   Simple Foot Form Diabetic Foot exam was performed with the following findings:  Yes 04/01/2017  9:20 AM  Visual Inspection No deformities, no ulcerations, no other skin breakdown bilaterally:  Yes Sensation Testing Intact to touch and monofilament testing bilaterally:  Yes Pulse Check Posterior Tibialis and  Dorsalis pulse intact bilaterally:  Yes Comments     Patient Care Team: Carollee Herter, Alferd Apa, DO as PCP - General Jacelyn Pi, MD as Consulting Physician (Endocrinology) [place], [place] as Consulting Physician (Orthopedic Surgery) Ashok Pall, MD as Consulting Physician (Neurosurgery) Monna Fam, MD as Consulting Physician (Ophthalmology)  Indicate any recent Medical Services you may have received from other than [facility] providers in the past year (date may be approximate).     Assessment:   This is a routine wellness examination for [name]. Physical assessment deferred to PCP.  Hearing/Vision screen  Hearing Screening   '125Hz'  '250Hz'  '500Hz'  '1000Hz'  '2000Hz'  '3000Hz'  '4000Hz'  '6000Hz'  '8000Hz'   Right ear:   Pass Pass Pass  Pass    Left ear:   Pass Fail Pass  Pass    Comments: Able to hear conversational tones w/o difficulty. Passes whisper test.  Vision Screening Comments: No vision issues. Does not wear glasses. Follows w/ Dr. Herbert Deaner. Due for eye exam.  Dietary issues and exercise activities discussed: Current Exercise Habits: The patient has a physically strenous job, but has no regular exercise apart from work.  Diet (meal preparation, eat out, water intake, caffeinated beverages, dairy products, fruits and vegetables): in general, a "healthy" diet  , well balanced, on average, 2 meals per day, diabetic. Trying to minimize sweets and starches and decrease intake to lose weight and control diabetes. Drinks 4 8 oz glasses of water and 3 bottles of Diet Pepsi daily. Dinner: varies-usually a meat, a carb, and some kind of green vegetable      Goals    . Increase physical activity          Start Silver Sneakers.    . Quit smoking / using tobacco      Depression Screen PHQ 2/9 Scores 04/01/2017 10/11/2016 11/14/2015 11/29/2014 11/17/2013  PHQ - 2 Score 0 0 0 0 0    Fall Risk Fall Risk  04/01/2017 10/11/2016 11/14/2015 11/29/2014 11/17/2013  Falls in the past year? No No No No No     Cognitive Function: MMSE - Mini Mental State Exam 04/01/2017  Orientation to time 5  Orientation to Place 5  Registration 3  Attention/ Calculation 5  Recall 2  Language- name 2 objects  2  Language- repeat 1  Language- follow 3 step command 3  Language- read & follow direction 1  Write a sentence 1  Copy design 1  Total score 29        Screening Tests Health Maintenance  Topic Date Due  . OPHTHALMOLOGY EXAM  11/24/2015  . MAMMOGRAM  12/18/2015  . HEMOGLOBIN A1C  05/13/2016  . TETANUS/TDAP  03/10/2017  . COLONOSCOPY  04/22/2017  . INFLUENZA VACCINE  05/08/2017  . FOOT EXAM  04/01/2018  . DEXA SCAN  Completed  . Hepatitis C Screening  Completed  . PNA vac Low Risk Adult  Completed      Plan:    Follow-up w/ PCP as scheduled.  Wear your seat belt.  Schedule a routine diabetic eye exam.   Cologuard ordered via portal. Order # [id number].  I have personally reviewed and noted the following in the patient's chart:   . Medical and social history . Use of alcohol, tobacco or illicit drugs  . Current medications and supplements . Functional ability and status . Nutritional status . Physical activity . Advanced directives . List of other physicians . Vitals . Screenings to include cognitive, depression, and falls . Referrals and appointments  In addition, I have reviewed and discussed with patient certain preventive protocols, quality metrics, and best practice recommendations. A written personalized care plan for preventive services as well as general preventive health recommendations were provided to patient.     Dorrene German, RN   04/01/2017

## 2017-04-15 LAB — COLOGUARD: Cologuard: NEGATIVE

## 2017-04-16 ENCOUNTER — Telehealth (INDEPENDENT_AMBULATORY_CARE_PROVIDER_SITE_OTHER): Payer: Self-pay | Admitting: Orthopaedic Surgery

## 2017-04-16 NOTE — Telephone Encounter (Signed)
Records faxed to Dr. Noel Geroldohen

## 2017-04-25 DIAGNOSIS — Z1211 Encounter for screening for malignant neoplasm of colon: Secondary | ICD-10-CM | POA: Diagnosis not present

## 2017-04-26 LAB — COLOGUARD: Cologuard: NEGATIVE

## 2017-05-10 ENCOUNTER — Encounter: Payer: Self-pay | Admitting: Family Medicine

## 2017-05-16 ENCOUNTER — Other Ambulatory Visit: Payer: Self-pay | Admitting: Family Medicine

## 2017-05-16 DIAGNOSIS — Z1231 Encounter for screening mammogram for malignant neoplasm of breast: Secondary | ICD-10-CM

## 2017-05-21 DIAGNOSIS — E049 Nontoxic goiter, unspecified: Secondary | ICD-10-CM | POA: Diagnosis not present

## 2017-05-21 DIAGNOSIS — I10 Essential (primary) hypertension: Secondary | ICD-10-CM | POA: Diagnosis not present

## 2017-05-21 DIAGNOSIS — E7800 Pure hypercholesterolemia, unspecified: Secondary | ICD-10-CM | POA: Diagnosis not present

## 2017-05-21 DIAGNOSIS — R809 Proteinuria, unspecified: Secondary | ICD-10-CM | POA: Diagnosis not present

## 2017-05-21 DIAGNOSIS — E1165 Type 2 diabetes mellitus with hyperglycemia: Secondary | ICD-10-CM | POA: Diagnosis not present

## 2017-05-29 ENCOUNTER — Ambulatory Visit
Admission: RE | Admit: 2017-05-29 | Discharge: 2017-05-29 | Disposition: A | Discharge status: Home / Self Care | Payer: Medicare HMO | Source: Ambulatory Visit | Attending: Family Medicine | Admitting: Family Medicine

## 2017-05-29 DIAGNOSIS — M810 Age-related osteoporosis without current pathological fracture: Secondary | ICD-10-CM

## 2017-05-29 DIAGNOSIS — Z780 Asymptomatic menopausal state: Secondary | ICD-10-CM | POA: Diagnosis not present

## 2017-05-29 DIAGNOSIS — Z1231 Encounter for screening mammogram for malignant neoplasm of breast: Secondary | ICD-10-CM | POA: Diagnosis not present

## 2017-06-19 ENCOUNTER — Telehealth: Payer: Self-pay | Admitting: Family Medicine

## 2017-06-19 NOTE — Telephone Encounter (Signed)
°  Relation to EA:VWUJpt:self Call back [phone]   Reason for call:  Patient drop off Application for Disability Parking Placard, placed form in paperwork bin,  Informed patient 5 to 7 business day turnaround please call [phone]

## 2017-06-24 ENCOUNTER — Telehealth: Payer: Self-pay | Admitting: *Deleted

## 2017-06-24 NOTE — Telephone Encounter (Signed)
Completed Physician section on Disability Parking Placard Application; forwarded to provider/SLS 09/17

## 2017-06-25 NOTE — Telephone Encounter (Signed)
Patient informed ready for p/u during regular business hours; pt understood & agreed/SLS 09/18

## 2017-07-01 ENCOUNTER — Encounter: Payer: Self-pay | Admitting: Family Medicine

## 2017-07-01 DIAGNOSIS — Z0289 Encounter for other administrative examinations: Secondary | ICD-10-CM

## 2017-07-01 DIAGNOSIS — E7800 Pure hypercholesterolemia, unspecified: Secondary | ICD-10-CM | POA: Diagnosis not present

## 2017-07-01 DIAGNOSIS — I10 Essential (primary) hypertension: Secondary | ICD-10-CM | POA: Diagnosis not present

## 2017-07-01 DIAGNOSIS — E049 Nontoxic goiter, unspecified: Secondary | ICD-10-CM | POA: Diagnosis not present

## 2017-07-01 DIAGNOSIS — E1165 Type 2 diabetes mellitus with hyperglycemia: Secondary | ICD-10-CM | POA: Diagnosis not present

## 2017-09-05 ENCOUNTER — Other Ambulatory Visit: Payer: Self-pay | Admitting: Family Medicine

## 2017-10-11 ENCOUNTER — Encounter: Payer: Self-pay | Admitting: Family Medicine

## 2017-10-14 ENCOUNTER — Encounter (HOSPITAL_BASED_OUTPATIENT_CLINIC_OR_DEPARTMENT_OTHER): Payer: Self-pay

## 2017-10-14 ENCOUNTER — Emergency Department (HOSPITAL_BASED_OUTPATIENT_CLINIC_OR_DEPARTMENT_OTHER)
Admission: EM | Admit: 2017-10-14 | Discharge: 2017-10-15 | Disposition: A | Discharge status: Home / Self Care | Payer: Medicare HMO | Attending: Emergency Medicine | Admitting: Emergency Medicine

## 2017-10-14 ENCOUNTER — Other Ambulatory Visit: Payer: Self-pay

## 2017-10-14 DIAGNOSIS — Z955 Presence of coronary angioplasty implant and graft: Secondary | ICD-10-CM | POA: Insufficient documentation

## 2017-10-14 DIAGNOSIS — Z79899 Other long term (current) drug therapy: Secondary | ICD-10-CM | POA: Insufficient documentation

## 2017-10-14 DIAGNOSIS — I10 Essential (primary) hypertension: Secondary | ICD-10-CM | POA: Insufficient documentation

## 2017-10-14 DIAGNOSIS — F17210 Nicotine dependence, cigarettes, uncomplicated: Secondary | ICD-10-CM | POA: Diagnosis not present

## 2017-10-14 DIAGNOSIS — E119 Type 2 diabetes mellitus without complications: Secondary | ICD-10-CM | POA: Diagnosis not present

## 2017-10-14 DIAGNOSIS — Z794 Long term (current) use of insulin: Secondary | ICD-10-CM | POA: Diagnosis not present

## 2017-10-14 DIAGNOSIS — J029 Acute pharyngitis, unspecified: Secondary | ICD-10-CM

## 2017-10-14 DIAGNOSIS — J028 Acute pharyngitis due to other specified organisms: Secondary | ICD-10-CM | POA: Diagnosis not present

## 2017-10-14 DIAGNOSIS — I2510 Atherosclerotic heart disease of native coronary artery without angina pectoris: Secondary | ICD-10-CM | POA: Insufficient documentation

## 2017-10-14 DIAGNOSIS — R05 Cough: Secondary | ICD-10-CM | POA: Diagnosis not present

## 2017-10-14 DIAGNOSIS — B349 Viral infection, unspecified: Secondary | ICD-10-CM | POA: Diagnosis not present

## 2017-10-14 NOTE — ED Triage Notes (Signed)
C/o URI sx x 4 days-NAD-steady gait 

## 2017-10-15 ENCOUNTER — Emergency Department (HOSPITAL_BASED_OUTPATIENT_CLINIC_OR_DEPARTMENT_OTHER): Payer: Medicare HMO

## 2017-10-15 DIAGNOSIS — R05 Cough: Secondary | ICD-10-CM | POA: Diagnosis not present

## 2017-10-15 LAB — RAPID STREP SCREEN (MED CTR MEBANE ONLY): STREPTOCOCCUS, GROUP A SCREEN (DIRECT): NEGATIVE

## 2017-10-15 NOTE — ED Provider Notes (Signed)
[place] [facility] Provider Note: Georgena Spurling, MD, FACEP  CSN: [id number] MRN: [id number] ARRIVAL: 10/14/17 at 2128 ROOM: [place]  Sore Throat   HISTORY OF PRESENT ILLNESS  10/15/17 12:47 AM [name] is a 72 y.o. female with a 4-day history of sore throat with nasal congestion, sinus pressure and ear pressure.  She rates her sore throat is a 6 out of 10, worse with swallowing.  She has had occasional nonproductive cough.  She denies fever.  She has been taking Chloraseptic lozenges but no other medications.  She denies nausea, vomiting and diarrhea.   Past Medical History:  Diagnosis Date  . Blockage of coronary artery of heart ([place])   . Depression   . Diabetes mellitus   . Hyperlipidemia   . Hypertension   . Pancreatitis   . Restless leg syndrome     Past Surgical History:  Procedure Laterality Date  . CATARACT EXTRACTION, BILATERAL    . CORONARY ANGIOPLASTY WITH STENT PLACEMENT    . FOOT SURGERY Right    cyst removal  . ROTATOR CUFF REPAIR     right  . TUBAL LIGATION      Family History  Problem Relation Age of Onset  . Heart attack Father   . Lung cancer Unknown   . Thyroid cancer Unknown   . Uterine cancer Unknown   . Breast cancer Sister   . Cancer Other        lung,thyroid,breast,uterine  . Heart attack Sister   . Heart attack Brother 4    Social History   Tobacco Use  . Smoking status: Current Some Day Smoker    Packs/day: 0.20    Types: Cigarettes  . Smokeless tobacco: Never Used  Substance Use Topics  . Alcohol use: No  . Drug use: No    Prior to Admission medications   Medication Sig Start Date End Date Taking? Authorizing Provider  alendronate (FOSAMAX) 70 MG tablet take 1 tablet by mouth every week TAKE WITH FULL GLASS OF WATER ON AN EMPTY STOMACH 09/06/17   Carollee Herter, Alferd Apa, DO  ALPRAZolam Duanne Moron) 0.25 MG tablet Take 1 tablet by mouth three times a day if needed 08/23/16   Carollee Herter, Alferd Apa,  DO  amLODipine (NORVASC) 10 MG tablet take 1 tablet by mouth once daily 09/06/17   Carollee Herter, Alferd Apa, DO  aspirin EC 81 MG tablet Take 81 mg by mouth daily.    [provider]  atorvastatin (LIPITOR) 40 MG tablet take 1 tablet by mouth once daily 01/15/17   Carollee Herter, Alferd Apa, DO  Blood Glucose Calibration (TAI [facility] CONTROL) NORMAL SOLN  10/19/12   [provider]  Blood Glucose Monitoring Suppl (ACCU-CHEK AVIVA PLUS) w/Device KIT Check blood sugar daily. E11.9 11/25/15   Ann Held, DO  Carboxymethylcellulose Sodium (THERATEARS OP) Place 1 drop into both eyes daily as needed (dry eyes).    [provider]  Cyanocobalamin (VITAMIN B-12 PO) Take 1 tablet by mouth daily.    [provider]  ferrous sulfate 325 (65 FE) MG tablet Take 325 mg by mouth daily with breakfast.    [provider]  Flaxseed, Linseed, (FLAXSEED OIL PO) Take 1 capsule by mouth 2 (two) times daily.     [provider]  FLUoxetine (PROZAC) 20 MG capsule take 1 capsule by mouth once daily 11/29/16   Carollee Herter, Kendrick Fries R, DO  fluticasone (FLONASE) 50 MCG/ACT nasal spray  Place 2 sprays into both nostrils daily. Patient taking differently: Place 2 sprays into both nostrils daily as needed.  11/14/15   Ann Held, DO  gabapentin (NEURONTIN) 100 MG capsule take 1 capsule by mouth three times a day Patient not taking: Reported on 04/01/2017 01/17/17   Mosie Lukes, MD  gabapentin (NEURONTIN) 300 MG capsule Take 1 capsule by mouth at night for 7 days and then one by mouth twice per day. He is tolerating may take up to 3 times per day. 09/25/16   Magnus Sinning, MD  GINKGO BILOBA COMPLEX PO Take 1 tablet by mouth daily.     [provider]  glucose blood (ACCU-CHEK AVIVA) test strip Check blood sugar four times daily. E11.9 12/14/14   Ann Held, DO  insulin regular (NOVOLIN R RELION) 100 units/mL injection Inject 0.3 mLs (30 Units total)  into the skin daily as needed for high blood sugar (at noon if CBG >200 and will be eating). 03/01/16   Ann Held, DO  Insulin Syringe-Needle U-100 (BD INSULIN SYRINGE ULTRAFINE) 31G X 5/16" 0.3 ML MISC 10 Units by Does not apply route 3 (three) times daily before meals.      [provider]  Lancet Devices (LANCING DEVICE) [place]  10/19/12   [provider]  levocetirizine (XYZAL) 5 MG tablet take 1 tablet by mouth every evening 11/29/16   Carollee Herter, Alferd Apa, DO  lisinopril (PRINIVIL,ZESTRIL) 10 MG tablet take 1 tablet by mouth once daily 01/15/17   Carollee Herter, Alferd Apa, DO  metoprolol succinate (TOPROL-XL) 50 MG 24 hr tablet take 1 tablet by mouth once daily with food 09/06/17   Carollee Herter, Alferd Apa, DO  Multiple Vitamin (MULTIVITAMIN WITH MINERALS) TABS Take 1 tablet by mouth daily.    [provider]  NOVOLIN N RELION 100 UNIT/ML injection Inject 30-60 Units into the skin 2 (two) times daily. Inject 40 units subcutaneously before breakfast and 60 units at bedtime 05/05/12   [provider]  valACYclovir (VALTREX) 1000 MG tablet Take 1 tablet (1,000 mg total) by mouth 3 (three) times daily. Patient not taking: Reported on 04/01/2017 11/26/16   Roma Schanz R, DO  vitamin E 400 UNIT capsule Take 400 Units by mouth daily.    [provider]    Allergies Levemir [insulin detemir] and Metformin and related   REVIEW OF SYSTEMS  Negative except as noted here or in the History of Present Illness.   PHYSICAL EXAMINATION  Initial Vital Signs Blood pressure (!) 162/66, pulse 66, temperature 98.1 F (36.7 C), temperature source Oral, resp. rate 18, height '5\' 5"'  (1.651 m), weight 77.7 kg (171 lb 4.8 oz), SpO2 100 %.  Examination General: Well-developed, well-nourished female in no acute distress; appearance consistent with age of record HENT: normocephalic; atraumatic; no pharyngeal erythema or exudate; no dysphonia; TMs normal Eyes:  pupils equal, round and reactive to light; extraocular muscles intact; lens implants Neck: supple Heart: regular rate and rhythm Lungs: clear to auscultation bilaterally Abdomen: soft; nondistended; nontender; bowel sounds present Extremities: No deformity; full range of motion Neurologic: Awake, alert and oriented; motor function intact in all extremities and symmetric; no facial droop Skin: Warm and dry Psychiatric: Normal mood and affect   RESULTS  Summary of this visit's results, reviewed by myself:   EKG Interpretation  Date/Time:    Ventricular Rate:    PR Interval:    QRS Duration:   QT Interval:  QTC Calculation:   R Axis:     Text Interpretation:        Laboratory Studies: Results for orders placed or performed during the hospital encounter of 10/14/17 (from the past 24 hour(s))  Rapid strep screen     Status: None   Collection Time: 10/14/17 11:54 PM  Result Value Ref Range   Streptococcus, Group A Screen (Direct) NEGATIVE NEGATIVE   Imaging Studies: Dg Chest 2 View  Result Date: 10/15/2017 CLINICAL DATA:  Sore throat with cough for the past 4 days. EXAM: CHEST  2 VIEW COMPARISON:  None. FINDINGS: The heart size and mediastinal contours are within normal limits. Minimal aortic atherosclerosis at the arch without aneurysm. Both lungs are clear. Midline trachea. The visualized skeletal structures are unremarkable. IMPRESSION: No active cardiopulmonary disease. Electronically Signed   By: Ashley Royalty M.D.   On: 10/15/2017 00:21    ED COURSE  Nursing notes and initial vitals signs, including pulse oximetry, reviewed.  Vitals:   10/14/17 2146  BP: (!) 162/66  Pulse: 66  Resp: 18  Temp: 98.1 F (36.7 C)  TempSrc: Oral  SpO2: 100%  Weight: 77.7 kg (171 lb 4.8 oz)  Height: '5\' 5"'  (1.651 m)    PROCEDURES    ED DIAGNOSES     ICD-10-CM   1. Viral pharyngitis J02.9        Ayce Pietrzyk, MD 10/15/17 [phone]

## 2017-10-15 NOTE — ED Notes (Signed)
Pt verbalizes understanding of d/c instructions and denies any further needs at this time. 

## 2017-10-17 LAB — CULTURE, GROUP A STREP (THRC)

## 2017-10-18 DIAGNOSIS — E7800 Pure hypercholesterolemia, unspecified: Secondary | ICD-10-CM | POA: Diagnosis not present

## 2017-10-18 DIAGNOSIS — E049 Nontoxic goiter, unspecified: Secondary | ICD-10-CM | POA: Diagnosis not present

## 2017-10-18 DIAGNOSIS — E1165 Type 2 diabetes mellitus with hyperglycemia: Secondary | ICD-10-CM | POA: Diagnosis not present

## 2017-11-11 DIAGNOSIS — E1165 Type 2 diabetes mellitus with hyperglycemia: Secondary | ICD-10-CM | POA: Diagnosis not present

## 2017-11-11 DIAGNOSIS — E7800 Pure hypercholesterolemia, unspecified: Secondary | ICD-10-CM | POA: Diagnosis not present

## 2017-11-11 DIAGNOSIS — I10 Essential (primary) hypertension: Secondary | ICD-10-CM | POA: Diagnosis not present

## 2017-11-11 DIAGNOSIS — E1140 Type 2 diabetes mellitus with diabetic neuropathy, unspecified: Secondary | ICD-10-CM | POA: Diagnosis not present

## 2017-11-11 DIAGNOSIS — E049 Nontoxic goiter, unspecified: Secondary | ICD-10-CM | POA: Diagnosis not present

## 2017-11-18 ENCOUNTER — Other Ambulatory Visit: Payer: Self-pay | Admitting: Family Medicine

## 2017-12-13 ENCOUNTER — Encounter: Payer: Self-pay | Admitting: Family Medicine

## 2017-12-13 ENCOUNTER — Ambulatory Visit (INDEPENDENT_AMBULATORY_CARE_PROVIDER_SITE_OTHER): Payer: Medicare HMO | Admitting: Family Medicine

## 2017-12-13 VITALS — BP 132/70 | HR 60 | Temp 98.4°F | Resp 16 | Ht 64.96 in | Wt 172.4 lb

## 2017-12-13 DIAGNOSIS — I10 Essential (primary) hypertension: Secondary | ICD-10-CM

## 2017-12-13 DIAGNOSIS — E1165 Type 2 diabetes mellitus with hyperglycemia: Secondary | ICD-10-CM

## 2017-12-13 DIAGNOSIS — E785 Hyperlipidemia, unspecified: Secondary | ICD-10-CM

## 2017-12-13 DIAGNOSIS — M21619 Bunion of unspecified foot: Secondary | ICD-10-CM | POA: Diagnosis not present

## 2017-12-13 DIAGNOSIS — M79641 Pain in right hand: Secondary | ICD-10-CM | POA: Diagnosis not present

## 2017-12-13 DIAGNOSIS — M79642 Pain in left hand: Secondary | ICD-10-CM

## 2017-12-13 DIAGNOSIS — Z0001 Encounter for general adult medical examination with abnormal findings: Secondary | ICD-10-CM | POA: Diagnosis not present

## 2017-12-13 DIAGNOSIS — K05219 Aggressive periodontitis, localized, unspecified severity: Secondary | ICD-10-CM | POA: Insufficient documentation

## 2017-12-13 DIAGNOSIS — Z0000 Encounter for general adult medical examination without abnormal findings: Secondary | ICD-10-CM

## 2017-12-13 LAB — CBC WITH DIFFERENTIAL/PLATELET
BASOS PCT: 0.7 % (ref 0.0–3.0)
Basophils Absolute: 0.1 10*3/uL (ref 0.0–0.1)
EOS PCT: 1.1 % (ref 0.0–5.0)
Eosinophils Absolute: 0.1 10*3/uL (ref 0.0–0.7)
HCT: 37.9 % (ref 36.0–46.0)
HEMOGLOBIN: 13 g/dL (ref 12.0–15.0)
LYMPHS ABS: 3.3 10*3/uL (ref 0.7–4.0)
Lymphocytes Relative: 41.7 % (ref 12.0–46.0)
MCHC: 34.2 g/dL (ref 30.0–36.0)
MCV: 87.8 fl (ref 78.0–100.0)
MONO ABS: 0.5 10*3/uL (ref 0.1–1.0)
Monocytes Relative: 6.5 % (ref 3.0–12.0)
NEUTROS PCT: 50 % (ref 43.0–77.0)
Neutro Abs: 4 10*3/uL (ref 1.4–7.7)
Platelets: 180 10*3/uL (ref 150.0–400.0)
RBC: 4.32 Mil/uL (ref 3.87–5.11)
RDW: 13.2 % (ref 11.5–15.5)
WBC: 8 10*3/uL (ref 4.0–10.5)

## 2017-12-13 LAB — COMPREHENSIVE METABOLIC PANEL
ALT: 17 U/L (ref 0–35)
AST: 17 U/L (ref 0–37)
Albumin: 4.1 g/dL (ref 3.5–5.2)
Alkaline Phosphatase: 50 U/L (ref 39–117)
BUN: 21 mg/dL (ref 6–23)
CHLORIDE: 101 meq/L (ref 96–112)
CO2: 29 meq/L (ref 19–32)
CREATININE: 0.79 mg/dL (ref 0.40–1.20)
Calcium: 10.3 mg/dL (ref 8.4–10.5)
GFR: 92.02 mL/min (ref >60.00)
GLUCOSE: 199 mg/dL — AB (ref 70–99)
POTASSIUM: 3.9 meq/L (ref 3.5–5.1)
SODIUM: 136 meq/L (ref 135–145)
Total Bilirubin: 0.8 mg/dL (ref 0.2–1.2)
Total Protein: 6.8 g/dL (ref 6.0–8.3)

## 2017-12-13 LAB — LDL CHOLESTEROL, DIRECT: LDL DIRECT: 70 mg/dL

## 2017-12-13 LAB — LIPID PANEL
CHOL/HDL RATIO: 4
Cholesterol: 135 mg/dL (ref 0–200)
HDL: 32.9 mg/dL — ABNORMAL LOW (ref >39.00)
NONHDL: 102.54
Triglycerides: 209 mg/dL — ABNORMAL HIGH (ref 0.0–149.0)
VLDL: 41.8 mg/dL — AB (ref 0.0–40.0)

## 2017-12-13 MED ORDER — AMOXICILLIN-POT CLAVULANATE 875-125 MG PO TABS: 1.0000 | ORAL_TABLET | Freq: Two times a day (BID) | ORAL | 0 refills | Status: DC

## 2017-12-13 NOTE — Patient Instructions (Signed)
Preventive Care 72 Years and Older, Female Preventive care refers to lifestyle choices and visits with your health care provider that can promote health and wellness. What does preventive care include?  A yearly physical exam. This is also called an annual well check.  Dental exams once or twice a year.  Routine eye exams. Ask your health care provider how often you should have your eyes checked.  Personal lifestyle choices, including: ? Daily care of your teeth and gums. ? Regular physical activity. ? Eating a healthy diet. ? Avoiding tobacco and drug use. ? Limiting alcohol use. ? Practicing safe sex. ? Taking low-dose aspirin every day. ? Taking vitamin and mineral supplements as recommended by your health care provider. What happens during an annual well check? The services and screenings done by your health care provider during your annual well check will depend on your age, overall health, lifestyle risk factors, and family history of disease. Counseling Your health care provider may ask you questions about your:  Alcohol use.  Tobacco use.  Drug use.  Emotional well-being.  Home and relationship well-being.  Sexual activity.  Eating habits.  History of falls.  Memory and ability to understand (cognition).  Work and work environment.  Reproductive health.  Screening You may have the following tests or measurements:  Height, weight, and BMI.  Blood pressure.  Lipid and cholesterol levels. These may be checked every 5 years, or more frequently if you are over 50 years old.  Skin check.  Lung cancer screening. You may have this screening every year starting at age 55 if you have a 30-pack-year history of smoking and currently smoke or have quit within the past 15 years.  Fecal occult blood test (FOBT) of the stool. You may have this test every year starting at age 50.  Flexible sigmoidoscopy or colonoscopy. You may have a sigmoidoscopy every 5 years or  a colonoscopy every 10 years starting at age 50.  Hepatitis C blood test.  Hepatitis B blood test.  Sexually transmitted disease (STD) testing.  Diabetes screening. This is done by checking your blood sugar (glucose) after you have not eaten for a while (fasting). You may have this done every 1-3 years.  Bone density scan. This is done to screen for osteoporosis. You may have this done starting at age 65.  Mammogram. This may be done every 1-2 years. Talk to your health care provider about how often you should have regular mammograms.  Talk with your health care provider about your test results, treatment options, and if necessary, the need for more tests. Vaccines Your health care provider may recommend certain vaccines, such as:  Influenza vaccine. This is recommended every year.  Tetanus, diphtheria, and acellular pertussis (Tdap, Td) vaccine. You may need a Td booster every 10 years.  Varicella vaccine. You may need this if you have not been vaccinated.  Zoster vaccine. You may need this after age 60.  Measles, mumps, and rubella (MMR) vaccine. You may need at least one dose of MMR if you were born in 1957 or later. You may also need a second dose.  Pneumococcal 13-valent conjugate (PCV13) vaccine. One dose is recommended after age 65.  Pneumococcal polysaccharide (PPSV23) vaccine. One dose is recommended after age 65.  Meningococcal vaccine. You may need this if you have certain conditions.  Hepatitis A vaccine. You may need this if you have certain conditions or if you travel or work in places where you may be exposed to hepatitis   A.  Hepatitis B vaccine. You may need this if you have certain conditions or if you travel or work in places where you may be exposed to hepatitis B.  Haemophilus influenzae type b (Hib) vaccine. You may need this if you have certain conditions.  Talk to your health care provider about which screenings and vaccines you need and how often you  need them. This information is not intended to replace advice given to you by your health care provider. Make sure you discuss any questions you have with your health care provider. Document Released: 10/21/2015 Document Revised: 06/13/2016 Document Reviewed: 07/26/2015 Elsevier Interactive Patient Education  2018 [employer]  

## 2017-12-13 NOTE — Assessment & Plan Note (Signed)
Well controlled, no changes to meds. Encouraged heart healthy diet such as the DASH diet and exercise as tolerated.  °

## 2017-12-13 NOTE — Progress Notes (Addendum)
Subjective:  I acted as a Education administrator for [employer]. [name], [name]   Patient ID: [name], female    DOB: 1946-03-18, 72 y.o.   MRN: [id number]  Chief Complaint  Patient presents with  . Annual Exam    HPI  Patient is in today for annual exam.  Pt sees dr Chalmers Cater for her DM.   Pt c/o b/l hand pain / numbness and mass on R wrist.  No known injury---  She is requesting a referral She is also c/o abscess on her gums and she does not have a dentist.    Patient Care Team: Carollee Herter, Alferd Apa, DO as PCP - General Jacelyn Pi, MD as Consulting Physician (Endocrinology) Ashok Pall, MD as Consulting Physician (Neurosurgery) Monna Fam, MD as Consulting Physician (Ophthalmology)   Past Medical History:  Diagnosis Date  . Blockage of coronary artery of heart ([place])   . Depression   . Diabetes mellitus   . Hyperlipidemia   . Hypertension   . Pancreatitis   . Restless leg syndrome     Past Surgical History:  Procedure Laterality Date  . CATARACT EXTRACTION, BILATERAL    . CORONARY ANGIOPLASTY WITH STENT PLACEMENT    . FOOT SURGERY Right    cyst removal  . ROTATOR CUFF REPAIR     right  . TUBAL LIGATION      Family History  Problem Relation Age of Onset  . Heart attack Father   . Lung cancer Unknown   . Thyroid cancer Unknown   . Uterine cancer Unknown   . Breast cancer Sister   . Cancer Other        lung,thyroid,breast,uterine  . Heart attack Sister   . Heart attack Brother 57    Social History   Socioeconomic History  . Marital status: Married    Spouse name: Not on file  . Number of children: Not on file  . Years of education: Not on file  . Highest education level: Not on file  Social Needs  . Financial resource strain: Not on file  . Food insecurity - worry: Not on file  . Food insecurity - inability: Not on file  . Transportation needs - medical: Not on file  . Transportation needs - non-medical: Not on file  Occupational History  .  Not on file  Tobacco Use  . Smoking status: Current Some Day Smoker    Packs/day: 0.20    Types: Cigarettes  . Smokeless tobacco: Never Used  Substance and Sexual Activity  . Alcohol use: No  . Drug use: No  . Sexual activity: Not on file  Other Topics Concern  . Not on file  Social History Narrative   Exercise: seldom   Caffeine use: seldom    Outpatient Medications Prior to Visit  Medication Sig Dispense Refill  . alendronate (FOSAMAX) 70 MG tablet take 1 tablet by mouth every week TAKE WITH FULL GLASS OF WATER ON AN EMPTY STOMACH 4 tablet 12  . ALPRAZolam (XANAX) 0.25 MG tablet Take 1 tablet by mouth three times a day if needed 60 tablet 0  . amLODipine (NORVASC) 10 MG tablet take 1 tablet by mouth once daily 30 tablet 5  . aspirin EC 81 MG tablet Take 81 mg by mouth daily.    [name] atorvastatin (LIPITOR) 40 MG tablet take 1 tablet by mouth once daily 90 tablet 1  . Blood Glucose Calibration (TAI DOC CONTROL) NORMAL SOLN     . Blood Glucose Monitoring  Suppl (ACCU-CHEK AVIVA PLUS) w/Device KIT Check blood sugar daily. E11.9 1 kit 0  . Carboxymethylcellulose Sodium (THERATEARS OP) Place 1 drop into both eyes daily as needed (dry eyes).    . Cyanocobalamin (VITAMIN B-12 PO) Take 1 tablet by mouth daily.    . ferrous sulfate 325 (65 FE) MG tablet Take 325 mg by mouth daily with breakfast.    . Flaxseed, Linseed, (FLAXSEED OIL PO) Take 1 capsule by mouth 2 (two) times daily.     [name] FLUoxetine (PROZAC) 20 MG capsule take 1 capsule by mouth once daily 30 capsule 5  . fluticasone (FLONASE) 50 MCG/ACT nasal spray Place 2 sprays into both nostrils daily. (Patient taking differently: Place 2 sprays into both nostrils daily as needed. ) 16 g 6  . gabapentin (NEURONTIN) 300 MG capsule Take 1 capsule by mouth at night for 7 days and then one by mouth twice per day. He is tolerating may take up to 3 times per day. 180 capsule 1  . GINKGO BILOBA COMPLEX PO Take 1 tablet by mouth daily.     [name] glucose  blood (ACCU-CHEK AVIVA) test strip Check blood sugar four times daily. E11.9 400 each 3  . insulin regular (NOVOLIN R RELION) 100 units/mL injection Inject 0.3 mLs (30 Units total) into the skin daily as needed for high blood sugar (at noon if CBG >200 and will be eating). 10 mL   . Insulin Syringe-Needle U-100 (BD INSULIN SYRINGE ULTRAFINE) 31G X 5/16" 0.3 ML MISC 10 Units by Does not apply route 3 (three) times daily before meals.      Elmore Guise Devices (LANCING DEVICE) MISC     . levocetirizine (XYZAL) 5 MG tablet take 1 tablet by mouth every evening 30 tablet 5  . lisinopril (PRINIVIL,ZESTRIL) 10 MG tablet take 1 tablet by mouth once daily 90 tablet 3  . metoprolol succinate (TOPROL-XL) 50 MG 24 hr tablet take 1 tablet by mouth once daily with food 90 tablet 1  . Multiple Vitamin (MULTIVITAMIN WITH MINERALS) TABS Take 1 tablet by mouth daily.    [name] NOVOLIN N RELION 100 UNIT/ML injection Inject 30-60 Units into the skin 2 (two) times daily. Inject 40 units subcutaneously before breakfast and 60 units at bedtime    . vitamin E 400 UNIT capsule Take 400 Units by mouth daily.     No facility-administered medications prior to visit.     Allergies  Allergen Reactions  . Levemir [Insulin Detemir] Hives and Swelling  . Metformin And Related Other (See Comments)    Sick on stomach, sweaty, throw up, pain    Review of Systems  Constitutional: Negative for chills, fever and malaise/fatigue.  HENT: Negative for congestion and hearing loss.        Abscess in gums  Eyes: Negative for discharge.  Respiratory: Negative for cough, sputum production and shortness of breath.   Cardiovascular: Negative for chest pain, palpitations and leg swelling.  Gastrointestinal: Negative for abdominal pain, blood in stool, constipation, diarrhea, heartburn, nausea and vomiting.  Genitourinary: Negative for dysuria, frequency, hematuria and urgency.  Musculoskeletal: Positive for joint pain. Negative for back pain,  falls and myalgias.       B/l hand pain  R foot pain-- bunion, ?spur  Skin: Negative for rash.  Neurological: Negative for dizziness, sensory change, loss of consciousness, weakness and headaches.  Endo/Heme/Allergies: Negative for environmental allergies. Does not bruise/bleed easily.  Psychiatric/Behavioral: Negative for depression and suicidal ideas. The patient is not nervous/anxious and  does not have insomnia.        Objective:    Physical Exam  Constitutional: She is oriented to person, place, and time. She appears well-developed and well-nourished. No distress.  HENT:  Head: Normocephalic and atraumatic.  Right Ear: External ear normal.  Left Ear: External ear normal.  Nose: Nose normal.  Mouth/Throat: Oropharynx is clear and moist.    Eyes: Conjunctivae and EOM are normal. Pupils are equal, round, and reactive to light.  Neck: Normal range of motion. Neck supple. No JVD present. Carotid bruit is not present. No thyromegaly present.  Cardiovascular: Normal rate, regular rhythm and normal heart sounds.  No murmur heard. Pulmonary/Chest: Effort normal and breath sounds normal. No respiratory distress. She has no wheezes. She has no rales. She exhibits no tenderness.  Musculoskeletal: She exhibits no edema.       Right hand: She exhibits tenderness.       Left hand: She exhibits tenderness.       Hands: Neurological: She is alert and oriented to person, place, and time.  Psychiatric: She has a normal mood and affect. Her behavior is normal. Judgment and thought content normal.  Nursing note and vitals reviewed.   BP 132/70 (BP Location: Left Arm, Patient Position: Sitting, Cuff Size: Large)   Pulse 60   Temp 98.4 F (36.9 C) (Oral)   Resp 16   Ht 5' 4.96" (1.65 m)   Wt 172 lb 6.4 oz (78.2 kg)   SpO2 96%   BMI 28.72 kg/m  Wt Readings from Last 3 Encounters:  12/13/17 172 lb 6.4 oz (78.2 kg)  10/14/17 171 lb 4.8 oz (77.7 kg)  04/01/17 166 lb 9.6 oz (75.6 kg)   BP  Readings from Last 3 Encounters:  12/13/17 132/70  10/14/17 (!) 162/66  04/01/17 138/80     Immunization History  Administered Date(s) Administered  . Influenza Split 08/08/2012  . Influenza, High Dose Seasonal PF 08/09/2014, 07/05/2015  . PPD Test 08/27/2011, 09/04/2011, 07/13/2014, 09/23/2015  . Pneumococcal Conjugate-13 08/09/2014  . Pneumococcal Polysaccharide-23 11/17/2013  . Td 03/11/2007  . Zoster 03/11/2007    Health Maintenance  Topic Date Due  . OPHTHALMOLOGY EXAM  11/24/2015  . HEMOGLOBIN A1C  05/13/2016  . TETANUS/TDAP  12/14/2023 (Originally 03/10/2017)  . FOOT EXAM  04/01/2018  . MAMMOGRAM  05/30/2019  . Fecal DNA (Cologuard)  04/15/2020  . INFLUENZA VACCINE  Completed  . DEXA SCAN  Completed  . Hepatitis C Screening  Completed  . PNA vac Low Risk Adult  Completed    Lab Results  Component Value Date   WBC 8.0 12/13/2017   HGB 13.0 12/13/2017   HCT 37.9 12/13/2017   PLT 180.0 12/13/2017   GLUCOSE 199 (H) 12/13/2017   CHOL 135 12/13/2017   TRIG 209.0 (H) 12/13/2017   HDL 32.90 (L) 12/13/2017   LDLDIRECT 70.0 12/13/2017   LDLCALC 80 02/18/2016   ALT 17 12/13/2017   AST 17 12/13/2017   NA 136 12/13/2017   K 3.9 12/13/2017   CL 101 12/13/2017   CREATININE 0.79 12/13/2017   BUN 21 12/13/2017   CO2 29 12/13/2017   TSH 0.68 06/02/2015   INR 1.1 03/03/2009   HGBA1C 11.1 (H) 11/14/2015   MICROALBUR 1.7 06/23/2015    Lab Results  Component Value Date   TSH 0.68 06/02/2015   Lab Results  Component Value Date   WBC 8.0 12/13/2017   HGB 13.0 12/13/2017   HCT 37.9 12/13/2017   MCV 87.8 12/13/2017  PLT 180.0 12/13/2017   Lab Results  Component Value Date   NA 136 12/13/2017   K 3.9 12/13/2017   CO2 29 12/13/2017   GLUCOSE 199 (H) 12/13/2017   BUN 21 12/13/2017   CREATININE 0.79 12/13/2017   BILITOT 0.8 12/13/2017   ALKPHOS 50 12/13/2017   AST 17 12/13/2017   ALT 17 12/13/2017   PROT 6.8 12/13/2017   ALBUMIN 4.1 12/13/2017   CALCIUM 10.3  12/13/2017   ANIONGAP 8 02/18/2016   GFR 92.02 12/13/2017   Lab Results  Component Value Date   CHOL 135 12/13/2017   Lab Results  Component Value Date   HDL 32.90 (L) 12/13/2017   Lab Results  Component Value Date   LDLCALC 80 02/18/2016   Lab Results  Component Value Date   TRIG 209.0 (H) 12/13/2017   Lab Results  Component Value Date   CHOLHDL 4 12/13/2017   Lab Results  Component Value Date   HGBA1C 11.1 (H) 11/14/2015         Assessment & Plan:   Problem List Items Addressed This Visit      Unprioritized   Abscess of upper gum   Relevant Medications   amoxicillin-clavulanate (AUGMENTIN) 875-125 MG tablet   Other Relevant Orders   Ambulatory referral to Dentistry   Bunion   Relevant Orders   Ambulatory referral to Podiatry   Diabetes mellitus type II, uncontrolled ([place])    Per endo      Relevant Orders   Ambulatory referral to Podiatry   Essential hypertension    Well controlled, no changes to meds. Encouraged heart healthy diet such as the DASH diet and exercise as tolerated.       Relevant Orders   CBC with Differential/Platelet (Completed)   Comprehensive metabolic panel (Completed)   Lipid panel (Completed)   Ambulatory referral to Orthopedic Surgery   Hyperlipidemia LDL goal <100   Relevant Orders   Comprehensive metabolic panel (Completed)   Lipid panel (Completed)   Hyperlipidemia LDL goal <70    Tolerating statin, encouraged heart healthy diet, avoid trans fats, minimize simple carbs and saturated fats. Increase exercise as tolerated      Pain in both hands    Ganglion cyst Trigger finger Will refer to ortho for further eval      Relevant Orders   Ambulatory referral to Orthopedic Surgery   Preventative health care - Primary    ghm utd Check labs See AVS          I am having Janyla C. Lehan start on amoxicillin-clavulanate. I am also having her maintain her Insulin Syringe-Needle U-100, NOVOLIN N RELION, multivitamin  with minerals, Lancing Device, TAI DOC CONTROL, aspirin EC, glucose blood, (Flaxseed, Linseed, (FLAXSEED OIL PO)), GINKGO BILOBA COMPLEX PO, fluticasone, ACCU-CHEK AVIVA PLUS, ferrous sulfate, Cyanocobalamin (VITAMIN B-12 PO), vitamin E, Carboxymethylcellulose Sodium (THERATEARS OP), insulin regular, ALPRAZolam, gabapentin, levocetirizine, lisinopril, atorvastatin, alendronate, metoprolol succinate, amLODipine, and FLUoxetine.  Meds ordered this encounter  Medications  . amoxicillin-clavulanate (AUGMENTIN) 875-125 MG tablet    Sig: Take 1 tablet by mouth 2 (two) times daily.    Dispense:  20 tablet    Refill:  0    CMA served as scribe during this visit. History, Physical and Plan performed by medical provider. Documentation and orders reviewed and attested to.  Ann Held, DO

## 2017-12-13 NOTE — Assessment & Plan Note (Signed)
Tolerating statin, encouraged heart healthy diet, avoid trans fats, minimize simple carbs and saturated fats. Increase exercise as tolerated 

## 2017-12-13 NOTE — Assessment & Plan Note (Signed)
Per endo °

## 2017-12-13 NOTE — Assessment & Plan Note (Signed)
ghm utd Check labs See AVS 

## 2017-12-13 NOTE — Assessment & Plan Note (Signed)
Ganglion cyst Trigger finger Will refer to ortho for further eval

## 2017-12-17 ENCOUNTER — Encounter: Payer: Self-pay | Admitting: *Deleted

## 2017-12-17 DIAGNOSIS — E785 Hyperlipidemia, unspecified: Secondary | ICD-10-CM

## 2017-12-17 DIAGNOSIS — I10 Essential (primary) hypertension: Secondary | ICD-10-CM

## 2017-12-19 ENCOUNTER — Ambulatory Visit (INDEPENDENT_AMBULATORY_CARE_PROVIDER_SITE_OTHER): Payer: Medicare HMO

## 2017-12-19 ENCOUNTER — Encounter (INDEPENDENT_AMBULATORY_CARE_PROVIDER_SITE_OTHER): Payer: Self-pay | Admitting: Physician Assistant

## 2017-12-19 ENCOUNTER — Ambulatory Visit (INDEPENDENT_AMBULATORY_CARE_PROVIDER_SITE_OTHER): Payer: Medicare HMO | Admitting: Physician Assistant

## 2017-12-19 DIAGNOSIS — M65332 Trigger finger, left middle finger: Secondary | ICD-10-CM

## 2017-12-19 DIAGNOSIS — G5761 Lesion of plantar nerve, right lower limb: Secondary | ICD-10-CM | POA: Diagnosis not present

## 2017-12-19 DIAGNOSIS — M25541 Pain in joints of right hand: Secondary | ICD-10-CM | POA: Diagnosis not present

## 2017-12-19 DIAGNOSIS — M67431 Ganglion, right wrist: Secondary | ICD-10-CM

## 2017-12-19 MED ORDER — BUPIVACAINE HCL 0.25 % IJ SOLN
0.3300 mL | INTRAMUSCULAR | Status: AC | PRN
  Administered 2017-12-19: .33 mL

## 2017-12-19 MED ORDER — LIDOCAINE HCL 1 % IJ SOLN
1.0000 mL | INTRAMUSCULAR | Status: AC | PRN
  Administered 2017-12-19: 1 mL

## 2017-12-19 MED ORDER — METHYLPREDNISOLONE ACETATE 40 MG/ML IJ SUSP
13.3300 mg | INTRAMUSCULAR | Status: AC | PRN
  Administered 2017-12-19: 13.33 mg

## 2017-12-19 NOTE — Progress Notes (Signed)
Office Visit Note   Patient: [name]           Date of Birth: 1946-05-17           MRN: [id number] Visit Date: 12/19/2017              Requested by: [address] [place], [place] [address] [place], [place] [address] PCP: [name], [name], DO   Assessment & Plan: Visit Diagnoses:  1. Trigger middle finger of left hand   2. Ganglion cyst of dorsum of right wrist   3. Morton's neuroma of right foot     Plan: Because [name] is diabetic, we have opted to proceed with only one cortisone injection today.  She has picks the trigger finger which is the most symptomatic.  She will keep a close eye on her blood sugars over the next several days.  She will follow-up with [name] in 1 week's time for an injection to the right foot Morton's neuroma.  She will call with concerns or questions in the meantime.  Follow-Up Instructions: Return in about 1 week (around 12/26/2017).   Orders:  Orders Placed This Encounter  Procedures  . Hand/UE Inj: R long A1  . XR Hand Complete Left  . XR Hand Complete Right   No orders of the defined types were placed in this encounter.     Procedures: Hand/UE Inj: R long A1 for trigger finger on 12/19/2017 1:36 PM Indications: pain Details: 25 G needle Medications: 1 mL lidocaine 1 %; 0.33 mL bupivacaine 0.25 %; 13.33 mg methylPREDNISolone acetate 40 MG/ML      Clinical Data: No additional findings.   Subjective: Chief Complaint  Patient presents with  . Left Hand - Pain  . Right Hand - Pain  . Left Great Toe - Pain    HPI [name] is a pleasant 72 year old female new patient who presents our clinic today with bilateral hand and right great toe pain.  In regards to her right great toe, this is been bothering her for the past 4-5 weeks.  No known injury or change in activity.  The pain is between the first and second metatarsal heads.  The only time she gets the pain is when she is putting pressure on her foot.  She does  occasionally note burning to the top of her foot.  In regards to her hands, she has noticed triggering to the left hand long finger.  This began approximately 2 months ago.  First started out with triggering but then became painful.  No previous history of trigger finger.  Regards to the right hand, she notes a painful cyst to the dorsum of her wrist.  No injury there.  No fluctuation in size.  This only hurts intermittently.  There are no specific aggravators.  No numbness tingling burning.  Of note she is a diabetic and has AM CBGs of around 250.  Review of Systems as detailed in HPI.  All others reviewed and are negative.   Objective: Vital Signs: There were no vitals taken for this visit.  Physical Exam well-developed well-nourished female no acute distress.  Alert and oriented x3.  Ortho Exam examination of her right foot reveals marked tenderness between the first and second metatarsal heads.  Examination of her left hand long finger reveals a markedly tender and palpable nodule to the A1 pulley.  There is triggering as well.  Examination of the right hand reveals a hard, pea-sized nodule  to the dorsum of the wrist.  This is not tender to palpation.  She is rest intact distally.  Specialty Comments:  No specialty comments available.  Imaging: Xr Hand Complete Left  Result Date: 12/19/2017 No structural abnormalities noted  Xr Hand Complete Right  Result Date: 12/19/2017 No structural abnormalities noted    PMFS History: Patient Active Problem List   Diagnosis Date Noted  . Trigger middle finger of left hand 12/19/2017  . Ganglion cyst of dorsum of right wrist 12/19/2017  . Morton's neuroma of right foot 12/19/2017  . Preventative health care 12/13/2017  . Pain in both hands 12/13/2017  . Hyperlipidemia LDL goal <100 12/13/2017  . Abscess of upper gum 12/13/2017  . Spinal stenosis of cervical region 09/25/2016  . Cervical radiculopathy 09/06/2016  . Dyspnea 02/18/2016  .  Chest pain 02/18/2016  . HLD (hyperlipidemia) 02/18/2016  . CAD in native artery 02/18/2016  . RLS (restless legs syndrome) 02/18/2016  . Pancreatitis 02/18/2016  . SOB (shortness of breath) 02/18/2016  . Uncontrolled type 2 diabetes mellitus with complication ([facility])   . Tobacco abuse   . Osteoarthritis of right knee 07/05/2015  . Smoking 07/02/2013  . Acute upper respiratory infections of unspecified site 07/02/2013  . Flu vaccine need 07/02/2013  . Multinodular goiter 04/16/2013  . Bunion 07/07/2012  . BACK PAIN, THORACIC REGION, RIGHT 05/24/2009  . IRON DEFICIENCY 03/25/2009  . GERD 03/25/2009  . WEIGHT LOSS 03/25/2009  . SINUSITIS- ACUTE-NOS 11/25/2008  . Hyperlipidemia LDL goal <70 10/07/2008  . ACUTE PANCREATITIS 09/07/2008  . OVARIAN CYST 09/07/2008  . KNEE PAIN, RIGHT 05/24/2008  . CARPAL TUNNEL SYNDROME, RIGHT 05/12/2008  . Acute pain of right shoulder 04/02/2008  . ROTATOR CUFF REPAIR, RIGHT, HX OF 03/29/2008  . GOITER, MULTINODULAR 12/26/2007  . Depression 07/07/2007  . HIP PAIN, RIGHT 03/11/2007  . RESTLESS LEG SYNDROME, HX OF 03/11/2007  . Diabetes mellitus type II, uncontrolled ([facility]) 03/07/2007  . Essential hypertension 03/07/2007   Past Medical History:  Diagnosis Date  . Blockage of coronary artery of heart ([facility])   . Depression   . Diabetes mellitus   . Hyperlipidemia   . Hypertension   . Pancreatitis   . Restless leg syndrome     Family History  Problem Relation Age of Onset  . Heart attack Father   . Lung cancer Unknown   . Thyroid cancer Unknown   . Uterine cancer Unknown   . Breast cancer Sister   . Cancer Other        lung,thyroid,breast,uterine  . Heart attack Sister   . Heart attack Brother [phone]    Past Surgical History:  Procedure Laterality Date  . CATARACT EXTRACTION, BILATERAL    . CORONARY ANGIOPLASTY WITH STENT PLACEMENT    . FOOT SURGERY Right    cyst removal  . ROTATOR CUFF REPAIR     right  . TUBAL LIGATION     Social  History   Occupational History  . Not on file  Tobacco Use  . Smoking status: Current Some Day Smoker    Packs/day: 0.20    Types: Cigarettes  . Smokeless tobacco: Never Used  Substance and Sexual Activity  . Alcohol use: No  . Drug use: No  . Sexual activity: Not on file

## 2018-01-02 ENCOUNTER — Ambulatory Visit: Payer: Medicare HMO | Admitting: Podiatry

## 2018-01-02 ENCOUNTER — Ambulatory Visit (INDEPENDENT_AMBULATORY_CARE_PROVIDER_SITE_OTHER): Payer: Medicare HMO

## 2018-01-02 DIAGNOSIS — M21619 Bunion of unspecified foot: Secondary | ICD-10-CM

## 2018-01-02 DIAGNOSIS — E119 Type 2 diabetes mellitus without complications: Secondary | ICD-10-CM

## 2018-01-02 DIAGNOSIS — M21611 Bunion of right foot: Secondary | ICD-10-CM

## 2018-01-02 DIAGNOSIS — M2011 Hallux valgus (acquired), right foot: Secondary | ICD-10-CM

## 2018-01-02 DIAGNOSIS — E1151 Type 2 diabetes mellitus with diabetic peripheral angiopathy without gangrene: Secondary | ICD-10-CM | POA: Diagnosis not present

## 2018-01-02 NOTE — Progress Notes (Signed)
Subjective:  Patient ID: [name], female    DOB: Dec 13, 1945,  MRN: [id number]  Chief Complaint  Patient presents with  . Diabetes    diabetic foot exam -last A1C 10  . Bunions    Right bunion has become painful - Left  repaired with surgery in 2014   71 y.o. female presents with the above complaint.  Reports right foot painful bunion has been years duration.  Has a history of a left foot bunion which was fixed surgically 2014 for which she no longer has any pain.  Diabetes with last A1c of 10.  Denies nausea vomiting fever chills denies numbness and tingling in the feet.  Past Medical History:  Diagnosis Date  . Blockage of coronary artery of heart ([place])   . Depression   . Diabetes mellitus   . Hyperlipidemia   . Hypertension   . Pancreatitis   . Restless leg syndrome    Past Surgical History:  Procedure Laterality Date  . CATARACT EXTRACTION, BILATERAL    . CORONARY ANGIOPLASTY WITH STENT PLACEMENT    . FOOT SURGERY Right    cyst removal  . ROTATOR CUFF REPAIR     right  . TUBAL LIGATION      Current Outpatient Medications:  .  alendronate (FOSAMAX) 70 MG tablet, take 1 tablet by mouth every week TAKE WITH FULL GLASS OF WATER ON AN EMPTY STOMACH, Disp: 4 tablet, Rfl: 12 .  ALPRAZolam (XANAX) 0.25 MG tablet, Take 1 tablet by mouth three times a day if needed, Disp: 60 tablet, Rfl: 0 .  amLODipine (NORVASC) 10 MG tablet, take 1 tablet by mouth once daily, Disp: 30 tablet, Rfl: 5 .  amoxicillin-clavulanate (AUGMENTIN) 875-125 MG tablet, Take 1 tablet by mouth 2 (two) times daily., Disp: 20 tablet, Rfl: 0 .  aspirin EC 81 MG tablet, Take 81 mg by mouth daily., Disp: , Rfl:  .  atorvastatin (LIPITOR) 40 MG tablet, take 1 tablet by mouth once daily, Disp: 90 tablet, Rfl: 1 .  Blood Glucose Calibration (TAI DOC CONTROL) NORMAL SOLN, , Disp: , Rfl:  .  Blood Glucose Monitoring Suppl (ACCU-CHEK AVIVA PLUS) w/Device KIT, Check blood sugar daily. E11.9, Disp: 1 kit, Rfl: 0 .   Carboxymethylcellulose Sodium (THERATEARS OP), Place 1 drop into both eyes daily as needed (dry eyes)., Disp: , Rfl:  .  Cyanocobalamin (VITAMIN B-12 PO), Take 1 tablet by mouth daily., Disp: , Rfl:  .  ferrous sulfate 325 (65 FE) MG tablet, Take 325 mg by mouth daily with breakfast., Disp: , Rfl:  .  Flaxseed, Linseed, (FLAXSEED OIL PO), Take 1 capsule by mouth 2 (two) times daily. , Disp: , Rfl:  .  FLUoxetine (PROZAC) 20 MG capsule, take 1 capsule by mouth once daily, Disp: 30 capsule, Rfl: 5 .  fluticasone (FLONASE) 50 MCG/ACT nasal spray, Place 2 sprays into both nostrils daily. (Patient taking differently: Place 2 sprays into both nostrils daily as needed. ), Disp: 16 g, Rfl: 6 .  gabapentin (NEURONTIN) 300 MG capsule, Take 1 capsule by mouth at night for 7 days and then one by mouth twice per day. He is tolerating may take up to 3 times per day., Disp: 180 capsule, Rfl: 1 .  GINKGO BILOBA COMPLEX PO, Take 1 tablet by mouth daily. , Disp: , Rfl:  .  glucose blood (ACCU-CHEK AVIVA) test strip, Check blood sugar four times daily. E11.9, Disp: 400 each, Rfl: 3 .  insulin regular (NOVOLIN R RELION) 100  units/mL injection, Inject 0.3 mLs (30 Units total) into the skin daily as needed for high blood sugar (at noon if CBG >200 and will be eating)., Disp: 10 mL, Rfl:  .  Insulin Syringe-Needle U-100 (BD INSULIN SYRINGE ULTRAFINE) 31G X 5/16" 0.3 ML MISC, 10 Units by Does not apply route 3 (three) times daily before meals.  , Disp: , Rfl:  .  Lancet Devices (LANCING DEVICE) MISC, , Disp: , Rfl:  .  levocetirizine (XYZAL) 5 MG tablet, take 1 tablet by mouth every evening, Disp: 30 tablet, Rfl: 5 .  lisinopril (PRINIVIL,ZESTRIL) 10 MG tablet, take 1 tablet by mouth once daily, Disp: 90 tablet, Rfl: 3 .  metoprolol succinate (TOPROL-XL) 50 MG 24 hr tablet, take 1 tablet by mouth once daily with food, Disp: 90 tablet, Rfl: 1 .  Multiple Vitamin (MULTIVITAMIN WITH MINERALS) TABS, Take 1 tablet by mouth  daily., Disp: , Rfl:  .  NOVOLIN N RELION 100 UNIT/ML injection, Inject 30-60 Units into the skin 2 (two) times daily. Inject 40 units subcutaneously before breakfast and 60 units at bedtime, Disp: , Rfl:  .  vitamin E 400 UNIT capsule, Take 400 Units by mouth daily., Disp: , Rfl:   Allergies  Allergen Reactions  . Levemir [Insulin Detemir] Hives and Swelling  . Metformin And Related Other (See Comments)    Sick on stomach, sweaty, throw up, pain   Review of Systems Objective:  There were no vitals filed for this visit. General AA&O x3. Normal mood and affect.  Vascular Dorsalis pedis and posterior tibial pulses  present 1+ and absent bilaterally  Capillary refill normal to all digits. Pedal hair growth normal.  Neurologic Epicritic sensation grossly present.  Dermatologic No open lesions. Interspaces clear of maceration. Nails well groomed and normal in appearance.  Orthopedic: MMT 5/5 in dorsiflexion, plantarflexion, inversion, and eversion. Normal joint ROM without pain or crepitus. 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  Left first MPJ rectus   Assessment & Plan:  Patient was evaluated and treated and all questions answered.  Diabetes with PAD, HAV right foot -Discussed conservative versus surgical therapy with patient for the painful right foot bunion.  Would recommend avoiding surgical correction at this time due to elevated A1c and slightly decreased pulses.  Patient would require noninvasive vascular studies prior to procedure.  In the meantime will benefit from offloading with tube foam and diabetic shoes.  Patient will follow-up for appointment for fabrication of diabetic shoes.  Offered injection today declined.  Return in about 6 weeks (around 02/13/2018) for Diabetic Foot Care.

## 2018-02-13 ENCOUNTER — Ambulatory Visit: Payer: Medicare HMO | Admitting: Podiatry

## 2018-02-26 ENCOUNTER — Other Ambulatory Visit: Payer: Self-pay | Admitting: Family Medicine

## 2018-03-13 ENCOUNTER — Ambulatory Visit: Payer: Medicare HMO | Admitting: Podiatry

## 2018-04-02 ENCOUNTER — Ambulatory Visit: Payer: Self-pay | Admitting: *Deleted

## 2018-04-02 NOTE — Progress Notes (Deleted)
Subjective:   [name] is a 72 y.o. female who presents for Medicare Annual (Subsequent) preventive examination.  Review of Systems: No ROS.  Medicare Wellness Visit. Additional risk factors are reflected in the social history.   Sleep patterns:  Home Safety/Smoke Alarms: Feels safe in home. Smoke alarms in place.  Living environment; residence and Firearm Safety:   Female:      Mammo- utd      Dexa scan- utd       CCS-Cologuard 04/15/17-neg     Objective:     Vitals: There were no vitals taken for this visit.  There is no height or weight on file to calculate BMI.  Advanced Directives 10/14/2017 04/01/2017 08/01/2016 09/29/2015 12/31/2014  Does Patient Have a Medical Advance Directive? _0   Would patient like information on creating a medical advance directive? - Yes (MAU/Ambulatory/Procedural Areas - Information given) No - patient declined information Yes - Educational materials given No - patient declined information    Tobacco Social History   Tobacco Use  Smoking Status Current Some Day Smoker  . Packs/day: 0.20  . Types: Cigarettes  Smokeless Tobacco Never Used     Ready to quit: Not Answered Counseling given: Not Answered   Clinical Intake:                       Past Medical History:  Diagnosis Date  . Blockage of coronary artery of heart ([place])   . Depression   . Diabetes mellitus   . Hyperlipidemia   . Hypertension   . Pancreatitis   . Restless leg syndrome    Past Surgical History:  Procedure Laterality Date  . CATARACT EXTRACTION, BILATERAL    . CORONARY ANGIOPLASTY WITH STENT PLACEMENT    . FOOT SURGERY Right    cyst removal  . ROTATOR CUFF REPAIR     right  . TUBAL LIGATION     Family History  Problem Relation Age of Onset  . Heart attack Father   . Lung cancer Unknown   . Thyroid cancer Unknown   . Uterine cancer Unknown   . Breast cancer Sister   . Cancer Other        lung,thyroid,breast,uterine  .  Heart attack Sister   . Heart attack Brother 72   Social History   Socioeconomic History  . Marital status: Married    Spouse name: Not on file  . Number of children: Not on file  . Years of education: Not on file  . Highest education level: Not on file  Occupational History  . Not on file  Social Needs  . Financial resource strain: Not on file  . Food insecurity:    Worry: Not on file    Inability: Not on file  . Transportation needs:    Medical: Not on file    Non-medical: Not on file  Tobacco Use  . Smoking status: Current Some Day Smoker    Packs/day: 0.20    Types: Cigarettes  . Smokeless tobacco: Never Used  Substance and Sexual Activity  . Alcohol use: No  . Drug use: No  . Sexual activity: Not on file  Lifestyle  . Physical activity:    Days per week: Not on file    Minutes per session: Not on file  . Stress: Not on file  Relationships  . Social connections:    Talks on phone: Not on file    Gets together: Not  on file    Attends religious service: Not on file    Active member of club or organization: Not on file    Attends meetings of clubs or organizations: Not on file    Relationship status: Not on file  Other Topics Concern  . Not on file  Social History Narrative   Exercise: seldom   Caffeine use: seldom    Outpatient Encounter Medications as of 04/08/2018  Medication Sig  . alendronate (FOSAMAX) 70 MG tablet take 1 tablet by mouth every week TAKE WITH FULL GLASS OF WATER ON AN EMPTY STOMACH  . ALPRAZolam (XANAX) 0.25 MG tablet Take 1 tablet by mouth three times a day if needed  . amLODipine (NORVASC) 10 MG tablet take 1 tablet by mouth once daily  . amoxicillin-clavulanate (AUGMENTIN) 875-125 MG tablet Take 1 tablet by mouth 2 (two) times daily.  [name] aspirin EC 81 MG tablet Take 81 mg by mouth daily.  [name] atorvastatin (LIPITOR) 40 MG tablet take 1 tablet by mouth once daily  . Blood Glucose Calibration (TAI DOC CONTROL) NORMAL SOLN   . Blood Glucose  Monitoring Suppl (ACCU-CHEK AVIVA PLUS) w/Device KIT Check blood sugar daily. E11.9  . Carboxymethylcellulose Sodium (THERATEARS OP) Place 1 drop into both eyes daily as needed (dry eyes).  . Cyanocobalamin (VITAMIN B-12 PO) Take 1 tablet by mouth daily.  . ferrous sulfate 325 (65 FE) MG tablet Take 325 mg by mouth daily with breakfast.  . Flaxseed, Linseed, (FLAXSEED OIL PO) Take 1 capsule by mouth 2 (two) times daily.   [name] FLUoxetine (PROZAC) 20 MG capsule take 1 capsule by mouth once daily  . fluticasone (FLONASE) 50 MCG/ACT nasal spray Place 2 sprays into both nostrils daily. (Patient taking differently: Place 2 sprays into both nostrils daily as needed. )  . gabapentin (NEURONTIN) 300 MG capsule Take 1 capsule by mouth at night for 7 days and then one by mouth twice per day. He is tolerating may take up to 3 times per day.  [name] GINKGO BILOBA COMPLEX PO Take 1 tablet by mouth daily.   [name] glucose blood (ACCU-CHEK AVIVA) test strip Check blood sugar four times daily. E11.9  . insulin regular (NOVOLIN R RELION) 100 units/mL injection Inject 0.3 mLs (30 Units total) into the skin daily as needed for high blood sugar (at noon if CBG >200 and will be eating).  . Insulin Syringe-Needle U-100 (BD INSULIN SYRINGE ULTRAFINE) 31G X 5/16" 0.3 ML MISC 10 Units by Does not apply route 3 (three) times daily before meals.    Elmore Guise Devices (LANCING DEVICE) MISC   . levocetirizine (XYZAL) 5 MG tablet take 1 tablet by mouth every evening  . lisinopril (PRINIVIL,ZESTRIL) 10 MG tablet TAKE 1 TABLET BY MOUTH ONCE DAILY  . metoprolol succinate (TOPROL-XL) 50 MG 24 hr tablet take 1 tablet by mouth once daily with food  . Multiple Vitamin (MULTIVITAMIN WITH MINERALS) TABS Take 1 tablet by mouth daily.  [name] NOVOLIN N RELION 100 UNIT/ML injection Inject 30-60 Units into the skin 2 (two) times daily. Inject 40 units subcutaneously before breakfast and 60 units at bedtime  . vitamin E 400 UNIT capsule Take 400 Units by mouth  daily.   No facility-administered encounter medications on file as of 04/08/2018.     Activities of Daily Living No flowsheet data found.  Patient Care Team: Carollee Herter, Alferd Apa, DO as PCP - General Jacelyn Pi, MD as Consulting Physician (Endocrinology) Ashok Pall, MD as Consulting Physician (Neurosurgery)  Monna Fam, MD as Consulting Physician (Ophthalmology)    Assessment:   This is a routine wellness examination for [place]. Physical assessment deferred to PCP.  Exercise Activities and Dietary recommendations   Diet (meal preparation, eat out, water intake, caffeinated beverages, dairy products, fruits and vegetables): {Desc; diets:16563} Breakfast: Lunch:  Dinner:      Goals    None      Fall Risk Fall Risk  04/01/2017 10/11/2016 11/14/2015 11/29/2014 11/17/2013  Falls in the past year? _0      Depression Screen PHQ 2/9 Scores 04/01/2017 10/11/2016 11/14/2015 11/29/2014  PHQ - 2 Score 0 0 0 0     Cognitive Function MMSE - Mini Mental State Exam 04/01/2017  Orientation to time 5  Orientation to Place 5  Registration 3  Attention/ Calculation 5  Recall 2  Language- name 2 objects 2  Language- repeat 1  Language- follow 3 step command 3  Language- read & follow direction 1  Write a sentence 1  Copy design 1  Total score 29        Immunization History  Administered Date(s) Administered  . Influenza Split 08/08/2012  . Influenza, High Dose Seasonal PF 08/09/2014, 07/05/2015  . PPD Test 08/27/2011, 09/04/2011, 07/13/2014, 09/23/2015  . Pneumococcal Conjugate-13 08/09/2014  . Pneumococcal Polysaccharide-23 11/17/2013  . Td 03/11/2007  . Zoster 03/11/2007    Screening Tests Health Maintenance  Topic Date Due  . OPHTHALMOLOGY EXAM  11/24/2015  . HEMOGLOBIN A1C  05/13/2016  . FOOT EXAM  04/01/2018  . TETANUS/TDAP  12/14/2023 (Originally 03/10/2017)  . INFLUENZA VACCINE  05/08/2018  . MAMMOGRAM  05/30/2019  . Fecal DNA (Cologuard)   04/15/2020  . DEXA SCAN  Completed  . Hepatitis C Screening  Completed  . PNA vac Low Risk Adult  Completed      Plan:   ***   I have personally reviewed and noted the following in the patient's chart:   . Medical and social history . Use of alcohol, tobacco or illicit drugs  . Current medications and supplements . Functional ability and status . Nutritional status . Physical activity . Advanced directives . List of other physicians . Hospitalizations, surgeries, and ER visits in previous 12 months . Vitals . Screenings to include cognitive, depression, and falls . Referrals and appointments  In addition, I have reviewed and discussed with patient certain preventive protocols, quality metrics, and best practice recommendations. A written personalized care plan for preventive services as well as general preventive health recommendations were provided to patient.     Shela Nevin, [place]  04/02/2018

## 2018-04-08 ENCOUNTER — Ambulatory Visit: Payer: Medicare HMO | Admitting: *Deleted

## 2018-04-09 NOTE — Progress Notes (Deleted)
Subjective:   [name] is a 72 y.o. female who presents for Medicare Annual (Subsequent) preventive examination.  Review of Systems: No ROS.  Medicare Wellness Visit. Additional risk factors are reflected in the social history.    Sleep patterns:  Home Safety/Smoke Alarms: Feels safe in home. Smoke alarms in place.  Living environment; residence and Firearm Safety:    Female:   Mammo-utd       Dexa scan-utd        CCS- Cologuard 04/15/17-negative     Objective:     Vitals: There were no vitals taken for this visit.  There is no height or weight on file to calculate BMI.  Advanced Directives 10/14/2017 04/01/2017 08/01/2016 09/29/2015 12/31/2014  Does Patient Have a Medical Advance Directive? No No No No No  Would patient like information on creating a medical advance directive? - Yes (MAU/Ambulatory/Procedural Areas - Information given) No - patient declined information Yes - Educational materials given No - patient declined information    Tobacco Social History   Tobacco Use  Smoking Status Current Some Day Smoker  . Packs/day: 0.20  . Types: Cigarettes  Smokeless Tobacco Never Used     Ready to quit: Not Answered Counseling given: Not Answered   Clinical Intake:                       Past Medical History:  Diagnosis Date  . Blockage of coronary artery of heart ([place])   . Depression   . Diabetes mellitus   . Hyperlipidemia   . Hypertension   . Pancreatitis   . Restless leg syndrome    Past Surgical History:  Procedure Laterality Date  . CATARACT EXTRACTION, BILATERAL    . CORONARY ANGIOPLASTY WITH STENT PLACEMENT    . FOOT SURGERY Right    cyst removal  . ROTATOR CUFF REPAIR     right  . TUBAL LIGATION     Family History  Problem Relation Age of Onset  . Heart attack Father   . Lung cancer Unknown   . Thyroid cancer Unknown   . Uterine cancer Unknown   . Breast cancer Sister   . Cancer Other        lung,thyroid,breast,uterine    . Heart attack Sister   . Heart attack Brother 106   Social History   Socioeconomic History  . Marital status: Married    Spouse name: Not on file  . Number of children: Not on file  . Years of education: Not on file  . Highest education level: Not on file  Occupational History  . Not on file  Social Needs  . Financial resource strain: Not on file  . Food insecurity:    Worry: Not on file    Inability: Not on file  . Transportation needs:    Medical: Not on file    Non-medical: Not on file  Tobacco Use  . Smoking status: Current Some Day Smoker    Packs/day: 0.20    Types: Cigarettes  . Smokeless tobacco: Never Used  Substance and Sexual Activity  . Alcohol use: No  . Drug use: No  . Sexual activity: Not on file  Lifestyle  . Physical activity:    Days per week: Not on file    Minutes per session: Not on file  . Stress: Not on file  Relationships  . Social connections:    Talks on phone: Not on file    Gets together:  Not on file    Attends religious service: Not on file    Active member of club or organization: Not on file    Attends meetings of clubs or organizations: Not on file    Relationship status: Not on file  Other Topics Concern  . Not on file  Social History Narrative   Exercise: seldom   Caffeine use: seldom    Outpatient Encounter Medications as of 04/15/2018  Medication Sig  . alendronate (FOSAMAX) 70 MG tablet take 1 tablet by mouth every week TAKE WITH FULL GLASS OF WATER ON AN EMPTY STOMACH  . ALPRAZolam (XANAX) 0.25 MG tablet Take 1 tablet by mouth three times a day if needed  . amLODipine (NORVASC) 10 MG tablet take 1 tablet by mouth once daily  . amoxicillin-clavulanate (AUGMENTIN) 875-125 MG tablet Take 1 tablet by mouth 2 (two) times daily.  [name] aspirin EC 81 MG tablet Take 81 mg by mouth daily.  [name] atorvastatin (LIPITOR) 40 MG tablet take 1 tablet by mouth once daily  . Blood Glucose Calibration (TAI DOC CONTROL) NORMAL SOLN   . Blood  Glucose Monitoring Suppl (ACCU-CHEK AVIVA PLUS) w/Device KIT Check blood sugar daily. E11.9  . Carboxymethylcellulose Sodium (THERATEARS OP) Place 1 drop into both eyes daily as needed (dry eyes).  . Cyanocobalamin (VITAMIN B-12 PO) Take 1 tablet by mouth daily.  . ferrous sulfate 325 (65 FE) MG tablet Take 325 mg by mouth daily with breakfast.  . Flaxseed, Linseed, (FLAXSEED OIL PO) Take 1 capsule by mouth 2 (two) times daily.   [name] FLUoxetine (PROZAC) 20 MG capsule take 1 capsule by mouth once daily  . fluticasone (FLONASE) 50 MCG/ACT nasal spray Place 2 sprays into both nostrils daily. (Patient taking differently: Place 2 sprays into both nostrils daily as needed. )  . gabapentin (NEURONTIN) 300 MG capsule Take 1 capsule by mouth at night for 7 days and then one by mouth twice per day. He is tolerating may take up to 3 times per day.  [name] GINKGO BILOBA COMPLEX PO Take 1 tablet by mouth daily.   [name] glucose blood (ACCU-CHEK AVIVA) test strip Check blood sugar four times daily. E11.9  . insulin regular (NOVOLIN R RELION) 100 units/mL injection Inject 0.3 mLs (30 Units total) into the skin daily as needed for high blood sugar (at noon if CBG >200 and will be eating).  . Insulin Syringe-Needle U-100 (BD INSULIN SYRINGE ULTRAFINE) 31G X 5/16" 0.3 ML MISC 10 Units by Does not apply route 3 (three) times daily before meals.    Elmore Guise Devices (LANCING DEVICE) MISC   . levocetirizine (XYZAL) 5 MG tablet take 1 tablet by mouth every evening  . lisinopril (PRINIVIL,ZESTRIL) 10 MG tablet TAKE 1 TABLET BY MOUTH ONCE DAILY  . metoprolol succinate (TOPROL-XL) 50 MG 24 hr tablet take 1 tablet by mouth once daily with food  . Multiple Vitamin (MULTIVITAMIN WITH MINERALS) TABS Take 1 tablet by mouth daily.  [name] NOVOLIN N RELION 100 UNIT/ML injection Inject 30-60 Units into the skin 2 (two) times daily. Inject 40 units subcutaneously before breakfast and 60 units at bedtime  . vitamin E 400 UNIT capsule Take 400 Units  by mouth daily.   No facility-administered encounter medications on file as of 04/15/2018.     Activities of Daily Living No flowsheet data found.  Patient Care Team: Carollee Herter, Alferd Apa, DO as PCP - General Jacelyn Pi, MD as Consulting Physician (Endocrinology) Ashok Pall, MD as Consulting Physician (  Neurosurgery) Monna Fam, MD as Consulting Physician (Ophthalmology)    Assessment:   This is a routine wellness examination for [place]. Physical assessment deferred to PCP.  Exercise Activities and Dietary recommendations   Diet (meal preparation, eat out, water intake, caffeinated beverages, dairy products, fruits and vegetables): {Desc; diets:16563} Breakfast: Lunch:  Dinner:      Goals    None      Fall Risk Fall Risk  04/01/2017 10/11/2016 11/14/2015 11/29/2014 11/17/2013  Falls in the past year? No No No No No    Depression Screen PHQ 2/9 Scores 04/01/2017 10/11/2016 11/14/2015 11/29/2014  PHQ - 2 Score 0 0 0 0     Cognitive Function MMSE - Mini Mental State Exam 04/01/2017  Orientation to time 5  Orientation to Place 5  Registration 3  Attention/ Calculation 5  Recall 2  Language- name 2 objects 2  Language- repeat 1  Language- follow 3 step command 3  Language- read & follow direction 1  Write a sentence 1  Copy design 1  Total score 29        Immunization History  Administered Date(s) Administered  . Influenza Split 08/08/2012  . Influenza, High Dose Seasonal PF 08/09/2014, 07/05/2015  . PPD Test 08/27/2011, 09/04/2011, 07/13/2014, 09/23/2015  . Pneumococcal Conjugate-13 08/09/2014  . Pneumococcal Polysaccharide-23 11/17/2013  . Td 03/11/2007  . Zoster 03/11/2007    Screening Tests Health Maintenance  Topic Date Due  . OPHTHALMOLOGY EXAM  11/24/2015  . HEMOGLOBIN A1C  05/13/2016  . FOOT EXAM  04/01/2018  . TETANUS/TDAP  12/14/2023 (Originally 03/10/2017)  . INFLUENZA VACCINE  05/08/2018  . MAMMOGRAM  05/30/2019  . Fecal DNA (Cologuard)   04/15/2020  . DEXA SCAN  Completed  . Hepatitis C Screening  Completed  . PNA vac Low Risk Adult  Completed  0  0  Plan:   ***   I have personally reviewed and noted the following in the patient's chart:   . Medical and social history . Use of alcohol, tobacco or illicit drugs  . Current medications and supplements . Functional ability and status . Nutritional status . Physical activity . Advanced directives . List of other physicians . Hospitalizations, surgeries, and ER visits in previous 12 months . Vitals . Scree nings to include cognitive, depression, and falls . Referrals and appointments  In addition, I have reviewed and discussed with patient certain preventive protocols, quality metrics, and best practice recommendations. A written personalized care plan for preventive services as well as general preventive health recommendations were provided to patient.     Shela Nevin, [place]  04/09/2018

## 2018-04-15 ENCOUNTER — Ambulatory Visit: Payer: Medicare HMO | Admitting: *Deleted

## 2018-04-17 ENCOUNTER — Ambulatory Visit: Payer: Medicare HMO | Admitting: Podiatry

## 2018-04-17 NOTE — Progress Notes (Deleted)
Subjective:   [name] is a 72 y.o. female who presents for Medicare Annual (Subsequent) preventive examination.  Review of Systems: No ROS.  Medicare Wellness Visit. Additional risk factors are reflected in the social history.   Sleep patterns: Home Safety/Smoke Alarms: Feels safe in home. Smoke alarms in place.  Living environment; residence and Firearm Safety:    Female:     Mammo-  utd     Dexa scan-  utd      CCS- Cologuard 04/15/17- neg     Objective:     Vitals: There were no vitals taken for this visit.  There is no height or weight on file to calculate BMI.  Advanced Directives 10/14/2017 04/01/2017 08/01/2016 09/29/2015 12/31/2014  Does Patient Have a Medical Advance Directive? No No No No No  Would patient like information on creating a medical advance directive? - Yes (MAU/Ambulatory/Procedural Areas - Information given) No - patient declined information Yes - Educational materials given No - patient declined information    Tobacco Social History   Tobacco Use  Smoking Status Current Some Day Smoker  . Packs/day: 0.20  . Types: Cigarettes  Smokeless Tobacco Never Used     Ready to quit: Not Answered Counseling given: Not Answered   Clinical Intake:                       Past Medical History:  Diagnosis Date  . Blockage of coronary artery of heart ([place])   . Depression   . Diabetes mellitus   . Hyperlipidemia   . Hypertension   . Pancreatitis   . Restless leg syndrome    Past Surgical History:  Procedure Laterality Date  . CATARACT EXTRACTION, BILATERAL    . CORONARY ANGIOPLASTY WITH STENT PLACEMENT    . FOOT SURGERY Right    cyst removal  . ROTATOR CUFF REPAIR     right  . TUBAL LIGATION     Family History  Problem Relation Age of Onset  . Heart attack Father   . Lung cancer Unknown   . Thyroid cancer Unknown   . Uterine cancer Unknown   . Breast cancer Sister   . Cancer Other        lung,thyroid,breast,uterine  .  Heart attack Sister   . Heart attack Brother 48   Social History   Socioeconomic History  . Marital status: Married    Spouse name: Not on file  . Number of children: Not on file  . Years of education: Not on file  . Highest education level: Not on file  Occupational History  . Not on file  Social Needs  . Financial resource strain: Not on file  . Food insecurity:    Worry: Not on file    Inability: Not on file  . Transportation needs:    Medical: Not on file    Non-medical: Not on file  Tobacco Use  . Smoking status: Current Some Day Smoker    Packs/day: 0.20    Types: Cigarettes  . Smokeless tobacco: Never Used  Substance and Sexual Activity  . Alcohol use: No  . Drug use: No  . Sexual activity: Not on file  Lifestyle  . Physical activity:    Days per week: Not on file    Minutes per session: Not on file  . Stress: Not on file  Relationships  . Social connections:    Talks on phone: Not on file    Gets together:  Not on file    Attends religious service: Not on file    Active member of club or organization: Not on file    Attends meetings of clubs or organizations: Not on file    Relationship status: Not on file  Other Topics Concern  . Not on file  Social History Narrative   Exercise: seldom   Caffeine use: seldom    Outpatient Encounter Medications as of 04/24/2018  Medication Sig  . alendronate (FOSAMAX) 70 MG tablet take 1 tablet by mouth every week TAKE WITH FULL GLASS OF WATER ON AN EMPTY STOMACH  . ALPRAZolam (XANAX) 0.25 MG tablet Take 1 tablet by mouth three times a day if needed  . amLODipine (NORVASC) 10 MG tablet take 1 tablet by mouth once daily  . amoxicillin-clavulanate (AUGMENTIN) 875-125 MG tablet Take 1 tablet by mouth 2 (two) times daily.  [name] aspirin EC 81 MG tablet Take 81 mg by mouth daily.  [name] atorvastatin (LIPITOR) 40 MG tablet take 1 tablet by mouth once daily  . Blood Glucose Calibration (TAI DOC CONTROL) NORMAL SOLN   . Blood Glucose  Monitoring Suppl (ACCU-CHEK AVIVA PLUS) w/Device KIT Check blood sugar daily. E11.9  . Carboxymethylcellulose Sodium (THERATEARS OP) Place 1 drop into both eyes daily as needed (dry eyes).  . Cyanocobalamin (VITAMIN B-12 PO) Take 1 tablet by mouth daily.  . ferrous sulfate 325 (65 FE) MG tablet Take 325 mg by mouth daily with breakfast.  . Flaxseed, Linseed, (FLAXSEED OIL PO) Take 1 capsule by mouth 2 (two) times daily.   [name] FLUoxetine (PROZAC) 20 MG capsule take 1 capsule by mouth once daily  . fluticasone (FLONASE) 50 MCG/ACT nasal spray Place 2 sprays into both nostrils daily. (Patient taking differently: Place 2 sprays into both nostrils daily as needed. )  . gabapentin (NEURONTIN) 300 MG capsule Take 1 capsule by mouth at night for 7 days and then one by mouth twice per day. He is tolerating may take up to 3 times per day.  [name] GINKGO BILOBA COMPLEX PO Take 1 tablet by mouth daily.   [name] glucose blood (ACCU-CHEK AVIVA) test strip Check blood sugar four times daily. E11.9  . insulin regular (NOVOLIN R RELION) 100 units/mL injection Inject 0.3 mLs (30 Units total) into the skin daily as needed for high blood sugar (at noon if CBG >200 and will be eating).  . Insulin Syringe-Needle U-100 (BD INSULIN SYRINGE ULTRAFINE) 31G X 5/16" 0.3 ML MISC 10 Units by Does not apply route 3 (three) times daily before meals.    Elmore Guise Devices (LANCING DEVICE) MISC   . levocetirizine (XYZAL) 5 MG tablet take 1 tablet by mouth every evening  . lisinopril (PRINIVIL,ZESTRIL) 10 MG tablet TAKE 1 TABLET BY MOUTH ONCE DAILY  . metoprolol succinate (TOPROL-XL) 50 MG 24 hr tablet take 1 tablet by mouth once daily with food  . Multiple Vitamin (MULTIVITAMIN WITH MINERALS) TABS Take 1 tablet by mouth daily.  [name] NOVOLIN N RELION 100 UNIT/ML injection Inject 30-60 Units into the skin 2 (two) times daily. Inject 40 units subcutaneously before breakfast and 60 units at bedtime  . vitamin E 400 UNIT capsule Take 400 Units by mouth  daily.   No facility-administered encounter medications on file as of 04/24/2018.     Activities of Daily Living No flowsheet data found.  Patient Care Team: Carollee Herter, Alferd Apa, DO as PCP - General Jacelyn Pi, MD as Consulting Physician (Endocrinology) Ashok Pall, MD as Consulting Physician (  Neurosurgery) Monna Fam, MD as Consulting Physician (Ophthalmology)    Assessment:   This is a routine wellness examination for [place]. Physical assessment deferred to PCP.  Exercise Activities and Dietary recommendations   Diet (meal preparation, eat out, water intake, caffeinated beverages, dairy products, fruits and vegetables): {Desc; diets:16563} Breakfast: Lunch:  Dinner:      Goals    None      Fall Risk Fall Risk  04/01/2017 10/11/2016 11/14/2015 11/29/2014 11/17/2013  Falls in the past year? No No No No No    Depression Screen PHQ 2/9 Scores 04/01/2017 10/11/2016 11/14/2015 11/29/2014  PHQ - 2 Score 0 0 0 0     Cognitive Function MMSE - Mini Mental State Exam 04/01/2017  Orientation to time 5  Orientation to Place 5  Registration 3  Attention/ Calculation 5  Recall 2  Language- name 2 objects 2  Language- repeat 1  Language- follow 3 step command 3  Language- read & follow direction 1  Write a sentence 1  Copy design 1  Total score 29        Immunization History  Administered Date(s) Administered  . Influenza Split 08/08/2012  . Influenza, High Dose Seasonal PF 08/09/2014, 07/05/2015  . PPD Test 08/27/2011, 09/04/2011, 07/13/2014, 09/23/2015  . Pneumococcal Conjugate-13 08/09/2014  . Pneumococcal Polysaccharide-23 11/17/2013  . Td 03/11/2007  . Zoster 03/11/2007   Screening Tests Health Maintenance  Topic Date Due  . OPHTHALMOLOGY EXAM  11/24/2015  . HEMOGLOBIN A1C  05/13/2016  . FOOT EXAM  04/01/2018  . TETANUS/TDAP  12/14/2023 (Originally 03/10/2017)  . INFLUENZA VACCINE  05/08/2018  . MAMMOGRAM  05/30/2019  . Fecal DNA (Cologuard)   04/15/2020  . DEXA SCAN  Completed  . Hepatitis C Screening  Completed  . PNA vac Low Risk Adult  Completed      Plan:   ***   I have personally reviewed and noted the following in the patient's chart:   . Medical and social history . Use of alcohol, tobacco or illicit drugs  . Current medications and supplements . Functional ability and status . Nutritional status . Physical activity . Advanced directives . List of other physicians . Hospitalizations, surgeries, and ER visits in previous 12 months . Vitals . Screenings to include cognitive, depression, and falls . Referrals and appointments  In addition, I have reviewed and discussed with patient certain preventive protocols, quality metrics, and best practice recommendations. A written personalized care plan for preventive services as well as general preventive health recommendations were provided to patient.     Shela Nevin, [place]  04/17/2018

## 2018-04-24 ENCOUNTER — Ambulatory Visit: Payer: Medicare HMO | Admitting: *Deleted

## 2018-04-30 NOTE — Progress Notes (Deleted)
Subjective:   [name] is a 72 y.o. female who presents for Medicare Annual (Subsequent) preventive examination.  Review of Systems: No ROS.  Medicare Wellness Visit. Additional risk factors are reflected in the social history.   Sleep patterns:  Home Safety/Smoke Alarms: Feels safe in home. Smoke alarms in place.  Living environment; residence and Firearm Safety: Lives with husband and 2 grandchildren Eye- Dr.Hecker  Female:    Mammo-utd       Dexa scan-utd        CCS-Cologuard 04/15/17-neg     Objective:     Vitals: There were no vitals taken for this visit.  There is no height or weight on file to calculate BMI.  Advanced Directives 10/14/2017 04/01/2017 08/01/2016 09/29/2015 12/31/2014  Does Patient Have a Medical Advance Directive? No No No No No  Would patient like information on creating a medical advance directive? - Yes (MAU/Ambulatory/Procedural Areas - Information given) No - patient declined information Yes - Educational materials given No - patient declined information    Tobacco Social History   Tobacco Use  Smoking Status Current Some Day Smoker  . Packs/day: 0.20  . Types: Cigarettes  Smokeless Tobacco Never Used     Ready to quit: Not Answered Counseling given: Not Answered   Clinical Intake:                       Past Medical History:  Diagnosis Date  . 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 Depression   . Diabetes mellitus   . Hyperlipidemia   . Hypertension   . Pancreatitis   . Restless leg syndrome    Past Surgical History:  Procedure Laterality Date  . CATARACT EXTRACTION, BILATERAL    . CORONARY ANGIOPLASTY WITH STENT PLACEMENT    . FOOT SURGERY Right    cyst removal  . ROTATOR CUFF REPAIR     right  . TUBAL LIGATION     Family History  Problem Relation Age of Onset  . Heart attack Father   . Lung cancer Unknown   . Thyroid cancer Unknown   . Uterine cancer Unknown   . Breast cancer Sister   .  Cancer Other        lung,thyroid,breast,uterine  . Heart attack Sister   . Heart attack Brother 39   Social History   Socioeconomic History  . Marital status: Married    Spouse name: Not on file  . Number of children: Not on file  . Years of education: Not on file  . Highest education level: Not on file  Occupational History  . Not on file  Social Needs  . Financial resource strain: Not on file  . Food insecurity:    Worry: Not on file    Inability: Not on file  . Transportation needs:    Medical: Not on file    Non-medical: Not on file  Tobacco Use  . Smoking status: Current Some Day Smoker    Packs/day: 0.20    Types: Cigarettes  . Smokeless tobacco: Never Used  Substance and Sexual Activity  . Alcohol use: No  . Drug use: No  . Sexual activity: Not on file  Lifestyle  . Physical activity:    Days per week: Not on file    Minutes per session: Not on file  . Stress: Not on file  Relationships  . Social connections:    Talks on phone: Not on file  Gets together: Not on file    Attends religious service: Not on file    Active member of club or organization: Not on file    Attends meetings of clubs or organizations: Not on file    Relationship status: Not on file  Other Topics Concern  . Not on file  Social History Narrative   Exercise: seldom   Caffeine use: seldom    Outpatient Encounter Medications as of 05/08/2018  Medication Sig  . alendronate (FOSAMAX) 70 MG tablet take 1 tablet by mouth every week TAKE WITH FULL GLASS OF WATER ON AN EMPTY STOMACH  . ALPRAZolam (XANAX) 0.25 MG tablet Take 1 tablet by mouth three times a day if needed  . amLODipine (NORVASC) 10 MG tablet take 1 tablet by mouth once daily  . amoxicillin-clavulanate (AUGMENTIN) 875-125 MG tablet Take 1 tablet by mouth 2 (two) times daily.  [name] aspirin EC 81 MG tablet Take 81 mg by mouth daily.  [name] atorvastatin (LIPITOR) 40 MG tablet take 1 tablet by mouth once daily  . Blood Glucose  Calibration (TAI DOC CONTROL) NORMAL SOLN   . Blood Glucose Monitoring Suppl (ACCU-CHEK AVIVA PLUS) w/Device KIT Check blood sugar daily. E11.9  . Carboxymethylcellulose Sodium (THERATEARS OP) Place 1 drop into both eyes daily as needed (dry eyes).  . Cyanocobalamin (VITAMIN B-12 PO) Take 1 tablet by mouth daily.  . ferrous sulfate 325 (65 FE) MG tablet Take 325 mg by mouth daily with breakfast.  . Flaxseed, Linseed, (FLAXSEED OIL PO) Take 1 capsule by mouth 2 (two) times daily.   [name] FLUoxetine (PROZAC) 20 MG capsule take 1 capsule by mouth once daily  . fluticasone (FLONASE) 50 MCG/ACT nasal spray Place 2 sprays into both nostrils daily. (Patient taking differently: Place 2 sprays into both nostrils daily as needed. )  . gabapentin (NEURONTIN) 300 MG capsule Take 1 capsule by mouth at night for 7 days and then one by mouth twice per day. He is tolerating may take up to 3 times per day.  [name] GINKGO BILOBA COMPLEX PO Take 1 tablet by mouth daily.   [name] glucose blood (ACCU-CHEK AVIVA) test strip Check blood sugar four times daily. E11.9  . insulin regular (NOVOLIN R RELION) 100 units/mL injection Inject 0.3 mLs (30 Units total) into the skin daily as needed for high blood sugar (at noon if CBG >200 and will be eating).  . Insulin Syringe-Needle U-100 (BD INSULIN SYRINGE ULTRAFINE) 31G X 5/16" 0.3 ML MISC 10 Units by Does not apply route 3 (three) times daily before meals.    Elmore Guise Devices (LANCING DEVICE) MISC   . levocetirizine (XYZAL) 5 MG tablet take 1 tablet by mouth every evening  . lisinopril (PRINIVIL,ZESTRIL) 10 MG tablet TAKE 1 TABLET BY MOUTH ONCE DAILY  . metoprolol succinate (TOPROL-XL) 50 MG 24 hr tablet take 1 tablet by mouth once daily with food  . Multiple Vitamin (MULTIVITAMIN WITH MINERALS) TABS Take 1 tablet by mouth daily.  [name] NOVOLIN N RELION 100 UNIT/ML injection Inject 30-60 Units into the skin 2 (two) times daily. Inject 40 units subcutaneously before breakfast and 60 units at  bedtime  . vitamin E 400 UNIT capsule Take 400 Units by mouth daily.   No facility-administered encounter medications on file as of 05/08/2018.     Activities of Daily Living No flowsheet data found.  Patient Care Team: Carollee Herter, Alferd Apa, DO as PCP - General Jacelyn Pi, MD as Consulting Physician (Endocrinology) Ashok Pall, MD as  Consulting Physician (Neurosurgery) Monna Fam, MD as Consulting Physician (Ophthalmology)    Assessment:   This is a routine wellness examination for [place]. Physical assessment deferred to PCP.  Exercise Activities and Dietary recommendations   Diet (meal preparation, eat out, water intake, caffeinated beverages, dairy products, fruits and vegetables): {Desc; diets:16563} Breakfast: Lunch:  Dinner:      Goals    None      Fall Risk Fall Risk  04/01/2017 10/11/2016 11/14/2015 11/29/2014 11/17/2013  Falls in the past year? No No No No No   Depression Screen PHQ 2/9 Scores 04/01/2017 10/11/2016 11/14/2015 11/29/2014  PHQ - 2 Score 0 0 0 0     Cognitive Function MMSE - Mini Mental State Exam 04/01/2017  Orientation to time 5  Orientation to Place 5  Registration 3  Attention/ Calculation 5  Recall 2  Language- name 2 objects 2  Language- repeat 1  Language- follow 3 step command 3  Language- read & follow direction 1  Write a sentence 1  Copy design 1  Total score 29        Immunization History  Administered Date(s) Administered  . Influenza Split 08/08/2012  . Influenza, High Dose Seasonal PF 08/09/2014, 07/05/2015  . PPD Test 08/27/2011, 09/04/2011, 07/13/2014, 09/23/2015  . Pneumococcal Conjugate-13 08/09/2014  . Pneumococcal Polysaccharide-23 11/17/2013  . Td 03/11/2007  . Zoster 03/11/2007    Screening Tests Health Maintenance  Topic Date Due  . OPHTHALMOLOGY EXAM  11/24/2015  . HEMOGLOBIN A1C  05/13/2016  . FOOT EXAM  04/01/2018  . TETANUS/TDAP  12/14/2023 (Originally 03/10/2017)  . INFLUENZA VACCINE  05/08/2018    . MAMMOGRAM  05/30/2019  . Fecal DNA (Cologuard)  04/15/2020  . DEXA SCAN  Completed  . Hepatitis C Screening  Completed  . PNA vac Low Risk Adult  Completed      Plan:   ***   I have personally reviewed and noted the following in the patient's chart:   . Medical and social history . Use of alcohol, tobacco or illicit drugs  . Current medications and supplements . Functional ability and status . Nutritional status . Physical activity . Advanced directives . List of other physicians . Hospitalizations, surgeries, and ER visits in previous 12 months . Vitals . Screenings to include cognitive, depression, and falls . Referrals and appointments  In addition, I have reviewed and discussed with patient certain preventive protocols, quality metrics, and best practice recommendations. A written personalized care plan for preventive services as well as general preventive health recommendations were provided to patient.     [name] [place], [place]  04/30/2018

## 2018-05-08 ENCOUNTER — Ambulatory Visit: Payer: Medicare HMO | Admitting: *Deleted

## 2018-05-22 DIAGNOSIS — E049 Nontoxic goiter, unspecified: Secondary | ICD-10-CM | POA: Diagnosis not present

## 2018-05-22 DIAGNOSIS — L299 Pruritus, unspecified: Secondary | ICD-10-CM | POA: Diagnosis not present

## 2018-05-22 DIAGNOSIS — I10 Essential (primary) hypertension: Secondary | ICD-10-CM | POA: Diagnosis not present

## 2018-05-22 DIAGNOSIS — E1165 Type 2 diabetes mellitus with hyperglycemia: Secondary | ICD-10-CM | POA: Diagnosis not present

## 2018-05-22 DIAGNOSIS — E1140 Type 2 diabetes mellitus with diabetic neuropathy, unspecified: Secondary | ICD-10-CM | POA: Diagnosis not present

## 2018-05-29 ENCOUNTER — Ambulatory Visit: Payer: Medicare HMO | Admitting: Podiatry

## 2018-05-29 ENCOUNTER — Ambulatory Visit: Payer: Self-pay | Admitting: Family Medicine

## 2018-05-29 ENCOUNTER — Ambulatory Visit: Payer: Medicare HMO | Admitting: Orthotics

## 2018-06-26 ENCOUNTER — Ambulatory Visit: Payer: Medicare HMO | Admitting: Podiatry

## 2018-07-08 ENCOUNTER — Other Ambulatory Visit: Payer: Self-pay | Admitting: Family Medicine

## 2018-07-14 ENCOUNTER — Ambulatory Visit: Payer: Self-pay | Admitting: *Deleted

## 2018-07-14 NOTE — Telephone Encounter (Signed)
Pt reports "A vibration in the middle of my chest."  Onset "2 or more weeks ago." Denies chest pain, SOB, dizziness, nausea, diaphoresis. States "Vibration" is intermittent, lasting 10-15 seconds. States is "Hard to describe." States sensation is not palpitations.  Hr during call 68. Pt. 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 Appt made for Wednesday per pt's request due to work schedule. Care advise given per protocol. Reason for Disposition . Nursing judgment or information in reference  Answer Assessment - Initial Assessment Questions 1. REASON FOR CALL: "What is your main concern right now?"     Center of chest "vibrating." 2. ONSET: "When did the *No Answer* start?"     2 weeks ago or longer 3. SEVERITY: "How bad is the *No Answer*?"     Mild 4. FEVER: "Do you have a fever?"     no 5. OTHER SYMPTOMS: "Do you have any other new symptoms?"     no 6. INTERVENTIONS AND RESPONSE: "What have you done so far to try to make this better? What medications have you used?"     Resting make it better  Protocols used: NO GUIDELINE AVAILABLE-A-AH

## 2018-07-16 ENCOUNTER — Ambulatory Visit (INDEPENDENT_AMBULATORY_CARE_PROVIDER_SITE_OTHER): Payer: Medicare HMO | Admitting: Medical

## 2018-07-16 ENCOUNTER — Telehealth: Payer: Self-pay | Admitting: Medical

## 2018-07-16 ENCOUNTER — Encounter: Payer: Self-pay | Admitting: Medical

## 2018-07-16 ENCOUNTER — Ambulatory Visit (HOSPITAL_BASED_OUTPATIENT_CLINIC_OR_DEPARTMENT_OTHER)
Admission: RE | Admit: 2018-07-16 | Discharge: 2018-07-16 | Disposition: A | Discharge status: Home / Self Care | Payer: Medicare HMO | Source: Ambulatory Visit | Attending: Medical | Admitting: Medical

## 2018-07-16 VITALS — BP 142/58 | HR 71 | Temp 98.2°F | Resp 16 | Ht 64.0 in | Wt 174.2 lb

## 2018-07-16 DIAGNOSIS — R0789 Other chest pain: Secondary | ICD-10-CM | POA: Diagnosis not present

## 2018-07-16 DIAGNOSIS — R05 Cough: Secondary | ICD-10-CM | POA: Diagnosis not present

## 2018-07-16 DIAGNOSIS — F17200 Nicotine dependence, unspecified, uncomplicated: Secondary | ICD-10-CM

## 2018-07-16 DIAGNOSIS — K219 Gastro-esophageal reflux disease without esophagitis: Secondary | ICD-10-CM | POA: Diagnosis not present

## 2018-07-16 DIAGNOSIS — M791 Myalgia, unspecified site: Secondary | ICD-10-CM

## 2018-07-16 DIAGNOSIS — I700 Atherosclerosis of aorta: Secondary | ICD-10-CM | POA: Diagnosis not present

## 2018-07-16 DIAGNOSIS — R059 Cough, unspecified: Secondary | ICD-10-CM

## 2018-07-16 DIAGNOSIS — R5383 Other fatigue: Secondary | ICD-10-CM | POA: Diagnosis not present

## 2018-07-16 DIAGNOSIS — R002 Palpitations: Secondary | ICD-10-CM | POA: Diagnosis not present

## 2018-07-16 DIAGNOSIS — J398 Other specified diseases of upper respiratory tract: Secondary | ICD-10-CM | POA: Diagnosis not present

## 2018-07-16 LAB — CBC WITH DIFFERENTIAL/PLATELET
BASOS ABS: 0 10*3/uL (ref 0.0–0.1)
BASOS PCT: 0.6 % (ref 0.0–3.0)
Eosinophils Absolute: 0.1 10*3/uL (ref 0.0–0.7)
Eosinophils Relative: 1.5 % (ref 0.0–5.0)
HEMATOCRIT: 38.8 % (ref 36.0–46.0)
Hemoglobin: 12.9 g/dL (ref 12.0–15.0)
Lymphocytes Relative: 46.4 % — ABNORMAL HIGH (ref 12.0–46.0)
Lymphs Abs: 3.1 10*3/uL (ref 0.7–4.0)
MCHC: 33.2 g/dL (ref 30.0–36.0)
MCV: 90.2 fl (ref 78.0–100.0)
MONOS PCT: 6.8 % (ref 3.0–12.0)
Monocytes Absolute: 0.4 10*3/uL (ref 0.1–1.0)
NEUTROS ABS: 3 10*3/uL (ref 1.4–7.7)
Neutrophils Relative %: 44.7 % (ref 43.0–77.0)
PLATELETS: 177 10*3/uL (ref 150.0–400.0)
RBC: 4.3 Mil/uL (ref 3.87–5.11)
RDW: 13.6 % (ref 11.5–15.5)
WBC: 6.6 10*3/uL (ref 4.0–10.5)

## 2018-07-16 LAB — COMPREHENSIVE METABOLIC PANEL
ALBUMIN: 4.2 g/dL (ref 3.5–5.2)
ALT: 21 U/L (ref 0–35)
AST: 18 U/L (ref 0–37)
Alkaline Phosphatase: 55 U/L (ref 39–117)
BILIRUBIN TOTAL: 0.7 mg/dL (ref 0.2–1.2)
BUN: 16 mg/dL (ref 6–23)
CALCIUM: 9.7 mg/dL (ref 8.4–10.5)
CO2: 31 meq/L (ref 19–32)
Chloride: 104 mEq/L (ref 96–112)
Creatinine, Ser: 0.72 mg/dL (ref 0.40–1.20)
GFR: 102.25 mL/min (ref >60.00)
Glucose, Bld: 140 mg/dL — ABNORMAL HIGH (ref 70–99)
Potassium: 4 mEq/L (ref 3.5–5.1)
Sodium: 141 mEq/L (ref 135–145)
Total Protein: 6.7 g/dL (ref 6.0–8.3)

## 2018-07-16 LAB — TROPONIN I: TNIDX: 0.04 ug/L (ref 0.00–0.06)

## 2018-07-16 LAB — TSH: TSH: 0.57 u[IU]/mL (ref 0.35–4.50)

## 2018-07-16 LAB — C-REACTIVE PROTEIN: CRP: 0.1 mg/dL — ABNORMAL LOW (ref 0.5–20.0)

## 2018-07-16 LAB — SEDIMENTATION RATE: SED RATE: 2 mm/h (ref 0–30)

## 2018-07-16 LAB — VITAMIN B12: VITAMIN B 12: 913 pg/mL — AB (ref 211–911)

## 2018-07-16 NOTE — Telephone Encounter (Signed)
Will you see if pt can see cardiologist tomorrow or Friday. No current symptoms since yesterday in am. Pt troponin today was .04(within normal reference range per our lab). Please give me update by tomorrow am at latest.

## 2018-07-16 NOTE — Patient Instructions (Addendum)
You have some atypical symptoms intermittent chest discomfort described as a vibration sensation.  No symptom since yesterday morning/approximately 24 hours.  You do have cardiovascular risk factors including hypertension, diabetes, hyperlipidemia, history of smoking and family history of MI.  Your EKG done today shows some sinus bradycardia with right bundle branch block and nonspecific T wave abnormality.  I compared to thousand 2017 and shows no significant change.  Since you have no current cardiovascular type symptoms presently we will do one set of troponin stat.  If that were to be positive then you would need to be seen in ED. also would need to be seen in emergency department if you were to have any recurrent vibration sensation or chest pain.  We will go ahead and place referral to cardiology to evaluate this atypical sensation due to your numerous risk factors.  For fatigue, I did place a CBC, CMP, TSH, B12 and B1 labs.  For 2 weeks of muscle aches and joint pains, I did place inflammatory lab work-up studies.  For occasional reflux over the past 2 weeks, recommend that you get Zantac from our pharmacy downstairs.  Or you could use Tagamet or Pepcid AC.  Also recent intermittent dry cough with reported fatigue so we will get a chest x-ray downstairs.  Recent mild faint headache and trapezius tenderness/tightness might be related to stress.  Will evaluate GI work-up first and then give some conservative type advisement for possible tension headache.  Neurologic exam was completely normal today.  Follow-up in 7 to 10 days or as needed.

## 2018-07-16 NOTE — Progress Notes (Signed)
Subjective:    Patient ID: [name], female    DOB: Feb 16, 1946, 72 y.o.   MRN: [id number]  HPI  Pt in with feeling of vibrations in her chest. Some pain at times in her left axillary area at times. She states vibration sensation that moves to axillary area. Some occasional neck pain.  Pt states just yesterday morning she had brief vibration sense to chest for 10-15 seconds then went away. Also no neck pain in axillary area or neck region.   Pt denies that it felt like palpitations. She has had those before. No sob, no chest pressure or shoulder pain.   Pt states that typically over past few weeks will get vibration sense about 3 times a day. Each event would last for 15 seconds.  Pt does have history of smoking, hx of hyperlipidemia, pt is diabetic and pt has htn. Pt dad and brother had MI.   Pt does report feeling fatigued over past 2 weeks. With some joint pain.  No vibration sensation in chest since yesterday morning.     Review of Systems  Constitutional: Negative for chills, fatigue and fever.  Eyes: Negative for photophobia and pain.  Respiratory: Negative for cough, chest tightness, shortness of breath and wheezing.   Cardiovascular: Negative for chest pain and palpitations.       Vibration sense yesterday. None today.  Gastrointestinal: Positive for nausea. Negative for abdominal pain, rectal pain and vomiting.       Faint nausea this morning. Some recent heart burn sensation on and off past 2 weeks.   Skin: Negative for rash.  Neurological: Negative for dizziness, seizures, speech difficulty, weakness and headaches.       Faint very low barely ha now. trapexius slight tender. No neuro signs or symptoms.  Hematological: Negative for adenopathy. Does not bruise/bleed easily.  Psychiatric/Behavioral: Negative for behavioral problems and confusion.       Admits some stress recently.       Objective:   Physical Exam  General Mental Status- Alert. General  Appearance- Not in acute distress.   Skin General: Color- Normal Color. Moisture- Normal Moisture.  Neck Carotid Arteries- Normal color. Moisture- Normal Moisture. No carotid bruits. No JVD.  Chest and Lung Exam Auscultation: Breath Sounds:-Normal.  Cardiovascular Auscultation:Rythm- Regular. Murmurs & Other Heart Sounds:Auscultation of the heart reveals- No Murmurs.  Abdomen Inspection:-Inspeection Normal. Palpation/Percussion:Note:No mass. Palpation and Percussion of the abdomen reveal- Non Tender, Non Distended + BS, no rebound or guarding.   Neurologic Cranial Nerve exam:- CN III-XII intact(No nystagmus), symmetric smile. Drift Test:- No drift. Finger to Nose:- Normal/Intact Strength:- 5/5 equal and symmetric strength both upper and lower extremities.  Anterior thorax- faint costochondral junction tenderness.    Assessment & Plan:  [phone]  You have some atypical symptoms intermittent chest discomfort described as a vibration sensation.  No symptom since yesterday morning/approximately 24 hours.  You do have cardiovascular risk factors including hypertension, diabetes, hyperlipidemia, history of smoking and family history of MI.  Your EKG done today shows some sinus bradycardia with right bundle branch block and nonspecific T wave abnormality.  I compared to thousand 2017 and shows no significant change.  Since you have no current cardiovascular type symptoms presently we will do one set of troponin stat.  If that were to be positive then you would need to be seen in ED. also would need to be seen in emergency department if you were to have any recurrent vibration sensation or chest pain.  We will go ahead and place referral to cardiology to evaluate this atypical sensation due to your numerous risk factors.  For fatigue, I did place a CBC, CMP, TSH, B12 and B1 labs.  For 2 weeks of muscle aches and joint pains, I did place inflammatory lab work-up studies.  For  occasional reflux over the past 2 weeks, recommend that you get Zantac from our pharmacy downstairs.  Or you could use Tagamet or Pepcid AC.  Also recent intermittent dry cough with reported fatigue so we will get a chest x-ray downstairs.  Recent mild faint headache and trapezius tenderness/tightness might be related to stress.  Will evaluate GI work-up first and then give some conservative type advisement for possible tension headache.  Neurologic exam was completely normal today.  Follow-up in 7 to 10 days or as needed.  40 minutes spent with pt. 50% of time counseling on plan of treatment and work up for her various complaints. Also coordinated referral to cardiologist.  Esperanza Richters, PA-C

## 2018-07-17 ENCOUNTER — Ambulatory Visit: Payer: Self-pay | Admitting: Family Medicine

## 2018-07-17 NOTE — Telephone Encounter (Signed)
Outgoing call to patient who patient who complained of Abdominal pain, headache, and shoulders hurting.  States headaches don't last long.  States she ate  Some chicken salad about 2 weeks ago. Since [employer] has sent her a letter recalling the chicken salad.  States it was a sudden onset.   Feels all of these symptoms are from consuming the bad chicken salad. Patient states she has pain that radiates to herto her shoulders, has developed headaches. Reports the pain  comes and goes.  Rates the pain as moderate.   States her " head feels like it weighs 50lbs".  Takes tylenol  With occasional relief.  Patient states if I lay down that helps also.   This is the first time it has occurred.  Patient scheduled for an appointment  for 07/21/18 @ 200pm.   With Dr.  Laury Axon- [place].  Patient voices understanding.  Provided care advice.  Voiced understanding.   Reason for Disposition . Abdominal pain is a chronic symptom (recurrent or ongoing AND present > 4 weeks)  Answer Assessment - Initial Assessment Questions 1. LOCATION: "Where does it hurt?"      Stomach  2. RADIATION: "Does the pain shoot anywhere else?" (e.g., chest, back)     Shoulder neck feel stiff 3. ONSET: "When did the pain begin?" (e.g., minutes, hours or days ago)       2 weeks or more 4. SUDDEN: "Gradual or sudden onset?"      All of a sudden 5. PATTERN "Does the pain come and go, or is it constant?"    - If constant: "Is it getting better, staying the same, or worsening?"      (Note: Constant means the pain never goes away completely; most serious pain is constant and it progresses)     - If intermittent: "How long does it last?" "Do you have pain now?"     (Note: Intermittent means the pain goes away completely between bouts)     Comes and goes 6. SEVERITY: "How bad is the pain?"  (e.g., Scale 1-10; mild, moderate, or severe)   - MILD (1-3): doesn't interfere with normal activities, abdomen soft and not tender to touch    - MODERATE (4-7):  interferes with normal activities or awakens from sleep, tender to touch    - SEVERE (8-10): excruciating pain, doubled over, unable to do any normal activities      Moderate. Sometimes feel head weight about 50 lbs 7. RECURRENT SYMPTOM: "Have you ever had this type of abdominal pain before?" If so, ask: "When was the last time?" and "What happened that time?"      noi 8. CAUSE: "What do you think is causing the abdominal pain?"     Chicken salad 9. RELIEVING/AGGRAVATING FACTORS: "What makes it better or worse?" (e.g., movement, antacids, bowel movement)     Tylenol , if I lay down flat 10. OTHER SYMPTOMS: "Has there been any vomiting, diarrhea, constipation, or urine problems?"       A little nauseous 11. PREGNANCY: "Is there any chance you are pregnant?" "When was your last menstrual period?"        na  Protocols used: ABDOMINAL PAIN - [facility]

## 2018-07-18 ENCOUNTER — Other Ambulatory Visit (INDEPENDENT_AMBULATORY_CARE_PROVIDER_SITE_OTHER): Payer: Medicare HMO

## 2018-07-18 ENCOUNTER — Telehealth: Payer: Self-pay | Admitting: Medical

## 2018-07-18 DIAGNOSIS — R739 Hyperglycemia, unspecified: Secondary | ICD-10-CM

## 2018-07-18 MED ORDER — PREDNISONE 10 MG PO TABS: ORAL_TABLET | ORAL | 0 refills | Status: DC

## 2018-07-18 NOTE — Telephone Encounter (Signed)
Rx prednisone sent to pt pharmacy. 

## 2018-07-19 LAB — HEMOGLOBIN A1C
Hgb A1c MFr Bld: 10 % of total Hgb — ABNORMAL HIGH (ref <5.7)
Mean Plasma Glucose: 240 (calc)
eAG (mmol/L): 13.3 (calc)

## 2018-07-20 ENCOUNTER — Other Ambulatory Visit: Payer: Self-pay | Admitting: Family Medicine

## 2018-07-20 LAB — VITAMIN B1: VITAMIN B1 (THIAMINE): 19 nmol/L (ref 8–30)

## 2018-07-20 LAB — ANA: ANA: NEGATIVE

## 2018-07-20 LAB — RHEUMATOID FACTOR: RHEUMATOID FACTOR: 82 [IU]/mL — AB (ref <14)

## 2018-07-21 ENCOUNTER — Ambulatory Visit (INDEPENDENT_AMBULATORY_CARE_PROVIDER_SITE_OTHER): Payer: Medicare HMO | Admitting: Family Medicine

## 2018-07-21 ENCOUNTER — Encounter: Payer: Self-pay | Admitting: Family Medicine

## 2018-07-21 VITALS — BP 132/44 | HR 65 | Temp 98.5°F | Resp 16 | Ht 64.0 in | Wt 178.4 lb

## 2018-07-21 DIAGNOSIS — Z23 Encounter for immunization: Secondary | ICD-10-CM

## 2018-07-21 DIAGNOSIS — M791 Myalgia, unspecified site: Secondary | ICD-10-CM

## 2018-07-21 DIAGNOSIS — R768 Other specified abnormal immunological findings in serum: Secondary | ICD-10-CM | POA: Diagnosis not present

## 2018-07-21 DIAGNOSIS — K529 Noninfective gastroenteritis and colitis, unspecified: Secondary | ICD-10-CM

## 2018-07-21 DIAGNOSIS — R7689 Other specified abnormal immunological findings in serum: Secondary | ICD-10-CM

## 2018-07-21 NOTE — Progress Notes (Signed)
Patient ID: [name], female    DOB: [birth year]  Age: 72 y.o. MRN: [id number]    Subjective:  Subjective  HPI  [name] presents for f/u headaches and myalgias.  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  The V/D has stopped but she still has nausea and headaches.   I spoke with Dr Linus Salmons , ID and there is no need to treat for the listeria as  No other complaints      Review of Systems  Constitutional: Negative for activity change, appetite change, chills, diaphoresis, fatigue, fever and unexpected weight change.  Eyes: Negative for pain, redness and visual disturbance.  Respiratory: Negative for cough, chest tightness, shortness of breath and wheezing.   Cardiovascular: Negative for chest pain, palpitations and leg swelling.  Gastrointestinal: Negative for abdominal distention and abdominal pain.  Endocrine: Negative for cold intolerance, heat intolerance, polydipsia, polyphagia and polyuria.  Genitourinary: Negative for difficulty urinating, dyspareunia, dysuria, flank pain, frequency, genital sores, hematuria, menstrual problem, pelvic pain, urgency, vaginal discharge and vaginal pain.  Musculoskeletal: Negative for back pain.  Neurological: Negative for dizziness, light-headedness, numbness and headaches.    History Past Medical History:  Diagnosis Date  . Blockage of coronary artery of heart ([place])   . Depression   . Diabetes mellitus   . Hyperlipidemia   . Hypertension   . Pancreatitis   . Restless leg syndrome     She has a past surgical history that includes Tubal ligation; Rotator cuff repair; Coronary angioplasty with stent; Foot surgery (Right); and Cataract extraction, bilateral.   Her family history includes Breast cancer in her sister; Cancer in her other; Heart attack in her father and sister; Heart attack (age of onset: 84) in her  brother; Lung cancer in her unknown relative; Thyroid cancer in her unknown relative; Uterine cancer in her unknown relative.She reports that she has been smoking cigarettes. She has been smoking about 0.20 packs per day. She has never used smokeless tobacco. She reports that she does not drink alcohol or use drugs.  Current Outpatient Medications on File Prior to Visit  Medication Sig Dispense Refill  . alendronate (FOSAMAX) 70 MG tablet take 1 tablet by mouth every week TAKE WITH FULL GLASS OF WATER ON AN EMPTY STOMACH 4 tablet 12  . ALPRAZolam (XANAX) 0.25 MG tablet Take 1 tablet by mouth three times a day if needed 60 tablet 0  . amLODipine (NORVASC) 10 MG tablet TAKE 1 TABLET BY MOUTH EVERY DAY 90 tablet 0  . aspirin EC 81 MG tablet Take 81 mg by mouth daily.    [name] atorvastatin (LIPITOR) 40 MG tablet take 1 tablet by mouth once daily 90 tablet 1  . Blood Glucose Calibration (TAI DOC CONTROL) NORMAL SOLN     . Blood Glucose Monitoring Suppl (ACCU-CHEK AVIVA PLUS) w/Device KIT Check blood sugar daily. E11.9 1 kit 0  . Carboxymethylcellulose Sodium (THERATEARS OP) Place 1 drop into both eyes daily as needed (dry eyes).    . Cyanocobalamin (VITAMIN B-12 PO) Take 1 tablet by mouth daily.    . ferrous sulfate 325 (65 FE) MG tablet Take 325 mg by mouth daily with breakfast.    . Flaxseed, Linseed, (FLAXSEED OIL PO) Take 1 capsule by mouth 2 (two) times daily.     [name] FLUoxetine (PROZAC) 20 MG capsule take 1 capsule by mouth  once daily 30 capsule 5  . fluticasone (FLONASE) 50 MCG/ACT nasal spray Place 2 sprays into both nostrils daily. (Patient taking differently: Place 2 sprays into both nostrils daily as needed. ) 16 g 6  . gabapentin (NEURONTIN) 300 MG capsule Take 1 capsule by mouth at night for 7 days and then one by mouth twice per day. He is tolerating may take up to 3 times per day. 180 capsule 1  . GINKGO BILOBA COMPLEX PO Take 1 tablet by mouth daily.     [name] glucose blood (ACCU-CHEK AVIVA) test  strip Check blood sugar four times daily. E11.9 400 each 3  . insulin regular (NOVOLIN R RELION) 100 units/mL injection Inject 0.3 mLs (30 Units total) into the skin daily as needed for high blood sugar (at noon if CBG >200 and will be eating). 10 mL   . Insulin Syringe-Needle U-100 (BD INSULIN SYRINGE ULTRAFINE) 31G X 5/16" 0.3 ML MISC 10 Units by Does not apply route 3 (three) times daily before meals.      Elmore Guise Devices (LANCING DEVICE) MISC     . Multiple Vitamin (MULTIVITAMIN WITH MINERALS) TABS Take 1 tablet by mouth daily.    [name] NOVOLIN N RELION 100 UNIT/ML injection Inject 30-60 Units into the skin 2 (two) times daily. Inject 40 units subcutaneously before breakfast and 60 units at bedtime    . predniSONE (DELTASONE) 10 MG tablet 6 TAB PO DAY 1 5 TAB PO DAY 2 4 TAB PO DAY 3 3 TAB PO DAY 4 2 TAB PO DAY 5 1 TAB PO DAY 6 21 tablet 0  . vitamin E 400 UNIT capsule Take 400 Units by mouth daily.    [name] lisinopril (PRINIVIL,ZESTRIL) 10 MG tablet TAKE 1 TABLET BY MOUTH EVERY DAY 90 tablet 0  . metoprolol succinate (TOPROL-XL) 50 MG 24 hr tablet TAKE 1 TABLET BY MOUTH ONCE DAILY WITH FOOD 90 tablet 0   No current facility-administered medications on file prior to visit.      Objective:  Objective  Physical Exam  Constitutional: She is oriented to person, place, and time. She appears well-developed and well-nourished.  HENT:  Head: Normocephalic and atraumatic.  Eyes: Conjunctivae and EOM are normal.  Neck: Normal range of motion. Neck supple. No JVD present. Carotid bruit is not present. No thyromegaly present.  Cardiovascular: Normal rate, regular rhythm and normal heart sounds.  No murmur heard. Pulmonary/Chest: Effort normal and breath sounds normal. No respiratory distress. She has no wheezes. She has no rales. She exhibits no tenderness.  Musculoskeletal: She exhibits no edema.  Neurological: She is alert and oriented to person, place, and time.  Psychiatric: She has a normal mood  and affect.  Nursing note and vitals reviewed.  BP (!) 132/44 (BP Location: Left Arm, Cuff Size: Normal)   Pulse 65   Temp 98.5 F (36.9 C) (Oral)   Resp 16   Ht '5\' 4"'  (1.626 m)   Wt 178 lb 6.4 oz (80.9 kg)   SpO2 100%   BMI 30.62 kg/m  Wt Readings from Last 3 Encounters:  07/21/18 178 lb 6.4 oz (80.9 kg)  07/16/18 174 lb 3.2 oz (79 kg)  12/13/17 172 lb 6.4 oz (78.2 kg)     Lab Results  Component Value Date   WBC 6.6 07/16/2018   HGB 12.9 07/16/2018   HCT 38.8 07/16/2018   PLT 177.0 07/16/2018   GLUCOSE 140 (H) 07/16/2018   CHOL 135 12/13/2017   TRIG 209.0 (H) 12/13/2017  HDL 32.90 (L) 12/13/2017   LDLDIRECT 70.0 12/13/2017   LDLCALC 80 02/18/2016   ALT 21 07/16/2018   AST 18 07/16/2018   NA 141 07/16/2018   K 4.0 07/16/2018   CL 104 07/16/2018   CREATININE 0.72 07/16/2018   BUN 16 07/16/2018   CO2 31 07/16/2018   TSH 0.57 07/16/2018   INR 1.1 03/03/2009   HGBA1C 10.0 (H) 07/18/2018   MICROALBUR 1.7 06/23/2015    Dg Chest 2 View  Result Date: 07/16/2018 CLINICAL DATA:  Cough EXAM: CHEST - 2 VIEW COMPARISON:  October 15, 2017 FINDINGS: There is no appreciable edema or consolidation. Heart size and pulmonary vascularity are within normal limits. There is aortic atherosclerosis. No adenopathy. There is mild degenerative change in the thoracic spine. There is mild rightward deviation of the upper thoracic trachea. IMPRESSION: No edema or consolidation.  Aortic atherosclerosis noted. Mild rightward deviation of the upper thoracic trachea, a finding raising concern for thyroid enlargement/potential goiter. Aortic Atherosclerosis (ICD10-I70.0). Electronically Signed   By: Lowella Grip III M.D.   On: 07/16/2018 11:00     Assessment & Plan:  Plan  I have discontinued Harriet C. Beem's levocetirizine and amoxicillin-clavulanate. I am also having her maintain her Insulin Syringe-Needle U-100, NOVOLIN N RELION, multivitamin with minerals, Lancing Device, TAI DOC  CONTROL, aspirin EC, glucose blood, (Flaxseed, Linseed, (FLAXSEED OIL PO)), GINKGO BILOBA COMPLEX PO, fluticasone, ACCU-CHEK AVIVA PLUS, ferrous sulfate, Cyanocobalamin (VITAMIN B-12 PO), vitamin E, Carboxymethylcellulose Sodium (THERATEARS OP), insulin regular, ALPRAZolam, gabapentin, atorvastatin, alendronate, FLUoxetine, amLODipine, and predniSONE.  No orders of the defined types were placed in this encounter.   Problem List Items Addressed This Visit      Unprioritized   Elevated rheumatoid factor - Primary    Refer to rheum       Relevant Orders   Ambulatory referral to Rheumatology   Gastroenteritis    ? Listeria--- no need to treat Resolving        Other Visit Diagnoses    Myalgia       Relevant Orders   Ambulatory referral to Rheumatology   Influenza vaccine administered       Relevant Orders   Flu vaccine HIGH DOSE PF (Fluzone High Dose) (Completed)      Follow-up: No follow-ups on file.  Ann Held, DO

## 2018-07-21 NOTE — Assessment & Plan Note (Signed)
?   Listeria--- no need to treat Resolving

## 2018-07-21 NOTE — Assessment & Plan Note (Signed)
Refer to rheum 

## 2018-07-23 ENCOUNTER — Other Ambulatory Visit: Payer: Self-pay | Admitting: Family Medicine

## 2018-07-23 DIAGNOSIS — E119 Type 2 diabetes mellitus without complications: Secondary | ICD-10-CM

## 2018-07-23 DIAGNOSIS — M5412 Radiculopathy, cervical region: Secondary | ICD-10-CM

## 2018-07-23 NOTE — Telephone Encounter (Signed)
Copied from CRM [id number]. Topic: Quick Communication - Rx Refill/Question >> Jul 23, 2018  3:23 PM Arlyss Gandy, NT wrote: Medication: glucose blood (ACCU-CHEK AVIVA) test strip, atorvastatin (LIPITOR) 40 MG tablet, gabapentin (NEURONTIN) 300 MG capsule, and Insulin Syringe-Needle U-100 (BD INSULIN SYRINGE ULTRAFINE) 31G X 5/16" 0.3 ML MISC  Has the patient contacted their pharmacy? Yes  (Agent: If no, request that the patient contact the pharmacy for the refill.) (Agent: If yes, when and what did the pharmacy advise?)  Preferred Pharmacy (with phone number or street name): [id number] [place], [place] - [phone] AT [facility] [address] & [address] [phone] (Phone) [phone] (Fax)    Agent: Please be advised that RX refills may take up to 3 business days. We ask that you follow-up with your pharmacy.

## 2018-07-23 NOTE — Telephone Encounter (Signed)
Requested medication (s) are due for refill today:  yes  Requested medication (s) are on the active medication list:  yes  Future visit scheduled:  no  Last Refill: Accu Check 12/14/14; Atorvastatin 01/15/17; Gabapentin 09/25/16; BD Insulin Syringe Historical Provider  Requested Prescriptions  Pending Prescriptions Disp Refills   atorvastatin (LIPITOR) 40 MG tablet 90 tablet 1    Sig: Take 1 tablet (40 mg total) by mouth daily.     Cardiovascular:  Antilipid - Statins Failed - 07/23/2018  4:06 PM      Failed - LDL in normal range and within 360 days    LDL Cholesterol  Date Value Ref Range Status  02/18/2016 80 0 - 99 mg/dL Final    Comment:           Total Cholesterol/HDL:CHD Risk Coronary Heart Disease Risk Table                     Men   Women  1/2 Average Risk   3.4   3.3  Average Risk       5.0   4.4  2 X Average Risk   9.6   7.1  3 X Average Risk  23.4   11.0        Use the calculated Patient Ratio above and the CHD Risk Table to determine the patient's CHD Risk.        ATP III CLASSIFICATION (LDL):  <100     mg/dL   Optimal  161-096  mg/dL   Near or Above                    Optimal  130-159  mg/dL   Borderline  045-409  mg/dL   High  >811     mg/dL   Very High          Failed - HDL in normal range and within 360 days    HDL  Date Value Ref Range Status  12/13/2017 32.90 (L) >39.00 mg/dL Final         Failed - Triglycerides in normal range and within 360 days    Triglycerides  Date Value Ref Range Status  12/13/2017 209.0 (H) 0.0 - 149.0 mg/dL Final    Comment:    Normal:  <150 mg/dLBorderline High:  150 - 199 mg/dL         Passed - Total Cholesterol in normal range and within 360 days    Cholesterol  Date Value Ref Range Status  12/13/2017 135 0 - 200 mg/dL Final    Comment:    ATP III Classification       Desirable:  < 200 mg/dL               Borderline High:  200 - 239 mg/dL          High:  > = 914 mg/dL         Passed - Patient is not pregnant       Passed - Valid encounter within last 12 months    Recent Outpatient Visits          2 days ago Elevated rheumatoid factor   Holiday representative at [employer], [place] R, [place]   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 He is tolerating may take up to 3 times per day.     Neurology: Anticonvulsants - gabapentin Passed - 07/23/2018  4:06 PM      Passed - Valid encounter within last 12 months    Recent Outpatient Visits          2 days ago Elevated rheumatoid factor   Holiday representative at [facility] [place], [place] R, DO   1 week ago Palpitation   Holiday representative at [employer], [place], [place]   7 months ago Preventative health care   [employer] [facility] at [facility] [place], [place] R, DO   1 year ago Herpes zoster without complication   Holiday representative at [employer], [place] R, DO   1 year ago Exudative tonsillitis   Holiday representative at [employer] [place], [place], [place]            Insulin Syringe-Needle U-100 (BD INSULIN SYRINGE U/F) 31G X 5/16" 0.3 ML MISC      Sig: 10 Units by Does not apply route 3 (three) times daily before meals.     There is no refill protocol information for this order     glucose blood (ACCU-CHEK AVIVA) test strip  400 each 3    Sig: Check blood sugar four times daily. 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

## 2018-07-24 MED ORDER — ATORVASTATIN CALCIUM 40 MG PO TABS: 40.0000 mg | ORAL_TABLET | Freq: Every day | ORAL | 1 refills | Status: DC

## 2018-07-24 MED ORDER — GABAPENTIN 300 MG PO CAPS: 300.0000 mg | ORAL_CAPSULE | Freq: Two times a day (BID) | ORAL | 1 refills | Status: DC

## 2018-07-24 MED ORDER — GLUCOSE BLOOD VI STRP: ORAL_STRIP | 3 refills | Status: DC

## 2018-07-24 MED ORDER — "INSULIN SYRINGE-NEEDLE U-100 31G X 5/16"" 0.3 ML MISC": 10.0000 [IU] | Freq: Three times a day (TID) | 5 refills | Status: DC

## 2018-07-28 ENCOUNTER — Ambulatory Visit (INDEPENDENT_AMBULATORY_CARE_PROVIDER_SITE_OTHER): Payer: Medicare HMO | Admitting: Cardiology

## 2018-07-28 ENCOUNTER — Encounter: Payer: Self-pay | Admitting: Cardiology

## 2018-07-28 VITALS — BP 130/58 | HR 61 | Ht 64.0 in | Wt 175.0 lb

## 2018-07-28 DIAGNOSIS — I2510 Atherosclerotic heart disease of native coronary artery without angina pectoris: Secondary | ICD-10-CM

## 2018-07-28 DIAGNOSIS — E1165 Type 2 diabetes mellitus with hyperglycemia: Secondary | ICD-10-CM | POA: Diagnosis not present

## 2018-07-28 DIAGNOSIS — I10 Essential (primary) hypertension: Secondary | ICD-10-CM

## 2018-07-28 DIAGNOSIS — Z7189 Other specified counseling: Secondary | ICD-10-CM | POA: Diagnosis not present

## 2018-07-28 DIAGNOSIS — R079 Chest pain, unspecified: Secondary | ICD-10-CM | POA: Diagnosis not present

## 2018-07-28 DIAGNOSIS — F17210 Nicotine dependence, cigarettes, uncomplicated: Secondary | ICD-10-CM

## 2018-07-28 DIAGNOSIS — Z720 Tobacco use: Secondary | ICD-10-CM | POA: Diagnosis not present

## 2018-07-28 DIAGNOSIS — E118 Type 2 diabetes mellitus with unspecified complications: Secondary | ICD-10-CM

## 2018-07-28 DIAGNOSIS — Z716 Tobacco abuse counseling: Secondary | ICD-10-CM | POA: Diagnosis not present

## 2018-07-28 DIAGNOSIS — E785 Hyperlipidemia, unspecified: Secondary | ICD-10-CM | POA: Diagnosis not present

## 2018-07-28 DIAGNOSIS — Z794 Long term (current) use of insulin: Secondary | ICD-10-CM | POA: Diagnosis not present

## 2018-07-28 DIAGNOSIS — IMO0002 Reserved for concepts with insufficient information to code with codable children: Secondary | ICD-10-CM

## 2018-07-28 NOTE — Progress Notes (Addendum)
Cardiology Office Note:    Date:  07/28/2018   ID:  [name], DOB 15-May-1946, MRN [id number]  PCP:  Carollee Herter, Alferd Apa, DO  Cardiologist:  Buford Dresser, MD PhD  Referring MD: Mackie Pai, PA-C   CC: chest discomfort.  History of Present Illness:    [name] is a 72 y.o. female with a hx of CAD s/p stent(s), type II diabetes on insulin, hyperlipidemia, hypertension who is seen as a new consult at the request of Saguier, Percell Miller, PA-C for the evaluation and management of chest pain.  Patient concerns:  Chest sensations, results of last tests she had.   Has a history of cardiac stent(s)--not sure where, what type, or how many. Last cath in 2001 by Dr. Naomie Dean. She denies any symptoms at the time. She was referred by her PCP. Denies any issues since 2001.   Recently noted a tingling/vibrating/burning feeling in the center of her chest. She thinks it might be indigestion. Has been less frequent recently, has improved with zantac and passing flatus. Worse with stress or aggravation, better with rest. Was happening three times/day, but now much less frequent. Lasts only seconds. No syncope, palpitations, PND, orthopnea, LE edema. No weight gain. No dyspnea on exertion. She was on prednisone but stopped this last week. Has had one episode of chest discomfort since stopping prednisone last week. No fevers/chills.   Had echo and lexiscan in 2017 when she was having fatigue and diaphoresis. No chest discomfort or shortness of breath at that time.   Risk factors: -Alcohol: none -Tobacco: sometimes; about a pack per week -Comorbidities: hypertension, hyperlipidemia, type II diabetes -Exercise level: sedentary many days, still works part time with minimal activity at work. Has intermittent activity, like 2 weeks ago when she helped her children cook for a family party. Doesn't think she can treadmill due to chronic knee pain. Does occasionally climb stairs.  -Family  history: brother and father both died of MI.  Past Medical History:  Diagnosis Date  . Blockage of coronary artery of heart ([place])   . Depression   . Diabetes mellitus   . Hyperlipidemia   . Hypertension   . Pancreatitis   . Restless leg syndrome     Past Surgical History:  Procedure Laterality Date  . CATARACT EXTRACTION, BILATERAL    . CORONARY ANGIOPLASTY WITH STENT PLACEMENT    . FOOT SURGERY Right    cyst removal  . ROTATOR CUFF REPAIR     right  . TUBAL LIGATION      Current Medications: Current Outpatient Medications on File Prior to Visit  Medication Sig  . alendronate (FOSAMAX) 70 MG tablet take 1 tablet by mouth every week TAKE WITH FULL GLASS OF WATER ON AN EMPTY STOMACH  . ALPRAZolam (XANAX) 0.25 MG tablet Take 1 tablet by mouth three times a day if needed  . amLODipine (NORVASC) 10 MG tablet TAKE 1 TABLET BY MOUTH EVERY DAY  . aspirin EC 81 MG tablet Take 81 mg by mouth daily.  [name] atorvastatin (LIPITOR) 40 MG tablet Take 1 tablet (40 mg total) by mouth daily.  . Blood Glucose Calibration (TAI DOC CONTROL) NORMAL SOLN   . Blood Glucose Monitoring Suppl (ACCU-CHEK AVIVA PLUS) w/Device KIT Check blood sugar daily. E11.9  . Carboxymethylcellulose Sodium (THERATEARS OP) Place 1 drop into both eyes daily as needed (dry eyes).  . Cyanocobalamin (VITAMIN B-12 PO) Take 1 tablet by mouth daily.  . ferrous sulfate 325 (65 FE) MG  tablet Take 325 mg by mouth daily with breakfast.  . Flaxseed, Linseed, (FLAXSEED OIL PO) Take 1 capsule by mouth 2 (two) times daily.   [name] FLUoxetine (PROZAC) 20 MG capsule take 1 capsule by mouth once daily  . fluticasone (FLONASE) 50 MCG/ACT nasal spray Place 2 sprays into both nostrils daily. (Patient taking differently: Place 2 sprays into both nostrils daily as needed. )  . gabapentin (NEURONTIN) 300 MG capsule Take 1 capsule (300 mg total) by mouth 2 (two) times daily. If tolerating may take 3 times per day if needed.  [name] GINKGO BILOBA COMPLEX PO  Take 1 tablet by mouth daily.   [name] glucose blood (ACCU-CHEK AVIVA) test strip Check blood sugar four times daily. E11.9  . insulin regular (NOVOLIN R RELION) 100 units/mL injection Inject 0.3 mLs (30 Units total) into the skin daily as needed for high blood sugar (at noon if CBG >200 and will be eating).  . Insulin Syringe-Needle U-100 (BD INSULIN SYRINGE U/F) 31G X 5/16" 0.3 ML MISC 10 Units by Does not apply route 3 (three) times daily before meals.  Elmore Guise Devices (LANCING DEVICE) MISC   . lisinopril (PRINIVIL,ZESTRIL) 10 MG tablet TAKE 1 TABLET BY MOUTH EVERY DAY  . metoprolol succinate (TOPROL-XL) 50 MG 24 hr tablet TAKE 1 TABLET BY MOUTH ONCE DAILY WITH FOOD  . Multiple Vitamin (MULTIVITAMIN WITH MINERALS) TABS Take 1 tablet by mouth daily.  [name] NOVOLIN N RELION 100 UNIT/ML injection Inject 30-60 Units into the skin 2 (two) times daily. Inject 40 units subcutaneously before breakfast and 60 units at bedtime  . vitamin E 400 UNIT capsule Take 400 Units by mouth daily.   No current facility-administered medications on file prior to visit.      Allergies:   Levemir [insulin detemir] and Metformin and related   Social History   Socioeconomic History  . Marital status: Married    Spouse name: Not on file  . Number of children: Not on file  . Years of education: Not on file  . Highest education level: Not on file  Occupational History  . Not on file  Social Needs  . Financial resource strain: Not on file  . Food insecurity:    Worry: Not on file    Inability: Not on file  . Transportation needs:    Medical: Not on file    Non-medical: Not on file  Tobacco Use  . Smoking status: Current Some Day Smoker    Packs/day: 0.20    Types: Cigarettes  . Smokeless tobacco: Never Used  Substance and Sexual Activity  . Alcohol use: No  . Drug use: No  . Sexual activity: Not on file  Lifestyle  . Physical activity:    Days per week: Not on file    Minutes per session: Not on file  .  Stress: Not on file  Relationships  . Social connections:    Talks on phone: Not on file    Gets together: Not on file    Attends religious service: Not on file    Active member of club or organization: Not on file    Attends meetings of clubs or organizations: Not on file    Relationship status: Not on file  Other Topics Concern  . Not on file  Social History Narrative   Exercise: seldom   Caffeine use: seldom     Family History: The patient's family history includes Breast cancer in her sister; Cancer in her other; Heart attack  in her father and sister; Heart attack (age of onset: 57) in her brother; Lung cancer in her unknown relative; Thyroid cancer in her unknown relative; Uterine cancer in her unknown relative.  ROS:   Please see the history of present illness.  Additional pertinent ROS:  Constitutional: Negative for chills, fever, night sweats, unintentional weight loss  HENT: Negative for ear pain and hearing loss.   Eyes: Negative for loss of vision and eye pain.  Respiratory: Negative for cough, sputum, shortness of breath, wheezing.   Cardiovascular: Positive for chest discomfort. Negative for palpitations , PND, orthopnea, lower extremity edema and claudication.  Gastrointestinal: Negative for abdominal pain, melena, and hematochezia.  Genitourinary: Negative for dysuria and hematuria.  Musculoskeletal: Negative for falls and myalgias.  Skin: Negative for itching and rash.  Neurological: Negative for focal weakness, focal sensory changes and loss of consciousness.  Endo/Heme/Allergies: Does not bruise/bleed easily.    EKGs/Labs/Other Studies Reviewed:    The following studies were reviewed today: 02/19/16 Echo Study Conclusions  - Left ventricle: The cavity size was normal. There was mild   concentric hypertrophy. Systolic function was normal. The   estimated ejection fraction was in the range of 60% to 65%. Wall   motion was normal; there were no regional wall  motion   abnormalities. Doppler parameters are consistent with abnormal   left ventricular relaxation (grade 1 diastolic dysfunction).   Doppler parameters are consistent with elevated ventricular   end-diastolic filling pressure. - Aortic valve: There was no regurgitation. - Aortic root: The aortic root was normal in size. - Mitral valve: Structurally normal valve. There was mild   regurgitation. - Left atrium: The atrium was normal in size. - Right ventricle: The cavity size was normal. Wall thickness was   normal. Systolic function was normal. - Tricuspid valve: There was mild regurgitation. - Pulmonary arteries: Systolic pressure was within the normal   range. - Inferior vena cava: The vessel was normal in size. - Pericardium, extracardiac: There was no pericardial effusion.  02/19/16 NM Stress test-Lexiscan FINDINGS: Perfusion: Allowing for anterior apical breast attenuation, there is No significant decreased activity in the left ventricle on stress imaging to suggest reversible ischemia or infarction.  Wall Motion: Normal left ventricular wall motion. No left ventricular dilation.  Left Ventricular Ejection Fraction: 58 % End diastolic volume 83 m End systolic volume 35 ml  IMPRESSION: 1. No reversible ischemia or infarction. 2. Normal left ventricular wall motion. 3. Left ventricular ejection fraction 58% 4. Low-risk stress test findings*.  EKG:  Prior ECG demonstrates LAFB and RBBB with a sinus rhythm  Recent Labs: 07/16/2018: ALT 21; BUN 16; Creatinine, Ser 0.72; Hemoglobin 12.9; Platelets 177.0; Potassium 4.0; Sodium 141; TSH 0.57  Recent Lipid Panel    Component Value Date/Time   CHOL 135 12/13/2017 1108   TRIG 209.0 (H) 12/13/2017 1108   HDL 32.90 (L) 12/13/2017 1108   CHOLHDL 4 12/13/2017 1108   VLDL 41.8 (H) 12/13/2017 1108   LDLCALC 80 02/18/2016 1001   LDLDIRECT 70.0 12/13/2017 1108    Physical Exam:    VS:  BP (!) 130/58 (BP Location:  Right Arm, Patient Position: Sitting, Cuff Size: Normal)   Pulse 61   Ht '5\' 4"'  (1.626 m)   Wt 175 lb (79.4 kg)   SpO2 98%   BMI 30.04 kg/m     Wt Readings from Last 3 Encounters:  07/28/18 175 lb (79.4 kg)  07/21/18 178 lb 6.4 oz (80.9 kg)  07/16/18 174 lb  3.2 oz (79 kg)     GEN: Well nourished, well developed in no acute distress HEENT: Normal NECK: No JVD; No carotid bruits LYMPHATICS: No lymphadenopathy CARDIAC: regular rhythm, normal S1 and S2, no murmurs, rubs, gallops. Radial and DP pulses 2+ bilaterally. RESPIRATORY:  Clear to auscultation without rales, wheezing or rhonchi  ABDOMEN: Soft, non-tender, non-distended MUSCULOSKELETAL:  No edema; No deformity  SKIN: Warm and dry NEUROLOGIC:  Alert and oriented x 3 PSYCHIATRIC:  Normal affect   ASSESSMENT:    1. Chest pain, unspecified type   2. Tobacco abuse   3. Tobacco abuse counseling   4. Counseling on health promotion and disease prevention   5. Essential hypertension   6. CAD in native artery   7. Uncontrolled type 2 diabetes mellitus with complication ([place])   8. Hyperlipidemia LDL goal <70    PLAN:    1. Chest discomfort, history of CAD: both typical and atypical components. Many risk factors, including prior CAD with stent(s)  -continue aspirin, atorvastatin 40 mg. LDL goal <70, last chest was right at 70.  -cannot treadmill and is diabetic. With prior stent(s), unclear locations/lengths, may have artifact with coronary CTA. Will order lexiscan MPI. She did have mild shortness of breath with lexiscan previously, but she states that it was manageable.  -working on diabetes control, but this may cause her to have atypical anginal symptoms  2. Tobacco abuse and counseling  The patient was counseled on the dangers of tobacco use, and was advised to quit.  Reviewed strategies to maximize success, including removing cigarettes and smoking materials from environment and substitution of other forms of  reinforcement. Ten minutes spent on tobacco cessation counseling.  3. Hypertension: just barely at systolic goal today  -continue lisinopril. On metoprolol succinate and tolerating, though if increased BP control needed would trial chlorthalidone or amlodipine  4. Prevention Prevention: -recommend heart healthy/Mediterranean diet, with whole grains, fruits, vegetable, fish, lean meats, nuts, and olive oil. Limit salt. -recommend moderate walking, 3-5 times/week for 30-50 minutes each session. Aim for at least 150 minutes.week. Goal should be pace of 3 miles/hours, or walking 1.5 miles in 30 minutes -recommend avoidance of tobacco products. Avoid excess alcohol. -Additional risk factor control:  -Diabetes: A1c is 10. Defer to endocrinology, working on control.   -Lipids:   -Blood pressure control: goal <130/80 with diabetes. Right at systolic goal today  -Weight: BMI 30. Counseled on lifestyle/weight loss as above  Plan for follow up: TBD based on results of MPI. If normal, follow up in 1 year.  Medication Adjustments/Labs and Tests Ordered: Current medicines are reviewed at length with the patient today.  Concerns regarding medicines are outlined above.  Orders Placed This Encounter  Procedures  . MYOCARDIAL PERFUSION IMAGING   No orders of the defined types were placed in this encounter.   Patient Instructions  Medication Instructions:  None If you need a refill on your cardiac medications before your next appointment, please call your pharmacy.   Lab work: None   Testing/Procedures: Your physician has requested that you have a lexiscan myoview. For further information please visit [email or web address]. Please follow instruction sheet, as given. [place] [address]  Follow-Up: At [facility], you and your health needs are our priority.  As part of our continuing mission to provide you with exceptional heart care, we have created designated Provider Care Teams.   These Care Teams include your primary Cardiologist (physician) and Advanced Practice Providers (APPs -  Physician Assistants  and Nurse Practitioners) who all work together to provide you with the care you need, when you need it. You will need a follow up appointment in 1 year.  Please call our office 2 months in advance to schedule this appointment.  You may see Dr. Harrell Gave or one of the following Advanced Practice Providers on your designated Care Team:   Rosaria Ferries, PA-C . Jory Sims, DNP, ANP  Any Other Special Instructions Will Be Listed Below (If Applicable).       Signed, Buford Dresser, MD PhD 07/28/2018 3:53 PM    [place]

## 2018-07-28 NOTE — Patient Instructions (Signed)
Medication Instructions:  None If you need a refill on your cardiac medications before your next appointment, please call your pharmacy.   Lab work: None   Testing/Procedures: Your physician has requested that you have a lexiscan myoview. For further information please visit [email or web address] Please follow instruction sheet, as given. [address]  Follow-Up: At [facility], you and your health needs are our priority.  As part of our continuing mission to provide you with exceptional heart care, we have created designated Provider Care Teams.  These Care Teams include your primary Cardiologist (physician) and Advanced Practice Providers (APPs -  Physician Assistants and Nurse Practitioners) who all work together to provide you with the care you need, when you need it. You will need a follow up appointment in 1 year.  Please call our office 2 months in advance to schedule this appointment.  You may see Dr. Cristal Deer or one of the following Advanced Practice Providers on your designated Care Team:   Theodore Demark, PA-C . Joni Reining, DNP, ANP  Any Other Special Instructions Will Be Listed Below (If Applicable).

## 2018-07-29 ENCOUNTER — Encounter: Payer: Self-pay | Admitting: Cardiology

## 2018-08-04 ENCOUNTER — Telehealth: Payer: Self-pay | Admitting: Family Medicine

## 2018-08-04 DIAGNOSIS — R05 Cough: Secondary | ICD-10-CM

## 2018-08-04 DIAGNOSIS — R059 Cough, unspecified: Secondary | ICD-10-CM

## 2018-08-04 MED ORDER — PROMETHAZINE-DM 6.25-15 MG/5ML PO SYRP: 5.0000 mL | ORAL_SOLUTION | Freq: Four times a day (QID) | ORAL | 0 refills | Status: DC | PRN

## 2018-08-04 NOTE — Telephone Encounter (Signed)
Looks like you gave her promethazine dm in the past.  Do you want her to come in?

## 2018-08-04 NOTE — Telephone Encounter (Signed)
Any fevers?  Productive cough?  --- if so needs ov Other wise ok to refill phenergan dm x1 only

## 2018-08-04 NOTE — Progress Notes (Deleted)
Office Visit Note  Patient: [name]             Date of Birth: 09-28-1946           MRN: [id number]             PCP: Donato Schultz, DO Referring: Donato Schultz, * Visit Date: 08/13/2018 Occupation: @GUAROCC @  Subjective:  No chief complaint on file.   History of Present Illness: [name] is a 72 y.o. female ***   Activities of Daily Living:  Patient reports morning stiffness for *** {minute/hour:19697}.   Patient {ACTIONS;DENIES/REPORTS:21021675::"Denies"} nocturnal pain.  Difficulty dressing/grooming: {ACTIONS;DENIES/REPORTS:21021675::"Denies"} Difficulty climbing stairs: {ACTIONS;DENIES/REPORTS:21021675::"Denies"} Difficulty getting out of chair: {ACTIONS;DENIES/REPORTS:21021675::"Denies"} Difficulty using hands for taps, buttons, cutlery, and/or writing: {ACTIONS;DENIES/REPORTS:21021675::"Denies"}  No Rheumatology ROS completed.   PMFS History:  Patient Active Problem List   Diagnosis Date Noted  . Elevated rheumatoid factor 07/21/2018  . Gastroenteritis 07/21/2018  . Trigger middle finger of left hand 12/19/2017  . Ganglion cyst of dorsum of right wrist 12/19/2017  . Morton's neuroma of right foot 12/19/2017  . Preventative health care 12/13/2017  . Pain in both hands 12/13/2017  . Hyperlipidemia LDL goal <100 12/13/2017  . Abscess of upper gum 12/13/2017  . Spinal stenosis of cervical region 09/25/2016  . Cervical radiculopathy 09/06/2016  . Dyspnea 02/18/2016  . Chest pain 02/18/2016  . HLD (hyperlipidemia) 02/18/2016  . CAD in native artery 02/18/2016  . RLS (restless legs syndrome) 02/18/2016  . Pancreatitis 02/18/2016  . SOB (shortness of breath) 02/18/2016  . Uncontrolled type 2 diabetes mellitus with complication ([facility])   . Tobacco abuse   . Osteoarthritis of right knee 07/05/2015  . Smoking 07/02/2013  . Acute upper respiratory infections of unspecified site 07/02/2013  . Flu vaccine need 07/02/2013  . Multinodular  goiter 04/16/2013  . Bunion 07/07/2012  . BACK PAIN, THORACIC REGION, RIGHT 05/24/2009  . IRON DEFICIENCY 03/25/2009  . GERD 03/25/2009  . WEIGHT LOSS 03/25/2009  . SINUSITIS- ACUTE-NOS 11/25/2008  . Hyperlipidemia LDL goal <70 10/07/2008  . ACUTE PANCREATITIS 09/07/2008  . OVARIAN CYST 09/07/2008  . KNEE PAIN, RIGHT 05/24/2008  . CARPAL TUNNEL SYNDROME, RIGHT 05/12/2008  . Acute pain of right shoulder 04/02/2008  . ROTATOR CUFF REPAIR, RIGHT, HX OF 03/29/2008  . GOITER, MULTINODULAR 12/26/2007  . Depression 07/07/2007  . HIP PAIN, RIGHT 03/11/2007  . RESTLESS LEG SYNDROME, HX OF 03/11/2007  . Diabetes mellitus type II, uncontrolled (HCC) 03/07/2007  . Essential hypertension 03/07/2007    Past Medical History:  Diagnosis Date  . Blockage of coronary artery of heart ([facility])   . Depression   . Diabetes mellitus   . Hyperlipidemia   . Hypertension   . Pancreatitis   . Restless leg syndrome     Family History  Problem Relation Age of Onset  . Heart attack Father   . Lung cancer Unknown   . Thyroid cancer Unknown   . Uterine cancer Unknown   . Breast cancer Sister   . Cancer Other        lung,thyroid,breast,uterine  . Heart attack Sister   . Heart attack Brother 88   Past Surgical History:  Procedure Laterality Date  . CATARACT EXTRACTION, BILATERAL    . CORONARY ANGIOPLASTY WITH STENT PLACEMENT    . FOOT SURGERY Right    cyst removal  . ROTATOR CUFF REPAIR     right  . TUBAL LIGATION     Social History  Social History Narrative   Exercise: seldom   Caffeine use: seldom    Objective: Vital Signs: There were no vitals taken for this visit.   Physical Exam   Musculoskeletal Exam: ***  CDAI Exam: CDAI Score: Not documented Patient Global Assessment: Not documented; Provider Global Assessment: Not documented Swollen: Not documented; Tender: Not documented Joint Exam   Not documented   There is currently no information documented on the homunculus. Go  to the Rheumatology activity and complete the homunculus joint exam.  Investigation: Findings:  07/16/18: RF 82, ANA negative, sed rate 2, CRP 0.1, Vitamin B1 19, Vitamin B12 913, TSH 0.57  Component     Latest Ref Rng & Units 07/16/2018  TSH     0.35 - 4.50 uIU/mL 0.57  Vitamin B12     211 - 911 pg/mL 913 (H)  Vitamin B1 (Thiamine)     8 - 30 nmol/L 19  Sed Rate     0 - 30 mm/hr 2  CRP     0.5 - 20.0 mg/dL 0.1 (L)  Anti Nuclear Antibody(ANA)     NEGATIVE NEGATIVE  RA Latex Turbid.     <14 IU/mL 82 (H)   Imaging: Dg Chest 2 View  Result Date: 07/16/2018 CLINICAL DATA:  Cough EXAM: CHEST - 2 VIEW COMPARISON:  October 15, 2017 FINDINGS: There is no appreciable edema or consolidation. Heart size and pulmonary vascularity are within normal limits. There is aortic atherosclerosis. No adenopathy. There is mild degenerative change in the thoracic spine. There is mild rightward deviation of the upper thoracic trachea. IMPRESSION: No edema or consolidation.  Aortic atherosclerosis noted. Mild rightward deviation of the upper thoracic trachea, a finding raising concern for thyroid enlargement/potential goiter. Aortic Atherosclerosis (ICD10-I70.0). Electronically Signed   By: Bretta Bang III M.D.   On: 07/16/2018 11:00    Recent Labs: Lab Results  Component Value Date   WBC 6.6 07/16/2018   HGB 12.9 07/16/2018   PLT 177.0 07/16/2018   NA 141 07/16/2018   K 4.0 07/16/2018   CL 104 07/16/2018   CO2 31 07/16/2018   GLUCOSE 140 (H) 07/16/2018   BUN 16 07/16/2018   CREATININE 0.72 07/16/2018   BILITOT 0.7 07/16/2018   ALKPHOS 55 07/16/2018   AST 18 07/16/2018   ALT 21 07/16/2018   PROT 6.7 07/16/2018   ALBUMIN 4.2 07/16/2018   CALCIUM 9.7 07/16/2018   GFRAA >60 02/18/2016    Speciality Comments: No specialty comments available.  Procedures:  No procedures performed Allergies: Levemir [insulin detemir] and Metformin and related   Assessment / Plan:     Visit Diagnoses:  Rheumatoid factor positive  Myalgia  Primary osteoarthritis of right knee  S/P right rotator cuff repair  Cervical radiculopathy  Trigger middle finger of left hand  Morton's neuroma of right foot  Essential hypertension  CAD in native artery  History of gastroesophageal reflux (GERD)  History of pancreatitis  Multinodular goiter  History of type 2 diabetes mellitus  RLS (restless legs syndrome)  History of depression  History of hyperlipidemia   Orders: No orders of the defined types were placed in this encounter.  No orders of the defined types were placed in this encounter.   Face-to-face time spent with patient was *** minutes. Greater than 50% of time was spent in counseling and coordination of care.  Follow-Up Instructions: No follow-ups on file.   Gearldine Bienenstock, PA-C  Note - This record has been created using Dragon software.  Chart  creation errors have been sought, but may not always  have been located. Such creation errors do not reflect on  the standard of medical care.

## 2018-08-04 NOTE — Telephone Encounter (Signed)
Copied from CRM [id number]. Topic: Quick Communication - See Telephone Encounter >> Aug 04, 2018  9:21 AM Jolayne Haines L wrote: CRM for notification. See Telephone encounter for: 08/04/18.  Patient states she has been coughing all weekend, she states Dr [name] gave her a coughing syrup one time for coughing and she is requesting this again. Patient does not know the name of it and she rather me had send a message than make an appt. Advised her that I would send the message but she would most likely need to an appt.  Call back @ [phone]

## 2018-08-04 NOTE — Telephone Encounter (Signed)
Patient stated that she has no fevers, dry cough.  Medication sent in

## 2018-08-06 ENCOUNTER — Telehealth (HOSPITAL_COMMUNITY): Payer: Self-pay

## 2018-08-06 NOTE — Telephone Encounter (Signed)
Pt contacted with instructions for her stress test and she had to reschedule to 08/14/2018 at 8:00. S.Iceis Knab EMTP

## 2018-08-07 ENCOUNTER — Encounter (HOSPITAL_COMMUNITY): Payer: Self-pay

## 2018-08-11 ENCOUNTER — Ambulatory Visit (HOSPITAL_BASED_OUTPATIENT_CLINIC_OR_DEPARTMENT_OTHER)
Admission: RE | Admit: 2018-08-11 | Discharge: 2018-08-11 | Disposition: A | Discharge status: Home / Self Care | Payer: Medicare HMO | Source: Ambulatory Visit | Attending: Medical | Admitting: Medical

## 2018-08-11 ENCOUNTER — Other Ambulatory Visit: Payer: Self-pay | Admitting: Medical

## 2018-08-11 ENCOUNTER — Ambulatory Visit (INDEPENDENT_AMBULATORY_CARE_PROVIDER_SITE_OTHER): Payer: Medicare HMO | Admitting: Medical

## 2018-08-11 ENCOUNTER — Encounter: Payer: Self-pay | Admitting: Medical

## 2018-08-11 VITALS — BP 141/56 | HR 65 | Temp 98.8°F | Resp 16 | Ht 64.0 in | Wt 176.0 lb

## 2018-08-11 DIAGNOSIS — J40 Bronchitis, not specified as acute or chronic: Secondary | ICD-10-CM | POA: Diagnosis not present

## 2018-08-11 DIAGNOSIS — R05 Cough: Secondary | ICD-10-CM

## 2018-08-11 DIAGNOSIS — E010 Iodine-deficiency related diffuse (endemic) goiter: Secondary | ICD-10-CM | POA: Insufficient documentation

## 2018-08-11 DIAGNOSIS — F17200 Nicotine dependence, unspecified, uncomplicated: Secondary | ICD-10-CM

## 2018-08-11 DIAGNOSIS — R0602 Shortness of breath: Secondary | ICD-10-CM | POA: Diagnosis not present

## 2018-08-11 DIAGNOSIS — E042 Nontoxic multinodular goiter: Secondary | ICD-10-CM | POA: Diagnosis not present

## 2018-08-11 DIAGNOSIS — R059 Cough, unspecified: Secondary | ICD-10-CM

## 2018-08-11 MED ORDER — FLUTICASONE PROPIONATE 50 MCG/ACT NA SUSP: 2.0000 | Freq: Every day | NASAL | 1 refills | Status: DC

## 2018-08-11 MED ORDER — AZITHROMYCIN 250 MG PO TABS: ORAL_TABLET | ORAL | 0 refills | Status: DC

## 2018-08-11 MED ORDER — ALBUTEROL SULFATE HFA 108 (90 BASE) MCG/ACT IN AERS: 2.0000 | INHALATION_SPRAY | Freq: Four times a day (QID) | RESPIRATORY_TRACT | 2 refills | Status: DC | PRN

## 2018-08-11 MED ORDER — BENZONATATE 100 MG PO CAPS: 100.0000 mg | ORAL_CAPSULE | Freq: Three times a day (TID) | ORAL | 0 refills | Status: DC | PRN

## 2018-08-11 MED ORDER — FLUCONAZOLE 150 MG PO TABS: ORAL_TABLET | ORAL | 1 refills | Status: DC

## 2018-08-11 NOTE — Progress Notes (Signed)
Subjective:    Patient ID: [name], female    DOB: September 03, 1946, 72 y.o.   MRN: [id number]  HPI  Pt in with about 9 days of hacky cough with nasal congestion and chest congestion.   At beginning cough was dry. Now getting thick green mucus. When she blows her nose will get some green mucus.   No fever, no chills or sweats.   She is wheezing occasionally. Pt has not smoked for one week.  Pt also notes last 4-5 some tongue soreness.   On exam today noted thyroid felt enlarged. No pain in this area per pt.    Review of Systems  Constitutional: Negative for chills.  HENT: Positive for congestion and postnasal drip. Negative for sinus pressure, sinus pain, sore throat, trouble swallowing and voice change.        Tongue is sore.  Respiratory: Positive for cough and wheezing. Negative for choking.        Some throughout day. For about 5 days ago.  Cardiovascular: Negative for chest pain and palpitations.  Gastrointestinal: Negative for abdominal pain.  Musculoskeletal:       Thyromegaly.  Neurological: Negative for facial asymmetry, speech difficulty and numbness.  Hematological: Negative for adenopathy. Does not bruise/bleed easily.  Psychiatric/Behavioral: Negative for behavioral problems, confusion and dysphoric mood.    Past Medical History:  Diagnosis Date  . Blockage of coronary artery of heart ([place])   . Depression   . Diabetes mellitus   . Hyperlipidemia   . Hypertension   . Pancreatitis   . Restless leg syndrome      Social History   Socioeconomic History  . Marital status: Married    Spouse name: Not on file  . Number of children: Not on file  . Years of education: Not on file  . Highest education level: Not on file  Occupational History  . Not on file  Social Needs  . Financial resource strain: Not on file  . Food insecurity:    Worry: Not on file    Inability: Not on file  . Transportation needs:    Medical: Not on file    Non-medical: Not  on file  Tobacco Use  . Smoking status: Current Some Day Smoker    Packs/day: 0.20    Types: Cigarettes  . Smokeless tobacco: Never Used  Substance and Sexual Activity  . Alcohol use: No  . Drug use: No  . Sexual activity: Not on file  Lifestyle  . Physical activity:    Days per week: Not on file    Minutes per session: Not on file  . Stress: Not on file  Relationships  . Social connections:    Talks on phone: Not on file    Gets together: Not on file    Attends religious service: Not on file    Active member of club or organization: Not on file    Attends meetings of clubs or organizations: Not on file    Relationship status: Not on file  . Intimate partner violence:    Fear of current or ex partner: Not on file    Emotionally abused: Not on file    Physically abused: Not on file    Forced sexual activity: Not on file  Other Topics Concern  . Not on file  Social History Narrative   Exercise: seldom   Caffeine use: seldom    Past Surgical History:  Procedure Laterality Date  . CATARACT EXTRACTION, BILATERAL    .  CORONARY ANGIOPLASTY WITH STENT PLACEMENT    . FOOT SURGERY Right    cyst removal  . ROTATOR CUFF REPAIR     right  . TUBAL LIGATION      Family History  Problem Relation Age of Onset  . Heart attack Father   . Lung cancer Unknown   . Thyroid cancer Unknown   . Uterine cancer Unknown   . Breast cancer Sister   . Cancer Other        lung,thyroid,breast,uterine  . Heart attack Sister   . Heart attack Brother 47    Allergies  Allergen Reactions  . Levemir [Insulin Detemir] Hives and Swelling  . Metformin And Related Other (See Comments)    Sick on stomach, sweaty, throw up, pain    Current Outpatient Medications on File Prior to Visit  Medication Sig Dispense Refill  . alendronate (FOSAMAX) 70 MG tablet take 1 tablet by mouth every week TAKE WITH FULL GLASS OF WATER ON AN EMPTY STOMACH 4 tablet 12  . ALPRAZolam (XANAX) 0.25 MG tablet Take 1  tablet by mouth three times a day if needed 60 tablet 0  . amLODipine (NORVASC) 10 MG tablet TAKE 1 TABLET BY MOUTH EVERY DAY 90 tablet 0  . aspirin EC 81 MG tablet Take 81 mg by mouth daily.    [name] atorvastatin (LIPITOR) 40 MG tablet Take 1 tablet (40 mg total) by mouth daily. 90 tablet 1  . Blood Glucose Calibration (TAI DOC CONTROL) NORMAL SOLN     . Blood Glucose Monitoring Suppl (ACCU-CHEK AVIVA PLUS) w/Device KIT Check blood sugar daily. E11.9 1 kit 0  . Carboxymethylcellulose Sodium (THERATEARS OP) Place 1 drop into both eyes daily as needed (dry eyes).    . Cyanocobalamin (VITAMIN B-12 PO) Take 1 tablet by mouth daily.    . ferrous sulfate 325 (65 FE) MG tablet Take 325 mg by mouth daily with breakfast.    . Flaxseed, Linseed, (FLAXSEED OIL PO) Take 1 capsule by mouth 2 (two) times daily.     [name] FLUoxetine (PROZAC) 20 MG capsule take 1 capsule by mouth once daily 30 capsule 5  . fluticasone (FLONASE) 50 MCG/ACT nasal spray Place 2 sprays into both nostrils daily. (Patient taking differently: Place 2 sprays into both nostrils daily as needed. ) 16 g 6  . gabapentin (NEURONTIN) 300 MG capsule Take 1 capsule (300 mg total) by mouth 2 (two) times daily. If tolerating may take 3 times per day if needed. 180 capsule 1  . GINKGO BILOBA COMPLEX PO Take 1 tablet by mouth daily.     [name] glucose blood (ACCU-CHEK AVIVA) test strip Check blood sugar four times daily. E11.9 400 each 3  . insulin regular (NOVOLIN R RELION) 100 units/mL injection Inject 0.3 mLs (30 Units total) into the skin daily as needed for high blood sugar (at noon if CBG >200 and will be eating). 10 mL   . Insulin Syringe-Needle U-100 (BD INSULIN SYRINGE U/F) 31G X 5/16" 0.3 ML MISC 10 Units by Does not apply route 3 (three) times daily before meals. 100 each 5  . Lancet Devices (LANCING DEVICE) MISC     . lisinopril (PRINIVIL,ZESTRIL) 10 MG tablet TAKE 1 TABLET BY MOUTH EVERY DAY 90 tablet 0  . metoprolol succinate (TOPROL-XL) 50 MG 24  hr tablet TAKE 1 TABLET BY MOUTH ONCE DAILY WITH FOOD 90 tablet 0  . Multiple Vitamin (MULTIVITAMIN WITH MINERALS) TABS Take 1 tablet by mouth daily.    [name]  NOVOLIN N RELION 100 UNIT/ML injection Inject 30-60 Units into the skin 2 (two) times daily. Inject 40 units subcutaneously before breakfast and 60 units at bedtime    . promethazine-dextromethorphan (PROMETHAZINE-DM) 6.25-15 MG/5ML syrup Take 5 mLs by mouth 4 (four) times daily as needed. 120 mL 0  . vitamin E 400 UNIT capsule Take 400 Units by mouth daily.     No current facility-administered medications on file prior to visit.     BP (!) 141/56   Pulse 65   Temp 98.8 F (37.1 C) (Oral)   Resp 16   Ht '5\' 4"'  (1.626 m)   Wt 176 lb (79.8 kg)   SpO2 100%   BMI 30.21 kg/m       Objective:   Physical Exam  General  Mental Status - Alert. General Appearance - Well groomed. Not in acute distress.  Skin Rashes- No Rashes.  HEENT Head- Normal. Ear Auditory Canal - Left- Normal. Right - Normal.Tympanic Membrane- Left- Normal. Right- Normal. Eye Sclera/Conjunctiva- Left- Normal. Right- Normal. Nose & Sinuses Nasal Mucosa- Left-  Boggy and Congested. Right-  Boggy and  Congested.Bilateral  No maxillary and no  frontal sinus pressure. Mouth & Throat Lips: Upper Lip- Normal: no dryness, cracking, pallor, cyanosis, or vesicular eruption. Lower Lip-Normal: no dryness, cracking, pallor, cyanosis or vesicular eruption. Buccal Mucosa- Bilateral- No Aphthous ulcers. Oropharynx- No Discharge or Erythema. Tonsils: Characteristics- Bilateral- No Erythema or Congestion. Size/Enlargement- Bilateral- No enlargement. Discharge- bilateral-None. Tongue-mild beefy red but no lesions.  Neck Neck- Supple. Diffuse enlarged thyroid gland area.    Chest and Lung Exam Auscultation: Breath Sounds:-Clear even and unlabored.  Cardiovascular Auscultation:Rythm- Regular, rate and rhythm. Murmurs & Other Heart Sounds:Ausculatation of the heart  reveal- No Murmurs.  Lymphatic Head & Neck General Head & Neck Lymphatics: Bilateral: Description- No Localized lymphadenopathy.       Assessment & Plan:  You appear to have bronchitis. Rest hydrate and tylenol for fever. I am prescribing cough medicine benzonatate, and azythromycin antibiotic. For nasal congestion rx flonase.[name]   Recommend a chest xray today.Please get.  For wheezing rx albuterol If using repeatedly let us know.  For tongue possible thrush rx diflucan with one refill  For enlarged thyroid get [name] of area as well as tsh with t4.  Follow up in 7-10 days or as needed  [employer], [employer]

## 2018-08-11 NOTE — Patient Instructions (Addendum)
You appear to have bronchitis. Rest hydrate and tylenol for fever. I am prescribing cough medicine benzonatate, and azythromycin antibiotic. For nasal congestion rx flonase.[name]   Recommend a chest xray today.Please get.  For wheezing rx albuterol If using repeatedly let us know.  For tongue possible thrush rx diflucan with one refill  For enlarged thyroid get [name] of area as well as tsh with t4.  Follow up in 7-10 days or as needed

## 2018-08-12 ENCOUNTER — Telehealth (HOSPITAL_COMMUNITY): Payer: Self-pay

## 2018-08-12 ENCOUNTER — Telehealth: Payer: Self-pay | Admitting: Medical

## 2018-08-12 DIAGNOSIS — E7800 Pure hypercholesterolemia, unspecified: Secondary | ICD-10-CM | POA: Diagnosis not present

## 2018-08-12 DIAGNOSIS — E010 Iodine-deficiency related diffuse (endemic) goiter: Secondary | ICD-10-CM

## 2018-08-12 DIAGNOSIS — I10 Essential (primary) hypertension: Secondary | ICD-10-CM | POA: Diagnosis not present

## 2018-08-12 DIAGNOSIS — E041 Nontoxic single thyroid nodule: Secondary | ICD-10-CM

## 2018-08-12 DIAGNOSIS — E1165 Type 2 diabetes mellitus with hyperglycemia: Secondary | ICD-10-CM | POA: Diagnosis not present

## 2018-08-12 DIAGNOSIS — E049 Nontoxic goiter, unspecified: Secondary | ICD-10-CM | POA: Diagnosis not present

## 2018-08-12 LAB — TSH: TSH: 0.58 u[IU]/mL (ref 0.35–4.50)

## 2018-08-12 LAB — CBC WITH DIFFERENTIAL/PLATELET
Basophils Absolute: 0.1 10*3/uL (ref 0.0–0.1)
Basophils Relative: 0.8 % (ref 0.0–3.0)
EOS ABS: 0.1 10*3/uL (ref 0.0–0.7)
EOS PCT: 1.2 % (ref 0.0–5.0)
HCT: 36.9 % (ref 36.0–46.0)
Hemoglobin: 12.3 g/dL (ref 12.0–15.0)
Lymphocytes Relative: 37.9 % (ref 12.0–46.0)
Lymphs Abs: 4.3 10*3/uL — ABNORMAL HIGH (ref 0.7–4.0)
MCHC: 33.4 g/dL (ref 30.0–36.0)
MCV: 90.4 fl (ref 78.0–100.0)
MONO ABS: 0.9 10*3/uL (ref 0.1–1.0)
Monocytes Relative: 8.3 % (ref 3.0–12.0)
Neutro Abs: 5.8 10*3/uL (ref 1.4–7.7)
Neutrophils Relative %: 51.8 % (ref 43.0–77.0)
Platelets: 221 10*3/uL (ref 150.0–400.0)
RBC: 4.08 Mil/uL (ref 3.87–5.11)
RDW: 13.5 % (ref 11.5–15.5)
WBC: 11.2 10*3/uL — AB (ref 4.0–10.5)

## 2018-08-12 LAB — T4, FREE: FREE T4: 0.74 ng/dL (ref 0.60–1.60)

## 2018-08-12 NOTE — Telephone Encounter (Signed)
Referral to ENT placed

## 2018-08-13 ENCOUNTER — Ambulatory Visit: Payer: Medicare HMO | Admitting: Rheumatology

## 2018-08-14 ENCOUNTER — Encounter (HOSPITAL_COMMUNITY): Payer: Self-pay

## 2018-08-18 ENCOUNTER — Other Ambulatory Visit: Payer: Self-pay | Admitting: Family Medicine

## 2018-08-28 NOTE — Progress Notes (Deleted)
Office Visit Note  Patient: [name]             Date of Birth: August 23, 1946           MRN: [id number]             PCP: Donato Schultz, DO Referring: Donato Schultz, * Visit Date: 09/11/2018 Occupation: @GUAROCC @  Subjective:  No chief complaint on file.   History of Present Illness: [name] is a 72 y.o. female ***   Activities of Daily Living:  Patient reports morning stiffness for *** {minute/hour:19697}.   Patient {ACTIONS;DENIES/REPORTS:21021675::"Denies"} nocturnal pain.  Difficulty dressing/grooming: {ACTIONS;DENIES/REPORTS:21021675::"Denies"} Difficulty climbing stairs: {ACTIONS;DENIES/REPORTS:21021675::"Denies"} Difficulty getting out of chair: {ACTIONS;DENIES/REPORTS:21021675::"Denies"} Difficulty using hands for taps, buttons, cutlery, and/or writing: {ACTIONS;DENIES/REPORTS:21021675::"Denies"}  No Rheumatology ROS completed.   PMFS History:  Patient Active Problem List   Diagnosis Date Noted  . Elevated rheumatoid factor 07/21/2018  . Gastroenteritis 07/21/2018  . Trigger middle finger of left hand 12/19/2017  . Ganglion cyst of dorsum of right wrist 12/19/2017  . Morton's neuroma of right foot 12/19/2017  . Preventative health care 12/13/2017  . Pain in both hands 12/13/2017  . Hyperlipidemia LDL goal <100 12/13/2017  . Abscess of upper gum 12/13/2017  . Spinal stenosis of cervical region 09/25/2016  . Cervical radiculopathy 09/06/2016  . Dyspnea 02/18/2016  . Chest pain 02/18/2016  . HLD (hyperlipidemia) 02/18/2016  . CAD in native artery 02/18/2016  . RLS (restless legs syndrome) 02/18/2016  . Pancreatitis 02/18/2016  . SOB (shortness of breath) 02/18/2016  . Uncontrolled type 2 diabetes mellitus with complication ([facility])   . Tobacco abuse   . Osteoarthritis of right knee 07/05/2015  . Smoking 07/02/2013  . Acute upper respiratory infections of unspecified site 07/02/2013  . Flu vaccine need 07/02/2013  . Multinodular  goiter 04/16/2013  . Bunion 07/07/2012  . BACK PAIN, THORACIC REGION, RIGHT 05/24/2009  . IRON DEFICIENCY 03/25/2009  . GERD 03/25/2009  . WEIGHT LOSS 03/25/2009  . SINUSITIS- ACUTE-NOS 11/25/2008  . Hyperlipidemia LDL goal <70 10/07/2008  . ACUTE PANCREATITIS 09/07/2008  . OVARIAN CYST 09/07/2008  . KNEE PAIN, RIGHT 05/24/2008  . CARPAL TUNNEL SYNDROME, RIGHT 05/12/2008  . Acute pain of right shoulder 04/02/2008  . ROTATOR CUFF REPAIR, RIGHT, HX OF 03/29/2008  . GOITER, MULTINODULAR 12/26/2007  . Depression 07/07/2007  . HIP PAIN, RIGHT 03/11/2007  . RESTLESS LEG SYNDROME, HX OF 03/11/2007  . Diabetes mellitus type II, uncontrolled ([facility]) 03/07/2007  . Essential hypertension 03/07/2007    Past Medical History:  Diagnosis Date  . Blockage of coronary artery of heart ([facility])   . Depression   . Diabetes mellitus   . Hyperlipidemia   . Hypertension   . Pancreatitis   . Restless leg syndrome     Family History  Problem Relation Age of Onset  . Heart attack Father   . Lung cancer Unknown   . Thyroid cancer Unknown   . Uterine cancer Unknown   . Breast cancer Sister   . Cancer Other        lung,thyroid,breast,uterine  . Heart attack Sister   . Heart attack Brother 18   Past Surgical History:  Procedure Laterality Date  . CATARACT EXTRACTION, BILATERAL    . CORONARY ANGIOPLASTY WITH STENT PLACEMENT    . FOOT SURGERY Right    cyst removal  . ROTATOR CUFF REPAIR     right  . TUBAL LIGATION     Social History  Social History Narrative   Exercise: seldom   Caffeine use: seldom    Objective: Vital Signs: There were no vitals taken for this visit.   Physical Exam   Musculoskeletal Exam: ***  CDAI Exam: CDAI Score: Not documented Patient Global Assessment: Not documented; Provider Global Assessment: Not documented Swollen: Not documented; Tender: Not documented Joint Exam   Not documented   There is currently no information documented on the homunculus. Go  to the Rheumatology activity and complete the homunculus joint exam.  Investigation: Findings:  07/16/18: vitamin B12 913, Vitamin B1 19, Sed rate 2, CRP 0.1, ANA negative, RF 82 07/18/18: HgbA1c 10 08/11/18: TSH 0.58, T4 0.74  Component     Latest Ref Rng & Units 07/16/2018 07/18/2018 08/11/2018  Hemoglobin A1C     <5.7 % of total Hgb  10.0 (H)   Mean Plasma Glucose     (calc)  240   eAG (mmol/L)     (calc)  13.3   Vitamin B12     211 - 911 pg/mL 913 (H)    Vitamin B1 (Thiamine)     8 - 30 nmol/L 19    Sed Rate     0 - 30 mm/hr 2    CRP     0.5 - 20.0 mg/dL 0.1 (L)    Anti Nuclear Antibody(ANA)     NEGATIVE NEGATIVE    RA Latex Turbid.     <14 IU/mL 82 (H)    TSH     0.35 - 4.50 uIU/mL   0.58  T4,Free(Direct)     0.60 - 1.60 ng/dL   7.82   Imaging: Dg Chest 2 View  Result Date: 08/11/2018 CLINICAL DATA:  Cough, congestion and shortness of breath for 9 days. EXAM: CHEST - 2 VIEW COMPARISON:  07/16/2018 FINDINGS: The cardiac silhouette, mediastinal and hilar contours are within normal limits and stable. The lungs are clear of an acute process. No infiltrates or effusions. No worrisome pulmonary lesions. The bony thorax is intact. IMPRESSION: No acute cardiopulmonary findings. Electronically Signed   By: Rudie Meyer M.D.   On: 08/11/2018 14:55   US Thyroid  Result Date: 08/11/2018 CLINICAL DATA:  Palpable abnormality. Anterior neck and thyroid enlargement EXAM: THYROID ULTRASOUND TECHNIQUE: Ultrasound examination of the thyroid gland and adjacent soft tissues was performed. COMPARISON:  01/12/2015 FINDINGS: Parenchymal Echotexture: Markedly heterogenous Isthmus: 1.2 cm, previously 1.1 cm Right lobe: 7.3 x 3.3 x 3.4 cm, previously 7.5 x 3.6 x 3.6 cm Left lobe: 10.4 x 4.7 x 5.0 cm, previously 9.0 x 3.8 x 4.7 cm _________________________________________________________ Estimated total number of nodules >/= 1 cm: 6-10 Number of spongiform nodules >/=  2 cm not described below (TR1): 0  Number of mixed cystic and solid nodules >/= 1.5 cm not described below (TR2): 0 _________________________________________________________ Nodule # 1: Prior biopsy: No Location: Right; Superior Maximum size: 2.3 cm; Other 2 dimensions: 2.2 x 2.0 cm, previously, 2.3 x 1.8 x 2.4 cm Composition: solid/almost completely solid (2) Echogenicity: isoechoic (1) Shape: not taller-than-wide (0) Margins: smooth (0) Echogenic foci: none (0) ACR TI-RADS total points: 3. ACR TI-RADS risk category:  TR3 (3 points). Significant change in size (>/= 20% in two dimensions and minimal increase of 2 mm): No Change in features: No Change in ACR TI-RADS risk category: No ACR TI-RADS recommendations: *Given size (>/= 1.5 - 2.4 cm) and appearance, a follow-up ultrasound in 1 year should be considered based on TI-RADS criteria. _________________________________________________________ Nodule # 2: Prior biopsy: No Location:  Right; Superior Maximum size: 1.8 cm; Other 2 dimensions: 1.6 x 1.7 cm, previously, 2.1 x 1.7 x 1.3 cm Composition: solid/almost completely solid (2) Echogenicity: isoechoic (1) Shape: not taller-than-wide (0) Margins: smooth (0) Echogenic foci: none (0) ACR TI-RADS total points: 3. ACR TI-RADS risk category:  TR3 (3 points). Significant change in size (>/= 20% in two dimensions and minimal increase of 2 mm): No Change in features: No Change in ACR TI-RADS risk category: No ACR TI-RADS recommendations: *Given size (>/= 1.5 - 2.4 cm) and appearance, a follow-up ultrasound in 1 year should be considered based on TI-RADS criteria. _________________________________________________________ Nodule # 4: Prior biopsy: No Location: Isthmus; Mid Maximum size: 1.4 cm; Other 2 dimensions: 1.1 x 1.1 cm, previously, 1.3 x 1.2 x 1.1 cm Composition: solid/almost completely solid (2) Echogenicity: isoechoic (1) Shape: not taller-than-wide (0) Margins: smooth (0) Echogenic foci: none (0) ACR TI-RADS total points: 3. ACR TI-RADS risk  category:  TR3 (3 points). Significant change in size (>/= 20% in two dimensions and minimal increase of 2 mm): No Change in features: No Change in ACR TI-RADS risk category: No ACR TI-RADS recommendations: Given size (<1.4 cm) and appearance, this nodule does NOT meet TI-RADS criteria for biopsy or dedicated follow-up. _________________________________________________________ Nodule # 5: Prior biopsy: No Location: Isthmus; Mid Maximum size: 3.6 cm; Other 2 dimensions: 1.9 x 1.6 cm, previously, 2.0 x 1.7 x 1.4 cm Composition: solid/almost completely solid (2) Echogenicity: isoechoic (1) Shape: taller-than-wide (3) Margins: smooth (0) Echogenic foci: none (0) ACR TI-RADS total points: 6. ACR TI-RADS risk category:  TR4 (4-6 points). Significant change in size (>/= 20% in two dimensions and minimal increase of 2 mm): Yes Change in features: No Change in ACR TI-RADS risk category: No ACR TI-RADS recommendations: **Given size (>/= 1.5 cm) and appearance, fine needle aspiration of this moderately suspicious nodule should be considered based on TI-RADS criteria. _________________________________________________________ Nodule # 6: Prior biopsy: No Location: Left; Superior Maximum size: 2.3 cm; Other 2 dimensions: 1.9 x 2.0 cm, previously, 2.2 x 2.1 x 1.8 cm Composition: solid/almost completely solid (2) Echogenicity: isoechoic (1) Shape: not taller-than-wide (0) Margins: smooth (0) Echogenic foci: none (0) ACR TI-RADS total points: 3. ACR TI-RADS risk category:  TR3 (3 points). Significant change in size (>/= 20% in two dimensions and minimal increase of 2 mm): No Change in features: No Change in ACR TI-RADS risk category: No ACR TI-RADS recommendations: *Given size (>/= 1.5 - 2.4 cm) and appearance, a follow-up ultrasound in 1 year should be considered based on TI-RADS criteria. _________________________________________________________ Nodule # 7: Prior biopsy: No Location: Left; Superior Maximum size: 2.6 cm; Other 2  dimensions: 2.4 x 1.4 cm, previously, 2.6 x 1.9 x 1.8 cm Composition: solid/almost completely solid (2) Echogenicity: isoechoic (1) Shape: not taller-than-wide (0) Margins: smooth (0) Echogenic foci: none (0) ACR TI-RADS total points: 3. ACR TI-RADS risk category:  TR3 (3 points). Significant change in size (>/= 20% in two dimensions and minimal increase of 2 mm): No Change in features: No Change in ACR TI-RADS risk category: No ACR TI-RADS recommendations: **Given size (>/= 2.5 cm) and appearance, fine needle aspiration of this mildly suspicious nodule should be considered based on TI-RADS criteria. _________________________________________________________ Nodule # 9: Location: Left; Inferior Maximum size: 1.6 cm; Other 2 dimensions: 1.3 x 1.3 cm Composition: solid/almost completely solid (2) Echogenicity: isoechoic (1) Shape: not taller-than-wide (0) Margins: smooth (0) Echogenic foci: none (0) ACR TI-RADS total points: 3. ACR TI-RADS risk category: TR3 (3 points). ACR TI-RADS  recommendations: Given size (<1.4 cm) and appearance, this nodule does NOT meet TI-RADS criteria for biopsy or dedicated follow-up. _________________________________________________________ Right lower nodule 3 measures 4.7 x 3.0 x 3.4 cm and previously measured 3.5 x 2.6 x 4.0 cm. Biopsy was performed 03/14/2009. Left lower nodule 8 measures 7.5 x 3.9 x 6.1 cm and previously measured 4.2 x 5.5 x 3.7 cm. Biopsy was performed 03/14/2009. IMPRESSION: Nodule 1 is stable and meets criteria for annual follow-up Nodule 2 is stable and meets criteria for annual follow-up Nodule 3 previously underwent biopsy. Correlation with prior biopsy results is recommended. Nodule 4 does not meet criteria for biopsy nor follow-up Nodule 5 has significantly enlarged and meets criteria for fine needle aspiration biopsy. Nodule 6 is stable and meets criteria for annual follow-up. Nodule 7 is stable and continues to meet criteria for fine needle aspiration biopsy.  Nodule 8 previously underwent biopsy. Correlation with prior biopsy results is recommended. Nodule 9 does not meet criteria for biopsy nor follow-up. The above is in keeping with the ACR TI-RADS recommendations - J Am Coll Radiol 2017;14:587-595. Electronically Signed   By: Jolaine Click M.D.   On: 08/11/2018 16:11    Recent Labs: Lab Results  Component Value Date   WBC 11.2 (H) 08/11/2018   HGB 12.3 08/11/2018   PLT 221.0 08/11/2018   NA 141 07/16/2018   K 4.0 07/16/2018   CL 104 07/16/2018   CO2 31 07/16/2018   GLUCOSE 140 (H) 07/16/2018   BUN 16 07/16/2018   CREATININE 0.72 07/16/2018   BILITOT 0.7 07/16/2018   ALKPHOS 55 07/16/2018   AST 18 07/16/2018   ALT 21 07/16/2018   PROT 6.7 07/16/2018   ALBUMIN 4.2 07/16/2018   CALCIUM 9.7 07/16/2018   GFRAA >60 02/18/2016    Speciality Comments: No specialty comments available.  Procedures:  No procedures performed Allergies: Levemir [insulin detemir] and Metformin and related   Assessment / Plan:     Visit Diagnoses: Rheumatoid factor positive  Myalgia  Primary osteoarthritis of right knee  Trigger middle finger of left hand  Morton's neuroma of right foot  Cervical radiculopathy  S/P right rotator cuff repair  Essential hypertension  CAD in native artery  History of gastroesophageal reflux (GERD)  History of pancreatitis  Uncontrolled type 2 diabetes mellitus with complication (HCC)  History of diabetes mellitus, type II  RLS (restless legs syndrome)  History of hyperlipidemia  History of depression   Orders: No orders of the defined types were placed in this encounter.  No orders of the defined types were placed in this encounter.   Face-to-face time spent with patient was *** minutes. Greater than 50% of time was spent in counseling and coordination of care.  Follow-Up Instructions: No follow-ups on file.   Gearldine Bienenstock, PA-C  Note - This record has been created using Dragon software.    Chart creation errors have been sought, but may not always  have been located. Such creation errors do not reflect on  the standard of medical care.

## 2018-09-11 ENCOUNTER — Ambulatory Visit: Payer: Medicare HMO | Admitting: Rheumatology

## 2018-09-15 ENCOUNTER — Telehealth (HOSPITAL_COMMUNITY): Payer: Self-pay | Admitting: *Deleted

## 2018-09-15 NOTE — Telephone Encounter (Signed)
Patient given detailed instructions per Myocardial Perfusion Study Information Sheet for the test on 09/18/18 at 1000. Patient notified to arrive 15 minutes early and that it is imperative to arrive on time for appointment to keep from having the test rescheduled.  If you need to cancel or reschedule your appointment, please call the office within 24 hours of your appointment. . Patient verbalized understanding.Dariusz Brase, Adelene IdlerCynthia W

## 2018-09-17 ENCOUNTER — Encounter (HOSPITAL_COMMUNITY): Payer: Self-pay

## 2018-09-18 ENCOUNTER — Ambulatory Visit (HOSPITAL_COMMUNITY): Payer: Medicare HMO | Attending: Internal Medicine

## 2018-09-18 DIAGNOSIS — R079 Chest pain, unspecified: Secondary | ICD-10-CM

## 2018-09-18 LAB — MYOCARDIAL PERFUSION IMAGING
CHL CUP NUCLEAR SRS: 6
CHL CUP NUCLEAR SSS: 11
LV dias vol: 87 mL (ref 46–106)
LVSYSVOL: 34 mL
NUC STRESS TID: 1.23
Peak HR: 68 {beats}/min
Rest HR: 53 {beats}/min
SDS: 4

## 2018-09-18 MED ORDER — REGADENOSON 0.4 MG/5ML IV SOLN
0.4000 mg | Freq: Once | INTRAVENOUS | Status: AC
  Administered 2018-09-18: 0.4 mg via INTRAVENOUS

## 2018-09-18 MED ORDER — TECHNETIUM TC 99M TETROFOSMIN IV KIT
10.1000 | PACK | Freq: Once | INTRAVENOUS | Status: AC | PRN
  Administered 2018-09-18: 10.1 via INTRAVENOUS
  Filled 2018-09-18: qty 11

## 2018-09-18 MED ORDER — TECHNETIUM TC 99M TETROFOSMIN IV KIT
32.7000 | PACK | Freq: Once | INTRAVENOUS | Status: AC | PRN
  Administered 2018-09-18: 32.7 via INTRAVENOUS
  Filled 2018-09-18: qty 33

## 2018-10-04 ENCOUNTER — Other Ambulatory Visit: Payer: Self-pay | Admitting: Family Medicine

## 2018-10-09 NOTE — Progress Notes (Signed)
Office Visit Note  Patient: [name]             Date of Birth: 12/31/1945           MRN: [id number]             PCP: Donato SchultzLowne Chase, Yvonne R, DO Referring: Donato SchultzLowne Chase, Yvonne R, * Visit Date: 10/21/2018 Occupation: Part-time CNA  Subjective:  Right knee pain.   History of Present Illness: [name] is a 73 y.o. female in consultation per request of her PCP.  According to patient she has started having right knee joint discomfort about 5 years ago.  She states the pain has been off and on initially.  She recalls about 4 years ago Dr. Eulah PontMurphy aspirated her right knee joint.  The pain has become more severe in the last 3 years.  She states she has been having increased discomfort recently and difficulty walking.  She is also noticed swelling in her knee joint off and on.  She has had problems with degenerative disc disease of cervical spine with spinal stenosis.  She has had cortisone injections by Dr. Alvester MorinNewton in the past.  She also gives history of lower back pain.  She states her right rotator cuff has been repaired in the past and it is better now.  She also has some discomfort in her right third trigger finger and right wrist joint off and on.  None of the other joints are painful or swollen.  Activities of Daily Living:  Patient reports morning stiffness for 5 minutes.   Patient Reports nocturnal pain.  Difficulty dressing/grooming: Denies Difficulty climbing stairs: Reports Difficulty getting out of chair: Denies Difficulty using hands for taps, buttons, cutlery, and/or writing: Denies  Review of Systems  Constitutional: Positive for fatigue. Negative for night sweats, weight gain and weight loss.  HENT: Positive for mouth dryness. Negative for mouth sores, trouble swallowing, trouble swallowing and nose dryness.   Eyes: Positive for dryness. Negative for pain, redness and visual disturbance.  Respiratory: Negative for cough, shortness of breath and difficulty  breathing.   Cardiovascular: Positive for palpitations. Negative for chest pain, hypertension, irregular heartbeat and swelling in legs/feet.  Gastrointestinal: Negative for blood in stool, constipation and diarrhea.  Endocrine: Negative for increased urination.  Genitourinary: Negative for vaginal dryness.  Musculoskeletal: Positive for arthralgias, joint pain, joint swelling, myalgias, morning stiffness and myalgias. Negative for muscle weakness and muscle tenderness.  Skin: Negative for color change, rash, hair loss, skin tightness, ulcers and sensitivity to sunlight.  Allergic/Immunologic: Negative for susceptible to infections.  Neurological: Negative for dizziness, memory loss, night sweats and weakness.  Hematological: Negative for swollen glands.  Psychiatric/Behavioral: Positive for depressed mood. Negative for sleep disturbance. The patient is not nervous/anxious.     PMFS History:  Patient Active Problem List   Diagnosis Date Noted  . Elevated rheumatoid factor 07/21/2018  . Gastroenteritis 07/21/2018  . Trigger middle finger of left hand 12/19/2017  . Ganglion cyst of dorsum of right wrist 12/19/2017  . Morton's neuroma of right foot 12/19/2017  . Preventative health care 12/13/2017  . Pain in both hands 12/13/2017  . Hyperlipidemia LDL goal <100 12/13/2017  . Abscess of upper gum 12/13/2017  . Spinal stenosis of cervical region 09/25/2016  . Cervical radiculopathy 09/06/2016  . Dyspnea 02/18/2016  . Chest pain 02/18/2016  . HLD (hyperlipidemia) 02/18/2016  . CAD in native artery 02/18/2016  . RLS (restless legs syndrome) 02/18/2016  . Pancreatitis 02/18/2016  .  SOB (shortness of breath) 02/18/2016  . Uncontrolled type 2 diabetes mellitus with complication (HCC)   . Tobacco abuse   . Osteoarthritis of right knee 07/05/2015  . Smoking 07/02/2013  . Acute upper respiratory infections of unspecified site 07/02/2013  . Flu vaccine need 07/02/2013  . Multinodular goiter  04/16/2013  . Bunion 07/07/2012  . BACK PAIN, THORACIC REGION, RIGHT 05/24/2009  . IRON DEFICIENCY 03/25/2009  . GERD 03/25/2009  . WEIGHT LOSS 03/25/2009  . SINUSITIS- ACUTE-NOS 11/25/2008  . Hyperlipidemia LDL goal <70 10/07/2008  . ACUTE PANCREATITIS 09/07/2008  . OVARIAN CYST 09/07/2008  . KNEE PAIN, RIGHT 05/24/2008  . CARPAL TUNNEL SYNDROME, RIGHT 05/12/2008  . Acute pain of right shoulder 04/02/2008  . ROTATOR CUFF REPAIR, RIGHT, HX OF 03/29/2008  . GOITER, MULTINODULAR 12/26/2007  . Depression 07/07/2007  . HIP PAIN, RIGHT 03/11/2007  . RESTLESS LEG SYNDROME, HX OF 03/11/2007  . Diabetes mellitus type II, uncontrolled (HCC) 03/07/2007  . Essential hypertension 03/07/2007    Past Medical History:  Diagnosis Date  . Blockage of coronary artery of heart (HCC)   . Depression   . Diabetes mellitus   . Hyperlipidemia   . Hypertension   . Pancreatitis   . Restless leg syndrome     Family History  Problem Relation Age of Onset  . Heart attack Father   . Heart attack Brother   . Diabetes Sister   . Cirrhosis Sister   . Hypertension Sister   . Cancer Mother   . Hypertension Mother   . Vision loss Mother   . Cancer Sister   . Sleep apnea Sister   . Heart Problems Sister   . Diabetes Sister   . Gout Sister   . Diabetes Son   . Heart attack Son   . Diabetes Daughter   . Goiter Daughter   . Diabetes Daughter    Past Surgical History:  Procedure Laterality Date  . CATARACT EXTRACTION, BILATERAL    . CORONARY ANGIOPLASTY WITH STENT PLACEMENT    . FOOT SURGERY Right    cyst removal  . ROTATOR CUFF REPAIR     right  . ROTATOR CUFF REPAIR Left   . TUBAL LIGATION     Social History   Social History Narrative   Exercise: seldom   Caffeine use: seldom    Objective: Vital Signs: BP (!) 159/73 (BP Location: Right Arm, Patient Position: Sitting, Cuff Size: Normal)   Pulse (!) 55   Resp 15   Ht 5\' 5"  (1.651 m)   Wt 180 lb 6.4 oz (81.8 kg)   BMI 30.02 kg/m      Physical Exam Vitals signs and nursing note reviewed.  Constitutional:      Appearance: She is well-developed.  HENT:     Head: Normocephalic and atraumatic.  Eyes:     Conjunctiva/sclera: Conjunctivae normal.  Neck:     Musculoskeletal: Normal range of motion.  Cardiovascular:     Rate and Rhythm: Normal rate and regular rhythm.     Heart sounds: Normal heart sounds.  Pulmonary:     Effort: Pulmonary effort is normal.     Breath sounds: Normal breath sounds.  Abdominal:     General: Bowel sounds are normal.     Palpations: Abdomen is soft.  Lymphadenopathy:     Cervical: No cervical adenopathy.  Skin:    General: Skin is warm and dry.     Capillary Refill: Capillary refill takes less than 2 seconds.  Neurological:  Mental Status: She is alert and oriented to person, place, and time.  Psychiatric:        Behavior: Behavior normal.      Musculoskeletal Exam: She has some limitation with range of motion of cervical spine without discomfort.  She has good range of motion of her shoulders and elbows.  She has mild tenderness on palpation of her right wrist joint.  No synovitis was noted.  No synovitis noted over MCPs PIPs.  She had painful range of motion of the right hip joint.  She had painful range above her right knee joint with some warmth.  Left knee joint was in good range of motion with discomfort.  Ankle joints MTPs PIPs been good range of motion with no synovitis.  CDAI Exam: CDAI Score: Not documented Patient Global Assessment: Not documented; Provider Global Assessment: Not documented Swollen: Not documented; Tender: Not documented Joint Exam   Not documented   There is currently no information documented on the homunculus. Go to the Rheumatology activity and complete the homunculus joint exam.  Investigation: Findings:  07/16/18: TSH 0.57, Vitamin B12 913, vitamin B1 19, sed rate 2, CRP 0.1, ANA negative, RF 82  Component     Latest Ref Rng & Units  07/16/2018  TSH     0.35 - 4.50 uIU/mL 0.57  Vitamin B12     211 - 911 pg/mL 913 (H)  Vitamin B1 (Thiamine)     8 - 30 nmol/L 19  Sed Rate     0 - 30 mm/hr 2  CRP     0.5 - 20.0 mg/dL 0.1 (L)  Anti Nuclear Antibody(ANA)     NEGATIVE NEGATIVE  RA Latex Turbid.     <14 IU/mL 82 (H)   Imaging: No results found.  Recent Labs: Lab Results  Component Value Date   WBC 11.2 (H) 08/11/2018   HGB 12.3 08/11/2018   PLT 221.0 08/11/2018   NA 141 07/16/2018   K 4.0 07/16/2018   CL 104 07/16/2018   CO2 31 07/16/2018   GLUCOSE 140 (H) 07/16/2018   BUN 16 07/16/2018   CREATININE 0.72 07/16/2018   BILITOT 0.7 07/16/2018   ALKPHOS 55 07/16/2018   AST 18 07/16/2018   ALT 21 07/16/2018   PROT 6.7 07/16/2018   ALBUMIN 4.2 07/16/2018   CALCIUM 9.7 07/16/2018   GFRAA >60 02/18/2016    Speciality Comments: No specialty comments available.  Procedures:  No procedures performed Allergies: Levemir [insulin detemir] and Metformin and related   Assessment / Plan:     Visit Diagnoses: Rheumatoid factor positive - 07/16/18: TSH 0.57, Vitamin B12 913, vitamin B1 19, sed rate 2, CRP 0.1, ANA negative, RF 82  Pain in right hand -she complains of pain and discomfort in her right wrist joint.  She has some tenderness but no synovitis was noted.  Plan: XR Hand 2 View Right.  The x-ray revealed second and third MCP narrowing consistent with inflammatory arthritis and also osteoarthritic changes.  Pain in right hip -she had painful range of motion of her right hip joint with some limitation.  Plan: XR HIP UNILAT W OR W/O PELVIS 2-3 VIEWS RIGHT.  The x-ray of the hip joint was unremarkable.  Trigger middle finger of left hand-currently not very symptomatic although she has thickening of the flexor tendon.  Chronic pain of both knees -she has discomfort in her bilateral knee joints.  Although she had warmth only in her right knee joint.  Plan: XR KNEE 3  VIEW RIGHT, XR KNEE 3 VIEW LEFT, x-ray of the  knee joint showed right knee joint moderate osteoarthritis.  Faint chondrocalcinosis was noted in bilateral knee joints., Cyclic citrul peptide antibody, IgG, 14-3-3 eta Protein, Uric acid  Chondrocalcinosis of bilateral knee joints  Primary osteoarthritis of right knee  Morton's neuroma of right foot-currently not symptomatic.  History of rotator cuff tear - right, s/p repair.  Doing well  Spinal stenosis of cervical region.  Patient states she has had cortisone injections but no surgery.  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  High risk medication use - Plan: Hepatitis B core antibody, IgM, Hepatitis B surface antigen, Hepatitis C antibody, Glucose 6 phosphate dehydrogenase, IgG, IgA, IgM, Serum protein electrophoresis with reflex, QuantiFERON-TB Gold Plus, HIV Antibody (routine testing w rflx)   Myalgia-she complains of some muscle pain in her lower extremities off and on.  Other medical problems are listed as follows:  History of coronary artery disease  Essential hypertension  History of gastroesophageal reflux (GERD)  History of pancreatitis  History of type 2 diabetes mellitus  History of hyperlipidemia  History of depression  RLS (restless legs syndrome)  Tobacco abuse-smoking cessation was discussed.    Orders: Orders Placed This Encounter  Procedures  . XR Hand 2 View Right  . XR KNEE 3 VIEW RIGHT  . XR KNEE 3 VIEW LEFT  . XR HIP UNILAT W OR W/O PELVIS 2-3 VIEWS RIGHT  . Cyclic citrul peptide antibody, IgG  . 14-3-3 eta Protein  . Hepatitis B core antibody, IgM  . Hepatitis B surface antigen  . Hepatitis C antibody  . Glucose 6 phosphate dehydrogenase  . IgG, IgA, IgM  . Serum protein electrophoresis with reflex  . QuantiFERON-TB Gold Plus  . HIV Antibody (routine testing w rflx)  . Uric acid   No orders of the defined types were placed in this encounter.   Face-to-face time spent with patient was 50 minutes. Greater than  50% of time was spent in counseling and coordination of care.  Follow-Up Instructions: Return for Arthralgia, +RF.   Pollyann Savoy, MD  Note - This record has been created using Animal nutritionist.  Chart creation errors have been sought, but may not always  have been located. Such creation errors do not reflect on  the standard of medical care.

## 2018-10-19 ENCOUNTER — Other Ambulatory Visit: Payer: Self-pay | Admitting: Family Medicine

## 2018-10-21 ENCOUNTER — Ambulatory Visit (INDEPENDENT_AMBULATORY_CARE_PROVIDER_SITE_OTHER): Payer: Medicare HMO

## 2018-10-21 ENCOUNTER — Ambulatory Visit: Payer: Medicare HMO | Admitting: Rheumatology

## 2018-10-21 ENCOUNTER — Encounter: Payer: Self-pay | Admitting: Rheumatology

## 2018-10-21 ENCOUNTER — Ambulatory Visit (INDEPENDENT_AMBULATORY_CARE_PROVIDER_SITE_OTHER): Payer: Self-pay

## 2018-10-21 VITALS — BP 159/73 | HR 55 | Resp 15 | Ht 65.0 in | Wt 180.4 lb

## 2018-10-21 DIAGNOSIS — Z79899 Other long term (current) drug therapy: Secondary | ICD-10-CM | POA: Diagnosis not present

## 2018-10-21 DIAGNOSIS — M25551 Pain in right hip: Secondary | ICD-10-CM | POA: Diagnosis not present

## 2018-10-21 DIAGNOSIS — M1120 Other chondrocalcinosis, unspecified site: Secondary | ICD-10-CM

## 2018-10-21 DIAGNOSIS — R768 Other specified abnormal immunological findings in serum: Secondary | ICD-10-CM | POA: Diagnosis not present

## 2018-10-21 DIAGNOSIS — Z8719 Personal history of other diseases of the digestive system: Secondary | ICD-10-CM

## 2018-10-21 DIAGNOSIS — M25562 Pain in left knee: Secondary | ICD-10-CM | POA: Diagnosis not present

## 2018-10-21 DIAGNOSIS — M25561 Pain in right knee: Secondary | ICD-10-CM | POA: Diagnosis not present

## 2018-10-21 DIAGNOSIS — G5761 Lesion of plantar nerve, right lower limb: Secondary | ICD-10-CM

## 2018-10-21 DIAGNOSIS — Z8679 Personal history of other diseases of the circulatory system: Secondary | ICD-10-CM

## 2018-10-21 DIAGNOSIS — Z8659 Personal history of other mental and behavioral disorders: Secondary | ICD-10-CM

## 2018-10-21 DIAGNOSIS — M65332 Trigger finger, left middle finger: Secondary | ICD-10-CM

## 2018-10-21 DIAGNOSIS — G8929 Other chronic pain: Secondary | ICD-10-CM | POA: Diagnosis not present

## 2018-10-21 DIAGNOSIS — M79641 Pain in right hand: Secondary | ICD-10-CM

## 2018-10-21 DIAGNOSIS — I10 Essential (primary) hypertension: Secondary | ICD-10-CM

## 2018-10-21 DIAGNOSIS — G2581 Restless legs syndrome: Secondary | ICD-10-CM

## 2018-10-21 DIAGNOSIS — M1711 Unilateral primary osteoarthritis, right knee: Secondary | ICD-10-CM

## 2018-10-21 DIAGNOSIS — Z8739 Personal history of other diseases of the musculoskeletal system and connective tissue: Secondary | ICD-10-CM

## 2018-10-21 DIAGNOSIS — M4802 Spinal stenosis, cervical region: Secondary | ICD-10-CM

## 2018-10-21 DIAGNOSIS — Z720 Tobacco use: Secondary | ICD-10-CM

## 2018-10-21 DIAGNOSIS — M791 Myalgia, unspecified site: Secondary | ICD-10-CM

## 2018-10-21 DIAGNOSIS — Z8639 Personal history of other endocrine, nutritional and metabolic disease: Secondary | ICD-10-CM

## 2018-10-25 LAB — IRON,TIBC AND FERRITIN PANEL
%SAT: 28 % (calc) (ref 16–45)
Ferritin: 587 ng/mL — ABNORMAL HIGH (ref 16–288)
IRON: 71 ug/dL (ref 45–160)
TIBC: 251 ug/dL (ref 250–450)

## 2018-10-25 LAB — PROTEIN ELECTROPHORESIS, SERUM, WITH REFLEX
Albumin ELP: 4.2 g/dL (ref 3.8–4.8)
Alpha 1: 0.2 g/dL (ref 0.2–0.3)
Alpha 2: 0.7 g/dL (ref 0.5–0.9)
BETA 2: 0.5 g/dL (ref 0.2–0.5)
Beta Globulin: 0.4 g/dL (ref 0.4–0.6)
GAMMA GLOBULIN: 0.8 g/dL (ref 0.8–1.7)
TOTAL PROTEIN: 6.7 g/dL (ref 6.1–8.1)

## 2018-10-25 LAB — 14-3-3 ETA PROTEIN: 14-3-3 eta Protein: <0.2 ng/mL (ref <0.2)

## 2018-10-25 LAB — CYCLIC CITRUL PEPTIDE ANTIBODY, IGG: Cyclic Citrullin Peptide Ab: <16 UNITS

## 2018-10-25 LAB — HIV ANTIBODY (ROUTINE TESTING W REFLEX): HIV 1&2 Ab, 4th Generation: NONREACTIVE

## 2018-10-25 LAB — HEPATITIS B CORE ANTIBODY, IGM: Hep B C IgM: NONREACTIVE

## 2018-10-25 LAB — QUANTIFERON-TB GOLD PLUS
Mitogen-NIL: >10 IU/mL
NIL: 0.04 [IU]/mL
QUANTIFERON-TB GOLD PLUS: NEGATIVE
TB1-NIL: 0.2 IU/mL
TB2-NIL: 0.21 IU/mL

## 2018-10-25 LAB — IGG, IGA, IGM
IGM, SERUM: 68 mg/dL (ref 50–300)
IMMUNOGLOBULIN A: 500 mg/dL — AB (ref 70–320)
IgG (Immunoglobin G), Serum: 706 mg/dL (ref 600–1540)

## 2018-10-25 LAB — URIC ACID: Uric Acid, Serum: 7.4 mg/dL — ABNORMAL HIGH (ref 2.5–7.0)

## 2018-10-25 LAB — HEPATITIS C ANTIBODY
HEP C AB: NONREACTIVE
SIGNAL TO CUT-OFF: 0.05 (ref <1.00)

## 2018-10-25 LAB — GLUCOSE 6 PHOSPHATE DEHYDROGENASE: G-6PDH: 13.4 U/g Hgb (ref 7.0–20.5)

## 2018-10-25 LAB — IFE INTERPRETATION: Immunofix Electr Int: NOT DETECTED

## 2018-10-25 LAB — HEPATITIS B SURFACE ANTIGEN: Hepatitis B Surface Ag: NONREACTIVE

## 2018-10-30 ENCOUNTER — Telehealth (INDEPENDENT_AMBULATORY_CARE_PROVIDER_SITE_OTHER): Payer: Self-pay | Admitting: Orthopaedic Surgery

## 2018-10-30 NOTE — Telephone Encounter (Signed)
Patient called requesting lab results.  Please call patient to advise.  [phone]

## 2018-10-31 DIAGNOSIS — E1121 Type 2 diabetes mellitus with diabetic nephropathy: Secondary | ICD-10-CM | POA: Diagnosis not present

## 2018-10-31 DIAGNOSIS — I10 Essential (primary) hypertension: Secondary | ICD-10-CM | POA: Diagnosis not present

## 2018-10-31 DIAGNOSIS — E1165 Type 2 diabetes mellitus with hyperglycemia: Secondary | ICD-10-CM | POA: Diagnosis not present

## 2018-10-31 DIAGNOSIS — R809 Proteinuria, unspecified: Secondary | ICD-10-CM | POA: Diagnosis not present

## 2018-10-31 DIAGNOSIS — E1140 Type 2 diabetes mellitus with diabetic neuropathy, unspecified: Secondary | ICD-10-CM | POA: Diagnosis not present

## 2018-10-31 DIAGNOSIS — E7800 Pure hypercholesterolemia, unspecified: Secondary | ICD-10-CM | POA: Diagnosis not present

## 2018-10-31 DIAGNOSIS — E049 Nontoxic goiter, unspecified: Secondary | ICD-10-CM | POA: Diagnosis not present

## 2018-12-29 ENCOUNTER — Ambulatory Visit (INDEPENDENT_AMBULATORY_CARE_PROVIDER_SITE_OTHER): Payer: Medicare HMO | Admitting: Family Medicine

## 2018-12-29 ENCOUNTER — Encounter: Payer: Self-pay | Admitting: Family Medicine

## 2018-12-29 ENCOUNTER — Other Ambulatory Visit: Payer: Self-pay

## 2018-12-29 VITALS — BP 138/62 | HR 61 | Temp 98.1°F | Resp 12 | Ht 64.0 in | Wt 178.8 lb

## 2018-12-29 DIAGNOSIS — J324 Chronic pansinusitis: Secondary | ICD-10-CM | POA: Diagnosis not present

## 2018-12-29 MED ORDER — AMOXICILLIN 875 MG PO TABS: 875.0000 mg | ORAL_TABLET | Freq: Two times a day (BID) | ORAL | 0 refills | Status: DC

## 2018-12-29 NOTE — Progress Notes (Signed)
Patient ID: [name], female    DOB: Dec 02, 1945  Age: 73 y.o. MRN: [id number]    Subjective:  Subjective  HPI [name] presents for L ear pain and sinus pressure/ congestion.   No cough  + tinnitus --- stopped this am   Review of Systems  Constitutional: Negative for appetite change, diaphoresis, fatigue and unexpected weight change.  HENT: Positive for ear pain, sinus pressure and sinus pain.   Eyes: Negative for pain, redness and visual disturbance.  Respiratory: Negative for cough, chest tightness, shortness of breath and wheezing.   Cardiovascular: Negative for chest pain, palpitations and leg swelling.  Endocrine: Negative for cold intolerance, heat intolerance, polydipsia, polyphagia and polyuria.  Genitourinary: Negative for difficulty urinating, dysuria and frequency.  Neurological: Negative for dizziness, light-headedness, numbness and headaches.    History Past Medical History:  Diagnosis Date   Blockage of coronary artery of heart (HCC)    Depression    Diabetes mellitus    Hyperlipidemia    Hypertension    Pancreatitis    Restless leg syndrome     She has a past surgical history that includes Tubal ligation; Rotator cuff repair; Coronary angioplasty with stent; Foot surgery (Right); Cataract extraction, bilateral; and Rotator cuff repair (Left).   Her family history includes Cancer in her mother and sister; Cirrhosis in her sister; Diabetes in her daughter, daughter, sister, sister, and son; Goiter in her daughter; Gout in her sister; Heart Problems in her sister; Heart attack in her brother, father, and son; Hypertension in her mother and sister; Sleep apnea in her sister; Vision loss in her mother.She reports that she has been smoking cigarettes. She has been smoking about 0.20 packs per day. She has never used smokeless tobacco. She reports that she does not drink alcohol or use drugs.  Current Outpatient Medications on File Prior to Visit    Medication Sig Dispense Refill   alendronate (FOSAMAX) 70 MG tablet take 1 tablet by mouth every week TAKE WITH FULL GLASS OF WATER ON AN EMPTY STOMACH 4 tablet 12   ALPRAZolam (XANAX) 0.25 MG tablet Take 1 tablet by mouth three times a day if needed 60 tablet 0   amLODipine (NORVASC) 10 MG tablet TAKE 1 TABLET BY MOUTH EVERY DAY 90 tablet 1   aspirin EC 81 MG tablet Take 81 mg by mouth daily.     atorvastatin (LIPITOR) 40 MG tablet Take 1 tablet (40 mg total) by mouth daily. 90 tablet 1   benzonatate (TESSALON) 100 MG capsule Take 1 capsule (100 mg total) by mouth 3 (three) times daily as needed for cough. 30 capsule 0   Blood Glucose Calibration (TAI DOC CONTROL) NORMAL SOLN      Blood Glucose Monitoring Suppl (ACCU-CHEK AVIVA PLUS) w/Device KIT Check blood sugar daily. E11.9 1 kit 0   Carboxymethylcellulose Sodium (THERATEARS OP) Place 1 drop into both eyes daily as needed (dry eyes).     Cyanocobalamin (VITAMIN B-12 PO) Take 1 tablet by mouth daily.     ferrous sulfate 325 (65 FE) MG tablet Take 325 mg by mouth daily with breakfast.     Flaxseed, Linseed, (FLAXSEED OIL PO) Take 1 capsule by mouth 2 (two) times daily.      fluconazole (DIFLUCAN) 150 MG tablet 1 tab po x 1 day 1 tablet 1   FLUoxetine (PROZAC) 20 MG capsule TAKE 1 CAPSULE BY MOUTH ONCE DAILY 90 capsule 1   fluticasone (FLONASE) 50 MCG/ACT nasal spray Place 2 sprays  into both nostrils daily. (Patient taking differently: Place 2 sprays into both nostrils daily as needed. ) 16 g 6   fluticasone (FLONASE) 50 MCG/ACT nasal spray Place 2 sprays into both nostrils daily. 16 g 1   gabapentin (NEURONTIN) 300 MG capsule Take 1 capsule (300 mg total) by mouth 2 (two) times daily. If tolerating may take 3 times per day if needed. 180 capsule 1   GINKGO BILOBA COMPLEX PO Take 1 tablet by mouth daily.      glucose blood (ACCU-CHEK AVIVA) test strip Check blood sugar four times daily. E11.9 400 each 3   insulin regular  (NOVOLIN R RELION) 100 units/mL injection Inject 0.3 mLs (30 Units total) into the skin daily as needed for high blood sugar (at noon if CBG >200 and will be eating). 10 mL    Insulin Syringe-Needle U-100 (BD INSULIN SYRINGE U/F) 31G X 5/16" 0.3 ML MISC 10 Units by Does not apply route 3 (three) times daily before meals. 100 each 5   Lancet Devices (LANCING DEVICE) MISC      lisinopril (PRINIVIL,ZESTRIL) 10 MG tablet TAKE 1 TABLET BY MOUTH EVERY DAY 90 tablet 1   metoprolol succinate (TOPROL-XL) 50 MG 24 hr tablet TAKE 1 TABLET BY MOUTH EVERY DAY WITH FOOD 90 tablet 1   Multiple Vitamin (MULTIVITAMIN WITH MINERALS) TABS Take 1 tablet by mouth daily.     NOVOLIN N RELION 100 UNIT/ML injection Inject 30-60 Units into the skin 2 (two) times daily. Inject 40 units subcutaneously before breakfast and 60 units at bedtime     vitamin E 400 UNIT capsule Take 400 Units by mouth daily.     No current facility-administered medications on file prior to visit.      Objective:  Objective  Physical Exam Vitals signs and nursing note reviewed.  Constitutional:      Appearance: She is well-developed.  HENT:     Right Ear: Tympanic membrane and external ear normal.     Left Ear: A middle ear effusion is present. Tympanic membrane is bulging.  Eyes:     General:        Right eye: No discharge.        Left eye: No discharge.     Conjunctiva/sclera: Conjunctivae normal.  Cardiovascular:     Rate and Rhythm: Normal rate and regular rhythm.     Heart sounds: Normal heart sounds. No murmur.  Pulmonary:     Effort: Pulmonary effort is normal. No respiratory distress.     Breath sounds: Normal breath sounds. No wheezing or rales.  Chest:     Chest wall: No tenderness.  Lymphadenopathy:     Cervical: Cervical adenopathy present.  Neurological:     Mental Status: She is alert and oriented to person, place, and time.    BP 138/62 (BP Location: Right Arm, Cuff Size: Normal)    Pulse 61    Temp 98.1  F (36.7 C) (Oral)    Resp 12    Ht '5\' 4"'  (1.626 m)    Wt 178 lb 12.8 oz (81.1 kg)    SpO2 98%    BMI 30.69 kg/m  Wt Readings from Last 3 Encounters:  12/29/18 178 lb 12.8 oz (81.1 kg)  10/21/18 180 lb 6.4 oz (81.8 kg)  09/18/18 175 lb (79.4 kg)     Lab Results  Component Value Date   WBC 11.2 (H) 08/11/2018   HGB 12.3 08/11/2018   HCT 36.9 08/11/2018   PLT 221.0 08/11/2018  GLUCOSE 140 (H) 07/16/2018   CHOL 135 12/13/2017   TRIG 209.0 (H) 12/13/2017   HDL 32.90 (L) 12/13/2017   LDLDIRECT 70.0 12/13/2017   LDLCALC 80 02/18/2016   ALT 21 07/16/2018   AST 18 07/16/2018   NA 141 07/16/2018   K 4.0 07/16/2018   CL 104 07/16/2018   CREATININE 0.72 07/16/2018   BUN 16 07/16/2018   CO2 31 07/16/2018   TSH 0.58 08/11/2018   INR 1.1 03/03/2009   HGBA1C 10.0 (H) 07/18/2018   MICROALBUR 1.7 06/23/2015    Dg Chest 2 View  Result Date: 08/11/2018 CLINICAL DATA:  Cough, congestion and shortness of breath for 9 days. EXAM: CHEST - 2 VIEW COMPARISON:  07/16/2018 FINDINGS: The cardiac silhouette, mediastinal and hilar contours are within normal limits and stable. The lungs are clear of an acute process. No infiltrates or effusions. No worrisome pulmonary lesions. The bony thorax is intact. IMPRESSION: No acute cardiopulmonary findings. Electronically Signed   By: Marijo Sanes M.D.   On: 08/11/2018 14:55   US Thyroid  Result Date: 08/11/2018 CLINICAL DATA:  Palpable abnormality. Anterior neck and thyroid enlargement EXAM: THYROID ULTRASOUND TECHNIQUE: Ultrasound examination of the thyroid gland and adjacent soft tissues was performed. COMPARISON:  01/12/2015 FINDINGS: Parenchymal Echotexture: Markedly heterogenous Isthmus: 1.2 cm, previously 1.1 cm Right lobe: 7.3 x 3.3 x 3.4 cm, previously 7.5 x 3.6 x 3.6 cm Left lobe: 10.4 x 4.7 x 5.0 cm, previously 9.0 x 3.8 x 4.7 cm _________________________________________________________ Estimated total number of nodules >/= 1 cm: 6-10 Number of  spongiform nodules >/=  2 cm not described below (TR1): 0 Number of mixed cystic and solid nodules >/= 1.5 cm not described below ([place]): 0 _________________________________________________________ Nodule # 1: Prior biopsy: No Location: Right; Superior Maximum size: 2.3 cm; Other 2 dimensions: 2.2 x 2.0 cm, previously, 2.3 x 1.8 x 2.4 cm Composition: solid/almost completely solid (2) Echogenicity: isoechoic (1) Shape: not taller-than-wide (0) Margins: smooth (0) Echogenic foci: none (0) ACR TI-RADS total points: 3. ACR TI-RADS risk category:  TR3 (3 points). Significant change in size (>/= 20% in two dimensions and minimal increase of 2 mm): No Change in features: No Change in ACR TI-RADS risk category: No ACR TI-RADS recommendations: *Given size (>/= 1.5 - 2.4 cm) and appearance, a follow-up ultrasound in 1 year should be considered based on TI-RADS criteria. _________________________________________________________ Nodule # 2: Prior biopsy: No Location: Right; Superior Maximum size: 1.8 cm; Other 2 dimensions: 1.6 x 1.7 cm, previously, 2.1 x 1.7 x 1.3 cm Composition: solid/almost completely solid (2) Echogenicity: isoechoic (1) Shape: not taller-than-wide (0) Margins: smooth (0) Echogenic foci: none (0) ACR TI-RADS total points: 3. ACR TI-RADS risk category:  TR3 (3 points). Significant change in size (>/= 20% in two dimensions and minimal increase of 2 mm): No Change in features: No Change in ACR TI-RADS risk category: No ACR TI-RADS recommendations: *Given size (>/= 1.5 - 2.4 cm) and appearance, a follow-up ultrasound in 1 year should be considered based on TI-RADS criteria. _________________________________________________________ Nodule # 4: Prior biopsy: No Location: Isthmus; Mid Maximum size: 1.4 cm; Other 2 dimensions: 1.1 x 1.1 cm, previously, 1.3 x 1.2 x 1.1 cm Composition: solid/almost completely solid (2) Echogenicity: isoechoic (1) Shape: not taller-than-wide (0) Margins: smooth (0) Echogenic foci: none  (0) ACR TI-RADS total points: 3. ACR TI-RADS risk category:  TR3 (3 points). Significant change in size (>/= 20% in two dimensions and minimal increase of 2 mm): No Change in features: No Change  in ACR TI-RADS risk category: No ACR TI-RADS recommendations: Given size (<1.4 cm) and appearance, this nodule does NOT meet TI-RADS criteria for biopsy or dedicated follow-up. _________________________________________________________ Nodule # 5: Prior biopsy: No Location: Isthmus; Mid Maximum size: 3.6 cm; Other 2 dimensions: 1.9 x 1.6 cm, previously, 2.0 x 1.7 x 1.4 cm Composition: solid/almost completely solid (2) Echogenicity: isoechoic (1) Shape: taller-than-wide (3) Margins: smooth (0) Echogenic foci: none (0) ACR TI-RADS total points: 6. ACR TI-RADS risk category:  TR4 (4-6 points). Significant change in size (>/= 20% in two dimensions and minimal increase of 2 mm): Yes Change in features: No Change in ACR TI-RADS risk category: No ACR TI-RADS recommendations: **Given size (>/= 1.5 cm) and appearance, fine needle aspiration of this moderately suspicious nodule should be considered based on TI-RADS criteria. _________________________________________________________ Nodule # 6: Prior biopsy: No Location: Left; Superior Maximum size: 2.3 cm; Other 2 dimensions: 1.9 x 2.0 cm, previously, 2.2 x 2.1 x 1.8 cm Composition: solid/almost completely solid (2) Echogenicity: isoechoic (1) Shape: not taller-than-wide (0) Margins: smooth (0) Echogenic foci: none (0) ACR TI-RADS total points: 3. ACR TI-RADS risk category:  TR3 (3 points). Significant change in size (>/= 20% in two dimensions and minimal increase of 2 mm): No Change in features: No Change in ACR TI-RADS risk category: No ACR TI-RADS recommendations: *Given size (>/= 1.5 - 2.4 cm) and appearance, a follow-up ultrasound in 1 year should be considered based on TI-RADS criteria. _________________________________________________________ Nodule # 7: Prior biopsy: No  Location: Left; Superior Maximum size: 2.6 cm; Other 2 dimensions: 2.4 x 1.4 cm, previously, 2.6 x 1.9 x 1.8 cm Composition: solid/almost completely solid (2) Echogenicity: isoechoic (1) Shape: not taller-than-wide (0) Margins: smooth (0) Echogenic foci: none (0) ACR TI-RADS total points: 3. ACR TI-RADS risk category:  TR3 (3 points). Significant change in size (>/= 20% in two dimensions and minimal increase of 2 mm): No Change in features: No Change in ACR TI-RADS risk category: No ACR TI-RADS recommendations: **Given size (>/= 2.5 cm) and appearance, fine needle aspiration of this mildly suspicious nodule should be considered based on TI-RADS criteria. _________________________________________________________ Nodule # 9: Location: Left; Inferior Maximum size: 1.6 cm; Other 2 dimensions: 1.3 x 1.3 cm Composition: solid/almost completely solid (2) Echogenicity: isoechoic (1) Shape: not taller-than-wide (0) Margins: smooth (0) Echogenic foci: none (0) ACR TI-RADS total points: 3. ACR TI-RADS risk category: TR3 (3 points). ACR TI-RADS recommendations: Given size (<1.4 cm) and appearance, this nodule does NOT meet TI-RADS criteria for biopsy or dedicated follow-up. _________________________________________________________ Right lower nodule 3 measures 4.7 x 3.0 x 3.4 cm and previously measured 3.5 x 2.6 x 4.0 cm. Biopsy was performed 03/14/2009. Left lower nodule 8 measures 7.5 x 3.9 x 6.1 cm and previously measured 4.2 x 5.5 x 3.7 cm. Biopsy was performed 03/14/2009. IMPRESSION: Nodule 1 is stable and meets criteria for annual follow-up Nodule 2 is stable and meets criteria for annual follow-up Nodule 3 previously underwent biopsy. Correlation with prior biopsy results is recommended. Nodule 4 does not meet criteria for biopsy nor follow-up Nodule 5 has significantly enlarged and meets criteria for fine needle aspiration biopsy. Nodule 6 is stable and meets criteria for annual follow-up. Nodule 7 is stable and  continues to meet criteria for fine needle aspiration biopsy. Nodule 8 previously underwent biopsy. Correlation with prior biopsy results is recommended. Nodule 9 does not meet criteria for biopsy nor follow-up. The above is in keeping with the ACR TI-RADS recommendations - J Am Coll Radiol  [id number]. Electronically Signed   By: Marybelle Killings M.D.   On: 08/11/2018 16:11     Assessment & Plan:  Plan  I have discontinued Mekesha C. Anes's promethazine-dextromethorphan, azithromycin, albuterol, and CINNAMON PO. I am also having her start on amoxicillin. Additionally, I am having her maintain her NovoLIN N ReliOn, multivitamin with minerals, Lancing Device, Tai Doc Control, aspirin EC, (Flaxseed, Linseed, (FLAXSEED OIL PO)), GINKGO BILOBA COMPLEX PO, fluticasone, Accu-Chek Aviva Plus, ferrous sulfate, Cyanocobalamin (VITAMIN B-12 PO), vitamin E, Carboxymethylcellulose Sodium (THERATEARS OP), insulin regular, ALPRAZolam, alendronate, atorvastatin, gabapentin, Insulin Syringe-Needle U-100, glucose blood, benzonatate, fluticasone, fluconazole, FLUoxetine, amLODipine, metoprolol succinate, and lisinopril.  Meds ordered this encounter  Medications   amoxicillin (AMOXIL) 875 MG tablet    Sig: Take 1 tablet (875 mg total) by mouth 2 (two) times daily.    Dispense:  20 tablet    Refill:  0    Problem List Items Addressed This Visit    None    Visit Diagnoses    Pansinusitis, unspecified chronicity    -  Primary   Relevant Medications   amoxicillin (AMOXIL) 875 MG tablet    con't with flonase and allegra  F/u / call if no better --- consider ent  Pt states ringing stopped this am  Follow-up: Return if symptoms worsen or fail to improve.  Ann Held, DO

## 2018-12-29 NOTE — Patient Instructions (Signed)

## 2019-01-28 ENCOUNTER — Other Ambulatory Visit: Payer: Self-pay | Admitting: Family Medicine

## 2019-01-29 ENCOUNTER — Ambulatory Visit (INDEPENDENT_AMBULATORY_CARE_PROVIDER_SITE_OTHER): Payer: Medicare HMO | Admitting: Family Medicine

## 2019-01-29 ENCOUNTER — Other Ambulatory Visit: Payer: Self-pay

## 2019-01-29 ENCOUNTER — Encounter: Payer: Self-pay | Admitting: Family Medicine

## 2019-01-29 ENCOUNTER — Ambulatory Visit: Payer: Medicare HMO | Admitting: Family Medicine

## 2019-01-29 DIAGNOSIS — R0982 Postnasal drip: Secondary | ICD-10-CM | POA: Diagnosis not present

## 2019-01-29 DIAGNOSIS — E1169 Type 2 diabetes mellitus with other specified complication: Secondary | ICD-10-CM

## 2019-01-29 DIAGNOSIS — E785 Hyperlipidemia, unspecified: Secondary | ICD-10-CM

## 2019-01-29 DIAGNOSIS — E1151 Type 2 diabetes mellitus with diabetic peripheral angiopathy without gangrene: Secondary | ICD-10-CM

## 2019-01-29 DIAGNOSIS — E1165 Type 2 diabetes mellitus with hyperglycemia: Secondary | ICD-10-CM

## 2019-01-29 MED ORDER — LEVOCETIRIZINE DIHYDROCHLORIDE 5 MG PO TABS: 5.0000 mg | ORAL_TABLET | Freq: Every evening | ORAL | 5 refills | Status: DC

## 2019-01-29 NOTE — Progress Notes (Signed)
Virtual Visit via Video Note  I connected with [name] on 01/29/19 at  2:15 PM EDT by a video enabled telemedicine application and verified that I am speaking with the correct person using two identifiers.   I discussed the limitations of evaluation and management by telemedicine and the availability of in person appointments. The patient expressed understanding and agreed to proceed.  History of Present Illness: Pt is home c/o sore throat and pnd.  She is only taking sudafed.  No fevers no sob, no chest pain  Pt was unable to due video due to tech difficulties and visit had to be transitioned into phone visit   HYPERTENSION   Blood pressure range---- good per pt  Chest pain- no      Dyspnea- no Lightheadedness- no   Edema- no  Other side effects - no   Medication compliance: good Low salt diet- yes    DIABETES    Blood Sugar ranges- 180  Yesterday   Polyuria- no New Visual problems- no  Hypoglycemic symptoms- no  Other side effects-no Medication compliance - good Last eye exam- due    HYPERLIPIDEMIA  Medication compliance- good RUQ pain- no  Muscle aches- no Other side effects-no   Review of Systems  Constitutional: Negative for activity change, appetite change and fatigue.  HENT: Negative for hearing loss, congestion, tinnitus and ear discharge.  dentist q53mEyes: Negative for visual disturbance (see optho q1y -- vision corrected to 20/20 with glasses).  Respiratory: Negative for cough, chest tightness and shortness of breath.   Cardiovascular: Negative for chest pain, palpitations and leg swelling.  Gastrointestinal: Negative for abdominal pain, diarrhea, constipation and abdominal distention.  Genitourinary: Negative for urgency, frequency, decreased urine volume and difficulty urinating.  Musculoskeletal: Negative for back pain, arthralgias and gait problem.  Skin: Negative for color change, pallor and rash.  Neurological: Negative for dizziness,  light-headedness, numbness and headaches.  Hematological: Negative for adenopathy. Does not bruise/bleed easily.  Psychiatric/Behavioral: Negative for suicidal ideas, confusion, sleep disturbance, self-injury, dysphoric mood, decreased concentration and agitation.   Past Medical History:  Diagnosis Date  . [place] coronary artery of heart [place]   . Depression   . Diabetes mellitus   . Hyperlipidemia   . Hypertension   . Pancreatitis   . Restless leg syndrome    Outpatient Encounter Medications as of 01/29/2019  Medication Sig  . alendronate (FOSAMAX) 70 MG tablet take 1 tablet by mouth every week TAKE WITH FULL GLASS OF WATER ON AN EMPTY STOMACH  . ALPRAZolam (XANAX) 0.25 MG tablet Take 1 tablet by mouth three times a day if needed  . amLODipine (NORVASC) 10 MG tablet TAKE 1 TABLET BY MOUTH EVERY DAY  . amoxicillin (AMOXIL) 875 MG tablet Take 1 tablet (875 mg total) by mouth 2 (two) times daily.  .Marland Kitchenaspirin EC 81 MG tablet Take 81 mg by mouth daily.  .Marland Kitchenatorvastatin (LIPITOR) 40 MG tablet TAKE 1 TABLET(40 MG) BY MOUTH DAILY  . benzonatate (TESSALON) 100 MG capsule Take 1 capsule (100 mg total) by mouth 3 (three) times daily as needed for cough.  . Blood Glucose Calibration (TAI DOC CONTROL) NORMAL SOLN   . Blood Glucose Monitoring Suppl (ACCU-CHEK AVIVA PLUS) w/Device KIT Check blood sugar daily. E11.9  . Carboxymethylcellulose Sodium (THERATEARS OP) Place 1 drop into both eyes daily as needed (dry eyes).  . Cyanocobalamin (VITAMIN B-12 PO) Take 1 tablet by mouth daily.  . ferrous sulfate 325 (65 FE) MG tablet Take  325 mg by mouth daily with breakfast.  . Flaxseed, Linseed, (FLAXSEED OIL PO) Take 1 capsule by mouth 2 (two) times daily.   . fluconazole (DIFLUCAN) 150 MG tablet 1 tab po x 1 day  . FLUoxetine (PROZAC) 20 MG capsule TAKE 1 CAPSULE BY MOUTH ONCE DAILY  . fluticasone (FLONASE) 50 MCG/ACT nasal spray Place 2 sprays into both nostrils daily. (Patient taking differently: Place  2 sprays into both nostrils daily as needed. )  . fluticasone (FLONASE) 50 MCG/ACT nasal spray Place 2 sprays into both nostrils daily.  [name] gabapentin (NEURONTIN) 300 MG capsule Take 1 capsule (300 mg total) by mouth 2 (two) times daily. If tolerating may take 3 times per day if needed.  [name] GINKGO BILOBA COMPLEX PO Take 1 tablet by mouth daily.   [name] glucose blood (ACCU-CHEK AVIVA) test strip Check blood sugar four times daily. E11.9  . insulin regular (NOVOLIN R RELION) 100 units/mL injection Inject 0.3 mLs (30 Units total) into the skin daily as needed for high blood sugar (at noon if CBG >200 and will be eating).  . Insulin Syringe-Needle U-100 (BD INSULIN SYRINGE U/F) 31G X 5/16" 0.3 ML MISC 10 Units by Does not apply route 3 (three) times daily before meals.  Elmore Guise Devices (LANCING DEVICE) MISC   . lisinopril (PRINIVIL,ZESTRIL) 10 MG tablet TAKE 1 TABLET BY MOUTH EVERY DAY  . metoprolol succinate (TOPROL-XL) 50 MG 24 hr tablet TAKE 1 TABLET BY MOUTH EVERY DAY WITH FOOD  . Multiple Vitamin (MULTIVITAMIN WITH MINERALS) TABS Take 1 tablet by mouth daily.  [name] NOVOLIN N RELION 100 UNIT/ML injection Inject 30-60 Units into the skin 2 (two) times daily. Inject 40 units subcutaneously before breakfast and 60 units at bedtime  . vitamin E 400 UNIT capsule Take 400 Units by mouth daily.  [name] levocetirizine (XYZAL) 5 MG tablet Take 1 tablet (5 mg total) by mouth every evening.   No facility-administered encounter medications on file as of 01/29/2019.          Observations/Objective: 155/72  p 63   Pt is in nad, no sob  Assessment and Plan:  1. Uncontrolled type 2 diabetes mellitus with hyperglycemia ([place]) hgba1c to be checked  minimize simple carbs. Increase exercise as tolerated. Continue current meds  - Hemoglobin A1c; Future - Lipid panel; Future - Comprehensive metabolic panel; Future - Microalbumin / creatinine urine ratio; Future  2. Hyperlipidemia associated with type 2 diabetes  mellitus ([place]) Tolerating statin, encouraged heart healthy diet, avoid trans fats, minimize simple carbs and saturated fats. Increase exercise as tolerated - Hemoglobin A1c; Future - Lipid panel; Future - Comprehensive metabolic panel; Future - Microalbumin / creatinine urine ratio; Future  3. Diabetes mellitus type 2 with peripheral artery disease ([place]) See above  4. PND (post-nasal drip) Use otc antihistamine and flonase Can refer to ent/ allergy if no better  - levocetirizine (XYZAL) 5 MG tablet; Take 1 tablet (5 mg total) by mouth every evening.  Dispense: 30 tablet; Refill: 5  Follow Up Instructions:    I discussed the assessment and treatment plan with the patient. The patient was provided an opportunity to ask questions and all were answered. The patient agreed with the plan and demonstrated an understanding of the instructions.   The patient was advised to call back or seek an in-person evaluation if the symptoms worsen or if the condition fails to improve as anticipated.  I provided  minutes of non-face-to-face time during this encounter.   Kendrick Fries  [place], DO

## 2019-01-29 NOTE — Progress Notes (Unsigned)
Virtual Visit via Video Note  I connected with [name] on 01/29/19 at 10:00 AM EDT by a video enabled telemedicine application and verified that I am speaking with the correct person using two identifiers.   I discussed the limitations of evaluation and management by telemedicine and the availability of in person appointments. The patient expressed understanding and agreed to proceed.  History of Present Illness:    Observations/Objective:   Assessment and Plan:   Follow Up Instructions:    I discussed the assessment and treatment plan with the patient. The patient was provided an opportunity to ask questions and all were answered. The patient agreed with the plan and demonstrated an understanding of the instructions.   The patient was advised to call back or seek an in-person evaluation if the symptoms worsen or if the condition fails to improve as anticipated.  I provided *** minutes of non-face-to-face time during this encounter.   Donato Schultz, DO

## 2019-03-05 ENCOUNTER — Other Ambulatory Visit: Payer: Self-pay | Admitting: Family Medicine

## 2019-03-09 DIAGNOSIS — R809 Proteinuria, unspecified: Secondary | ICD-10-CM | POA: Diagnosis not present

## 2019-03-09 DIAGNOSIS — E1121 Type 2 diabetes mellitus with diabetic nephropathy: Secondary | ICD-10-CM | POA: Diagnosis not present

## 2019-03-09 DIAGNOSIS — E7800 Pure hypercholesterolemia, unspecified: Secondary | ICD-10-CM | POA: Diagnosis not present

## 2019-03-09 DIAGNOSIS — E049 Nontoxic goiter, unspecified: Secondary | ICD-10-CM | POA: Diagnosis not present

## 2019-03-09 DIAGNOSIS — I10 Essential (primary) hypertension: Secondary | ICD-10-CM | POA: Diagnosis not present

## 2019-03-09 DIAGNOSIS — E1140 Type 2 diabetes mellitus with diabetic neuropathy, unspecified: Secondary | ICD-10-CM | POA: Diagnosis not present

## 2019-03-09 DIAGNOSIS — E1165 Type 2 diabetes mellitus with hyperglycemia: Secondary | ICD-10-CM | POA: Diagnosis not present

## 2019-03-13 ENCOUNTER — Other Ambulatory Visit: Payer: Self-pay | Admitting: Family Medicine

## 2019-04-01 ENCOUNTER — Other Ambulatory Visit: Payer: Self-pay | Admitting: Family Medicine

## 2019-04-01 MED ORDER — ALPRAZOLAM 0.25 MG PO TABS: ORAL_TABLET | ORAL | 0 refills | Status: DC

## 2019-04-01 NOTE — Telephone Encounter (Signed)
Patient needs a refill on her ALPRAZolam (XANAX) 0.25 MG tablet sent to her pharmacy [facility] [phone] - [place], [place].

## 2019-04-01 NOTE — Telephone Encounter (Signed)
Alprazolam refill.   Last OV: 01/29/2019 Last Fill: 08/23/2016 #60 and 0RF UDS: 09/10/2016 Low risk

## 2019-04-22 ENCOUNTER — Encounter (HOSPITAL_BASED_OUTPATIENT_CLINIC_OR_DEPARTMENT_OTHER): Payer: Self-pay | Admitting: *Deleted

## 2019-04-22 ENCOUNTER — Other Ambulatory Visit: Payer: Self-pay

## 2019-04-22 ENCOUNTER — Emergency Department (HOSPITAL_BASED_OUTPATIENT_CLINIC_OR_DEPARTMENT_OTHER)
Admission: EM | Admit: 2019-04-22 | Discharge: 2019-04-22 | Disposition: A | Discharge status: Home / Self Care | Payer: Medicare HMO | Attending: Emergency Medicine | Admitting: Emergency Medicine

## 2019-04-22 DIAGNOSIS — F17210 Nicotine dependence, cigarettes, uncomplicated: Secondary | ICD-10-CM | POA: Insufficient documentation

## 2019-04-22 DIAGNOSIS — H60502 Unspecified acute noninfective otitis externa, left ear: Secondary | ICD-10-CM | POA: Diagnosis not present

## 2019-04-22 DIAGNOSIS — I2510 Atherosclerotic heart disease of native coronary artery without angina pectoris: Secondary | ICD-10-CM | POA: Insufficient documentation

## 2019-04-22 DIAGNOSIS — Z794 Long term (current) use of insulin: Secondary | ICD-10-CM | POA: Diagnosis not present

## 2019-04-22 DIAGNOSIS — Z79899 Other long term (current) drug therapy: Secondary | ICD-10-CM | POA: Insufficient documentation

## 2019-04-22 DIAGNOSIS — E119 Type 2 diabetes mellitus without complications: Secondary | ICD-10-CM | POA: Insufficient documentation

## 2019-04-22 DIAGNOSIS — I10 Essential (primary) hypertension: Secondary | ICD-10-CM | POA: Insufficient documentation

## 2019-04-22 DIAGNOSIS — Z7982 Long term (current) use of aspirin: Secondary | ICD-10-CM | POA: Insufficient documentation

## 2019-04-22 DIAGNOSIS — H9202 Otalgia, left ear: Secondary | ICD-10-CM | POA: Diagnosis present

## 2019-04-22 MED ORDER — NEOMYCIN-POLYMYXIN-HC 3.5-10000-1 OT SUSP: 4.0000 [drp] | Freq: Three times a day (TID) | OTIC | 0 refills | Status: DC

## 2019-04-22 MED FILL — NEO/POLYMYXIN/HC EAR SUSP: 3.5-10000-1 | 17 days supply | Qty: 10 | Fill #0

## 2019-04-22 NOTE — ED Provider Notes (Signed)
[facility] EMERGENCY DEPARTMENT Provider Note   CSN: [id number] Arrival date & time: 04/22/19  1436    History   Chief Complaint Chief Complaint  Patient presents with  . Otalgia    HPI [name] is a 73 y.o. female.     HPI Patient presented to the emergency room for evaluation of left ear pain.  Patient statesit started a few days ago.  She has not noticed any drainage.  She denies any fevers or chills.  She is having pain in the left ear and also has noticed some areas of soreness around her ear.   Past Medical History:  Diagnosis Date  . Blockage of coronary artery of heart ([place])   . Depression   . Diabetes mellitus   . Hyperlipidemia   . Hypertension   . Pancreatitis   . Restless leg syndrome     Patient Active Problem List   Diagnosis Date Noted  . Elevated rheumatoid factor 07/21/2018  . Gastroenteritis 07/21/2018  . Trigger middle finger of left hand 12/19/2017  . Ganglion cyst of dorsum of right wrist 12/19/2017  . Morton's neuroma of right foot 12/19/2017  . Preventative health care 12/13/2017  . Pain in both hands 12/13/2017  . Hyperlipidemia LDL goal <100 12/13/2017  . Abscess of upper gum 12/13/2017  . Spinal stenosis of cervical region 09/25/2016  . Cervical radiculopathy 09/06/2016  . Dyspnea 02/18/2016  . Chest pain 02/18/2016  . HLD (hyperlipidemia) 02/18/2016  . CAD in native artery 02/18/2016  . RLS (restless legs syndrome) 02/18/2016  . Pancreatitis 02/18/2016  . SOB (shortness of breath) 02/18/2016  . Uncontrolled type 2 diabetes mellitus with complication ([place])   . Tobacco abuse   . Osteoarthritis of right knee 07/05/2015  . Smoking 07/02/2013  . Acute upper respiratory infections of unspecified site 07/02/2013  . Flu vaccine need 07/02/2013  . Multinodular goiter 04/16/2013  . Bunion 07/07/2012  . BACK PAIN, THORACIC REGION, RIGHT 05/24/2009  . IRON DEFICIENCY 03/25/2009  . GERD 03/25/2009  . WEIGHT LOSS  03/25/2009  . SINUSITIS- ACUTE-NOS 11/25/2008  . Hyperlipidemia LDL goal <70 10/07/2008  . ACUTE PANCREATITIS 09/07/2008  . OVARIAN CYST 09/07/2008  . KNEE PAIN, RIGHT 05/24/2008  . CARPAL TUNNEL SYNDROME, RIGHT 05/12/2008  . Acute pain of right shoulder 04/02/2008  . ROTATOR CUFF REPAIR, RIGHT, HX OF 03/29/2008  . GOITER, MULTINODULAR 12/26/2007  . Depression 07/07/2007  . HIP PAIN, RIGHT 03/11/2007  . RESTLESS LEG SYNDROME, HX OF 03/11/2007  . Diabetes mellitus type II, uncontrolled ([place]) 03/07/2007  . Essential hypertension 03/07/2007    Past Surgical History:  Procedure Laterality Date  . CATARACT EXTRACTION, BILATERAL    . CORONARY ANGIOPLASTY WITH STENT PLACEMENT    . FOOT SURGERY Right    cyst removal  . ROTATOR CUFF REPAIR     right  . ROTATOR CUFF REPAIR Left   . TUBAL LIGATION       OB History   No obstetric history on file.      Home Medications    Prior to Admission medications   Medication Sig Start Date End Date Taking? Authorizing Provider  alendronate (FOSAMAX) 70 MG tablet TAKE 1 TABLET BY MOUTH EVERY WEEK WITH FULL GLASS OF WATER ON AN EMPTY STOMACH 03/19/19   Carollee Herter, Alferd Apa, DO  ALPRAZolam Duanne Moron) 0.25 MG tablet Take 1 tablet by mouth three times a day if needed 04/01/19   Carollee Herter, Kendrick Fries R, DO  amLODipine (NORVASC) 10 MG  tablet TAKE 1 TABLET BY MOUTH EVERY DAY 10/06/18   Carollee Herter, Alferd Apa, DO  amoxicillin (AMOXIL) 875 MG tablet Take 1 tablet (875 mg total) by mouth 2 (two) times daily. 12/29/18   Ann Held, DO  aspirin EC 81 MG tablet Take 81 mg by mouth daily.    [provider]  atorvastatin (LIPITOR) 40 MG tablet TAKE 1 TABLET(40 MG) BY MOUTH DAILY 01/28/19   Carollee Herter, Alferd Apa, DO  benzonatate (TESSALON) 100 MG capsule Take 1 capsule (100 mg total) by mouth 3 (three) times daily as needed for cough. 08/11/18   Saguier, Percell Miller, PA-C  Blood Glucose Calibration (TAI [facility] CONTROL) NORMAL SOLN  10/19/12   [provider]  Blood Glucose Monitoring Suppl (ACCU-CHEK AVIVA PLUS) w/Device KIT Check blood sugar daily. E11.9 11/25/15   Ann Held, DO  Carboxymethylcellulose Sodium (THERATEARS OP) Place 1 drop into both eyes daily as needed (dry eyes).    [provider]  Cyanocobalamin (VITAMIN B-12 PO) Take 1 tablet by mouth daily.    [provider]  ferrous sulfate 325 (65 FE) MG tablet Take 325 mg by mouth daily with breakfast.    [provider]  Flaxseed, Linseed, (FLAXSEED OIL PO) Take 1 capsule by mouth 2 (two) times daily.     [provider]  fluconazole (DIFLUCAN) 150 MG tablet 1 tab po x 1 day 08/11/18   Saguier, Percell Miller, PA-C  FLUoxetine (PROZAC) 20 MG capsule TAKE 1 CAPSULE BY MOUTH EVERY DAY 03/05/19   Carollee Herter, Alferd Apa, DO  fluticasone (FLONASE) 50 MCG/ACT nasal spray Place 2 sprays into both nostrils daily. Patient taking differently: Place 2 sprays into both nostrils daily as needed.  11/14/15   Carollee Herter, Yvonne R, DO  fluticasone (FLONASE) 50 MCG/ACT nasal spray Place 2 sprays into both nostrils daily. 08/11/18   Saguier, Percell Miller, PA-C  gabapentin (NEURONTIN) 300 MG capsule Take 1 capsule (300 mg total) by mouth 2 (two) times daily. If tolerating may take 3 times per day if needed. 07/24/18   Carollee Herter, Yvonne R, DO  GINKGO BILOBA COMPLEX PO Take 1 tablet by mouth daily.     [provider]  glucose blood (ACCU-CHEK AVIVA) test strip Check blood sugar four times daily. E11.9 07/24/18   Ann Held, DO  insulin regular (NOVOLIN R RELION) 100 units/mL injection Inject 0.3 mLs (30 Units total) into the skin daily as needed for high blood sugar (at noon if CBG >200 and will be eating). 03/01/16   Ann Held, DO  Insulin Syringe-Needle U-100 (BD INSULIN SYRINGE U/F) 31G X 5/16" 0.3 ML MISC 10 Units by Does not apply route 3 (three) times daily before meals. 07/24/18   Ann Held, DO  Lancet Devices (LANCING  DEVICE) [place]  10/19/12   [provider]  levocetirizine (XYZAL) 5 MG tablet Take 1 tablet (5 mg total) by mouth every evening. 01/29/19   Carollee Herter, Alferd Apa, DO  lisinopril (PRINIVIL,ZESTRIL) 10 MG tablet TAKE 1 TABLET BY MOUTH EVERY DAY 10/21/18   Roma Schanz R, DO  metoprolol succinate (TOPROL-XL) 50 MG 24 hr tablet TAKE 1 TABLET BY MOUTH EVERY DAY WITH FOOD 10/21/18   Ann Held, DO  Multiple Vitamin (MULTIVITAMIN WITH MINERALS) TABS Take 1 tablet by mouth daily.    [provider]  neomycin-polymyxin-hydrocortisone (CORTISPORIN) 3.5-10000-1 OTIC suspension Place 4 drops into the left ear 3 (three) times daily.  For 1 week 04/22/19   Dorie Rank, MD  NOVOLIN N RELION 100 UNIT/ML injection Inject 30-60 Units into the skin 2 (two) times daily. Inject 40 units subcutaneously before breakfast and 60 units at bedtime 05/05/12   [provider]  vitamin E 400 UNIT capsule Take 400 Units by mouth daily.    [provider]    Family History Family History  Problem Relation Age of Onset  . Heart attack Father   . Heart attack Brother   . Diabetes Sister   . Cirrhosis Sister   . Hypertension Sister   . Cancer Mother   . Hypertension Mother   . Vision loss Mother   . Cancer Sister   . Sleep apnea Sister   . Heart Problems Sister   . Diabetes Sister   . Gout Sister   . Diabetes Son   . Heart attack Son   . Diabetes Daughter   . Goiter Daughter   . Diabetes Daughter     Social History Social History   Tobacco Use  . Smoking status: Current Some Day Smoker    Packs/day: 0.20    Types: Cigarettes  . Smokeless tobacco: Never Used  Substance Use Topics  . Alcohol use: No  . Drug use: No     Allergies   Levemir [insulin detemir] and Metformin and related   Review of Systems Review of Systems  All other systems reviewed and are negative.    Physical Exam Updated Vital Signs BP (!) 143/57 (BP Location: Right Arm)   Pulse  (!) 57   Temp 97.8 F (36.6 C) (Oral)   Resp 18   Ht 1.651 m ('5\' 5"' )   Wt 81.4 kg   SpO2 100%   BMI 29.85 kg/m   Physical Exam Vitals signs and nursing note reviewed.  Constitutional:      General: She is not in acute distress.    Appearance: She is well-developed.  HENT:     Head: Normocephalic and atraumatic.     Right Ear: Tympanic membrane and external ear normal.     Left Ear: Tympanic membrane normal. Tenderness present. Tympanic membrane is not injected or erythematous.     Ears:     Comments: Erythema of the left external auditory canal, tenderness with the exam Eyes:     General: No scleral icterus.       Right eye: No discharge.        Left eye: No discharge.     Conjunctiva/sclera: Conjunctivae normal.  Neck:     Musculoskeletal: Normal range of motion and neck supple.     Trachea: No tracheal deviation.  Cardiovascular:     Rate and Rhythm: Normal rate.  Pulmonary:     Effort: Pulmonary effort is normal. No respiratory distress.     Breath sounds: No stridor.  Abdominal:     General: There is no distension.  Musculoskeletal:        General: No swelling or deformity.  Skin:    General: Skin is warm and dry.     Findings: No rash.  Neurological:     Mental Status: She is alert.     Cranial Nerves: Cranial nerve deficit: no gross deficits.      ED Treatments / Results   Procedures Procedures (including critical care time)  Medications Ordered in ED Medications - No data to display   Initial Impression / Assessment and Plan / ED Course  I have reviewed the triage vital signs  and the nursing notes.  Pertinent labs & imaging results that were available during my care of the patient were reviewed by me and considered in my medical decision making (see chart for details).   Symptoms are consistent with otitis externa.  Will discharge home with a course antibiotic eardrops.  Final Clinical Impressions(s) / ED Diagnoses   Final diagnoses:  Acute  otitis externa of left ear, unspecified type    ED Discharge Orders         Ordered    neomycin-polymyxin-hydrocortisone (CORTISPORIN) 3.5-10000-1 OTIC suspension  3 times daily     04/22/19 1549           Dorie Rank, MD 04/22/19 1549

## 2019-04-22 NOTE — ED Triage Notes (Signed)
c/o left earpain x 4 days

## 2019-04-26 ENCOUNTER — Other Ambulatory Visit: Payer: Self-pay | Admitting: Family Medicine

## 2019-04-28 ENCOUNTER — Other Ambulatory Visit: Payer: Self-pay | Admitting: Family Medicine

## 2019-05-08 NOTE — Progress Notes (Signed)
Virtual Visit via Video Note  I connected with patient on 05/11/19 at  1:00 PM EDT by audio enabled telemedicine application and verified that I am speaking with the correct person using two identifiers.   THIS ENCOUNTER IS A VIRTUAL VISIT DUE TO COVID-19 - PATIENT WAS NOT SEEN IN THE OFFICE. PATIENT HAS CONSENTED TO VIRTUAL VISIT / TELEMEDICINE VISIT   Location of patient: home  Location of provider: office  I discussed the limitations of evaluation and management by telemedicine and the availability of in person appointments. The patient expressed understanding and agreed to proceed.   Subjective:   [name] is a 73 y.o. female who presents for Medicare Annual (Subsequent) preventive examination.  Review of Systems: No ROS.  Medicare Wellness Virtual Visit.  Visual/audio telehealth visit, UTA vital signs.   See social history for additional risk factors. Cardiac Risk Factors include: advanced age (>23mn, >>40women);diabetes mellitus;dyslipidemia;hypertension;sedentary lifestyle;smoking/ tobacco exposure Sleep patterns: Home Safety/Smoke Alarms: Feels safe in home. Smoke alarms in place.  Lives with husband and 2 grandchildren. 1 story home.   Female:     Mammo- declines      Dexa scan-  declines  CCS- declines    Objective:      Advanced Directives 05/11/2019 04/22/2019 10/14/2017 04/01/2017 08/01/2016 09/29/2015 12/31/2014  Does Patient Have a Medical Advance Directive? _0  No No  Would patient like information on creating a medical advance directive? No - Patient declined - - Yes (MAU/Ambulatory/Procedural Areas - Information given) No - patient declined information Yes - Educational materials given No - patient declined information    Tobacco Social History   Tobacco Use  Smoking Status Current Some Day Smoker  . Packs/day: 0.20  . Types: Cigarettes  Smokeless Tobacco Never Used  Tobacco Comment   pt states she is not ready     Ready to quit: No  Counseling given: No Comment: pt states she is not ready   Clinical Intake:     Pain : No/denies pain                 Past Medical History:  Diagnosis Date  . [place]   . Depression   . Diabetes mellitus   . Hyperlipidemia   . Hypertension   . Pancreatitis   . Restless leg syndrome    Past Surgical History:  Procedure Laterality Date  . CATARACT EXTRACTION, BILATERAL    . CORONARY ANGIOPLASTY WITH STENT PLACEMENT    . FOOT SURGERY Right    cyst removal  . ROTATOR CUFF REPAIR     right  . ROTATOR CUFF REPAIR Left   . TUBAL LIGATION     Family History  Problem Relation Age of Onset  . Heart attack Father   . Heart attack Brother   . Diabetes Sister   . Cirrhosis Sister   . Hypertension Sister   . Cancer Mother   . Hypertension Mother   . Vision loss Mother   . Cancer Sister   . Sleep apnea Sister   . Heart Problems Sister   . Diabetes Sister   . Gout Sister   . Diabetes Son   . Heart attack Son   . Diabetes Daughter   . Goiter Daughter   . Diabetes Daughter    Social History   Socioeconomic History  . Marital status: Married    Spouse name: Not on file  . Number of children: Not on file  .  Years of education: Not on file  . Highest education level: Not on file  Occupational History  . Not on file  Social Needs  . Financial resource strain: Not on file  . Food insecurity    Worry: Not on file    Inability: Not on file  . Transportation needs    Medical: Not on file    Non-medical: Not on file  Tobacco Use  . Smoking status: Current Some Day Smoker    Packs/day: 0.20    Types: Cigarettes  . Smokeless tobacco: Never Used  . Tobacco comment: pt states she is not ready  Substance and Sexual Activity  . Alcohol use: No  . Drug use: No  . Sexual activity: Not on file  Lifestyle  . Physical activity    Days per week: Not on file    Minutes per session: Not on file  . Stress: Not on file   Relationships  . Social Herbalist on phone: Not on file    Gets together: Not on file    Attends religious service: Not on file    Active member of club or organization: Not on file    Attends meetings of clubs or organizations: Not on file    Relationship status: Not on file  Other Topics Concern  . Not on file  Social History Narrative   Exercise: seldom   Caffeine use: seldom    Outpatient Encounter Medications as of 05/11/2019  Medication Sig  . alendronate (FOSAMAX) 70 MG tablet TAKE 1 TABLET BY MOUTH EVERY WEEK WITH FULL GLASS OF WATER ON AN EMPTY STOMACH  . ALPRAZolam (XANAX) 0.25 MG tablet Take 1 tablet by mouth three times a day if needed  . amLODipine (NORVASC) 10 MG tablet TAKE 1 TABLET BY MOUTH EVERY DAY  . aspirin EC 81 MG tablet Take 81 mg by mouth daily.  [name] atorvastatin (LIPITOR) 40 MG tablet TAKE 1 TABLET(40 MG) BY MOUTH DAILY  . Blood Glucose Calibration (TAI [place] CONTROL) NORMAL SOLN   . Blood Glucose Monitoring Suppl (ACCU-CHEK AVIVA PLUS) w/Device KIT Check blood sugar daily. E11.9  . Carboxymethylcellulose Sodium (THERATEARS OP) Place 1 drop into both eyes daily as needed (dry eyes).  . Cyanocobalamin (VITAMIN B-12 PO) Take 1 tablet by mouth daily.  . ferrous sulfate 325 (65 FE) MG tablet Take 325 mg by mouth daily with breakfast.  . Flaxseed, Linseed, (FLAXSEED OIL PO) Take 1 capsule by mouth 2 (two) times daily.   [name] FLUoxetine (PROZAC) 20 MG capsule TAKE 1 CAPSULE BY MOUTH EVERY DAY  . fluticasone (FLONASE) 50 MCG/ACT nasal spray Place 2 sprays into both nostrils daily. (Patient taking differently: Place 2 sprays into both nostrils daily as needed. )  . fluticasone (FLONASE) 50 MCG/ACT nasal spray Place 2 sprays into both nostrils daily.  [name] gabapentin (NEURONTIN) 300 MG capsule Take 1 capsule (300 mg total) by mouth 2 (two) times daily. If tolerating may take 3 times per day if needed.  [name] GINKGO BILOBA COMPLEX PO Take 1 tablet by mouth daily.   [name]  glucose blood (ACCU-CHEK AVIVA) test strip Check blood sugar four times daily. E11.9  . insulin regular (NOVOLIN R RELION) 100 units/mL injection Inject 0.3 mLs (30 Units total) into the skin daily as needed for high blood sugar (at noon if CBG >200 and will be eating).  . Insulin Syringe-Needle U-100 (BD INSULIN SYRINGE U/F) 31G X 5/16" 0.3 ML MISC 10 Units by Does not apply  route 3 (three) times daily before meals.  Elmore Guise Devices (LANCING DEVICE) MISC   . levocetirizine (XYZAL) 5 MG tablet Take 1 tablet (5 mg total) by mouth every evening.  [name] lisinopril (ZESTRIL) 10 MG tablet TAKE 1 TABLET BY MOUTH EVERY DAY  . metoprolol succinate (TOPROL-XL) 50 MG 24 hr tablet TAKE 1 TABLET BY MOUTH EVERY DAY WITH FOOD  . Multiple Vitamin (MULTIVITAMIN WITH MINERALS) TABS Take 1 tablet by mouth daily.  [name] neomycin-polymyxin-hydrocortisone (CORTISPORIN) 3.5-10000-1 OTIC suspension Place 4 drops into the left ear 3 (three) times daily. For 1 week  . NOVOLIN N RELION 100 UNIT/ML injection Inject 30-60 Units into the skin 2 (two) times daily. Inject 40 units subcutaneously before breakfast and 60 units at bedtime  . vitamin E 400 UNIT capsule Take 400 Units by mouth daily.  . benzonatate (TESSALON) 100 MG capsule Take 1 capsule (100 mg total) by mouth 3 (three) times daily as needed for cough. (Patient not taking: Reported on 05/11/2019)  . [DISCONTINUED] amoxicillin (AMOXIL) 875 MG tablet Take 1 tablet (875 mg total) by mouth 2 (two) times daily.  . [DISCONTINUED] fluconazole (DIFLUCAN) 150 MG tablet 1 tab po x 1 day   No facility-administered encounter medications on file as of 05/11/2019.     Activities of Daily Living In your present state of health, do you have any difficulty performing the following activities: 05/11/2019  Hearing? N  Vision? N  Difficulty concentrating or making decisions? N  Walking or climbing stairs? N  Dressing or bathing? N  Doing errands, shopping? N  Preparing Food and eating ? N   Using the Toilet? N  In the past six months, have you accidently leaked urine? N  Do you have problems with loss of bowel control? N  Managing your Medications? N  Managing your Finances? N  Housekeeping or managing your Housekeeping? N  Some recent data might be hidden    Patient Care Team: Carollee Herter, Alferd Apa, DO as PCP - General Jacelyn Pi, MD as Consulting Physician (Endocrinology) Ashok Pall, MD as Consulting Physician (Neurosurgery) Monna Fam, MD as Consulting Physician (Ophthalmology)    Assessment:   This is a routine wellness examination for [place]. Physical assessment deferred to PCP.  Exercise Activities and Dietary recommendations Current Exercise Habits: The patient does not participate in regular exercise at present, Exercise limited by: None identified Diet (meal preparation, eat out, water intake, caffeinated beverages, dairy products, fruits and vegetables): 24 hr recall Late Breakfast: sausage and eggs Lunch: skiped Dinner:  Fried fish, fries, slaw. Diet soda Drinks at least 4 glasses of water per day.  Goals    . Increase physical activity     Start Silver Sneakers.    . Quit smoking / using tobacco       Fall Risk Fall Risk  05/11/2019 04/01/2017 10/11/2016 11/14/2015 11/29/2014  Falls in the past year? 0 No No No No   Depression Screen PHQ 2/9 Scores 05/11/2019 04/01/2017 10/11/2016 11/14/2015  PHQ - 2 Score 0 0 0 0      MMSE - Mini Mental State Exam 04/01/2017  Orientation to time 5  Orientation to Place 5  Registration 3  Attention/ Calculation 5  Recall 2  Language- name 2 objects 2  Language- repeat 1  Language- follow 3 step command 3  Language- read & follow direction 1  Write a sentence 1  Copy design 1  Total score 29     6CIT Screen 05/11/2019  What Year?  0 points  What month? 0 points  What time? 0 points  Count back from 20 0 points  Months in reverse 0 points  Repeat phrase 0 points  Total Score 0    Immunization  History  Administered Date(s) Administered  . Influenza Split 08/08/2012  . Influenza, High Dose Seasonal PF 08/09/2014, 07/05/2015, 07/21/2018  . PPD Test 08/27/2011, 09/04/2011, 07/13/2014, 09/23/2015  . Pneumococcal Conjugate-13 08/09/2014  . Pneumococcal Polysaccharide-23 11/17/2013  . Td 03/11/2007  . Zoster 03/11/2007   Screening Tests Health Maintenance  Topic Date Due  . OPHTHALMOLOGY EXAM  11/24/2015  . FOOT EXAM  04/01/2018  . HEMOGLOBIN A1C  01/17/2019  . INFLUENZA VACCINE  05/09/2019  . TETANUS/TDAP  12/14/2023 (Originally 03/10/2017)  . MAMMOGRAM  05/30/2019  . Fecal DNA (Cologuard)  04/15/2020  . DEXA SCAN  Completed  . Hepatitis C Screening  Completed  . PNA vac Low Risk Adult  Completed       Plan:   See you next year!  Eat heart healthy diet (full of fruits, vegetables, whole grains, lean protein, water--limit salt, fat, and sugar intake) and increase physical activity as tolerated.  Continue doing brain stimulating activities (puzzles, reading, adult coloring books, staying active) to keep memory sharp.   I have personally reviewed and noted the following in the patient's chart:   . Medical and social history . Use of alcohol, tobacco or illicit drugs  . Current medications and supplements . Functional ability and status . Nutritional status . Physical activity . Advanced directives . List of other physicians . Hospitalizations, surgeries, and ER visits in previous 12 months . Vitals . Screenings to include cognitive, depression, and falls . Referrals and appointments  In addition, I have reviewed and discussed with patient certain preventive protocols, quality metrics, and best practice recommendations. A written personalized care plan for preventive services as well as general preventive health recommendations were provided to patient.     Shela Nevin, [place]  05/11/2019

## 2019-05-11 ENCOUNTER — Other Ambulatory Visit: Payer: Self-pay

## 2019-05-11 ENCOUNTER — Encounter: Payer: Self-pay | Admitting: *Deleted

## 2019-05-11 ENCOUNTER — Ambulatory Visit (INDEPENDENT_AMBULATORY_CARE_PROVIDER_SITE_OTHER): Payer: Medicare HMO | Admitting: *Deleted

## 2019-05-11 DIAGNOSIS — Z0000 Encounter for general adult medical examination without abnormal findings: Secondary | ICD-10-CM | POA: Diagnosis not present

## 2019-05-11 NOTE — Patient Instructions (Addendum)
See you next year!  Eat heart healthy diet (full of fruits, vegetables, whole grains, lean protein, water--limit salt, fat, and sugar intake) and increase physical activity as tolerated.  Continue doing brain stimulating activities (puzzles, reading, adult coloring books, staying active) to keep memory sharp.    [name] , Thank you for taking time to come for your Medicare Wellness Visit. I appreciate your ongoing commitment to your health goals. Please review the following plan we discussed and let me know if I can assist you in the future.   These are the goals we discussed: Goals    . Increase physical activity     Start Silver Sneakers.    . Quit smoking / using tobacco       This is a list of the screening recommended for you and due dates:  Health Maintenance  Topic Date Due  . Eye exam for diabetics  11/24/2015  . Complete foot exam   04/01/2018  . Hemoglobin A1C  01/17/2019  . Flu Shot  05/09/2019  . Tetanus Vaccine  12/14/2023*  . Mammogram  05/30/2019  . Cologuard (Stool DNA test)  04/15/2020  . DEXA scan (bone density measurement)  Completed  .  Hepatitis C: One time screening is recommended by Center for Disease Control  (CDC) for  adults born from [birth year] through 1965.   Completed  . Pneumonia vaccines  Completed  *Topic was postponed. The date shown is not the original due date.    Health Maintenance After Age 49 After age 71, you are at a higher risk for certain long-term diseases and infections as well as injuries from falls. Falls are a major cause of broken bones and head injuries in people who are older than age 14. Getting regular preventive care can help to keep you healthy and well. Preventive care includes getting regular testing and making lifestyle changes as recommended by your health care provider. Talk with your health care provider about:  Which screenings and tests you should have. A screening is a test that checks for a disease when you have no  symptoms.  A diet and exercise plan that is right for you. What should I know about screenings and tests to prevent falls? Screening and testing are the best ways to find a health problem early. Early diagnosis and treatment give you the best chance of managing medical conditions that are common after age 62. Certain conditions and lifestyle choices may make you more likely to have a fall. Your health care provider may recommend:  Regular vision checks. Poor vision and conditions such as cataracts can make you more likely to have a fall. If you wear glasses, make sure to get your prescription updated if your vision changes.  Medicine review. Work with your health care provider to regularly review all of the medicines you are taking, including over-the-counter medicines. Ask your health care provider about any side effects that may make you more likely to have a fall. Tell your health care provider if any medicines that you take make you feel dizzy or sleepy.  Osteoporosis screening. Osteoporosis is a condition that causes the bones to get weaker. This can make the bones weak and cause them to break more easily.  Blood pressure screening. Blood pressure changes and medicines to control blood pressure can make you feel dizzy.  Strength and balance checks. Your health care provider may recommend certain tests to check your strength and balance while standing, walking, or changing positions.  Foot  health exam. Foot pain and numbness, as well as not wearing proper footwear, can make you more likely to have a fall.  Depression screening. You may be more likely to have a fall if you have a fear of falling, feel emotionally low, or feel unable to do activities that you used to do.  Alcohol use screening. Using too much alcohol can affect your balance and may make you more likely to have a fall. What actions can I take to lower my risk of falls? General instructions  Talk with your health care  provider about your risks for falling. Tell your health care provider if: ? You fall. Be sure to tell your health care provider about all falls, even ones that seem minor. ? You feel dizzy, sleepy, or off-balance.  Take over-the-counter and prescription medicines only as told by your health care provider. These include any supplements.  Eat a healthy diet and maintain a healthy weight. A healthy diet includes low-fat dairy products, low-fat (lean) meats, and fiber from whole grains, beans, and lots of fruits and vegetables. Home safety  Remove any tripping hazards, such as rugs, cords, and clutter.  Install safety equipment such as grab bars in bathrooms and safety rails on stairs.  Keep rooms and walkways well-lit. Activity   Follow a regular exercise program to stay fit. This will help you maintain your balance. Ask your health care provider what types of exercise are appropriate for you.  If you need a cane or walker, use it as recommended by your health care provider.  Wear supportive shoes that have nonskid soles. Lifestyle  Do not drink alcohol if your health care provider tells you not to drink.  If you drink alcohol, limit how much you have: ? 0-1 drink a day for women. ? 0-2 drinks a day for men.  Be aware of how much alcohol is in your drink. In the [place] one drink equals one typical bottle of beer (12 oz), one-half glass of wine (5 oz), or one shot of hard liquor (1 oz).  Do not use any products that contain nicotine or tobacco, such as cigarettes and e-cigarettes. If you need help quitting, ask your health care provider. Summary  Having a healthy lifestyle and getting preventive care can help to protect your health and wellness after age 11.  Screening and testing are the best way to find a health problem early and help you avoid having a fall. Early diagnosis and treatment give you the best chance for managing medical conditions that are more common for people who  are older than age 23.  Falls are a major cause of broken bones and head injuries in people who are older than age 12. Take precautions to prevent a fall at home.  Work with your health care provider to learn what changes you can make to improve your health and wellness and to prevent falls. This information is not intended to replace advice given to you by your health care provider. Make sure you discuss any questions you have with your health care provider. Document Released: 08/07/2017 Document Revised: 01/15/2019 Document Reviewed: 08/07/2017 Elsevier Patient Education  2020 [employer].

## 2019-05-12 ENCOUNTER — Other Ambulatory Visit: Payer: Self-pay | Admitting: *Deleted

## 2019-05-12 MED ORDER — METOPROLOL SUCCINATE ER 50 MG PO TB24: 50.0000 mg | ORAL_TABLET | Freq: Every day | ORAL | 1 refills | Status: DC

## 2019-07-02 IMAGING — US US THYROID
1 series · 14 of 25 positions shown · non-contrast
Comparison: 01/12/2015

CLINICAL DATA: Palpable abnormality. Anterior neck and thyroid
enlargement

EXAM:
THYROID ULTRASOUND
TECHNIQUE: Ultrasound examination of the thyroid gland and adjacent soft
tissues was performed.

[Series 1: us thyroid · 0.09mm/px · 14 of 117 slices shown]
[im 1/117]
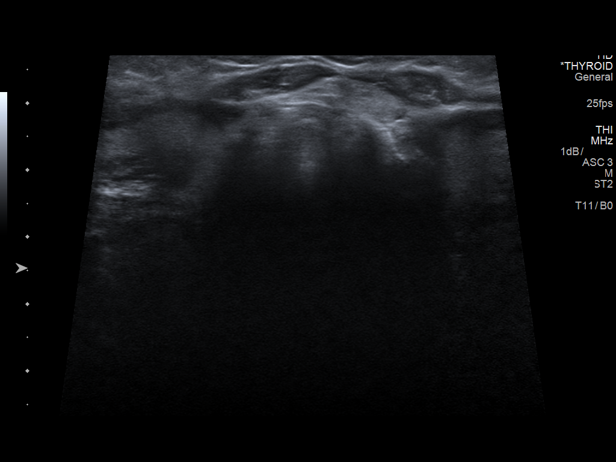
[im 10/117]
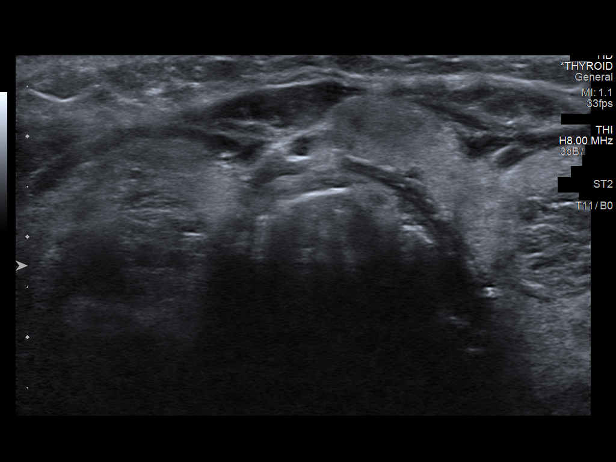
[im 20/117]
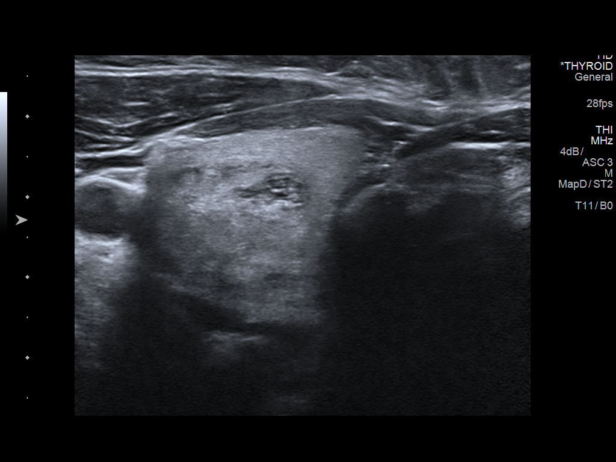
[im 30/117]
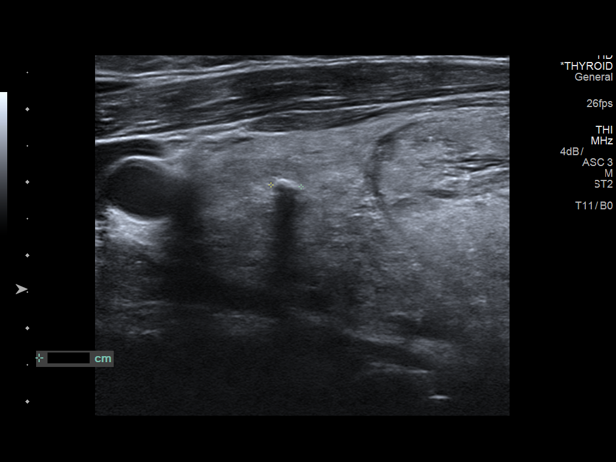
[im 39/117]
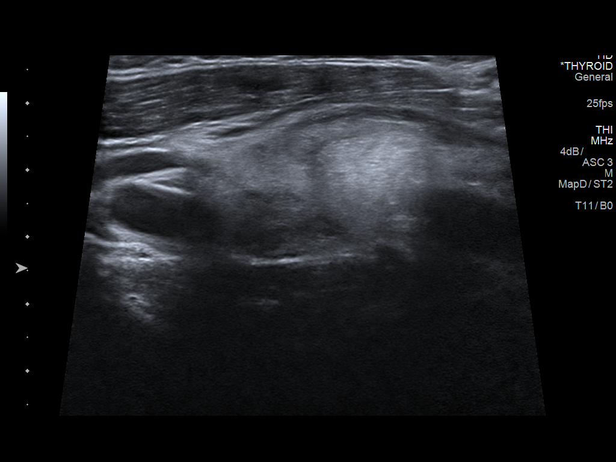
[im 44/117]
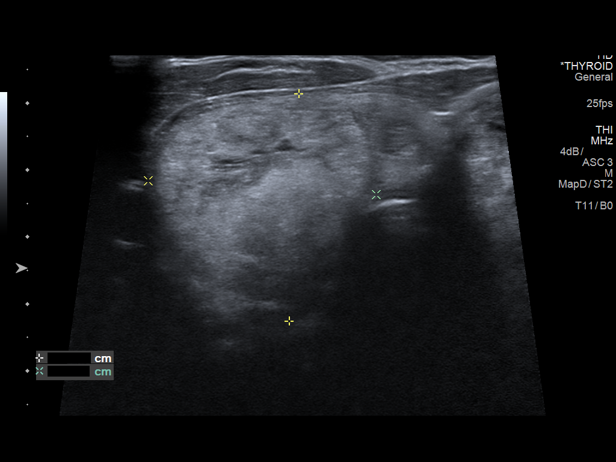
[im 54/117]
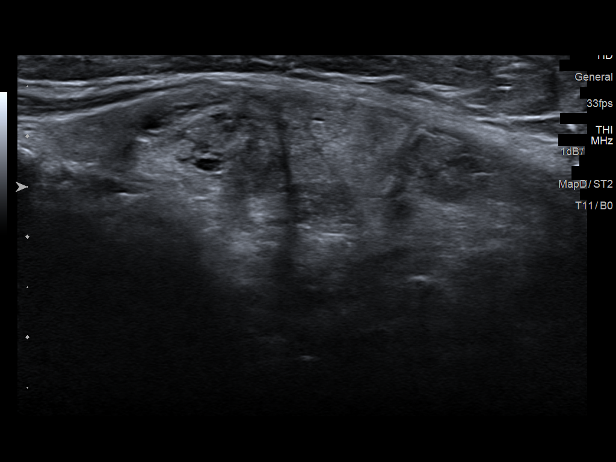
[im 63/117]
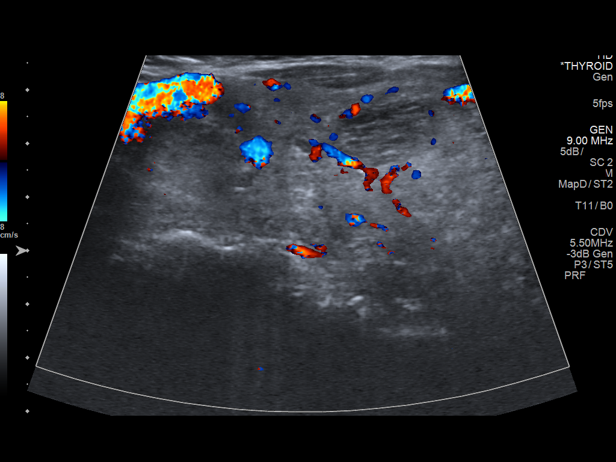
[im 73/117]
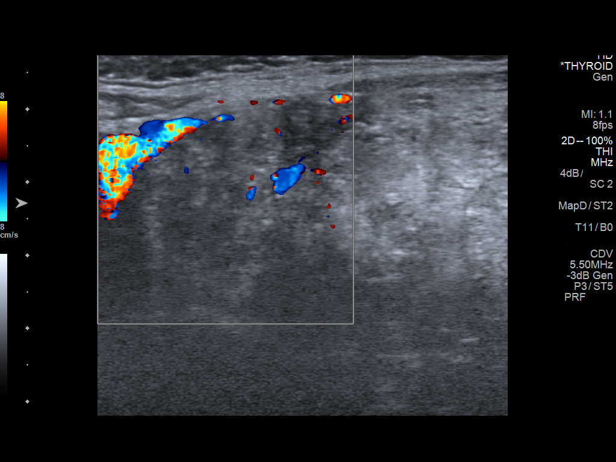
[im 78/117]
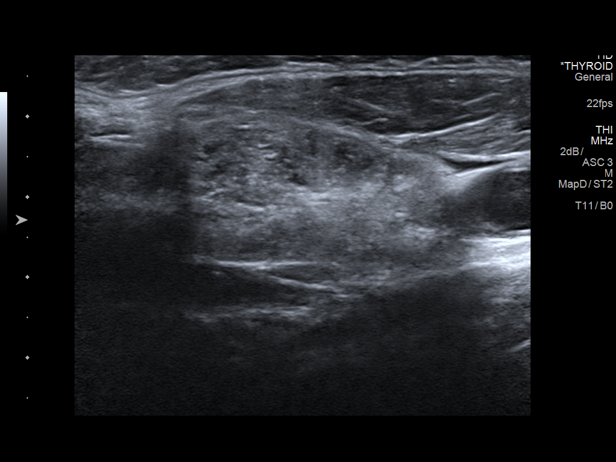
[im 88/117]
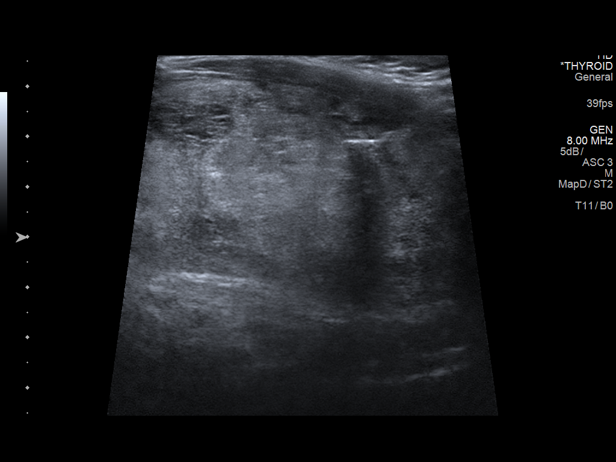
[im 97/117]
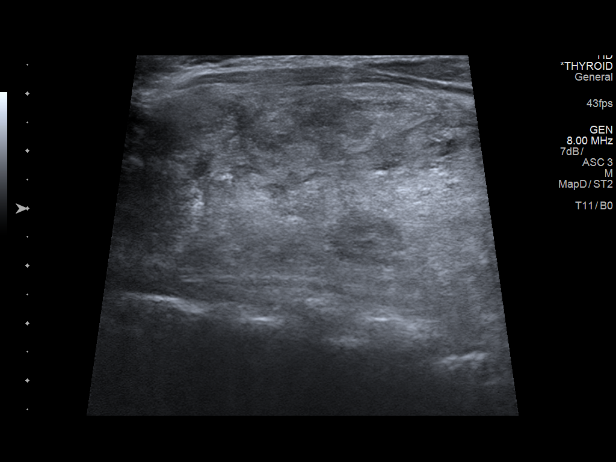
[im 107/117]
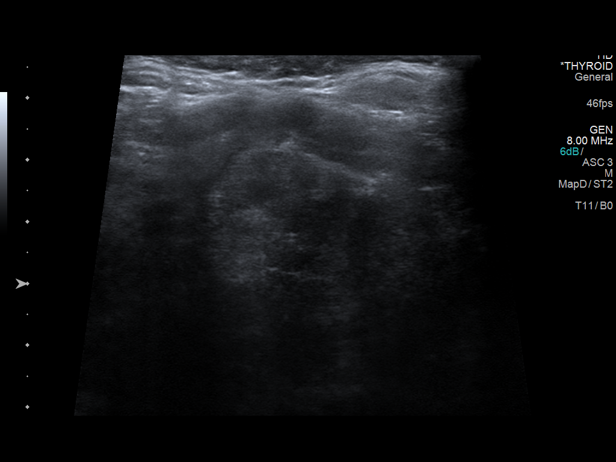
[im 117/117]
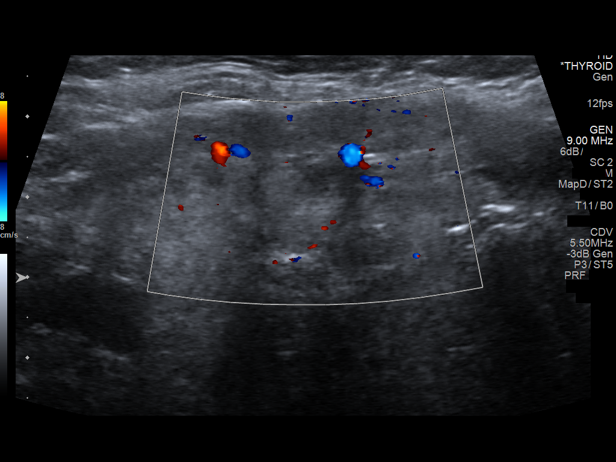

[14 of 25 positions shown; findings below may reference images not displayed]

FINDINGS: Parenchymal Echotexture: Markedly heterogenous

Isthmus: 1.2 cm, previously 1.1 cm

Right lobe: 7.3 x 3.3 x 3.4 cm, previously 7.5 x 3.6 x 3.6 cm

Left lobe: 10.4 x 4.7 x 5.0 cm, previously 9.0 x 3.8 x 4.7 cm

_________________________________________________________

Estimated total number of nodules >/= 1 cm: 6-10

Number of spongiform nodules >/=  2 cm not described below (TR1): 0

Number of mixed cystic and solid nodules >/= 1.5 cm not described
below (TR2): 0

_________________________________________________________

Nodule # 1:

Prior biopsy: No

Location: Right; Superior

Maximum size: 2.3 cm; Other 2 dimensions: 2.2 x 2.0 cm, previously,
2.3 x 1.8 x 2.4 cm

Composition: solid/almost completely solid (2)

Echogenicity: isoechoic (1)

Shape: not taller-than-wide (0)

Margins: smooth (0)

Echogenic foci: none (0)

ACR TI-RADS total points: 3.

ACR TI-RADS risk category:  TR3 (3 points).

Significant change in size (>/= 20% in two dimensions and minimal
increase of 2 mm): No

Change in features: No

Change in ACR TI-RADS risk category: No

ACR TI-RADS recommendations:

*Given size (>/= 1.5 - 2.4 cm) and appearance, a follow-up
ultrasound in 1 year should be considered based on TI-RADS criteria.

_________________________________________________________

Nodule # 2:

Prior biopsy: No

Location: Right; Superior

Maximum size: 1.8 cm; Other 2 dimensions: 1.6 x 1.7 cm, previously,
2.1 x 1.7 x 1.3 cm

Composition: solid/almost completely solid (2)

Echogenicity: isoechoic (1)

Shape: not taller-than-wide (0)

Margins: smooth (0)

Echogenic foci: none (0)

ACR TI-RADS total points: 3.

ACR TI-RADS risk category:  TR3 (3 points).

Significant change in size (>/= 20% in two dimensions and minimal
increase of 2 mm): No

Change in features: No

Change in ACR TI-RADS risk category: No

ACR TI-RADS recommendations:

*Given size (>/= 1.5 - 2.4 cm) and appearance, a follow-up
ultrasound in 1 year should be considered based on TI-RADS criteria.

_________________________________________________________

Nodule # 4:

Prior biopsy: No

Location: Isthmus; Mid

Maximum size: 1.4 cm; Other 2 dimensions: 1.1 x 1.1 cm, previously,
1.3 x 1.2 x 1.1 cm

Composition: solid/almost completely solid (2)

Echogenicity: isoechoic (1)

Shape: not taller-than-wide (0)

Margins: smooth (0)

Echogenic foci: none (0)

ACR TI-RADS total points: 3.

ACR TI-RADS risk category:  TR3 (3 points).

Significant change in size (>/= 20% in two dimensions and minimal
increase of 2 mm): No

Change in features: No

Change in ACR TI-RADS risk category: No

ACR TI-RADS recommendations:

Given size (<1.4 cm) and appearance, this nodule does NOT meet
TI-RADS criteria for biopsy or dedicated follow-up.

_________________________________________________________

Nodule # 5:

Prior biopsy: No

Location: Isthmus; Mid

Maximum size: 3.6 cm; Other 2 dimensions: 1.9 x 1.6 cm, previously,
2.0 x 1.7 x 1.4 cm

Composition: solid/almost completely solid (2)

Echogenicity: isoechoic (1)

Shape: taller-than-wide (3)

Margins: smooth (0)

Echogenic foci: none (0)

ACR TI-RADS total points: 6.

ACR TI-RADS risk category:  TR4 (4-6 points).

Significant change in size (>/= 20% in two dimensions and minimal
increase of 2 mm): Yes

Change in features: No

Change in ACR TI-RADS risk category: No

ACR TI-RADS recommendations:

**Given size (>/= 1.5 cm) and appearance, fine needle aspiration of
this moderately suspicious nodule should be considered based on
TI-RADS criteria.

_________________________________________________________

Nodule # 6:

Prior biopsy: No

Location: Left; Superior

Maximum size: 2.3 cm; Other 2 dimensions: 1.9 x 2.0 cm, previously,
2.2 x 2.1 x 1.8 cm

Composition: solid/almost completely solid (2)

Echogenicity: isoechoic (1)

Shape: not taller-than-wide (0)

Margins: smooth (0)

Echogenic foci: none (0)

ACR TI-RADS total points: 3.

ACR TI-RADS risk category:  TR3 (3 points).

Significant change in size (>/= 20% in two dimensions and minimal
increase of 2 mm): No

Change in features: No

Change in ACR TI-RADS risk category: No

ACR TI-RADS recommendations:

*Given size (>/= 1.5 - 2.4 cm) and appearance, a follow-up
ultrasound in 1 year should be considered based on TI-RADS criteria.

_________________________________________________________

Nodule # 7:

Prior biopsy: No

Location: Left; Superior

Maximum size: 2.6 cm; Other 2 dimensions: 2.4 x 1.4 cm, previously,
2.6 x 1.9 x 1.8 cm

Composition: solid/almost completely solid (2)

Echogenicity: isoechoic (1)

Shape: not taller-than-wide (0)

Margins: smooth (0)

Echogenic foci: none (0)

ACR TI-RADS total points: 3.

ACR TI-RADS risk category:  TR3 (3 points).

Significant change in size (>/= 20% in two dimensions and minimal
increase of 2 mm): No

Change in features: No

Change in ACR TI-RADS risk category: No

ACR TI-RADS recommendations:

**Given size (>/= 2.5 cm) and appearance, fine needle aspiration of
this mildly suspicious nodule should be considered based on TI-RADS
criteria.

_________________________________________________________

Nodule # 9:

Location: Left; Inferior

Maximum size: 1.6 cm; Other 2 dimensions: 1.3 x 1.3 cm

Composition: solid/almost completely solid (2)

Echogenicity: isoechoic (1)

Shape: not taller-than-wide (0)

Margins: smooth (0)

Echogenic foci: none (0)

ACR TI-RADS total points: 3.

ACR TI-RADS risk category: TR3 (3 points).

ACR TI-RADS recommendations:

Given size (<1.4 cm) and appearance, this nodule does NOT meet
TI-RADS criteria for biopsy or dedicated follow-up.

_________________________________________________________

Right lower nodule 3 measures 4.7 x 3.0 x 3.4 cm and previously
measured 3.5 x 2.6 x 4.0 cm. Biopsy was performed 03/14/2009.

Left lower nodule 8 measures 7.5 x 3.9 x 6.1 cm and previously
measured 4.2 x 5.5 x 3.7 cm. Biopsy was performed 03/14/2009.
IMPRESSION: Nodule 1 is stable and meets criteria for annual follow-up

Nodule 2 is stable and meets criteria for annual follow-up

Nodule 3 previously underwent biopsy. Correlation with prior biopsy
results is recommended.

Nodule 4 does not meet criteria for biopsy nor follow-up

Nodule 5 has significantly enlarged and meets criteria for fine
needle aspiration biopsy.

Nodule 6 is stable and meets criteria for annual follow-up.

Nodule 7 is stable and continues to meet criteria for fine needle
aspiration biopsy.

Nodule 8 previously underwent biopsy. Correlation with prior biopsy
results is recommended.

Nodule 9 does not meet criteria for biopsy nor follow-up.

The above is in keeping with the ACR TI-RADS recommendations - [HOSPITAL] 1158;[DATE].

## 2019-07-13 ENCOUNTER — Telehealth: Payer: Self-pay | Admitting: *Deleted

## 2019-07-13 NOTE — Telephone Encounter (Signed)
Mailbox is full - unable to leave a message. 

## 2019-07-28 ENCOUNTER — Ambulatory Visit (INDEPENDENT_AMBULATORY_CARE_PROVIDER_SITE_OTHER): Payer: Medicare HMO | Admitting: Family Medicine

## 2019-07-28 ENCOUNTER — Encounter: Payer: Self-pay | Admitting: Family Medicine

## 2019-07-28 ENCOUNTER — Other Ambulatory Visit: Payer: Self-pay

## 2019-07-28 VITALS — BP 150/80 | HR 63 | Temp 97.8°F | Resp 18 | Ht 65.0 in | Wt 180.4 lb

## 2019-07-28 DIAGNOSIS — I10 Essential (primary) hypertension: Secondary | ICD-10-CM

## 2019-07-28 DIAGNOSIS — Z23 Encounter for immunization: Secondary | ICD-10-CM | POA: Diagnosis not present

## 2019-07-28 DIAGNOSIS — M25512 Pain in left shoulder: Secondary | ICD-10-CM | POA: Diagnosis not present

## 2019-07-28 DIAGNOSIS — E1169 Type 2 diabetes mellitus with other specified complication: Secondary | ICD-10-CM | POA: Diagnosis not present

## 2019-07-28 DIAGNOSIS — F419 Anxiety disorder, unspecified: Secondary | ICD-10-CM | POA: Diagnosis not present

## 2019-07-28 DIAGNOSIS — E1165 Type 2 diabetes mellitus with hyperglycemia: Secondary | ICD-10-CM | POA: Diagnosis not present

## 2019-07-28 DIAGNOSIS — M25511 Pain in right shoulder: Secondary | ICD-10-CM | POA: Diagnosis not present

## 2019-07-28 DIAGNOSIS — E785 Hyperlipidemia, unspecified: Secondary | ICD-10-CM | POA: Diagnosis not present

## 2019-07-28 DIAGNOSIS — R059 Cough, unspecified: Secondary | ICD-10-CM

## 2019-07-28 DIAGNOSIS — R05 Cough: Secondary | ICD-10-CM

## 2019-07-28 MED ORDER — BENZONATATE 100 MG PO CAPS: 100.0000 mg | ORAL_CAPSULE | Freq: Three times a day (TID) | ORAL | 0 refills | Status: DC | PRN

## 2019-07-28 MED ORDER — MELOXICAM 7.5 MG PO TABS: 7.5000 mg | ORAL_TABLET | Freq: Every day | ORAL | 0 refills | Status: DC

## 2019-07-28 MED ORDER — ALPRAZOLAM 0.25 MG PO TABS: ORAL_TABLET | ORAL | 0 refills | Status: DC

## 2019-07-28 MED ORDER — METHOCARBAMOL 500 MG PO TABS: 500.0000 mg | ORAL_TABLET | Freq: Four times a day (QID) | ORAL | 0 refills | Status: DC

## 2019-07-28 NOTE — Assessment & Plan Note (Signed)
Well controlled, no changes to meds. Encouraged heart healthy diet such as the DASH diet and exercise as tolerated.  °

## 2019-07-28 NOTE — Assessment & Plan Note (Signed)
Encouraged heart healthy diet, increase exercise, avoid trans fats, consider a krill oil cap daily 

## 2019-07-28 NOTE — Progress Notes (Signed)
Patient ID: [name], female    DOB: [birth year]  Age: 73 y.o. 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   R > L usually but today the L hurts worse  She can not think of anything different she did to cause the pain to be worse on the L   Review of Systems  Constitutional: Negative for appetite change, diaphoresis, fatigue and unexpected weight change.  Eyes: Negative for pain, redness and visual disturbance.  Respiratory: Negative for cough, chest tightness, shortness of breath and wheezing.   Cardiovascular: Negative for chest pain, palpitations and leg swelling.  Endocrine: Negative for cold intolerance, heat intolerance, polydipsia, polyphagia and polyuria.  Genitourinary: Negative for difficulty urinating, dysuria and frequency.  Musculoskeletal: Positive for arthralgias and back pain. Negative for gait problem and neck pain.  Neurological: Negative for dizziness, light-headedness, numbness and headaches.    History Past Medical History:  Diagnosis Date   Blockage of coronary artery of heart (HCC)    Depression    Diabetes mellitus    Hyperlipidemia    Hypertension    Pancreatitis    Restless leg syndrome     She has a past surgical history that includes Tubal ligation; Rotator cuff repair; Coronary angioplasty with stent; Foot surgery (Right); Cataract extraction, bilateral; and Rotator cuff repair (Left).   Her family history includes Cancer in her mother and sister; Cirrhosis in her sister; Diabetes in her daughter, daughter, sister, sister, and son; Goiter in her daughter; Gout in her sister; Heart Problems in her sister; Heart attack in her brother, father, and son; Hypertension in her mother and sister; Sleep apnea in her sister; Vision loss in her mother.She reports that she has  been smoking cigarettes. She has been smoking about 0.20 packs per day. She has never used smokeless tobacco. She reports that she does not drink alcohol or use drugs.  Current Outpatient Medications on File Prior to Visit  Medication Sig Dispense Refill   alendronate (FOSAMAX) 70 MG tablet TAKE 1 TABLET BY MOUTH EVERY WEEK WITH FULL GLASS OF WATER ON AN EMPTY STOMACH 12 tablet 3   amLODipine (NORVASC) 10 MG tablet TAKE 1 TABLET BY MOUTH EVERY DAY 90 tablet 1   aspirin EC 81 MG tablet Take 81 mg by mouth daily.     atorvastatin (LIPITOR) 40 MG tablet TAKE 1 TABLET(40 MG) BY MOUTH DAILY 90 tablet 1   Blood Glucose Calibration (TAI DOC CONTROL) NORMAL SOLN      Blood Glucose Monitoring Suppl (ACCU-CHEK AVIVA PLUS) w/Device KIT Check blood sugar daily. E11.9 1 kit 0   Carboxymethylcellulose Sodium (THERATEARS OP) Place 1 drop into both eyes daily as needed (dry eyes).     Cyanocobalamin (VITAMIN B-12 PO) Take 1 tablet by mouth daily.     ferrous sulfate 325 (65 FE) MG tablet Take 325 mg by mouth daily with breakfast.     Flaxseed, Linseed, (FLAXSEED OIL PO) Take 1 capsule by mouth 2 (two) times daily.      FLUoxetine (PROZAC) 20 MG capsule TAKE 1 CAPSULE BY MOUTH EVERY DAY 90 capsule 1   fluticasone (FLONASE) 50 MCG/ACT nasal spray Place 2 sprays into both nostrils daily. (Patient taking differently: Place 2 sprays into both nostrils daily as needed. ) 16 g 6  fluticasone (FLONASE) 50 MCG/ACT nasal spray Place 2 sprays into both nostrils daily. 16 g 1   gabapentin (NEURONTIN) 300 MG capsule Take 1 capsule (300 mg total) by mouth 2 (two) times daily. If tolerating may take 3 times per day if needed. 180 capsule 1   GINKGO BILOBA COMPLEX PO Take 1 tablet by mouth daily.      glucose blood (ACCU-CHEK AVIVA) test strip Check blood sugar four times daily. E11.9 400 each 3   insulin regular (NOVOLIN R RELION) 100 units/mL injection Inject 0.3 mLs (30 Units total) into the skin daily as  needed for high blood sugar (at noon if CBG >200 and will be eating). 10 mL    Insulin Syringe-Needle U-100 (BD INSULIN SYRINGE U/F) 31G X 5/16" 0.3 ML MISC 10 Units by Does not apply route 3 (three) times daily before meals. 100 each 5   Lancet Devices (LANCING DEVICE) MISC      levocetirizine (XYZAL) 5 MG tablet Take 1 tablet (5 mg total) by mouth every evening. 30 tablet 5   lisinopril (ZESTRIL) 10 MG tablet TAKE 1 TABLET BY MOUTH EVERY DAY 90 tablet 1   metoprolol succinate (TOPROL-XL) 50 MG 24 hr tablet Take 1 tablet (50 mg total) by mouth daily. with food 90 tablet 1   Multiple Vitamin (MULTIVITAMIN WITH MINERALS) TABS Take 1 tablet by mouth daily.     neomycin-polymyxin-hydrocortisone (CORTISPORIN) 3.5-10000-1 OTIC suspension Place 4 drops into the left ear 3 (three) times daily. For 1 week 10 mL 0   NOVOLIN N RELION 100 UNIT/ML injection Inject 30-60 Units into the skin 2 (two) times daily. Inject 40 units subcutaneously before breakfast and 60 units at bedtime     vitamin E 400 UNIT capsule Take 400 Units by mouth daily.     No current facility-administered medications on file prior to visit.      Objective:  Objective  Physical Exam Vitals signs and nursing note reviewed.  Constitutional:      Appearance: She is well-developed.  HENT:     Head: Normocephalic and atraumatic.  Eyes:     Conjunctiva/sclera: Conjunctivae normal.  Neck:     Musculoskeletal: Normal range of motion and neck supple.     Thyroid: No thyromegaly.     Vascular: No carotid bruit or JVD.  Cardiovascular:     Rate and Rhythm: Normal rate and regular rhythm.     Heart sounds: Normal heart sounds. No murmur.  Pulmonary:     Effort: Pulmonary effort is normal. No respiratory distress.     Breath sounds: Normal breath sounds. No wheezing or rales.  Chest:     Chest wall: No tenderness.  Musculoskeletal:        General: Tenderness present.     Right shoulder: She exhibits tenderness, pain and  spasm. She exhibits no swelling.     Thoracic back: She exhibits tenderness, pain and spasm.       Back:       Arms:  Neurological:     Mental Status: She is alert and oriented to person, place, and time.    BP (!) 150/80 (BP Location: Right Arm, Patient Position: Sitting, Cuff Size: Normal)    Pulse 63    Temp 97.8 F (36.6 C) (Temporal)    Resp 18    Ht '5\' 5"'  (1.651 m)    Wt 180 lb 6.4 oz (81.8 kg)    SpO2 97%    BMI 30.02 kg/m  Wt Readings from  Last 3 Encounters:  07/28/19 180 lb 6.4 oz (81.8 kg)  04/22/19 179 lb 6.4 oz (81.4 kg)  12/29/18 178 lb 12.8 oz (81.1 kg)     Lab Results  Component Value Date   WBC 11.2 (H) 08/11/2018   HGB 12.3 08/11/2018   HCT 36.9 08/11/2018   PLT 221.0 08/11/2018   GLUCOSE 140 (H) 07/16/2018   CHOL 135 12/13/2017   TRIG 209.0 (H) 12/13/2017   HDL 32.90 (L) 12/13/2017   LDLDIRECT 70.0 12/13/2017   LDLCALC 80 02/18/2016   ALT 21 07/16/2018   AST 18 07/16/2018   NA 141 07/16/2018   K 4.0 07/16/2018   CL 104 07/16/2018   CREATININE 0.72 07/16/2018   BUN 16 07/16/2018   CO2 31 07/16/2018   TSH 0.58 08/11/2018   INR 1.1 03/03/2009   HGBA1C 10.0 (H) 07/18/2018   MICROALBUR 1.7 06/23/2015    No results found.   Assessment & Plan:  Plan  I am having [name] start on methocarbamol and meloxicam. I am also having her maintain her NovoLIN N ReliOn, multivitamin with minerals, Lancing Device, Tai Doc Control, aspirin EC, (Flaxseed, Linseed, (FLAXSEED OIL PO)), GINKGO BILOBA COMPLEX PO, fluticasone, Accu-Chek Aviva Plus, ferrous sulfate, Cyanocobalamin (VITAMIN B-12 PO), vitamin E, Carboxymethylcellulose Sodium (THERATEARS OP), insulin regular, gabapentin, Insulin Syringe-Needle U-100, glucose blood, fluticasone, atorvastatin, levocetirizine, FLUoxetine, alendronate, neomycin-polymyxin-hydrocortisone, amLODipine, lisinopril, metoprolol succinate, ALPRAZolam, and benzonatate.  Meds ordered this encounter  Medications   ALPRAZolam  (XANAX) 0.25 MG tablet    Sig: Take 1 tablet by mouth three times a day if needed    Dispense:  60 tablet    Refill:  0   benzonatate (TESSALON) 100 MG capsule    Sig: Take 1 capsule (100 mg total) by mouth 3 (three) times daily as needed for cough.    Dispense:  30 capsule    Refill:  0   methocarbamol (ROBAXIN) 500 MG tablet    Sig: Take 1 tablet (500 mg total) by mouth 4 (four) times daily.    Dispense:  45 tablet    Refill:  0   meloxicam (MOBIC) 7.5 MG tablet    Sig: Take 1 tablet (7.5 mg total) by mouth daily.    Dispense:  30 tablet    Refill:  0    Problem List Items Addressed This Visit      Unprioritized   Acute pain of both shoulders    Muscle relaxer  mobic Heat / ice If no improvement  Refer to ortho /sport med      Relevant Medications   methocarbamol (ROBAXIN) 500 MG tablet   meloxicam (MOBIC) 7.5 MG tablet   Diabetes mellitus type II, uncontrolled (HCC)    hgba1c to be checked , minimize simple carbs. Increase exercise as tolerated. Continue current meds       Essential hypertension    Well controlled, no changes to meds. Encouraged heart healthy diet such as the DASH diet and exercise as tolerated.       Hyperlipidemia LDL goal <70    Encouraged heart healthy diet, increase exercise, avoid trans fats, consider a krill oil cap daily       Other Visit Diagnoses    Need for influenza vaccination    -  Primary   Relevant Orders   Flu Vaccine QUAD High Dose(Fluad) (Completed)   Anxiety       Relevant Medications   ALPRAZolam (XANAX) 0.25 MG tablet   Cough  Relevant Medications   benzonatate (TESSALON) 100 MG capsule   Hyperlipidemia associated with type 2 diabetes mellitus ([place])          Follow-up: Return in about 3 months (around 10/28/2019), or if symptoms worsen or fail to improve, for hypertension, hyperlipidemia, diabetes II.  Ann Held, DO

## 2019-07-28 NOTE — Assessment & Plan Note (Signed)
Muscle relaxer  mobic Heat / ice If no improvement  Refer to ortho /sport med

## 2019-07-28 NOTE — Assessment & Plan Note (Signed)
hgba1c to be checked, minimize simple carbs. Increase exercise as tolerated. Continue current meds  

## 2019-07-29 LAB — COMPREHENSIVE METABOLIC PANEL
ALT: 20 U/L (ref 0–35)
AST: 18 U/L (ref 0–37)
Albumin: 4.3 g/dL (ref 3.5–5.2)
Alkaline Phosphatase: 62 U/L (ref 39–117)
BUN: 18 mg/dL (ref 6–23)
CO2: 29 mEq/L (ref 19–32)
Calcium: 9.4 mg/dL (ref 8.4–10.5)
Chloride: 100 mEq/L (ref 96–112)
Creatinine, Ser: 0.92 mg/dL (ref 0.40–1.20)
GFR: 72.29 mL/min (ref >60.00)
Glucose, Bld: 323 mg/dL — ABNORMAL HIGH (ref 70–99)
Potassium: 4.1 mEq/L (ref 3.5–5.1)
Sodium: 136 mEq/L (ref 135–145)
Total Bilirubin: 0.8 mg/dL (ref 0.2–1.2)
Total Protein: 6.5 g/dL (ref 6.0–8.3)

## 2019-07-29 LAB — MICROALBUMIN / CREATININE URINE RATIO
Creatinine,U: 63.4 mg/dL
Microalb Creat Ratio: 43 mg/g — ABNORMAL HIGH (ref 0.0–30.0)
Microalb, Ur: 27.3 mg/dL — ABNORMAL HIGH (ref 0.0–1.9)

## 2019-07-29 LAB — LIPID PANEL
Cholesterol: 143 mg/dL (ref 0–200)
HDL: 30.9 mg/dL — ABNORMAL LOW (ref >39.00)
LDL Cholesterol: 78 mg/dL (ref 0–99)
NonHDL: 112.1
Total CHOL/HDL Ratio: 5
Triglycerides: 173 mg/dL — ABNORMAL HIGH (ref 0.0–149.0)
VLDL: 34.6 mg/dL (ref 0.0–40.0)

## 2019-07-29 LAB — HEMOGLOBIN A1C: Hgb A1c MFr Bld: 10.1 % — ABNORMAL HIGH (ref 4.6–6.5)

## 2019-07-30 ENCOUNTER — Other Ambulatory Visit: Payer: Self-pay | Admitting: Family Medicine

## 2019-07-30 DIAGNOSIS — E785 Hyperlipidemia, unspecified: Secondary | ICD-10-CM

## 2019-07-30 DIAGNOSIS — R809 Proteinuria, unspecified: Secondary | ICD-10-CM

## 2019-07-30 MED ORDER — FENOFIBRATE 160 MG PO TABS: 160.0000 mg | ORAL_TABLET | Freq: Every day | ORAL | 2 refills | Status: DC

## 2019-08-04 ENCOUNTER — Other Ambulatory Visit (HOSPITAL_BASED_OUTPATIENT_CLINIC_OR_DEPARTMENT_OTHER): Payer: Self-pay | Admitting: Family Medicine

## 2019-08-04 DIAGNOSIS — Z1231 Encounter for screening mammogram for malignant neoplasm of breast: Secondary | ICD-10-CM

## 2019-08-07 ENCOUNTER — Encounter: Payer: Self-pay | Admitting: Family Medicine

## 2019-08-07 ENCOUNTER — Ambulatory Visit (INDEPENDENT_AMBULATORY_CARE_PROVIDER_SITE_OTHER): Payer: Medicare HMO | Admitting: Family Medicine

## 2019-08-07 ENCOUNTER — Other Ambulatory Visit: Payer: Self-pay

## 2019-08-07 DIAGNOSIS — F4329 Adjustment disorder with other symptoms: Secondary | ICD-10-CM

## 2019-08-07 DIAGNOSIS — F419 Anxiety disorder, unspecified: Secondary | ICD-10-CM

## 2019-08-07 DIAGNOSIS — F4321 Adjustment disorder with depressed mood: Secondary | ICD-10-CM

## 2019-08-07 NOTE — Progress Notes (Signed)
Virtual Visit via Telephone Note  I connected with [name] on 08/07/19 at  8:20 AM EDT by telephone and verified that I am speaking with the correct person using two identifiers.   Location: Patient: home Provider: office    I discussed the limitations, risks, security and privacy concerns of performing an evaluation and management service by telephone and the availability of in person appointments. I also discussed with the patient that there may be a patient responsible charge related to this service. The patient expressed understanding and agreed to proceed.   History of Present Illness: Pt is home -- she woke up thinking about things and she has been under a lot of stress.  She is not sleeping well.  She has had a lot of family pass away this year including 2 grandsons.  Her sister is in the hospital but is not doing well.    the xanax is helping some Observations/Objective: No fever No other vitals obtained Pt is not suicidal Assessment and Plan:  1. Complicated grief Refer to [facility] for counseling - Ambulatory referral to Psychology  2. Anxiety Prn xanax Refer to [facility] for counseling  - Ambulatory referral to Psychology   Follow Up Instructions:    I discussed the assessment and treatment plan with the patient. The patient was provided an opportunity to ask questions and all were answered. The patient agreed with the plan and demonstrated an understanding of the instructions.   The patient was advised to call back or seek an in-person evaluation if the symptoms worsen or if the condition fails to improve as anticipated.  I provided 15 minutes of non-face-to-face time during this encounter.   Ann Held, DO

## 2019-08-12 DIAGNOSIS — E113291 Type 2 diabetes mellitus with mild nonproliferative diabetic retinopathy without macular edema, right eye: Secondary | ICD-10-CM | POA: Diagnosis not present

## 2019-08-12 DIAGNOSIS — H35033 Hypertensive retinopathy, bilateral: Secondary | ICD-10-CM | POA: Diagnosis not present

## 2019-08-12 DIAGNOSIS — H40013 Open angle with borderline findings, low risk, bilateral: Secondary | ICD-10-CM | POA: Diagnosis not present

## 2019-08-12 DIAGNOSIS — Z961 Presence of intraocular lens: Secondary | ICD-10-CM | POA: Diagnosis not present

## 2019-08-18 ENCOUNTER — Encounter (HOSPITAL_BASED_OUTPATIENT_CLINIC_OR_DEPARTMENT_OTHER): Payer: Self-pay

## 2019-08-18 ENCOUNTER — Other Ambulatory Visit: Payer: Self-pay

## 2019-08-18 ENCOUNTER — Ambulatory Visit (HOSPITAL_BASED_OUTPATIENT_CLINIC_OR_DEPARTMENT_OTHER)
Admission: RE | Admit: 2019-08-18 | Discharge: 2019-08-18 | Disposition: A | Discharge status: Home / Self Care | Payer: Medicare HMO | Source: Ambulatory Visit | Attending: Family Medicine | Admitting: Family Medicine

## 2019-08-18 DIAGNOSIS — Z1231 Encounter for screening mammogram for malignant neoplasm of breast: Secondary | ICD-10-CM | POA: Diagnosis not present

## 2019-08-19 ENCOUNTER — Telehealth: Payer: Self-pay

## 2019-08-19 NOTE — Telephone Encounter (Signed)
Error

## 2019-08-25 ENCOUNTER — Telehealth: Payer: Self-pay | Admitting: *Deleted

## 2019-08-25 ENCOUNTER — Other Ambulatory Visit: Payer: Self-pay | Admitting: *Deleted

## 2019-08-25 DIAGNOSIS — M25511 Pain in right shoulder: Secondary | ICD-10-CM

## 2019-08-25 MED ORDER — FLUOXETINE HCL 20 MG PO CAPS: ORAL_CAPSULE | ORAL | 1 refills | Status: DC

## 2019-08-25 NOTE — Telephone Encounter (Signed)
Pharmacy sent over request for meloxicam.  Last ov notes states that if patient continues with shoulder pain to refer to sports med.  No answer/no vm

## 2019-08-26 ENCOUNTER — Telehealth: Payer: Self-pay | Admitting: *Deleted

## 2019-08-26 NOTE — Telephone Encounter (Signed)
Patient still having trouble with shoulders. Per note from last visit to send to ortho/sports med if still having trouble.  Advised patient that we will send referral.

## 2019-08-26 NOTE — Addendum Note (Signed)
Addended by: Kem Boroughs D on: 08/26/2019 12:31 PM   Modules accepted: Orders

## 2019-08-26 NOTE — Telephone Encounter (Signed)
Patient stated that she had called a week ago about cholesterol medication, if she should be on Lipitor and fenofibrate.    I did not see any message in chart about this and also I did not see any note on where you added fenofibrate.  Do you want patient to be on both?

## 2019-08-27 ENCOUNTER — Other Ambulatory Visit: Payer: Self-pay | Admitting: *Deleted

## 2019-08-27 DIAGNOSIS — M25512 Pain in left shoulder: Secondary | ICD-10-CM

## 2019-08-27 DIAGNOSIS — M25511 Pain in right shoulder: Secondary | ICD-10-CM

## 2019-08-27 MED ORDER — FLUOXETINE HCL 20 MG PO CAPS: ORAL_CAPSULE | ORAL | 1 refills | Status: DC

## 2019-08-27 MED ORDER — TRUEPLUS LANCETS 33G MISC: 1 refills | Status: DC

## 2019-08-27 MED ORDER — LISINOPRIL 10 MG PO TABS: 10.0000 mg | ORAL_TABLET | Freq: Every day | ORAL | 1 refills | Status: DC

## 2019-08-27 MED ORDER — BD SWAB SINGLE USE REGULAR PADS: MEDICATED_PAD | 1 refills | Status: DC

## 2019-08-27 MED ORDER — TRUE METRIX BLOOD GLUCOSE TEST VI STRP: ORAL_STRIP | 1 refills | Status: DC

## 2019-08-27 MED ORDER — TRUE METRIX AIR GLUCOSE METER W/DEVICE KIT: 1.0000 | PACK | Freq: Four times a day (QID) | 0 refills | Status: DC

## 2019-08-27 NOTE — Telephone Encounter (Signed)
NEED RX FOR ALPRAZOLAM SENT TO [facility]  Last written: 07/28/19 Last ov: 08/07/19 Next ov: NONE Contract: NONE UDS: NONE

## 2019-08-27 NOTE — Telephone Encounter (Signed)
Patient notified that she should be on both medications.

## 2019-08-27 NOTE — Telephone Encounter (Signed)
Yes-- she should be on both 

## 2019-08-27 NOTE — Addendum Note (Signed)
Addended by: Kem Boroughs D on: 08/27/2019 05:02 PM   Modules accepted: Orders

## 2019-09-04 ENCOUNTER — Other Ambulatory Visit: Payer: Self-pay | Admitting: Family Medicine

## 2019-09-10 ENCOUNTER — Ambulatory Visit: Payer: Medicare HMO | Admitting: Family Medicine

## 2019-09-10 NOTE — Progress Notes (Deleted)
[name] - 73 y.o. female MRN [id number]  Date of birth: 08/26/1946  SUBJECTIVE:  Including CC & ROS.  No chief complaint on file.   [name] is a 73 y.o. female that is  ***.  ***   Review of Systems  HISTORY: Past Medical, Surgical, Social, and Family History Reviewed & Updated per EMR.   Pertinent Historical Findings include:  Past Medical History:  Diagnosis Date  . Blockage of coronary artery of heart ([place])   . Depression   . Diabetes mellitus   . Hyperlipidemia   . Hypertension   . Pancreatitis   . Restless leg syndrome     Past Surgical History:  Procedure Laterality Date  . CATARACT EXTRACTION, BILATERAL    . CORONARY ANGIOPLASTY WITH STENT PLACEMENT    . FOOT SURGERY Right    cyst removal  . ROTATOR CUFF REPAIR     right  . ROTATOR CUFF REPAIR Left   . TUBAL LIGATION      Allergies  Allergen Reactions  . Levemir [Insulin Detemir] Hives and Swelling  . Metformin And Related Other (See Comments)    Sick on stomach, sweaty, throw up, pain    Family History  Problem Relation Age of Onset  . Heart attack Father   . Heart attack Brother   . Diabetes Sister   . Cirrhosis Sister   . Hypertension Sister   . Cancer Mother   . Hypertension Mother   . Vision loss Mother   . Cancer Sister   . Sleep apnea Sister   . Heart Problems Sister   . Diabetes Sister   . Gout Sister   . Diabetes Son   . Heart attack Son   . Diabetes Daughter   . Goiter Daughter   . Diabetes Daughter      Social History   Socioeconomic History  . Marital status: Married    Spouse name: Not on file  . Number of children: Not on file  . Years of education: Not on file  . Highest education level: Not on file  Occupational History  . Not on file  Social Needs  . Financial resource strain: Not on file  . Food insecurity    Worry: Not on file    Inability: Not on file  . Transportation needs    Medical: Not on file    Non-medical: Not on file  Tobacco  Use  . Smoking status: Current Some Day Smoker    Packs/day: 0.20    Types: Cigarettes  . Smokeless tobacco: Never Used  . Tobacco comment: pt states she is not ready  Substance and Sexual Activity  . Alcohol use: No  . Drug use: No  . Sexual activity: Not on file  Lifestyle  . Physical activity    Days per week: Not on file    Minutes per session: Not on file  . Stress: Not on file  Relationships  . Social Herbalist on phone: Not on file    Gets together: Not on file    Attends religious service: Not on file    Active member of club or organization: Not on file    Attends meetings of clubs or organizations: Not on file    Relationship status: Not on file  . Intimate partner violence    Fear of current or ex partner: Not on file    Emotionally abused: Not on file    Physically abused: Not on file  Forced sexual activity: Not on file  Other Topics Concern  . Not on file  Social History Narrative   Exercise: seldom   Caffeine use: seldom     PHYSICAL EXAM:  VS: There were no vitals taken for this visit. Physical Exam Gen: NAD, alert, cooperative with exam, well-appearing ENT: normal lips, normal nasal mucosa,  Eye: normal EOM, normal conjunctiva and lids CV:  no edema, +2 pedal pulses   Resp: no accessory muscle use, non-labored,  GI: no masses or tenderness, no hernia  Skin: no rashes, no areas of induration  Neuro: normal tone, normal sensation to touch Psych:  normal insight, alert and oriented MSK:  ***      ASSESSMENT & PLAN:   No problem-specific Assessment & Plan notes found for this encounter.

## 2019-09-15 ENCOUNTER — Other Ambulatory Visit: Payer: Self-pay | Admitting: Family Medicine

## 2019-09-15 ENCOUNTER — Other Ambulatory Visit: Payer: Self-pay | Admitting: *Deleted

## 2019-09-15 DIAGNOSIS — M25511 Pain in right shoulder: Secondary | ICD-10-CM

## 2019-09-15 DIAGNOSIS — M25512 Pain in left shoulder: Secondary | ICD-10-CM

## 2019-09-15 DIAGNOSIS — E785 Hyperlipidemia, unspecified: Secondary | ICD-10-CM

## 2019-09-15 MED ORDER — FLUOXETINE HCL 20 MG PO CAPS: ORAL_CAPSULE | ORAL | 1 refills | Status: DC

## 2019-09-15 MED ORDER — ALENDRONATE SODIUM 70 MG PO TABS: ORAL_TABLET | ORAL | 1 refills | Status: DC

## 2019-09-15 MED ORDER — FENOFIBRATE 160 MG PO TABS: 160.0000 mg | ORAL_TABLET | Freq: Every day | ORAL | 1 refills | Status: DC

## 2019-09-15 MED ORDER — MELOXICAM 7.5 MG PO TABS: 7.5000 mg | ORAL_TABLET | Freq: Every day | ORAL | 1 refills | Status: DC

## 2019-09-15 MED ORDER — LISINOPRIL 10 MG PO TABS: 10.0000 mg | ORAL_TABLET | Freq: Every day | ORAL | 1 refills | Status: DC

## 2019-09-15 MED ORDER — AMLODIPINE BESYLATE 10 MG PO TABS: 10.0000 mg | ORAL_TABLET | Freq: Every day | ORAL | 1 refills | Status: DC

## 2019-09-15 NOTE — Telephone Encounter (Signed)
Received fax request form [employer] mail order requesting Rxs of: alendronate, amlodipine, fenofibrate, fluoxetine, lisinopril and Meloxicam. All refills sent except Meloxicam. This sent to PCP for approval of 90 day supply if appropriate.

## 2019-09-21 ENCOUNTER — Ambulatory Visit: Payer: Medicare HMO | Admitting: Cardiology

## 2019-09-29 ENCOUNTER — Other Ambulatory Visit: Payer: Self-pay | Admitting: Family Medicine

## 2019-09-29 DIAGNOSIS — M25519 Pain in unspecified shoulder: Secondary | ICD-10-CM

## 2019-10-23 ENCOUNTER — Other Ambulatory Visit: Payer: Self-pay | Admitting: Family Medicine

## 2019-10-27 ENCOUNTER — Ambulatory Visit: Payer: Medicare HMO | Admitting: Family Medicine

## 2019-10-27 NOTE — Progress Notes (Deleted)
[name] - 74 y.o. female MRN [id number]  Date of birth: August 02, 1946  SUBJECTIVE:  Including CC & ROS.  No chief complaint on file.   [name] is a 74 y.o. female that is  ***.  ***   Review of Systems See HPI   HISTORY: Past Medical, Surgical, Social, and Family History Reviewed & Updated per EMR.   Pertinent Historical Findings include:  Past Medical History:  Diagnosis Date  . Blockage of coronary artery of heart ([place])   . Depression   . Diabetes mellitus   . Hyperlipidemia   . Hypertension   . Pancreatitis   . Restless leg syndrome     Past Surgical History:  Procedure Laterality Date  . CATARACT EXTRACTION, BILATERAL    . CORONARY ANGIOPLASTY WITH STENT PLACEMENT    . FOOT SURGERY Right    cyst removal  . ROTATOR CUFF REPAIR     right  . ROTATOR CUFF REPAIR Left   . TUBAL LIGATION      Allergies  Allergen Reactions  . Levemir [Insulin Detemir] Hives and Swelling  . Metformin And Related Other (See Comments)    Sick on stomach, sweaty, throw up, pain    Family History  Problem Relation Age of Onset  . Heart attack Father   . Heart attack Brother   . Diabetes Sister   . Cirrhosis Sister   . Hypertension Sister   . Cancer Mother   . Hypertension Mother   . Vision loss Mother   . Cancer Sister   . Sleep apnea Sister   . Heart Problems Sister   . Diabetes Sister   . Gout Sister   . Diabetes Son   . Heart attack Son   . Diabetes Daughter   . Goiter Daughter   . Diabetes Daughter      Social History   Socioeconomic History  . Marital status: Married    Spouse name: Not on file  . Number of children: Not on file  . Years of education: Not on file  . Highest education level: Not on file  Occupational History  . Not on file  Tobacco Use  . Smoking status: Current Some Day Smoker    Packs/day: 0.20    Types: Cigarettes  . Smokeless tobacco: Never Used  . Tobacco comment: pt states she is not ready  Substance and Sexual  Activity  . Alcohol use: No  . Drug use: No  . Sexual activity: Not on file  Other Topics Concern  . Not on file  Social History Narrative   Exercise: seldom   Caffeine use: seldom   Social Determinants of Health   Financial Resource Strain:   . Difficulty of Paying Living Expenses: Not on file  Food Insecurity:   . Worried About Charity fundraiser in the Last Year: Not on file  . Ran Out of Food in the Last Year: Not on file  Transportation Needs:   . Lack of Transportation (Medical): Not on file  . Lack of Transportation (Non-Medical): Not on file  Physical Activity:   . Days of Exercise per Week: Not on file  . Minutes of Exercise per Session: Not on file  Stress:   . Feeling of Stress : Not on file  Social Connections:   . Frequency of Communication with Friends and Family: Not on file  . Frequency of Social Gatherings with Friends and Family: Not on file  . Attends Religious Services: Not on file  .  Active Member of Clubs or Organizations: Not on file  . Attends Banker Meetings: Not on file  . Marital Status: Not on file  Intimate Partner Violence:   . Fear of Current or Ex-Partner: Not on file  . Emotionally Abused: Not on file  . Physically Abused: Not on file  . Sexually Abused: Not on file     PHYSICAL EXAM:  VS: There were no vitals taken for this visit. Physical Exam Gen: NAD, alert, cooperative with exam, well-appearing ENT: normal lips, normal nasal mucosa,  Eye: normal EOM, normal conjunctiva and lids Skin: no rashes, no areas of induration  Neuro: normal tone, normal sensation to touch Psych:  normal insight, alert and oriented MSK:  ***      ASSESSMENT & PLAN:   No problem-specific Assessment & Plan notes found for this encounter.

## 2019-10-28 ENCOUNTER — Ambulatory Visit: Payer: Medicare HMO | Admitting: Cardiology

## 2019-10-28 ENCOUNTER — Other Ambulatory Visit: Payer: Self-pay

## 2019-10-28 ENCOUNTER — Encounter: Payer: Self-pay | Admitting: Cardiology

## 2019-10-28 VITALS — BP 148/66 | HR 57 | Temp 96.8°F | Ht 65.0 in | Wt 175.0 lb

## 2019-10-28 DIAGNOSIS — IMO0002 Reserved for concepts with insufficient information to code with codable children: Secondary | ICD-10-CM

## 2019-10-28 DIAGNOSIS — Z712 Person consulting for explanation of examination or test findings: Secondary | ICD-10-CM

## 2019-10-28 DIAGNOSIS — I2510 Atherosclerotic heart disease of native coronary artery without angina pectoris: Secondary | ICD-10-CM

## 2019-10-28 DIAGNOSIS — E1165 Type 2 diabetes mellitus with hyperglycemia: Secondary | ICD-10-CM | POA: Diagnosis not present

## 2019-10-28 DIAGNOSIS — E118 Type 2 diabetes mellitus with unspecified complications: Secondary | ICD-10-CM

## 2019-10-28 DIAGNOSIS — E782 Mixed hyperlipidemia: Secondary | ICD-10-CM | POA: Diagnosis not present

## 2019-10-28 DIAGNOSIS — I10 Essential (primary) hypertension: Secondary | ICD-10-CM | POA: Diagnosis not present

## 2019-10-28 DIAGNOSIS — Z7189 Other specified counseling: Secondary | ICD-10-CM

## 2019-10-28 NOTE — Progress Notes (Signed)
Cardiology Office Note:    Date:  10/28/2019   ID:  [name], DOB 1946/04/24, MRN [id number]  PCP:  Carollee Herter, Alferd Apa, DO  Cardiologist:  Buford Dresser, MD PhD  Referring MD: Carollee Herter, Alferd Apa, *   CC: follow up  History of Present Illness:    [name] is a 74 y.o. female with a hx of CAD s/p stent(s), type II diabetes on insulin, hyperlipidemia, hypertension who is seen for follow up today. She was initially seen 07/2018 as a new consult at the request of Ann Held, * for the evaluation and management of chest pain.  Cardiac history: history of cardiac stent(s)--not sure where, what type, or how many. Last cath in 2001 by Dr. Naomie Dean. She denies any symptoms at the time.  Had echo and lexiscan in 2017 when she was having fatigue and diaphoresis. No chest discomfort or shortness of breath at that time.   Risk factors: -Alcohol: none -Tobacco: sometimes; about a pack per week -Comorbidities: hypertension, hyperlipidemia, type II diabetes -Exercise level: sedentary many days, still works part time with minimal activity at work. Has intermittent activity, like 2 weeks ago when she helped her children cook for a family party. Doesn't think she can treadmill due to chronic knee pain. Does occasionally climb stairs.  -Family history: brother and father both died of MI.  Today: Feeling well. No further chest pain. We reviewed the results of her nuclear stress test from 09/2018 today.   Questions re: PAD, what would it feel like, does she have it. Reviewed together. Strong pulses today.   BP elevated today, doesn't check at home. No symptoms. Did not sleep well overnight, had a stressful day yesterday.   Denies chest pain, shortness of breath at rest or with normal exertion. No PND, orthopnea, LE edema or unexpected weight gain. No syncope or palpitations.   Past Medical History:  Diagnosis Date  . Blockage of coronary artery of heart ([place])    . Depression   . Diabetes mellitus   . Hyperlipidemia   . Hypertension   . Pancreatitis   . Restless leg syndrome     Past Surgical History:  Procedure Laterality Date  . CATARACT EXTRACTION, BILATERAL    . CORONARY ANGIOPLASTY WITH STENT PLACEMENT    . FOOT SURGERY Right    cyst removal  . ROTATOR CUFF REPAIR     right  . ROTATOR CUFF REPAIR Left   . TUBAL LIGATION      Current Medications: Current Outpatient Medications on File Prior to Visit  Medication Sig  . Alcohol Swabs (B-D SINGLE USE SWABS REGULAR) PADS USE AS DIRECTED  . alendronate (FOSAMAX) 70 MG tablet TAKE 1 TABLET BY MOUTH EVERY WEEK WITH FULL GLASS OF WATER ON AN EMPTY STOMACH  . ALPRAZolam (XANAX) 0.25 MG tablet Take 1 tablet by mouth three times a day if needed  . amLODipine (NORVASC) 10 MG tablet Take 1 tablet (10 mg total) by mouth daily.  [name] aspirin EC 81 MG tablet Take 81 mg by mouth daily.  [name] atorvastatin (LIPITOR) 40 MG tablet TAKE 1 TABLET(40 MG) BY MOUTH DAILY  . benzonatate (TESSALON) 100 MG capsule Take 1 capsule (100 mg total) by mouth 3 (three) times daily as needed for cough.  . Blood Glucose Calibration (TAI DOC CONTROL) NORMAL SOLN   . Blood Glucose Monitoring Suppl (TRUE METRIX AIR GLUCOSE METER) w/Device KIT 1 each by Does not apply route 4 (four) times  daily. DX CODE E11.8  . Carboxymethylcellulose Sodium (THERATEARS OP) Place 1 drop into both eyes daily as needed (dry eyes).  . Cyanocobalamin (VITAMIN B-12 PO) Take 1 tablet by mouth daily.  . fenofibrate 160 MG tablet Take 1 tablet (160 mg total) by mouth daily.  . ferrous sulfate 325 (65 FE) MG tablet Take 325 mg by mouth daily with breakfast.  . Flaxseed, Linseed, (FLAXSEED OIL PO) Take 1 capsule by mouth 2 (two) times daily.   [name] FLUoxetine (PROZAC) 20 MG capsule TAKE 1 CAPSULE BY MOUTH EVERY DAY  . fluticasone (FLONASE) 50 MCG/ACT nasal spray Place 2 sprays into both nostrils daily. (Patient taking differently: Place 2 sprays into both  nostrils daily as needed. )  . fluticasone (FLONASE) 50 MCG/ACT nasal spray Place 2 sprays into both nostrils daily.  [name] gabapentin (NEURONTIN) 300 MG capsule Take 1 capsule (300 mg total) by mouth 2 (two) times daily. If tolerating may take 3 times per day if needed.  [name] GINKGO BILOBA COMPLEX PO Take 1 tablet by mouth daily.   [name] glucose blood (TRUE METRIX BLOOD GLUCOSE TEST) test strip USE AS DIRECTED 4 TIMES A DAY  . insulin regular (NOVOLIN R RELION) 100 units/mL injection Inject 0.3 mLs (30 Units total) into the skin daily as needed for high blood sugar (at noon if CBG >200 and will be eating).  . Insulin Syringe-Needle U-100 (BD INSULIN SYRINGE U/F) 31G X 5/16" 0.3 ML MISC 10 Units by Does not apply route 3 (three) times daily before meals.  Elmore Guise Devices (LANCING DEVICE) MISC   . levocetirizine (XYZAL) 5 MG tablet Take 1 tablet (5 mg total) by mouth every evening.  [name] lisinopril (ZESTRIL) 10 MG tablet Take 1 tablet (10 mg total) by mouth daily.  . meloxicam (MOBIC) 7.5 MG tablet Take 1 tablet (7.5 mg total) by mouth daily.  . methocarbamol (ROBAXIN) 500 MG tablet Take 1 tablet (500 mg total) by mouth 4 (four) times daily.  . metoprolol succinate (TOPROL-XL) 50 MG 24 hr tablet Take 1 tablet (50 mg total) by mouth daily. with food  . Multiple Vitamin (MULTIVITAMIN WITH MINERALS) TABS Take 1 tablet by mouth daily.  [name] neomycin-polymyxin-hydrocortisone (CORTISPORIN) 3.5-10000-1 OTIC suspension Place 4 drops into the left ear 3 (three) times daily. For 1 week  . NOVOLIN N RELION 100 UNIT/ML injection Inject 30-60 Units into the skin 2 (two) times daily. Inject 40 units subcutaneously before breakfast and 60 units at bedtime  . TRUEplus Lancets 33G MISC USE AS DIRECTED 4 TIMES A DAY  . vitamin E 400 UNIT capsule Take 400 Units by mouth daily.   No current facility-administered medications on file prior to visit.     Allergies:   Levemir [insulin detemir] and Metformin and related   Social  History   Tobacco Use  . Smoking status: Current Some Day Smoker    Packs/day: 0.20    Types: Cigarettes  . Smokeless tobacco: Never Used  . Tobacco comment: pt states she is not ready  Substance Use Topics  . Alcohol use: No  . Drug use: No    Family History: The patient's family history includes Cancer in her mother and sister; Cirrhosis in her sister; Diabetes in her daughter, daughter, sister, sister, and son; Goiter in her daughter; Gout in her sister; Heart Problems in her sister; Heart attack in her brother, father, and son; Hypertension in her mother and sister; Sleep apnea in her sister; Vision loss in her mother.  ROS:  Please see the history of present illness.  Additional pertinent ROS negative except as documented.  EKGs/Labs/Other Studies Reviewed:    The following studies were reviewed today: 02/19/16 Echo Study Conclusions  - Left ventricle: The cavity size was normal. There was mild   concentric hypertrophy. Systolic function was normal. The   estimated ejection fraction was in the range of 60% to 65%. Wall   motion was normal; there were no regional wall motion   abnormalities. Doppler parameters are consistent with abnormal   left ventricular relaxation (grade 1 diastolic dysfunction).   Doppler parameters are consistent with elevated ventricular   end-diastolic filling pressure. - Aortic valve: There was no regurgitation. - Aortic root: The aortic root was normal in size. - Mitral valve: Structurally normal valve. There was mild   regurgitation. - Left atrium: The atrium was normal in size. - Right ventricle: The cavity size was normal. Wall thickness was   normal. Systolic function was normal. - Tricuspid valve: There was mild regurgitation. - Pulmonary arteries: Systolic pressure was within the normal   range. - Inferior vena cava: The vessel was normal in size. - Pericardium, extracardiac: There was no pericardial effusion.  02/19/16 NM Stress  test-Lexiscan FINDINGS: Perfusion: Allowing for anterior apical breast attenuation, there is No significant decreased activity in the left ventricle on stress imaging to suggest reversible ischemia or infarction.  Wall Motion: Normal left ventricular wall motion. No left ventricular dilation.  Left Ventricular Ejection Fraction: 58 % End diastolic volume 83 m End systolic volume 35 ml  IMPRESSION: 1. No reversible ischemia or infarction. 2. Normal left ventricular wall motion. 3. Left ventricular ejection fraction 58% 4. Low-risk stress test findings*.  EKG:  ECG personally reviewed. ECG dated today shows sinus bradycardia, RBBB, PRWP, borderline LAFB  Recent Labs: 07/28/2019: ALT 20; BUN 18; Creatinine, Ser 0.92; Potassium 4.1; Sodium 136  Recent Lipid Panel    Component Value Date/Time   CHOL 143 07/28/2019 1450   TRIG 173.0 (H) 07/28/2019 1450   HDL 30.90 (L) 07/28/2019 1450   CHOLHDL 5 07/28/2019 1450   VLDL 34.6 07/28/2019 1450   LDLCALC 78 07/28/2019 1450   LDLDIRECT 70.0 12/13/2017 1108    Physical Exam:    VS:  BP (!) 148/66 (BP Location: Left Arm, Patient Position: Sitting, Cuff Size: Normal)   Pulse (!) 57   Temp (!) 96.8 F (36 C)   Ht '5\' 5"'  (1.651 m)   Wt 175 lb (79.4 kg)   SpO2 100%   BMI 29.12 kg/m     Wt Readings from Last 3 Encounters:  10/28/19 175 lb (79.4 kg)  07/28/19 180 lb 6.4 oz (81.8 kg)  04/22/19 179 lb 6.4 oz (81.4 kg)    GEN: Well nourished, well developed in no acute distress HEENT: Normal, moist mucous membranes NECK: No JVD CARDIAC: regular rhythm, normal S1 and S2, no rubs or gallops. No murmur. VASCULAR: Radial and DP pulses 2+ bilaterally. No carotid bruits RESPIRATORY:  Clear to auscultation without rales, wheezing or rhonchi  ABDOMEN: Soft, non-tender, non-distended MUSCULOSKELETAL:  Ambulates independently SKIN: Warm and dry, no edema NEUROLOGIC:  Alert and oriented x 3. No focal neuro deficits  noted. PSYCHIATRIC:  Normal affect   ASSESSMENT:    1. CAD in native artery   2. Encounter to discuss test results   3. Essential hypertension   4. Uncontrolled type 2 diabetes mellitus with complication (HCC)   5. Cardiac risk counseling   6. Counseling on health promotion and  disease prevention   7. Mixed hyperlipidemia    PLAN:    History of CAD with stents, mixed hyperlipidemia: no angina today -reviewed stress test results together -continue aspirin 81 mg, atorvastatin 40 mg. -LDL goal <70, last chest was 78. TG 173. On atorvastatin and fenofibrate  Type II diabetes: with ASCVD history -most recent A1c 10.1 -would like to have patient on either SGLT2i or GLP1RA -As patient is currently on insulin, will forward to primary physician/endocrinologist so that this can be further discussed, as insulin dosing would need to be monitored.  Hypertension: not at goal -on amlodipine 10 mg, lisinopril 10 mg, metoprolol succinate 50 mg -check home numbers, close follow up. If still above goal, would increase lisinopril -renal function 07/2019 with K 4.1, ,Cr 0.92  CV risk counseling and secondary prevention: -recommend heart healthy/Mediterranean diet, with whole grains, fruits, vegetable, fish, lean meats, nuts, and olive oil. Limit salt. -recommend moderate walking, 3-5 times/week for 30-50 minutes each session. Aim for at least 150 minutes.week. Goal should be pace of 3 miles/hours, or walking 1.5 miles in 30 minutes -recommend avoidance of tobacco products. Avoid excess alcohol. -Additional risk factor control:  -Diabetes: A1c is 10.1. Defer to endocrinology, working on control.   -Lipids: as above  -Blood pressure control: as above  -Weight: BMI 30. Counseled on lifestyle/weight loss as above  Plan for follow up: 4 weeks for blood pressure check  Medication Adjustments/Labs and Tests Ordered: Current medicines are reviewed at length with the patient today.  Concerns regarding  medicines are outlined above.  Orders Placed This Encounter  Procedures  . EKG 12-Lead   No orders of the defined types were placed in this encounter.   Patient Instructions  Medication Instructions:  NO CHANGES *If you need a refill on your cardiac medications before your next appointment, please call your pharmacy*  Lab Work: NONE If you have labs (blood work) drawn today and your tests are completely normal, you will receive your results only by: [name] MyChart Message (if you have MyChart) OR . A paper copy in the mail If you have any lab test that is abnormal or we need to change your treatment, we will call you to review the results.    Follow-Up: At [facility], you and your health needs are our priority.  As part of our continuing mission to provide you with exceptional heart care, we have created designated Provider Care Teams.  These Care Teams include your primary Cardiologist (physician) and Advanced Practice Providers (APPs -  Physician Assistants and Nurse Practitioners) who all work together to provide you with the care you need, when you need it.  Your next appointment:   4 week(s)  The format for your next appointment:   Virtual Visit   Provider:   Buford Dresser, MD  Other Instructions FOLLOW UP WITH DR. Harrell Gave WITH YOUR BLOOD PRESSURES. IF YOU CAN TAKE IT DAILY IT IS PREFERRED.     Signed, Buford Dresser, MD PhD 10/28/2019  [place]

## 2019-10-28 NOTE — Patient Instructions (Signed)
Medication Instructions:  NO CHANGES *If you need a refill on your cardiac medications before your next appointment, please call your pharmacy*  Lab Work: NONE If you have labs (blood work) drawn today and your tests are completely normal, you will receive your results only by: [name] MyChart Message (if you have MyChart) OR . A paper copy in the mail If you have any lab test that is abnormal or we need to change your treatment, we will call you to review the results.    Follow-Up: At [facility] you and your health needs are our priority.  As part of our continuing mission to provide you with exceptional heart care, we have created designated Provider Care Teams.  These Care Teams include your primary Cardiologist (physician) and Advanced Practice Providers (APPs -  Physician Assistants and Nurse Practitioners) who all work together to provide you with the care you need, when you need it.  Your next appointment:   4 week(s)  The format for your next appointment:   Virtual Visit   Provider:   Jodelle Red, MD  Other Instructions FOLLOW UP WITH DR. Cristal Deer WITH YOUR BLOOD PRESSURES. IF YOU CAN TAKE IT DAILY IT IS PREFERRED.

## 2019-11-01 ENCOUNTER — Encounter: Payer: Self-pay | Admitting: Cardiology

## 2019-11-02 ENCOUNTER — Other Ambulatory Visit: Payer: Self-pay | Admitting: *Deleted

## 2019-11-02 DIAGNOSIS — E785 Hyperlipidemia, unspecified: Secondary | ICD-10-CM

## 2019-11-02 MED ORDER — FENOFIBRATE 160 MG PO TABS: 160.0000 mg | ORAL_TABLET | Freq: Every day | ORAL | 1 refills | Status: DC

## 2019-11-04 ENCOUNTER — Other Ambulatory Visit: Payer: Self-pay | Admitting: Family Medicine

## 2019-11-04 DIAGNOSIS — F419 Anxiety disorder, unspecified: Secondary | ICD-10-CM

## 2019-11-04 NOTE — Telephone Encounter (Signed)
Requesting: Xanax Contract: 08/23/2016 UDS:09/10/16 Last OV: 08/07/2019 Next OV: 05/12/20 with Lawanna Kobus Last Refill: 07/28/2019, #60--0 RF Database:   Please advise

## 2019-11-04 NOTE — Telephone Encounter (Signed)
Good Afternoon, patient called states needs her prescription refilled. ALPRAZolam (XANAX) 0.25 MG tablet  [facility]: [address] [place] [phone]

## 2019-11-05 ENCOUNTER — Telehealth: Payer: Self-pay | Admitting: *Deleted

## 2019-11-05 MED ORDER — ALPRAZOLAM 0.25 MG PO TABS: ORAL_TABLET | ORAL | 0 refills | Status: DC

## 2019-11-05 NOTE — Telephone Encounter (Signed)
[facility] [place] requesting refill on alprazolam  Last written: 07/28/19 Last ov: 08/07/19 Next ov: none Contract: none UDS: none

## 2019-11-22 ENCOUNTER — Other Ambulatory Visit: Payer: Self-pay | Admitting: Family Medicine

## 2019-11-25 ENCOUNTER — Other Ambulatory Visit: Payer: Self-pay | Admitting: *Deleted

## 2019-11-25 MED ORDER — METOPROLOL SUCCINATE ER 50 MG PO TB24: 50.0000 mg | ORAL_TABLET | Freq: Every day | ORAL | 0 refills | Status: DC

## 2019-11-27 ENCOUNTER — Telehealth: Payer: Medicare HMO | Admitting: Cardiology

## 2019-12-02 ENCOUNTER — Telehealth (INDEPENDENT_AMBULATORY_CARE_PROVIDER_SITE_OTHER): Payer: Medicare HMO | Admitting: Cardiology

## 2019-12-02 ENCOUNTER — Encounter: Payer: Self-pay | Admitting: Cardiology

## 2019-12-02 VITALS — BP 150/75 | HR 56

## 2019-12-02 DIAGNOSIS — I2510 Atherosclerotic heart disease of native coronary artery without angina pectoris: Secondary | ICD-10-CM

## 2019-12-02 DIAGNOSIS — E1165 Type 2 diabetes mellitus with hyperglycemia: Secondary | ICD-10-CM

## 2019-12-02 DIAGNOSIS — I10 Essential (primary) hypertension: Secondary | ICD-10-CM | POA: Diagnosis not present

## 2019-12-02 DIAGNOSIS — E782 Mixed hyperlipidemia: Secondary | ICD-10-CM | POA: Diagnosis not present

## 2019-12-02 DIAGNOSIS — IMO0002 Reserved for concepts with insufficient information to code with codable children: Secondary | ICD-10-CM

## 2019-12-02 DIAGNOSIS — E118 Type 2 diabetes mellitus with unspecified complications: Secondary | ICD-10-CM

## 2019-12-02 MED ORDER — LISINOPRIL 20 MG PO TABS: 20.0000 mg | ORAL_TABLET | Freq: Every day | ORAL | 3 refills | Status: DC

## 2019-12-02 NOTE — Progress Notes (Signed)
Virtual Visit via Telephone Note   This visit type was conducted due to national recommendations for restrictions regarding the COVID-19 Pandemic (e.g. social distancing) in an effort to limit this patient's exposure and mitigate transmission in our community.  Due to her co-morbid illnesses, this patient is at least at moderate risk for complications without adequate follow up.  This format is felt to be most appropriate for this patient at this time.  The patient did not have access to video technology/had technical difficulties with video requiring transitioning to audio format only (telephone).  All issues noted in this document were discussed and addressed.  No physical exam could be performed with this format.  Please refer to the patient's chart for her  consent to telehealth for [facility].   Date:  12/02/2019   ID:  [name], DOB 27-Apr-1946, MRN [id number]  Patient Location: Home Provider Location: Home  PCP:  Ann Held, DO  Cardiologist:  Buford Dresser, MD  Electrophysiologist:  None   Evaluation Performed:  Follow-Up Visit  Chief Complaint:  Blood pressure follow up/management  History of Present Illness:    [name] is a 74 y.o. female with a hx of CAD s/p stent(s), type II diabetes on insulin, hyperlipidemia, hypertension who is seen for follow up today. She was initially seen 07/2018 as a new consult at the request of Ann Held, * for the evaluation and management of chest pain.  Cardiac history: history of cardiac stent(s)--not sure where, what type, or how many. Last cath in 2001 by Dr. Naomie Dean. She denies any symptoms at the time.  Had echo and lexiscan in 2017 when she was having fatigue and diaphoresis. No chest discomfort or shortness of breath at that time.   Risk factors: -Alcohol: none -Tobacco: sometimes; about a pack per week -Comorbidities: hypertension, hyperlipidemia, type II diabetes -Exercise level:  sedentary many days, still works part time with minimal activity at work. Has intermittent activity, like 2 weeks ago when she helped her children cook for a family party. Doesn't think she can treadmill due to chronic knee pain. Does occasionally climb stairs.  -Family history: brother and father both died of MI.  The patient does not have symptoms concerning for COVID-19 infection (fever, chills, cough, or new shortness of breath).   Today: Blood pressure actually higher than last visit. Has been trying to cut back on the salt, but has been under stress. Home numbers have been ranging, usually highest in the morning. Lowest 146/66, most in the 140s-150s.   Several high readings since checking: 2/16 182/72 in the evening 2/19 172/80 2/23 168/75  Denies chest pain, shortness of breath at rest or with normal exertion. No PND, orthopnea, LE edema or unexpected weight gain. No syncope or palpitations.  Past Medical History:  Diagnosis Date  . Blockage of coronary artery of heart ([place])   . Depression   . Diabetes mellitus   . Hyperlipidemia   . Hypertension   . Pancreatitis   . Restless leg syndrome    Past Surgical History:  Procedure Laterality Date  . CATARACT EXTRACTION, BILATERAL    . CORONARY ANGIOPLASTY WITH STENT PLACEMENT    . FOOT SURGERY Right    cyst removal  . ROTATOR CUFF REPAIR     right  . ROTATOR CUFF REPAIR Left   . TUBAL LIGATION       Current Meds  Medication Sig  . Alcohol Swabs (B-D SINGLE USE SWABS  REGULAR) PADS USE AS DIRECTED  . alendronate (FOSAMAX) 70 MG tablet TAKE 1 TABLET BY MOUTH EVERY WEEK WITH FULL GLASS OF WATER ON AN EMPTY STOMACH  . ALPRAZolam (XANAX) 0.25 MG tablet Take 1 tablet by mouth three times a day if needed  . amLODipine (NORVASC) 10 MG tablet Take 1 tablet (10 mg total) by mouth daily.  [name] aspirin EC 81 MG tablet Take 81 mg by mouth daily.  [name] atorvastatin (LIPITOR) 40 MG tablet TAKE 1 TABLET(40 MG) BY MOUTH DAILY  . benzonatate  (TESSALON) 100 MG capsule Take 1 capsule (100 mg total) by mouth 3 (three) times daily as needed for cough.  . Blood Glucose Calibration (TAI DOC CONTROL) NORMAL SOLN   . Blood Glucose Monitoring Suppl (TRUE METRIX AIR GLUCOSE METER) w/Device KIT 1 each by Does not apply route 4 (four) times daily. DX CODE E11.8  . Carboxymethylcellulose Sodium (THERATEARS OP) Place 1 drop into both eyes daily as needed (dry eyes).  . fenofibrate 160 MG tablet Take 1 tablet (160 mg total) by mouth daily.  . ferrous sulfate 325 (65 FE) MG tablet Take 325 mg by mouth daily with breakfast.  . Flaxseed, Linseed, (FLAXSEED OIL PO) Take 1 capsule by mouth 2 (two) times daily.   [name] FLUoxetine (PROZAC) 20 MG capsule TAKE 1 CAPSULE BY MOUTH EVERY DAY  . fluticasone (FLONASE) 50 MCG/ACT nasal spray Place 2 sprays into both nostrils daily.  [name] gabapentin (NEURONTIN) 300 MG capsule Take 1 capsule (300 mg total) by mouth 2 (two) times daily. If tolerating may take 3 times per day if needed.  [name] GINKGO BILOBA COMPLEX PO Take 1 tablet by mouth daily.   [name] glucose blood (TRUE METRIX BLOOD GLUCOSE TEST) test strip USE AS DIRECTED 4 TIMES A DAY  . insulin regular (NOVOLIN R RELION) 100 units/mL injection Inject 0.3 mLs (30 Units total) into the skin daily as needed for high blood sugar (at noon if CBG >200 and will be eating).  . Insulin Syringe-Needle U-100 (BD INSULIN SYRINGE U/F) 31G X 5/16" 0.3 ML MISC 10 Units by Does not apply route 3 (three) times daily before meals.  Elmore Guise Devices (LANCING DEVICE) MISC   . lisinopril (ZESTRIL) 20 MG tablet Take 1 tablet (20 mg total) by mouth daily.  . methocarbamol (ROBAXIN) 500 MG tablet Take 1 tablet (500 mg total) by mouth 4 (four) times daily.  . metoprolol succinate (TOPROL-XL) 50 MG 24 hr tablet Take 1 tablet (50 mg total) by mouth daily. with food  . Multiple Vitamin (MULTIVITAMIN WITH MINERALS) TABS Take 1 tablet by mouth daily.  [name] neomycin-polymyxin-hydrocortisone (CORTISPORIN)  3.5-10000-1 OTIC suspension Place 4 drops into the left ear 3 (three) times daily. For 1 week (Patient taking differently: Place 4 drops into the left ear 3 (three) times daily. As needed)  . NOVOLIN N RELION 100 UNIT/ML injection Inject 30-60 Units into the skin 2 (two) times daily. Inject 40 units subcutaneously before breakfast and 60 units at bedtime  . TRUEplus Lancets 33G MISC USE AS DIRECTED 4 TIMES A DAY  . vitamin E 400 UNIT capsule Take 400 Units by mouth daily.  . [DISCONTINUED] lisinopril (ZESTRIL) 10 MG tablet TAKE 1 TABLET BY MOUTH EVERY DAY     Allergies:   Levemir [insulin detemir] and Metformin and related   Social History   Tobacco Use  . Smoking status: Current Some Day Smoker    Packs/day: 0.20    Types: Cigarettes  . Smokeless tobacco: Never  Used  . Tobacco comment: pt states she is not ready  Substance Use Topics  . Alcohol use: No  . Drug use: No     Family Hx: The patient's family history includes Cancer in her mother and sister; Cirrhosis in her sister; Diabetes in her daughter, daughter, sister, sister, and son; Goiter in her daughter; Gout in her sister; Heart Problems in her sister; Heart attack in her brother, father, and son; Hypertension in her mother and sister; Sleep apnea in her sister; Vision loss in her mother.  ROS:   Please see the history of present illness.    All other systems reviewed and are negative.   Prior CV studies:   The following studies were reviewed today: No new since last visit  Labs/Other Tests and Data Reviewed:    EKG:  An ECG dated 10/28/19 was personally reviewed today and demonstrated:  sinus bradycardia, RBBB, PRWP, borderline LAFB  Recent Labs: 07/28/2019: ALT 20; BUN 18; Creatinine, Ser 0.92; Potassium 4.1; Sodium 136   Recent Lipid Panel Lab Results  Component Value Date/Time   CHOL 143 07/28/2019 02:50 PM   TRIG 173.0 (H) 07/28/2019 02:50 PM   HDL 30.90 (L) 07/28/2019 02:50 PM   CHOLHDL 5 07/28/2019 02:50  PM   LDLCALC 78 07/28/2019 02:50 PM   LDLDIRECT 70.0 12/13/2017 11:08 AM    Wt Readings from Last 3 Encounters:  10/28/19 175 lb (79.4 kg)  07/28/19 180 lb 6.4 oz (81.8 kg)  04/22/19 179 lb 6.4 oz (81.4 kg)     Objective:    Vital Signs:  BP (!) 150/75   Pulse (!) 56    Speaking comfortably on the phone, no audible wheezing In no acute distress Alert and oriented Normal affect Normal speech  ASSESSMENT & PLAN:    Hypertension: not at goal today, ideal <130/80 -on amlodipine 10 mg, metoprolol succinate 50 mg daily -renal function 07/2019 with K 4.1, Cr 0.92 -will increase to lisinopril 20 mg daily -continue checking home blood pressures -follow up closely -recheck BMET after stable dosing  History of CAD with stents, mixed hyperlipidemia: no chest pain -continue aspirin 81 mg, atorvastatin 40 mg. -LDL goal <70, last chest was 78. TG 173. On atorvastatin and fenofibrate  Type II diabetes: with ASCVD history -most recent A1c 10.1 -would like to have patient on either SGLT2i or GLP1RA, defer to PCP/endo as she is on insulin  CV risk counseling and secondary prevention: -recommend heart healthy/Mediterranean diet, with whole grains, fruits, vegetable, fish, lean meats, nuts, and olive oil. Limit salt. -recommend moderate walking, 3-5 times/week for 30-50 minutes each session. Aim for at least 150 minutes.week. Goal should be pace of 3 miles/hours, or walking 1.5 miles in 30 minutes -recommend avoidance of tobacco products. Avoid excess alcohol. -Weight: BMI 30. Counseled on lifestyle/weight loss as above  COVID-19 Education: The signs and symptoms of COVID-19 were discussed with the patient and how to seek care for testing (follow up with PCP or arrange E-visit).  The importance of social distancing was discussed today.  Time:   Today, I have spent minutes via phone with the patient with telehealth technology discussing the above problems.  Additional time spent in  review and documentation.   Medication Adjustments/Labs and Tests Ordered: Current medicines are reviewed at length with the patient today.  Concerns regarding medicines are outlined above.   Tests Ordered: No orders of the defined types were placed in this encounter.   Medication Changes: Meds ordered this encounter  Medications  . lisinopril (ZESTRIL) 20 MG tablet    Sig: Take 1 tablet (20 mg total) by mouth daily.    Dispense:  90 tablet    Refill:  3    Please do not fill until patient calls--she will use her home supply of 10 mg pills first.    Follow Up:  1 month  Signed, Buford Dresser, MD  12/02/2019 8:26 AM    [place]

## 2019-12-02 NOTE — Patient Instructions (Signed)
Medication Instructions:  Increase Lisinopril to 20 mg daily  *If you need a refill on your cardiac medications before your next appointment, please call your pharmacy*  Lab Work: None  Testing/Procedures: None  Follow-Up: At [facility], you and your health needs are our priority.  As part of our continuing mission to provide you with exceptional heart care, we have created designated Provider Care Teams.  These Care Teams include your primary Cardiologist (physician) and Advanced Practice Providers (APPs -  Physician Assistants and Nurse Practitioners) who all work together to provide you with the care you need, when you need it.  Your next appointment:   1 month(s)  The format for your next appointment:   Virtual Visit   Provider:   Jodelle Red, MD

## 2020-01-06 ENCOUNTER — Telehealth (INDEPENDENT_AMBULATORY_CARE_PROVIDER_SITE_OTHER): Payer: Medicare HMO | Admitting: Cardiology

## 2020-01-06 DIAGNOSIS — Z7189 Other specified counseling: Secondary | ICD-10-CM

## 2020-01-06 DIAGNOSIS — Z794 Long term (current) use of insulin: Secondary | ICD-10-CM | POA: Diagnosis not present

## 2020-01-06 DIAGNOSIS — E118 Type 2 diabetes mellitus with unspecified complications: Secondary | ICD-10-CM

## 2020-01-06 DIAGNOSIS — E782 Mixed hyperlipidemia: Secondary | ICD-10-CM | POA: Diagnosis not present

## 2020-01-06 DIAGNOSIS — Z720 Tobacco use: Secondary | ICD-10-CM | POA: Diagnosis not present

## 2020-01-06 DIAGNOSIS — Z7982 Long term (current) use of aspirin: Secondary | ICD-10-CM | POA: Diagnosis not present

## 2020-01-06 DIAGNOSIS — I2510 Atherosclerotic heart disease of native coronary artery without angina pectoris: Secondary | ICD-10-CM | POA: Diagnosis not present

## 2020-01-06 DIAGNOSIS — Z955 Presence of coronary angioplasty implant and graft: Secondary | ICD-10-CM | POA: Diagnosis not present

## 2020-01-06 DIAGNOSIS — E119 Type 2 diabetes mellitus without complications: Secondary | ICD-10-CM

## 2020-01-06 DIAGNOSIS — I10 Essential (primary) hypertension: Secondary | ICD-10-CM | POA: Diagnosis not present

## 2020-01-06 MED ORDER — LISINOPRIL 40 MG PO TABS: 40.0000 mg | ORAL_TABLET | Freq: Every day | ORAL | 3 refills | Status: DC

## 2020-01-06 NOTE — Patient Instructions (Signed)
Medication Instructions:  Increase Lisinopril to 40 mg daily.  *If you need a refill on your cardiac medications before your next appointment, please call your pharmacy*   Lab Work: Your physician recommends that you return for lab work in 2 weeks (BMP).    Testing/Procedures: None   Follow-Up: At [facility], you and your health needs are our priority.  As part of our continuing mission to provide you with exceptional heart care, we have created designated Provider Care Teams.  These Care Teams include your primary Cardiologist (physician) and Advanced Practice Providers (APPs -  Physician Assistants and Nurse Practitioners) who all work together to provide you with the care you need, when you need it.  We recommend signing up for the patient portal called "MyChart".  Sign up information is provided on this After Visit Summary.  MyChart is used to connect with patients for Virtual Visits (Telemedicine).  Patients are able to view lab/test results, encounter notes, upcoming appointments, etc.  Non-urgent messages can be sent to your provider as well.   To learn more about what you can do with MyChart, go to [email or web address].    Your next appointment:   3 month(s)  The format for your next appointment:   In Person  Provider:   Jodelle Red, MD

## 2020-01-06 NOTE — Progress Notes (Signed)
Virtual Visit via Telephone Note   This visit type was conducted due to national recommendations for restrictions regarding the COVID-19 Pandemic (e.g. social distancing) in an effort to limit this patient's exposure and mitigate transmission in our community.  Due to her co-morbid illnesses, this patient is at least at moderate risk for complications without adequate follow up.  This format is felt to be most appropriate for this patient at this time.  The patient did not have access to video technology/had technical difficulties with video requiring transitioning to audio format only (telephone).  All issues noted in this document were discussed and addressed.  No physical exam could be performed with this format.  Please refer to the patient's chart for her  consent to telehealth for [facility].   Date:  01/06/2020   ID:  [name], DOB March 30, 1946, MRN [id number]  Patient Location: Home Provider Location: Home  PCP:  Ann Held, DO  Cardiologist:  Buford Dresser, MD  Electrophysiologist:  None   Evaluation Performed:  Follow-Up Visit  Chief Complaint:  Blood pressure follow up/management  History of Present Illness:    [name] is a 74 y.o. female with a hx of CAD s/p stent(s), type II diabetes on insulin, hyperlipidemia, hypertension who is seen for follow up today. She was initially seen 07/2018 as a new consult at the request of Ann Held, * for the evaluation and management of chest pain.  Cardiac history: history of cardiac stent(s)--not sure where, what type, or how many. Last cath in 2001 by Dr. Naomie Dean. She denies any symptoms at the time.  Had echo and lexiscan in 2017 when she was having fatigue and diaphoresis. No chest discomfort or shortness of breath at that time.   Risk factors: -Alcohol: none -Tobacco: sometimes; about a pack per week -Comorbidities: hypertension, hyperlipidemia, type II diabetes -Exercise level:  sedentary many days, still works part time with minimal activity at work. Has intermittent activity, like 2 weeks ago when she helped her children cook for a family party. Doesn't think she can treadmill due to chronic knee pain. Does occasionally climb stairs.  -Family history: brother and father both died of MI.  The patient does not have symptoms concerning for COVID-19 infection (fever, chills, cough, or new shortness of breath).   Today: Blood pressure still variable. 145/71, 54 this AM. Most have been uppers 140s/60s, heart rate in the 50s. No very high readings.   Denies chest pain, shortness of breath at rest or with normal exertion. No PND, orthopnea, LE edema or unexpected weight gain. No syncope or palpitations.  Past Medical History:  Diagnosis Date  . Blockage of coronary artery of heart ([place])   . Depression   . Diabetes mellitus   . Hyperlipidemia   . Hypertension   . Pancreatitis   . Restless leg syndrome    Past Surgical History:  Procedure Laterality Date  . CATARACT EXTRACTION, BILATERAL    . CORONARY ANGIOPLASTY WITH STENT PLACEMENT    . FOOT SURGERY Right    cyst removal  . ROTATOR CUFF REPAIR     right  . ROTATOR CUFF REPAIR Left   . TUBAL LIGATION       Current Meds  Medication Sig  . Alcohol Swabs (B-D SINGLE USE SWABS REGULAR) PADS USE AS DIRECTED  . alendronate (FOSAMAX) 70 MG tablet TAKE 1 TABLET BY MOUTH EVERY WEEK WITH FULL GLASS OF WATER ON AN EMPTY STOMACH  .  ALPRAZolam (XANAX) 0.25 MG tablet Take 1 tablet by mouth three times a day if needed  . amLODipine (NORVASC) 10 MG tablet Take 1 tablet (10 mg total) by mouth daily.  [name] aspirin EC 81 MG tablet Take 81 mg by mouth daily.  [name] atorvastatin (LIPITOR) 40 MG tablet TAKE 1 TABLET(40 MG) BY MOUTH DAILY  . benzonatate (TESSALON) 100 MG capsule Take 1 capsule (100 mg total) by mouth 3 (three) times daily as needed for cough.  . Blood Glucose Calibration (TAI DOC CONTROL) NORMAL SOLN   . Blood Glucose  Monitoring Suppl (TRUE METRIX AIR GLUCOSE METER) w/Device KIT 1 each by Does not apply route 4 (four) times daily. DX CODE E11.8  . Carboxymethylcellulose Sodium (THERATEARS OP) Place 1 drop into both eyes daily as needed (dry eyes).  . fenofibrate 160 MG tablet Take 1 tablet (160 mg total) by mouth daily.  . ferrous sulfate 325 (65 FE) MG tablet Take 325 mg by mouth daily with breakfast.  . Flaxseed, Linseed, (FLAXSEED OIL PO) Take 1 capsule by mouth 2 (two) times daily.   [name] FLUoxetine (PROZAC) 20 MG capsule TAKE 1 CAPSULE BY MOUTH EVERY DAY  . fluticasone (FLONASE) 50 MCG/ACT nasal spray Place 2 sprays into both nostrils daily as needed for allergies or rhinitis.  [name] gabapentin (NEURONTIN) 300 MG capsule Take 1 capsule (300 mg total) by mouth 2 (two) times daily. If tolerating may take 3 times per day if needed.  [name] glucose blood (TRUE METRIX BLOOD GLUCOSE TEST) test strip USE AS DIRECTED 4 TIMES A DAY  . insulin regular (NOVOLIN R RELION) 100 units/mL injection Inject 0.3 mLs (30 Units total) into the skin daily as needed for high blood sugar (at noon if CBG >200 and will be eating).  . Insulin Syringe-Needle U-100 (BD INSULIN SYRINGE U/F) 31G X 5/16" 0.3 ML MISC 10 Units by Does not apply route 3 (three) times daily before meals.  Elmore Guise Devices (LANCING DEVICE) MISC   . lisinopril (ZESTRIL) 20 MG tablet Take 1 tablet (20 mg total) by mouth daily.  . metoprolol succinate (TOPROL-XL) 50 MG 24 hr tablet Take 1 tablet (50 mg total) by mouth daily. with food  . Multiple Vitamin (MULTIVITAMIN WITH MINERALS) TABS Take 1 tablet by mouth daily.  [name] neomycin-polymyxin-hydrocortisone (CORTISPORIN) 3.5-10000-1 OTIC suspension Place 4 drops into the left ear 3 (three) times daily as needed.  [name] NOVOLIN N RELION 100 UNIT/ML injection Inject 30-60 Units into the skin 2 (two) times daily. Inject 40 units subcutaneously before breakfast and 60 units at bedtime  . TRUEplus Lancets 33G MISC USE AS DIRECTED 4 TIMES  A DAY  . vitamin E 400 UNIT capsule Take 400 Units by mouth daily.  . [DISCONTINUED] fluticasone (FLONASE) 50 MCG/ACT nasal spray Place 2 sprays into both nostrils daily. (Patient taking differently: Place 2 sprays into both nostrils daily as needed. )  . [DISCONTINUED] neomycin-polymyxin-hydrocortisone (CORTISPORIN) 3.5-10000-1 OTIC suspension Place 4 drops into the left ear 3 (three) times daily. For 1 week (Patient taking differently: Place 4 drops into the left ear 3 (three) times daily. As needed)     Allergies:   Levemir [insulin detemir] and Metformin and related   Social History   Tobacco Use  . Smoking status: Current Some Day Smoker    Packs/day: 0.20    Types: Cigarettes  . Smokeless tobacco: Never Used  . Tobacco comment: pt states she is not ready  Substance Use Topics  . Alcohol use: No  .  Drug use: No     Family Hx: The patient's family history includes Cancer in her mother and sister; Cirrhosis in her sister; Diabetes in her daughter, daughter, sister, sister, and son; Goiter in her daughter; Gout in her sister; Heart Problems in her sister; Heart attack in her brother, father, and son; Hypertension in her mother and sister; Sleep apnea in her sister; Vision loss in her mother.  ROS:   Please see the history of present illness.    All other systems reviewed and are negative.   Prior CV studies:   The following studies were reviewed today: No new since last visit  Labs/Other Tests and Data Reviewed:    EKG:  An ECG dated 10/28/19 was personally reviewed today and demonstrated:  sinus bradycardia, RBBB, PRWP, borderline LAFB  Recent Labs: 07/28/2019: ALT 20; BUN 18; Creatinine, Ser 0.92; Potassium 4.1; Sodium 136   Recent Lipid Panel Lab Results  Component Value Date/Time   CHOL 143 07/28/2019 02:50 PM   TRIG 173.0 (H) 07/28/2019 02:50 PM   HDL 30.90 (L) 07/28/2019 02:50 PM   CHOLHDL 5 07/28/2019 02:50 PM   LDLCALC 78 07/28/2019 02:50 PM   LDLDIRECT 70.0  12/13/2017 11:08 AM    Wt Readings from Last 3 Encounters:  10/28/19 175 lb (79.4 kg)  07/28/19 180 lb 6.4 oz (81.8 kg)  04/22/19 179 lb 6.4 oz (81.4 kg)     Objective:    Vital Signs:  There were no vitals taken for this visit.   Speaking comfortably on the phone, no audible wheezing In no acute distress Alert and oriented Normal affect Normal speech  ASSESSMENT & PLAN:    Hypertension: not at goal based on home numbers, ideal <130/80 -on amlodipine 10 mg, metoprolol succinate 50 mg daily -renal function 07/2019 with K 4.1, Cr 0.92 -will increase to lisinopril 40 mg daily -continue checking home blood pressures -follow up closely -recheck BMET  History of CAD with stents, mixed hyperlipidemia: no chest pain -continue aspirin 81 mg, atorvastatin 40 mg. -LDL goal <70, last chest was 78. TG 173. On atorvastatin and fenofibrate  Type II diabetes: with ASCVD history -most recent A1c 10.1 -would like to have patient on either SGLT2i or GLP1RA, defer to PCP/endo as she is on insulin  CV risk counseling and secondary prevention: -recommend heart healthy/Mediterranean diet, with whole grains, fruits, vegetable, fish, lean meats, nuts, and olive oil. Limit salt. -recommend moderate walking, 3-5 times/week for 30-50 minutes each session. Aim for at least 150 minutes.week. Goal should be pace of 3 miles/hours, or walking 1.5 miles in 30 minutes -recommend avoidance of tobacco products. Avoid excess alcohol. -Weight: BMI 30. Counseled on lifestyle/weight loss as above  COVID-19 Education: The signs and symptoms of COVID-19 were discussed with the patient and how to seek care for testing (follow up with PCP or arrange E-visit).  The importance of social distancing was discussed today.  Time:   Today, I have spent 8 minutes via phone with the patient with telehealth technology discussing the above problems.  Additional time spent in review and documentation.  Patient Instructions   Medication Instructions:  Increase Lisinopril to 40 mg daily.  *If you need a refill on your cardiac medications before your next appointment, please call your pharmacy*   Lab Work: Your physician recommends that you return for lab work in 2 weeks (BMP).    Testing/Procedures: None   Follow-Up: At [facility], you and your health needs are our priority.  As part  of our continuing mission to provide you with exceptional heart care, we have created designated Provider Care Teams.  These Care Teams include your primary Cardiologist (physician) and Advanced Practice Providers (APPs -  Physician Assistants and Nurse Practitioners) who all work together to provide you with the care you need, when you need it.  We recommend signing up for the patient portal called "MyChart".  Sign up information is provided on this After Visit Summary.  MyChart is used to connect with patients for Virtual Visits (Telemedicine).  Patients are able to view lab/test results, encounter notes, upcoming appointments, etc.  Non-urgent messages can be sent to your provider as well.   To learn more about what you can do with MyChart, go to [email or web address].    Your next appointment:   3 month(s)  The format for your next appointment:   In Person  Provider:   Buford Dresser, MD      Signed, Buford Dresser, MD  01/06/2020 1:37 PM    [place]

## 2020-01-19 ENCOUNTER — Telehealth: Payer: Self-pay

## 2020-01-19 NOTE — Telephone Encounter (Signed)
Nurse Assessment Nurse: Sherrie Mustache, RN, Lupita Leash Date/Time (Eastern Time): 01/18/2020 9:28:04 PM Confirm and document reason for call. If symptomatic, describe symptoms. ---Caller states that she is having severe pain from a root canal performed today on an upper, right tooth. Starting at approx 4:30 pm, she started having severe pain. She states she was not sent home with an RX for pain med and was told to take ibuprofen. She took Tylenol at approx 5 pm. Currently rates pain as 10/10. No bleeding from mouth or nose. Denies fever, vomiting or diarrhea. Has the patient had close contact with a person known or suspected to have the novel coronavirus illness OR traveled / lives in area with major community spread (including international travel) in the last 14 days from the onset of symptoms? * If Asymptomatic, screen for exposure and travel within the last 14 days. ---No Does the patient have any new or worsening symptoms? ---Yes Will a triage be completed? ---Yes Related visit to physician within the last 2 weeks? ---Yes Does the PT have any chronic conditions? (i.e. diabetes, asthma, this includes High risk factors for pregnancy, etc.) ---Yes List chronic conditions. ---HTN, DM, smoker, CAD with stents, depression, HLD Is this a behavioral health or substance abuse call? ---No Guidelines Guideline Title Affirmed Question Affirmed Notes Nurse Date/Time (Eastern Time) Toothache [1] SEVERE pain (e.g., excruciating, unable to do any normal activities) Sherrie Mustache, RN, Lupita Leash 01/18/2020 9:34:02 PM  Page: 2 of 2 Call Id: [id number] Guidelines Guideline Title Affirmed Question Affirmed Notes Nurse Date/Time Lamount Cohen Time) AND [2] not improved 2 hours after pain medicine Disp. Time Lamount Cohen Time) Disposition Final User 01/18/2020 9:36:12 PM See HCP within 4 Hours (or PCP triage) Yes Sherrie Mustache, RN, Lupita Leash

## 2020-01-19 NOTE — Telephone Encounter (Signed)
Attempted to contact patient to discuss pain/symptoms. VM full.

## 2020-02-02 ENCOUNTER — Other Ambulatory Visit: Payer: Self-pay | Admitting: *Deleted

## 2020-02-02 DIAGNOSIS — M5412 Radiculopathy, cervical region: Secondary | ICD-10-CM

## 2020-02-02 MED ORDER — "INSULIN SYRINGE-NEEDLE U-100 31G X 5/16"" 0.3 ML MISC": 10.0000 [IU] | Freq: Three times a day (TID) | 0 refills | Status: DC

## 2020-02-02 MED ORDER — GABAPENTIN 300 MG PO CAPS: 300.0000 mg | ORAL_CAPSULE | Freq: Two times a day (BID) | ORAL | 0 refills | Status: DC

## 2020-02-03 ENCOUNTER — Telehealth: Payer: Self-pay | Admitting: Family Medicine

## 2020-02-03 NOTE — Progress Notes (Signed)
  Chronic Care Management   Note  02/03/2020 Name: [name] MRN: [id number] DOB: [birth year]  [name] is a 74 y.o. year old female who is a primary care patient of Donato Schultz, DO. I reached out to Virgel Manifold by phone today in response to a referral sent by [name]'s PCP, [name], [name], DO.   [name] was given information about Chronic Care Management services today including:  1. CCM service includes personalized support from designated clinical staff supervised by her physician, including individualized plan of care and coordination with other care providers 2. 24/7 contact phone numbers for assistance for urgent and routine care needs. 3. Service will only be billed when office clinical staff spend 20 minutes or more in a month to coordinate care. 4. Only one practitioner may furnish and bill the service in a calendar month. 5. The patient may stop CCM services at any time (effective at the end of the month) by phone call to the office staff.   Patient agreed to services and verbal consent obtained.    This note is not being shared with the patient for the following reason: To respect privacy (The patient or proxy has requested that the information not be shared).  Follow up plan:   Raynicia Dukes [employer] Scheduler

## 2020-02-08 DIAGNOSIS — R809 Proteinuria, unspecified: Secondary | ICD-10-CM | POA: Diagnosis not present

## 2020-02-08 DIAGNOSIS — E7800 Pure hypercholesterolemia, unspecified: Secondary | ICD-10-CM | POA: Diagnosis not present

## 2020-02-08 DIAGNOSIS — E049 Nontoxic goiter, unspecified: Secondary | ICD-10-CM | POA: Diagnosis not present

## 2020-02-08 DIAGNOSIS — E1140 Type 2 diabetes mellitus with diabetic neuropathy, unspecified: Secondary | ICD-10-CM | POA: Diagnosis not present

## 2020-02-08 DIAGNOSIS — E1121 Type 2 diabetes mellitus with diabetic nephropathy: Secondary | ICD-10-CM | POA: Diagnosis not present

## 2020-02-08 DIAGNOSIS — I10 Essential (primary) hypertension: Secondary | ICD-10-CM | POA: Diagnosis not present

## 2020-02-08 DIAGNOSIS — E1165 Type 2 diabetes mellitus with hyperglycemia: Secondary | ICD-10-CM | POA: Diagnosis not present

## 2020-02-10 ENCOUNTER — Other Ambulatory Visit: Payer: Self-pay | Admitting: Endocrinology

## 2020-02-10 DIAGNOSIS — H40013 Open angle with borderline findings, low risk, bilateral: Secondary | ICD-10-CM | POA: Diagnosis not present

## 2020-02-10 DIAGNOSIS — E049 Nontoxic goiter, unspecified: Secondary | ICD-10-CM

## 2020-02-16 ENCOUNTER — Ambulatory Visit
Admission: RE | Admit: 2020-02-16 | Discharge: 2020-02-16 | Disposition: A | Discharge status: Home / Self Care | Payer: Medicare HMO | Source: Ambulatory Visit | Attending: Endocrinology | Admitting: Endocrinology

## 2020-02-16 ENCOUNTER — Encounter: Payer: Self-pay | Admitting: Cardiology

## 2020-02-16 DIAGNOSIS — E042 Nontoxic multinodular goiter: Secondary | ICD-10-CM | POA: Diagnosis not present

## 2020-02-16 DIAGNOSIS — E049 Nontoxic goiter, unspecified: Secondary | ICD-10-CM

## 2020-02-17 ENCOUNTER — Other Ambulatory Visit: Payer: Medicare HMO

## 2020-02-24 ENCOUNTER — Other Ambulatory Visit: Payer: Self-pay | Admitting: Family Medicine

## 2020-03-09 ENCOUNTER — Telehealth: Payer: Self-pay | Admitting: Family Medicine

## 2020-03-09 MED ORDER — AMLODIPINE BESYLATE 10 MG PO TABS: 10.0000 mg | ORAL_TABLET | Freq: Every day | ORAL | 0 refills | Status: DC

## 2020-03-09 NOTE — Telephone Encounter (Signed)
Medication: amLODipine (NORVASC) 10 MG tablet [[id number]   benzonatate (TESSALON) 100 MG capsule    Has the patient contacted their pharmacy? No. (If no, request that the patient contact the pharmacy for the refill.) (If yes, when and what did the pharmacy advise?)  Preferred Pharmacy (with phone number or street name):  [id number] [place], [place] - [phone] AT [facility] [address] Phone:  [phone]  Fax:  [phone]       Agent: Please be advised that RX refills may take up to 3 business days. We ask that you follow-up with your pharmacy.

## 2020-03-09 NOTE — Telephone Encounter (Signed)
Amlodipine refill sent

## 2020-03-10 ENCOUNTER — Encounter: Payer: Self-pay | Admitting: Family Medicine

## 2020-03-10 ENCOUNTER — Ambulatory Visit (INDEPENDENT_AMBULATORY_CARE_PROVIDER_SITE_OTHER): Payer: Medicare HMO | Admitting: Family Medicine

## 2020-03-10 ENCOUNTER — Other Ambulatory Visit: Payer: Self-pay

## 2020-03-10 VITALS — BP 134/68 | HR 60 | Temp 97.2°F | Resp 18 | Ht 65.0 in | Wt 176.2 lb

## 2020-03-10 DIAGNOSIS — R059 Cough, unspecified: Secondary | ICD-10-CM

## 2020-03-10 DIAGNOSIS — E785 Hyperlipidemia, unspecified: Secondary | ICD-10-CM | POA: Diagnosis not present

## 2020-03-10 DIAGNOSIS — E119 Type 2 diabetes mellitus without complications: Secondary | ICD-10-CM

## 2020-03-10 DIAGNOSIS — E118 Type 2 diabetes mellitus with unspecified complications: Secondary | ICD-10-CM

## 2020-03-10 DIAGNOSIS — E1165 Type 2 diabetes mellitus with hyperglycemia: Secondary | ICD-10-CM

## 2020-03-10 DIAGNOSIS — I10 Essential (primary) hypertension: Secondary | ICD-10-CM

## 2020-03-10 DIAGNOSIS — R05 Cough: Secondary | ICD-10-CM | POA: Diagnosis not present

## 2020-03-10 DIAGNOSIS — E1169 Type 2 diabetes mellitus with other specified complication: Secondary | ICD-10-CM | POA: Diagnosis not present

## 2020-03-10 DIAGNOSIS — IMO0002 Reserved for concepts with insufficient information to code with codable children: Secondary | ICD-10-CM

## 2020-03-10 DIAGNOSIS — J302 Other seasonal allergic rhinitis: Secondary | ICD-10-CM

## 2020-03-10 LAB — LIPID PANEL
Cholesterol: 171 mg/dL (ref 0–200)
HDL: 37 mg/dL — ABNORMAL LOW (ref >39.00)
NonHDL: 134.33
Total CHOL/HDL Ratio: 5
Triglycerides: 204 mg/dL — ABNORMAL HIGH (ref 0.0–149.0)
VLDL: 40.8 mg/dL — ABNORMAL HIGH (ref 0.0–40.0)

## 2020-03-10 LAB — COMPREHENSIVE METABOLIC PANEL
ALT: 18 U/L (ref 0–35)
AST: 18 U/L (ref 0–37)
Albumin: 4.4 g/dL (ref 3.5–5.2)
Alkaline Phosphatase: 39 U/L (ref 39–117)
BUN: 24 mg/dL — ABNORMAL HIGH (ref 6–23)
CO2: 29 mEq/L (ref 19–32)
Calcium: 10.8 mg/dL — ABNORMAL HIGH (ref 8.4–10.5)
Chloride: 101 mEq/L (ref 96–112)
Creatinine, Ser: 1.2 mg/dL (ref 0.40–1.20)
GFR: 53.11 mL/min — ABNORMAL LOW (ref >60.00)
Glucose, Bld: 138 mg/dL — ABNORMAL HIGH (ref 70–99)
Potassium: 4 mEq/L (ref 3.5–5.1)
Sodium: 138 mEq/L (ref 135–145)
Total Bilirubin: 0.7 mg/dL (ref 0.2–1.2)
Total Protein: 6.7 g/dL (ref 6.0–8.3)

## 2020-03-10 LAB — HEMOGLOBIN A1C: Hgb A1c MFr Bld: 9.4 % — ABNORMAL HIGH (ref 4.6–6.5)

## 2020-03-10 LAB — LDL CHOLESTEROL, DIRECT: Direct LDL: 100 mg/dL

## 2020-03-10 MED ORDER — BENZONATATE 100 MG PO CAPS: 100.0000 mg | ORAL_CAPSULE | Freq: Three times a day (TID) | ORAL | 0 refills | Status: DC | PRN

## 2020-03-10 MED ORDER — AMLODIPINE BESYLATE 10 MG PO TABS: 10.0000 mg | ORAL_TABLET | Freq: Every day | ORAL | 1 refills | Status: DC

## 2020-03-10 NOTE — Assessment & Plan Note (Signed)
From allergies con't allegra Refill cough med

## 2020-03-10 NOTE — Assessment & Plan Note (Signed)
con't allegra and cough med

## 2020-03-10 NOTE — Progress Notes (Signed)
Patient ID: [name], female    DOB: 1946-08-16  Age: 74 y.o. MRN: [id number]    Subjective:  Subjective  HPI [name] presents for f/u dm, chol and bp  HYPERTENSION   Blood pressure range-not checking   Chest pain- no      Dyspnea- no Lightheadedness- no   Edema- no  Other side effects - no   Medication compliance: good Low salt diet- yes     DIABETES    Blood Sugar ranges-130-220  Polyuria- no New Visual problems- no  Hypoglycemic symptoms- no  Other side effects-no Medication compliance - good Last eye exam- April 2021 Foot exam- today   HYPERLIPIDEMIA  Medication compliance- no RUQ pain- no  Muscle aches- no Other side effects-no       Review of Systems  Constitutional: Negative for appetite change, diaphoresis, fatigue and unexpected weight change.  Eyes: Negative for pain, redness and visual disturbance.  Respiratory: Negative for cough, chest tightness, shortness of breath and wheezing.   Cardiovascular: Negative for chest pain, palpitations and leg swelling.  Endocrine: Negative for cold intolerance, heat intolerance, polydipsia, polyphagia and polyuria.  Genitourinary: Negative for difficulty urinating, dysuria and frequency.  Neurological: Negative for dizziness, light-headedness, numbness and headaches.    History Past Medical History:  Diagnosis Date  . Blockage of coronary artery of heart ([place])   . Depression   . Diabetes mellitus   . Hyperlipidemia   . Hypertension   . Pancreatitis   . Restless leg syndrome     She has a past surgical history that includes Tubal ligation; Rotator cuff repair; Coronary angioplasty with stent; Foot surgery (Right); Cataract extraction, bilateral; and Rotator cuff repair (Left).   Her family history includes Cancer in her mother and sister; Cirrhosis in her sister; Diabetes in her daughter, daughter, sister, sister, and son; Goiter in her daughter; Gout in her sister; Heart Problems in her  sister; Heart attack in her brother, father, and son; Hypertension in her mother and sister; Sleep apnea in her sister; Vision loss in her mother.She reports that she has been smoking cigarettes. She has been smoking about 0.20 packs per day. She has never used smokeless tobacco. She reports that she does not drink alcohol or use drugs.  Current Outpatient Medications on File Prior to Visit  Medication Sig Dispense Refill  . Alcohol Swabs (B-D SINGLE USE SWABS REGULAR) PADS USE AS DIRECTED 400 each 1  . alendronate (FOSAMAX) 70 MG tablet TAKE 1 TABLET BY MOUTH EVERY WEEK WITH FULL GLASS OF WATER ON AN EMPTY STOMACH 12 tablet 1  . ALPRAZolam (XANAX) 0.25 MG tablet Take 1 tablet by mouth three times a day if needed 60 tablet 0  . aspirin EC 81 MG tablet Take 81 mg by mouth daily.    [name] atorvastatin (LIPITOR) 40 MG tablet TAKE 1 TABLET(40 MG) BY MOUTH DAILY 90 tablet 1  . Blood Glucose Calibration (TAI DOC CONTROL) NORMAL SOLN     . Blood Glucose Monitoring Suppl (TRUE METRIX AIR GLUCOSE METER) w/Device KIT 1 each by Does not apply route 4 (four) times daily. DX CODE E11.8 1 kit 0  . Carboxymethylcellulose Sodium (THERATEARS OP) Place 1 drop into both eyes daily as needed (dry eyes).    . fenofibrate 160 MG tablet Take 1 tablet (160 mg total) by mouth daily. 90 tablet 1  . ferrous sulfate 325 (65 FE) MG tablet Take 325 mg by mouth daily with breakfast.    . Flaxseed, Linseed, (  FLAXSEED OIL PO) Take 1 capsule by mouth 2 (two) times daily.     [name] FLUoxetine (PROZAC) 20 MG capsule TAKE 1 CAPSULE BY MOUTH EVERY DAY 90 capsule 1  . fluticasone (FLONASE) 50 MCG/ACT nasal spray Place 2 sprays into both nostrils daily as needed for allergies or rhinitis.    [name] gabapentin (NEURONTIN) 300 MG capsule Take 1 capsule (300 mg total) by mouth 2 (two) times daily. If tolerating may take 3 times per day if needed. 90 capsule 0  . glucose blood (TRUE METRIX BLOOD GLUCOSE TEST) test strip USE AS DIRECTED 4 TIMES A DAY 400  strip 1  . insulin regular (NOVOLIN R RELION) 100 units/mL injection Inject 0.3 mLs (30 Units total) into the skin daily as needed for high blood sugar (at noon if CBG >200 and will be eating). 10 mL   . Insulin Syringe-Needle U-100 (BD INSULIN SYRINGE U/F) 31G X 5/16" 0.3 ML MISC 10 Units by Does not apply route 3 (three) times daily before meals. 100 each 0  . Lancet Devices (LANCING DEVICE) MISC     . lisinopril (ZESTRIL) 40 MG tablet Take 1 tablet (40 mg total) by mouth daily. 90 tablet 3  . metoprolol succinate (TOPROL-XL) 50 MG 24 hr tablet Take 1 tablet (50 mg total) by mouth daily. with food 90 tablet 0  . Multiple Vitamin (MULTIVITAMIN WITH MINERALS) TABS Take 1 tablet by mouth daily.    [name] neomycin-polymyxin-hydrocortisone (CORTISPORIN) 3.5-10000-1 OTIC suspension Place 4 drops into the left ear 3 (three) times daily as needed.    [name] NOVOLIN N RELION 100 UNIT/ML injection Inject 30-60 Units into the skin 2 (two) times daily. Inject 40 units subcutaneously before breakfast and 60 units at bedtime    . TRUEplus Lancets 33G MISC USE AS DIRECTED 4 TIMES A DAY 400 each 1  . vitamin E 400 UNIT capsule Take 400 Units by mouth daily.     No current facility-administered medications on file prior to visit.     Objective:  Objective  Physical Exam Vitals and nursing note reviewed.  Constitutional:      Appearance: She is well-developed.  HENT:     Head: Normocephalic and atraumatic.  Eyes:     Conjunctiva/sclera: Conjunctivae normal.  Neck:     Thyroid: No thyromegaly.     Vascular: No carotid bruit or JVD.  Cardiovascular:     Rate and Rhythm: Normal rate and regular rhythm.     Heart sounds: Normal heart sounds. No murmur.  Pulmonary:     Effort: Pulmonary effort is normal. No respiratory distress.     Breath sounds: Normal breath sounds. No wheezing or rales.  Chest:     Chest wall: No tenderness.  Musculoskeletal:     Cervical back: Normal range of motion and neck supple.    Neurological:     Mental Status: She is alert and oriented to person, place, and time.    Diabetic Foot Exam - Simple   Simple Foot Form Diabetic Foot exam was performed with the following findings: Yes 03/10/2020  2:04 PM  Visual Inspection No deformities, no ulcerations, no other skin breakdown bilaterally: Yes Sensation Testing Intact to touch and monofilament testing bilaterally: Yes Pulse Check Posterior Tibialis and Dorsalis pulse intact bilaterally: Yes Comments      BP 134/68 (BP Location: Right Arm, Patient Position: Sitting, Cuff Size: Large)   Pulse 60   Temp (!) 97.2 F (36.2 C) (Temporal)   Resp 18  Ht '5\' 5"'  (1.651 m)   Wt 176 lb 3.2 oz (79.9 kg)   SpO2 100%   BMI 29.32 kg/m  Wt Readings from Last 3 Encounters:  03/10/20 176 lb 3.2 oz (79.9 kg)  10/28/19 175 lb (79.4 kg)  07/28/19 180 lb 6.4 oz (81.8 kg)     Lab Results  Component Value Date   WBC 11.2 (H) 08/11/2018   HGB 12.3 08/11/2018   HCT 36.9 08/11/2018   PLT 221.0 08/11/2018   GLUCOSE 323 (H) 07/28/2019   CHOL 143 07/28/2019   TRIG 173.0 (H) 07/28/2019   HDL 30.90 (L) 07/28/2019   LDLDIRECT 70.0 12/13/2017   LDLCALC 78 07/28/2019   ALT 20 07/28/2019   AST 18 07/28/2019   NA 136 07/28/2019   K 4.1 07/28/2019   CL 100 07/28/2019   CREATININE 0.92 07/28/2019   BUN 18 07/28/2019   CO2 29 07/28/2019   TSH 0.58 08/11/2018   INR 1.1 03/03/2009   HGBA1C 10.1 (H) 07/28/2019   MICROALBUR 27.3 (H) 07/28/2019    US THYROID  Result Date: 02/16/2020 CLINICAL DATA:  Nontoxic goiter. Previous FNA biopsy inferior left and inferior right nodules 03/14/2009 EXAM: THYROID ULTRASOUND TECHNIQUE: Ultrasound examination of the thyroid gland and adjacent soft tissues was performed. COMPARISON:  08/11/2018 and previous FINDINGS: Parenchymal Echotexture: Markedly heterogenous Isthmus: 1 cm thickness, previously 1.2 Right lobe: 7.6 x 3.3 x 3.8 cm, previously 7.3 x 3.3 x 3.4 Left lobe: 9 x 4.1 x 4.9 cm,  previously 10 x 4.7 x 5 _________________________________________________________ Estimated total number of nodules >/= 1 cm: 2 Number of spongiform nodules >/=  2 cm not described below (TR1): 0 Number of mixed cystic and solid nodules >/= 1.5 cm not described below ([place]): 0 _________________________________________________________ Nodule # 1: Prior biopsy: No Location: Isthmus; Maximum size: 1.7 cm; Other 2 dimensions: 1.3 x 1.3 cm, previously, 1.4 x 1.1 x 1.1 cm Composition: solid/almost completely solid (2) Echogenicity: isoechoic (1) Shape: not taller-than-wide (0) Margins: smooth (0) Echogenic foci: none (0) ACR TI-RADS total points: 3. ACR TI-RADS risk category:  TR3 (3 points). Significant change in size (>/= 20% in two dimensions and minimal increase of 2 mm): Yes Change in features: No Change in ACR TI-RADS risk category: No ACR TI-RADS recommendations: *Given size (>/= 1.5 - 2.4 cm) and appearance, a follow-up ultrasound in 1 year should be considered based on TI-RADS criteria. _________________________________________________________ Nodule # 2: 4.4 x 2.6 x 3.6 cm inferior right, previously 4.7 x 3 x 3.4; this was previously biopsied Background parenchyma is diffusely nodular without any other discrete well-marginated mass. 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 No current indication for biopsy or dedicated imaging follow-up. The above is in keeping with the ACR TI-RADS recommendations - J Am Coll Radiol 2017;14:587-595. Electronically Signed   By: Lucrezia Europe M.D.   On: 02/16/2020 15:44     Assessment & Plan:  Plan  I have changed Sahaana C. Spurgin's amLODipine. I am also having her maintain her NovoLIN N ReliOn, multivitamin with minerals, Lancing Device, Tai Doc Control, aspirin EC, (Flaxseed, Linseed, (FLAXSEED OIL PO)), ferrous sulfate, vitamin E, Carboxymethylcellulose Sodium (THERATEARS OP), insulin regular, atorvastatin, True Metrix Air Glucose Meter, FLUoxetine,  True Metrix Blood Glucose Test, B-D SINGLE USE SWABS REGULAR, TRUEplus Lancets 33G, fenofibrate, ALPRAZolam, metoprolol succinate, fluticasone, neomycin-polymyxin-hydrocortisone, lisinopril, gabapentin, Insulin Syringe-Needle U-100, alendronate, and benzonatate.  Meds ordered this encounter  Medications  . amLODipine (NORVASC) 10 MG tablet    Sig: Take 1 tablet (10  mg total) by mouth daily.    Dispense:  90 tablet    Refill:  1  . benzonatate (TESSALON) 100 MG capsule    Sig: Take 1 capsule (100 mg total) by mouth 3 (three) times daily as needed for cough.    Dispense:  30 capsule    Refill:  0    Problem List Items Addressed This Visit      Unprioritized   Cough    From allergies con't allegra Refill cough med      Relevant Medications   benzonatate (TESSALON) 100 MG capsule   Diabetes mellitus type II, uncontrolled ([place])    hgba1c to be done , minimize simple carbs. Increase exercise as tolerated. Continue current meds       RESOLVED: Diet-controlled diabetes mellitus ([place])   Relevant Orders   Lipid panel   Hemoglobin A1c   Comprehensive metabolic panel   Essential hypertension - Primary    Well controlled, no changes to meds. Encouraged heart healthy diet such as the DASH diet and exercise as tolerated.       Relevant Medications   amLODipine (NORVASC) 10 MG tablet   Other Relevant Orders   Lipid panel   Hemoglobin A1c   Comprehensive metabolic panel   Hyperlipidemia associated with type 2 diabetes mellitus ([place])    Tolerating statin, encouraged heart healthy diet, avoid trans fats, minimize simple carbs and saturated fats. Increase exercise as tolerated      Relevant Orders   Lipid panel   Comprehensive metabolic panel   Seasonal allergies    con't allegra and cough med      Uncontrolled type 2 diabetes mellitus with complication ([place])      Follow-up: Return in about 6 months (around 09/09/2020), or if symptoms worsen or fail to improve, for annual exam,  fasting.  Ann Held, DO

## 2020-03-10 NOTE — Assessment & Plan Note (Signed)
hgba1c to be done, minimize simple carbs. Increase exercise as tolerated. Continue current meds  

## 2020-03-10 NOTE — Patient Instructions (Signed)
Carbohydrate Counting for Diabetes Mellitus, Adult  Carbohydrate counting is a method of keeping track of how many carbohydrates you eat. Eating carbohydrates naturally increases the amount of sugar (glucose) in the blood. Counting how many carbohydrates you eat helps keep your blood glucose within normal limits, which helps you manage your diabetes (diabetes mellitus). It is important to know how many carbohydrates you can safely have in each meal. This is different for every person. A diet and nutrition specialist (registered dietitian) can help you make a meal plan and calculate how many carbohydrates you should have at each meal and snack. Carbohydrates are found in the following foods:  Grains, such as breads and cereals.  Dried beans and soy products.  Starchy vegetables, such as potatoes, peas, and corn.  Fruit and fruit juices.  Milk and yogurt.  Sweets and snack foods, such as cake, cookies, candy, chips, and soft drinks. How do I count carbohydrates? There are two ways to count carbohydrates in food. You can use either of the methods or a combination of both. Reading "Nutrition Facts" on packaged food The "Nutrition Facts" list is included on the labels of almost all packaged foods and beverages in the [place] It includes:  The serving size.  Information about nutrients in each serving, including the grams (g) of carbohydrate per serving. To use the "Nutrition Facts":  Decide how many servings you will have.  Multiply the number of servings by the number of carbohydrates per serving.  The resulting number is the total amount of carbohydrates that you will be having. Learning standard serving sizes of other foods When you eat carbohydrate foods that are not packaged or do not include "Nutrition Facts" on the label, you need to measure the servings in order to count the amount of carbohydrates:  Measure the foods that you will eat with a food scale or measuring cup, if  needed.  Decide how many standard-size servings you will eat.  Multiply the number of servings by 15. Most carbohydrate-rich foods have about 15 g of carbohydrates per serving. ? For example, if you eat 8 oz (170 g) of strawberries, you will have eaten 2 servings and 30 g of carbohydrates (2 servings x 15 g = 30 g).  For foods that have more than one food mixed, such as soups and casseroles, you must count the carbohydrates in each food that is included. The following list contains standard serving sizes of common carbohydrate-rich foods. Each of these servings has about 15 g of carbohydrates:   hamburger bun or  English muffin.   oz (15 mL) syrup.   oz (14 g) jelly.  1 slice of bread.  1 six-inch tortilla.  3 oz (85 g) cooked rice or pasta.  4 oz (113 g) cooked dried beans.  4 oz (113 g) starchy vegetable, such as peas, corn, or potatoes.  4 oz (113 g) hot cereal.  4 oz (113 g) mashed potatoes or  of a large baked potato.  4 oz (113 g) canned or frozen fruit.  4 oz (120 mL) fruit juice.  4-6 crackers.  6 chicken nuggets.  6 oz (170 g) unsweetened dry cereal.  6 oz (170 g) plain fat-free yogurt or yogurt sweetened with artificial sweeteners.  8 oz (240 mL) milk.  8 oz (170 g) fresh fruit or one small piece of fruit.  24 oz (680 g) popped popcorn. Example of carbohydrate counting Sample meal  3 oz (85 g) chicken breast.  6 oz (170 g)   brown rice.  4 oz (113 g) corn.  8 oz (240 mL) milk.  8 oz (170 g) strawberries with sugar-free whipped topping. Carbohydrate calculation 1. Identify the foods that contain carbohydrates: ? Rice. ? Corn. ? Milk. ? Strawberries. 2. Calculate how many servings you have of each food: ? 2 servings rice. ? 1 serving corn. ? 1 serving milk. ? 1 serving strawberries. 3. Multiply each number of servings by 15 g: ? 2 servings rice x 15 g = 30 g. ? 1 serving corn x 15 g = 15 g. ? 1 serving milk x 15 g = 15 g. ? 1  serving strawberries x 15 g = 15 g. 4. Add together all of the amounts to find the total grams of carbohydrates eaten: ? 30 g + 15 g + 15 g + 15 g = 75 g of carbohydrates total. Summary  Carbohydrate counting is a method of keeping track of how many carbohydrates you eat.  Eating carbohydrates naturally increases the amount of sugar (glucose) in the blood.  Counting how many carbohydrates you eat helps keep your blood glucose within normal limits, which helps you manage your diabetes.  A diet and nutrition specialist (registered dietitian) can help you make a meal plan and calculate how many carbohydrates you should have at each meal and snack. This information is not intended to replace advice given to you by your health care provider. Make sure you discuss any questions you have with your health care provider. Document Revised: 04/18/2017 Document Reviewed: 03/07/2016 Elsevier Patient Education  2020 Elsevier Inc.  

## 2020-03-10 NOTE — Assessment & Plan Note (Signed)
Tolerating statin, encouraged heart healthy diet, avoid trans fats, minimize simple carbs and saturated fats. Increase exercise as tolerated 

## 2020-03-10 NOTE — Assessment & Plan Note (Signed)
Well controlled, no changes to meds. Encouraged heart healthy diet such as the DASH diet and exercise as tolerated.  °

## 2020-03-12 ENCOUNTER — Other Ambulatory Visit: Payer: Self-pay | Admitting: Family Medicine

## 2020-03-12 DIAGNOSIS — E785 Hyperlipidemia, unspecified: Secondary | ICD-10-CM

## 2020-03-12 DIAGNOSIS — E1165 Type 2 diabetes mellitus with hyperglycemia: Secondary | ICD-10-CM

## 2020-03-12 DIAGNOSIS — E1169 Type 2 diabetes mellitus with other specified complication: Secondary | ICD-10-CM

## 2020-03-15 ENCOUNTER — Ambulatory Visit: Payer: Medicare HMO | Admitting: Cardiology

## 2020-03-15 MED ORDER — ROSUVASTATIN CALCIUM 20 MG PO TABS: 20.0000 mg | ORAL_TABLET | Freq: Every day | ORAL | 2 refills | Status: DC

## 2020-03-15 NOTE — Addendum Note (Signed)
Addended by: Steve Rattler A on: 03/15/2020 09:43 AM   Modules accepted: Orders

## 2020-03-16 ENCOUNTER — Telehealth: Payer: Self-pay | Admitting: Family Medicine

## 2020-03-16 ENCOUNTER — Other Ambulatory Visit: Payer: Self-pay

## 2020-03-16 DIAGNOSIS — E1169 Type 2 diabetes mellitus with other specified complication: Secondary | ICD-10-CM

## 2020-03-16 DIAGNOSIS — E785 Hyperlipidemia, unspecified: Secondary | ICD-10-CM

## 2020-03-16 DIAGNOSIS — E1165 Type 2 diabetes mellitus with hyperglycemia: Secondary | ICD-10-CM

## 2020-03-16 DIAGNOSIS — I10 Essential (primary) hypertension: Secondary | ICD-10-CM

## 2020-03-16 MED ORDER — METOPROLOL SUCCINATE ER 50 MG PO TB24: 50.0000 mg | ORAL_TABLET | Freq: Every day | ORAL | 0 refills | Status: DC

## 2020-03-16 NOTE — Telephone Encounter (Signed)
Refill sent.

## 2020-03-16 NOTE — Telephone Encounter (Signed)
Medication: metoprolol succinate (TOPROL-XL) 50 MG 24 hr tablet  Patient states she called in on Monday.. No message shows. She states she has been out since Sunday.   Has the patient contacted their pharmacy? Yes.   (If no, request that the patient contact the pharmacy for the refill.) (If yes, when and what did the pharmacy advise?) They said they also sent request over   Preferred Pharmacy (with phone number or street name): [id number] [place], [place] - [phone] AT [facility] [address]  [address] [place] [address]  Phone:  [phone] Fax:  [phone]    Please be advised that RX refills may take up to 3 business days. We ask that you follow-up with your pharmacy.

## 2020-03-18 ENCOUNTER — Telehealth: Payer: Medicare HMO

## 2020-03-18 NOTE — Chronic Care Management (AMB) (Deleted)
Chronic Care Management Pharmacy  Name: ARIONNE IAMS  MRN: [id number] DOB: [birth year]  Chief Complaint/ HPI  [name],  74 y.o. , female presents for their Initial CCM visit with the clinical pharmacist via telephone due to COVID-19 Pandemic.  PCP : Ann Held, DO  Their chronic conditions include: {CHL AMB CHRONIC MEDICAL CONDITIONS:[phone]}  Office Visits: 03/10/20: Visit w/ Dr. Etter Sjogren - Med compliance listed as "no". Lipitor changed to crestor 76m. Labs faxed to Dr. BChalmers Cater(endo) for uncontrolled diabetes. Labs ordered (cmp, a1c, lipid, ldl direct). RTC 6 months.   Consult Visit: 01/06/20: Cardio visit w/ Dr. CHarrell Gave- Increased lisinopril to 42mdaily. Would like to have patient on SGLT2 or GLP1.   12/02/19: Cardio visit w/ Dr. ChHarrell Gave High BP at home. Lowest 146/66. Increase lisinopril to 2031maily.   10/28/19: Cardio visit w/ Dr. ChrHarrell GaveNo med changes noted.   Medications: Outpatient Encounter Medications as of 03/23/2020  Medication Sig  . Alcohol Swabs (B-D SINGLE USE SWABS REGULAR) PADS USE AS DIRECTED  . alendronate (FOSAMAX) 70 MG tablet TAKE 1 TABLET BY MOUTH EVERY WEEK WITH FULL GLASS OF WATER ON AN EMPTY STOMACH  . ALPRAZolam (XANAX) 0.25 MG tablet Take 1 tablet by mouth three times a Lynell Greenhouse if needed  . amLODipine (NORVASC) 10 MG tablet Take 1 tablet (10 mg total) by mouth daily.  . aMarland Kitchenpirin EC 81 MG tablet Take 81 mg by mouth daily.  . aMarland Kitchenorvastatin (LIPITOR) 40 MG tablet TAKE 1 TABLET(40 MG) BY MOUTH DAILY  . benzonatate (TESSALON) 100 MG capsule Take 1 capsule (100 mg total) by mouth 3 (three) times daily as needed for cough.  . Blood Glucose Calibration (TAI DOC CONTROL) NORMAL SOLN   . Blood Glucose Monitoring Suppl (TRUE METRIX AIR GLUCOSE METER) w/Device KIT 1 each by Does not apply route 4 (four) times daily. DX CODE E11.8  . Carboxymethylcellulose Sodium (THERATEARS OP) Place 1 drop into both eyes daily as needed (dry  eyes).  . fenofibrate 160 MG tablet Take 1 tablet (160 mg total) by mouth daily.  . ferrous sulfate 325 (65 FE) MG tablet Take 325 mg by mouth daily with breakfast.  . Flaxseed, Linseed, (FLAXSEED OIL PO) Take 1 capsule by mouth 2 (two) times daily.   . FMarland KitchenUoxetine (PROZAC) 20 MG capsule TAKE 1 CAPSULE EVERY [facility]  . fluticasone (FLONASE) 50 MCG/ACT nasal spray Place 2 sprays into both nostrils daily as needed for allergies or rhinitis.  . gMarland Kitchenbapentin (NEURONTIN) 300 MG capsule Take 1 capsule (300 mg total) by mouth 2 (two) times daily. If tolerating may take 3 times per Akiba Melfi if needed.  . gMarland Kitchenucose blood (TRUE METRIX BLOOD GLUCOSE TEST) test strip USE AS DIRECTED 4 TIMES A Jassen Sarver  . insulin regular (NOVOLIN R RELION) 100 units/mL injection Inject 0.3 mLs (30 Units total) into the skin daily as needed for high blood sugar (at noon if CBG >200 and will be eating).  . Insulin Syringe-Needle U-100 (BD INSULIN SYRINGE U/F) 31G X 5/16" 0.3 ML MISC 10 Units by Does not apply route 3 (three) times daily before meals.  . LElmore Guisevices (LANCING DEVICE) MISC   . lisinopril (ZESTRIL) 40 MG tablet Take 1 tablet (40 mg total) by mouth daily.  . metoprolol succinate (TOPROL-XL) 50 MG 24 hr tablet Take 1 tablet (50 mg total) by mouth daily. with food  . Multiple Vitamin (MULTIVITAMIN WITH MINERALS) TABS Take 1 tablet by mouth daily.  . nMarland Kitchenomycin-polymyxin-hydrocortisone (  CORTISPORIN) 3.5-10000-1 OTIC suspension Place 4 drops into the left ear 3 (three) times daily as needed.  [name] NOVOLIN N RELION 100 UNIT/ML injection Inject 30-60 Units into the skin 2 (two) times daily. Inject 40 units subcutaneously before breakfast and 60 units at bedtime  . rosuvastatin (CRESTOR) 20 MG tablet Take 1 tablet (20 mg total) by mouth daily.  . TRUEplus Lancets 33G MISC USE AS DIRECTED 4 TIMES A Nycholas Rayner  . vitamin E 400 UNIT capsule Take 400 Units by mouth daily.   No facility-administered encounter medications on file as of 03/23/2020.      Current Diagnosis/Assessment:  Goals Addressed   None     Diabetes   Recent Relevant Labs: Lab Results  Component Value Date/Time   HGBA1C 9.4 (H) 03/10/2020 02:10 PM   HGBA1C 10.1 (H) 07/28/2019 02:50 PM   MICROALBUR 27.3 (H) 07/28/2019 02:50 PM   MICROALBUR 1.7 06/23/2015 10:21 AM    A1c goal <7%  Checking BG: {CHL HP Blood Glucose Monitoring Frequency:[phone]}  Recent FBG Readings: Recent pre-meal BG readings: *** Recent 2hr PP BG readings:  *** Recent HS BG readings: *** Patient has failed these meds in past: Levemir (hives, swelling), metformin (GI, sweaty, pain) Patient is currently uncontrolled on the following medications: Novolin R 30 units if BG >200, Novolin N 40 units before breakfast, 60 units at bedtime  Last diabetic Eye exam: April 2021 Lab Results  Component Value Date/Time   HMDIABEYEEXA Cataracts 07/08/2013 12:00 AM    Last diabetic Foot exam: 03/10/20 by Dr. Etter Sjogren  Followed by Dr. Chalmers Cater?  We discussed: {CHL HP Upstream Pharmacy [phone]}  Plan  Continue {CHL HP Upstream Pharmacy [phone]}    Hypertension   CMP Latest Ref Rng & Units 03/10/2020 07/28/2019 10/21/2018  Glucose 70 - 99 mg/dL 138(H) 323(H) -  BUN 6 - 23 mg/dL 24(H) 18 -  Creatinine 0.40 - 1.20 mg/dL 1.20 0.92 -  Sodium 135 - 145 mEq/L 138 136 -  Potassium 3.5 - 5.1 mEq/L 4.0 4.1 -  Chloride 96 - 112 mEq/L 101 100 -  CO2 19 - 32 mEq/L 29 29 -  Calcium 8.4 - 10.5 mg/dL 10.8(H) 9.4 -  Total Protein 6.0 - 8.3 g/dL 6.7 6.5 6.7  Total Bilirubin 0.2 - 1.2 mg/dL 0.7 0.8 -  Alkaline Phos 39 - 117 U/L 39 62 -  AST 0 - 37 U/L 18 18 -  ALT 0 - 35 U/L 18 20 -   Kidney Function Lab Results  Component Value Date/Time   CREATININE 1.20 03/10/2020 02:10 PM   CREATININE 0.92 07/28/2019 02:50 PM   GFR 53.11 (L) 03/10/2020 02:10 PM   GFRNONAA >60 02/18/2016 10:01 AM   GFRAA >60 02/18/2016 10:01 AM   BP today is:  {CHL HP UPSTREAM Pharmacist BP  [phone]}  Office blood pressures are  BP Readings from Last 3 Encounters:  03/10/20 134/68  12/02/19 (!) 150/75  10/28/19 (!) 148/66   Blood pressure goal <130/80  Patient has failed these meds in the past: None noted  Patient is currently {CHL Controlled/Uncontrolled:[phone]} on the following medications: Lisinopril 37m daily, amlodipine 185mdaily, metoprolol succinate 5058maily  Patient checks BP at home {CHL HP BP Monitoring [phone]}  Patient home BP readings are ranging: ***  We discussed {CHL HP Upstream Pharmacy [phone]}  Plan  Continue {CHL HP Upstream Pharmacy [phone]}   Hyperlipidemia/CAD   Lipid Panel     Component Value Date/Time   CHOL 171 03/10/2020 1410   TRIG  204.0 (H) 03/10/2020 1410   HDL 37.00 (L) 03/10/2020 1410   LDLCALC 78 07/28/2019 1450   LDLDIRECT 100.0 03/10/2020 1410    LDL goal <70  The 10-year ASCVD risk score Mikey Bussing DC Jr., et al., 2013) is: 45.8%   Values used to calculate the score:     Age: 45 years     Sex: Female     Is Non-Hispanic African American: Yes     Diabetic: Yes     Tobacco smoker: Yes     Systolic Blood Pressure: 680 mmHg     Is BP treated: Yes     HDL Cholesterol: 37 mg/dL     Total Cholesterol: 171 mg/dL   Patient has failed these meds in past: atorvastatin was switched to rosuvastatin Patient is currently uncontrolled on the following medications: Rosuvastatin 32m daily, fenofibrate 1667mdaily, flaxseed oil twice daily, aspirin 8135maily  Still taking atorvastatin?  We discussed:  {CHL HP Upstream Pharmacy discussion:[phone]}  Plan  Continue {CHL HP Upstream Pharmacy PlaSUPJS:[id number]}Tobacco Use Disorder   Eosinophil count:   Lab Results  Component Value Date/Time   EOSPCT 1.2 08/11/2018 02:34 PM  %                               Eos (Absolute):  Lab Results  Component Value Date/Time   EOSABS 0.1 08/11/2018 02:34 PM    Tobacco Status:   Social History   Tobacco Use  Smoking Status Current Some Auren Valdes Smoker  . Packs/Khayla Koppenhaver: 0.20  . Types: Cigarettes  Smokeless Tobacco Never Used  Tobacco Comment   pt states she is not ready    Patient has failed these meds in past: *** Patient is currently {CHL Controlled/Uncontrolled:[phone]} on the following medications: *** Using maintenance inhaler regularly? {yes/no:20286} Frequency of rescue inhaler use:  {CHL HP Upstream Pharm Inhaler FreTWKM:[id number]}e discussed:  {CHL HP Upstream Pharmacy discussion:[phone]}  Plan  Continue {CHL HP Upstream Pharmacy Plans:[phone]}   Osteoporosis   Last DEXA Scan: 05/29/2017  T-Score femoral neck: Left -2.0  T-Score forearm radius: -2.5  No results found for: VD25OH   Patient is a candidate for pharmacologic treatment due to {osteoporosis criteria:23884}  Patient has failed these meds in past: None noted  Patient is currently {CHL Controlled/Uncontrolled:[phone]} on the following medications: Alendronate 76m60mekly  We discussed:  {Osteoporosis Counseling:23892}  Plan -Consider repeat DEXA Scan Continue {CHL HP Upstream Pharmacy Plans:[phone]}   Neuropathy    Patient has failed these meds in past: None noted  Patient is currently {CHL Controlled/Uncontrolled:[phone]} on the following medications: Gabapentin 300mg71m to three times daily  We discussed:  {CHL HP Upstream Pharmacy discussion:[phone]}  Plan  Continue {CHL HP Upstream Pharmacy PlansHAFBX:[phone]}ression/Anxiety    Patient has failed these meds in past: Citalopram (listed in D/C meds. No apparent reason for D/C), sertraline (palpitations) Patient is currently {CHL Controlled/Uncontrolled:[phone]} on the following medications: Fluoxetine 20mg 43my, lprazolam 0.25mg t77m times daily as needed  We discussed:  {CHL HP Upstream Pharmacy discussion:[phone]}  Plan  Continue {CHL HP Upstream Pharmacy Plans:[phone]}    Allergic Rhinitis    Patient has failed these meds in past: None noted  Patient is currently {CHL Controlled/Uncontrolled:[phone]} on the following medications: Fluticasone 50mcg/a32m sprays into both nostrils as needed  We discussed:  {CHL HP Upstream Pharmacy discussion:[phone]}  Plan  Continue {CHL HP Upstream Pharmacy Plans:[id number]}llaneous Meds  Benzonatate Multivitamin  Vitamin E  Meds to D/C from list Atorvastatin (change in therapy)

## 2020-03-23 ENCOUNTER — Ambulatory Visit: Payer: Self-pay | Admitting: Pharmacist

## 2020-03-23 ENCOUNTER — Telehealth: Payer: Medicare HMO

## 2020-03-23 NOTE — Chronic Care Management (AMB) (Signed)
  Chronic Care Management   Outreach Note  03/23/2020 Name: [name] MRN: [id number] DOB: 20-Oct-1945  Referred by: Donato Schultz, DO Reason for referral : No chief complaint on file.   A second unsuccessful telephone outreach was attempted today. The patient was referred to pharmacist for assistance with care management and care coordination.   Called at 10:05am and 10:13am with no answer or ability to leave voice mail.  Follow Up Plan:  -Will try to reschedule patient before the end of the month or next month  Katrinka Blazing, PharmD Clinical Pharmacist [facility] [phone]

## 2020-03-29 DIAGNOSIS — E1165 Type 2 diabetes mellitus with hyperglycemia: Secondary | ICD-10-CM | POA: Diagnosis not present

## 2020-03-30 ENCOUNTER — Telehealth: Payer: Self-pay | Admitting: Family Medicine

## 2020-03-30 NOTE — Progress Notes (Signed)
  Chronic Care Management   Outreach Note  03/30/2020 Name: [name] MRN: [id number] DOB: 07-May-1946  Referred by: Donato Schultz, DO Reason for referral : No chief complaint on file.   An unsuccessful telephone outreach was attempted today. The patient was referred to the pharmacist for assistance with care management and care coordination. This note is not being shared with the patient for the following reason: To respect privacy (The patient or proxy has requested that the information not be shared).  Follow Up Plan:   Lynnae January Upstream Scheduler

## 2020-05-11 NOTE — Progress Notes (Signed)
I connected with [name] today by telephone and verified that I am speaking with the correct person using two identifiers. Location patient: home Location provider: work Persons participating in the virtual visit: patient, Marine scientist.    I discussed the limitations, risks, security and privacy concerns of performing an evaluation and management service by telephone and the availability of in person appointments. I also discussed with the patient that there may be a patient responsible charge related to this service. The patient expressed understanding and verbally consented to this telephonic visit.    Interactive audio and video telecommunications were attempted between this provider and patient, however failed, due to patient having technical difficulties OR patient did not have access to video capability.  We continued and completed visit with audio only.  Some vital signs may be absent or patient reported.      Subjective:   [name] is a 74 y.o. female who presents for Medicare Annual (Subsequent) preventive examination.  Review of Systems     Cardiac Risk Factors include: advanced age (>54mn, >>36women);dyslipidemia;diabetes mellitus     Objective:     Advanced Directives 05/12/2020 05/11/2019 04/22/2019 10/14/2017 04/01/2017 08/01/2016 09/29/2015  Does Patient Have a Medical Advance Directive? No No No No No No No  Would patient like information on creating a medical advance directive? No - Patient declined No - Patient declined - - Yes (MAU/Ambulatory/Procedural Areas - Information given) No - patient declined information Yes - Educational materials given    Current Medications (verified) Outpatient Encounter Medications as of 05/12/2020  Medication Sig  . Alcohol Swabs (B-D SINGLE USE SWABS REGULAR) PADS USE AS DIRECTED  . alendronate (FOSAMAX) 70 MG tablet TAKE 1 TABLET BY MOUTH EVERY WEEK WITH FULL GLASS OF WATER ON AN EMPTY STOMACH  . ALPRAZolam (XANAX) 0.25 MG tablet  Take 1 tablet by mouth three times a day if needed  . amLODipine (NORVASC) 10 MG tablet Take 1 tablet (10 mg total) by mouth daily.  .Marland Kitchenaspirin EC 81 MG tablet Take 81 mg by mouth daily.  .Marland Kitchenatorvastatin (LIPITOR) 40 MG tablet TAKE 1 TABLET(40 MG) BY MOUTH DAILY  . benzonatate (TESSALON) 100 MG capsule Take 1 capsule (100 mg total) by mouth 3 (three) times daily as needed for cough.  . Blood Glucose Calibration (TAI DOC CONTROL) NORMAL SOLN   . Blood Glucose Monitoring Suppl (TRUE METRIX AIR GLUCOSE METER) w/Device KIT 1 each by Does not apply route 4 (four) times daily. DX CODE E11.8  . Carboxymethylcellulose Sodium (THERATEARS OP) Place 1 drop into both eyes daily as needed (dry eyes).  . fenofibrate 160 MG tablet Take 1 tablet (160 mg total) by mouth daily.  . ferrous sulfate 325 (65 FE) MG tablet Take 325 mg by mouth daily with breakfast.  . Flaxseed, Linseed, (FLAXSEED OIL PO) Take 1 capsule by mouth 2 (two) times daily.   .Marland KitchenFLUoxetine (PROZAC) 20 MG capsule TAKE 1 CAPSULE EVERY DAY  . fluticasone (FLONASE) 50 MCG/ACT nasal spray Place 2 sprays into both nostrils daily as needed for allergies or rhinitis.  .Marland Kitchengabapentin (NEURONTIN) 300 MG capsule Take 1 capsule (300 mg total) by mouth 2 (two) times daily. If tolerating may take 3 times per day if needed.  .Marland Kitchenglucose blood (TRUE METRIX BLOOD GLUCOSE TEST) test strip USE AS DIRECTED 4 TIMES A DAY  . insulin regular (NOVOLIN R RELION) 100 units/mL injection Inject 0.3 mLs (30 Units total) into the skin daily as needed for high blood sugar (  at noon if CBG >200 and will be eating).  . Insulin Syringe-Needle U-100 (BD INSULIN SYRINGE U/F) 31G X 5/16" 0.3 ML MISC 10 Units by Does not apply route 3 (three) times daily before meals.  Elmore Guise Devices (LANCING DEVICE) MISC   . lisinopril (ZESTRIL) 40 MG tablet Take 1 tablet (40 mg total) by mouth daily.  . metoprolol succinate (TOPROL-XL) 50 MG 24 hr tablet Take 1 tablet (50 mg total) by mouth daily. with  food  . Multiple Vitamin (MULTIVITAMIN WITH MINERALS) TABS Take 1 tablet by mouth daily.  [name] neomycin-polymyxin-hydrocortisone (CORTISPORIN) 3.5-10000-1 OTIC suspension Place 4 drops into the left ear 3 (three) times daily as needed.  [name] NOVOLIN N RELION 100 UNIT/ML injection Inject 30-60 Units into the skin 2 (two) times daily. Inject 40 units subcutaneously before breakfast and 60 units at bedtime  . rosuvastatin (CRESTOR) 20 MG tablet Take 1 tablet (20 mg total) by mouth daily.  . TRUEplus Lancets 33G MISC USE AS DIRECTED 4 TIMES A DAY  . vitamin E 400 UNIT capsule Take 400 Units by mouth daily.   No facility-administered encounter medications on file as of 05/12/2020.    Allergies (verified) Levemir [insulin detemir] and Metformin and related   History: Past Medical History:  Diagnosis Date  . 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 Depression   . Diabetes mellitus   . Hyperlipidemia   . Hypertension   . Pancreatitis   . Restless leg syndrome    Past Surgical History:  Procedure Laterality Date  . CATARACT EXTRACTION, BILATERAL    . CORONARY ANGIOPLASTY WITH STENT PLACEMENT    . FOOT SURGERY Right    cyst removal  . ROTATOR CUFF REPAIR     right  . ROTATOR CUFF REPAIR Left   . TUBAL LIGATION     Family History  Problem Relation Age of Onset  . Heart attack Father   . Heart attack Brother   . Diabetes Sister   . Cirrhosis Sister   . Hypertension Sister   . Cancer Mother   . Hypertension Mother   . Vision loss Mother   . Cancer Sister   . Sleep apnea Sister   . Heart Problems Sister   . Diabetes Sister   . Gout Sister   . Diabetes Son   . Heart attack Son   . Diabetes Daughter   . Goiter Daughter   . Diabetes Daughter    Social History   Socioeconomic History  . Marital status: Married    Spouse name: Not on file  . Number of children: Not on file  . Years of education: Not on file  . Highest education level: Not on file  Occupational History  .  Not on file  Tobacco Use  . Smoking status: Former Smoker    Packs/day: 0.20    Types: Cigarettes    Quit date: 05/05/2020    Years since quitting: 0.0  . Smokeless tobacco: Never Used  Vaping Use  . Vaping Use: Never used  Substance and Sexual Activity  . Alcohol use: No  . Drug use: No  . Sexual activity: Not on file  Other Topics Concern  . Not on file  Social History Narrative   Exercise: seldom   Caffeine use: seldom   Social Determinants of Health   Financial Resource Strain: Low Risk   . Difficulty of Paying Living Expenses: Not hard at all  Food Insecurity: No Food Insecurity  . Worried  About Running Out of Food in the Last Year: Never true  . Ran Out of Food in the Last Year: Never true  Transportation Needs: No Transportation Needs  . Lack of Transportation (Medical): No  . Lack of Transportation (Non-Medical): No  Physical Activity:   . Days of Exercise per Week:   . Minutes of Exercise per Session:   Stress:   . Feeling of Stress :   Social Connections:   . Frequency of Communication with Friends and Family:   . Frequency of Social Gatherings with Friends and Family:   . Attends Religious Services:   . Active Member of Clubs or Organizations:   . Attends Archivist Meetings:   [name] Marital Status:     Tobacco Counseling Counseling given: Not Answered   Clinical Intake: Pain : No/denies pain   Activities of Daily Living In your present state of health, do you have any difficulty performing the following activities: 05/12/2020  Hearing? N  Vision? N  Difficulty concentrating or making decisions? N  Walking or climbing stairs? Y  Dressing or bathing? N  Doing errands, shopping? N  Preparing Food and eating ? N  Using the Toilet? N  In the past six months, have you accidently leaked urine? N  Do you have problems with loss of bowel control? N  Managing your Medications? N  Managing your Finances? N  Housekeeping or managing your  Housekeeping? N  Some recent data might be hidden    Patient Care Team: Carollee Herter, Alferd Apa, DO as PCP - General Buford Dresser, MD as PCP - Cardiology (Cardiology) Jacelyn Pi, MD as Consulting Physician (Endocrinology) Ashok Pall, MD as Consulting Physician (Neurosurgery) Monna Fam, MD as Consulting Physician (Ophthalmology) Day, Melvenia Beam, [facility] as Pharmacist (Pharmacist)  Indicate any recent Medical Services you may have received from other than [facility] providers in the past year (date may be approximate).     Assessment:   This is a routine wellness examination for [place]. Physical assessment deferred to PCP.  Dietary issues and exercise activities discussed: Current Exercise Habits: The patient does not participate in regular exercise at present, Exercise limited by: None identified Diet (meal preparation, eat out, water intake, caffeinated beverages, dairy products, fruits and vegetables): well balanced  Goals    . Increase physical activity     Start Silver Sneakers.    . Quit smoking / using tobacco      Depression Screen PHQ 2/9 Scores 05/12/2020 05/11/2019 04/01/2017 10/11/2016 11/14/2015 11/29/2014 11/17/2013  PHQ - 2 Score 0 0 0 0 0 0 0    Fall Risk Fall Risk  05/12/2020 05/11/2019 04/01/2017 10/11/2016 11/14/2015  Falls in the past year? 0 0 No No No  Number falls in past yr: 0 - - - -  Injury with Fall? 0 - - - -  Follow up Education provided;Falls prevention discussed - - - -   Lives w/ husband and grandchildren in 1 story home. Any stairs in or around the home? No  If so, are there any without handrails? No  Home free of loose throw rugs in walkways, pet beds, electrical cords, etc? Yes  Adequate lighting in your home to reduce risk of falls? Yes   ASSISTIVE DEVICES UTILIZED TO PREVENT FALLS:  Life alert? No  Use of a cane, walker or w/c? Yes  Grab bars in the bathroom? Yes  Shower chair or bench in shower? No  Elevated toilet seat or a handicapped  toilet? No  Cognitive Function: Ad8 score reviewed for issues:  Issues making decisions:no  Less interest in hobbies / activities:no  Repeats questions, stories (family complaining):no  Trouble using ordinary gadgets (microwave, computer, phone):no  Forgets the month or year: no  Mismanaging finances: no  Remembering appts:no  Daily problems with thinking and/or memory:no Ad8 score is=0     MMSE - Mini Mental State Exam 04/01/2017  Orientation to time 5  Orientation to Place 5  Registration 3  Attention/ Calculation 5  Recall 2  Language- name 2 objects 2  Language- repeat 1  Language- follow 3 step command 3  Language- read & follow direction 1  Write a sentence 1  Copy design 1  Total score 29     6CIT Screen 05/11/2019  What Year? 0 points  What month? 0 points  What time? 0 points  Count back from 20 0 points  Months in reverse 0 points  Repeat phrase 0 points  Total Score 0    Immunizations Immunization History  Administered Date(s) Administered  . Fluad Quad(high Dose 65+) 07/28/2019  . Influenza Split 08/08/2012  . Influenza, High Dose Seasonal PF 08/09/2014, 07/05/2015, 07/21/2018  . PPD Test 08/27/2011, 09/04/2011, 07/13/2014, 09/23/2015  . Pneumococcal Conjugate-13 08/09/2014  . Pneumococcal Polysaccharide-23 11/17/2013  . Td 03/11/2007  . Zoster 03/11/2007    TDAP status: Due, Education has been provided regarding the importance of this vaccine. Advised may receive this vaccine at local pharmacy or Health Dept. Aware to provide a copy of the vaccination record if obtained from local pharmacy or Health Dept. Verbalized acceptance and understanding. Flu Vaccine status: Up to date Pneumococcal vaccine status: Up to date Covid-19 vaccine status: Completed vaccines  Qualifies for Shingles Vaccine? Yes   Zostavax completed Yes   Shingrix Completed?: No.    Education has been provided regarding the importance of this vaccine. Patient has been  advised to call insurance company to determine out of pocket expense if they have not yet received this vaccine. Advised may also receive vaccine at local pharmacy or Health Dept. Verbalized acceptance and understanding.  Screening Tests Health Maintenance  Topic Date Due  . COVID-19 Vaccine (1) Never done  . OPHTHALMOLOGY EXAM  11/24/2015  . Fecal DNA (Cologuard)  04/15/2020  . INFLUENZA VACCINE  05/08/2020  . TETANUS/TDAP  12/14/2023 (Originally 03/10/2017)  . HEMOGLOBIN A1C  09/09/2020  . FOOT EXAM  03/10/2021  . MAMMOGRAM  08/17/2021  . DEXA SCAN  Completed  . Hepatitis C Screening  Completed  . PNA vac Low Risk Adult  Completed    Health Maintenance  Health Maintenance Due  Topic Date Due  . COVID-19 Vaccine (1) Never done  . OPHTHALMOLOGY EXAM  11/24/2015  . Fecal DNA (Cologuard)  04/15/2020  . INFLUENZA VACCINE  05/08/2020     Mammogram status: Completed 08/19/19. Repeat every year Pt states she would like to discuss mammogram and bone density scan w/ PCP at next OV 05/17/20.  Additional Screening:  Hepatitis C Screening: does qualify; Completed 10/21/18  Vision Screening: Recommended annual ophthalmology exams for early detection of glaucoma and other disorders of the eye. Is the patient up to date with their annual eye exam?  Yes  Who is the provider or what is the name of the office in which the patient attends annual eye exams? Dr.Hecker   Dental Screening: Recommended annual dental exams for proper oral hygiene  Community Resource Referral / Chronic Care Management: CRR required this visit?  No   CCM  required this visit?  No      Plan:    Please schedule your next medicare wellness visit with me in 1 yr.  Continue to eat heart healthy diet (full of fruits, vegetables, whole grains, lean protein, water--limit salt, fat, and sugar intake) and increase physical activity as tolerated.  Continue doing brain stimulating activities (puzzles, reading, adult  coloring books, staying active) to keep memory sharp.     I have personally reviewed and noted the following in the patient's chart:   . Medical and social history . Use of alcohol, tobacco or illicit drugs  . Current medications and supplements . Functional ability and status . Nutritional status . Physical activity . Advanced directives . List of other physicians . Hospitalizations, surgeries, and ER visits in previous 12 months . Vitals . Screenings to include cognitive, depression, and falls . Referrals and appointments  In addition, I have reviewed and discussed with patient certain preventive protocols, quality metrics, and best practice recommendations. A written personalized care plan for preventive services as well as general preventive health recommendations were provided to patient.   Due to this being a telephonic visit, the after visit summary with patients personalized plan was offered to patient via mail or my-chart. Patient declined at this time.   Shela Nevin, [place]   05/12/2020

## 2020-05-12 ENCOUNTER — Encounter: Payer: Self-pay | Admitting: *Deleted

## 2020-05-12 ENCOUNTER — Ambulatory Visit (INDEPENDENT_AMBULATORY_CARE_PROVIDER_SITE_OTHER): Payer: Medicare HMO | Admitting: *Deleted

## 2020-05-12 ENCOUNTER — Other Ambulatory Visit: Payer: Self-pay

## 2020-05-12 DIAGNOSIS — Z0000 Encounter for general adult medical examination without abnormal findings: Secondary | ICD-10-CM

## 2020-05-12 NOTE — Patient Instructions (Signed)
Please schedule your next medicare wellness visit with me in 1 yr.  Continue to eat heart healthy diet (full of fruits, vegetables, whole grains, lean protein, water--limit salt, fat, and sugar intake) and increase physical activity as tolerated.  Continue doing brain stimulating activities (puzzles, reading, adult coloring books, staying active) to keep memory sharp.    [name] , Thank you for taking time to come for your Medicare Wellness Visit. I appreciate your ongoing commitment to your health goals. Please review the following plan we discussed and let me know if I can assist you in the future.   These are the goals we discussed: Goals    . Increase physical activity     Start Silver Sneakers.    . Quit smoking / using tobacco       This is a list of the screening recommended for you and due dates:  Health Maintenance  Topic Date Due  . Eye exam for diabetics  11/24/2015  . Cologuard (Stool DNA test)  04/15/2020  . Flu Shot  05/08/2020  . Tetanus Vaccine  12/14/2023*  . Hemoglobin A1C  09/09/2020  . Complete foot exam   03/10/2021  . Mammogram  08/17/2021  . DEXA scan (bone density measurement)  Completed  . COVID-19 Vaccine  Completed  .  Hepatitis C: One time screening is recommended by Center for Disease Control  (CDC) for  adults born from [birth year] through 1965.   Completed  . Pneumonia vaccines  Completed  *Topic was postponed. The date shown is not the original due date.    Preventive Care 55 Years and Older, Female Preventive care refers to lifestyle choices and visits with your health care provider that can promote health and wellness. This includes:  A yearly physical exam. This is also called an annual well check.  Regular dental and eye exams.  Immunizations.  Screening for certain conditions.  Healthy lifestyle choices, such as diet and exercise. What can I expect for my preventive care visit? Physical exam Your health care provider will  check:  Height and weight. These may be used to calculate body mass index (BMI), which is a measurement that tells if you are at a healthy weight.  Heart rate and blood pressure.  Your skin for abnormal spots. Counseling Your health care provider may ask you questions about:  Alcohol, tobacco, and drug use.  Emotional well-being.  Home and relationship well-being.  Sexual activity.  Eating habits.  History of falls.  Memory and ability to understand (cognition).  Work and work Statistician.  Pregnancy and menstrual history. What immunizations do I need?  Influenza (flu) vaccine  This is recommended every year. Tetanus, diphtheria, and pertussis (Tdap) vaccine  You may need a Td booster every 10 years. Varicella (chickenpox) vaccine  You may need this vaccine if you have not already been vaccinated. Zoster (shingles) vaccine  You may need this after age 46. Pneumococcal conjugate (PCV13) vaccine  One dose is recommended after age 3. Pneumococcal polysaccharide (PPSV23) vaccine  One dose is recommended after age 33. Measles, mumps, and rubella (MMR) vaccine  You may need at least one dose of MMR if you were born in 1957 or later. You may also need a second dose. Meningococcal conjugate (MenACWY) vaccine  You may need this if you have certain conditions. Hepatitis A vaccine  You may need this if you have certain conditions or if you travel or work in places where you may be exposed to hepatitis A. Hepatitis  B vaccine  You may need this if you have certain conditions or if you travel or work in places where you may be exposed to hepatitis B. Haemophilus influenzae type b (Hib) vaccine  You may need this if you have certain conditions. You may receive vaccines as individual doses or as more than one vaccine together in one shot (combination vaccines). Talk with your health care provider about the risks and benefits of combination vaccines. What tests do I  need? Blood tests  Lipid and cholesterol levels. These may be checked every 5 years, or more frequently depending on your overall health.  Hepatitis C test.  Hepatitis B test. Screening  Lung cancer screening. You may have this screening every year starting at age 20 if you have a 30-pack-year history of smoking and currently smoke or have quit within the past 15 years.  Colorectal cancer screening. All adults should have this screening starting at age 35 and continuing until age 45. Your health care provider may recommend screening at age 50 if you are at increased risk. You will have tests every 1-10 years, depending on your results and the type of screening test.  Diabetes screening. This is done by checking your blood sugar (glucose) after you have not eaten for a while (fasting). You may have this done every 1-3 years.  Mammogram. This may be done every 1-2 years. Talk with your health care provider about how often you should have regular mammograms.  BRCA-related cancer screening. This may be done if you have a family history of breast, ovarian, tubal, or peritoneal cancers. Other tests  Sexually transmitted disease (STD) testing.  Bone density scan. This is done to screen for osteoporosis. You may have this done starting at age 53. Follow these instructions at home: Eating and drinking  Eat a diet that includes fresh fruits and vegetables, whole grains, lean protein, and low-fat dairy products. Limit your intake of foods with high amounts of sugar, saturated fats, and salt.  Take vitamin and mineral supplements as recommended by your health care provider.  Do not drink alcohol if your health care provider tells you not to drink.  If you drink alcohol: ? Limit how much you have to 0-1 drink a day. ? Be aware of how much alcohol is in your drink. In the [place] one drink equals one 12 oz bottle of beer (355 mL), one 5 oz glass of wine (148 mL), or one 1 oz glass of hard liquor  (44 mL). Lifestyle  Take daily care of your teeth and gums.  Stay active. Exercise for at least 30 minutes on 5 or more days each week.  Do not use any products that contain nicotine or tobacco, such as cigarettes, e-cigarettes, and chewing tobacco. If you need help quitting, ask your health care provider.  If you are sexually active, practice safe sex. Use a condom or other form of protection in order to prevent STIs (sexually transmitted infections).  Talk with your health care provider about taking a low-dose aspirin or statin. What's next?  Go to your health care provider once a year for a well check visit.  Ask your health care provider how often you should have your eyes and teeth checked.  Stay up to date on all vaccines. This information is not intended to replace advice given to you by your health care provider. Make sure you discuss any questions you have with your health care provider. Document Revised: 09/18/2018 Document Reviewed: 09/18/2018 Elsevier Patient  Education  2020 [employer]  

## 2020-05-17 ENCOUNTER — Encounter: Payer: Self-pay | Admitting: Family Medicine

## 2020-05-17 ENCOUNTER — Other Ambulatory Visit: Payer: Self-pay

## 2020-05-17 ENCOUNTER — Ambulatory Visit (INDEPENDENT_AMBULATORY_CARE_PROVIDER_SITE_OTHER): Payer: Medicare HMO | Admitting: Family Medicine

## 2020-05-17 VITALS — BP 120/60 | HR 59 | Temp 98.1°F | Resp 18 | Ht 65.0 in | Wt 175.0 lb

## 2020-05-17 DIAGNOSIS — F419 Anxiety disorder, unspecified: Secondary | ICD-10-CM | POA: Diagnosis not present

## 2020-05-17 DIAGNOSIS — H6123 Impacted cerumen, bilateral: Secondary | ICD-10-CM

## 2020-05-17 DIAGNOSIS — R05 Cough: Secondary | ICD-10-CM

## 2020-05-17 DIAGNOSIS — R059 Cough, unspecified: Secondary | ICD-10-CM

## 2020-05-17 MED ORDER — BENZONATATE 100 MG PO CAPS: 100.0000 mg | ORAL_CAPSULE | Freq: Three times a day (TID) | ORAL | 0 refills | Status: DC | PRN

## 2020-05-17 MED ORDER — ALPRAZOLAM 0.25 MG PO TABS: ORAL_TABLET | ORAL | 0 refills | Status: DC

## 2020-05-17 NOTE — Assessment & Plan Note (Signed)
Unable to irrigate or remove with cerumen hoop Use debrox and rto in few days

## 2020-05-17 NOTE — Progress Notes (Signed)
Patient ID: [name], female    DOB: 15-Nov-1945  Age: 74 y.o. MRN: [id number]    Subjective:  Subjective  HPI [name] presents for ear cleaning.  Pt states her ears feel full No other symptoms   Review of Systems  Constitutional: Negative for appetite change, diaphoresis, fatigue and unexpected weight change.  HENT: Positive for hearing loss. Negative for congestion, ear discharge, ear pain, sinus pressure, sinus pain, sneezing, sore throat, tinnitus, trouble swallowing and voice change.   Eyes: Negative for pain, redness and visual disturbance.  Respiratory: Negative for cough, chest tightness, shortness of breath and wheezing.   Cardiovascular: Negative for chest pain, palpitations and leg swelling.  Endocrine: Negative for cold intolerance, heat intolerance, polydipsia, polyphagia and polyuria.  Genitourinary: Negative for difficulty urinating, dysuria and frequency.  Neurological: Negative for dizziness, light-headedness, numbness and headaches.    History Past Medical History:  Diagnosis Date  . Blockage of coronary artery of heart ([place])   . Depression   . Diabetes mellitus   . Hyperlipidemia   . Hypertension   . Pancreatitis   . Restless leg syndrome     She has a past surgical history that includes Tubal ligation; Rotator cuff repair; Coronary angioplasty with stent; Foot surgery (Right); Cataract extraction, bilateral; and Rotator cuff repair (Left).   Her family history includes Cancer in her mother and sister; Cirrhosis in her sister; Diabetes in her daughter, daughter, sister, sister, and son; Goiter in her daughter; Gout in her sister; Heart Problems in her sister; Heart attack in her brother, father, and son; Hypertension in her mother and sister; Sleep apnea in her sister; Vision loss in her mother.She reports that she quit smoking 12 days ago. Her smoking use included cigarettes. She smoked 0.20 packs per day. She has never used smokeless tobacco. She  reports that she does not drink alcohol and does not use drugs.  Current Outpatient Medications on File Prior to Visit  Medication Sig Dispense Refill  . Alcohol Swabs (B-D SINGLE USE SWABS REGULAR) PADS USE AS DIRECTED 400 each 1  . alendronate (FOSAMAX) 70 MG tablet TAKE 1 TABLET BY MOUTH EVERY WEEK WITH FULL GLASS OF WATER ON AN EMPTY STOMACH 12 tablet 1  . amLODipine (NORVASC) 10 MG tablet Take 1 tablet (10 mg total) by mouth daily. 90 tablet 1  . aspirin EC 81 MG tablet Take 81 mg by mouth daily.    [name] atorvastatin (LIPITOR) 40 MG tablet TAKE 1 TABLET(40 MG) BY MOUTH DAILY 90 tablet 1  . Blood Glucose Calibration (TAI DOC CONTROL) NORMAL SOLN     . Blood Glucose Monitoring Suppl (TRUE METRIX AIR GLUCOSE METER) w/Device KIT 1 each by Does not apply route 4 (four) times daily. DX CODE E11.8 1 kit 0  . Carboxymethylcellulose Sodium (THERATEARS OP) Place 1 drop into both eyes daily as needed (dry eyes).    . fenofibrate 160 MG tablet Take 1 tablet (160 mg total) by mouth daily. 90 tablet 1  . ferrous sulfate 325 (65 FE) MG tablet Take 325 mg by mouth daily with breakfast.    . Flaxseed, Linseed, (FLAXSEED OIL PO) Take 1 capsule by mouth 2 (two) times daily.     [name] FLUoxetine (PROZAC) 20 MG capsule TAKE 1 CAPSULE EVERY DAY 90 capsule 1  . fluticasone (FLONASE) 50 MCG/ACT nasal spray Place 2 sprays into both nostrils daily as needed for allergies or rhinitis.    [name] gabapentin (NEURONTIN) 300 MG capsule Take 1 capsule (  300 mg total) by mouth 2 (two) times daily. If tolerating may take 3 times per day if needed. 90 capsule 0  . glucose blood (TRUE METRIX BLOOD GLUCOSE TEST) test strip USE AS DIRECTED 4 TIMES A DAY 400 strip 1  . insulin regular (NOVOLIN R RELION) 100 units/mL injection Inject 0.3 mLs (30 Units total) into the skin daily as needed for high blood sugar (at noon if CBG >200 and will be eating). 10 mL   . Insulin Syringe-Needle U-100 (BD INSULIN SYRINGE U/F) 31G X 5/16" 0.3 ML MISC 10  Units by Does not apply route 3 (three) times daily before meals. 100 each 0  . Lancet Devices (LANCING DEVICE) MISC     . lisinopril (ZESTRIL) 40 MG tablet Take 1 tablet (40 mg total) by mouth daily. 90 tablet 3  . metoprolol succinate (TOPROL-XL) 50 MG 24 hr tablet Take 1 tablet (50 mg total) by mouth daily. with food 90 tablet 0  . Multiple Vitamin (MULTIVITAMIN WITH MINERALS) TABS Take 1 tablet by mouth daily.    [name] neomycin-polymyxin-hydrocortisone (CORTISPORIN) 3.5-10000-1 OTIC suspension Place 4 drops into the left ear 3 (three) times daily as needed.    [name] NOVOLIN N RELION 100 UNIT/ML injection Inject 30-60 Units into the skin 2 (two) times daily. Inject 40 units subcutaneously before breakfast and 60 units at bedtime    . rosuvastatin (CRESTOR) 20 MG tablet Take 1 tablet (20 mg total) by mouth daily. 30 tablet 2  . TRUEplus Lancets 33G MISC USE AS DIRECTED 4 TIMES A DAY 400 each 1  . vitamin E 400 UNIT capsule Take 400 Units by mouth daily.     No current facility-administered medications on file prior to visit.     Objective:  Objective  Physical Exam Vitals and nursing note reviewed.  Constitutional:      Appearance: She is well-developed.  HENT:     Head: Normocephalic and atraumatic.     Right Ear: There is impacted cerumen.     Left Ear: There is impacted cerumen.     Ears:   Eyes:     Conjunctiva/sclera: Conjunctivae normal.  Neck:     Thyroid: No thyromegaly.     Vascular: No carotid bruit or JVD.  Cardiovascular:     Rate and Rhythm: Normal rate and regular rhythm.     Heart sounds: Normal heart sounds. No murmur heard.   Pulmonary:     Effort: Pulmonary effort is normal. No respiratory distress.     Breath sounds: Normal breath sounds. No wheezing or rales.  Chest:     Chest wall: No tenderness.  Musculoskeletal:     Cervical back: Normal range of motion and neck supple.  Neurological:     Mental Status: She is alert and oriented to person, place, and time.      BP 120/60 (BP Location: Right Arm, Patient Position: Sitting, Cuff Size: Large)   Pulse (!) 59   Temp 98.1 F (36.7 C) (Oral)   Resp 18   Ht '5\' 5"'  (1.651 m)   Wt 175 lb (79.4 kg)   SpO2 100%   BMI 29.12 kg/m  Wt Readings from Last 3 Encounters:  05/17/20 175 lb (79.4 kg)  03/10/20 176 lb 3.2 oz (79.9 kg)  10/28/19 175 lb (79.4 kg)     Lab Results  Component Value Date   WBC 11.2 (H) 08/11/2018   HGB 12.3 08/11/2018   HCT 36.9 08/11/2018   PLT 221.0 08/11/2018  GLUCOSE 138 (H) 03/10/2020   CHOL 171 03/10/2020   TRIG 204.0 (H) 03/10/2020   HDL 37.00 (L) 03/10/2020   LDLDIRECT 100.0 03/10/2020   LDLCALC 78 07/28/2019   ALT 18 03/10/2020   AST 18 03/10/2020   NA 138 03/10/2020   K 4.0 03/10/2020   CL 101 03/10/2020   CREATININE 1.20 03/10/2020   BUN 24 (H) 03/10/2020   CO2 29 03/10/2020   TSH 0.58 08/11/2018   INR 1.1 03/03/2009   HGBA1C 9.4 (H) 03/10/2020   MICROALBUR 27.3 (H) 07/28/2019    US THYROID  Result Date: 02/16/2020 CLINICAL DATA:  Nontoxic goiter. Previous FNA biopsy inferior left and inferior right nodules 03/14/2009 EXAM: THYROID ULTRASOUND TECHNIQUE: Ultrasound examination of the thyroid gland and adjacent soft tissues was performed. COMPARISON:  08/11/2018 and previous FINDINGS: Parenchymal Echotexture: Markedly heterogenous Isthmus: 1 cm thickness, previously 1.2 Right lobe: 7.6 x 3.3 x 3.8 cm, previously 7.3 x 3.3 x 3.4 Left lobe: 9 x 4.1 x 4.9 cm, previously 10 x 4.7 x 5 _________________________________________________________ Estimated total number of nodules >/= 1 cm: 2 Number of spongiform nodules >/=  2 cm not described below (TR1): 0 Number of mixed cystic and solid nodules >/= 1.5 cm not described below ([place]): 0 _________________________________________________________ Nodule # 1: Prior biopsy: No Location: Isthmus; Maximum size: 1.7 cm; Other 2 dimensions: 1.3 x 1.3 cm, previously, 1.4 x 1.1 x 1.1 cm Composition: solid/almost completely solid  (2) Echogenicity: isoechoic (1) Shape: not taller-than-wide (0) Margins: smooth (0) Echogenic foci: none (0) ACR TI-RADS total points: 3. ACR TI-RADS risk category:  TR3 (3 points). Significant change in size (>/= 20% in two dimensions and minimal increase of 2 mm): Yes Change in features: No Change in ACR TI-RADS risk category: No ACR TI-RADS recommendations: *Given size (>/= 1.5 - 2.4 cm) and appearance, a follow-up ultrasound in 1 year should be considered based on TI-RADS criteria. _________________________________________________________ Nodule # 2: 4.4 x 2.6 x 3.6 cm inferior right, previously 4.7 x 3 x 3.4; this was previously biopsied Background parenchyma is diffusely nodular without any other discrete well-marginated mass. 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 No current indication for biopsy or dedicated imaging follow-up. The above is in keeping with the ACR TI-RADS recommendations - J Am Coll Radiol 2017;14:587-595. Electronically Signed   By: Lucrezia Europe M.D.   On: 02/16/2020 15:44     Assessment & Plan:  Plan  I am having Deshara C. Siess maintain her NovoLIN N ReliOn, multivitamin with minerals, Lancing Device, Tai Doc Control, aspirin EC, (Flaxseed, Linseed, (FLAXSEED OIL PO)), ferrous sulfate, vitamin E, Carboxymethylcellulose Sodium (THERATEARS OP), insulin regular, atorvastatin, True Metrix Air Glucose Meter, True Metrix Blood Glucose Test, TRUEplus Lancets 33G, fenofibrate, fluticasone, neomycin-polymyxin-hydrocortisone, lisinopril, gabapentin, Insulin Syringe-Needle U-100, alendronate, amLODipine, FLUoxetine, B-D SINGLE USE SWABS REGULAR, rosuvastatin, metoprolol succinate, ALPRAZolam, and benzonatate.  Meds ordered this encounter  Medications  . ALPRAZolam (XANAX) 0.25 MG tablet    Sig: Take 1 tablet by mouth three times a day if needed    Dispense:  60 tablet    Refill:  0  . benzonatate (TESSALON) 100 MG capsule    Sig: Take 1 capsule (100 mg total)  by mouth 3 (three) times daily as needed for cough.    Dispense:  30 capsule    Refill:  0    Problem List Items Addressed This Visit      Unprioritized   Bilateral impacted cerumen - Primary    Unable to irrigate  or remove with cerumen hoop Use debrox and rto in few days       Cough   Relevant Medications   benzonatate (TESSALON) 100 MG capsule    Other Visit Diagnoses    Anxiety       Relevant Medications   ALPRAZolam (XANAX) 0.25 MG tablet      Follow-up: Return in about 1 week (around 05/24/2020), or if symptoms worsen or fail to improve, for ear irrigation.  Ann Held, DO

## 2020-05-17 NOTE — Patient Instructions (Signed)
Earwax Buildup, Adult The ears produce a substance called earwax that helps keep bacteria out of the ear and protects the skin in the ear canal. Occasionally, earwax can build up in the ear and cause discomfort or hearing loss. What increases the risk? This condition is more likely to develop in people who:  Are female.  Are elderly.  Naturally produce more earwax.  Clean their ears often with cotton swabs.  Use earplugs often.  Use in-ear headphones often.  Wear hearing aids.  Have narrow ear canals.  Have earwax that is overly thick or sticky.  Have eczema.  Are dehydrated.  Have excess hair in the ear canal. What are the signs or symptoms? Symptoms of this condition include:  Reduced or muffled hearing.  A feeling of fullness in the ear or feeling that the ear is plugged.  Fluid coming from the ear.  Ear pain.  Ear itch.  Ringing in the ear.  Coughing.  An obvious piece of earwax that can be seen inside the ear canal. How is this diagnosed? This condition may be diagnosed based on:  Your symptoms.  Your medical history.  An ear exam. During the exam, your health care provider will look into your ear with an instrument called an otoscope. You may have tests, including a hearing test. How is this treated? This condition may be treated by:  Using ear drops to soften the earwax.  Having the earwax removed by a health care provider. The health care provider may: ? Flush the ear with water. ? Use an instrument that has a loop on the end (curette). ? Use a suction device.  Surgery to remove the wax buildup. This may be done in severe cases. Follow these instructions at home:   Take over-the-counter and prescription medicines only as told by your health care provider.  Do not put any objects, including cotton swabs, into your ear. You can clean the opening of your ear canal with a washcloth or facial tissue.  Follow instructions from your health care  provider about cleaning your ears. Do not over-clean your ears.  Drink enough fluid to keep your urine clear or pale yellow. This will help to thin the earwax.  Keep all follow-up visits as told by your health care provider. If earwax builds up in your ears often or if you use hearing aids, consider seeing your health care provider for routine, preventive ear cleanings. Ask your health care provider how often you should schedule your cleanings.  If you have hearing aids, clean them according to instructions from the manufacturer and your health care provider. Contact a health care provider if:  You have ear pain.  You develop a fever.  You have blood, pus, or other fluid coming from your ear.  You have hearing loss.  You have ringing in your ears that does not go away.  Your symptoms do not improve with treatment.  You feel like the room is spinning (vertigo). Summary  Earwax can build up in the ear and cause discomfort or hearing loss.  The most common symptoms of this condition include reduced or muffled hearing and a feeling of fullness in the ear or feeling that the ear is plugged.  This condition may be diagnosed based on your symptoms, your medical history, and an ear exam.  This condition may be treated by using ear drops to soften the earwax or by having the earwax removed by a health care provider.  Do not put any   objects, including cotton swabs, into your ear. You can clean the opening of your ear canal with a washcloth or facial tissue. This information is not intended to replace advice given to you by your health care provider. Make sure you discuss any questions you have with your health care provider. Document Revised: 09/06/2017 Document Reviewed: 12/05/2016 Elsevier Patient Education  2020 Elsevier Inc.  

## 2020-05-19 ENCOUNTER — Telehealth: Payer: Medicare HMO

## 2020-05-19 NOTE — Chronic Care Management (AMB) (Deleted)
Chronic Care Management Pharmacy  Name: [name]  MRN: [id number] DOB: [birth year]  Chief Complaint/ HPI  [name],  74 y.o. , female presents for their Initial CCM visit with the clinical pharmacist via telephone due to COVID-19 Pandemic.  PCP : Ann Held, DO  Their chronic conditions include: {CHL AMB CHRONIC MEDICAL CONDITIONS:[phone]}  Office Visits: 05/17/20: Visit w/ Dr. Etter Sjogren - Cerumen impaction. Unable to remove w/ cerumen loop. Use debrox and rto in few days. Tessalon pearls for cough. RTC 1 week.   05/12/20: Medicare Annual Wellness Exam w/ [name], RN -  Goal updated to start silver sneakers and stop smoking.  03/10/20: Visit w/ Dr. Etter Sjogren - Atorvastatin changed to rosuvastatin 41m daily  Consult Visit: 02/10/20: Optometry visit w/ [name]  02/08/20: Endo visit w/ Dr. BChalmers Cater 01/06/20: [place]/ Dr. CHarrell Gave- Increase lisinopril to 449m Would like patient to be on SGLT2 or GLP1RA  12/02/19: Cardio visit w/ Dr. ChHarrell Gave Increase lisinopril to 2011maily    Medications: Outpatient Encounter Medications as of 05/19/2020  Medication Sig  . Alcohol Swabs (B-D SINGLE USE SWABS REGULAR) PADS USE AS DIRECTED  . alendronate (FOSAMAX) 70 MG tablet TAKE 1 TABLET BY MOUTH EVERY WEEK WITH FULL GLASS OF WATER ON AN EMPTY STOMACH  . ALPRAZolam (XANAX) 0.25 MG tablet Take 1 tablet by mouth three times a Catherina Pates if needed  . amLODipine (NORVASC) 10 MG tablet Take 1 tablet (10 mg total) by mouth daily.  . aMarland Kitchenpirin EC 81 MG tablet Take 81 mg by mouth daily.  . aMarland Kitchenorvastatin (LIPITOR) 40 MG tablet TAKE 1 TABLET(40 MG) BY MOUTH DAILY  . benzonatate (TESSALON) 100 MG capsule Take 1 capsule (100 mg total) by mouth 3 (three) times daily as needed for cough.  . Blood Glucose Calibration (TAI DOC CONTROL) NORMAL SOLN   . Blood Glucose Monitoring Suppl (TRUE METRIX AIR GLUCOSE METER) w/Device KIT 1 each by Does not apply route 4 (four) times  daily. DX CODE E11.8  . Carboxymethylcellulose Sodium (THERATEARS OP) Place 1 drop into both eyes daily as needed (dry eyes).  . fenofibrate 160 MG tablet Take 1 tablet (160 mg total) by mouth daily.  . ferrous sulfate 325 (65 FE) MG tablet Take 325 mg by mouth daily with breakfast.  . Flaxseed, Linseed, (FLAXSEED OIL PO) Take 1 capsule by mouth 2 (two) times daily.   . FMarland KitchenUoxetine (PROZAC) 20 MG capsule TAKE 1 CAPSULE EVERY Brennen Gardiner  . fluticasone (FLONASE) 50 MCG/ACT nasal spray Place 2 sprays into both nostrils daily as needed for allergies or rhinitis.  . gMarland Kitchenbapentin (NEURONTIN) 300 MG capsule Take 1 capsule (300 mg total) by mouth 2 (two) times daily. If tolerating may take 3 times per Xerxes Agrusa if needed.  . gMarland Kitchenucose blood (TRUE METRIX BLOOD GLUCOSE TEST) test strip USE AS DIRECTED 4 TIMES A Curtiss Mahmood  . insulin regular (NOVOLIN R RELION) 100 units/mL injection Inject 0.3 mLs (30 Units total) into the skin daily as needed for high blood sugar (at noon if CBG >200 and will be eating).  . Insulin Syringe-Needle U-100 (BD INSULIN SYRINGE U/F) 31G X 5/16" 0.3 ML MISC 10 Units by Does not apply route 3 (three) times daily before meals.  . LElmore Guisevices (LANCING DEVICE) MISC   . lisinopril (ZESTRIL) 40 MG tablet Take 1 tablet (40 mg total) by mouth daily.  . metoprolol succinate (TOPROL-XL) 50 MG 24 hr tablet Take 1 tablet (50 mg total) by  mouth daily. with food  . Multiple Vitamin (MULTIVITAMIN WITH MINERALS) TABS Take 1 tablet by mouth daily.  [name] neomycin-polymyxin-hydrocortisone (CORTISPORIN) 3.5-10000-1 OTIC suspension Place 4 drops into the left ear 3 (three) times daily as needed.  [name] NOVOLIN N RELION 100 UNIT/ML injection Inject 30-60 Units into the skin 2 (two) times daily. Inject 40 units subcutaneously before breakfast and 60 units at bedtime  . rosuvastatin (CRESTOR) 20 MG tablet Take 1 tablet (20 mg total) by mouth daily.  . TRUEplus Lancets 33G MISC USE AS DIRECTED 4 TIMES A Talajah Slimp  . vitamin E 400 UNIT  capsule Take 400 Units by mouth daily.   No facility-administered encounter medications on file as of 05/19/2020.     Current Diagnosis/Assessment:  Goals Addressed   None    Hypertension   BP goal is:  <130/80 (per Cardio)  Office blood pressures are  BP Readings from Last 3 Encounters:  05/17/20 120/60  03/10/20 134/68  12/02/19 (!) 150/75   Patient checks BP at home {CHL HP BP Monitoring Frequency:[phone]} Patient home BP readings are ranging: ***  Patient has failed these meds in the past: *** Patient is currently {CHL Controlled/[phone]} on the following medications:  . Amlodipine 65m daily  We discussed {CHL HP Upstream Pharmacy [phone]}  Plan  Continue {CHL HP Upstream Pharmacy Plans:[phone]}     Diabetes   A1c goal <7%  Recent Relevant Labs: Lab Results  Component Value Date/Time   HGBA1C 9.4 (H) 03/10/2020 02:10 PM   HGBA1C 10.1 (H) 07/28/2019 02:50 PM   GFR 53.11 (L) 03/10/2020 02:10 PM   GFR 72.29 07/28/2019 02:50 PM   MICROALBUR 27.3 (H) 07/28/2019 02:50 PM   MICROALBUR 1.7 06/23/2015 10:21 AM    Last diabetic Eye exam: (noted April 2021) Lab Results  Component Value Date/Time   HMDIABEYEEXA Cataracts 07/08/2013 12:00 AM    Last diabetic Foot exam: No results found for: HMDIABFOOTEX (noted on 03/10/20 visit)  Checking BG: {CHL HP Blood Glucose Monitoring Frequency:[phone]}  Recent FBG Readings: *** Recent pre-meal BG readings: *** Recent 2hr PP BG readings:  *** Recent HS BG readings: ***  Patient has failed these meds in past: *** Patient is currently {CHL Controlled/[phone]} on the following medications: . ***  We discussed: {CHL HP Upstream Pharmacy [phone]}  Plan  Continue {CHL HP Upstream Pharmacy Plans:[phone]}  Hyperlipidemia/Hx of CAD   LDL goal <70  Lipid Panel     Component Value Date/Time   CHOL 171 03/10/2020 1410   TRIG 204.0 (H) 03/10/2020  1410   HDL 37.00 (L) 03/10/2020 1410   LDLCALC 78 07/28/2019 1450   LDLDIRECT 100.0 03/10/2020 1410    Hepatic Function Latest Ref Rng & Units 03/10/2020 07/28/2019 10/21/2018  Total Protein 6.0 - 8.3 g/dL 6.7 6.5 6.7  Albumin 3.5 - 5.2 g/dL 4.4 4.3 -  AST 0 - 37 U/L 18 18 -  ALT 0 - 35 U/L 18 20 -  Alk Phosphatase 39 - 117 U/L 39 62 -  Total Bilirubin 0.2 - 1.2 mg/dL 0.7 0.8 -  Bilirubin, Direct 0.0 - 0.3 mg/dL - - -     The 10-year ASCVD risk score (Mikey BussingDC Jr., et al., 2013) is: 22.7%   Values used to calculate the score:     Age: [age]years     Sex: Female     Is Non-Hispanic African American: Yes     Diabetic: Yes     Tobacco smoker: No     Systolic Blood Pressure:  120 mmHg     Is BP treated: Yes     HDL Cholesterol: 37 mg/dL     Total Cholesterol: 171 mg/dL   Patient has failed these meds in past: *** Patient is currently {CHL Controlled/Uncontrolled:[phone]} on the following medications:  . Aspirin 56m daily . Fenofibrate 1689mdaily . Flaxseed Oil 1 capsule twice daily   We discussed:  {CHL HP Upstream Pharmacy discussion:[phone]}  Plan  Continue {CHL HP Upstream Pharmacy PlDBZMC:[id number]} Tobacco Abuse   Tobacco Status:  Social History   Tobacco Use  Smoking Status Former Smoker  . Packs/Maneh Sieben: 0.20  . Types: Cigarettes  . Quit date: 05/05/2020  . Years since quitting: 0.0  Smokeless Tobacco Never Used    Patient smokes {Time to first cigarette:23873} Patient triggers include: {Smoking Triggers:23882} On a scale of 1-10, reports MOTIVATION to quit is *** On a scale of 1-10, reports CONFIDENCE in quitting is ***  Previous quit attempts included: *** Patient is currently {CHL Controlled/Uncontrolled:[phone]} on the following medications:  . ***  We discussed:  {Smoking Cessation Counseling:23883}  Plan  Continue {CHL HP Upstream Pharmacy PlESLPN:[id number]} Depression/Anxiety   [facility] 05/12/2020 05/11/2019 04/01/2017    Decreased Interest 0 0 0  Down, Depressed, Hopeless 0 0 0  PHQ - 2 Score 0 0 0  Some recent data might be hidden    Patient has failed these meds in past: *** Patient is currently {CHL Controlled/Uncontrolled:[phone]} on the following medications:  . Alprazolam 0.2528mhree times daily as needed . Fluoxetine 57m38mily  We discussed:  ***  Plan  Continue {CHL HP Upstream Pharmacy [phone]}   Osteopenia / Osteoporosis   Last DEXA Scan: ***   T-Score femoral neck: ***  T-Score total hip: ***  T-Score lumbar spine: ***  T-Score forearm radius: ***  10-year probability of major osteoporotic fracture: ***  10-year probability of hip fracture: ***  No results found for: VD25OH   Patient {is;is not an osteoporosis candidate:23886}  Patient has failed these meds in past: *** Patient is currently {CHL Controlled/Uncontrolled:[phone]} on the following medications:  . Alendronate 70mg65mkly  We discussed:  {Osteoporosis Counseling:23892}  Plan  Continue {CHL HP Upstream Pharmacy [phone]}  Neuropathy    Patient has failed these meds in past: *** Patient is currently {CHL Controlled/Uncontrolled:[phone]} on the following medications: . Gabapentin 300mg 70me to three times daily  We discussed:  {CHL HP Upstream Pharmacy discussion:[phone]}  Plan  Continue {CHL HP Upstream Pharmacy Plans:[id number]}cellaneous Meds Benzonatate 100mg T34mtears Ferrous Sulfate 325mg Fl35masone 50mcg   13m to D/C from list Atorvastatin 40mg  ***50mDULE FOLLOW UP WITH HENRY***

## 2020-05-19 NOTE — Chronic Care Management (AMB) (Deleted)
Chronic Care Management Pharmacy  Name: [name]  MRN: [id number] DOB: 1945-11-10  Chief Complaint/ HPI  [name],  74 y.o. , female presents for their Initial CCM visit with the clinical pharmacist via telephone due to COVID-19 Pandemic.  PCP : Ann Held, DO  Their chronic conditions include: {CHL AMB CHRONIC MEDICAL CONDITIONS:[phone]}  Office Visits: 05/17/20: Visit w/ Dr. Etter Sjogren - Cerumen impaction. Unable to remove w/ cerumen loop. Use debrox and rto in few days. Tessalon pearls for cough. RTC 1 week.   05/12/20: Medicare Annual Wellness Exam w/ [name], RN -  Goal updated to start silver sneakers and stop smoking.  03/10/20: Visit w/ Dr. Etter Sjogren - Atorvastatin changed to rosuvastatin 73m daily  Consult Visit: 02/10/20: Optometry visit w/ [name]  02/08/20: Endo visit w/ Dr. BChalmers Cater 01/06/20: [place] w/ Dr. CHarrell Gave- Increase lisinopril to 444m Would like patient to be on SGLT2 or GLP1RA  12/02/19: Cardio visit w/ Dr. ChHarrell Gave Increase lisinopril to 2031maily    Medications: Outpatient Encounter Medications as of 05/19/2020  Medication Sig  . Alcohol Swabs (B-D SINGLE USE SWABS REGULAR) PADS USE AS DIRECTED  . alendronate (FOSAMAX) 70 MG tablet TAKE 1 TABLET BY MOUTH EVERY WEEK WITH FULL GLASS OF WATER ON AN EMPTY STOMACH  . ALPRAZolam (XANAX) 0.25 MG tablet Take 1 tablet by mouth three times a Harrington Jobe if needed  . amLODipine (NORVASC) 10 MG tablet Take 1 tablet (10 mg total) by mouth daily.  . aMarland Kitchenpirin EC 81 MG tablet Take 81 mg by mouth daily.  . aMarland Kitchenorvastatin (LIPITOR) 40 MG tablet TAKE 1 TABLET(40 MG) BY MOUTH DAILY  . benzonatate (TESSALON) 100 MG capsule Take 1 capsule (100 mg total) by mouth 3 (three) times daily as needed for cough.  . Blood Glucose Calibration (TAI DOC CONTROL) NORMAL SOLN   . Blood Glucose Monitoring Suppl (TRUE METRIX AIR GLUCOSE METER) w/Device KIT 1 each by Does not apply route 4 (four) times  daily. DX CODE E11.8  . Carboxymethylcellulose Sodium (THERATEARS OP) Place 1 drop into both eyes daily as needed (dry eyes).  . fenofibrate 160 MG tablet Take 1 tablet (160 mg total) by mouth daily.  . ferrous sulfate 325 (65 FE) MG tablet Take 325 mg by mouth daily with breakfast.  . Flaxseed, Linseed, (FLAXSEED OIL PO) Take 1 capsule by mouth 2 (two) times daily.   . FMarland KitchenUoxetine (PROZAC) 20 MG capsule TAKE 1 CAPSULE EVERY Harleigh Civello  . fluticasone (FLONASE) 50 MCG/ACT nasal spray Place 2 sprays into both nostrils daily as needed for allergies or rhinitis.  . gMarland Kitchenbapentin (NEURONTIN) 300 MG capsule Take 1 capsule (300 mg total) by mouth 2 (two) times daily. If tolerating may take 3 times per Pristine Gladhill if needed.  . gMarland Kitchenucose blood (TRUE METRIX BLOOD GLUCOSE TEST) test strip USE AS DIRECTED 4 TIMES A Naod Sweetland  . insulin regular (NOVOLIN R RELION) 100 units/mL injection Inject 0.3 mLs (30 Units total) into the skin daily as needed for high blood sugar (at noon if CBG >200 and will be eating).  . Insulin Syringe-Needle U-100 (BD INSULIN SYRINGE U/F) 31G X 5/16" 0.3 ML MISC 10 Units by Does not apply route 3 (three) times daily before meals.  . LElmore Guisevices (LANCING DEVICE) MISC   . lisinopril (ZESTRIL) 40 MG tablet Take 1 tablet (40 mg total) by mouth daily.  . metoprolol succinate (TOPROL-XL) 50 MG 24 hr tablet Take 1 tablet (50 mg total) by  mouth daily. with food  . Multiple Vitamin (MULTIVITAMIN WITH MINERALS) TABS Take 1 tablet by mouth daily.  [name] neomycin-polymyxin-hydrocortisone (CORTISPORIN) 3.5-10000-1 OTIC suspension Place 4 drops into the left ear 3 (three) times daily as needed.  [name] NOVOLIN N RELION 100 UNIT/ML injection Inject 30-60 Units into the skin 2 (two) times daily. Inject 40 units subcutaneously before breakfast and 60 units at bedtime  . rosuvastatin (CRESTOR) 20 MG tablet Take 1 tablet (20 mg total) by mouth daily.  . TRUEplus Lancets 33G MISC USE AS DIRECTED 4 TIMES A Emelee Rodocker  . vitamin E 400 UNIT  capsule Take 400 Units by mouth daily.   No facility-administered encounter medications on file as of 05/19/2020.     Current Diagnosis/Assessment:  Goals Addressed   None    Hypertension   BP goal is:  <130/80 (per Cardio)  Office blood pressures are  BP Readings from Last 3 Encounters:  05/17/20 120/60  03/10/20 134/68  12/02/19 (!) 150/75   Patient checks BP at home {CHL HP BP Monitoring Frequency:[phone]} Patient home BP readings are ranging: ***  Patient has failed these meds in the past: None noted  Patient is currently {CHL Controlled/Uncontrolled:[id number]} on the following medications:  . Amlodipine 30m daily . Lisinopril 428mdaily . Metoprolol Succinate 5036maily  We discussed {CHL HP Upstream Pharmacy discussion:[phone]}  Plan  Continue {CHL HP Upstream Pharmacy [phone]}     Diabetes   A1c goal <7%  Recent Relevant Labs: Lab Results  Component Value Date/Time   HGBA1C 9.4 (H) 03/10/2020 02:10 PM   HGBA1C 10.1 (H) 07/28/2019 02:50 PM   GFR 53.11 (L) 03/10/2020 02:10 PM   GFR 72.29 07/28/2019 02:50 PM   MICROALBUR 27.3 (H) 07/28/2019 02:50 PM   MICROALBUR 1.7 06/23/2015 10:21 AM    Last diabetic Eye exam: (noted April 2021) Lab Results  Component Value Date/Time   HMDIABEYEEXA Cataracts 07/08/2013 12:00 AM    Last diabetic Foot exam: No results found for: HMDIABFOOTEX (noted on 03/10/20 visit)  Checking BG: {CHL HP Blood Glucose Monitoring Frequency:[phone]}  Recent FBG Readings: *** Recent pre-meal BG readings: *** Recent 2hr PP BG readings:  *** Recent HS BG readings: ***  Patient has failed these meds in past: None noted  Patient is currently uncontrolled on the following medications: . Relion Novolin R 30 units daily as needed for high blood sugar (at noon if BG >200 and will be eating ) . Relion Novolin N 40 units before breakfast and 60 units before bedtime  What happened to Lantus?  Humalog?  We discussed:  {CHL HP Upstream Pharmacy discussion:[phone]}  Plan  Continue {CHL HP Upstream Pharmacy [phone]}  Hyperlipidemia/Hx of CAD   LDL goal <70  Lipid Panel     Component Value Date/Time   CHOL 171 03/10/2020 1410   TRIG 204.0 (H) 03/10/2020 1410   HDL 37.00 (L) 03/10/2020 1410   LDLCALC 78 07/28/2019 1450   LDLDIRECT 100.0 03/10/2020 1410    Hepatic Function Latest Ref Rng & Units 03/10/2020 07/28/2019 10/21/2018  Total Protein 6.0 - 8.3 g/dL 6.7 6.5 6.7  Albumin 3.5 - 5.2 g/dL 4.4 4.3 -  AST 0 - 37 U/L 18 18 -  ALT 0 - 35 U/L 18 20 -  Alk Phosphatase 39 - 117 U/L 39 62 -  Total Bilirubin 0.2 - 1.2 mg/dL 0.7 0.8 -  Bilirubin, Direct 0.0 - 0.3 mg/dL - - -     The 10-year ASCVD risk score (GoMikey Bussing Jr.Brooke Bonitoet al.,  2013) is: 22.7%   Values used to calculate the score:     Age: 16 years     Sex: Female     Is Non-Hispanic African American: Yes     Diabetic: Yes     Tobacco smoker: No     Systolic Blood Pressure: 161 mmHg     Is BP treated: Yes     HDL Cholesterol: 37 mg/dL     Total Cholesterol: 171 mg/dL   Patient has failed these meds in past: atorvastatin (inefficacy?), lovaza (listed in D/C meds. No apparent reason for D/C) Patient is currently uncontrolled on the following medications:  . Aspirin 27m daily . Fenofibrate 1676mdaily . Flaxseed Oil 1 capsule twice daily  . Rosuvastatin 2023maily  We discussed:  {CHL HP Upstream Pharmacy discussion:[phone]}  Plan  Continue {CHL HP Upstream Pharmacy PlaWRUEA:[id number]}Tobacco Abuse   Tobacco Status:  Social History   Tobacco Use  Smoking Status Former Smoker  . Packs/Koree Schopf: 0.20  . Types: Cigarettes  . Quit date: 05/05/2020  . Years since quitting: 0.0  Smokeless Tobacco Never Used    Patient smokes {Time to first cigarette:23873} Patient triggers include: {Smoking Triggers:23882} On a scale of 1-10, reports MOTIVATION to quit is *** On a scale of 1-10, reports CONFIDENCE in quitting is  ***  Previous quit attempts included: *** Patient is currently {CHL Controlled/[phone]} on the following medications:  . ***  We discussed:  {Smoking Cessation Counseling:23883}  Plan  Continue {CHL HP Upstream Pharmacy PlaNWGNF:[id number]}Depression/Anxiety   [facility] 05/12[facility]2020 05/11/2019 04/01/2017  Decreased Interest 0 0 0  Down, Depressed, Hopeless 0 0 0  PHQ - 2 Score 0 0 0  Some recent data might be hidden    Patient has failed these meds in past: sertraline (palpitations) Patient is currently {CHL Controlled/[phone]} on the following medications:  . Alprazolam 0.58m13mree times daily as needed . Fluoxetine 20mg55mly  We discussed:  ***  Plan  Continue {CHL HP Upstream Pharmacy Plans:[phone]}   Osteopenia / Osteoporosis   Last DEXA Scan: 05/29/2017  T-Score femoral neck: -2.0  T-Score forearm radius: -2.5  No results found for: VD25OH   Patient has failed these meds in past: None noted  Patient is currently {CHL Controlled/[phone]} on the following medications:  . Alendronate 70mg 35mly  We discussed:  {Osteoporosis Counseling:23892}  Plan -Complete DEXA Scan Continue {CHL HP Upstream Pharmacy Plans:[phone]}  Neuropathy    Patient has failed these meds in past: None noted  Patient is currently {CHL Controlled/[phone]} on the following medications: . Gabapentin 300mg t34m to three times daily  We discussed:  {CHL HP Upstream Pharmacy discussion:[phone]}  Plan  Continue {CHL HP Upstream Pharmacy Plans:[id number]}ellaneous Meds Benzonatate 100mg Th70mears Ferrous Sulfate 358mg Flu19msone 50mcg Mul58mtamin Cortisporin Vitamin E (Need to D/C if no recommended)  Meds to D/C from list Atorvastatin 40mg  ***S58mULE FOLLOW UP WITH HENRY***

## 2020-05-23 ENCOUNTER — Other Ambulatory Visit: Payer: Self-pay

## 2020-05-23 ENCOUNTER — Ambulatory Visit: Payer: Medicare HMO | Admitting: Cardiology

## 2020-05-23 ENCOUNTER — Encounter: Payer: Self-pay | Admitting: Cardiology

## 2020-05-23 VITALS — BP 130/70 | HR 51 | Temp 97.0°F | Ht 65.0 in | Wt 176.2 lb

## 2020-05-23 DIAGNOSIS — I10 Essential (primary) hypertension: Secondary | ICD-10-CM | POA: Diagnosis not present

## 2020-05-23 DIAGNOSIS — I2510 Atherosclerotic heart disease of native coronary artery without angina pectoris: Secondary | ICD-10-CM | POA: Diagnosis not present

## 2020-05-23 DIAGNOSIS — E782 Mixed hyperlipidemia: Secondary | ICD-10-CM

## 2020-05-23 DIAGNOSIS — Z794 Long term (current) use of insulin: Secondary | ICD-10-CM | POA: Diagnosis not present

## 2020-05-23 DIAGNOSIS — E118 Type 2 diabetes mellitus with unspecified complications: Secondary | ICD-10-CM

## 2020-05-23 DIAGNOSIS — Z7189 Other specified counseling: Secondary | ICD-10-CM | POA: Diagnosis not present

## 2020-05-23 NOTE — Patient Instructions (Signed)

## 2020-05-23 NOTE — Progress Notes (Signed)
Cardiology Office Note:    Date:  05/23/2020   ID:  [name], DOB 1946/07/26, MRN [id number]  PCP:  Carollee Herter, Alferd Apa, DO  Cardiologist:  Buford Dresser, MD PhD  Referring MD: Carollee Herter, Alferd Apa, *   CC: follow up  History of Present Illness:    [name] is a 74 y.o. female with a hx of CAD s/p stent(s), type II diabetes on insulin, hyperlipidemia, hypertension who is seen for follow up today. She was initially seen 07/2018 as a new consult at the request of Ann Held, * for the evaluation and management of chest pain.  Cardiac history: history of cardiac stent(s)--not sure where, what type, or how many. Last cath in 2001 by Dr. Naomie Dean. She denies any symptoms at the time.  Had echo and lexiscan in 2017 when she was having fatigue and diaphoresis. No chest discomfort or shortness of breath at that time.   Risk factors: -Alcohol: none -Tobacco: sometimes; about a pack per week -Comorbidities: hypertension, hyperlipidemia, type II diabetes -Exercise level: sedentary many days, still works part time with minimal activity at work. Has intermittent activity, like 2 weeks ago when she helped her children cook for a family party. Doesn't think she can treadmill due to chronic knee pain. Does occasionally climb stairs.  -Family history: brother and father both died of MI.  Today: Doing well overall. No chest pain. Blood pressure well controlled today. Checks occasionally at home, has been in a similar range.  Denies chest pain, shortness of breath at rest or with normal exertion. No PND, orthopnea, LE edema or unexpected weight gain. No syncope or palpitations.  Past Medical History:  Diagnosis Date  . Blockage of coronary artery of heart ([place])   . Depression   . Diabetes mellitus   . Hyperlipidemia   . Hypertension   . Pancreatitis   . Restless leg syndrome     Past Surgical History:  Procedure Laterality Date  . CATARACT EXTRACTION,  BILATERAL    . CORONARY ANGIOPLASTY WITH STENT PLACEMENT    . FOOT SURGERY Right    cyst removal  . ROTATOR CUFF REPAIR     right  . ROTATOR CUFF REPAIR Left   . TUBAL LIGATION      Current Medications: Current Outpatient Medications on File Prior to Visit  Medication Sig  . Alcohol Swabs (B-D SINGLE USE SWABS REGULAR) PADS USE AS DIRECTED  . alendronate (FOSAMAX) 70 MG tablet TAKE 1 TABLET BY MOUTH EVERY WEEK WITH FULL GLASS OF WATER ON AN EMPTY STOMACH  . ALPRAZolam (XANAX) 0.25 MG tablet Take 1 tablet by mouth three times a day if needed  . amLODipine (NORVASC) 10 MG tablet Take 1 tablet (10 mg total) by mouth daily.  [name] aspirin EC 81 MG tablet Take 81 mg by mouth daily.  [name] atorvastatin (LIPITOR) 40 MG tablet TAKE 1 TABLET(40 MG) BY MOUTH DAILY  . benzonatate (TESSALON) 100 MG capsule Take 1 capsule (100 mg total) by mouth 3 (three) times daily as needed for cough.  . Blood Glucose Calibration (TAI DOC CONTROL) NORMAL SOLN   . Blood Glucose Monitoring Suppl (TRUE METRIX AIR GLUCOSE METER) w/Device KIT 1 each by Does not apply route 4 (four) times daily. DX CODE E11.8  . Carboxymethylcellulose Sodium (THERATEARS OP) Place 1 drop into both eyes daily as needed (dry eyes).  . fenofibrate 160 MG tablet Take 1 tablet (160 mg total) by mouth daily.  [name] FERRETTS  325 (106 Fe) MG TABS tablet Take 1 tablet by mouth daily.  . ferrous sulfate 325 (65 FE) MG tablet Take 325 mg by mouth daily with breakfast.  . Flaxseed, Linseed, (FLAXSEED OIL PO) Take 1 capsule by mouth 2 (two) times daily.   [name] FLUoxetine (PROZAC) 20 MG capsule TAKE 1 CAPSULE EVERY DAY  . fluticasone (FLONASE) 50 MCG/ACT nasal spray Place 2 sprays into both nostrils daily as needed for allergies or rhinitis.  [name] gabapentin (NEURONTIN) 300 MG capsule Take 1 capsule (300 mg total) by mouth 2 (two) times daily. If tolerating may take 3 times per day if needed.  [name] glucose blood (TRUE METRIX BLOOD GLUCOSE TEST) test strip USE AS DIRECTED  4 TIMES A DAY  . insulin regular (NOVOLIN R RELION) 100 units/mL injection Inject 0.3 mLs (30 Units total) into the skin daily as needed for high blood sugar (at noon if CBG >200 and will be eating).  . Insulin Syringe-Needle U-100 (BD INSULIN SYRINGE U/F) 31G X 5/16" 0.3 ML MISC 10 Units by Does not apply route 3 (three) times daily before meals.  Elmore Guise Devices (LANCING DEVICE) MISC   . lisinopril (ZESTRIL) 40 MG tablet Take 1 tablet (40 mg total) by mouth daily.  . metoprolol succinate (TOPROL-XL) 50 MG 24 hr tablet Take 1 tablet (50 mg total) by mouth daily. with food  . Multiple Vitamin (MULTIVITAMIN WITH MINERALS) TABS Take 1 tablet by mouth daily.  [name] neomycin-polymyxin-hydrocortisone (CORTISPORIN) 3.5-10000-1 OTIC suspension Place 4 drops into the left ear 3 (three) times daily as needed.  [name] NOVOLIN N RELION 100 UNIT/ML injection Inject 30-60 Units into the skin 2 (two) times daily. Inject 40 units subcutaneously before breakfast and 60 units at bedtime  . Omega-3 Fatty Acids (FISH OIL) 1000 MG CAPS Take by mouth.  . rosuvastatin (CRESTOR) 20 MG tablet Take 1 tablet (20 mg total) by mouth daily.  . TRUEplus Lancets 33G MISC USE AS DIRECTED 4 TIMES A DAY  . vitamin E 400 UNIT capsule Take 400 Units by mouth daily.   No current facility-administered medications on file prior to visit.     Allergies:   Levemir [insulin detemir] and Metformin and related   Social History   Tobacco Use  . Smoking status: Former Smoker    Packs/day: 0.20    Types: Cigarettes    Quit date: 05/05/2020    Years since quitting: 0.0  . Smokeless tobacco: Never Used  Vaping Use  . Vaping Use: Never used  Substance Use Topics  . Alcohol use: No  . Drug use: No    Family History: The patient's family history includes Cancer in her mother and sister; Cirrhosis in her sister; Diabetes in her daughter, daughter, sister, sister, and son; Goiter in her daughter; Gout in her sister; Heart Problems in her  sister; Heart attack in her brother, father, and son; Hypertension in her mother and sister; Sleep apnea in her sister; Vision loss in her mother.  ROS:   Please see the history of present illness.  Additional pertinent ROS negative except as documented.  EKGs/Labs/Other Studies Reviewed:    The following studies were reviewed today: 02/19/16 Echo Study Conclusions  - Left ventricle: The cavity size was normal. There was mild   concentric hypertrophy. Systolic function was normal. The   estimated ejection fraction was in the range of 60% to 65%. Wall   motion was normal; there were no regional wall motion   abnormalities. Doppler parameters are consistent  with abnormal   left ventricular relaxation (grade 1 diastolic dysfunction).   Doppler parameters are consistent with elevated ventricular   end-diastolic filling pressure. - Aortic valve: There was no regurgitation. - Aortic root: The aortic root was normal in size. - Mitral valve: Structurally normal valve. There was mild   regurgitation. - Left atrium: The atrium was normal in size. - Right ventricle: The cavity size was normal. Wall thickness was   normal. Systolic function was normal. - Tricuspid valve: There was mild regurgitation. - Pulmonary arteries: Systolic pressure was within the normal   range. - Inferior vena cava: The vessel was normal in size. - Pericardium, extracardiac: There was no pericardial effusion.  02/19/16 NM Stress test-Lexiscan FINDINGS: Perfusion: Allowing for anterior apical breast attenuation, there is No significant decreased activity in the left ventricle on stress imaging to suggest reversible ischemia or infarction.  Wall Motion: Normal left ventricular wall motion. No left ventricular dilation.  Left Ventricular Ejection Fraction: 58 % End diastolic volume 83 m End systolic volume 35 ml  IMPRESSION: 1. No reversible ischemia or infarction. 2. Normal left ventricular wall  motion. 3. Left ventricular ejection fraction 58% 4. Low-risk stress test findings*.  EKG:  ECG personally reviewed. ECG dated 10/28/19 shows sinus bradycardia, RBBB, PRWP, borderline LAFB  Recent Labs: 03/10/2020: ALT 18; BUN 24; Creatinine, Ser 1.20; Potassium 4.0; Sodium 138  Recent Lipid Panel    Component Value Date/Time   CHOL 171 03/10/2020 1410   TRIG 204.0 (H) 03/10/2020 1410   HDL 37.00 (L) 03/10/2020 1410   CHOLHDL 5 03/10/2020 1410   VLDL 40.8 (H) 03/10/2020 1410   LDLCALC 78 07/28/2019 1450   LDLDIRECT 100.0 03/10/2020 1410    Physical Exam:    VS:  BP 130/70   Pulse (!) 51   Temp (!) 97 F (36.1 C)   Ht 5' 5" (1.651 m)   Wt 176 lb 4 oz (79.9 kg)   SpO2 98%   BMI 29.33 kg/m     Wt Readings from Last 3 Encounters:  05/23/20 176 lb 4 oz (79.9 kg)  05/17/20 175 lb (79.4 kg)  03/10/20 176 lb 3.2 oz (79.9 kg)    GEN: Well nourished, well developed in no acute distress HEENT: Normal, moist mucous membranes NECK: No JVD CARDIAC: regular rhythm, normal S1 and S2, no rubs or gallops. No murmur. VASCULAR: Radial and DP pulses 2+ bilaterally. No carotid bruits RESPIRATORY:  Clear to auscultation without rales, wheezing or rhonchi  ABDOMEN: Soft, non-tender, non-distended MUSCULOSKELETAL:  Ambulates independently SKIN: Warm and dry, no edema NEUROLOGIC:  Alert and oriented x 3. No focal neuro deficits noted. PSYCHIATRIC:  Normal affect   ASSESSMENT:    1. CAD in native artery   2. Mixed hyperlipidemia   3. Type 2 diabetes mellitus with complication, with long-term current use of insulin ([place])   4. Essential hypertension   5. Counseling on health promotion and disease prevention   6. Cardiac risk counseling    PLAN:    History of CAD with stents, mixed hyperlipidemia: no angina today -continue aspirin 81 mg, atorvastatin 40 mg. -LDL goal <70 -per [facility], lipids 03/10/20: Tchol 171, HDL 37, LDL 100, TG 204. Changed from atorvastatin 40 mg to rosuvastatin 20 mg  after these labs. Has a lab appt 06/15/20 to recheck.  -prior check was LDL 78. TG 173.  Type II diabetes: with ASCVD history -most recent A1c 9.4, from 10.1 -we discussed SGLT2i today. I believe she would get significant  benefit from this. We discussed Coordinate-diabetes today, she is amenable.  Hypertension: -on amlodipine 10 mg, lisinopril 40 mg, metoprolol succinate 50 mg  CV risk counseling and secondary prevention: -recommend heart healthy/Mediterranean diet, with whole grains, fruits, vegetable, fish, lean meats, nuts, and olive oil. Limit salt. -recommend moderate walking, 3-5 times/week for 30-50 minutes each session. Aim for at least 150 minutes.week. Goal should be pace of 3 miles/hours, or walking 1.5 miles in 30 minutes -recommend avoidance of tobacco products. Avoid excess alcohol.  Plan for follow up: 6 mos or sooner as needed  Medication Adjustments/Labs and Tests Ordered: Current medicines are reviewed at length with the patient today.  Concerns regarding medicines are outlined above.  No orders of the defined types were placed in this encounter.  No orders of the defined types were placed in this encounter.   Patient Instructions  Medication Instructions:  Your Physician recommend you continue on your current medication as directed.    *If you need a refill on your cardiac medications before your next appointment, please call your pharmacy*   Lab Work: None   Testing/Procedures: None   Follow-Up: At [facility], you and your health needs are our priority.  As part of our continuing mission to provide you with exceptional heart care, we have created designated Provider Care Teams.  These Care Teams include your primary Cardiologist (physician) and Advanced Practice Providers (APPs -  Physician Assistants and Nurse Practitioners) who all work together to provide you with the care you need, when you need it.  We recommend signing up for the patient portal  called "MyChart".  Sign up information is provided on this After Visit Summary.  MyChart is used to connect with patients for Virtual Visits (Telemedicine).  Patients are able to view lab/test results, encounter notes, upcoming appointments, etc.  Non-urgent messages can be sent to your provider as well.   To learn more about what you can do with MyChart, go to [email or web address].    Your next appointment:   6 month(s)  The format for your next appointment:   In Person  Provider:   Buford Dresser, MD      Signed, Buford Dresser, MD PhD 05/23/2020  [place]

## 2020-05-24 ENCOUNTER — Ambulatory Visit: Payer: Medicare HMO | Admitting: Family Medicine

## 2020-05-24 ENCOUNTER — Ambulatory Visit (INDEPENDENT_AMBULATORY_CARE_PROVIDER_SITE_OTHER): Payer: Medicare HMO | Admitting: Family Medicine

## 2020-05-24 ENCOUNTER — Telehealth: Payer: Self-pay | Admitting: *Deleted

## 2020-05-24 VITALS — BP 129/50 | HR 52 | Temp 98.3°F | Resp 12 | Ht 65.0 in | Wt 174.2 lb

## 2020-05-24 DIAGNOSIS — Z006 Encounter for examination for normal comparison and control in clinical research program: Secondary | ICD-10-CM

## 2020-05-24 DIAGNOSIS — H6121 Impacted cerumen, right ear: Secondary | ICD-10-CM | POA: Diagnosis not present

## 2020-05-24 NOTE — Patient Instructions (Signed)
Earwax Buildup, Adult The ears produce a substance called earwax that helps keep bacteria out of the ear and protects the skin in the ear canal. Occasionally, earwax can build up in the ear and cause discomfort or hearing loss. What increases the risk? This condition is more likely to develop in people who:  Are female.  Are elderly.  Naturally produce more earwax.  Clean their ears often with cotton swabs.  Use earplugs often.  Use in-ear headphones often.  Wear hearing aids.  Have narrow ear canals.  Have earwax that is overly thick or sticky.  Have eczema.  Are dehydrated.  Have excess hair in the ear canal. What are the signs or symptoms? Symptoms of this condition include:  Reduced or muffled hearing.  A feeling of fullness in the ear or feeling that the ear is plugged.  Fluid coming from the ear.  Ear pain.  Ear itch.  Ringing in the ear.  Coughing.  An obvious piece of earwax that can be seen inside the ear canal. How is this diagnosed? This condition may be diagnosed based on:  Your symptoms.  Your medical history.  An ear exam. During the exam, your health care provider will look into your ear with an instrument called an otoscope. You may have tests, including a hearing test. How is this treated? This condition may be treated by:  Using ear drops to soften the earwax.  Having the earwax removed by a health care provider. The health care provider may: ? Flush the ear with water. ? Use an instrument that has a loop on the end (curette). ? Use a suction device.  Surgery to remove the wax buildup. This may be done in severe cases. Follow these instructions at home:   Take over-the-counter and prescription medicines only as told by your health care provider.  Do not put any objects, including cotton swabs, into your ear. You can clean the opening of your ear canal with a washcloth or facial tissue.  Follow instructions from your health care  provider about cleaning your ears. Do not over-clean your ears.  Drink enough fluid to keep your urine clear or pale yellow. This will help to thin the earwax.  Keep all follow-up visits as told by your health care provider. If earwax builds up in your ears often or if you use hearing aids, consider seeing your health care provider for routine, preventive ear cleanings. Ask your health care provider how often you should schedule your cleanings.  If you have hearing aids, clean them according to instructions from the manufacturer and your health care provider. Contact a health care provider if:  You have ear pain.  You develop a fever.  You have blood, pus, or other fluid coming from your ear.  You have hearing loss.  You have ringing in your ears that does not go away.  Your symptoms do not improve with treatment.  You feel like the room is spinning (vertigo). Summary  Earwax can build up in the ear and cause discomfort or hearing loss.  The most common symptoms of this condition include reduced or muffled hearing and a feeling of fullness in the ear or feeling that the ear is plugged.  This condition may be diagnosed based on your symptoms, your medical history, and an ear exam.  This condition may be treated by using ear drops to soften the earwax or by having the earwax removed by a health care provider.  Do not put any   objects, including cotton swabs, into your ear. You can clean the opening of your ear canal with a washcloth or facial tissue. This information is not intended to replace advice given to you by your health care provider. Make sure you discuss any questions you have with your health care provider. Document Revised: 09/06/2017 Document Reviewed: 12/05/2016 Elsevier Patient Education  2020 Elsevier Inc.  

## 2020-05-24 NOTE — Assessment & Plan Note (Addendum)
Pt has been using debrox--- L ear now clear  R still impacted Unable to remove with hoop--- irrigated  Tm clear and hearing normal  F/u prn

## 2020-05-24 NOTE — Progress Notes (Signed)
Patient ID: [name], female    DOB: 18-Jun-1946  Age: 74 y.o. MRN: [id number]    Subjective:  Subjective  HPI [name] presents for f/u cerumen impaction.  She has been using the debrox  Review of Systems  Constitutional: Negative for appetite change, diaphoresis, fatigue and unexpected weight change.  HENT: Positive for ear pain and hearing loss.   Eyes: Negative for pain, redness and visual disturbance.  Respiratory: Negative for cough, chest tightness, shortness of breath and wheezing.   Cardiovascular: Negative for chest pain, palpitations and leg swelling.  Endocrine: Negative for cold intolerance, heat intolerance, polydipsia, polyphagia and polyuria.  Genitourinary: Negative for difficulty urinating, dysuria and frequency.  Neurological: Negative for dizziness, light-headedness, numbness and headaches.    History Past Medical History:  Diagnosis Date  . Blockage of coronary artery of heart ([place])   . Depression   . Diabetes mellitus   . Hyperlipidemia   . Hypertension   . Pancreatitis   . Restless leg syndrome     She has a past surgical history that includes Tubal ligation; Rotator cuff repair; Coronary angioplasty with stent; Foot surgery (Right); Cataract extraction, bilateral; and Rotator cuff repair (Left).   Her family history includes Cancer in her mother and sister; Cirrhosis in her sister; Diabetes in her daughter, daughter, sister, sister, and son; Goiter in her daughter; Gout in her sister; Heart Problems in her sister; Heart attack in her brother, father, and son; Hypertension in her mother and sister; Sleep apnea in her sister; Vision loss in her mother.She reports that she quit smoking about 2 weeks ago. Her smoking use included cigarettes. She smoked 0.20 packs per day. She has never used smokeless tobacco. She reports that she does not drink alcohol and does not use drugs.  Current Outpatient Medications on File Prior to Visit  Medication Sig  Dispense Refill  . Alcohol Swabs (B-D SINGLE USE SWABS REGULAR) PADS USE AS DIRECTED 400 each 1  . alendronate (FOSAMAX) 70 MG tablet TAKE 1 TABLET BY MOUTH EVERY WEEK WITH FULL GLASS OF WATER ON AN EMPTY STOMACH 12 tablet 1  . ALPRAZolam (XANAX) 0.25 MG tablet Take 1 tablet by mouth three times a day if needed 60 tablet 0  . amLODipine (NORVASC) 10 MG tablet Take 1 tablet (10 mg total) by mouth daily. 90 tablet 1  . aspirin EC 81 MG tablet Take 81 mg by mouth daily.    . benzonatate (TESSALON) 100 MG capsule Take 1 capsule (100 mg total) by mouth 3 (three) times daily as needed for cough. 30 capsule 0  . Blood Glucose Calibration (TAI DOC CONTROL) NORMAL SOLN     . Blood Glucose Monitoring Suppl (TRUE METRIX AIR GLUCOSE METER) w/Device KIT 1 each by Does not apply route 4 (four) times daily. DX CODE E11.8 1 kit 0  . Carboxymethylcellulose Sodium (THERATEARS OP) Place 1 drop into both eyes daily as needed (dry eyes).    . fenofibrate 160 MG tablet Take 1 tablet (160 mg total) by mouth daily. 90 tablet 1  . FERRETTS 325 (106 Fe) MG TABS tablet Take 1 tablet by mouth daily.    . ferrous sulfate 325 (65 FE) MG tablet Take 325 mg by mouth daily with breakfast.    . Flaxseed, Linseed, (FLAXSEED OIL PO) Take 1 capsule by mouth 2 (two) times daily.     [name] FLUoxetine (PROZAC) 20 MG capsule TAKE 1 CAPSULE EVERY DAY 90 capsule 1  . fluticasone (FLONASE) 50  MCG/ACT nasal spray Place 2 sprays into both nostrils daily as needed for allergies or rhinitis.    [name] gabapentin (NEURONTIN) 300 MG capsule Take 1 capsule (300 mg total) by mouth 2 (two) times daily. If tolerating may take 3 times per day if needed. 90 capsule 0  . glucose blood (TRUE METRIX BLOOD GLUCOSE TEST) test strip USE AS DIRECTED 4 TIMES A DAY 400 strip 1  . insulin regular (NOVOLIN R RELION) 100 units/mL injection Inject 0.3 mLs (30 Units total) into the skin daily as needed for high blood sugar (at noon if CBG >200 and will be eating). 10 mL   .  Insulin Syringe-Needle U-100 (BD INSULIN SYRINGE U/F) 31G X 5/16" 0.3 ML MISC 10 Units by Does not apply route 3 (three) times daily before meals. 100 each 0  . Lancet Devices (LANCING DEVICE) MISC     . lisinopril (ZESTRIL) 40 MG tablet Take 1 tablet (40 mg total) by mouth daily. 90 tablet 3  . metoprolol succinate (TOPROL-XL) 50 MG 24 hr tablet Take 1 tablet (50 mg total) by mouth daily. with food 90 tablet 0  . Multiple Vitamin (MULTIVITAMIN WITH MINERALS) TABS Take 1 tablet by mouth daily.    [name] neomycin-polymyxin-hydrocortisone (CORTISPORIN) 3.5-10000-1 OTIC suspension Place 4 drops into the left ear 3 (three) times daily as needed.    [name] NOVOLIN N RELION 100 UNIT/ML injection Inject 30-60 Units into the skin 2 (two) times daily. Inject 40 units subcutaneously before breakfast and 60 units at bedtime    . Omega-3 Fatty Acids (FISH OIL) 1000 MG CAPS Take by mouth.    . rosuvastatin (CRESTOR) 20 MG tablet Take 1 tablet (20 mg total) by mouth daily. 30 tablet 2  . TRUEplus Lancets 33G MISC USE AS DIRECTED 4 TIMES A DAY 400 each 1  . vitamin E 400 UNIT capsule Take 400 Units by mouth daily.     No current facility-administered medications on file prior to visit.     Objective:  Objective  Physical Exam Vitals and nursing note reviewed.  Constitutional:      Appearance: She is well-developed.  HENT:     Head: Normocephalic and atraumatic.     Right Ear: There is impacted cerumen.     Left Ear: Tympanic membrane, ear canal and external ear normal.     Ears:     Comments: r ear irrigated ---  Tm clear  Hearing checked and is normal  Eyes:     Conjunctiva/sclera: Conjunctivae normal.  Neck:     Thyroid: No thyromegaly.     Vascular: No carotid bruit or JVD.  Cardiovascular:     Rate and Rhythm: Normal rate and regular rhythm.     Heart sounds: Normal heart sounds. No murmur heard.   Pulmonary:     Effort: Pulmonary effort is normal. No respiratory distress.     Breath sounds: Normal  breath sounds. No wheezing or rales.  Chest:     Chest wall: No tenderness.  Musculoskeletal:     Cervical back: Normal range of motion and neck supple.  Neurological:     Mental Status: She is alert and oriented to person, place, and time.    BP (!) 129/50 (BP Location: Right Arm, Patient Position: Sitting, Cuff Size: Small)   Pulse (!) 52   Temp 98.3 F (36.8 C) (Oral)   Resp 12   Ht 5' 5" (1.651 m)   Wt 174 lb 3.2 oz (79 kg)   SpO2  100%   BMI 28.99 kg/m  Wt Readings from Last 3 Encounters:  05/24/20 174 lb 3.2 oz (79 kg)  05/23/20 176 lb 4 oz (79.9 kg)  05/17/20 175 lb (79.4 kg)     Lab Results  Component Value Date   WBC 11.2 (H) 08/11/2018   HGB 12.3 08/11/2018   HCT 36.9 08/11/2018   PLT 221.0 08/11/2018   GLUCOSE 138 (H) 03/10/2020   CHOL 171 03/10/2020   TRIG 204.0 (H) 03/10/2020   HDL 37.00 (L) 03/10/2020   LDLDIRECT 100.0 03/10/2020   LDLCALC 78 07/28/2019   ALT 18 03/10/2020   AST 18 03/10/2020   NA 138 03/10/2020   K 4.0 03/10/2020   CL 101 03/10/2020   CREATININE 1.20 03/10/2020   BUN 24 (H) 03/10/2020   CO2 29 03/10/2020   TSH 0.58 08/11/2018   INR 1.1 03/03/2009   HGBA1C 9.4 (H) 03/10/2020   MICROALBUR 27.3 (H) 07/28/2019    US THYROID  Result Date: 02/16/2020 CLINICAL DATA:  Nontoxic goiter. Previous FNA biopsy inferior left and inferior right nodules 03/14/2009 EXAM: THYROID ULTRASOUND TECHNIQUE: Ultrasound examination of the thyroid gland and adjacent soft tissues was performed. COMPARISON:  08/11/2018 and previous FINDINGS: Parenchymal Echotexture: Markedly heterogenous Isthmus: 1 cm thickness, previously 1.2 Right lobe: 7.6 x 3.3 x 3.8 cm, previously 7.3 x 3.3 x 3.4 Left lobe: 9 x 4.1 x 4.9 cm, previously 10 x 4.7 x 5 _________________________________________________________ Estimated total number of nodules >/= 1 cm: 2 Number of spongiform nodules >/=  2 cm not described below (TR1): 0 Number of mixed cystic and solid nodules >/= 1.5 cm not  described below ([place]): 0 _________________________________________________________ Nodule # 1: Prior biopsy: No Location: Isthmus; Maximum size: 1.7 cm; Other 2 dimensions: 1.3 x 1.3 cm, previously, 1.4 x 1.1 x 1.1 cm Composition: solid/almost completely solid (2) Echogenicity: isoechoic (1) Shape: not taller-than-wide (0) Margins: smooth (0) Echogenic foci: none (0) ACR TI-RADS total points: 3. ACR TI-RADS risk category:  TR3 (3 points). Significant change in size (>/= 20% in two dimensions and minimal increase of 2 mm): Yes Change in features: No Change in ACR TI-RADS risk category: No ACR TI-RADS recommendations: *Given size (>/= 1.5 - 2.4 cm) and appearance, a follow-up ultrasound in 1 year should be considered based on TI-RADS criteria. _________________________________________________________ Nodule # 2: 4.4 x 2.6 x 3.6 cm inferior right, previously 4.7 x 3 x 3.4; this was previously biopsied Background parenchyma is diffusely nodular without any other discrete well-marginated mass. 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 No current indication for biopsy or dedicated imaging follow-up. The above is in keeping with the ACR TI-RADS recommendations - J Am Coll Radiol 2017;14:587-595. Electronically Signed   By: Lucrezia Europe M.D.   On: 02/16/2020 15:44     Assessment & Plan:  Plan  I am having [name] maintain her NovoLIN N ReliOn, multivitamin with minerals, Lancing Device, Tai Doc Control, aspirin EC, (Flaxseed, Linseed, (FLAXSEED OIL PO)), ferrous sulfate, vitamin E, Carboxymethylcellulose Sodium (THERATEARS OP), insulin regular, True Metrix Air Glucose Meter, True Metrix Blood Glucose Test, TRUEplus Lancets 33G, fenofibrate, fluticasone, neomycin-polymyxin-hydrocortisone, lisinopril, gabapentin, Insulin Syringe-Needle U-100, alendronate, amLODipine, FLUoxetine, B-D SINGLE USE SWABS REGULAR, rosuvastatin, metoprolol succinate, ALPRAZolam, benzonatate, Ferretts, and Fish  Oil.  No orders of the defined types were placed in this encounter.   Problem List Items Addressed This Visit      Unprioritized   Impacted cerumen of right ear - Primary    Pt has  been using debrox--- L ear now clear  R still impacted Unable to remove with hoop--- irrigated  Tm clear and hearing normal  F/u prn          Follow-up: Return if symptoms worsen or fail to improve.  Ann Held, DO

## 2020-05-24 NOTE — Telephone Encounter (Signed)
Subject Name: [name]  Subject met inclusion and exclusion criteria.  The informed consent form, study requirements and expectations were reviewed with the subject and questions and concerns were addressed prior to the signing of the consent form.  The subject verbalized understanding of the trial requirements.  The subject agreed to participate in the Coordinate DM trial and signed the informed consent at 1647 on 05/24/2020 The informed consent was obtained prior to performance of any protocol-specific procedures for the subject.  A copy of the signed informed consent was given to the subject and a copy was placed in the subject's medical record.    Informed consent was obtained verbally via phone. Copy of informed consent mailed to address on file.   Pelletier, Erika Dawn   

## 2020-05-25 ENCOUNTER — Encounter: Payer: Self-pay | Admitting: Family Medicine

## 2020-05-25 ENCOUNTER — Telehealth: Payer: Medicare HMO

## 2020-05-25 NOTE — Chronic Care Management (AMB) (Deleted)
Chronic Care Management Pharmacy  Name: [name]  MRN: [id number] DOB: [birth year]  Chief Complaint/ HPI  [name],  74 y.o. , female presents for their Initial CCM visit with the clinical pharmacist via telephone due to COVID-19 Pandemic.  PCP : Ann Held, DO  Their chronic conditions include: {CHL AMB CHRONIC MEDICAL CONDITIONS:[phone]}  Office Visits: 05/17/20: Visit w/ Dr. Etter Sjogren - Cerumen impaction. Unable to remove w/ cerumen loop. Use debrox and rto in few days. Tessalon pearls for cough. RTC 1 week.   05/12/20: Medicare Annual Wellness Exam w/ [name], RN -  Goal updated to start silver sneakers and stop smoking.  03/10/20: Visit w/ Dr. Etter Sjogren - Atorvastatin changed to rosuvastatin 47m daily  Consult Visit: 02/10/20: Optometry visit w/ [name]  02/08/20: Endo visit w/ Dr. BChalmers Cater [place] Gave- Increase lisinopril to 476m Would like patient to be on SGLT2 or GLP1RA  12/02/19: Cardio visit w/ Dr. ChHarrell Gave Increase lisinopril to 2048maily    Medications: Outpatient Encounter Medications as of 05/25/2020  Medication Sig  . Alcohol Swabs (B-D SINGLE USE SWABS REGULAR) PADS USE AS DIRECTED  . alendronate (FOSAMAX) 70 MG tablet TAKE 1 TABLET BY MOUTH EVERY WEEK WITH FULL GLASS OF WATER ON AN EMPTY STOMACH  . ALPRAZolam (XANAX) 0.25 MG tablet Take 1 tablet by mouth three times a Shearon Clonch if needed  . amLODipine (NORVASC) 10 MG tablet Take 1 tablet (10 mg total) by mouth daily.  . aMarland Kitchenpirin EC 81 MG tablet Take 81 mg by mouth daily.  . benzonatate (TESSALON) 100 MG capsule Take 1 capsule (100 mg total) by mouth 3 (three) times daily as needed for cough.  . Blood Glucose Calibration (TAI DOC CONTROL) NORMAL SOLN   . Blood Glucose Monitoring Suppl (TRUE METRIX AIR GLUCOSE METER) w/Device KIT 1 each by Does not apply route 4 (four) times daily. DX CODE E11.8  . Carboxymethylcellulose Sodium (THERATEARS OP) Place 1  drop into both eyes daily as needed (dry eyes).  . fenofibrate 160 MG tablet Take 1 tablet (160 mg total) by mouth daily.  . FMarland KitchenRRETTS 325 (106 Fe) MG TABS tablet Take 1 tablet by mouth daily.  . ferrous sulfate 325 (65 FE) MG tablet Take 325 mg by mouth daily with breakfast.  . Flaxseed, Linseed, (FLAXSEED OIL PO) Take 1 capsule by mouth 2 (two) times daily.   . FMarland KitchenUoxetine (PROZAC) 20 MG capsule TAKE 1 CAPSULE EVERY Beretta Ginsberg  . fluticasone (FLONASE) 50 MCG/ACT nasal spray Place 2 sprays into both nostrils daily as needed for allergies or rhinitis.  . gMarland Kitchenbapentin (NEURONTIN) 300 MG capsule Take 1 capsule (300 mg total) by mouth 2 (two) times daily. If tolerating may take 3 times per Nevae Pinnix if needed.  . gMarland Kitchenucose blood (TRUE METRIX BLOOD GLUCOSE TEST) test strip USE AS DIRECTED 4 TIMES A Fain Francis  . insulin regular (NOVOLIN R RELION) 100 units/mL injection Inject 0.3 mLs (30 Units total) into the skin daily as needed for high blood sugar (at noon if CBG >200 and will be eating).  . Insulin Syringe-Needle U-100 (BD INSULIN SYRINGE U/F) 31G X 5/16" 0.3 ML MISC 10 Units by Does not apply route 3 (three) times daily before meals.  . LElmore Guisevices (LANCING DEVICE) MISC   . lisinopril (ZESTRIL) 40 MG tablet Take 1 tablet (40 mg total) by mouth daily.  . metoprolol succinate (TOPROL-XL) 50 MG 24 hr tablet Take 1 tablet (50 mg total)  by mouth daily. with food  . Multiple Vitamin (MULTIVITAMIN WITH MINERALS) TABS Take 1 tablet by mouth daily.  [name] neomycin-polymyxin-hydrocortisone (CORTISPORIN) 3.5-10000-1 OTIC suspension Place 4 drops into the left ear 3 (three) times daily as needed.  [name] NOVOLIN N RELION 100 UNIT/ML injection Inject 30-60 Units into the skin 2 (two) times daily. Inject 40 units subcutaneously before breakfast and 60 units at bedtime  . Omega-3 Fatty Acids (FISH OIL) 1000 MG CAPS Take by mouth.  . rosuvastatin (CRESTOR) 20 MG tablet Take 1 tablet (20 mg total) by mouth daily.  . TRUEplus Lancets 33G MISC  USE AS DIRECTED 4 TIMES A Merit Maybee  . vitamin E 400 UNIT capsule Take 400 Units by mouth daily.   No facility-administered encounter medications on file as of 05/25/2020.     Current Diagnosis/Assessment:  Goals Addressed   None    Hypertension   BP goal is:  <130/80 (per Cardio)  Office blood pressures are  BP Readings from Last 3 Encounters:  05/24/20 (!) 129/50  05/23/20 130/70  05/17/20 120/60   Patient checks BP at home {CHL HP BP Monitoring [phone]} Patient home BP readings are ranging: ***  Patient has failed these meds in the past: None noted  Patient is currently {CHL Controlled/[phone]} on the following medications:  . Amlodipine 39m daily . Lisinopril 426mdaily . Metoprolol Succinate 5062maily  We discussed {CHL HP Upstream Pharmacy discussion:[phone]}  Plan  Continue {CHL HP Upstream Pharmacy Plans:[phone]}     Diabetes   A1c goal <7%  Recent Relevant Labs: Lab Results  Component Value Date/Time   HGBA1C 9.4 (H) 03/10/2020 02:10 PM   HGBA1C 10.1 (H) 07/28/2019 02:50 PM   GFR 53.11 (L) 03/10/2020 02:10 PM   GFR 72.29 07/28/2019 02:50 PM   MICROALBUR 27.3 (H) 07/28/2019 02:50 PM   MICROALBUR 1.7 06/23/2015 10:21 AM    Last diabetic Eye exam: (noted April 2021) Lab Results  Component Value Date/Time   HMDIABEYEEXA Cataracts 07/08/2013 12:00 AM    Last diabetic Foot exam: No results found for: HMDIABFOOTEX (noted on 03/10/20 visit)  Checking BG: {CHL HP Blood Glucose Monitoring Frequency:[phone]}  Recent FBG Readings: *** Recent pre-meal BG readings: *** Recent 2hr PP BG readings:  *** Recent HS BG readings: ***  Patient has failed these meds in past: None noted  Patient is currently uncontrolled on the following medications: . Relion Novolin R 30 units daily as needed for high blood sugar (at noon if BG >200 and will be eating ) . Relion Novolin N 40 units before breakfast and 60 units before  bedtime  What happened to Lantus?  Humalog?  We discussed: {CHL HP Upstream Pharmacy discussion:[phone]}  Plan  Continue {CHL HP Upstream Pharmacy Plans:[phone]}  Hyperlipidemia/Hx of CAD   LDL goal <70  Lipid Panel     Component Value Date/Time   CHOL 171 03/10/2020 1410   TRIG 204.0 (H) 03/10/2020 1410   HDL 37.00 (L) 03/10/2020 1410   LDLCALC 78 07/28/2019 1450   LDLDIRECT 100.0 03/10/2020 1410    Hepatic Function Latest Ref Rng & Units 03/10/2020 07/28/2019 10/21/2018  Total Protein 6.0 - 8.3 g/dL 6.7 6.5 6.7  Albumin 3.5 - 5.2 g/dL 4.4 4.3 -  AST 0 - 37 U/L 18 18 -  ALT 0 - 35 U/L 18 20 -  Alk Phosphatase 39 - 117 U/L 39 62 -  Total Bilirubin 0.2 - 1.2 mg/dL 0.7 0.8 -  Bilirubin, Direct 0.0 - 0.3 mg/dL - - -  The 10-year ASCVD risk score Mikey Bussing DC Brooke Bonito., et al., 2013) is: 25.1%   Values used to calculate the score:     Age: 42 years     Sex: Female     Is Non-Hispanic African American: Yes     Diabetic: Yes     Tobacco smoker: No     Systolic Blood Pressure: 414 mmHg     Is BP treated: Yes     HDL Cholesterol: 37 mg/dL     Total Cholesterol: 171 mg/dL   Patient has failed these meds in past: atorvastatin (inefficacy?), lovaza (listed in D/C meds. No apparent reason for D/C) Patient is currently uncontrolled on the following medications:  . Aspirin 36m daily . Fenofibrate 1667mdaily . Flaxseed Oil 1 capsule twice daily  . Rosuvastatin 2030maily  We discussed:  {CHL HP Upstream Pharmacy discussion:[phone]}  Plan  Continue {CHL HP Upstream Pharmacy PlaELTRV:[id number]}Tobacco Abuse   Tobacco Status:  Social History   Tobacco Use  Smoking Status Former Smoker  . Packs/Nafeesah Lapaglia: 0.20  . Types: Cigarettes  . Quit date: 05/05/2020  . Years since quitting: 0.0  Smokeless Tobacco Never Used    Patient smokes {Time to first cigarette:23873} Patient triggers include: {Smoking Triggers:23882} On a scale of 1-10, reports MOTIVATION to quit is  *** On a scale of 1-10, reports CONFIDENCE in quitting is ***  Previous quit attempts included: *** Patient is currently {CHL Controlled/Uncontrolled:[id number]} on the following medications:  . ***  We discussed:  {Smoking Cessation Counseling:23883}  Plan  Continue {CHL HP Upstream Pharmacy PlaSHUOH:[id number]}Depression/Anxiety   [facility] 05/12/2020 05/11/2019 04/01/2017  Decreased Interest 0 0 0  Down, Depressed, Hopeless 0 0 0  PHQ - 2 Score 0 0 0  Some recent data might be hidden    Patient has failed these meds in past: sertraline (palpitations) Patient is currently {CHL Controlled/Uncontrolled:[id number]} on the following medications:  . Alprazolam 0.72m43mree times daily as needed . Fluoxetine 20mg32mly  We discussed:  ***  Plan  Continue {CHL HP Upstream Pharmacy Plans:[id number]}   Osteopenia / Osteoporosis   Last DEXA Scan: 05/29/2017  T-Score femoral neck: -2.0  T-Score forearm radius: -2.5  No results found for: VD25OH   Patient has failed these meds in past: None noted  Patient is currently {CHL Controlled/Uncontrolled:[id number]} on the following medications:  . Alendronate 70mg 31mly  We discussed:  {Osteoporosis Counseling:23892}  Plan -Complete DEXA Scan Continue {CHL HP Upstream Pharmacy Plans:3654001050}  Neuropathy    Patient has failed these meds in past: None noted  Patient is currently {CHL Controlled/Uncontrolled:[id number]} on the following medications: . Gabapentin 300mg t1m to three times daily  We discussed:  {CHL HP Upstream Pharmacy discussion:[phone]}  Plan  Continue {CHL HP Upstream Pharmacy Plans:[id number]}ellaneous Meds Benzonatate 100mg Th78mears Ferrous Sulfate 372mg Flu29msone 50mcg Mul1mtamin Cortisporin Vitamin E (Need to D/C if no recommended)  Meds to D/C from list Atorvastatin 40mg  ***S101mULE FOLLOW UP WITH HENRY***

## 2020-05-26 ENCOUNTER — Telehealth: Payer: Self-pay

## 2020-05-26 NOTE — Telephone Encounter (Signed)
-----   Message from Donato Schultz, DO sent at 05/24/2020  1:45 PM EDT ----- Pt sees endo Dr Talmage Nap--- she would need to be the one to put her on jardiance

## 2020-05-26 NOTE — Telephone Encounter (Signed)
Left detailed message with information from provider for patient to reach out to endo.

## 2020-06-04 ENCOUNTER — Other Ambulatory Visit: Payer: Self-pay | Admitting: Family Medicine

## 2020-06-06 ENCOUNTER — Telehealth: Payer: Self-pay | Admitting: Pharmacist

## 2020-06-06 NOTE — Progress Notes (Addendum)
Chronic Care Management Pharmacy Assistant   Name: [name]  MRN: [id number] DOB: Nov 01, 1945  Reason for Encounter: Initial Questions   PCP : Ann Held, DO  Allergies:   Allergies  Allergen Reactions  . Levemir [Insulin Detemir] Hives and Swelling  . Metformin And Related Other (See Comments)    Sick on stomach, sweaty, throw up, pain    Medications: Outpatient Encounter Medications as of 06/06/2020  Medication Sig  . Alcohol Swabs (B-D SINGLE USE SWABS REGULAR) PADS USE AS DIRECTED  . alendronate (FOSAMAX) 70 MG tablet TAKE 1 TABLET BY MOUTH EVERY WEEK WITH FULL GLASS OF WATER ON AN EMPTY STOMACH  . ALPRAZolam (XANAX) 0.25 MG tablet Take 1 tablet by mouth three times a day if needed  . amLODipine (NORVASC) 10 MG tablet Take 1 tablet (10 mg total) by mouth daily.  [name] aspirin EC 81 MG tablet Take 81 mg by mouth daily.  . benzonatate (TESSALON) 100 MG capsule Take 1 capsule (100 mg total) by mouth 3 (three) times daily as needed for cough.  . Blood Glucose Calibration (TAI DOC CONTROL) NORMAL SOLN   . Blood Glucose Monitoring Suppl (TRUE METRIX AIR GLUCOSE METER) w/Device KIT 1 each by Does not apply route 4 (four) times daily. DX CODE E11.8  . Carboxymethylcellulose Sodium (THERATEARS OP) Place 1 drop into both eyes daily as needed (dry eyes).  . fenofibrate 160 MG tablet Take 1 tablet (160 mg total) by mouth daily.  [name] FERRETTS 325 (106 Fe) MG TABS tablet Take 1 tablet by mouth daily.  . ferrous sulfate 325 (65 FE) MG tablet Take 325 mg by mouth daily with breakfast.  . Flaxseed, Linseed, (FLAXSEED OIL PO) Take 1 capsule by mouth 2 (two) times daily.   [name] FLUoxetine (PROZAC) 20 MG capsule TAKE 1 CAPSULE EVERY DAY  . fluticasone (FLONASE) 50 MCG/ACT nasal spray Place 2 sprays into both nostrils daily as needed for allergies or rhinitis.  [name] gabapentin (NEURONTIN) 300 MG capsule Take 1 capsule (300 mg total) by mouth 2 (two) times daily. If tolerating may take 3  times per day if needed.  [name] glucose blood (TRUE METRIX BLOOD GLUCOSE TEST) test strip USE AS DIRECTED 4 TIMES A DAY  . insulin regular (NOVOLIN R RELION) 100 units/mL injection Inject 0.3 mLs (30 Units total) into the skin daily as needed for high blood sugar (at noon if CBG >200 and will be eating).  . Insulin Syringe-Needle U-100 (BD INSULIN SYRINGE U/F) 31G X 5/16" 0.3 ML MISC 10 Units by Does not apply route 3 (three) times daily before meals.  Elmore Guise Devices (LANCING DEVICE) MISC   . lisinopril (ZESTRIL) 40 MG tablet Take 1 tablet (40 mg total) by mouth daily.  . metoprolol succinate (TOPROL-XL) 50 MG 24 hr tablet TAKE 1 TABLET(50 MG) BY MOUTH DAILY WITH FOOD  . Multiple Vitamin (MULTIVITAMIN WITH MINERALS) TABS Take 1 tablet by mouth daily.  [name] neomycin-polymyxin-hydrocortisone (CORTISPORIN) 3.5-10000-1 OTIC suspension Place 4 drops into the left ear 3 (three) times daily as needed.  [name] NOVOLIN N RELION 100 UNIT/ML injection Inject 30-60 Units into the skin 2 (two) times daily. Inject 40 units subcutaneously before breakfast and 60 units at bedtime  . Omega-3 Fatty Acids (FISH OIL) 1000 MG CAPS Take by mouth.  . rosuvastatin (CRESTOR) 20 MG tablet Take 1 tablet (20 mg total) by mouth daily.  . TRUEplus Lancets 33G MISC USE AS DIRECTED 4 TIMES A DAY  . vitamin  E 400 UNIT capsule Take 400 Units by mouth daily.   No facility-administered encounter medications on file as of 06/06/2020.    Current Diagnosis: Patient Active Problem List   Diagnosis Date Noted  . Impacted cerumen of right ear 05/24/2020  . Bilateral impacted cerumen 05/17/2020  . Hyperlipidemia associated with type 2 diabetes mellitus ([place]) 03/10/2020  . Cough 03/10/2020  . Seasonal allergies 03/10/2020  . Elevated rheumatoid factor 07/21/2018  . Gastroenteritis 07/21/2018  . Trigger middle finger of left hand 12/19/2017  . Ganglion cyst of dorsum of right wrist 12/19/2017  . Morton's neuroma of right foot 12/19/2017  .  Preventative health care 12/13/2017  . Pain in both hands 12/13/2017  . Hyperlipidemia LDL goal <100 12/13/2017  . Abscess of upper gum 12/13/2017  . Spinal stenosis of cervical region 09/25/2016  . Cervical radiculopathy 09/06/2016  . Dyspnea 02/18/2016  . Chest pain 02/18/2016  . HLD (hyperlipidemia) 02/18/2016  . CAD in native artery 02/18/2016  . RLS (restless legs syndrome) 02/18/2016  . Pancreatitis 02/18/2016  . SOB (shortness of breath) 02/18/2016  . Uncontrolled type 2 diabetes mellitus with complication ([place])   . Tobacco abuse   . Osteoarthritis of right knee 07/05/2015  . Smoking 07/02/2013  . Acute upper respiratory infections of unspecified site 07/02/2013  . Flu vaccine need 07/02/2013  . Multinodular goiter 04/16/2013  . Bunion 07/07/2012  . BACK PAIN, THORACIC REGION, RIGHT 05/24/2009  . IRON DEFICIENCY 03/25/2009  . GERD 03/25/2009  . WEIGHT LOSS 03/25/2009  . SINUSITIS- ACUTE-NOS 11/25/2008  . Hyperlipidemia LDL goal <70 10/07/2008  . ACUTE PANCREATITIS 09/07/2008  . OVARIAN CYST 09/07/2008  . KNEE PAIN, RIGHT 05/24/2008  . CARPAL TUNNEL SYNDROME, RIGHT 05/12/2008  . Acute pain of both shoulders 04/02/2008  . ROTATOR CUFF REPAIR, RIGHT, HX OF 03/29/2008  . GOITER, MULTINODULAR 12/26/2007  . Depression 07/07/2007  . HIP PAIN, RIGHT 03/11/2007  . RESTLESS LEG SYNDROME, HX OF 03/11/2007  . Diabetes mellitus type II, uncontrolled ([place]) 03/07/2007  . Essential hypertension 03/07/2007    Goals Addressed   None     Have you seen any other providers since your last visit? Yes  Any changes in your medications or health? No  Any side effects from any medications? Yes, patient states her Crestor makes her heart pound to where she can hear it beating heavy.  Do you have an symptoms or problems not managed by your medications? No  Any concerns about your health right now? Yes. Patient has questions about a medication she should be taking for DM. Patient  would like to know a tooth that she was told need to be extracted.  Has your provider asked that you check blood pressure, blood sugar, or follow special diet at home? Yes, patient states she is supposed to check her BG daily  Do you get any type of exercise on a regular basis? Yes, daily errands.  Can you think of a goal you would like to reach for your health? Yes, patient would like to get her blood sugar down to at least 150. She says it is always high.  Do you have any problems getting your medications? No  Is there anything that you would like to discuss during the appointment? Patient states she would like to discuss her white blood cell count.  Please bring medications and supplements to appointment   Follow-Up:  Pharmacist Review   Reviewed by: De Blanch, PharmD Clinical Pharmacist [facility]  [phone]    

## 2020-06-07 ENCOUNTER — Ambulatory Visit: Payer: Medicare HMO | Admitting: Pharmacist

## 2020-06-07 ENCOUNTER — Other Ambulatory Visit: Payer: Self-pay

## 2020-06-07 DIAGNOSIS — E1165 Type 2 diabetes mellitus with hyperglycemia: Secondary | ICD-10-CM

## 2020-06-07 DIAGNOSIS — E1169 Type 2 diabetes mellitus with other specified complication: Secondary | ICD-10-CM

## 2020-06-07 DIAGNOSIS — I10 Essential (primary) hypertension: Secondary | ICD-10-CM

## 2020-06-07 NOTE — Chronic Care Management (AMB) (Signed)
Chronic Care Management Pharmacy  Name: [name]  MRN: [id number] DOB: [birth year]  Chief Complaint/ HPI  [name],  74 y.o. , female presents for their Initial CCM visit with the clinical pharmacist via telephone due to COVID-19 Pandemic.  PCP : Ann Held, DO  Their chronic conditions include: Hypertension, Hyperlipidemia/Hx of CAD, Diabetes, Tobacco Use Disorder, Depression/Anxiety, Osteoporosis, Neuropathy  Office Visits: 05/24/20: Visit w/ Dr. Etter Sjogren - Cerumen impaction. Pt used debrox. L ear now clear. Irrigated R ear Tm clear and hearing normal.  05/17/20: Visit w/ Dr. Etter Sjogren - Cerumen impaction. Unable to remove w/ cerumen loop. Use debrox and rto in few days. Tessalon pearls for cough. RTC 1 week.   05/12/20: Medicare Annual Wellness Exam w/ [name], RN -  Goal updated to start silver sneakers and stop smoking.  03/10/20: Visit w/ Dr. Etter Sjogren - Atorvastatin changed to rosuvastatin 21m daily  Consult Visit: 02/10/20: Optometry visit w/ [name]  02/08/20: Endo visit w/ Dr. BChalmers Cater [place] G[place]- Increase lisinopril to 457m Would like patient to be on SGLT2 or GLP1RA  12/02/19: Cardio visit w/ Dr. ChHarrell Gave Increase lisinopril to 2055maily  Medications: Outpatient Encounter Medications as of 06/07/2020  Medication Sig  . alendronate (FOSAMAX) 70 MG tablet TAKE 1 TABLET BY MOUTH EVERY WEEK WITH FULL GLASS OF WATER ON AN EMPTY STOMACH  . ALPRAZolam (XANAX) 0.25 MG tablet Take 1 tablet by mouth three times a Julita Ozbun if needed  . amLODipine (NORVASC) 10 MG tablet Take 1 tablet (10 mg total) by mouth daily.  . aMarland Kitchenpirin EC 81 MG tablet Take 81 mg by mouth daily.  . benzonatate (TESSALON) 100 MG capsule Take 1 capsule (100 mg total) by mouth 3 (three) times daily as needed for cough.  . fenofibrate 160 MG tablet Take 1 tablet (160 mg total) by mouth daily.  . FMarland KitchenUoxetine (PROZAC) 20 MG capsule TAKE 1 CAPSULE EVERY Jewelia Bocchino  .  fluticasone (FLONASE) 50 MCG/ACT nasal spray Place 2 sprays into both nostrils daily as needed for allergies or rhinitis.  . iMarland Kitchensulin regular (NOVOLIN R RELION) 100 units/mL injection Inject 0.3 mLs (30 Units total) into the skin daily as needed for high blood sugar (at noon if CBG >200 and will be eating). (Patient taking differently: Inject 15 Units into the skin 2 (two) times daily before a meal. )  . lisinopril (ZESTRIL) 40 MG tablet Take 1 tablet (40 mg total) by mouth daily.  . metoprolol succinate (TOPROL-XL) 50 MG 24 hr tablet TAKE 1 TABLET(50 MG) BY MOUTH DAILY WITH FOOD  . neomycin-polymyxin-hydrocortisone (CORTISPORIN) 3.5-10000-1 OTIC suspension Place 4 drops into the left ear 3 (three) times daily as needed.  . NMarland KitchenVOLIN N RELION 100 UNIT/ML injection Inject 30-60 Units into the skin 2 (two) times daily. 50 units before breakfast and 50 units before bedtime  . rosuvastatin (CRESTOR) 20 MG tablet Take 1 tablet (20 mg total) by mouth daily.  . vitamin E 400 UNIT capsule Take 400 Units by mouth daily.  . Alcohol Swabs (B-D SINGLE USE SWABS REGULAR) PADS USE AS DIRECTED  . Blood Glucose Calibration (TAI DOC CONTROL) NORMAL SOLN   . Blood Glucose Monitoring Suppl (TRUE METRIX AIR GLUCOSE METER) w/Device KIT 1 each by Does not apply route 4 (four) times daily. DX CODE E11.8  . Carboxymethylcellulose Sodium (THERATEARS OP) Place 1 drop into both eyes daily as needed (dry eyes).  . FERRETTS 325 (106 Fe) MG TABS  tablet Take 1 tablet by mouth daily. (Patient not taking: Reported on 06/07/2020)  . ferrous sulfate 325 (65 FE) MG tablet Take 325 mg by mouth daily with breakfast. (Patient not taking: Reported on 06/07/2020)  . Flaxseed, Linseed, (FLAXSEED OIL PO) Take 1 capsule by mouth 2 (two) times daily.   [name] gabapentin (NEURONTIN) 300 MG capsule Take 1 capsule (300 mg total) by mouth 2 (two) times daily. If tolerating may take 3 times per day if needed. (Patient not taking: Reported on 06/07/2020)  .  glucose blood (TRUE METRIX BLOOD GLUCOSE TEST) test strip USE AS DIRECTED 4 TIMES A DAY  . Insulin Syringe-Needle U-100 (BD INSULIN SYRINGE U/F) 31G X 5/16" 0.3 ML MISC 10 Units by Does not apply route 3 (three) times daily before meals.  Elmore Guise Devices (LANCING DEVICE) MISC   . Multiple Vitamin (MULTIVITAMIN WITH MINERALS) TABS Take 1 tablet by mouth daily.  . Omega-3 Fatty Acids (FISH OIL) 1000 MG CAPS Take by mouth.  . TRUEplus Lancets 33G MISC USE AS DIRECTED 4 TIMES A DAY   No facility-administered encounter medications on file as of 06/07/2020.   SDOH Screenings   Alcohol Screen:   . Last Alcohol Screening Score (AUDIT): Not on file  Depression (PHQ2-9): Medium Risk  . PHQ-2 Score: 7  Financial Resource Strain: Medium Risk  . Difficulty of Paying Living Expenses: Somewhat hard  Food Insecurity: No Food Insecurity  . Worried About Charity fundraiser in the Last Year: Never true  . Ran Out of Food in the Last Year: Never true  Housing: Low Risk   . Last Housing Risk Score: 0  Physical Activity:   . Days of Exercise per Week: Not on file  . Minutes of Exercise per Session: Not on file  Social Connections:   . Frequency of Communication with Friends and Family: Not on file  . Frequency of Social Gatherings with Friends and Family: Not on file  . Attends Religious Services: Not on file  . Active Member of Clubs or Organizations: Not on file  . Attends Archivist Meetings: Not on file  . Marital Status: Not on file  Stress:   . Feeling of Stress : Not on file  Tobacco Use: Medium Risk  . Smoking Tobacco Use: Former Smoker  . Smokeless Tobacco Use: Never Used  Transportation Needs: No Transportation Needs  . Lack of Transportation (Medical): No  . Lack of Transportation (Non-Medical): No    Current Diagnosis/Assessment:  Goals Addressed            This Visit's Progress   . Chronic Care Management Pharmacy Care Plan       CARE PLAN ENTRY (see longitudinal  plan of care for additional care plan information)  Current Barriers:  . Chronic Disease Management support, education, and care coordination needs related to Hypertension, Hyperlipidemia/Hx of CAD, Diabetes, Tobacco Use Disorder, Depression/Anxiety, Osteoporosis, Neuropathy   Hypertension BP Readings from Last 3 Encounters:  05/24/20 (!) 129/50  05/23/20 130/70  05/17/20 120/60   . Pharmacist Clinical Goal(s): o Over the next 90 days, patient will work with PharmD and providers to maintain BP goal <130/80 . Current regimen:  . Amlodipine 25m daily . Lisinopril 493mdaily . Metoprolol Succinate 503maily . Interventions: o Requested patient to check her blood pressure 2-3 times per week and record . Patient self care activities - Over the next 90 days, patient will: o Check BP 2-3 times per week, document, and provide at  future appointments o Ensure daily salt intake < 2300 mg/Cola Gane  Hyperlipidemia Lab Results  Component Value Date/Time   LDLCALC 78 07/28/2019 02:50 PM   LDLDIRECT 100.0 03/10/2020 02:10 PM   . Pharmacist Clinical Goal(s): o Over the next 90 days, patient will work with PharmD and providers to maintain LDL goal < 70 . Current regimen:  . Aspirin 14m daily . Fenofibrate 1684mdaily . Flaxseed Oil 1 capsule twice daily  . Omega 3 fish Oil 10306mwice daily . Rosuvastatin 33m61mily . Interventions: o Discussed limited efficacy of taking fish oil and flaxseed oil. Will continue this discussion o Discussed that patient should be taking rosuvastatin vs atorvastatin (This was changed June 2021) . Patient self care activities - Over the next 90 days, patient will: o A low fat, low cholesterol is discussed with the patient, and a written copy is given to her.   Diabetes Lab Results  Component Value Date/Time   HGBA1C 9.4 (H) 03/10/2020 02:10 PM   HGBA1C 10.1 (H) 07/28/2019 02:50 PM   . Pharmacist Clinical Goal(s): o Over the next 180 days, patient will work  with PharmD and providers to achieve A1c goal <7% . Current regimen:  . Relion Novolin R 30 units daily as needed for high blood sugar (at noon if BG >200 and will be eating ) . Relion Novolin N 30-60 units twice daily Interventions: o Discussed DM goals - Fasting (before meal blood sugar): 80-130 - Post-Prandial (2 hrs after eating blood sugar): less than 180 - A1c (3 month blood sugar average): less than 7% o Requested patient to check BG twice daily o Consider consistently using Relion Novolin N 50 units twice daily  o Consider consistently using Relion Novolin R 15 units twice daily before meals o Collaboration with provider regarding medication management (transition to basal/bolus regimen) o Complete patient assistance program application for basal/bolus regimen . Patient self care activities - Over the next 90 days, patient will: o Check blood sugar twice daily, document, and provide at future appointments o Contact provider with any episodes of hypoglycemia o Consistently take Novolin N 50 units twice daily o Consistently take Novolon R 15 units before meals (twice daily)  Osteoporosis . Pharmacist Clinical Goal(s) o Over the next 180 days, patient will work with PharmD and providers to reduce risk of fracture due to osteoporosis  . Current regimen:  o Alendronate 70mg27mkly . Interventions: o Consider repeat DEXA Scan . Patient self care activities - Over the next 180 days, patient will: o Consider repeat DEXA Scan  Medication management . Pharmacist Clinical Goal(s): o Over the next 90 days, patient will work with PharmD and providers to maintain optimal medication adherence . Current pharmacy: [facility] switched to [facility] . Interventions o Comprehensive medication review performed. o Utilize [facility] pharmacy for medication synchronization, packaging and delivery . Patient self care activities - Over the next 90 days, patient will: o Focus on medication adherence  by filling and taking medications appropriately  o Take medications as prescribed o Report any questions or concerns to PharmD and/or provider(s)  Initial goal documentation      Social Hx:  Married to HenryClear Channel Communicationso a CCM patient).  Has 5 children and over 40 grand, step-grand, and great grandchildren.  Her children are very important to her and motivate her to get things done. She loves cooking for them.  Her newest great grandchild was born on 8/8/[birth year]/08/33mother passed away in Febru2020/03/08patient was made  executor of her mother's will. This has caused a lot of turmoil among family members.    Hypertension   BP goal is:  <130/80 (per Cardio)  Office blood pressures are  BP Readings from Last 3 Encounters:  05/24/20 (!) 129/50  05/23/20 130/70  05/17/20 120/60   Patient checks BP at home infrequently (last time was 2-3 weeks ago) Patient home BP readings are ranging: Unable to assess  Patient has failed these meds in the past: None noted  Patient is currently controlled on the following medications:  . Amlodipine 37m daily . Lisinopril 427mdaily . Metoprolol Succinate 5082maily  Patient Reported Adherence States she is usually good about taking these. Will miss about once per week  We discussed BP goal  Plan -Check blood pressure 2-3 times per week and record -Continue current medications     Diabetes   Follows with Dr. BalChalmers Caterndo) about every 4 months Next appt on 07/29/20  A1c goal <7%  FBG goal: 80-130 PPBG goal: <180  Recent Relevant Labs: Lab Results  Component Value Date/Time   HGBA1C 9.4 (H) 03/10/2020 02:10 PM   HGBA1C 10.1 (H) 07/28/2019 02:50 PM   GFR 53.11 (L) 03/10/2020 02:10 PM   GFR 72.29 07/28/2019 02:50 PM   MICROALBUR 27.3 (H) 07/28/2019 02:50 PM   MICROALBUR 1.7 06/23/2015 10:21 AM    Last diabetic Eye exam: (noted April 2021) Lab Results  Component Value Date/Time   HMDIABEYEEXA Cataracts 07/08/2013 12:00 AM      Last diabetic Foot exam: No results found for: HMDIABFOOTEX (noted on 03/10/20 visit)  Checking BG: 5-6 times per week  Recent FBG Readings: Unable to assess Recent 2hr PP BG readings:  Unable to assess Recent HS BG readings: 325 last night (reports she had a busy Nadra Hritz and missed all of her medicine yesterday)  Patient has failed these meds in past: None noted  Patient is currently uncontrolled on the following medications: . Relion Novolin R 30 units daily as needed for high blood sugar (at noon if BG >200 and will be eating ) . Relion Novolin N 50 units before breakfast and 50 units before bedtime (prescribed as 30-60 units BID)  What happened to Lantus? Doesn't recall taking Humalog? Would get in the donut whole in August, so she would stop taking it.  Wakes up in the morning with low blood sugar taking Novolin N as prescribed, so she takes 50 units at each dose.  Does not use R daily. Shes 15-20 units of R. Might only give 15 units at night. She  "I am really afraid of the R" Reports one time her BG was down to 29 when taking 25 units. States she is most comfortable with a dose of Novolin R 15 units per dose.  [facility] Adherence Novolin N - 4/7 days takes both doses Novolin R - 5/7 days takes at least one dose  Reports she takes R at bedtime. Discussed that R should be taken with meals and N should be taken at bedtime. This could be a contributing factor to her nocturnal hypoglycemia. Last hypoglycemic episode was about 3 weeks ago. Woke up in the middle of the night.   We discussed: DM goals (a1c <7%, FBG 80-130, PPBG <180) , We discussed that Jardiance would have to be prescribed by Dr. BalChalmers Caterut that we would work on transitioning her insulin regimen before adding another oral medication.  Diet  Eats twice daily B - eggs, sausage, sometimes grits, ham L - skips, but if she does eat mid day it is a usually a burger  ([employer], [employer], [place]) D - Meat and a green vegetable and a starch most days Snacks - Bag of chips about twice a week Drinks - Lately drinking a lot of water, or diet pepsi or coke, occassionally juice and regular soda  Exercise Nothing outside of running errands. Planned to start a walking club, but her knees give her pain  Plan -Check blood sugar fasting each morning and 2 hour post prandial -Consistently take Novolin N 50 units twice daily before breakfast and before bedtime -Consistently take Novolin R 15 units before breakfast and before dinner -Titrate insulin as needed and create plan to transition to a basal/bolus regimen for more predictable pharmacodynamics of a long-acting and short-acting insulin vs Novolin N and Novolin R.   Future -Complete a patient assistance program for basal/bolus regimen Tyler Aas and Novolog) -Will coordinate with Dr. Almetta Lovely office to get patient transitioned appropriately  Hyperlipidemia/Hx of CAD   LDL goal <70  Lipid Panel     Component Value Date/Time   CHOL 171 03/10/2020 1410   TRIG 204.0 (H) 03/10/2020 1410   HDL 37.00 (L) 03/10/2020 1410   LDLCALC 78 07/28/2019 1450   LDLDIRECT 100.0 03/10/2020 1410    Hepatic Function Latest Ref Rng & Units 03/10/2020 07/28/2019 10/21/2018  Total Protein 6.0 - 8.3 g/dL 6.7 6.5 6.7  Albumin 3.5 - 5.2 g/dL 4.4 4.3 -  AST 0 - 37 U/L 18 18 -  ALT 0 - 35 U/L 18 20 -  Alk Phosphatase 39 - 117 U/L 39 62 -  Total Bilirubin 0.2 - 1.2 mg/dL 0.7 0.8 -  Bilirubin, Direct 0.0 - 0.3 mg/dL - - -     The 10-year ASCVD risk score Mikey Bussing DC Jr., et al., 2013) is: 25.1%   Values used to calculate the score:     Age: 53 years     Sex: Female     Is Non-Hispanic African American: Yes     Diabetic: Yes     Tobacco smoker: No     Systolic Blood Pressure: 465 mmHg     Is BP treated: Yes     HDL Cholesterol: 37 mg/dL     Total Cholesterol: 171 mg/dL   Patient has failed these meds in past:  atorvastatin (inefficacy?), lovaza (listed in D/C meds. No apparent reason for D/C) Patient is currently uncontrolled on the following medications:  . Aspirin 77m daily . Fenofibrate 1623mdaily . Flaxseed Oil 1 capsule twice daily  . Omega 3 fish Oil 103074mwice daily . Rosuvastatin 12m28mily  Patient states she got confused with atorvastatin and rosuvastatin.  She denies taking both atorvastatin and rosuvastatin at same time, but would alternate between the two. She states she has been taking the rosuvastatin and fenofibrate. Encouraged patient to discard atorvastatin and switch to rosuvastatin.   We discussed:  How flaxseed oil and omega 3 oil may not be helpful for patient. Noting her elevated lipid panel will reassess this upon next lipid panel with change to rosuvastatin  Plan -Take rosuvastatin to replace atorvastatin daily   Tobacco Use Disorder   Tobacco Status:  Social History   Tobacco Use  Smoking Status Former Smoker  . Packs/day: 0.20  . Types: Cigarettes  . Quit date: 05/05/2020  . Years since quitting: 0.0  Smokeless Tobacco Never Used    Patient  smokes After 30 minutes of waking Patient triggers include: stress Is considered a former smoker, but will smoke a cigarette occasionally when stressed.  Previous quit attempts included: 2 (on most recent time this time. Previous time was 10 years ago and she stopped for 2-3 years, playing bingo reintroduced cigarettes in her life) Patient is currently controlled on the following medications:  . None  She stopped cold [place] about 4 weeks ago. States she did smoke 1 cigarette last week.  She doesn't buy them anymore, but she got one from her son. 2 of her sons smoke. Will use food, watching a movie, or self control to replace urge to smoke. Has never used nicotine gum before.   We discussed:  Counseled on park & chew method for NRT gum. Discussed how if food, a movie, or a practicing self-control does not work  she can try nicotine gum  Plan -Continue refraining from smoking   Depression/Anxiety   Depression screen [facility] 2/9 06/07/2020 05/12/2020 05/11/2019  Decreased Interest 2 0 0  Down, Depressed, Hopeless 1 0 0  PHQ - 2 Score 3 0 0  Altered sleeping 1 - -  Tired, decreased energy 1 - -  Change in appetite 1 - -  Feeling bad or failure about yourself  0 - -  Trouble concentrating 1 - -  Moving slowly or fidgety/restless 0 - -  Suicidal thoughts 0 - -  PHQ-9 Score 7 - -  Difficult doing work/chores Very difficult - -  Some recent data might be hidden    Patient has failed these meds in past: sertraline (palpitations) Patient is currently uncontrolled on the following medications:  . Alprazolam 0.58m three times daily as needed (takes about 2-3 times per week or less) . Fluoxetine 232mdaily   Last week was a bad week for her and she took xanax twice. She is dealing with family disagreements as she is the executor of her mother's will.  2020 was a rough year for her. She lost her mother, nephew, brother in law, 3 friends, and 2 grandsons. Would like to give this more time before making change  We discussed:  Option of medication titration, but she would like to wait for now. Encouraged patient to reach out if further help is needed. She verbalizes understanding.  Plan -Continue current medications   Osteoporosis   Last DEXA Scan: 05/29/2017  T-Score femoral neck: -2.0  T-Score forearm radius: -2.5  No results found for: VD25OH   Patient has failed these meds in past: None noted  Patient is currently controlled on the following medications:  . Alendronate 7057meekly (Sunday)   Plan -Complete DEXA Scan -Continue current medications  Neuropathy    Patient has failed these meds in past: None noted  Patient is currently stable on the following medications: . Gabapentin 300m27mice to three times daily (not taking)   States she was taking this, but it didn't help.   Last dose was about 6 months ago  Plan -Continue current management  Future Plan -Discuss further at future visits. Will focus on DM control and see if this helps relieve symptoms  Medication Management   [facility] Uses pill box? Yes Pt endorses 80% compliance  She feels mail order pharmacy sends her more medication than she needs, so she would like to stop using mail order.  Discussed how [facility] could help coordinate her medications noting her husband is also receiving his  medications from [facility] and she would like to transition her medications to [facility]  Plan -Utilize [facility] pharmacy for medication synchronization, packaging and delivery   Verbal consent obtained for [facility] enhanced pharmacy services (medication synchronization, adherence packaging, delivery coordination). A medication sync plan was created to allow patient to get all medications delivered once every 30 to 90 days per patient preference. Patient understands they have freedom to choose pharmacy and clinical pharmacist will coordinate care between all prescribers and [facility].   Follow up:  2 week BG check in with [place] 1 month phone visit with Melvenia Beam   Miscellaneous Meds Benzonatate 150m PRN about twice a daily Theratears Fluticasone 557m Multivitamin Cortisporin Vitamin E 400 units daily (Was told this would help with her breast pain and it has been helpful for her. Discussed risk/benefit and feel this specific patient has greater benefit than risk noting it is on the lower end (<1000 units/day))  Meds to D/C from list Ferrous Sulfate 32555m

## 2020-06-07 NOTE — Patient Instructions (Addendum)
Visit Information  Goals Addressed            This Visit's Progress   . Chronic Care Management Pharmacy Care Plan       CARE PLAN ENTRY (see longitudinal plan of care for additional care plan information)  Current Barriers:  . Chronic Disease Management support, education, and care coordination needs related to Hypertension, Hyperlipidemia/Hx of CAD, Diabetes, Tobacco Use Disorder, Depression/Anxiety, Osteoporosis, Neuropathy   Hypertension BP Readings from Last 3 Encounters:  05/24/20 (!) 129/50  05/23/20 130/70  05/17/20 120/60   . Pharmacist Clinical Goal(s): o Over the next 90 days, patient will work with PharmD and providers to maintain BP goal <130/80 . Current regimen:  . Amlodipine 10mg  daily . Lisinopril 40mg  daily . Metoprolol Succinate 50mg  daily . Interventions: o Requested patient to check her blood pressure 2-3 times per week and record . Patient self care activities - Over the next 90 days, patient will: o Check BP 2-3 times per week, document, and provide at future appointments o Ensure daily salt intake < 2300 mg/[name]  Hyperlipidemia Lab Results  Component Value Date/Time   LDLCALC 78 07/28/2019 02:50 PM   LDLDIRECT 100.0 03/10/2020 02:10 PM   . Pharmacist Clinical Goal(s): o Over the next 90 days, patient will work with PharmD and providers to maintain LDL goal < 70 . Current regimen:  . Aspirin 81mg  daily . Fenofibrate 160mg  daily . Flaxseed Oil 1 capsule twice daily  . Omega 3 fish Oil 1030mg  twice daily . Rosuvastatin 20mg  daily . Interventions: o Discussed limited efficacy of taking fish oil and flaxseed oil. Will continue this discussion o Discussed that patient should be taking rosuvastatin vs atorvastatin (This was changed June 2021) . Patient self care activities - Over the next 90 days, patient will: o A low fat, low cholesterol is discussed with the patient, and a written copy is given to her.   Diabetes Lab Results  Component Value  Date/Time   HGBA1C 9.4 (H) 03/10/2020 02:10 PM   HGBA1C 10.1 (H) 07/28/2019 02:50 PM   . Pharmacist Clinical Goal(s): o Over the next 180 days, patient will work with PharmD and providers to achieve A1c goal <7% . Current regimen:  . Relion Novolin R 30 units daily as needed for high blood sugar (at noon if BG >200 and will be eating ) . Relion Novolin N 30-60 units twice daily Interventions: o Discussed DM goals - Fasting (before meal blood sugar): 80-130 - Post-Prandial (2 hrs after eating blood sugar): less than 180 - A1c (3 month blood sugar average): less than 7% o Requested patient to check BG twice daily o Consider consistently using Relion Novolin N 50 units twice daily  o Consider consistently using Relion Novolin R 15 units twice daily before meals o Collaboration with provider regarding medication management (transition to basal/bolus regimen) o Complete patient assistance program application for basal/bolus regimen . Patient self care activities - Over the next 90 days, patient will: o Check blood sugar twice daily, document, and provide at future appointments o Contact provider with any episodes of hypoglycemia o Consistently take Novolin N 50 units twice daily o Consistently take Novolon R 15 units before meals (twice daily)  Osteoporosis . Pharmacist Clinical Goal(s) o Over the next 180 days, patient will work with PharmD and providers to reduce risk of fracture due to osteoporosis  . Current regimen:  o Alendronate 70mg  weekly . Interventions: o Consider repeat DEXA Scan . Patient self care activities -  Over the next 180 days, patient will: o Consider repeat DEXA Scan  Medication management . Pharmacist Clinical Goal(s): o Over the next 90 days, patient will work with PharmD and providers to maintain optimal medication adherence . Current pharmacy: [facility] switched to [facility] . Interventions o Comprehensive medication review performed. o Utilize [facility]  pharmacy for medication synchronization, packaging and delivery . Patient self care activities - Over the next 90 days, patient will: o Focus on medication adherence by filling and taking medications appropriately  o Take medications as prescribed o Report any questions or concerns to PharmD and/or provider(s)  Initial goal documentation        [name] was given information about Chronic Care Management services today including:  1. CCM service includes personalized support from designated clinical staff supervised by her physician, including individualized plan of care and coordination with other care providers 2. 24/7 contact phone numbers for assistance for urgent and routine care needs. 3. Standard insurance, coinsurance, copays and deductibles apply for chronic care management only during months in which we provide at least 20 minutes of these services. Most insurances cover these services at 100%, however patients may be responsible for any copay, coinsurance and/or deductible if applicable. This service may help you avoid the need for more expensive face-to-face services. 4. Only one practitioner may furnish and bill the service in a calendar month. 5. The patient may stop CCM services at any time (effective at the end of the month) by phone call to the office staff.  Patient agreed to services and verbal consent obtained.   The patient verbalized understanding of instructions provided today and agreed to receive a mailed copy of patient instruction and/or educational materials. Telephone follow up appointment with pharmacy team member scheduled for: 07/07/2020  [name], PharmD Clinical Pharmacist [facility] [phone]   Carbohydrate Counting for Diabetes Mellitus, Adult  Carbohydrate counting is a method of keeping track of how many carbohydrates you eat. Eating carbohydrates naturally increases the amount of sugar (glucose) in the  blood. Counting how many carbohydrates you eat helps keep your blood glucose within normal limits, which helps you manage your diabetes (diabetes mellitus). It is important to know how many carbohydrates you can safely have in each meal. This is different for every person. A diet and nutrition specialist (registered dietitian) can help you make a meal plan and calculate how many carbohydrates you should have at each meal and snack. Carbohydrates are found in the following foods:  Grains, such as breads and cereals.  Dried beans and soy products.  Starchy vegetables, such as potatoes, peas, and corn.  Fruit and fruit juices.  Milk and yogurt.  Sweets and snack foods, such as cake, cookies, candy, chips, and soft drinks. How do I count carbohydrates? There are two ways to count carbohydrates in food. You can use either of the methods or a combination of both. Reading "Nutrition Facts" on packaged food The "Nutrition Facts" list is included on the labels of almost all packaged foods and beverages in the [place] It includes:  The serving size.  Information about nutrients in each serving, including the grams (g) of carbohydrate per serving. To use the "Nutrition Facts":  Decide how many servings you will have.  Multiply the number of servings by the number of carbohydrates per serving.  The resulting number is the total amount of carbohydrates that you will be having. Learning standard serving sizes of other foods When you eat carbohydrate  foods that are not packaged or do not include "Nutrition Facts" on the label, you need to measure the servings in order to count the amount of carbohydrates:  Measure the foods that you will eat with a food scale or measuring cup, if needed.  Decide how many standard-size servings you will eat.  Multiply the number of servings by 15. Most carbohydrate-rich foods have about 15 g of carbohydrates per serving. ? For example, if you eat 8 oz (170 g) of  strawberries, you will have eaten 2 servings and 30 g of carbohydrates (2 servings x 15 g = 30 g).  For foods that have more than one food mixed, such as soups and casseroles, you must count the carbohydrates in each food that is included. The following list contains standard serving sizes of common carbohydrate-rich foods. Each of these servings has about 15 g of carbohydrates:   hamburger bun or  English muffin.   oz (15 mL) syrup.   oz (14 g) jelly.  1 slice of bread.  1 six-inch tortilla.  3 oz (85 g) cooked rice or pasta.  4 oz (113 g) cooked dried beans.  4 oz (113 g) starchy vegetable, such as peas, corn, or potatoes.  4 oz (113 g) hot cereal.  4 oz (113 g) mashed potatoes or  of a large baked potato.  4 oz (113 g) canned or frozen fruit.  4 oz (120 mL) fruit juice.  4-6 crackers.  6 chicken nuggets.  6 oz (170 g) unsweetened dry cereal.  6 oz (170 g) plain fat-free yogurt or yogurt sweetened with artificial sweeteners.  8 oz (240 mL) milk.  8 oz (170 g) fresh fruit or one small piece of fruit.  24 oz (680 g) popped popcorn. Example of carbohydrate counting Sample meal  3 oz (85 g) chicken breast.  6 oz (170 g) brown rice.  4 oz (113 g) corn.  8 oz (240 mL) milk.  8 oz (170 g) strawberries with sugar-free whipped topping. Carbohydrate calculation 1. Identify the foods that contain carbohydrates: ? Rice. ? Corn. ? Milk. ? Strawberries. 2. Calculate how many servings you have of each food: ? 2 servings rice. ? 1 serving corn. ? 1 serving milk. ? 1 serving strawberries. 3. Multiply each number of servings by 15 g: ? 2 servings rice x 15 g = 30 g. ? 1 serving corn x 15 g = 15 g. ? 1 serving milk x 15 g = 15 g. ? 1 serving strawberries x 15 g = 15 g. 4. Add together all of the amounts to find the total grams of carbohydrates eaten: ? 30 g + 15 g + 15 g + 15 g = 75 g of carbohydrates total. Summary  Carbohydrate counting is a method of  keeping track of how many carbohydrates you eat.  Eating carbohydrates naturally increases the amount of sugar (glucose) in the blood.  Counting how many carbohydrates you eat helps keep your blood glucose within normal limits, which helps you manage your diabetes.  A diet and nutrition specialist (registered dietitian) can help you make a meal plan and calculate how many carbohydrates you should have at each meal and snack. This information is not intended to replace advice given to you by your health care provider. Make sure you discuss any questions you have with your health care provider. Document Revised: 04/18/2017 Document Reviewed: 03/07/2016 Elsevier Patient Education  2020 [employer].

## 2020-06-09 ENCOUNTER — Other Ambulatory Visit: Payer: Self-pay

## 2020-06-09 DIAGNOSIS — I10 Essential (primary) hypertension: Secondary | ICD-10-CM

## 2020-06-09 DIAGNOSIS — E785 Hyperlipidemia, unspecified: Secondary | ICD-10-CM

## 2020-06-09 MED ORDER — ALENDRONATE SODIUM 70 MG PO TABS: ORAL_TABLET | ORAL | 2 refills | Status: DC

## 2020-06-09 MED ORDER — ROSUVASTATIN CALCIUM 20 MG PO TABS
20.0000 mg | ORAL_TABLET | Freq: Every day | ORAL | 1 refills | Status: DC
Start: 2020-06-09 — End: 2020-07-23

## 2020-06-09 MED ORDER — FLUOXETINE HCL 20 MG PO CAPS: ORAL_CAPSULE | ORAL | 1 refills | Status: DC

## 2020-06-09 MED ORDER — AMLODIPINE BESYLATE 10 MG PO TABS: 10.0000 mg | ORAL_TABLET | Freq: Every day | ORAL | 1 refills | Status: DC

## 2020-06-09 MED ORDER — FENOFIBRATE 160 MG PO TABS: 160.0000 mg | ORAL_TABLET | Freq: Every day | ORAL | 1 refills | Status: DC

## 2020-06-12 ENCOUNTER — Other Ambulatory Visit: Payer: Self-pay | Admitting: Family Medicine

## 2020-06-15 ENCOUNTER — Other Ambulatory Visit: Payer: Self-pay

## 2020-06-15 ENCOUNTER — Other Ambulatory Visit (INDEPENDENT_AMBULATORY_CARE_PROVIDER_SITE_OTHER): Payer: Medicare HMO

## 2020-06-15 DIAGNOSIS — H40013 Open angle with borderline findings, low risk, bilateral: Secondary | ICD-10-CM | POA: Diagnosis not present

## 2020-06-15 DIAGNOSIS — E1169 Type 2 diabetes mellitus with other specified complication: Secondary | ICD-10-CM

## 2020-06-15 DIAGNOSIS — E785 Hyperlipidemia, unspecified: Secondary | ICD-10-CM

## 2020-06-15 DIAGNOSIS — E1165 Type 2 diabetes mellitus with hyperglycemia: Secondary | ICD-10-CM

## 2020-06-16 LAB — COMPREHENSIVE METABOLIC PANEL
AG Ratio: 1.9 (calc) (ref 1.0–2.5)
ALT: 17 U/L (ref 6–29)
AST: 18 U/L (ref 10–35)
Albumin: 4.5 g/dL (ref 3.6–5.1)
Alkaline phosphatase (APISO): 47 U/L (ref 37–153)
BUN/Creatinine Ratio: 16 (calc) (ref 6–22)
BUN: 17 mg/dL (ref 7–25)
CO2: 27 mmol/L (ref 20–32)
Calcium: 10.2 mg/dL (ref 8.6–10.4)
Chloride: 100 mmol/L (ref 98–110)
Creat: 1.04 mg/dL — ABNORMAL HIGH (ref 0.60–0.93)
Globulin: 2.4 g/dL (calc) (ref 1.9–3.7)
Glucose, Bld: 331 mg/dL — ABNORMAL HIGH (ref 65–99)
Potassium: 3.7 mmol/L (ref 3.5–5.3)
Sodium: 135 mmol/L (ref 135–146)
Total Bilirubin: 0.4 mg/dL (ref 0.2–1.2)
Total Protein: 6.9 g/dL (ref 6.1–8.1)

## 2020-06-16 LAB — LIPID PANEL
Cholesterol: 131 mg/dL (ref <200)
HDL: 29 mg/dL — ABNORMAL LOW (ref >=50)
LDL Cholesterol (Calc): 61 mg/dL (calc)
Non-HDL Cholesterol (Calc): 102 mg/dL (calc) (ref <130)
Total CHOL/HDL Ratio: 4.5 (calc) (ref <5.0)
Triglycerides: 344 mg/dL — ABNORMAL HIGH (ref <150)

## 2020-06-16 LAB — HEMOGLOBIN A1C
Hgb A1c MFr Bld: 10 % of total Hgb — ABNORMAL HIGH (ref <5.7)
Mean Plasma Glucose: 240 (calc)
eAG (mmol/L): 13.3 (calc)

## 2020-06-21 ENCOUNTER — Telehealth: Payer: Self-pay | Admitting: Pharmacist

## 2020-06-21 NOTE — Progress Notes (Addendum)
Chronic Care Management Pharmacy Assistant   Name: [name]  MRN: [id number] DOB: [birth year]  Reason for Encounter: Disease State  Patient Questions:  1.  Have you seen any other providers since your last visit? No  2.  Any changes in your medicines or health? No  PCP : Ann Held, DO   Their chronic conditions include: Hypertension, Hyperlipidemia/Hx of CAD, Diabetes, Tobacco Use Disorder, Depression/Anxiety, Osteoporosis, Neuropathy.  There have been no OV's, Consults, or Hospitalizations since their last CCM visit with the clinical pharmacist on 06-07-20.  Allergies:   Allergies  Allergen Reactions  . Levemir [Insulin Detemir] Hives and Swelling  . Metformin And Related Other (See Comments)    Sick on stomach, sweaty, throw up, pain    Medications: Outpatient Encounter Medications as of 06/21/2020  Medication Sig  . Alcohol Swabs (B-D SINGLE USE SWABS REGULAR) PADS USE AS DIRECTED  . alendronate (FOSAMAX) 70 MG tablet Take with a full glass of water on an empty stomach.  . ALPRAZolam (XANAX) 0.25 MG tablet Take 1 tablet by mouth three times a day if needed  . amLODipine (NORVASC) 10 MG tablet Take 1 tablet (10 mg total) by mouth daily.  [name] aspirin EC 81 MG tablet Take 81 mg by mouth daily.  . benzonatate (TESSALON) 100 MG capsule Take 1 capsule (100 mg total) by mouth 3 (three) times daily as needed for cough.  . Blood Glucose Calibration (TAI DOC CONTROL) NORMAL SOLN   . Blood Glucose Monitoring Suppl (TRUE METRIX AIR GLUCOSE METER) w/Device KIT 1 each by Does not apply route 4 (four) times daily. DX CODE E11.8  . Carboxymethylcellulose Sodium (THERATEARS OP) Place 1 drop into both eyes daily as needed (dry eyes).  . fenofibrate 160 MG tablet Take 1 tablet (160 mg total) by mouth daily.  [name] FERRETTS 325 (106 Fe) MG TABS tablet Take 1 tablet by mouth daily. (Patient not taking: Reported on 06/07/2020)  . ferrous sulfate 325 (65 FE) MG tablet Take 325 mg by  mouth daily with breakfast. (Patient not taking: Reported on 06/07/2020)  . Flaxseed, Linseed, (FLAXSEED OIL PO) Take 1 capsule by mouth 2 (two) times daily.   [name] FLUoxetine (PROZAC) 20 MG capsule TAKE 1 CAPSULE EVERY DAY  . fluticasone (FLONASE) 50 MCG/ACT nasal spray Place 2 sprays into both nostrils daily as needed for allergies or rhinitis.  [name] gabapentin (NEURONTIN) 300 MG capsule Take 1 capsule (300 mg total) by mouth 2 (two) times daily. If tolerating may take 3 times per day if needed. (Patient not taking: Reported on 06/07/2020)  . glucose blood (TRUE METRIX BLOOD GLUCOSE TEST) test strip USE AS DIRECTED 4 TIMES A DAY  . insulin regular (NOVOLIN R RELION) 100 units/mL injection Inject 0.3 mLs (30 Units total) into the skin daily as needed for high blood sugar (at noon if CBG >200 and will be eating). (Patient taking differently: Inject 15 Units into the skin 2 (two) times daily before a meal. )  . Insulin Syringe-Needle U-100 (BD INSULIN SYRINGE U/F) 31G X 5/16" 0.3 ML MISC 10 Units by Does not apply route 3 (three) times daily before meals.  Elmore Guise Devices (LANCING DEVICE) MISC   . lisinopril (ZESTRIL) 40 MG tablet Take 1 tablet (40 mg total) by mouth daily.  . metoprolol succinate (TOPROL-XL) 50 MG 24 hr tablet TAKE 1 TABLET(50 MG) BY MOUTH DAILY WITH FOOD  . Multiple Vitamin (MULTIVITAMIN WITH MINERALS) TABS Take 1 tablet by mouth daily.  [name]  neomycin-polymyxin-hydrocortisone (CORTISPORIN) 3.5-10000-1 OTIC suspension Place 4 drops into the left ear 3 (three) times daily as needed.  [name] NOVOLIN N RELION 100 UNIT/ML injection Inject 30-60 Units into the skin 2 (two) times daily. 50 units before breakfast and 50 units before bedtime  . Omega-3 Fatty Acids (FISH OIL) 1000 MG CAPS Take by mouth.  . rosuvastatin (CRESTOR) 20 MG tablet Take 1 tablet (20 mg total) by mouth daily.  . TRUEplus Lancets 33G MISC USE AS DIRECTED 4 TIMES A DAY  . vitamin E 400 UNIT capsule Take 400 Units by mouth daily.    No facility-administered encounter medications on file as of 06/21/2020.    Current Diagnosis: Patient Active Problem List   Diagnosis Date Noted  . Impacted cerumen of right ear 05/24/2020  . Bilateral impacted cerumen 05/17/2020  . Hyperlipidemia associated with type 2 diabetes mellitus ([place]) 03/10/2020  . Cough 03/10/2020  . Seasonal allergies 03/10/2020  . Elevated rheumatoid factor 07/21/2018  . Gastroenteritis 07/21/2018  . Trigger middle finger of left hand 12/19/2017  . Ganglion cyst of dorsum of right wrist 12/19/2017  . Morton's neuroma of right foot 12/19/2017  . Preventative health care 12/13/2017  . Pain in both hands 12/13/2017  . Hyperlipidemia LDL goal <100 12/13/2017  . Abscess of upper gum 12/13/2017  . Spinal stenosis of cervical region 09/25/2016  . Cervical radiculopathy 09/06/2016  . Dyspnea 02/18/2016  . Chest pain 02/18/2016  . HLD (hyperlipidemia) 02/18/2016  . CAD in native artery 02/18/2016  . RLS (restless legs syndrome) 02/18/2016  . Pancreatitis 02/18/2016  . SOB (shortness of breath) 02/18/2016  . Uncontrolled type 2 diabetes mellitus with complication ([place])   . Tobacco abuse   . Osteoarthritis of right knee 07/05/2015  . Smoking 07/02/2013  . Acute upper respiratory infections of unspecified site 07/02/2013  . Flu vaccine need 07/02/2013  . Multinodular goiter 04/16/2013  . Bunion 07/07/2012  . BACK PAIN, THORACIC REGION, RIGHT 05/24/2009  . IRON DEFICIENCY 03/25/2009  . GERD 03/25/2009  . WEIGHT LOSS 03/25/2009  . SINUSITIS- ACUTE-NOS 11/25/2008  . Hyperlipidemia LDL goal <70 10/07/2008  . ACUTE PANCREATITIS 09/07/2008  . OVARIAN CYST 09/07/2008  . KNEE PAIN, RIGHT 05/24/2008  . CARPAL TUNNEL SYNDROME, RIGHT 05/12/2008  . Acute pain of both shoulders 04/02/2008  . ROTATOR CUFF REPAIR, RIGHT, HX OF 03/29/2008  . GOITER, MULTINODULAR 12/26/2007  . Depression 07/07/2007  . HIP PAIN, RIGHT 03/11/2007  . RESTLESS LEG SYNDROME, HX OF  03/11/2007  . Diabetes mellitus type II, uncontrolled ([place]) 03/07/2007  . Essential hypertension 03/07/2007    Goals Addressed   None    Recent Relevant Labs: Lab Results  Component Value Date/Time   HGBA1C 10.0 (H) 06/15/2020 08:19 AM   HGBA1C 9.4 (H) 03/10/2020 02:10 PM   MICROALBUR 27.3 (H) 07/28/2019 02:50 PM   MICROALBUR 1.7 06/23/2015 10:21 AM    Kidney Function Lab Results  Component Value Date/Time   CREATININE 1.04 (H) 06/15/2020 08:19 AM   CREATININE 1.20 03/10/2020 02:10 PM   CREATININE 0.92 07/28/2019 02:50 PM   GFR 53.11 (L) 03/10/2020 02:10 PM   GFRNONAA >60 02/18/2016 10:01 AM   GFRAA >60 02/18/2016 10:01 AM    . Current antihyperglycemic regimen:   Relion Novolin R 15 units twice daily with meals  Relion Novolin N 50 units twice daily . What recent interventions/DTPs have been made to improve glycemic control:  o None recent. Last interventions were made by clinical pharmacist as followed: ? Discussed DM goals  Fasting (before meal blood sugar): 80-130  Post-Prandial (2 hrs after eating blood sugar): less than 180  A1c (3 month blood sugar average): less than 7% ? Requested patient to check BG twice daily ? Consider consistently using Relion Novolin N 50 units twice daily  ? Consider consistently using Relion Novolin R 15 units twice daily before meals ? Collaboration with provider regarding medication management (transition to basal/bolus regimen) ? Complete patient assistance program application for basal/bolus regimen.  . Have there been any recent hospitalizations or ED visits since last visit with CPP? No . Patient denies hypoglycemic symptoms, including None . Patient denies hyperglycemic symptoms, including none . How often are you checking your blood sugar? once daily . What are your blood sugars ranging?  o Fasting: 140, 160, 137 Patient reports checking her bs daily at least once a day, but sometimes forgets to check it after meals.  Reminded her to try and check twice a day, fasting and then again two hours after meals.  . During the week, how often does your blood glucose drop below 70? Yes,has in the past but not recently. Patient reported one time she recalls her blood sugar dropped as low as 29. . Are you checking your feet daily/regularly? Patient stated she has not been checking her feet regularly. Encouraged her to check daily for sore, cuts, and blisters.  Patient sates has been checking her blood pressure at home three times a week. Yesterday's readings were 139/62 in the morning and 140/67.  Adherence Review: Is the patient currently on a STATIN medication? Yes Is the patient currently on ACE/ARB medication? Yes Does the patient have >5 day gap between last estimated fill dates? Yes    Follow-Up:  Pharmacist Review   Fanny Skates, [place] Pharmacist Assistant [phone]  Fill Dates Per Dispense Report - Filled Inappropriately Atorvastatin 60m - 02/10/20 1DS?   Reviewed by: KDe Blanch PharmD Clinical Pharmacist [phone]

## 2020-07-05 ENCOUNTER — Telehealth: Payer: Self-pay | Admitting: Pharmacist

## 2020-07-05 NOTE — Progress Notes (Addendum)
Verified Adherence Gap Information. Patient is 100% Medication compliant with Statin (Rosuvastatin) and Ace/Arb's (Lisinopril).  Corwin Levins, CMA Clinical Pharmacist Assistant [phone]  Reviewed by: Katrinka Blazing, PharmD Clinical Pharmacist [facility] [place] [facility] [phone]

## 2020-07-07 ENCOUNTER — Ambulatory Visit: Payer: Self-pay | Admitting: Pharmacist

## 2020-07-07 ENCOUNTER — Telehealth: Payer: Medicare HMO

## 2020-07-07 NOTE — Chronic Care Management (AMB) (Signed)
  Chronic Care Management   Outreach Note  07/07/2020 Name: [name] MRN: [id number] DOB: March 25, 1946  Referred by: Donato Schultz, DO Reason for referral : No chief complaint on file.   Third unsuccessful telephone outreach was attempted today. The patient was referred to the pharmacist for assistance with care management and care coordination.   Follow Up Plan:  -Attempt to follow up with patient next month and get her next CCM visit scheduled  Katrinka Blazing, PharmD Clinical Pharmacist [place] [facility] [phone]

## 2020-07-07 NOTE — Chronic Care Management (AMB) (Deleted)
Chronic Care Management Pharmacy  Name: [name]  MRN: [id number] DOB: [birth year]  Chief Complaint/ HPI  [name],  74 y.o. , female presents for their Follow-Up CCM visit with the clinical pharmacist via telephone due to COVID-19 Pandemic.  PCP : Ann Held, DO  Their chronic conditions include: Hypertension, Hyperlipidemia/Hx of CAD, Diabetes, Tobacco Use Disorder, Depression/Anxiety, Osteoporosis, Neuropathy  Office Visits: None since last CCM visit on 06/07/2020.  Consult Visit: None since last CCM visit on 06/07/2020.  Medications: Outpatient Encounter Medications as of 07/07/2020  Medication Sig  . Alcohol Swabs (B-D SINGLE USE SWABS REGULAR) PADS USE AS DIRECTED  . alendronate (FOSAMAX) 70 MG tablet Take with a full glass of water on an empty stomach.  . ALPRAZolam (XANAX) 0.25 MG tablet Take 1 tablet by mouth three times a Jden Want if needed  . amLODipine (NORVASC) 10 MG tablet Take 1 tablet (10 mg total) by mouth daily.  [name] aspirin EC 81 MG tablet Take 81 mg by mouth daily.  . benzonatate (TESSALON) 100 MG capsule Take 1 capsule (100 mg total) by mouth 3 (three) times daily as needed for cough.  . Blood Glucose Calibration (TAI DOC CONTROL) NORMAL SOLN   . Blood Glucose Monitoring Suppl (TRUE METRIX AIR GLUCOSE METER) w/Device KIT 1 each by Does not apply route 4 (four) times daily. DX CODE E11.8  . Carboxymethylcellulose Sodium (THERATEARS OP) Place 1 drop into both eyes daily as needed (dry eyes).  . fenofibrate 160 MG tablet Take 1 tablet (160 mg total) by mouth daily.  [name] FERRETTS 325 (106 Fe) MG TABS tablet Take 1 tablet by mouth daily. (Patient not taking: Reported on 06/07/2020)  . ferrous sulfate 325 (65 FE) MG tablet Take 325 mg by mouth daily with breakfast. (Patient not taking: Reported on 06/07/2020)  . Flaxseed, Linseed, (FLAXSEED OIL PO) Take 1 capsule by mouth 2 (two) times daily.   [name] FLUoxetine (PROZAC) 20 MG capsule TAKE 1 CAPSULE EVERY  Reise Gladney  . fluticasone (FLONASE) 50 MCG/ACT nasal spray Place 2 sprays into both nostrils daily as needed for allergies or rhinitis.  [name] gabapentin (NEURONTIN) 300 MG capsule Take 1 capsule (300 mg total) by mouth 2 (two) times daily. If tolerating may take 3 times per Cecilia Vancleve if needed. (Patient not taking: Reported on 06/07/2020)  . glucose blood (TRUE METRIX BLOOD GLUCOSE TEST) test strip USE AS DIRECTED 4 TIMES A Kianah Harries  . insulin regular (NOVOLIN R RELION) 100 units/mL injection Inject 0.3 mLs (30 Units total) into the skin daily as needed for high blood sugar (at noon if CBG >200 and will be eating). (Patient taking differently: Inject 15 Units into the skin 2 (two) times daily before a meal. )  . Insulin Syringe-Needle U-100 (BD INSULIN SYRINGE U/F) 31G X 5/16" 0.3 ML MISC 10 Units by Does not apply route 3 (three) times daily before meals.  Elmore Guise Devices (LANCING DEVICE) MISC   . lisinopril (ZESTRIL) 40 MG tablet Take 1 tablet (40 mg total) by mouth daily.  . metoprolol succinate (TOPROL-XL) 50 MG 24 hr tablet TAKE 1 TABLET(50 MG) BY MOUTH DAILY WITH FOOD  . Multiple Vitamin (MULTIVITAMIN WITH MINERALS) TABS Take 1 tablet by mouth daily.  [name] neomycin-polymyxin-hydrocortisone (CORTISPORIN) 3.5-10000-1 OTIC suspension Place 4 drops into the left ear 3 (three) times daily as needed.  [name] NOVOLIN N RELION 100 UNIT/ML injection Inject 30-60 Units into the skin 2 (two) times daily. 50 units before breakfast and 50 units  before bedtime  . Omega-3 Fatty Acids (FISH OIL) 1000 MG CAPS Take by mouth.  . rosuvastatin (CRESTOR) 20 MG tablet Take 1 tablet (20 mg total) by mouth daily.  . TRUEplus Lancets 33G MISC USE AS DIRECTED 4 TIMES A Toren Tucholski  . vitamin E 400 UNIT capsule Take 400 Units by mouth daily.   No facility-administered encounter medications on file as of 07/07/2020.   SDOH Screenings   Alcohol Screen:   . Last Alcohol Screening Score (AUDIT): Not on file  Depression (PHQ2-9): Medium Risk  . PHQ-2  Score: 7  Financial Resource Strain: Medium Risk  . Difficulty of Paying Living Expenses: Somewhat hard  Food Insecurity: No Food Insecurity  . Worried About Charity fundraiser in the Last Year: Never true  . Ran Out of Food in the Last Year: Never true  Housing: Low Risk   . Last Housing Risk Score: 0  Physical Activity:   . Days of Exercise per Week: Not on file  . Minutes of Exercise per Session: Not on file  Social Connections:   . Frequency of Communication with Friends and Family: Not on file  . Frequency of Social Gatherings with Friends and Family: Not on file  . Attends Religious Services: Not on file  . Active Member of Clubs or Organizations: Not on file  . Attends Archivist Meetings: Not on file  . Marital Status: Not on file  Stress:   . Feeling of Stress : Not on file  Tobacco Use: Medium Risk  . Smoking Tobacco Use: Former Smoker  . Smokeless Tobacco Use: Never Used  Transportation Needs: No Transportation Needs  . Lack of Transportation (Medical): No  . Lack of Transportation (Non-Medical): No    Current Diagnosis/Assessment:  Goals Addressed   None   Social Hx:  Married to [employer] (also a CCM patient).  Has 5 children and over 40 grand, step-grand, and great grandchildren.  Her children are very important to her and motivate her to get things done. She loves cooking for them.  Her newest great grandchild was born on May 21, 2020 Her mother passed away in 12-11-2018 and patient was made executor of her mother's will. This has caused a lot of turmoil among family members.    Hypertension   BP goal is:  <130/80 (per Cardio)  Office blood pressures are  BP Readings from Last 3 Encounters:  05/24/20 (!) 129/50  05/23/20 130/70  05/17/20 120/60   Patient checks BP at home infrequently (last time was 2-3 weeks ago) Patient home BP readings are ranging: Unable to assess  Patient has failed these meds in the past: None noted  Patient is  currently controlled on the following medications:  . Amlodipine 19m daily . Lisinopril 496mdaily . Metoprolol Succinate 5047maily  Patient Reported Adherence States she is usually good about taking these. Will miss about once per week  We discussed BP goal  Plan -Check blood pressure 2-3 times per week and record -Continue current medications     Diabetes   Follows with Dr. BalChalmers Caterndo) about every 4 months Next appt on 07/29/20  A1c goal <7%  FBG goal: 80-130 PPBG goal: <180  Recent Relevant Labs: Lab Results  Component Value Date/Time   HGBA1C 10.0 (H) 06/15/2020 08:19 AM   HGBA1C 9.4 (H) 03/10/2020 02:10 PM   GFR 53.11 (L) 03/10/2020 02:10 PM   GFR 72.29 07/28/2019 02:50 PM   MICROALBUR 27.3 (H) 07/28/2019 02:50 PM  MICROALBUR 1.7 06/23/2015 10:21 AM    Last diabetic Eye exam: (noted April 2021) Lab Results  Component Value Date/Time   HMDIABEYEEXA Cataracts 07/08/2013 12:00 AM    Last diabetic Foot exam: No results found for: HMDIABFOOTEX (noted on 03/10/20 visit)  Checking BG: 5-6 times per week  Recent FBG Readings: Unable to assess Recent 2hr PP BG readings:  Unable to assess Recent HS BG readings: 325 last night (reports she had a busy Lauralynn Loeb and missed all of her medicine yesterday)  Patient has failed these meds in past: None noted  Patient is currently uncontrolled on the following medications: . Relion Novolin R 30 units daily as needed for high blood sugar (at noon if BG >200 and will be eating ) . Relion Novolin N 50 units before breakfast and 50 units before bedtime (prescribed as 30-60 units BID)  What happened to Lantus? Doesn't recall taking Humalog? Would get in the donut whole in August, so she would stop taking it.  Wakes up in the morning with low blood sugar taking Novolin N as prescribed, so she takes 50 units at each dose.  Does not use R daily. Shes 15-20 units of R. Might only give 15 units at night. She  "I am really afraid of the R"  Reports one time her BG was down to 29 when taking 25 units. States she is most comfortable with a dose of Novolin R 15 units per dose.  Gets N and R OTC for $24.88 per vial at [facility]  Patient reported Adherence Novolin N - 4/7 days takes both doses Novolin R - 5/7 days takes at least one dose  Reports she takes R at bedtime. Discussed that R should be taken with meals and N should be taken at bedtime. This could be a contributing factor to her nocturnal hypoglycemia. Last hypoglycemic episode was about 3 weeks ago. Woke up in the middle of the night.   We discussed: DM goals (a1c <7%, FBG 80-130, PPBG <180) , We discussed that Jardiance would have to be prescribed by Dr. Chalmers Cater, but that we would work on transitioning her insulin regimen before adding another oral medication.  Diet Eats twice daily B - eggs, sausage, sometimes grits, ham L - skips, but if she does eat mid [place] it is a usually a burger ([employer], [employer], [place]) D - Meat and a green vegetable and a starch most days Snacks - Bag of chips about twice a week Drinks - Lately drinking a lot of water, or diet pepsi or coke, occassionally juice and regular soda  Exercise Nothing outside of running errands. Planned to start a walking club, but her knees give her pain  Plan -Check blood sugar fasting each morning and 2 hour post prandial -Consistently take Novolin N 50 units twice daily before breakfast and before bedtime -Consistently take Novolin R 15 units before breakfast and before dinner -Titrate insulin as needed and create plan to transition to a basal/bolus regimen for more predictable pharmacodynamics of a long-acting and short-acting insulin vs Novolin N and Novolin R.   Future -Complete a patient assistance program for basal/bolus regimen Tyler Aas and Novolog) -Will coordinate with Dr. Almetta Lovely office to get patient transitioned appropriately  Hyperlipidemia/Hx of CAD   LDL goal <70  Lipid Panel       Component Value Date/Time   CHOL 131 06/15/2020 0819   TRIG 344 (H) 06/15/2020 0819   HDL 29 (L) 06/15/2020 0819   Bradley 61 06/15/2020 1194  LDLDIRECT 100.0 03/10/2020 1410    Hepatic Function Latest Ref Rng & Units 06/15/2020 03/10/2020 07/28/2019  Total Protein 6.1 - 8.1 g/dL 6.9 6.7 6.5  Albumin 3.5 - 5.2 g/dL - 4.4 4.3  AST 10 - 35 U/L '18 18 18  ' ALT 6 - 29 U/L '17 18 20  ' Alk Phosphatase 39 - 117 U/L - 39 62  Total Bilirubin 0.2 - 1.2 mg/dL 0.4 0.7 0.8  Bilirubin, Direct 0.0 - 0.3 mg/dL - - -     The 10-year ASCVD risk score Mikey Bussing DC Jr., et al., 2013) is: 18.7%   Values used to calculate the score:     Age: 70 years     Sex: Female     Is Non-Hispanic African American: Yes     Diabetic: Yes     Tobacco smoker: No     Systolic Blood Pressure: 944 mmHg     Is BP treated: Yes     HDL Cholesterol: 29 mg/dL     Total Cholesterol: 131 mg/dL   Patient has failed these meds in past: atorvastatin (inefficacy?), lovaza (listed in D/C meds. No apparent reason for D/C) Patient is currently uncontrolled on the following medications:  . Aspirin 64m daily . Fenofibrate 1667mdaily . Flaxseed Oil 1 capsule twice daily  . Omega 3 fish Oil 103063mwice daily . Rosuvastatin 13m81mily  Patient states she got confused with atorvastatin and rosuvastatin.  She denies taking both atorvastatin and rosuvastatin at same time, but would alternate between the two. She states she has been taking the rosuvastatin and fenofibrate. Encouraged patient to discard atorvastatin and switch to rosuvastatin.   We discussed:  How flaxseed oil and omega 3 oil may not be helpful for patient. Noting her elevated lipid panel will reassess this upon next lipid panel with change to rosuvastatin  Plan -Take rosuvastatin to replace atorvastatin daily   Tobacco Use Disorder   Tobacco Status:  Social History   Tobacco Use  Smoking Status Former Smoker  . Packs/Matti Minney: 0.20  . Types: Cigarettes  . Quit date:  05/05/2020  . Years since quitting: 0.1  Smokeless Tobacco Never Used    Patient smokes After 30 minutes of waking Patient triggers include: stress Is considered a former smoker, but will smoke a cigarette occasionally when stressed.  Previous quit attempts included: 2 (on most recent time this time. Previous time was 10 years ago and she stopped for 2-3 years, playing bingo reintroduced cigarettes in her life) Patient is currently controlled on the following medications:  . None  She stopped cold [place] 4 weeks ago. States she did smoke 1 cigarette last week.  She doesn't buy them anymore, but she got one from her son. 2 of her sons smoke. Will use food, watching a movie, or self control to replace urge to smoke. Has never used nicotine gum before.   We discussed:  Counseled on park & chew method for NRT gum. Discussed how if food, a movie, or a practicing self-control does not work she can try nicotine gum  Plan -Continue refraining from smoking   Depression/Anxiety   Depression screen PHQ [facility] 06/07/2020 05/12/2020 05/11/2019  Decreased Interest 2 0 0  Down, Depressed, Hopeless 1 0 0  PHQ - 2 Score 3 0 0  Altered sleeping 1 - -  Tired, decreased energy 1 - -  Change in appetite 1 - -  Feeling bad or failure about yourself  0 - -  Trouble concentrating 1 - -  Moving slowly or fidgety/restless 0 - -  Suicidal thoughts 0 - -  PHQ-9 Score 7 - -  Difficult doing work/chores Very difficult - -  Some recent data might be hidden    Patient has failed these meds in past: sertraline (palpitations) Patient is currently uncontrolled on the following medications:  . Alprazolam 0.38m three times daily as needed (takes about 2-3 times per week or less) . Fluoxetine 257mdaily   Last week was a bad week for her and she took xanax twice. She is dealing with family disagreements as she is the executor of her mother's will.  2020 was a rough year for her. She lost her mother,  nephew, brother in law, 3 friends, and 2 grandsons. Would like to give this more time before making change  We discussed:  Option of medication titration, but she would like to wait for now. Encouraged patient to reach out if further help is needed. She verbalizes understanding.  Plan -Continue current medications   Osteoporosis   Last DEXA Scan: 05/29/2017  T-Score femoral neck: -2.0  T-Score forearm radius: -2.5  No results found for: VD25OH   Patient has failed these meds in past: None noted  Patient is currently controlled on the following medications:  . Alendronate 7026meekly (Sunday)   Plan -Complete DEXA Scan -Continue current medications  Neuropathy    Patient has failed these meds in past: None noted  Patient is currently stable on the following medications: . Gabapentin 300m109mice to three times daily (not taking)   States she was taking this, but it didn't help.  Last dose was about 6 months ago  Plan -Continue current management  Future Plan -Discuss further at future visits. Will focus on DM control and see if this helps relieve symptoms  Medication Management   [facility] medications Uses pill box? Yes Pt endorses 80% compliance  She feels mail order pharmacy sends her more medication than she needs, so she would like to stop using mail order.  Discussed how [facility] could help coordinate her medications noting her husband is also receiving his medications from [facility] and she would like to transition her medications to [facility]  Plan -Utilize [facility] pharmacy for medication synchronization, packaging and delivery   Verbal consent obtained for [facility] enhanced pharmacy services (medication synchronization, adherence packaging, delivery coordination). A medication sync plan was created to allow patient to get all medications delivered once every 30 to 90 days per patient preference. Patient  understands they have freedom to choose pharmacy and clinical pharmacist will coordinate care between all prescribers and [facility].   Follow up:  2 week BG check in with [place] 1 month phone visit with KaneMelvenia Beamiscellaneous Meds Benzonatate 100mg68m about twice a daily Theratears Fluticasone 50mcg22mtivitamin Cortisporin Vitamin E 400 units daily (Was told this would help with her breast pain and it has been helpful for her. Discussed risk/benefit and feel this specific patient has greater benefit than risk noting it is on the lower end (<1000 units/Raunak Antuna))  Meds to D/C from list Ferrous Sulfate 325mg2m

## 2020-07-19 ENCOUNTER — Telehealth: Payer: Self-pay | Admitting: Pharmacist

## 2020-07-19 NOTE — Progress Notes (Addendum)
Chronic Care Management Pharmacy Assistant   Name: PAULEEN GOLEMAN  MRN: [id number] DOB: 1945-10-16  Reason for Encounter: Disease State  Patient Questions:  1.  Have you seen any other providers since your last visit? No  2.  Any changes in your medicines or health? No    PCP : Ann Held, DO   Their chronic conditions include:Hypertension, Hyperlipidemia/Hx of CAD, Diabetes, Tobacco Use Disorder, Depression/Anxiety, Osteoporosis, Neuropathy.  Office Visits: None since their last Disease State phone call with the clinical pharmacist assistant.  Consults: None since their last Disease State phone call with the clinical pharmacist assistant.  Allergies:   Allergies  Allergen Reactions  . Levemir [Insulin Detemir] Hives and Swelling  . Metformin And Related Other (See Comments)    Sick on stomach, sweaty, throw up, pain    Medications: Outpatient Encounter Medications as of 07/19/2020  Medication Sig  . Alcohol Swabs (B-D SINGLE USE SWABS REGULAR) PADS USE AS DIRECTED  . alendronate (FOSAMAX) 70 MG tablet Take with a full glass of water on an empty stomach.  . ALPRAZolam (XANAX) 0.25 MG tablet Take 1 tablet by mouth three times a day if needed  . amLODipine (NORVASC) 10 MG tablet Take 1 tablet (10 mg total) by mouth daily.  [name] aspirin EC 81 MG tablet Take 81 mg by mouth daily.  . benzonatate (TESSALON) 100 MG capsule Take 1 capsule (100 mg total) by mouth 3 (three) times daily as needed for cough.  . Blood Glucose Calibration (TAI DOC CONTROL) NORMAL SOLN   . Blood Glucose Monitoring Suppl (TRUE METRIX AIR GLUCOSE METER) w/Device KIT 1 each by Does not apply route 4 (four) times daily. DX CODE E11.8  . Carboxymethylcellulose Sodium (THERATEARS OP) Place 1 drop into both eyes daily as needed (dry eyes).  . fenofibrate 160 MG tablet Take 1 tablet (160 mg total) by mouth daily.  [name] FERRETTS 325 (106 Fe) MG TABS tablet Take 1 tablet by mouth daily. (Patient not  taking: Reported on 06/07/2020)  . ferrous sulfate 325 (65 FE) MG tablet Take 325 mg by mouth daily with breakfast. (Patient not taking: Reported on 06/07/2020)  . Flaxseed, Linseed, (FLAXSEED OIL PO) Take 1 capsule by mouth 2 (two) times daily.   [name] FLUoxetine (PROZAC) 20 MG capsule TAKE 1 CAPSULE EVERY DAY  . fluticasone (FLONASE) 50 MCG/ACT nasal spray Place 2 sprays into both nostrils daily as needed for allergies or rhinitis.  [name] gabapentin (NEURONTIN) 300 MG capsule Take 1 capsule (300 mg total) by mouth 2 (two) times daily. If tolerating may take 3 times per day if needed. (Patient not taking: Reported on 06/07/2020)  . glucose blood (TRUE METRIX BLOOD GLUCOSE TEST) test strip USE AS DIRECTED 4 TIMES A DAY  . insulin regular (NOVOLIN R RELION) 100 units/mL injection Inject 0.3 mLs (30 Units total) into the skin daily as needed for high blood sugar (at noon if CBG >200 and will be eating). (Patient taking differently: Inject 15 Units into the skin 2 (two) times daily before a meal. )  . Insulin Syringe-Needle U-100 (BD INSULIN SYRINGE U/F) 31G X 5/16" 0.3 ML MISC 10 Units by Does not apply route 3 (three) times daily before meals.  Elmore Guise Devices (LANCING DEVICE) MISC   . lisinopril (ZESTRIL) 40 MG tablet Take 1 tablet (40 mg total) by mouth daily.  . metoprolol succinate (TOPROL-XL) 50 MG 24 hr tablet TAKE 1 TABLET(50 MG) BY MOUTH DAILY WITH FOOD  .  Multiple Vitamin (MULTIVITAMIN WITH MINERALS) TABS Take 1 tablet by mouth daily.  [name] neomycin-polymyxin-hydrocortisone (CORTISPORIN) 3.5-10000-1 OTIC suspension Place 4 drops into the left ear 3 (three) times daily as needed.  [name] NOVOLIN N RELION 100 UNIT/ML injection Inject 30-60 Units into the skin 2 (two) times daily. 50 units before breakfast and 50 units before bedtime  . Omega-3 Fatty Acids (FISH OIL) 1000 MG CAPS Take by mouth.  . rosuvastatin (CRESTOR) 20 MG tablet Take 1 tablet (20 mg total) by mouth daily.  . TRUEplus Lancets 33G MISC USE AS  DIRECTED 4 TIMES A DAY  . vitamin E 400 UNIT capsule Take 400 Units by mouth daily.   No facility-administered encounter medications on file as of 07/19/2020.    Current Diagnosis: Patient Active Problem List   Diagnosis Date Noted  . Impacted cerumen of right ear 05/24/2020  . Bilateral impacted cerumen 05/17/2020  . Hyperlipidemia associated with type 2 diabetes mellitus ([place]) 03/10/2020  . Cough 03/10/2020  . Seasonal allergies 03/10/2020  . Elevated rheumatoid factor 07/21/2018  . Gastroenteritis 07/21/2018  . Trigger middle finger of left hand 12/19/2017  . Ganglion cyst of dorsum of right wrist 12/19/2017  . Morton's neuroma of right foot 12/19/2017  . Preventative health care 12/13/2017  . Pain in both hands 12/13/2017  . Hyperlipidemia LDL goal <100 12/13/2017  . Abscess of upper gum 12/13/2017  . Spinal stenosis of cervical region 09/25/2016  . Cervical radiculopathy 09/06/2016  . Dyspnea 02/18/2016  . Chest pain 02/18/2016  . HLD (hyperlipidemia) 02/18/2016  . CAD in native artery 02/18/2016  . RLS (restless legs syndrome) 02/18/2016  . Pancreatitis 02/18/2016  . SOB (shortness of breath) 02/18/2016  . Uncontrolled type 2 diabetes mellitus with complication ([place])   . Tobacco abuse   . Osteoarthritis of right knee 07/05/2015  . Smoking 07/02/2013  . Acute upper respiratory infections of unspecified site 07/02/2013  . Flu vaccine need 07/02/2013  . Multinodular goiter 04/16/2013  . Bunion 07/07/2012  . BACK PAIN, THORACIC REGION, RIGHT 05/24/2009  . IRON DEFICIENCY 03/25/2009  . GERD 03/25/2009  . WEIGHT LOSS 03/25/2009  . SINUSITIS- ACUTE-NOS 11/25/2008  . Hyperlipidemia LDL goal <70 10/07/2008  . ACUTE PANCREATITIS 09/07/2008  . OVARIAN CYST 09/07/2008  . KNEE PAIN, RIGHT 05/24/2008  . CARPAL TUNNEL SYNDROME, RIGHT 05/12/2008  . Acute pain of both shoulders 04/02/2008  . ROTATOR CUFF REPAIR, RIGHT, HX OF 03/29/2008  . GOITER, MULTINODULAR 12/26/2007  .  Depression 07/07/2007  . HIP PAIN, RIGHT 03/11/2007  . RESTLESS LEG SYNDROME, HX OF 03/11/2007  . Diabetes mellitus type II, uncontrolled ([place]) 03/07/2007  . Essential hypertension 03/07/2007    Goals Addressed   None    Recent Relevant Labs: Lab Results  Component Value Date/Time   HGBA1C 10.0 (H) 06/15/2020 08:19 AM   HGBA1C 9.4 (H) 03/10/2020 02:10 PM   MICROALBUR 27.3 (H) 07/28/2019 02:50 PM   MICROALBUR 1.7 06/23/2015 10:21 AM    Kidney Function Lab Results  Component Value Date/Time   CREATININE 1.04 (H) 06/15/2020 08:19 AM   CREATININE 1.20 03/10/2020 02:10 PM   CREATININE 0.92 07/28/2019 02:50 PM   GFR 53.11 (L) 03/10/2020 02:10 PM   GFRNONAA >60 02/18/2016 10:01 AM   GFRAA >60 02/18/2016 10:01 AM    . Current antihyperglycemic regimen:   Relion Novolin R 15 units twice daily with meals  Relion Novolin N 50 units twice daily  . What recent interventions/DTPs have been made to improve glycemic control:  o  None  . Have there been any recent hospitalizations or ED visits since last visit with CPP? No   . Patient reports hypoglycemic symptoms, including Nervous/irritable   . Patient reports hyperglycemic symptoms, including fatigue and polyuria   . How often are you checking your blood sugar? Patient states she usually checks it  once daily, but the past couple of days she hasn't because there has been a lot going on. She had a recent death in the family.  . What are your blood sugars ranging?  o Fasting bs: 204 was her reading today, 164 was the last reading on Sunday. Patient stated she had potatoes and gravy last night, and did not take her insulin last night or this morning. She is going to take it after our phone call.  . During the week, how often does your blood glucose drop below 70? Patient reports her blood sugar has ran below 70 before but it does not happen often.  . Are you checking your feet daily/regularly? Yes. She stated her feet are sensitive  at times when walking outside. She does get pedicures.  Adherence Review: Is the patient currently on a STATIN medication? Yes rosuvastatin 20 MG tablet Is the patient currently on ACE/ARB medication? Yes lisinopril 40 MG tablet Does the patient have >5 day gap between last estimated fill dates? Yes    Follow-Up:  Pharmacist Review and Scheduled Follow-Up With Clinical Pharmacist Tuesday December 7th at 9:30am over the telephone.   Fanny Skates, [place] Pharmacist Assistant [phone]  Noted >5 day gap history per dispense report. Need to assure adherence. -BKOR Fill Dates Per Dispense Report - Filled Inappropriately Rosuvastatin 74m - 03/15/20 90DS  Reviewed by: KDe Blanch PharmD Clinical Pharmacist LDividePrimary Care at [phone]

## 2020-07-23 ENCOUNTER — Other Ambulatory Visit: Payer: Self-pay | Admitting: Family Medicine

## 2020-07-29 ENCOUNTER — Other Ambulatory Visit: Payer: Self-pay | Admitting: Cardiology

## 2020-07-29 ENCOUNTER — Telehealth: Payer: Self-pay | Admitting: Pharmacist

## 2020-07-29 DIAGNOSIS — I10 Essential (primary) hypertension: Secondary | ICD-10-CM

## 2020-07-29 MED ORDER — LISINOPRIL 40 MG PO TABS: 40.0000 mg | ORAL_TABLET | Freq: Every day | ORAL | 3 refills | Status: DC

## 2020-07-29 NOTE — Telephone Encounter (Signed)
*  STAT* If patient is at the pharmacy, call can be transferred to refill team.   1. Which medications need to be refilled? (please list name of each medication and dose if known) lisinopril (ZESTRIL) 40 MG tablet  2. Which pharmacy/location (including street and city if local pharmacy) is medication to be sent to? [facility] - [place], [place] - [place] [id number]  3. Do they need a 30 day or 90 day supply? 90 day supply

## 2020-07-29 NOTE — Progress Notes (Addendum)
Chronic Care Management Pharmacy Assistant   Name: [name]  MRN: [id number] DOB: October 30, 1945  Reason for Encounter: Medication Review Contacted the patient to confirm medication needs based on medication needs reviewed with clinical pharmacist.   PCP : Carollee Herter, Alferd Apa, DO  Allergies:   Allergies  Allergen Reactions   Levemir [Insulin Detemir] Hives and Swelling   Metformin And Related Other (See Comments)    Sick on stomach, sweaty, throw up, pain    Medications: Outpatient Encounter Medications as of 07/29/2020  Medication Sig   Alcohol Swabs (B-D SINGLE USE SWABS REGULAR) PADS USE AS DIRECTED   alendronate (FOSAMAX) 70 MG tablet Take with a full glass of water on an empty stomach.   ALPRAZolam (XANAX) 0.25 MG tablet Take 1 tablet by mouth three times a day if needed   amLODipine (NORVASC) 10 MG tablet Take 1 tablet (10 mg total) by mouth daily.   aspirin EC 81 MG tablet Take 81 mg by mouth daily.   benzonatate (TESSALON) 100 MG capsule Take 1 capsule (100 mg total) by mouth 3 (three) times daily as needed for cough.   Blood Glucose Calibration (TAI DOC CONTROL) NORMAL SOLN    Blood Glucose Monitoring Suppl (TRUE METRIX AIR GLUCOSE METER) w/Device KIT 1 each by Does not apply route 4 (four) times daily. 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   fenofibrate 160 MG tablet Take 1 tablet (160 mg total) by mouth daily.   FERRETTS 325 (106 Fe) MG TABS tablet Take 1 tablet by mouth daily. (Patient not taking: Reported on 06/07/2020)   ferrous sulfate 325 (65 FE) MG tablet Take 325 mg by mouth daily with breakfast. (Patient not taking: Reported on 06/07/2020)   Flaxseed, Linseed, (FLAXSEED OIL PO) Take 1 capsule by mouth 2 (two) times daily.    FLUoxetine (PROZAC) 20 MG capsule TAKE 1 CAPSULE EVERY DAY   fluticasone (FLONASE) 50 MCG/ACT nasal spray Place 2 sprays into both nostrils daily as  needed for allergies or rhinitis.   gabapentin (NEURONTIN) 300 MG capsule Take 1 capsule (300 mg total) by mouth 2 (two) times daily. If tolerating may take 3 times per day if needed. (Patient not taking: Reported on 06/07/2020)   glucose blood (TRUE METRIX BLOOD GLUCOSE TEST) test strip USE AS DIRECTED 4 TIMES A DAY   insulin regular (NOVOLIN R RELION) 100 units/mL injection Inject 0.3 mLs (30 Units total) into the skin daily as needed for high blood sugar (at noon if CBG >200 and will be eating). (Patient taking differently: Inject 15 Units into the skin 2 (two) times daily before a meal. )   Insulin Syringe-Needle U-100 (BD INSULIN SYRINGE U/F) 31G X 5/16" 0.3 ML MISC 10 Units by Does not apply route 3 (three) times daily before meals.   Lancet Devices (LANCING DEVICE) MISC    lisinopril (ZESTRIL) 40 MG tablet Take 1 tablet (40 mg total) by mouth daily.   metoprolol succinate (TOPROL-XL) 50 MG 24 hr tablet TAKE 1 TABLET(50 MG) BY MOUTH DAILY WITH FOOD   Multiple Vitamin (MULTIVITAMIN WITH MINERALS) TABS Take 1 tablet by mouth daily.   neomycin-polymyxin-hydrocortisone (CORTISPORIN) 3.5-10000-1 OTIC suspension Place 4 drops into the left ear 3 (three) times daily as needed.   NOVOLIN N RELION 100 UNIT/ML injection Inject 30-60 Units into the skin 2 (two) times daily. 50 units before breakfast and 50 units before bedtime  Omega-3 Fatty Acids (FISH OIL) 1000 MG CAPS Take by mouth.   rosuvastatin (CRESTOR) 20 MG tablet TAKE 1 TABLET(20 MG) BY MOUTH DAILY   TRUEplus Lancets 33G MISC USE AS DIRECTED 4 TIMES A DAY   vitamin E 400 UNIT capsule Take 400 Units by mouth daily.   No facility-administered encounter medications on file as of 07/29/2020.    Current Diagnosis: Patient Active Problem List   Diagnosis Date Noted   Impacted cerumen of right ear 05/24/2020   Bilateral impacted cerumen 05/17/2020   Hyperlipidemia associated with type 2 diabetes mellitus ([place]) 03/10/2020   Cough 03/10/2020    Seasonal allergies 03/10/2020   Elevated rheumatoid factor 07/21/2018   Gastroenteritis 07/21/2018   Trigger middle finger of left hand 12/19/2017   Ganglion cyst of dorsum of right wrist 12/19/2017   Morton's neuroma of right foot 12/19/2017   Preventative health care 12/13/2017   Pain in both hands 12/13/2017   Hyperlipidemia LDL goal <100 12/13/2017   Abscess of upper gum 12/13/2017   Spinal stenosis of cervical region 09/25/2016   Cervical radiculopathy 09/06/2016   Dyspnea 02/18/2016   Chest pain 02/18/2016   HLD (hyperlipidemia) 02/18/2016   CAD in native artery 02/18/2016   RLS (restless legs syndrome) 02/18/2016   Pancreatitis 02/18/2016   SOB (shortness of breath) 02/18/2016   Uncontrolled type 2 diabetes mellitus with complication ([place])    Tobacco abuse    Osteoarthritis of right knee 07/05/2015   Smoking 07/02/2013   Acute upper respiratory infections of unspecified site 07/02/2013   Flu vaccine need 07/02/2013   Multinodular goiter 04/16/2013   Bunion 07/07/2012   BACK PAIN, THORACIC REGION, RIGHT 05/24/2009   IRON DEFICIENCY 03/25/2009   GERD 03/25/2009   WEIGHT LOSS 03/25/2009   SINUSITIS- ACUTE-NOS 11/25/2008   Hyperlipidemia LDL goal <70 10/07/2008   ACUTE PANCREATITIS 09/07/2008   OVARIAN CYST 09/07/2008   KNEE PAIN, RIGHT 05/24/2008   CARPAL TUNNEL SYNDROME, RIGHT 05/12/2008   Acute pain of both shoulders 04/02/2008   ROTATOR CUFF REPAIR, RIGHT, HX OF 03/29/2008   GOITER, MULTINODULAR 12/26/2007   Depression 07/07/2007   HIP PAIN, RIGHT 03/11/2007   RESTLESS LEG SYNDROME, HX OF 03/11/2007   Diabetes mellitus type II, uncontrolled ([place]) 03/07/2007   Essential hypertension 03/07/2007    Goals Addressed   None    Reviewed chart for medication changes ahead of medication coordination call.  No OVs, Consults, or hospital visits since last care coordination call.   No medication changes indicated.  BP Readings from Last 3 Encounters:  05/24/20 (!)  129/50  05/23/20 130/70  05/17/20 120/60    Lab Results  Component Value Date   HGBA1C 10.0 (H) 06/15/2020     Patient obtains medications through Adherence Packaging  90 Days   The patient has not received an adherence delivery prior to this delivery.  Called patient and reviewed medications and coordinated delivery.  Coordinated acute fill for the following medications: Rosuvastatin 20 mg (trying to get synced for 09/02/20) Amlodipine 10 mg (trying to get synced for 09/02/20)  Patient will need the following medications in adherence packaging in the near future. Fenofibrate 160 mg; one tab with Breakfast Alendronate 70 mg; one tab weekly before Breakfast (Sunday) Metoprolol Succinate 50 mg; one tab with Breakfast Lisinopril 40 mg: one tab with Breakfast Fluoxetine 20 mg: one tab with Breakfast  Patient needs [facility] to [facility] for Lisinopril 40 mg. Contacted Dr. Judeth Cornfield office for a refill request.Spoke with Enis Gash who stated  she would forward my request to the nurse. Also requested prescription of Alprazolam 0.70m and Metoprolol Succinate 50 mg tabs to be sent from Dr. LCarollee Herter  Confirmed delivery date of 07-29-2020, advised patient that pharmacy will contact them the morning of delivery.    Follow-Up:  Coordination of Enhanced Pharmacy Services   IFanny Skates CSacramentoPharmacist Assistant [phone] Reviewed by: KDe Blanch PharmD Clinical Pharmacist LSanta MariaPrimary Care at [phone]

## 2020-08-05 DIAGNOSIS — E7800 Pure hypercholesterolemia, unspecified: Secondary | ICD-10-CM | POA: Diagnosis not present

## 2020-08-05 DIAGNOSIS — E1121 Type 2 diabetes mellitus with diabetic nephropathy: Secondary | ICD-10-CM | POA: Diagnosis not present

## 2020-08-05 DIAGNOSIS — R809 Proteinuria, unspecified: Secondary | ICD-10-CM | POA: Diagnosis not present

## 2020-08-05 DIAGNOSIS — E1140 Type 2 diabetes mellitus with diabetic neuropathy, unspecified: Secondary | ICD-10-CM | POA: Diagnosis not present

## 2020-08-05 DIAGNOSIS — E1165 Type 2 diabetes mellitus with hyperglycemia: Secondary | ICD-10-CM | POA: Diagnosis not present

## 2020-08-05 DIAGNOSIS — E049 Nontoxic goiter, unspecified: Secondary | ICD-10-CM | POA: Diagnosis not present

## 2020-08-05 DIAGNOSIS — I10 Essential (primary) hypertension: Secondary | ICD-10-CM | POA: Diagnosis not present

## 2020-08-11 ENCOUNTER — Ambulatory Visit: Payer: Medicare HMO | Admitting: Family Medicine

## 2020-08-12 ENCOUNTER — Ambulatory Visit: Payer: Medicare HMO | Admitting: Family Medicine

## 2020-08-12 DIAGNOSIS — Z0289 Encounter for other administrative examinations: Secondary | ICD-10-CM

## 2020-08-15 ENCOUNTER — Other Ambulatory Visit: Payer: Self-pay

## 2020-08-15 ENCOUNTER — Ambulatory Visit (INDEPENDENT_AMBULATORY_CARE_PROVIDER_SITE_OTHER): Payer: Medicare HMO | Admitting: Family

## 2020-08-15 ENCOUNTER — Encounter: Payer: Self-pay | Admitting: Family

## 2020-08-15 VITALS — BP 140/61 | HR 51 | Temp 98.2°F | Resp 16 | Ht 65.0 in | Wt 177.0 lb

## 2020-08-15 DIAGNOSIS — M545 Low back pain, unspecified: Secondary | ICD-10-CM

## 2020-08-15 DIAGNOSIS — M25511 Pain in right shoulder: Secondary | ICD-10-CM | POA: Diagnosis not present

## 2020-08-15 DIAGNOSIS — M25561 Pain in right knee: Secondary | ICD-10-CM

## 2020-08-15 DIAGNOSIS — G8929 Other chronic pain: Secondary | ICD-10-CM

## 2020-08-15 MED ORDER — MELOXICAM 7.5 MG PO TABS: 7.5000 mg | ORAL_TABLET | Freq: Every day | ORAL | 0 refills | Status: DC

## 2020-08-15 NOTE — Progress Notes (Signed)
Subjective:    Patient ID: [name], female    DOB: Feb 27, 1946, 74 y.o.   MRN: [id number]  HPI  Patient is a 74 yr old female who presents today with multiple orthopedic concerns.   R knee pain- she reports that this is a chronic issue which is at baseline.   She reports that 2 weeks ago she was trying to help transfer a patient and developed back and right shoulder pain.  She works as a Emergency planning/management officer.  She has been using otc tylenol.  Pain is in the lower back.  Pain is non-radiating. She reports that right leg has been weak for some time.  Left leg is also feeling weak recently.  Back pain worse with bending.  Hurts to sleep on her right shoulder.  Reports that she has been having pain since she lifted a patient 2 weeks ago. She is not currently working with an orthopedic doctor.    Review of Systems    see HPI  Past Medical History:  Diagnosis Date  . Blockage of coronary artery of heart ([place])   . Depression   . Diabetes mellitus   . Hyperlipidemia   . Hypertension   . Pancreatitis   . Restless leg syndrome      Social History   Socioeconomic History  . Marital status: Married    Spouse name: Not on file  . Number of children: Not on file  . Years of education: Not on file  . Highest education level: Not on file  Occupational History  . Not on file  Tobacco Use  . Smoking status: Former Smoker    Packs/day: 0.20    Types: Cigarettes    Quit date: 05/05/2020    Years since quitting: 0.2  . Smokeless tobacco: Never Used  Vaping Use  . Vaping Use: Never used  Substance and Sexual Activity  . Alcohol use: No  . Drug use: No  . Sexual activity: Not on file  Other Topics Concern  . Not on file  Social History Narrative   Exercise: seldom   Caffeine use: seldom   Social Determinants of Health   Financial Resource Strain: Medium Risk  . Difficulty of Paying Living Expenses: Somewhat hard  Food Insecurity: No Food Insecurity  . Worried About Paediatric nurse in the Last Year: Never true  . Ran Out of Food in the Last Year: Never true  Transportation Needs: No Transportation Needs  . Lack of Transportation (Medical): No  . Lack of Transportation (Non-Medical): No  Physical Activity:   . Days of Exercise per Week: Not on file  . Minutes of Exercise per Session: Not on file  Stress:   . Feeling of Stress : Not on file  Social Connections:   . Frequency of Communication with Friends and Family: Not on file  . Frequency of Social Gatherings with Friends and Family: Not on file  . Attends Religious Services: Not on file  . Active Member of Clubs or Organizations: Not on file  . Attends Archivist Meetings: Not on file  . Marital Status: Not on file  Intimate Partner Violence:   . Fear of Current or Ex-Partner: Not on file  . Emotionally Abused: Not on file  . Physically Abused: Not on file  . Sexually Abused: Not on file    Past Surgical History:  Procedure Laterality Date  . CATARACT EXTRACTION, BILATERAL    . CORONARY ANGIOPLASTY WITH STENT PLACEMENT    .  FOOT SURGERY Right    cyst removal  . ROTATOR CUFF REPAIR     right  . ROTATOR CUFF REPAIR Left   . TUBAL LIGATION      Family History  Problem Relation Age of Onset  . Heart attack Father   . Heart attack Brother   . Diabetes Sister   . Cirrhosis Sister   . Hypertension Sister   . Cancer Mother   . Hypertension Mother   . Vision loss Mother   . Cancer Sister   . Sleep apnea Sister   . Heart Problems Sister   . Diabetes Sister   . Gout Sister   . Diabetes Son   . Heart attack Son   . Diabetes Daughter   . Goiter Daughter   . Diabetes Daughter     Allergies  Allergen Reactions  . Levemir [Insulin Detemir] Hives and Swelling  . Metformin And Related Other (See Comments)    Sick on stomach, sweaty, throw up, pain    Current Outpatient Medications on File Prior to Visit  Medication Sig Dispense Refill  . Alcohol Swabs (B-D SINGLE USE SWABS  REGULAR) PADS USE AS DIRECTED 400 each 1  . alendronate (FOSAMAX) 70 MG tablet Take with a full glass of water on an empty stomach. 12 tablet 2  . ALPRAZolam (XANAX) 0.25 MG tablet Take 1 tablet by mouth three times a day if needed 60 tablet 0  . amLODipine (NORVASC) 10 MG tablet Take 1 tablet (10 mg total) by mouth daily. 90 tablet 1  . aspirin EC 81 MG tablet Take 81 mg by mouth daily.    . Blood Glucose Calibration (TAI DOC CONTROL) NORMAL SOLN     . Blood Glucose Monitoring Suppl (TRUE METRIX AIR GLUCOSE METER) w/Device KIT 1 each by Does not apply route 4 (four) times daily. DX CODE E11.8 1 kit 0  . Carboxymethylcellulose Sodium (THERATEARS OP) Place 1 drop into both eyes daily as needed (dry eyes).    . fenofibrate 160 MG tablet Take 1 tablet (160 mg total) by mouth daily. 90 tablet 1  . FERRETTS 325 (106 Fe) MG TABS tablet Take 1 tablet by mouth daily.     . ferrous sulfate 325 (65 FE) MG tablet Take 325 mg by mouth daily with breakfast.     . Flaxseed, Linseed, (FLAXSEED OIL PO) Take 1 capsule by mouth 2 (two) times daily.     [name] FLUoxetine (PROZAC) 20 MG capsule TAKE 1 CAPSULE EVERY DAY 90 capsule 1  . fluticasone (FLONASE) 50 MCG/ACT nasal spray Place 2 sprays into both nostrils daily as needed for allergies or rhinitis.    [name] glucose blood (TRUE METRIX BLOOD GLUCOSE TEST) test strip USE AS DIRECTED 4 TIMES A DAY 400 strip 1  . insulin regular (NOVOLIN R RELION) 100 units/mL injection Inject 0.3 mLs (30 Units total) into the skin daily as needed for high blood sugar (at noon if CBG >200 and will be eating). (Patient taking differently: Inject 15 Units into the skin 2 (two) times daily before a meal. ) 10 mL   . Insulin Syringe-Needle U-100 (BD INSULIN SYRINGE U/F) 31G X 5/16" 0.3 ML MISC 10 Units by Does not apply route 3 (three) times daily before meals. 100 each 0  . Lancet Devices (LANCING DEVICE) MISC     . lisinopril (ZESTRIL) 40 MG tablet Take 1 tablet (40 mg total) by mouth daily. 90  tablet 3  . metoprolol succinate (TOPROL-XL) 50 MG 24  hr tablet TAKE 1 TABLET(50 MG) BY MOUTH DAILY WITH FOOD 90 tablet 0  . Multiple Vitamin (MULTIVITAMIN WITH MINERALS) TABS Take 1 tablet by mouth daily.    [name] neomycin-polymyxin-hydrocortisone (CORTISPORIN) 3.5-10000-1 OTIC suspension Place 4 drops into the left ear 3 (three) times daily as needed.    [name] NOVOLIN N RELION 100 UNIT/ML injection Inject 30-60 Units into the skin 2 (two) times daily. 50 units before breakfast and 50 units before bedtime    . Omega-3 Fatty Acids (FISH OIL) 1000 MG CAPS Take by mouth.    . rosuvastatin (CRESTOR) 20 MG tablet TAKE 1 TABLET(20 MG) BY MOUTH DAILY 30 tablet 0  . TRUEplus Lancets 33G MISC USE AS DIRECTED 4 TIMES A DAY 400 each 1  . vitamin E 400 UNIT capsule Take 400 Units by mouth daily.     No current facility-administered medications on file prior to visit.    BP 140/61 (BP Location: Right Arm, Patient Position: Sitting, Cuff Size: Small)   Pulse (!) 51   Temp 98.2 F (36.8 C) (Oral)   Resp 16   Ht '5\' 5"'  (1.651 m)   Wt 177 lb (80.3 kg)   SpO2 100%   BMI 29.45 kg/m    Objective:   Physical Exam Constitutional:      Appearance: She is well-developed.  Neck:     Thyroid: No thyromegaly.  Cardiovascular:     Rate and Rhythm: Normal rate and regular rhythm.     Heart sounds: Normal heart sounds. No murmur heard.   Pulmonary:     Effort: Pulmonary effort is normal. No respiratory distress.     Breath sounds: Normal breath sounds. No wheezing.  Musculoskeletal:     Cervical back: Neck supple.     Comments: + shoulder pain with abduction of the right arm  Skin:    General: Skin is warm and dry.  Neurological:     Mental Status: She is alert and oriented to person, place, and time.  Psychiatric:        Behavior: Behavior normal.        Thought Content: Thought content normal.        Judgment: Judgment normal.           Assessment & Plan:  R shoulder pain- ? Rotator cuff strain-  will refer to ortho.  Chronic right knee pain- refer to ortho.  Low back pain- will initiated meloxicam once daily. She is advised on red flags that should prompt return.   This visit occurred during the SARS-CoV-2 public health emergency.  Safety protocols were in place, including screening questions prior to the visit, additional usage of staff PPE, and extensive cleaning of exam room while observing appropriate contact time as indicated for disinfecting solutions.

## 2020-08-15 NOTE — Patient Instructions (Signed)
Please begin meloxicam once daily (anti-inflammatory). You should be contacted about your referral to orthopedics.   Please call us if your back pain/shoulder pain is not improved in 2 weeks.

## 2020-08-18 ENCOUNTER — Other Ambulatory Visit: Payer: Self-pay

## 2020-08-18 ENCOUNTER — Ambulatory Visit (INDEPENDENT_AMBULATORY_CARE_PROVIDER_SITE_OTHER): Payer: Medicare HMO | Admitting: Orthopaedic Surgery

## 2020-08-18 ENCOUNTER — Encounter: Payer: Self-pay | Admitting: Orthopaedic Surgery

## 2020-08-18 ENCOUNTER — Ambulatory Visit (INDEPENDENT_AMBULATORY_CARE_PROVIDER_SITE_OTHER): Payer: Medicare HMO

## 2020-08-18 DIAGNOSIS — M25511 Pain in right shoulder: Secondary | ICD-10-CM

## 2020-08-18 DIAGNOSIS — M1711 Unilateral primary osteoarthritis, right knee: Secondary | ICD-10-CM

## 2020-08-18 MED ORDER — LIDOCAINE HCL 1 % IJ SOLN
3.0000 mL | INTRAMUSCULAR | Status: AC | PRN
  Administered 2020-08-18: 3 mL

## 2020-08-18 MED ORDER — BUPIVACAINE HCL 0.5 % IJ SOLN
3.0000 mL | INTRAMUSCULAR | Status: AC | PRN
  Administered 2020-08-18: 3 mL via INTRA_ARTICULAR

## 2020-08-18 MED ORDER — METHYLPREDNISOLONE ACETATE 40 MG/ML IJ SUSP
40.0000 mg | INTRAMUSCULAR | Status: AC | PRN
  Administered 2020-08-18: 40 mg via INTRA_ARTICULAR

## 2020-08-18 NOTE — Progress Notes (Signed)
Office Visit Note   Patient: [name]           Date of Birth: [birth year]           MRN: [id number] Visit Date: 08/18/2020              Requested by: Sandford Craze, NP [address] [place],  [place] [address] PCP: [name], [name], DO   Assessment & Plan: Visit Diagnoses:  1. Acute pain of right shoulder   2. Primary osteoarthritis of right knee     Plan: Impression is right shoulder pain concerning for a partial tear of the rotator cuff and chronic right knee DJD.  For the shoulder I have recommended a subacromial injection and course of outpatient PT for 6 weeks given the fact that she has decent strength and function and her age.  Because her diabetes is not under great control I recommended waiting a couple weeks before injecting the right knee with cortisone.  Follow-Up Instructions: Return if symptoms worsen or fail to improve.   Orders:  Orders Placed This Encounter  Procedures  . XR Shoulder Right  . XR KNEE 3 VIEW RIGHT  . Ambulatory referral to Physical Therapy   No orders of the defined types were placed in this encounter.     Procedures: Large Joint Inj: R subacromial bursa on 08/18/2020 10:23 AM Indications: pain Details: 22 G needle  Arthrogram: No  Medications: 3 mL lidocaine 1 %; 3 mL bupivacaine 0.5 %; 40 mg methylPREDNISolone acetate 40 MG/ML Outcome: tolerated well, no immediate complications Consent was given by the patient. Patient was prepped and draped in the usual sterile fashion.       Clinical Data: No additional findings.   Subjective: Chief Complaint  Patient presents with  . Right Shoulder - Pain  . Right Knee - Pain    [name] is a 74 year old female comes in for evaluation of right shoulder and right knee pain.  For the right shoulder she recalls an injury to her right shoulder on 08/01/2020 when she was pulling a patient out of bed and felt a slight pop in her shoulder.  Since then she has had  decreased strength and range of motion limited by pain.  Denies any numbness and tingling.  To take Tylenol for the pain.  She has difficulty raising her arm.   Review of Systems  Constitutional: Negative.   HENT: Negative.   Eyes: Negative.   Respiratory: Negative.   Cardiovascular: Negative.   Endocrine: Negative.   Musculoskeletal: Negative.   Neurological: Negative.   Hematological: Negative.   Psychiatric/Behavioral: Negative.   All other systems reviewed and are negative.    Objective: Vital Signs: There were no vitals taken for this visit.  Physical Exam Vitals and nursing note reviewed.  Constitutional:      Appearance: She is well-developed.  Pulmonary:     Effort: Pulmonary effort is normal.  Skin:    General: Skin is warm.     Capillary Refill: Capillary refill takes less than 2 seconds.  Neurological:     Mental Status: She is alert and oriented to person, place, and time.  Psychiatric:        Behavior: Behavior normal.        Thought Content: Thought content normal.        Judgment: Judgment normal.     Ortho Exam Right shoulder shows mild limitation of range of motion with moderate pain.  Manual  muscle testing the rotator cuff shows mild weakness limited by pain.  Positive Hawkins sign.  Shoulder does not shrug with abduction.  Right knee shows no joint effusion.  1+ patellofemoral crepitus.  Collaterals and cruciates are stable.  Specialty Comments:  No specialty comments available.  Imaging: XR KNEE 3 VIEW RIGHT  Result Date: 08/18/2020 Moderate to severe tricompartmental OA  XR Shoulder Right  Result Date: 08/18/2020 Mild degenerative changes of the [facility] joint glenohumeral joint.  No acute abnormalities.    PMFS History: Patient Active Problem List   Diagnosis Date Noted  . Acute pain of right shoulder 08/18/2020  . Primary osteoarthritis of right knee 08/18/2020  . Impacted cerumen of right ear 05/24/2020  . Bilateral impacted cerumen  05/17/2020  . Hyperlipidemia associated with type 2 diabetes mellitus ([facility]) 03/10/2020  . Cough 03/10/2020  . Seasonal allergies 03/10/2020  . Elevated rheumatoid factor 07/21/2018  . Gastroenteritis 07/21/2018  . Trigger middle finger of left hand 12/19/2017  . Ganglion cyst of dorsum of right wrist 12/19/2017  . Morton's neuroma of right foot 12/19/2017  . Preventative health care 12/13/2017  . Pain in both hands 12/13/2017  . Hyperlipidemia LDL goal <100 12/13/2017  . Abscess of upper gum 12/13/2017  . Spinal stenosis of cervical region 09/25/2016  . Cervical radiculopathy 09/06/2016  . Dyspnea 02/18/2016  . Chest pain 02/18/2016  . HLD (hyperlipidemia) 02/18/2016  . CAD in native artery 02/18/2016  . RLS (restless legs syndrome) 02/18/2016  . Pancreatitis 02/18/2016  . SOB (shortness of breath) 02/18/2016  . Uncontrolled type 2 diabetes mellitus with complication ([facility]CC)   . Tobacco abuse   . Osteoarthritis of right knee 07/05/2015  . Smoking 07/02/2013  . Acute upper respiratory infections of unspecified site 07/02/2013  . Flu vaccine need 07/02/2013  . Multinodular goiter 04/16/2013  . Bunion 07/07/2012  . BACK PAIN, THORACIC REGION, RIGHT 05/24/2009  . IRON DEFICIENCY 03/25/2009  . GERD 03/25/2009  . WEIGHT LOSS 03/25/2009  . SINUSITIS- ACUTE-NOS 11/25/2008  . Hyperlipidemia LDL goal <70 10/07/2008  . ACUTE PANCREATITIS 09/07/2008  . OVARIAN CYST 09/07/2008  . KNEE PAIN, RIGHT 05/24/2008  . CARPAL TUNNEL SYNDROME, RIGHT 05/12/2008  . Acute pain of both shoulders 04/02/2008  . ROTATOR CUFF REPAIR, RIGHT, HX OF 03/29/2008  . GOITER, MULTINODULAR 12/26/2007  . Depression 07/07/2007  . HIP PAIN, RIGHT 03/11/2007  . RESTLESS LEG SYNDROME, HX OF 03/11/2007  . Diabetes mellitus type II, uncontrolled (HCC) 03/07/2007  . Essential hypertension 03/07/2007   Past Medical History:  Diagnosis Date  . Blockage of coronary artery of heart ([facility])   . Depression   . Diabetes  mellitus   . Hyperlipidemia   . Hypertension   . Pancreatitis   . Restless leg syndrome     Family History  Problem Relation Age of Onset  . Heart attack Father   . Heart attack Brother   . Diabetes Sister   . Cirrhosis Sister   . Hypertension Sister   . Cancer Mother   . Hypertension Mother   . Vision loss Mother   . Cancer Sister   . Sleep apnea Sister   . Heart Problems Sister   . Diabetes Sister   . Gout Sister   . Diabetes Son   . Heart attack Son   . Diabetes Daughter   . Goiter Daughter   . Diabetes Daughter     Past Surgical History:  Procedure Laterality Date  . CATARACT EXTRACTION, BILATERAL    .  CORONARY ANGIOPLASTY WITH STENT PLACEMENT    . FOOT SURGERY Right    cyst removal  . ROTATOR CUFF REPAIR     right  . ROTATOR CUFF REPAIR Left   . TUBAL LIGATION     Social History   Occupational History  . Not on file  Tobacco Use  . Smoking status: Former Smoker    Packs/day: 0.20    Types: Cigarettes    Quit date: 05/05/2020    Years since quitting: 0.2  . Smokeless tobacco: Never Used  Vaping Use  . Vaping Use: Never used  Substance and Sexual Activity  . Alcohol use: No  . Drug use: No  . Sexual activity: Not on file

## 2020-08-22 ENCOUNTER — Telehealth: Payer: Self-pay | Admitting: Family Medicine

## 2020-08-22 NOTE — Telephone Encounter (Signed)
Patient is requesting that a cough syrup be sent to her local pharmacy has had mediation prescribed in the past but can not remember the name of it.   Please advise

## 2020-08-22 NOTE — Telephone Encounter (Signed)
Pt needs a virtual visit if having cough sxs please

## 2020-08-23 ENCOUNTER — Other Ambulatory Visit: Payer: Self-pay | Admitting: Medical

## 2020-08-23 ENCOUNTER — Telehealth (INDEPENDENT_AMBULATORY_CARE_PROVIDER_SITE_OTHER): Payer: Medicare HMO | Admitting: Medical

## 2020-08-23 ENCOUNTER — Other Ambulatory Visit: Payer: Self-pay

## 2020-08-23 VITALS — BP 153/83

## 2020-08-23 DIAGNOSIS — J3489 Other specified disorders of nose and nasal sinuses: Secondary | ICD-10-CM

## 2020-08-23 DIAGNOSIS — J3089 Other allergic rhinitis: Secondary | ICD-10-CM

## 2020-08-23 DIAGNOSIS — R059 Cough, unspecified: Secondary | ICD-10-CM

## 2020-08-23 MED ORDER — FLUTICASONE PROPIONATE 50 MCG/ACT NA SUSP: 2.0000 | Freq: Every day | NASAL | 1 refills | Status: DC

## 2020-08-23 MED ORDER — HYDROCODONE-HOMATROPINE 5-1.5 MG/5ML PO SYRP: 5.0000 mL | ORAL_SOLUTION | Freq: Four times a day (QID) | ORAL | 0 refills | Status: DC | PRN

## 2020-08-23 MED ORDER — AZITHROMYCIN 250 MG PO TABS: ORAL_TABLET | ORAL | 0 refills | Status: DC

## 2020-08-23 MED ORDER — LEVOCETIRIZINE DIHYDROCHLORIDE 5 MG PO TABS: 5.0000 mg | ORAL_TABLET | Freq: Every evening | ORAL | 0 refills | Status: DC

## 2020-08-23 NOTE — Progress Notes (Signed)
Subjective:    Patient ID: [name], female    DOB: 08/19/1946, 74 y.o.   MRN: [id number]  HPI  Virtual Visit via Telephone Note  I connected with [name] on 08/23/20 at 11:20 AM EST by telephone and verified that I am speaking with the correct person using two identifiers.  Location: Patient: home Provider: office  Participants- pt and myself.   Pt did not check bp or pulse. States her battery ran out.    I discussed the limitations, risks, security and privacy concerns of performing an evaluation and management service by telephone and the availability of in person appointments. I also discussed with the patient that there may be a patient responsible charge related to this service. The patient expressed understanding and agreed to proceed.    History of Present Illness: Pt has cough for past 2 weeks. Also states she has history of intermittent chronic cough in the past. She states in past dry cough would come on most of time once a year. Other times 2 times. She has some pnd. Cough is dry.She has some itchy eyes and occasional sneeze. Sometimes cough is waking her up.  In the past pt states pcp had to rx cough syrup to stop the cough.  Pt has had phizer 2 shot series.         Observations/Objective: General- alert and oriented. Normal speech. No labored pattern. heent- on self palpation mild frontal sinus pressure.    Assessment and Plan: Recent 2 weeks of cough with associated nasal congestion, postnasal drainage and occasional sneeze.  Probable allergic rhinitis causing cough.  Will prescribe Xyzal antihistamine, Flonase nasal spray and making Hycodan available based on described severity.  Also notes that typically if severe cough will resolve illness from cough suppressant type med given.  Rx advisement given on Hycodan.   Some mild frontal sinus pressure reported.  Virtual telephone visit so at some disadvantage in knowing whether or not  prescribe antibiotic.  Counseled patient that we will make antibiotic available but not refill unless sinus pressure worsens or bronchitis type signs/symptoms.  Patient expressed understanding.  Sent azithromycin to pharmacy.  Follow-up in 7 days or as needed.  Time spent with patient today was 20  minutes which consisted of chart review, discussing diagnosis,  treatment and documentation.  Follow Up Instructions:    I discussed the assessment and treatment plan with the patient. The patient was provided an opportunity to ask questions and all were answered. The patient agreed with the plan and demonstrated an understanding of the instructions.   The patient was advised to call back or seek an in-person evaluation if the symptoms worsen or if the condition fails to improve as anticipated.  Time spent with patient today was   minutes which consisted of chart revdiew, discussing diagnosis, work up treatment and documentation.   Esperanza Richters, PA-C    Review of Systems  Constitutional: Positive for chills and fever. Negative for fatigue.  HENT: Positive for congestion and postnasal drip. Negative for ear pain, sinus pressure, sinus pain and sore throat.        Frontal sinus pressure. Mild.  Some intermittent nasal congestion.  No change in smell or taste.  Respiratory: Positive for cough. Negative for shortness of breath and wheezing.   Cardiovascular: Negative for chest pain and palpitations.  Gastrointestinal: Negative for abdominal pain.  Musculoskeletal: Negative for back pain, myalgias and neck pain.  Skin: Negative for rash.  Objective:   Physical Exam        Assessment & Plan:

## 2020-08-23 NOTE — Patient Instructions (Signed)
Recent 2 weeks of cough with associated nasal congestion, postnasal drainage and occasional sneeze.  Probable allergic rhinitis causing cough.  Will prescribe Xyzal antihistamine, Flonase nasal spray and making Hycodan available based on described severity.  Also notes that typically if severe cough will resolve illness from cough suppressant type med given.  Rx advisement given on Hycodan.   Some mild frontal sinus pressure reported.  Virtual telephone visit so at some disadvantage in knowing whether or not prescribe antibiotic.  Counseled patient that we will make antibiotic available but not refill unless sinus pressure worsens or bronchitis type signs/symptoms.  Patient expressed understanding.  Sent azithromycin to pharmacy.  Follow-up in 7 days or as needed.

## 2020-08-24 ENCOUNTER — Telehealth: Payer: Self-pay

## 2020-08-24 ENCOUNTER — Other Ambulatory Visit: Payer: Self-pay | Admitting: Physician Assistant

## 2020-08-24 MED ORDER — TRAMADOL HCL 50 MG PO TABS
50.0000 mg | ORAL_TABLET | Freq: Three times a day (TID) | ORAL | 0 refills | Status: DC | PRN
Start: 2020-08-24 — End: 2021-01-05

## 2020-08-24 NOTE — Telephone Encounter (Signed)
Patient called in saying she is still in pain after injections . Wants to be prescribed something for pain.  Wants medication to be sent to [facility] on [address]

## 2020-08-24 NOTE — Telephone Encounter (Signed)
Sent in tramadol

## 2020-08-27 ENCOUNTER — Other Ambulatory Visit: Payer: Self-pay | Admitting: Family

## 2020-08-29 ENCOUNTER — Other Ambulatory Visit: Payer: Self-pay | Admitting: Family Medicine

## 2020-08-29 DIAGNOSIS — I10 Essential (primary) hypertension: Secondary | ICD-10-CM

## 2020-09-08 ENCOUNTER — Other Ambulatory Visit: Payer: Self-pay

## 2020-09-08 ENCOUNTER — Encounter: Payer: Self-pay | Admitting: Physical Therapy

## 2020-09-08 ENCOUNTER — Ambulatory Visit (INDEPENDENT_AMBULATORY_CARE_PROVIDER_SITE_OTHER): Payer: Medicare HMO | Admitting: Physical Therapy

## 2020-09-08 DIAGNOSIS — M25511 Pain in right shoulder: Secondary | ICD-10-CM | POA: Diagnosis not present

## 2020-09-08 DIAGNOSIS — M6281 Muscle weakness (generalized): Secondary | ICD-10-CM | POA: Diagnosis not present

## 2020-09-08 DIAGNOSIS — R600 Localized edema: Secondary | ICD-10-CM | POA: Diagnosis not present

## 2020-09-08 DIAGNOSIS — M25611 Stiffness of right shoulder, not elsewhere classified: Secondary | ICD-10-CM | POA: Diagnosis not present

## 2020-09-08 NOTE — Therapy (Signed)
[facility] [address] [place], [place], [id number] Phone: [phone]   Fax:  [phone]  Physical Therapy Evaluation  Patient Details  Name: [name] MRN: [id number] Date of Birth: 10/04/1946 Referring Provider (PT): Tarry KosXu, Naiping M, MD   Encounter Date: 09/08/2020   PT End of Session - 09/08/20 1134    Visit Number 1    Number of Visits 18    Date for PT Re-Evaluation 11/03/20   POC 11/03/20   PT Start Time 1100    PT Stop Time 1135    PT Time Calculation (min) 35 min    Activity Tolerance Patient tolerated treatment well    Behavior [facility] for tasks assessed/performed           Past Medical History:  Diagnosis Date  . Blockage of coronary artery of heart (HCC)   . Depression   . Diabetes mellitus   . Hyperlipidemia   . Hypertension   . Pancreatitis   . Restless leg syndrome     Past Surgical History:  Procedure Laterality Date  . CATARACT EXTRACTION, BILATERAL    . CORONARY ANGIOPLASTY WITH STENT PLACEMENT    . FOOT SURGERY Right    cyst removal  . ROTATOR CUFF REPAIR     right  . ROTATOR CUFF REPAIR Left   . TUBAL LIGATION      There were no vitals filed for this visit.    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  Since then she has had decreased strength and range of motion limited by pain.  Denies any numbness and tingling. Had injection 08/18/20, she says no improvment since injection    Diagnostic tests XR "Mild degenerative changes [facility]en[facility]um[facility].  No acute abnormalities.    Patient Stated Goals improve her shoulder    Currently in Pain? Yes    Pain Score 5     Pain Location Shoulder    Pain Orientation Right    Pain Descriptors / Indicators Aching;Sharp;Dull    Pain Type Acute pain    Pain Radiating Towards denies N/T,  pain goes up to her neck    Pain Onset More than a month ago    Pain Frequency Intermittent    Aggravating Factors  using her arm to go up, out, behind her back    Pain Relieving Factors rest, tyelenol    Multiple Pain Sites No              [facility] PT Assessment - 09/08/20 0001      Assessment   Medical Diagnosis Right rotator cuff syndrome, partial tear    Referring Provider (PT) Tarry KosXu, Naiping M, MD    Onset Date/Surgical Date 08/01/20   date of injury   Hand Dominance Right    Next MD Visit nothing scheduled    Prior Therapy none      Restrictions   Other Position/Activity Restrictions no lifting, she is placed out of work by MD      Balance Screen   Has the patient fallen in the past 6 months No    Has the patient had a decrease in activity level because of a fear of falling?  No    Is the patient reluctant to leave their home because of a fear of falling?  No      Home Tourist information centre manager residence      Prior Function   Level of Independence Independent    Vocation Part time employment    Vocation Requirements CNA    Leisure nothing to report      Cognition   Overall Cognitive Status Within Functional Limits for tasks assessed      Observation/Other Assessments   Focus on Therapeutic Outcomes (FOTO)  36% functional intake      ROM / Strength   AROM / PROM / Strength AROM;PROM;Strength      AROM   AROM Assessment Site Shoulder    Right/Left Shoulder Right    Right Shoulder Flexion 100 Degrees    Right Shoulder ABduction 90 Degrees    Right Shoulder Internal Rotation --   Lt PSIS behind back   Right Shoulder External Rotation --   occiput behind head     PROM   PROM Assessment Site Shoulder    Right/Left Shoulder Left    Left Shoulder Flexion 130 Degrees    Left Shoulder ABduction 120 Degrees    Left Shoulder Internal Rotation 55 Degrees    Left Shoulder External Rotation 50 Degrees      Strength   Overall Strength Comments Grip  strength 4/5 on Rt    Strength Assessment Site Shoulder;Elbow    Right/Left Shoulder Right    Right Shoulder Flexion 3-/5    Right Shoulder ABduction 3-/5    Right Shoulder Internal Rotation 4/5    Right Shoulder External Rotation 3+/5    Right/Left Elbow Right    Right Elbow Flexion 4/5    Right Elbow Extension 4/5      Palpation   Palpation comment TTP in Rt parascapular muscles, traps, RTC insertion      Special Tests   Other special tests + speeds test, + crank test, + impingmeent tests                      Objective measurements completed on examination: See above findings.       [facility] Adult PT Treatment/Exercise - 09/08/20 0001      Modalities   Modalities Electrical Stimulation;Vasopneumatic      Programme researcher, broadcasting/film/video Location Rt shoulder    Electrical Stimulation Action IFC    Electrical Stimulation Parameters tolerance 10 min with vaso    Electrical Stimulation Goals Pain      Vasopneumatic   Number Minutes Vasopneumatic  10 minutes    Vasopnuematic Location  Shoulder    Vasopneumatic Pressure Medium    Vasopneumatic Temperature  34      Manual Therapy   Manual therapy comments Rt shoulder PROM and GH mobs grade 1 to tolerance                  PT Education - 09/08/20 1125    Education Details HEP, POC, TENS,    Person(s) Educated Patient    Methods Explanation;Demonstration;Verbal cues;Handout    Comprehension Verbalized understanding;Returned demonstration;Tactile cues required;Need further instruction            PT Short Term Goals - 09/08/20 1141      PT SHORT TERM GOAL #1   Title Pt will be I and compliant with HEP.    Time 4    Period Weeks    Status New    Target Date 10/06/20      PT SHORT TERM GOAL #2  Title Pt will report 25% improvment in Rt shoulder.    Time 4    Period Weeks    Status New             PT Long Term Goals - 09/08/20 1141      PT LONG TERM GOAL #1   Title Pt  will report >50% improvment in Rt shoulder.    Time 8    Period Weeks    Status New    Target Date 11/03/20      PT LONG TERM GOAL #2   Title Pt will improve FOTO to at least 59% functional.    Time 8    Period Weeks    Status New      PT LONG TERM GOAL #3   Title Pt will improve Rt shoulder AROM to [facility].    Time 8    Period Weeks    Status New      PT LONG TERM GOAL #4   Title Pt will improve Rt shoulder strength to at least 4+    Time 8    Period Weeks                  Plan - 09/08/20 1136    Clinical Impression Statement Pt was referred to PT for possible RTC tear from injury at work on 08/01/20. 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 She may be developing frozen shoulder as well due to her limited PROM and GH mobility although difficult to fully assess due to guarding. She will benefit from skilled PT to address her deficits in Rt shoulder strength, ROM, functional reaching, and to decrease overall pain.    Examination-Activity Limitations Caring for Others;Carry;Lift;Sleep;Reach Overhead    Examination-Participation Restrictions Cleaning;Driving;Laundry;Occupation    Stability/Clinical Decision Making Stable/Uncomplicated    Clinical Decision Making Low    Rehab Potential Good    PT Frequency 2x / week   2-3   PT Duration 8 weeks    PT Treatment/Interventions ADLs/Self Care Home Management;Cryotherapy;Electrical Stimulation;Iontophoresis 4mg /ml Dexamethasone;Moist Heat;Ultrasound;Gait training;Traction;Therapeutic activities;Therapeutic exercise;Neuromuscular re-education;Manual techniques;Passive range of motion;Dry needling;Joint Manipulations;Spinal Manipulations;Taping;Vasopneumatic Device    PT Next Visit Plan review and update HEP PRN, needs Rt shoulder ROM and RTC strength. Modalaties and manual PRN    PT Home Exercise Plan Access Code:    Consulted and Agree with Plan of Care Patient            Patient will benefit from skilled therapeutic intervention in order to improve the following deficits and impairments:  Decreased activity tolerance, Decreased mobility, Decreased range of motion, Decreased strength, Increased edema, Hypomobility, Increased fascial restricitons, Increased muscle spasms, Impaired UE functional use, Postural dysfunction, Pain  Visit Diagnosis: Acute pain of right shoulder  Stiffness of right shoulder, not elsewhere classified  Muscle weakness (generalized)  Localized edema     Problem List Patient Active Problem List   Diagnosis Date Noted  . Acute pain of right shoulder 08/18/2020  . Primary osteoarthritis of right knee 08/18/2020  . Impacted cerumen of right ear 05/24/2020  . Bilateral impacted cerumen 05/17/2020  . Hyperlipidemia associated with type 2 diabetes mellitus (HCC) 03/10/2020  . Cough 03/10/2020  . Seasonal allergies 03/10/2020  . Elevated rheumatoid factor 07/21/2018  . Gastroenteritis 07/21/2018  . Trigger middle finger of left hand 12/19/2017  . Ganglion cyst of dorsum of right wrist 12/19/2017  . Morton's neuroma of right foot 12/19/2017  .  Preventative health care 12/13/2017  . Pain in both hands 12/13/2017  . Hyperlipidemia LDL goal <100 12/13/2017  . Abscess of upper gum 12/13/2017  . Spinal stenosis of cervical region 09/25/2016  . Cervical radiculopathy 09/06/2016  . Dyspnea 02/18/2016  . Chest pain 02/18/2016  . HLD (hyperlipidemia) 02/18/2016  . CAD in native artery 02/18/2016  . RLS (restless legs syndrome) 02/18/2016  . Pancreatitis 02/18/2016  . SOB (shortness of breath) 02/18/2016  . Uncontrolled type 2 diabetes mellitus with complication (HCC)   . Tobacco abuse   . Osteoarthritis of right knee 07/05/2015  . Smoking 07/02/2013  . Acute upper respiratory infections of unspecified site 07/02/2013  . Flu vaccine need 07/02/2013  . Multinodular goiter 04/16/2013  . Bunion 07/07/2012  . BACK PAIN, THORACIC  REGION, RIGHT 05/24/2009  . IRON DEFICIENCY 03/25/2009  . GERD 03/25/2009  . WEIGHT LOSS 03/25/2009  . SINUSITIS- ACUTE-NOS 11/25/2008  . Hyperlipidemia LDL goal <70 10/07/2008  . ACUTE PANCREATITIS 09/07/2008  . OVARIAN CYST 09/07/2008  . KNEE PAIN, RIGHT 05/24/2008  . CARPAL TUNNEL SYNDROME, RIGHT 05/12/2008  . Acute pain of both shoulders 04/02/2008  . ROTATOR CUFF REPAIR, RIGHT, HX OF 03/29/2008  . GOITER, MULTINODULAR 12/26/2007  . Depression 07/07/2007  . HIP PAIN, RIGHT 03/11/2007  . RESTLESS LEG SYNDROME, HX OF 03/11/2007  . Diabetes mellitus type II, uncontrolled (HCC) 03/07/2007  . Essential hypertension 03/07/2007    April Manson, PT,DPT 09/08/2020, 11:45 AM  [facility] [address] [place], [place], [address] Phone: [phone]   Fax:  [phone]  Name: [name] MRN: [id number] Date of Birth: 08-Jul-1946

## 2020-09-08 NOTE — Patient Instructions (Signed)
Access Code: [email or web address] URL: [email or web address] Date: 09/08/2020 Prepared by: Ivery Quale  Exercises Supine Shoulder Flexion Extension AAROM with Dowel - 2 x daily - 6 x weekly - 1-2 sets - 10 reps Supine Shoulder External Rotation in 45 Degrees Abduction AAROM with Dowel - 2 x daily - 6 x weekly - 1-2 sets - 10 reps Supine Shoulder Abduction AAROM with Dowel - 2 x daily - 6 x weekly - 1-2 sets - 10 reps Sidelying Shoulder External Rotation - 2 x daily - 6 x weekly - 1-2 sets - 10 reps Sidelying Shoulder Abduction Palm Forward - 2 x daily - 6 x weekly - 1-2 sets - 10 reps

## 2020-09-10 ENCOUNTER — Other Ambulatory Visit: Payer: Self-pay | Admitting: Family Medicine

## 2020-09-13 ENCOUNTER — Ambulatory Visit: Payer: Medicare HMO | Admitting: Pharmacist

## 2020-09-13 DIAGNOSIS — E1165 Type 2 diabetes mellitus with hyperglycemia: Secondary | ICD-10-CM

## 2020-09-13 DIAGNOSIS — E1169 Type 2 diabetes mellitus with other specified complication: Secondary | ICD-10-CM

## 2020-09-13 DIAGNOSIS — I10 Essential (primary) hypertension: Secondary | ICD-10-CM

## 2020-09-13 NOTE — Chronic Care Management (AMB) (Signed)
Chronic Care Management Pharmacy  Name: [name]  MRN: [id number] DOB: 26-Nov-1945  Chief Complaint/ HPI  [name],  74 y.o. , female presents for their Follow-Up CCM visit with the clinical pharmacist via telephone due to COVID-19 Pandemic.  PCP : Ann Held, DO  Their chronic conditions include: Hypertension, Hyperlipidemia/Hx of CAD, Diabetes, Tobacco Use Disorder, Depression/Anxiety, Osteoporosis, Neuropathy  Office Visits: 08/23/20: Visit w/ Mackie Pai, PA-C - Allergic Rhinitis: "Will prescribe Xyzal antihistamine, Flonase nasal spray and making Hycodan available based on described severity.  Also notes that typically if severe cough will resolve illness from cough suppressant type med given.  Rx advisement given on Hycodan." Azithromycin given in case symptoms worsen. Follow up in 7 days or as needed.   Consult Visit: 09/08/20: Physical Therapy with Dr. Meda Coffee - Acute Pain or Right Shoulder: Visit number one. Pt tolerated treatment well   08/18/20: Ortho visit w/ Dr. Erlinda Hong - Injection for shoulder and recommend 6 weeks of PT. Due to uncontrolled DM, would wait to give injection in knee  08/15/20: Visit w/ Debbrah Alar, NP - Refer to ortho for knee and shoulder pain. Meloxicam for low back pain.  Medications: Outpatient Encounter Medications as of 09/13/2020  Medication Sig  . Alcohol Swabs (B-D SINGLE USE SWABS REGULAR) PADS USE AS DIRECTED  . alendronate (FOSAMAX) 70 MG tablet Take with a full glass of water on an empty stomach.  . ALPRAZolam (XANAX) 0.25 MG tablet Take 1 tablet by mouth three times a Desirre Eickhoff if needed  . amLODipine (NORVASC) 10 MG tablet Take 1 tablet (10 mg total) by mouth daily.  [name] aspirin EC 81 MG tablet Take 81 mg by mouth daily.  [name] azithromycin (ZITHROMAX) 250 MG tablet Take 2 tablets by mouth on Fausto Sampedro 1, followed by 1 tablet by mouth daily for 4 days.  . Blood Glucose Calibration (TAI DOC CONTROL) NORMAL SOLN   . Blood Glucose  Monitoring Suppl (TRUE METRIX AIR GLUCOSE METER) w/Device KIT 1 each by Does not apply route 4 (four) times daily. DX CODE E11.8  . Carboxymethylcellulose Sodium (THERATEARS OP) Place 1 drop into both eyes daily as needed (dry eyes).  . fenofibrate 160 MG tablet Take 1 tablet (160 mg total) by mouth daily.  [name] FERRETTS 325 (106 Fe) MG TABS tablet Take 1 tablet by mouth daily.   . ferrous sulfate 325 (65 FE) MG tablet Take 325 mg by mouth daily with breakfast.   . Flaxseed, Linseed, (FLAXSEED OIL PO) Take 1 capsule by mouth 2 (two) times daily.   [name] FLUoxetine (PROZAC) 20 MG capsule TAKE 1 CAPSULE EVERY Alayza Pieper  . fluticasone (FLONASE) 50 MCG/ACT nasal spray SHAKE LIQUID AND USE 2 SPRAYS IN EACH NOSTRIL DAILY  . glucose blood (TRUE METRIX BLOOD GLUCOSE TEST) test strip USE AS DIRECTED 4 TIMES A Finis Hendricksen  . HYDROcodone-homatropine (HYCODAN) 5-1.5 MG/5ML syrup Take 5 mLs by mouth every 6 (six) hours as needed.  . insulin regular (NOVOLIN R RELION) 100 units/mL injection Inject 0.3 mLs (30 Units total) into the skin daily as needed for high blood sugar (at noon if CBG >200 and will be eating). (Patient taking differently: Inject 15 Units into the skin 2 (two) times daily before a meal. )  . Insulin Syringe-Needle U-100 (BD INSULIN SYRINGE U/F) 31G X 5/16" 0.3 ML MISC 10 Units by Does not apply route 3 (three) times daily before meals.  Elmore Guise Devices (LANCING DEVICE) MISC   . levocetirizine (XYZAL) 5  MG tablet TAKE 1 TABLET(5 MG) BY MOUTH EVERY EVENING  . lisinopril (ZESTRIL) 40 MG tablet Take 1 tablet (40 mg total) by mouth daily.  . meloxicam (MOBIC) 7.5 MG tablet Take 1 tablet (7.5 mg total) by mouth daily as needed for pain.  . metoprolol succinate (TOPROL-XL) 50 MG 24 hr tablet TAKE 1 TABLET(50 MG) BY MOUTH DAILY WITH FOOD  . Multiple Vitamin (MULTIVITAMIN WITH MINERALS) TABS Take 1 tablet by mouth daily.  [name] neomycin-polymyxin-hydrocortisone (CORTISPORIN) 3.5-10000-1 OTIC suspension Place 4 drops into the  left ear 3 (three) times daily as needed.  [name] NOVOLIN N RELION 100 UNIT/ML injection Inject 30-60 Units into the skin 2 (two) times daily. 50 units before breakfast and 50 units before bedtime  . Omega-3 Fatty Acids (FISH OIL) 1000 MG CAPS Take by mouth.  . rosuvastatin (CRESTOR) 20 MG tablet TAKE 1 TABLET(20 MG) BY MOUTH DAILY  . traMADol (ULTRAM) 50 MG tablet Take 1 tablet (50 mg total) by mouth 3 (three) times daily as needed.  . TRUEplus Lancets 33G MISC USE AS DIRECTED 4 TIMES A Layne Dilauro  . vitamin E 400 UNIT capsule Take 400 Units by mouth daily.   No facility-administered encounter medications on file as of 09/13/2020.   SDOH Screenings   Alcohol Screen: Not on file  Depression (PHQ2-9): Medium Risk  . PHQ-2 Score: 16  Financial Resource Strain: Medium Risk  . Difficulty of Paying Living Expenses: Somewhat hard  Food Insecurity: No Food Insecurity  . Worried About Charity fundraiser in the Last Year: Never true  . Ran Out of Food in the Last Year: Never true  Housing: Low Risk   . Last Housing Risk Score: 0  Physical Activity: Not on file  Social Connections: Not on file  Stress: Not on file  Tobacco Use: Medium Risk  . Smoking Tobacco Use: Former Smoker  . Smokeless Tobacco Use: Never Used  Transportation Needs: No Transportation Needs  . Lack of Transportation (Medical): No  . Lack of Transportation (Non-Medical): No    Current Diagnosis/Assessment:  Goals Addressed            This Visit's Progress   . Chronic Care Management Pharmacy Care Plan       CARE PLAN ENTRY (see longitudinal plan of care for additional care plan information)  Current Barriers:  . Chronic Disease Management support, education, and care coordination needs related to Hypertension, Hyperlipidemia/Hx of CAD, Diabetes, Tobacco Use Disorder, Depression/Anxiety, Osteoporosis, Neuropathy   Hypertension BP Readings from Last 3 Encounters:  08/23/20 (!) 153/83  08/15/20 140/61  05/24/20 (!) 129/50    . Pharmacist Clinical Goal(s): o Over the next 90 days, patient will work with PharmD and providers to achieve BP goal <130/80 . Current regimen:  . Amlodipine 27m daily . Lisinopril 472mdaily . Metoprolol Succinate 5072maily . Interventions: o Requested patient to check her blood pressure 2-3 times per week and record . Patient self care activities - Over the next 90 days, patient will: o Check BP 2-3 times per week, document, and provide at future appointments o Ensure daily salt intake < 2300 mg/Orie Cuttino  Hyperlipidemia Lab Results  Component Value Date/Time   LDLCALC 61 06/15/2020 08:19 AM   LDLDIRECT 100.0 03/10/2020 02:10 PM   . Pharmacist Clinical Goal(s): o Over the next 90 days, patient will work with PharmD and providers to maintain LDL goal < 70 . Current regimen:  . Aspirin 68m44mily . Fenofibrate 160mg35mly . Flaxseed Oil 1  capsule twice daily  . Omega 3 fish Oil 1026m twice daily . Rosuvastatin 261mdaily . Interventions: o Discussed limited efficacy of taking fish oil and flaxseed oil. Will continue this discussion o Discussed that patient should be taking rosuvastatin vs atorvastatin (This was changed June 2021) . Patient self care activities - Over the next 90 days, patient will: o A low fat, low cholesterol is discussed with the patient, and a written copy is given to her.   Diabetes Lab Results  Component Value Date/Time   HGBA1C 10.0 (H) 06/15/2020 08:19 AM   HGBA1C 9.4 (H) 03/10/2020 02:10 PM   . Pharmacist Clinical Goal(s): o Over the next 180 days, patient will work with PharmD and providers to achieve A1c goal <7% . Current regimen:  . Relion Novolin R 30 units daily as needed for high blood sugar (at noon if BG >200 and will be eating ) . Relion Novolin N 30-60 units twice daily Interventions: o Discussed DM goals - Fasting (before meal blood sugar): 80-130 - Post-Prandial (2 hrs after eating blood sugar): less than 180 - A1c (3 month blood  sugar average): less than 7% o Requested patient to check BG twice daily o Consider consistently using Relion Novolin N 50 units twice daily  o Consider consistently using Relion Novolin R 15 units twice daily before meals o Collaboration with provider regarding medication management (transition to basal/bolus regimen) o Complete patient assistance program application for basal/bolus regimen . Patient self care activities - Over the next 90 days, patient will: o Check blood sugar twice daily, document, and provide at future appointments o Contact provider with any episodes of hypoglycemia o Consistently take Novolin N 50 units twice daily o Consistently take Novolon R 15 units before meals (twice daily)  Depression . Pharmacist Clinical Goal(s) o Over the next 90 days, patient will work with PharmD and providers to reduce symptoms associated with depression . Current regimen:  . Alprazolam 0.254mhree times daily as needed  . Fluoxetine 69m24mily . Interventions: o Collaboration with provider regarding medication management (consider increase of fluoxetine to 40mg17mly) . Patient self care activities - Over the next 90 days, patient will: o Maintain depression medication regimen  Osteoporosis . Pharmacist Clinical Goal(s) o Over the next 180 days, patient will work with PharmD and providers to reduce risk of fracture due to osteoporosis  . Current regimen:  o Alendronate 70mg 47mly . Interventions: o Consider repeat DEXA Scan . Patient self care activities - Over the next 180 days, patient will: o Consider repeat DEXA Scan  Medication management . Pharmacist Clinical Goal(s): o Over the next 90 days, patient will work with PharmD and providers to maintain optimal medication adherence . Current pharmacy: [facility] switched to [facility] . Interventions o Comprehensive medication review performed. o Utilize [facility] pharmacy for medication synchronization, packaging and  delivery . Patient self care activities - Over the next 90 days, patient will: o Focus on medication adherence by filling and taking medications appropriately  o Take medications as prescribed o Report any questions or concerns to PharmD and/or provider(s)  Please see past updates related to this goal by clicking on the "Past Updates" button in the selected goal       Social Hx:  Married to Henry [employer] a CCM patient).  Has 5 children and over 40 grand, step-grand, and great grandchildren.  Her children are very important to her and motivate her to get things done. She loves cooking for them.  Her newest great grandchild was born on 06-Jun-2020 Her mother passed away in 12/11/18 and patient was made executor of her mother's will. This has caused a lot of turmoil among family members.   Update 09/13/20 Went to the beach for Thanksgiving with her family.  Hypertension   BP goal is:  <130/80 (per Cardio)  Office blood pressures are  BP Readings from Last 3 Encounters:  08/23/20 (!) 153/83  08/15/20 140/61  05/24/20 (!) 129/50   Patient checks BP at home infrequently (last time was 2-3 weeks ago) Patient home BP readings are ranging: Unable to assess  Patient has failed these meds in the past: None noted  Patient is currently uncontrolled on the following medications:  . Amlodipine 66m daily ([facility] . Lisinopril 450mdaily (last filled 07/29/20 35 DS at [facility]) . Metoprolol Succinate 5016maily  Patient Reported Adherence States she is usually good about taking these. Will miss about once per week  We discussed BP goal   Update 09/13/20 Checking BP? No, "so much going on in my life" Low battery on meter Need Lisinopril? Yes in 10 days Denies headaches, chest pains, or dizziness  She mentions she would like to get medication packaged, but she got confused.   Qty Remaining Lisinopril - #10 Metoprolol - #10 Amlodipine -  #118  Pattern of non-adherence although patient states mail order over supplies her with medication.  Plan -Needs refill of lisinopril and metoprolol in 10 days. Will coordinate -Check blood pressure 2-3 times per week and record -Continue current medications     Diabetes   Follows with Dr. BalChalmers Caterndo) about every 4 months Next appt on 07/29/20  A1c goal <7%  FBG goal: 80-130 PPBG goal: <180  Recent Relevant Labs: Lab Results  Component Value Date/Time   HGBA1C 10.0 (H) 06/15/2020 08:19 AM   HGBA1C 9.4 (H) 03/10/2020 02:10 PM   GFR 53.11 (L) 03/10/2020 02:10 PM   GFR 72.29 07/28/2019 02:50 PM   MICROALBUR 27.3 (H) 07/28/2019 02:50 PM   MICROALBUR 1.7 06/23/2015 10:21 AM    Last diabetic Eye exam: (noted April 2021) Lab Results  Component Value Date/Time   HMDIABEYEEXA Cataracts 07/08/2013 12:00 AM    Last diabetic Foot exam: No results found for: HMDIABFOOTEX (noted on 03/10/20 visit)  Checking BG: 5-6 times per week  Recent FBG Readings: Unable to assess Recent 2hr PP BG readings:  Unable to assess Recent HS BG readings: 325 last night (reports she had a busy Zayleigh Stroh and missed all of her medicine yesterday)  Patient has failed these meds in past: None noted  Patient is currently uncontrolled on the following medications: . Relion Novolin R 30 units daily as needed for high blood sugar (at noon if BG >200 and will be eating ) . Relion Novolin N 50 units before breakfast and 50 units before bedtime (prescribed as 30-60 units BID) . Jardiance 1m82mily   What happened to Lantus? Doesn't recall taking Humalog? Would get in the donut whole in August, so she would stop taking it.  Wakes up in the morning with low blood sugar taking Novolin N as prescribed, so she takes 50 units at each dose.  Does not use R daily. Shes 15-20 units of R. Might only give 15 units at night. She  "I am really afraid of the R" Reports one time her BG was down to 29 when taking 25 units. States  she is most comfortable with a  dose of Novolin R 15 units per dose.  Gets N and R O[facility] for $24.88 [facility]ial [facility]  Patient reported Adherence Novolin N - 4/7 days takes both doses Novolin R - 5/7 days takes at least one dose  Reports she takes R at bedtime. Discussed that R should be taken with meals and N should be taken at bedtime. This could be a contributing factor to her nocturnal hypoglycemia. Last hypoglycemic episode was about 3 weeks ago. Woke up in the middle of the night.   We discussed: DM goals (a1c <7%, FBG 80-130, PPBG <180) , We discussed that Jardiance would have to be prescribed by Dr. Chalmers Cater, but that we would work on transitioning her insulin regimen before adding another oral medication.  Diet Eats twice daily B - eggs, sausage, sometimes grits, ham L - skips, but if she does eat mid [place] it is a usually a burger ([employer], [employer], [place]) D - Meat and a green vegetable and a starch most days Snacks - Bag of chips about twice a week Drinks - Lately drinking a lot of water, or diet pepsi or coke, occassionally juice and regular soda  Exercise Nothing outside of running errands. Planned to start a walking club, but her knees give her pain  Update 09/13/20 Qty Remaining  Jardiance - #31 189 last night before dinner Tries to check at least once a Tomoki Lucken Highest number last week 329 Lowest number last week 189 BG has not gone done much since cortisone injection. Feels the injection caused yeast infection. Would like script for fluconazole** R = 15 units AM and 15 units PM (rarely uses 20 units); does twice daily about 5 days a week  N = 50 units BID  Insulin conversion guidance NPH or long-acting to ultra-long acting Tyler Aas) Convert total daily dose unit-per-unit and give once daily,19 or consider a 20% dose reduction. Do not increase the dose more often than every 3 to 4 days.  NPH 100 units daily = Tresiba 100 units daily 20% dose reduction =  Tresiba 80 units daily (due to concern for possible non-adherence)  Regular to Rapid-Acting (Novolog) Convert unit-per-unit. Rapid-acting insulin analogs have a faster onset of action and a shorter duration of action than human regular insulin.  R 15 units twice daily = Novolog 15 units twice daily  Plan -Complete PAP paperwork -Check blood sugar fasting each morning and 2 hour post prandial -Consistently take Novolin N 50 units twice daily before breakfast and before bedtime -Consistently take Novolin R 15 units before breakfast and before dinner -Plan to transition to a basal/bolus regimen for more predictable pharmacodynamics of a long-acting and short-acting insulin vs Novolin N and Novolin R. Tyler Aas 80 units once daily plus Novolog 15 units twice daily pending Dr. Chalmers Cater approval)   Future -Complete a patient assistance program for basal/bolus regimen Tyler Aas and Novolog) -Will coordinate with Dr. Almetta Lovely office to get patient transitioned appropriately  Hyperlipidemia/Hx of CAD   LDL goal <70  Lipid Panel     Component Value Date/Time   CHOL 131 06/15/2020 0819   TRIG 344 (H) 06/15/2020 0819   HDL 29 (L) 06/15/2020 0819   LDLCALC 61 06/15/2020 0819   LDLDIRECT 100.0 03/10/2020 1410    Hepatic Function Latest Ref Rng & Units 06/15/2020 03/10/2020 07/28/2019  Total Protein 6.1 - 8.1 g/dL 6.9 6.7 6.5  Albumin 3.5 - 5.2 g/dL - 4.4 4.3  AST 10 - 35 U/L _0 ALT 6 -  29 U/L _0 Alk Phosphatase 39 - 117 U/L - 39 62  Total Bilirubin 0.2 - 1.2 mg/dL 0.4 0.7 0.8  Bilirubin, Direct 0.0 - 0.3 mg/dL - - -     The 10-year ASCVD risk score Mikey Bussing DC Jr., et al., 2013) is: 23.8%   Values used to calculate the score:     Age: 74 years     Sex: Female     Is Non-Hispanic African American: Yes     Diabetic: Yes     Tobacco smoker: No     Systolic Blood Pressure: 786 mmHg     Is BP treated: Yes     HDL Cholesterol: 29 mg/dL     Total Cholesterol: 131 mg/dL   Patient has  failed these meds in past: atorvastatin (inefficacy?), lovaza (listed in D/C meds. No apparent reason for D/C) Patient is currently controlled for LDL, uncontrolled for TRIG and HDL on the following medications:  . Aspirin 45m daily . Fenofibrate 1624mdaily . Flaxseed Oil 100012m capsule twice daily  . Omega 3 fish Oil 1030m25mice daily . Rosuvastatin 20mg45mly   Patient states she got confused with atorvastatin and rosuvastatin.  She denies taking both atorvastatin and rosuvastatin at same time, but would alternate between the two. She states she has been taking the rosuvastatin and fenofibrate. Encouraged patient to discard atorvastatin and switch to rosuvastatin.   We discussed:  How flaxseed oil and omega 3 oil may not be helpful for patient. Noting her elevated lipid panel will reassess this upon next lipid panel with change to rosuvastatin   Update 09/13/20 LDL improved since change to rosuvastatin.  Qty Remaining Rosuvastatin - #10  Will need refill of rosuvastatin in 10 days  Plan -Continue current medication -Coordinate rosuvastatin refill   Tobacco Use Disorder   Tobacco Status:  Social History   Tobacco Use  Smoking Status Former Smoker  . Packs/Zillah Alexie: 0.20  . Types: Cigarettes  . Quit date: 05/05/2020  . Years since quitting: 0.3  Smokeless Tobacco Never Used    Patient smokes After 30 minutes of waking Patient triggers include: stress Is considered a former smoker, but will smoke a cigarette occasionally when stressed.  Previous quit attempts included: 2 (on most recent time this time. Previous time was 10 years ago and she stopped for 2-3 years, playing bingo reintroduced cigarettes in her life) Patient is currently controlled on the following medications:  . None  She stopped cold [place] 4 weeks ago. States she did smoke 1 cigarette last week.  She doesn't buy them anymore, but she got one from her son. 2 of her sons smoke. Will use food,  watching a movie, or self control to replace urge to smoke. Has never used nicotine gum before.   We discussed:  Counseled on park & chew method for NRT gum. Discussed how if food, a movie, or a practicing self-control does not work she can try nicotine gum  Update 09/13/20 States she is still not smoking. She doesn't have the desire to smoke. Congratulated patient on this  Plan -Continue refraining from smoking   Depression/Anxiety   Dep[facility] 06/07/2020 05/12/2020  Decreased Interest 3 2 0  Down, Depressed, Hopeless 3 1 0  PHQ - 2 Score 6 3 0  Altered sleeping 1 1 -  Tired, decreased energy 3 1 -  Change in appetite 0 1 -  Feeling bad or failure about yourself  3 0 -  Trouble concentrating 0 1 -  Moving slowly or fidgety/restless 3 0 -  Suicidal thoughts 0 0 -  PHQ-9 Score 16 7 -  Difficult doing work/chores Very difficult Very difficult -  Some recent data might be hidden    Patient has failed these meds in past: sertraline (palpitations) Patient is currently uncontrolled on the following medications:  . Alprazolam 0.35m three times daily as needed (takes about 2-3 times per week or less) . Fluoxetine 237mdaily   Last week was a bad week for her and she took xanax twice. She is dealing with family disagreements as she is the executor of her mother's will.  2020 was a rough year for her. She lost her mother, nephew, brother in law, 3 friends, and 2 grandsons. Would like to give this more time before making change  We discussed:  Option of medication titration, but she would like to wait for now. Encouraged patient to reach out if further help is needed. She verbalizes understanding.  Update 09/13/20 Qty Remaining Fluoxetine ~90 She now feels she may benefit from updated dose of fluoxetine noting her PHQ 9 of 16 today.  Will coordinate with PCP to increase dose if agreeable.   Plan -Consider increase fluoxetine to 4075maily pending Dr.  LowEtter Sjogrenproval  Osteoporosis   Last DEXA Scan: 05/29/2017  T-Score femoral neck: -2.0  T-Score forearm radius: -2.5  No results found for: VD25OH   Patient has failed these meds in past: None noted  Patient is currently controlled on the following medications:  . Alendronate 42m42mekly (Sunday)  Update 09/13/20 Admits that she forgets to take this medication. States she has plenty of this medication; #16 remaining  Plan -Complete DEXA Scan -Continue current medications   Medication Management   [facility] Uses pill box? Yes Pt endorses 80% compliance  Problem Story She feels mail order pharmacy sends her more medication than she needs, so she would like to stop using mail order.  Discussed how [facility] could help coordinate her medications noting her husband is also receiving his medications from [facility] and she would like to transition her medications to [facility]  Update 09/13/20 Admits she forgets her HS meds (rosuvastatin, fenofibrate, fish oil, flaxseed) Created sync plan with the listed Kayveon Lennartz supplies remaining of each medication   Follow up:  1 month phone visit  Miscellaneous Meds Benzonatate 100mg50m about twice a daily Theratears Fluticasone 50mcg44mtivitamin Cortisporin Vitamin E 400 units daily (Was told this would help with her breast pain and it has been helpful for her. Discussed risk/benefit and feel this specific patient has greater benefit than risk noting it is on the lower end (<1000 units/Jillianna Stanek))  Meds to D/C from list Ferrous Sulfate 325mg  79mshaDe BlanchD, BCACP Clinical Pharmacist [phone]

## 2020-09-15 ENCOUNTER — Encounter: Payer: Self-pay | Admitting: *Deleted

## 2020-09-15 DIAGNOSIS — Z006 Encounter for examination for normal comparison and control in clinical research program: Secondary | ICD-10-CM

## 2020-09-15 NOTE — Research (Signed)
I spoke to patient for 8-month Coordinate-Diabetes Study. Patient sees Dr. Talmage Nap every 4 months for diabetes management. Patient has not been hospitalized. I assessed patient medications. I will call patient back in 3 months.

## 2020-09-16 NOTE — Patient Instructions (Addendum)
Visit Information  Goals Addressed            This Visit's Progress   . Chronic Care Management Pharmacy Care Plan       CARE PLAN ENTRY (see longitudinal plan of care for additional care plan information)  Current Barriers:  . Chronic Disease Management support, education, and care coordination needs related to Hypertension, Hyperlipidemia/Hx of CAD, Diabetes, Tobacco Use Disorder, Depression/Anxiety, Osteoporosis, Neuropathy   Hypertension BP Readings from Last 3 Encounters:  08/23/20 (!) 153/83  08/15/20 140/61  05/24/20 (!) 129/50   . Pharmacist Clinical Goal(s): o Over the next 90 days, patient will work with PharmD and providers to achieve BP goal <130/80 . Current regimen:  . Amlodipine 10mg  daily . Lisinopril 40mg  daily . Metoprolol Succinate 50mg  daily . Interventions: o Requested patient to check her blood pressure 2-3 times per week and record . Patient self care activities - Over the next 90 days, patient will: o Check BP 2-3 times per week, document, and provide at future appointments o Ensure daily salt intake < 2300 mg/Chanele Douglas  Hyperlipidemia Lab Results  Component Value Date/Time   LDLCALC 61 06/15/2020 08:19 AM   LDLDIRECT 100.0 03/10/2020 02:10 PM   . Pharmacist Clinical Goal(s): o Over the next 90 days, patient will work with PharmD and providers to maintain LDL goal < 70 . Current regimen:  . Aspirin 81mg  daily . Fenofibrate 160mg  daily . Flaxseed Oil 1 capsule twice daily  . Omega 3 fish Oil 1030mg  twice daily . Rosuvastatin 20mg  daily . Interventions: o Discussed limited efficacy of taking fish oil and flaxseed oil. Will continue this discussion o Discussed that patient should be taking rosuvastatin vs atorvastatin (This was changed June 2021) . Patient self care activities - Over the next 90 days, patient will: o A low fat, low cholesterol is discussed with the patient, and a written copy is given to her.   Diabetes Lab Results  Component  Value Date/Time   HGBA1C 10.0 (H) 06/15/2020 08:19 AM   HGBA1C 9.4 (H) 03/10/2020 02:10 PM   . Pharmacist Clinical Goal(s): o Over the next 180 days, patient will work with PharmD and providers to achieve A1c goal <7% . Current regimen:  . Relion Novolin R 30 units daily as needed for high blood sugar (at noon if BG >200 and will be eating ) . Relion Novolin N 30-60 units twice daily Interventions: o Discussed DM goals - Fasting (before meal blood sugar): 80-130 - Post-Prandial (2 hrs after eating blood sugar): less than 180 - A1c (3 month blood sugar average): less than 7% o Requested patient to check BG twice daily o Consider consistently using Relion Novolin N 50 units twice daily  o Consider consistently using Relion Novolin R 15 units twice daily before meals o Collaboration with provider regarding medication management (transition to basal/bolus regimen) o Complete patient assistance program application for basal/bolus regimen . Patient self care activities - Over the next 90 days, patient will: o Check blood sugar twice daily, document, and provide at future appointments o Contact provider with any episodes of hypoglycemia o Consistently take Novolin N 50 units twice daily o Consistently take Novolon R 15 units before meals (twice daily)  Depression . Pharmacist Clinical Goal(s) o Over the next 90 days, patient will work with PharmD and providers to reduce symptoms associated with depression . Current regimen:  . Alprazolam 0.25mg  three times daily as needed  . Fluoxetine 20mg  daily . Interventions: o Collaboration with provider  regarding medication management (consider increase of fluoxetine to 40mg  daily) . Patient self care activities - Over the next 90 days, patient will: o Maintain depression medication regimen  Osteoporosis . Pharmacist Clinical Goal(s) o Over the next 180 days, patient will work with PharmD and providers to reduce risk of fracture due to  osteoporosis  . Current regimen:  o Alendronate 70mg  weekly . Interventions: o Consider repeat DEXA Scan . Patient self care activities - Over the next 180 days, patient will: o Consider repeat DEXA Scan  Medication management . Pharmacist Clinical Goal(s): o Over the next 90 days, patient will work with PharmD and providers to maintain optimal medication adherence . Current pharmacy: [facility] switched to [facility] . Interventions o Comprehensive medication review performed. o Utilize [facility] pharmacy for medication synchronization, packaging and delivery . Patient self care activities - Over the next 90 days, patient will: o Focus on medication adherence by filling and taking medications appropriately  o Take medications as prescribed o Report any questions or concerns to PharmD and/or provider(s)  Please see past updates related to this goal by clicking on the "Past Updates" button in the selected goal         The patient verbalized understanding of instructions, educational materials, and care plan provided today and agreed to receive a mailed copy of patient instructions, educational materials, and care plan.   Telephone follow up appointment with pharmacy team member scheduled for: 10/14/2020  Joyel Chenette, [facility]

## 2020-09-19 ENCOUNTER — Other Ambulatory Visit: Payer: Self-pay

## 2020-09-19 MED ORDER — METOPROLOL SUCCINATE ER 50 MG PO TB24
ORAL_TABLET | ORAL | 1 refills | Status: DC
Start: 2020-09-19 — End: 2021-03-27

## 2020-09-20 ENCOUNTER — Ambulatory Visit (INDEPENDENT_AMBULATORY_CARE_PROVIDER_SITE_OTHER): Payer: Medicare HMO | Admitting: Physical Therapy

## 2020-09-20 ENCOUNTER — Other Ambulatory Visit: Payer: Self-pay

## 2020-09-20 ENCOUNTER — Encounter: Payer: Self-pay | Admitting: Physical Therapy

## 2020-09-20 DIAGNOSIS — M25611 Stiffness of right shoulder, not elsewhere classified: Secondary | ICD-10-CM

## 2020-09-20 DIAGNOSIS — R600 Localized edema: Secondary | ICD-10-CM

## 2020-09-20 DIAGNOSIS — M25511 Pain in right shoulder: Secondary | ICD-10-CM

## 2020-09-20 DIAGNOSIS — M6281 Muscle weakness (generalized): Secondary | ICD-10-CM

## 2020-09-20 NOTE — Therapy (Signed)
[facility] [address] [place], [place], [address] Phone: [phone]   Fax:  [phone]  Physical Therapy Treatment  Patient Details  Name: [name] MRN: [id number] Date of Birth: August 27, 1946 Referring Provider (PT): Tarry Kos, MD   Encounter Date: 09/20/2020   PT End of Session - 09/20/20 0947    Visit Number 2    Number of Visits 18    Date for PT Re-Evaluation 11/03/20    PT Start Time 0937    PT Stop Time 1022    PT Time Calculation (min) 45 min    Activity Tolerance Patient tolerated treatment well    Behavior During Therapy [facility] for tasks assessed/performed           Past Medical History:  Diagnosis Date  . Blockage of coronary artery of heart (HCC)   . Depression   . Diabetes mellitus   . Hyperlipidemia   . Hypertension   . Pancreatitis   . Restless leg syndrome     Past Surgical History:  Procedure Laterality Date  . CATARACT EXTRACTION, BILATERAL    . CORONARY ANGIOPLASTY WITH STENT PLACEMENT    . FOOT SURGERY Right    cyst removal  . ROTATOR CUFF REPAIR     right  . ROTATOR CUFF REPAIR Left   . TUBAL LIGATION      There were no vitals filed for this visit.   Subjective Assessment - 09/20/20 0944    Subjective Pt arriving reporting 4/10 pain in R shoulder. Pt reporting compliance with HEP at least once daily.    Diagnostic tests XR "Mild degenerative changes of the [facility] joint glenohumeral joint.  No acute abnormalities.    Patient Stated Goals improve her shoulder    Currently in Pain? Yes    Pain Score 4     Pain Location Shoulder    Pain Orientation Right    Pain Descriptors / Indicators Aching;Sore    Pain Type Acute pain    Pain Onset More than a month ago                             [facility] Adult PT Treatment/Exercise - 09/20/20 0001      Exercises   Exercises Shoulder      Shoulder Exercises: Supine   External Rotation AAROM;Right;15 reps    External Rotation  Limitations with cane    Flexion AAROM;Both;15 reps    Flexion Limitations with cane      Shoulder Exercises: Seated   Other Seated Exercises table slides: fleixon and ER x 10 holding 5 seconds each      Shoulder Exercises: Pulleys   Flexion 3 minutes      Shoulder Exercises: ROM/Strengthening   Ranger seated, circles clock wise and counter clockwise, flexion and scaption.      Modalities   Modalities Futures trader IFC    Electrical Stimulation Parameters 10    Electrical Stimulation Goals Pain      Vasopneumatic   Number Minutes Vasopneumatic  10 minutes    Vasopnuematic Location  Shoulder    Vasopneumatic Pressure Medium    Vasopneumatic Temperature  34      Manual Therapy   Manual therapy comments Rt shoulder PROM and GH mobs grade 1 to tolerance  PT Short Term Goals - 09/20/20 1002      PT SHORT TERM GOAL #1   Title Pt will be I and compliant with HEP.    Status On-going      PT SHORT TERM GOAL #2   Title Pt will report 25% improvment in Rt shoulder.    Status On-going             PT Long Term Goals - 09/08/20 1141      PT LONG TERM GOAL #1   Title Pt will report >50% improvment in Rt shoulder.    Time 8    Period Weeks    Status New    Target Date 11/03/20      PT LONG TERM GOAL #2   Title Pt will improve FOTO to at least 59% functional.    Time 8    Period Weeks    Status New      PT LONG TERM GOAL #3   Title Pt will improve Rt shoulder AROM to [facility].    Time 8    Period Weeks    Status New      PT LONG TERM GOAL #4   Title Pt will improve Rt shoulder strength to at least 4+    Time 8    Period Weeks                 Plan - 09/20/20 0959    Clinical Impression Statement Pt arriving today to therapy reporting 4/10 pain in R shoulder. Pt with increased time for activities due to increased  pain in R shoulder. E-stim and vaso performed at end of session with mild decrease in pain noted. Continue skilled PT.    Examination-Activity Limitations Caring for Others;Carry;Lift;Sleep;Reach Overhead    Examination-Participation Restrictions Cleaning;Driving;Laundry;Occupation    Stability/Clinical Decision Making Stable/Uncomplicated    Rehab Potential Good    PT Frequency 2x / week    PT Treatment/Interventions ADLs/Self Care Home Management;Cryotherapy;Electrical Stimulation;Iontophoresis 4mg /ml Dexamethasone;Moist Heat;Ultrasound;Gait training;Traction;Therapeutic activities;Therapeutic exercise;Neuromuscular re-education;Manual techniques;Passive range of motion;Dry needling;Joint Manipulations;Spinal Manipulations;Taping;Vasopneumatic Device    PT Next Visit Plan review and update HEP PRN, needs Rt shoulder ROM and RTC strength. Modalaties and manual PRN    PT Home Exercise Plan Access Code:    Consulted and Agree with Plan of Care Patient           Patient will benefit from skilled therapeutic intervention in order to improve the following deficits and impairments:  Decreased activity tolerance,Decreased mobility,Decreased range of motion,Decreased strength,Increased edema,Hypomobility,Increased fascial restricitons,Increased muscle spasms,Impaired UE functional use,Postural dysfunction,Pain  Visit Diagnosis: Localized edema  Acute pain of right shoulder  Stiffness of right shoulder, not elsewhere classified  Muscle weakness (generalized)     Problem List Patient Active Problem List   Diagnosis Date Noted  . Acute pain of right shoulder 08/18/2020  . Primary osteoarthritis of right knee 08/18/2020  . Impacted cerumen of right ear 05/24/2020  . Bilateral impacted cerumen 05/17/2020  . Hyperlipidemia associated with type 2 diabetes mellitus (HCC) 03/10/2020  . Cough 03/10/2020  . Seasonal allergies 03/10/2020  . Elevated rheumatoid factor 07/21/2018  .  Gastroenteritis 07/21/2018  . Trigger middle finger of left hand 12/19/2017  . Ganglion cyst of dorsum of right wrist 12/19/2017  . Morton's neuroma of right foot 12/19/2017  . Preventative health care 12/13/2017  . Pain in both hands 12/13/2017  . Hyperlipidemia LDL goal <100 12/13/2017  . Abscess of upper gum 12/13/2017  . Spinal stenosis  of cervical region 09/25/2016  . Cervical radiculopathy 09/06/2016  . Dyspnea 02/18/2016  . Chest pain 02/18/2016  . HLD (hyperlipidemia) 02/18/2016  . CAD in native artery 02/18/2016  . RLS (restless legs syndrome) 02/18/2016  . Pancreatitis 02/18/2016  . SOB (shortness of breath) 02/18/2016  . Uncontrolled type 2 diabetes mellitus with complication (HCC)   . Tobacco abuse   . Osteoarthritis of right knee 07/05/2015  . Smoking 07/02/2013  . Acute upper respiratory infections of unspecified site 07/02/2013  . Flu vaccine need 07/02/2013  . Multinodular goiter 04/16/2013  . Bunion 07/07/2012  . BACK PAIN, THORACIC REGION, RIGHT 05/24/2009  . IRON DEFICIENCY 03/25/2009  . GERD 03/25/2009  . WEIGHT LOSS 03/25/2009  . SINUSITIS- ACUTE-NOS 11/25/2008  . Hyperlipidemia LDL goal <70 10/07/2008  . ACUTE PANCREATITIS 09/07/2008  . OVARIAN CYST 09/07/2008  . KNEE PAIN, RIGHT 05/24/2008  . CARPAL TUNNEL SYNDROME, RIGHT 05/12/2008  . Acute pain of both shoulders 04/02/2008  . ROTATOR CUFF REPAIR, RIGHT, HX OF 03/29/2008  . GOITER, MULTINODULAR 12/26/2007  . Depression 07/07/2007  . HIP PAIN, RIGHT 03/11/2007  . RESTLESS LEG SYNDROME, HX OF 03/11/2007  . Diabetes mellitus type II, uncontrolled (HCC) 03/07/2007  . Essential hypertension 03/07/2007    Sharmon Leyden, PT, MPT 09/20/2020, 10:11 AM  [facility] [address] [place], [place], [address] Phone: [phone]   Fax:  [phone]  Name: [name] MRN: [id number] Date of Birth: 05/11/1946

## 2020-09-21 ENCOUNTER — Other Ambulatory Visit: Payer: Self-pay

## 2020-09-21 ENCOUNTER — Telehealth: Payer: Self-pay | Admitting: Pharmacist

## 2020-09-21 MED ORDER — FLUOXETINE HCL 40 MG PO CAPS: 40.0000 mg | ORAL_CAPSULE | Freq: Every day | ORAL | 1 refills | Status: DC

## 2020-09-21 NOTE — Research (Signed)
COORDINATE-Diabetes 3 Month Patient Questionnaire (Intervention) Site #:   204   Patient ID: [id number]    3 MONTH QUESTIONNAIRE  Visit Date 09/15/2020  Vital Status [x]   Patient Alive > Proceed to Visit Status []   Patient Dead > Complete Death Form only []   Unknown > Proceed to Visit Status  Visit Status Was the interview completed? []  No >IF NO, Select reason why [] Unable to locate                                    [] No Valid Contacts (patients or alternates) [] Multiple attempts to valid contacts []  Patient no longer cared for at study clinic []  Patient withdrew []  Other, specify:                                           >IF NO, Last date of contact: / /             MM      DD        YYYY    [x] Yes >IF YES, Select source of Interview: []   Proxy [x]   Patient     3 Month Patient Questionnaire (Intervention)  Instructions:   1. Prior to speaking with the patient either over the phone or in person, print off or have available the patient's current list of medications.      IF conducting follow-up visit over the phone - Instruct the patient to gather all their pill bottles. 2. For the purpose of the COORDINATE-Diabetes Study, please document whether the patient is currently taking each of the following medication classes. 3. DO NOT PROMPT DURING FOLLOW-UP VISIT: IF subject mentions reaction to one of the following medications, complete the AE/SAE section and follow instructions in operations manual regarding Adverse event reporting to [employer] (BI).  MEDICATIONS  Medication Are you currently taking [insert name of previous med]? If currently taking: If started since last visit: If stopped since last visit:  ACE Inhibitor / Angiotensin Receptor Blocker (ARB) / Angiotensin Receptor Neprilysin inhibitor (ARNi) [] No >  [x] Yes > [] Stopped since last visit [] Never prescribed [] Started since last visit [x] Same medication as last     visit [] Different medication than        last visit [Study personnel to answer]  Documented in EHR? [] No  [x] Yes   If no, Verified by: [] Photo of bottle [] Copy of rx [] Dispensing       pharmacy [] Prescribing provider [] Other (specify):                    Date started:        / /             MM DD YYYY Who prescribed this medication for you? [] Cardiology provider > [] Study clinic [] Outside clinic [] Endocrinology provider [] Primary care provider [] Other provider > Specify:                             [] Unknown Date discontinued:        / /             MM DD YYYY Why did you stop taking this medication? (check all that apply) [] Allergic reaction [] Medication side effects [] Unable to       adhere/monitor []   Had an      operation/procedure      that required      stopping it [] Unable to afford it [] No longer wants        to take        this medication [] Provider decision [] Pregnancy [] Other (specify: ) [] Unknown Reason   If started or changed  What medication are you taking now? [] Benazepril (Lotensin) [] Captopril (Capoten) [] Enalapril (Vasotec) [] Fosinopril (Monopril) [] Lisinopril (Zestril, Prinivil) [] Quinapril (Accupril) [] Ramipril (Altace) [] Azilsartan (Edarbi) [] Candesartan (Atacand) [] Irbesartan (Avapro) [] Losartan (Cozaar) [] Olmesartan (Benicar) [] Telmisartan (Micardis) [] Valsartan (Diovan) [] Sacrubitril/Valsartan )      Medication Are you currently taking [insert name of previous med]? If currently taking: If started since last visit: If stopped since last visit:  Statin []  No >  [x] Yes > [] Stopped since last visit [] Never prescribed [] Started since last visit [x] Same medication as last        visit [] Different medication or dose     than last visit [Study personnel to answer]  Documented in EHR? [] No [x] Yes    If no, Verified by: [] Photo of bottle [] Copy of rx [] Dispensing pharmacy [] Prescribing provider [] Other (specify):                    Date  started:        / /             MM DD YYYY Who prescribed this medication for you? [] Cardiology provider  [] Study clinic [] Outside clinic [] Endocrinology provider [] Primary care provider [] Other provider  Specify:                             [] Unknown Date discontinued:        / /             MM DD YYYY Why did you stop taking this medication? (check all that apply) [] Allergic reaction [] Medication side effects  [] Muscle aches [] Weakness [] Joint pain [] Cognitive symptoms [] Other (specify):                          [] Unable to        adhere/monitor [] Had an    operation/procedure    that required stopping it [] Unable to afford it [] No longer wants       to take this            medication [] Provider decision [] Pregnancy [] Other (specify: ) [] Unknown Reason   If started or changed  What medication are you taking now? [] Atorvastatin (Lipitor) [] Fluvastatin (Lescol) [] Lovastatin (Mevacor) [] Pravastatin (Pravachol) [] Rosuvastatin (Crestor) [] Simvastatin (Zocor) [] Pitatavastatin (Livalo)  Dose: [] 1 mg  [] 10 mg [] 2 mg  []  20 mg [] 3 mg  []  40 mg [] 4 mg  []  60 mg [] 5 mg  []  80 mg  Frequency: [] Daily []  Less than daily      Medication Are you currently taking [insert name of previous med]? If currently taking: If started since last visit: If stopped since last visit:  SGLT2 Inhibitor [x] No   [] Yes [] Stopped since last visit [x] Never prescribed [] Started since last visit [] Same medication as last      visit [] Different medication than last visit [Study personnel to answer]  Documented in EHR? [] No [] Yes   If no, Verified by: [] Photo of bottle [] Copy of rx [] Dispensing       pharmacy [] Prescribing       provider [] Other (specify):  Date started:        / /             MM DD YYYY Who prescribed this medication for you? '[]'$ Cardiology provider  '[]'$ Study clinic '[]'$ Outside clinic '[]'$ Endocrinology provider '[]'$ Primary care  provider '[]'$ Other provider  Specify:                             '[]'$ Unknown Date discontinued:        / /             MM DD YYYY Why did you stop taking this medication? (check all that apply) '[]'$ Allergic reaction '[]'$ Medication side effects '[]'$ Unable to       adhere/monitor '[]'$ Had an        operation/procedure         that required           stopping it '[]'$ Patient unable to       afford it '[]'$ Patient no longer       wants to       take this medication '[]'$ Provider decision '[]'$ Pregnancy '[]'$ Other (specify: ) '[]'$ Unknown Reason   If started or changed  What medication are you taking now? '[]'$ Canaglifozin (Invokana) '[]'$ Dapagliflozin (Farxiga) '[]'$ Empaglifozin (Jardiance) '[]'$ Ertugliflozin (Steglatro)     GLP1 Receptor Agonist '[x]'$ No   '[]'$ Yes '[]'$ Stopped since last visit '[x]'$ Never prescribed '[]'$ Started since last visit '[]'$ Same medication as last      visit '[]'$ Different medication than       last visit [Study personnel to answer]  Documented in EHR? '[]'$ No '[]'$ Yes   If no, Verified by: '[]'$ Photo of bottle '[]'$ Copy of rx '[]'$ Dispensing       pharmacy '[]'$ Prescribing       provider '[]'$ Other (specify):                    Date started:        / /             MM DD YYYY Who prescribed this medication for you? '[]'$ Cardiology provider  '[]'$ Study clinic '[]'$ Outside clinic '[]'$ Endocrinology provider '[]'$ Primary care provider '[]'$ Other provider  Specify:                             '[]'$ Unknown Date discontinued:        / /             MM DD YYYY Why did you stop taking this medication? (check all that apply) '[]'$ Allergic reaction '[]'$ Medication side effects '[]'$ Unable to      adhere/monitor '[]'$ Had an    operation/procedure    that required stopping it '[]'$ Patient unable to        afford it '[]'$ Patient no longer        wants to take this        medication '[]'$ Provider decision '[]'$ Pregnancy '[]'$ Other (specify: ) '[]'$ Unknown Reason   If started or changed  What medication are you taking now? '[]'$ Albiglutide  (Tanzeum) '[]'$ Dulaglutide (Trulicity) '[]'$ Exanatide (Byetta, Bydureon) '[]'$ Liraglutide (Victoza, Saxenda) '[]'$ Lixisenatide (Adlyxin) '[]'$ Semaglutice (Ozempic)     12/16/2018 (EDC Release)

## 2020-09-21 NOTE — Progress Notes (Addendum)
Made outbound call to patient to inquire if she has completed her portions of the PAP application. Patient stated she just received the paperwork today, and would work on the application this evening. Advised that she complete her portion and return application and supporting documents to the office once finished.  Called the patient to inform her that the Endocrinologist requires her to been seen in the office before making any adjustments to herTresiba and Novolog. She was last seen by Dr. Talmage Nap on 08-05-2020. CPP is requesting consistently using Relion Novolin N 50 units twice daily and consistently using Relion Novolin R 15 units twice daily before meals. Currently patient is using Nololin R 30 units daily PRN for high blood sugar and Novolin N 30-60 units twice a day. Patient expressed understanding and stated she would call the office to make an appointment. I asked that she call me back one the appointment was made.  Patient called me back to inform me that she has made an appoint ment with Dr. Talmage Nap on 10-06-2020.  Corwin Levins, CMA Clinical Pharmacist Assistant [phone]  Hopeful that we can get patient moved to a basal/bolus regimen that will help with better control of her diabetes. Happy to assist however needed.   5 minutes spent in review, coordination, and documentation.   Reviewed by: Katrinka Blazing, PharmD, BCACP Clinical Pharmacist [facility] [phone]

## 2020-09-23 ENCOUNTER — Encounter: Payer: Medicare HMO | Admitting: Physical Therapy

## 2020-09-23 ENCOUNTER — Emergency Department (HOSPITAL_BASED_OUTPATIENT_CLINIC_OR_DEPARTMENT_OTHER): Payer: Medicare HMO

## 2020-09-23 ENCOUNTER — Emergency Department (HOSPITAL_BASED_OUTPATIENT_CLINIC_OR_DEPARTMENT_OTHER)
Admission: EM | Admit: 2020-09-23 | Discharge: 2020-09-23 | Disposition: A | Discharge status: Home / Self Care | Payer: Medicare HMO | Attending: Emergency Medicine | Admitting: Emergency Medicine

## 2020-09-23 ENCOUNTER — Telehealth: Payer: Self-pay

## 2020-09-23 ENCOUNTER — Other Ambulatory Visit: Payer: Self-pay

## 2020-09-23 ENCOUNTER — Encounter (HOSPITAL_BASED_OUTPATIENT_CLINIC_OR_DEPARTMENT_OTHER): Payer: Self-pay

## 2020-09-23 DIAGNOSIS — I2510 Atherosclerotic heart disease of native coronary artery without angina pectoris: Secondary | ICD-10-CM | POA: Insufficient documentation

## 2020-09-23 DIAGNOSIS — J189 Pneumonia, unspecified organism: Secondary | ICD-10-CM | POA: Diagnosis not present

## 2020-09-23 DIAGNOSIS — Z79899 Other long term (current) drug therapy: Secondary | ICD-10-CM | POA: Diagnosis not present

## 2020-09-23 DIAGNOSIS — Z794 Long term (current) use of insulin: Secondary | ICD-10-CM | POA: Insufficient documentation

## 2020-09-23 DIAGNOSIS — Z20822 Contact with and (suspected) exposure to covid-19: Secondary | ICD-10-CM | POA: Insufficient documentation

## 2020-09-23 DIAGNOSIS — Z7982 Long term (current) use of aspirin: Secondary | ICD-10-CM | POA: Insufficient documentation

## 2020-09-23 DIAGNOSIS — Z955 Presence of coronary angioplasty implant and graft: Secondary | ICD-10-CM | POA: Diagnosis not present

## 2020-09-23 DIAGNOSIS — I10 Essential (primary) hypertension: Secondary | ICD-10-CM | POA: Diagnosis not present

## 2020-09-23 DIAGNOSIS — Z87891 Personal history of nicotine dependence: Secondary | ICD-10-CM | POA: Insufficient documentation

## 2020-09-23 DIAGNOSIS — E119 Type 2 diabetes mellitus without complications: Secondary | ICD-10-CM | POA: Insufficient documentation

## 2020-09-23 DIAGNOSIS — R1110 Vomiting, unspecified: Secondary | ICD-10-CM | POA: Diagnosis not present

## 2020-09-23 DIAGNOSIS — R5383 Other fatigue: Secondary | ICD-10-CM | POA: Diagnosis present

## 2020-09-23 DIAGNOSIS — I4519 Other right bundle-branch block: Secondary | ICD-10-CM | POA: Diagnosis not present

## 2020-09-23 DIAGNOSIS — R0602 Shortness of breath: Secondary | ICD-10-CM | POA: Diagnosis not present

## 2020-09-23 LAB — CBC
HCT: 40.1 % (ref 36.0–46.0)
Hemoglobin: 13.5 g/dL (ref 12.0–15.0)
MCH: 29.7 pg (ref 26.0–34.0)
MCHC: 33.7 g/dL (ref 30.0–36.0)
MCV: 88.3 fL (ref 80.0–100.0)
Platelets: 215 10*3/uL (ref 150–400)
RBC: 4.54 MIL/uL (ref 3.87–5.11)
RDW: 12.8 % (ref 11.5–15.5)
WBC: 13.9 10*3/uL — ABNORMAL HIGH (ref 4.0–10.5)
nRBC: 0 % (ref 0.0–0.2)

## 2020-09-23 LAB — RESP PANEL BY RT-PCR (FLU A&B, COVID) ARPGX2
Influenza A by PCR: NEGATIVE
Influenza B by PCR: NEGATIVE
SARS Coronavirus 2 by RT PCR: NEGATIVE

## 2020-09-23 LAB — URINALYSIS, ROUTINE W REFLEX MICROSCOPIC
Bilirubin Urine: NEGATIVE
Glucose, UA: >=500 mg/dL — AB
Hgb urine dipstick: NEGATIVE
Ketones, ur: NEGATIVE mg/dL
Leukocytes,Ua: NEGATIVE
Nitrite: NEGATIVE
Protein, ur: 30 mg/dL — AB
Specific Gravity, Urine: 1.02 (ref 1.005–1.030)
pH: 5.5 (ref 5.0–8.0)

## 2020-09-23 LAB — COMPREHENSIVE METABOLIC PANEL
ALT: 22 U/L (ref 0–44)
AST: 21 U/L (ref 15–41)
Albumin: 4.5 g/dL (ref 3.5–5.0)
Alkaline Phosphatase: 43 U/L (ref 38–126)
Anion gap: 13 (ref 5–15)
BUN: 19 mg/dL (ref 8–23)
CO2: 22 mmol/L (ref 22–32)
Calcium: 9.9 mg/dL (ref 8.9–10.3)
Chloride: 102 mmol/L (ref 98–111)
Creatinine, Ser: 0.82 mg/dL (ref 0.44–1.00)
GFR, Estimated: >60 mL/min (ref >60)
Glucose, Bld: 208 mg/dL — ABNORMAL HIGH (ref 70–99)
Potassium: 3.9 mmol/L (ref 3.5–5.1)
Sodium: 137 mmol/L (ref 135–145)
Total Bilirubin: 0.6 mg/dL (ref 0.3–1.2)
Total Protein: 7.5 g/dL (ref 6.5–8.1)

## 2020-09-23 LAB — TROPONIN I (HIGH SENSITIVITY): Troponin I (High Sensitivity): 7 ng/L (ref <18)

## 2020-09-23 LAB — URINALYSIS, MICROSCOPIC (REFLEX): WBC, UA: NONE SEEN WBC/hpf (ref 0–5)

## 2020-09-23 LAB — LIPASE, BLOOD: Lipase: 30 U/L (ref 11–51)

## 2020-09-23 MED ORDER — DOXYCYCLINE HYCLATE 100 MG PO CAPS: 100.0000 mg | ORAL_CAPSULE | Freq: Two times a day (BID) | ORAL | 0 refills | Status: AC

## 2020-09-23 MED ORDER — SODIUM CHLORIDE 0.9 % IV SOLN
1.0000 g | Freq: Once | INTRAVENOUS | Status: AC
  Administered 2020-09-23: 1 g via INTRAVENOUS
  Filled 2020-09-23: qty 10

## 2020-09-23 MED ORDER — SODIUM CHLORIDE 0.9 % IV SOLN: INTRAVENOUS | Status: DC | PRN

## 2020-09-23 NOTE — ED Notes (Signed)
ED Provider at bedside. 

## 2020-09-23 NOTE — ED Triage Notes (Signed)
Pt had surgery on tooth on Wednesday, was given antibiotics. Felt unwell and like she was "choking" yesterday and been vomiting and short of breath today.

## 2020-09-23 NOTE — ED Provider Notes (Signed)
[facility] EMERGENCY DEPARTMENT Provider Note   CSN: [id number] Arrival date & time: 09/23/20  1420     History Chief Complaint  Patient presents with  . Shortness of Breath  . Emesis    [name] is a 74 y.o. female history of diabetes on insulin, hypertension, hyperlipidemia, coronary artery disease status post stent placement, presenting to emergency department with fatigue and chest fullness.  She reports that she had a dental procedure done 3 days ago.  2 days ago she began to have a feeling of chest fullness, associated with nausea and shortness of breath.  She feels weak and says this is exertional.  She reports a dry cough.  She denies any fevers or chills at home.  She denies ever having these symptoms before.  She denies any prior history of MI.  She did receive 2 doses of the Covid vaccine several months ago but not had a booster yet.  HPI     Past Medical History:  Diagnosis Date  . Blockage of coronary artery of heart ([place])   . Depression   . Diabetes mellitus   . Hyperlipidemia   . Hypertension   . Pancreatitis   . Restless leg syndrome     Patient Active Problem List   Diagnosis Date Noted  . Acute pain of right shoulder 08/18/2020  . Primary osteoarthritis of right knee 08/18/2020  . Impacted cerumen of right ear 05/24/2020  . Bilateral impacted cerumen 05/17/2020  . Hyperlipidemia associated with type 2 diabetes mellitus ([place]) 03/10/2020  . Cough 03/10/2020  . Seasonal allergies 03/10/2020  . Elevated rheumatoid factor 07/21/2018  . Gastroenteritis 07/21/2018  . Trigger middle finger of left hand 12/19/2017  . Ganglion cyst of dorsum of right wrist 12/19/2017  . Morton's neuroma of right foot 12/19/2017  . Preventative health care 12/13/2017  . Pain in both hands 12/13/2017  . Hyperlipidemia LDL goal <100 12/13/2017  . Abscess of upper gum 12/13/2017  . Spinal stenosis of cervical region 09/25/2016  . Cervical radiculopathy  09/06/2016  . Dyspnea 02/18/2016  . Chest pain 02/18/2016  . HLD (hyperlipidemia) 02/18/2016  . CAD in native artery 02/18/2016  . RLS (restless legs syndrome) 02/18/2016  . Pancreatitis 02/18/2016  . SOB (shortness of breath) 02/18/2016  . Uncontrolled type 2 diabetes mellitus with complication ([place])   . Tobacco abuse   . Osteoarthritis of right knee 07/05/2015  . Smoking 07/02/2013  . Acute upper respiratory infections of unspecified site 07/02/2013  . Flu vaccine need 07/02/2013  . Multinodular goiter 04/16/2013  . Bunion 07/07/2012  . BACK PAIN, THORACIC REGION, RIGHT 05/24/2009  . IRON DEFICIENCY 03/25/2009  . GERD 03/25/2009  . WEIGHT LOSS 03/25/2009  . SINUSITIS- ACUTE-NOS 11/25/2008  . Hyperlipidemia LDL goal <70 10/07/2008  . ACUTE PANCREATITIS 09/07/2008  . OVARIAN CYST 09/07/2008  . KNEE PAIN, RIGHT 05/24/2008  . CARPAL TUNNEL SYNDROME, RIGHT 05/12/2008  . Acute pain of both shoulders 04/02/2008  . ROTATOR CUFF REPAIR, RIGHT, HX OF 03/29/2008  . GOITER, MULTINODULAR 12/26/2007  . Depression 07/07/2007  . HIP PAIN, RIGHT 03/11/2007  . RESTLESS LEG SYNDROME, HX OF 03/11/2007  . Diabetes mellitus type II, uncontrolled ([place]) 03/07/2007  . Essential hypertension 03/07/2007    Past Surgical History:  Procedure Laterality Date  . CATARACT EXTRACTION, BILATERAL    . CORONARY ANGIOPLASTY WITH STENT PLACEMENT    . FOOT SURGERY Right    cyst removal  . ROTATOR CUFF REPAIR     right  .  ROTATOR CUFF REPAIR Left   . TUBAL LIGATION       OB History   No obstetric history on file.     Family History  Problem Relation Age of Onset  . Heart attack Father   . Heart attack Brother   . Diabetes Sister   . Cirrhosis Sister   . Hypertension Sister   . Cancer Mother   . Hypertension Mother   . Vision loss Mother   . Cancer Sister   . Sleep apnea Sister   . Heart Problems Sister   . Diabetes Sister   . Gout Sister   . Diabetes Son   . Heart attack Son   .  Diabetes Daughter   . Goiter Daughter   . Diabetes Daughter     Social History   Tobacco Use  . Smoking status: Former Smoker    Packs/day: 0.20    Types: Cigarettes    Quit date: 05/05/2020    Years since quitting: 0.3  . Smokeless tobacco: Never Used  Vaping Use  . Vaping Use: Never used  Substance Use Topics  . Alcohol use: No  . Drug use: No    Home Medications Prior to Admission medications   Medication Sig Start Date End Date Taking? Authorizing Provider  Alcohol Swabs (B-D SINGLE USE SWABS REGULAR) PADS USE AS DIRECTED 03/15/20   Carollee Herter, Alferd Apa, DO  alendronate (FOSAMAX) 70 MG tablet Take with a full glass of water on an empty stomach. 06/09/20   Ann Held, DO  ALPRAZolam Duanne Moron) 0.25 MG tablet Take 1 tablet by mouth three times a day if needed 05/17/20   Carollee Herter, Alferd Apa, DO  amLODipine (NORVASC) 10 MG tablet Take 1 tablet (10 mg total) by mouth daily. 08/29/20   Ann Held, DO  aspirin EC 81 MG tablet Take 81 mg by mouth daily.    [provider]  azithromycin (ZITHROMAX) 250 MG tablet Take 2 tablets by mouth on day 1, followed by 1 tablet by mouth daily for 4 days. 08/23/20   Saguier, Percell Miller, PA-C  Blood Glucose Calibration (TAI [facility] CONTROL) NORMAL SOLN  10/19/12   [provider]  Blood Glucose Monitoring Suppl (TRUE METRIX AIR GLUCOSE METER) w/Device KIT 1 each by Does not apply route 4 (four) times daily. 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    [provider]  doxycycline (VIBRAMYCIN) 100 MG capsule Take 1 capsule (100 mg total) by mouth 2 (two) times daily for 6 days. 09/23/20 09/29/20  Wyvonnia Dusky, MD  fenofibrate 160 MG tablet Take 1 tablet (160 mg total) by mouth daily. 06/09/20   Lowne Chase, Yvonne R, DO  FERRETTS 325 (106 Fe) MG TABS tablet Take 1 tablet by mouth daily.  02/10/20    [provider]  ferrous sulfate 325 (65 FE) MG tablet Take 325 mg by mouth daily with breakfast.     [provider]  Flaxseed, Linseed, (FLAXSEED OIL PO) Take 1 capsule by mouth 2 (two) times daily.     [provider]  FLUoxetine (PROZAC) 40 MG capsule Take 1 capsule (40 mg total) by mouth daily. 09/21/20   Ann Held, DO  fluticasone (FLONASE) 50 MCG/ACT nasal spray SHAKE LIQUID AND USE 2 SPRAYS IN [facility] 2 NOSTRIL DAILY 08/23/20   Roma Schanz R, DO  glucose  blood (TRUE METRIX BLOOD GLUCOSE TEST) test strip USE AS DIRECTED 4 TIMES A DAY 10/27/19   Lowne Chase, Alferd Apa, DO  HYDROcodone-homatropine (HYCODAN) 5-1.5 MG/5ML syrup Take 5 mLs by mouth every 6 (six) hours as needed. 08/23/20   Saguier, Percell Miller, PA-C  insulin regular (NOVOLIN R RELION) 100 units/mL injection Inject 0.3 mLs (30 Units total) into the skin daily as needed for high blood sugar (at noon if CBG >200 and will be eating). Patient taking differently: Inject 15 Units into the skin 2 (two) times daily before a meal.  03/01/16   Carollee Herter, Alferd Apa, DO  Insulin Syringe-Needle U-100 (BD INSULIN SYRINGE U/F) 31G X 5/16" 0.3 ML MISC 10 Units by Does not apply route 3 (three) times daily before meals. 02/02/20   Ann Held, DO  Lancet Devices (LANCING DEVICE) MISC  10/19/12   [provider]  levocetirizine (XYZAL) 5 MG tablet TAKE 1 TABLET(5 MG) BY MOUTH EVERY EVENING 08/23/20   Carollee Herter, Yvonne R, DO  lisinopril (ZESTRIL) 40 MG tablet Take 1 tablet (40 mg total) by mouth daily. 07/29/20   Buford Dresser, MD  meloxicam (MOBIC) 7.5 MG tablet Take 1 tablet (7.5 mg total) by mouth daily as needed for pain. 09/12/20   Roma Schanz R, DO  metoprolol succinate (TOPROL-XL) 50 MG 24 hr tablet TAKE 1 TABLET(50 MG) BY MOUTH DAILY WITH FOOD 09/19/20   Ann Held, DO  Multiple Vitamin (MULTIVITAMIN WITH MINERALS) TABS Take 1 tablet by mouth daily.    [provider]  neomycin-polymyxin-hydrocortisone (CORTISPORIN) 3.5-10000-1 OTIC suspension Place 4 drops into the left ear 3 (three) times daily as needed.    [provider]  NOVOLIN N RELION 100 UNIT/ML injection Inject 30-60 Units into the skin 2 (two) times daily. 50 units before breakfast and 50 units before bedtime 05/05/12   [provider]  Omega-3 Fatty Acids (FISH OIL) 1000 MG CAPS Take by mouth.    [provider]  rosuvastatin (CRESTOR) 20 MG tablet TAKE 1 TABLET(20 MG) BY MOUTH DAILY 07/23/20   Carollee Herter, Alferd Apa, DO  traMADol (ULTRAM) 50 MG tablet Take 1 tablet (50 mg total) by mouth 3 (three) times daily as needed. 08/24/20   Aundra Dubin, PA-C  TRUEplus Lancets 33G MISC USE AS DIRECTED 4 TIMES A DAY 10/27/19   Roma Schanz R, DO  vitamin E 400 UNIT capsule Take 400 Units by mouth daily.    [provider]    Allergies    Levemir [insulin detemir] and Metformin and related  Review of Systems   Review of Systems  Constitutional: Positive for fatigue. Negative for chills and fever.  HENT: Negative for ear pain and sore throat.   Eyes: Negative for pain and visual disturbance.  Respiratory: Positive for shortness of breath. Negative for cough.   Cardiovascular: Negative for chest pain and palpitations.  Gastrointestinal: Positive for nausea. Negative for abdominal pain and vomiting.  Genitourinary: Negative for dysuria and hematuria.  Musculoskeletal: Negative for arthralgias and back pain.  Skin: Negative for color change and rash.  Neurological: Negative for seizures, syncope and headaches.  Psychiatric/Behavioral: Negative for agitation and confusion.  All other systems reviewed and are negative.   Physical Exam Updated Vital Signs BP (!) 144/61 (BP Location: Right Arm)   Pulse 75   Temp 98.3 F (36.8 C) (Oral)   Resp 14   Ht 5' 5"  (1.651 m)   Wt 79.8 kg  SpO2 100%   BMI 29.29 kg/m   Physical Exam Vitals and  nursing note reviewed.  Constitutional:      General: She is not in acute distress.    Appearance: She is well-developed and well-nourished.  HENT:     Head: Normocephalic and atraumatic.  Eyes:     Conjunctiva/sclera: Conjunctivae normal.  Cardiovascular:     Rate and Rhythm: Normal rate and regular rhythm.     Heart sounds: No murmur heard.   Pulmonary:     Effort: Pulmonary effort is normal. No respiratory distress.     Breath sounds: Normal breath sounds.  Abdominal:     Palpations: Abdomen is soft.     Tenderness: There is no abdominal tenderness.  Musculoskeletal:        General: No edema.     Cervical back: Neck supple.  Skin:    General: Skin is warm and dry.  Neurological:     General: No focal deficit present.     Mental Status: She is alert and oriented to person, place, and time.  Psychiatric:        Mood and Affect: Mood and affect and mood normal.        Behavior: Behavior normal.     ED Results / Procedures / Treatments   Labs (all labs ordered are listed, but only abnormal results are displayed) Labs Reviewed  COMPREHENSIVE METABOLIC PANEL - Abnormal; Notable for the following components:      Result Value   Glucose, Bld 208 (*)    All other components within normal limits  CBC - Abnormal; Notable for the following components:   WBC 13.9 (*)    All other components within normal limits  URINALYSIS, ROUTINE W REFLEX MICROSCOPIC - Abnormal; Notable for the following components:   Glucose, UA >=500 (*)    Protein, ur 30 (*)    All other components within normal limits  URINALYSIS, MICROSCOPIC (REFLEX) - Abnormal; Notable for the following components:   Bacteria, UA RARE (*)    All other components within normal limits  RESP PANEL BY RT-PCR (FLU A&B, COVID) [id number]  LIPASE, BLOOD  TROPONIN I (HIGH SENSITIVITY)    EKG Per my interpretation shows NSR with bifascicular block pattern seen on prior tracings, no other acute ischemic  findings.  Radiology DG Chest 2 View  Result Date: 09/23/2020 CLINICAL DATA:  Shortness of breath. Tooth surgery on 1 stay. Vomiting. EXAM: CHEST - 2 VIEW COMPARISON:  Chest x-ray 08/11/2018 FINDINGS: The heart size and mediastinal contours are within normal limits. Vague streaky opacity at the bases. No definite focal consolidation. No pulmonary edema. No pleural effusion. No pneumothorax. No acute osseous abnormality. Multilevel degenerative changes of the spine. IMPRESSION: Vague streaky opacity at the bases that likely represents differential diagnosis includes inflammation (such as aspiration pneumonia) or infection. Electronically Signed   By: Iven Finn M.D.   On: 09/23/2020 15:20    Procedures Procedures (including critical care time)  Medications Ordered in ED Medications  cefTRIAXone (ROCEPHIN) 1 g in sodium chloride 0.9 % 100 mL IVPB (0 g Intravenous Stopped 09/23/20 1913)    ED Course  I have reviewed the triage vital signs and the nursing notes.  Pertinent labs & imaging results that were available during my care of the patient were reviewed by me and considered in my medical decision making (see chart for details).  This patient complains of cough, fatigue.  This involves an extensive number of treatment options, and is a  complaint that carries with it a high risk of complications and morbidity.  The differential diagnosis includes infection including covid vs anemia vs acs vs other  I ordered, reviewed, and interpreted labs, which included trop 7, WBC 13.9, Hgb 13.5, lipase wnl, CMP largely unremarkable (mild hyperglycemia), covid/flu negative I ordered medication IV rocephin for CAP I ordered imaging studies which included dg chest I independently visualized and interpreted imaging which showed opacity of the lung bases and the monitor tracing which showed NSR Additional history was obtained from daughter at bedside  Per my interpretation shows NSR with bifascicular  block pattern seen on prior tracings, no other acute ischemic findings.  After the interventions stated above, I reevaluated the patient and found her vitals were stable, and she had no acute complaints.  With this workup, I advised that we treat her for CAP with another week of doxycycline - I would have her stop her current clindamycin (prescribed as ppx for her dental procedure).  With her cardiac risk factors, I advised that they reach out to her cardiologist as well to arrange for outpatient follow up.  Her last stress test was in 2019.  Return precautions were given and explained to the patient and her daughters.      Final Clinical Impression(s) / ED Diagnoses Final diagnoses:  Community acquired pneumonia, unspecified laterality    Rx / DC Orders ED Discharge Orders         Ordered    doxycycline (VIBRAMYCIN) 100 MG capsule  2 times daily        09/23/20 2000           Wyvonnia Dusky, MD 09/24/20 1005

## 2020-09-23 NOTE — ED Notes (Signed)
Is taking abx meds since wed am, states she has a gum infection. Now has chest fullness, today began having vomiting and some shortness of breath, states she does not feel well and feeling weak.

## 2020-09-23 NOTE — Telephone Encounter (Signed)
Agree-- she should be seen in ER

## 2020-09-23 NOTE — Discharge Instructions (Signed)
Your x-ray and blood test today suggested that you may have pneumonia.  We treated this with a dose of IV antibiotics in the ER, and I prescribed 6 more days of an antibiotic called doxycycline, to take beginning tomorrow.  You should stop taking the clindamycin that you are already on.  I think is very important that you follow-up with your heart doctor this week if possible in the office.  You do have several risk factors for heart disease.  Although your tests in the ER did not show that you are having signs of a heart attack at this time, I did explain that our tests are not a definitive rule out.  You may need additional testing, such as a stress test, which can be done as an outpatient.  I expect your symptoms improve over the next several days.  If you notice that you are having worsening difficulty breathing, lightheadedness, feel like passing out, or worsening chest pressure, you should return to the ER.

## 2020-09-27 ENCOUNTER — Ambulatory Visit (INDEPENDENT_AMBULATORY_CARE_PROVIDER_SITE_OTHER): Payer: Medicare HMO | Admitting: Physical Therapy

## 2020-09-27 ENCOUNTER — Encounter: Payer: Self-pay | Admitting: Physical Therapy

## 2020-09-27 ENCOUNTER — Other Ambulatory Visit: Payer: Self-pay

## 2020-09-27 DIAGNOSIS — M25511 Pain in right shoulder: Secondary | ICD-10-CM | POA: Diagnosis not present

## 2020-09-27 DIAGNOSIS — M25611 Stiffness of right shoulder, not elsewhere classified: Secondary | ICD-10-CM | POA: Diagnosis not present

## 2020-09-27 DIAGNOSIS — R600 Localized edema: Secondary | ICD-10-CM

## 2020-09-27 DIAGNOSIS — M6281 Muscle weakness (generalized): Secondary | ICD-10-CM

## 2020-09-27 NOTE — Therapy (Signed)
[facility] Physical Therapy [address] [place], [place], [address] Phone: [phone]   Fax:  [phone]  Physical Therapy Treatment  Patient Details  Name: [name] MRN: [id number] Date of Birth: 06-05-1946 Referring Provider (PT): Tarry Kos, MD   Encounter Date: 09/27/2020   PT End of Session - 09/27/20 1134    Visit Number 3    Number of Visits 18    Date for PT Re-Evaluation 11/03/20    PT Start Time 1108    PT Stop Time 1145    PT Time Calculation (min) 37 min    Activity Tolerance Patient tolerated treatment well    Behavior During Therapy [facility] for tasks assessed/performed           Past Medical History:  Diagnosis Date  . Blockage of coronary artery of heart (HCC)   . Depression   . Diabetes mellitus   . Hyperlipidemia   . Hypertension   . Pancreatitis   . Restless leg syndrome     Past Surgical History:  Procedure Laterality Date  . CATARACT EXTRACTION, BILATERAL    . CORONARY ANGIOPLASTY WITH STENT PLACEMENT    . FOOT SURGERY Right    cyst removal  . ROTATOR CUFF REPAIR     right  . ROTATOR CUFF REPAIR Left   . TUBAL LIGATION      There were no vitals filed for this visit.   Subjective Assessment - 09/27/20 1116    Subjective Pt arriving to therapy reporting 7/10 in R shoulder. Pt reported she was in ED on Friday with nausea/ vomiting and SOB. Pt stating she was dx with right sided pneumonia. Pt currently on antibiotics.    Diagnostic tests XR "Mild degenerative changes of the [facility] joint glenohumeral joint.  No acute abnormalities.    Patient Stated Goals improve her shoulder    Currently in Pain? Yes    Pain Score 7     Pain Location Shoulder    Pain Orientation Right    Pain Descriptors / Indicators Aching;Sore    Pain Type Acute pain    Pain Onset More than a month ago                             [facility] Adult PT Treatment/Exercise - 09/27/20 0001      Exercises   Exercises Shoulder       Shoulder Exercises: Supine   External Rotation AAROM;Right;15 reps    External Rotation Limitations with cane    Flexion AAROM;Both;15 reps    Flexion Limitations with cane      Shoulder Exercises: Seated   Other Seated Exercises table slides: fleixon and ER x 10 holding 5 seconds each      Shoulder Exercises: Pulleys   Flexion 3 minutes    Scaption 3 minutes      Shoulder Exercises: ROM/Strengthening   [place] seated, circles clock wise and counter clockwise, flexion and scaption.      Vasopneumatic   Number Minutes Vasopneumatic  10 minutes    Vasopnuematic Location  Shoulder    Vasopneumatic Pressure Medium    Vasopneumatic Temperature  34      Manual Therapy   Manual therapy comments Rt shoulder PROM and GH mobs grade 1 to tolerance                    PT Short Term Goals - 09/20/20 1002  PT SHORT TERM GOAL #1   Title Pt will be I and compliant with HEP.    Status On-going      PT SHORT TERM GOAL #2   Title Pt will report 25% improvment in Rt shoulder.    Status On-going             PT Long Term Goals - 09/27/20 1134      PT LONG TERM GOAL #1   Title Pt will report >50% improvment in Rt shoulder.    Status On-going      PT LONG TERM GOAL #2   Title Pt will improve FOTO to at least 59% functional.    Status On-going      PT LONG TERM GOAL #3   Title Pt will improve Rt shoulder AROM to [facility].    Status On-going      PT LONG TERM GOAL #4   Title Pt will improve Rt shoulder strength to at least 4+    Period Weeks    Status On-going                 Plan - 09/27/20 1127    Clinical Impression Statement Pt tolerating treatment well. Pt arriving today with 7/10 pain in R shoulder. Pt requiring more rest breaks today during session due to recent dx of pneumonia. Continue skilled PT.    Examination-Activity Limitations Caring for Others;Carry;Lift;Sleep;Reach Overhead    Examination-Participation Restrictions  Cleaning;Driving;Laundry;Occupation    Stability/Clinical Decision Making Stable/Uncomplicated    Rehab Potential Good    PT Frequency 2x / week    PT Duration 8 weeks    PT Treatment/Interventions ADLs/Self Care Home Management;Cryotherapy;Electrical Stimulation;Iontophoresis 4mg /ml Dexamethasone;Moist Heat;Ultrasound;Gait training;Traction;Therapeutic activities;Therapeutic exercise;Neuromuscular re-education;Manual techniques;Passive range of motion;Dry needling;Joint Manipulations;Spinal Manipulations;Taping;Vasopneumatic Device    PT Next Visit Plan review and update HEP PRN, needs Rt shoulder ROM and RTC strength. Modalaties and manual PRN    PT Home Exercise Plan Access Code:    Consulted and Agree with Plan of Care Patient           Patient will benefit from skilled therapeutic intervention in order to improve the following deficits and impairments:  Decreased activity tolerance,Decreased mobility,Decreased range of motion,Decreased strength,Increased edema,Hypomobility,Increased fascial restricitons,Increased muscle spasms,Impaired UE functional use,Postural dysfunction,Pain  Visit Diagnosis: Acute pain of right shoulder  Stiffness of right shoulder, not elsewhere classified  Muscle weakness (generalized)  Localized edema     Problem List Patient Active Problem List   Diagnosis Date Noted  . Acute pain of right shoulder 08/18/2020  . Primary osteoarthritis of right knee 08/18/2020  . Impacted cerumen of right ear 05/24/2020  . Bilateral impacted cerumen 05/17/2020  . Hyperlipidemia associated with type 2 diabetes mellitus (HCC) 03/10/2020  . Cough 03/10/2020  . Seasonal allergies 03/10/2020  . Elevated rheumatoid factor 07/21/2018  . Gastroenteritis 07/21/2018  . Trigger middle finger of left hand 12/19/2017  . Ganglion cyst of dorsum of right wrist 12/19/2017  . Morton's neuroma of right foot 12/19/2017  . Preventative health care 12/13/2017  . Pain in  both hands 12/13/2017  . Hyperlipidemia LDL goal <100 12/13/2017  . Abscess of upper gum 12/13/2017  . Spinal stenosis of cervical region 09/25/2016  . Cervical radiculopathy 09/06/2016  . Dyspnea 02/18/2016  . Chest pain 02/18/2016  . HLD (hyperlipidemia) 02/18/2016  . CAD in native artery 02/18/2016  . RLS (restless legs syndrome) 02/18/2016  . Pancreatitis 02/18/2016  . SOB (shortness of breath) 02/18/2016  . Uncontrolled type  2 diabetes mellitus with complication (HCC)   . Tobacco abuse   . Osteoarthritis of right knee 07/05/2015  . Smoking 07/02/2013  . Acute upper respiratory infections of unspecified site 07/02/2013  . Flu vaccine need 07/02/2013  . Multinodular goiter 04/16/2013  . Bunion 07/07/2012  . BACK PAIN, THORACIC REGION, RIGHT 05/24/2009  . IRON DEFICIENCY 03/25/2009  . GERD 03/25/2009  . WEIGHT LOSS 03/25/2009  . SINUSITIS- ACUTE-NOS 11/25/2008  . Hyperlipidemia LDL goal <70 10/07/2008  . ACUTE PANCREATITIS 09/07/2008  . OVARIAN CYST 09/07/2008  . KNEE PAIN, RIGHT 05/24/2008  . CARPAL TUNNEL SYNDROME, RIGHT 05/12/2008  . Acute pain of both shoulders 04/02/2008  . ROTATOR CUFF REPAIR, RIGHT, HX OF 03/29/2008  . GOITER, MULTINODULAR 12/26/2007  . Depression 07/07/2007  . HIP PAIN, RIGHT 03/11/2007  . RESTLESS LEG SYNDROME, HX OF 03/11/2007  . Diabetes mellitus type II, uncontrolled (HCC) 03/07/2007  . Essential hypertension 03/07/2007    Sharmon Leyden, PT, MPT 09/27/2020, 11:36 AM  [facility] Physical Therapy [address] [place], [place], [address] Phone: [phone]   Fax:  [phone]  Name: [name] MRN: [id number] Date of Birth: 1946-06-21

## 2020-09-29 ENCOUNTER — Ambulatory Visit (INDEPENDENT_AMBULATORY_CARE_PROVIDER_SITE_OTHER): Payer: Medicare HMO | Admitting: Physical Therapy

## 2020-09-29 ENCOUNTER — Encounter: Payer: Self-pay | Admitting: Physical Therapy

## 2020-09-29 ENCOUNTER — Other Ambulatory Visit: Payer: Self-pay

## 2020-09-29 DIAGNOSIS — M25611 Stiffness of right shoulder, not elsewhere classified: Secondary | ICD-10-CM

## 2020-09-29 DIAGNOSIS — R600 Localized edema: Secondary | ICD-10-CM

## 2020-09-29 DIAGNOSIS — M25511 Pain in right shoulder: Secondary | ICD-10-CM

## 2020-09-29 DIAGNOSIS — M6281 Muscle weakness (generalized): Secondary | ICD-10-CM

## 2020-09-29 NOTE — Therapy (Addendum)
[facility] Physical Therapy [address] [place], [place], [address] Phone: [phone]   Fax:  [phone]  Physical Therapy Treatment/Discharge addendum PHYSICAL THERAPY DISCHARGE SUMMARY  Visits from Start of Care: 4  Current functional level related to goals / functional outcomes: See below   Remaining deficits: See below   Education / Equipment: HEP Plan: Patient agrees to discharge.  Patient goals were not met. Patient is being discharged due to not returning since the last visit.  ?????  [name], PT, DPT 11/16/20 8:21 AM      Patient Details  Name: [name] MRN: [id number] Date of Birth: September 12, 1946 Referring Provider (PT): Leandrew Koyanagi, MD   Encounter Date: 09/29/2020   PT End of Session - 09/29/20 0920    Visit Number 4    Number of Visits 18    Date for PT Re-Evaluation 11/03/20    PT Start Time 9449    PT Stop Time 0933    PT Time Calculation (min) 39 min    Activity Tolerance Patient tolerated treatment well    Behavior During Therapy [facility] for tasks assessed/performed           Past Medical History:  Diagnosis Date  . Blockage of coronary artery of heart ([place])   . Depression   . Diabetes mellitus   . Hyperlipidemia   . Hypertension   . Pancreatitis   . Restless leg syndrome     Past Surgical History:  Procedure Laterality Date  . CATARACT EXTRACTION, BILATERAL    . CORONARY ANGIOPLASTY WITH STENT PLACEMENT    . FOOT SURGERY Right    cyst removal  . ROTATOR CUFF REPAIR     right  . ROTATOR CUFF REPAIR Left   . TUBAL LIGATION      There were no vitals filed for this visit.   Subjective Assessment - 09/29/20 0914    Subjective Relays her pain is a 7-8 in her Rt shoulder, she now has workers comp info so PT is having front office work on getting approaval to file workers comp insurance vs her [employer]. She also relays she just does not feel well in general with stomach pain.    Diagnostic tests XR "Mild  degenerative changes of the [facility] joint glenohumeral joint.  No acute abnormalities.    Patient Stated Goals improve her shoulder    Pain Onset More than a month ago              [facility] PT Assessment - 09/29/20 0001      Assessment   Medical Diagnosis Right rotator cuff syndrome, partial tear    Referring Provider (PT) Leandrew Koyanagi, MD      AROM   Right Shoulder Flexion 113 Degrees    Right Shoulder ABduction 107 Degrees      Strength   Right Shoulder Flexion 3-/5    Right Shoulder ABduction 3-/5    Right Shoulder Internal Rotation 4/5    Right Shoulder External Rotation 3+/5                         [facility] Adult PT Treatment/Exercise - 09/29/20 0001      Shoulder Exercises: Seated   Retraction Both;15 reps   holding 5 sec   Other Seated Exercises table slides: fleixon, abd and ER x 10 holding 5 seconds each      Shoulder Exercises: Standing   Other Standing Exercises wall ladder X 5 fleixon, and  X 5 abd holding 5 sec      Shoulder Exercises: Pulleys   Flexion 3 minutes    ABduction 3 minutes      Vasopneumatic   Number Minutes Vasopneumatic  10 minutes    Vasopnuematic Location  Shoulder    Vasopneumatic Pressure Medium    Vasopneumatic Temperature  34                    PT Short Term Goals - 09/29/20 0925      PT SHORT TERM GOAL #1   Title Pt will be I and compliant with HEP.    Status On-going      PT SHORT TERM GOAL #2   Title Pt will report 25% improvment in Rt shoulder.    Status On-going             PT Long Term Goals - 09/27/20 1134      PT LONG TERM GOAL #1   Title Pt will report >50% improvment in Rt shoulder.    Status On-going      PT LONG TERM GOAL #2   Title Pt will improve FOTO to at least 59% functional.    Status On-going      PT LONG TERM GOAL #3   Title Pt will improve Rt shoulder AROM to [facility].    Status On-going      PT LONG TERM GOAL #4   Title Pt will improve Rt shoulder strength to at least 4+     Period Weeks    Status On-going                 Plan - 09/29/20 0924    Clinical Impression Statement [place] office is working on switching insurance over to workers comp. Updated measurments do show small improvements in Rt shoulder AROM but still very limited with ROM, strength, and pain. She will continue to benefit from skilled PT to address these deficits.    Examination-Activity Limitations Caring for Others;Carry;Lift;Sleep;Reach Overhead    Examination-Participation Restrictions Cleaning;Driving;Laundry;Occupation    Stability/Clinical Decision Making Stable/Uncomplicated    Rehab Potential Good    PT Frequency 2x / week    PT Duration 8 weeks    PT Treatment/Interventions ADLs/Self Care Home Management;Cryotherapy;Electrical Stimulation;Iontophoresis 67m/ml Dexamethasone;Moist Heat;Ultrasound;Gait training;Traction;Therapeutic activities;Therapeutic exercise;Neuromuscular re-education;Manual techniques;Passive range of motion;Dry needling;Joint Manipulations;Spinal Manipulations;Taping;Vasopneumatic Device    PT Next Visit Plan review and update HEP PRN, needs Rt shoulder ROM and RTC strength. Modalaties and manual PRN    PT Home Exercise Plan Access Code: [email or web address]   Consulted and Agree with Plan of Care Patient           Patient will benefit from skilled therapeutic intervention in order to improve the following deficits and impairments:  Decreased activity tolerance,Decreased mobility,Decreased range of motion,Decreased strength,Increased edema,Hypomobility,Increased fascial restricitons,Increased muscle spasms,Impaired UE functional use,Postural dysfunction,Pain  Visit Diagnosis: Acute pain of right shoulder  Stiffness of right shoulder, not elsewhere classified  Muscle weakness (generalized)  Localized edema     Problem List Patient Active Problem List   Diagnosis Date Noted  . Acute pain of right shoulder 08/18/2020  . Primary osteoarthritis of right knee  08/18/2020  . Impacted cerumen of right ear 05/24/2020  . Bilateral impacted cerumen 05/17/2020  . Hyperlipidemia associated with type 2 diabetes mellitus ([place] 03/10/2020  . Cough 03/10/2020  . Seasonal allergies 03/10/2020  . Elevated rheumatoid factor 07/21/2018  . Gastroenteritis 07/21/2018  . Trigger middle finger of left hand  12/19/2017  . Ganglion cyst of dorsum of right wrist 12/19/2017  . Morton's neuroma of right foot 12/19/2017  . Preventative health care 12/13/2017  . Pain in both hands 12/13/2017  . Hyperlipidemia LDL goal <100 12/13/2017  . Abscess of upper gum 12/13/2017  . Spinal stenosis of cervical region 09/25/2016  . Cervical radiculopathy 09/06/2016  . Dyspnea 02/18/2016  . Chest pain 02/18/2016  . HLD (hyperlipidemia) 02/18/2016  . CAD in native artery 02/18/2016  . RLS (restless legs syndrome) 02/18/2016  . Pancreatitis 02/18/2016  . SOB (shortness of breath) 02/18/2016  . Uncontrolled type 2 diabetes mellitus with complication ([place])   . Tobacco abuse   . Osteoarthritis of right knee 07/05/2015  . Smoking 07/02/2013  . Acute upper respiratory infections of unspecified site 07/02/2013  . Flu vaccine need 07/02/2013  . Multinodular goiter 04/16/2013  . Bunion 07/07/2012  . BACK PAIN, THORACIC REGION, RIGHT 05/24/2009  . IRON DEFICIENCY 03/25/2009  . GERD 03/25/2009  . WEIGHT LOSS 03/25/2009  . SINUSITIS- ACUTE-NOS 11/25/2008  . Hyperlipidemia LDL goal <70 10/07/2008  . ACUTE PANCREATITIS 09/07/2008  . OVARIAN CYST 09/07/2008  . KNEE PAIN, RIGHT 05/24/2008  . CARPAL TUNNEL SYNDROME, RIGHT 05/12/2008  . Acute pain of both shoulders 04/02/2008  . ROTATOR CUFF REPAIR, RIGHT, HX OF 03/29/2008  . GOITER, MULTINODULAR 12/26/2007  . Depression 07/07/2007  . HIP PAIN, RIGHT 03/11/2007  . RESTLESS LEG SYNDROME, HX OF 03/11/2007  . Diabetes mellitus type II, uncontrolled ([place]) 03/07/2007  . Essential hypertension 03/07/2007    Silvestre Mesi 09/29/2020, 9:25 AM  [facility] Physical Therapy [address] [place], [place], [address] Phone: [phone]   Fax:  [phone]  Name: YILIN WEEDON MRN: [id number] Date of Birth: April 24, 1946

## 2020-10-04 ENCOUNTER — Encounter: Payer: Medicare HMO | Admitting: Physical Therapy

## 2020-10-06 ENCOUNTER — Encounter: Payer: Medicare HMO | Admitting: Physical Therapy

## 2020-10-10 ENCOUNTER — Telehealth: Payer: Self-pay | Admitting: Pharmacist

## 2020-10-10 ENCOUNTER — Other Ambulatory Visit: Payer: Self-pay | Admitting: Family Medicine

## 2020-10-10 NOTE — Progress Notes (Addendum)
Made outbound call to Dr. Willeen Cass office for [facility] medication request. I spoke with Joni Reining who informed me that the patient has a history of no showing or canceling appointments with their office. Dr. Talmage Nap is not willing to forward any medication refills without a visit in office first.  I attempted to communicate this information to the patient but was unable to make contact. LMTRC.  Made contact with the patient to inform her of the information above. Patient stated she hadn't been feeling well. She canceled her appointment with Dr. Talmage Nap last week due to her not feeling well and hadn't gotten around to rescheduling. Patient reported have some nausea and feeling weak with low energy. Advised that she contact Dr. Willeen Cass office and reschedule her appointment in order to get the medication she needed. The office is not willing to send any refills or make changes to her diabetic medicines without an appointment, and the lack of appropriate medication may be the cause her of recent symptoms. Patient expressed understanding and said that she would call today to make an appointment.   Corwin Levins, CMA Clinical Pharmacist Assistant [phone]  3 minutes spent in review, coordination, and documentation.   Reviewed by: Katrinka Blazing, PharmD, BCACP Clinical Pharmacist [facility] [phone]

## 2020-10-13 ENCOUNTER — Telehealth: Payer: Self-pay | Admitting: Pharmacist

## 2020-10-13 DIAGNOSIS — E1140 Type 2 diabetes mellitus with diabetic neuropathy, unspecified: Secondary | ICD-10-CM | POA: Diagnosis not present

## 2020-10-13 DIAGNOSIS — E049 Nontoxic goiter, unspecified: Secondary | ICD-10-CM | POA: Diagnosis not present

## 2020-10-13 DIAGNOSIS — E1165 Type 2 diabetes mellitus with hyperglycemia: Secondary | ICD-10-CM | POA: Diagnosis not present

## 2020-10-13 DIAGNOSIS — R809 Proteinuria, unspecified: Secondary | ICD-10-CM | POA: Diagnosis not present

## 2020-10-13 DIAGNOSIS — I10 Essential (primary) hypertension: Secondary | ICD-10-CM | POA: Diagnosis not present

## 2020-10-13 DIAGNOSIS — E1121 Type 2 diabetes mellitus with diabetic nephropathy: Secondary | ICD-10-CM | POA: Diagnosis not present

## 2020-10-13 DIAGNOSIS — E7800 Pure hypercholesterolemia, unspecified: Secondary | ICD-10-CM | POA: Diagnosis not present

## 2020-10-13 NOTE — Progress Notes (Addendum)
Chronic Care Management Pharmacy Assistant   Name: [name]  MRN: [id number] DOB: February 03, 1946  Reason for Encounter: Patient phone call   PCP : Ann Held, DO  Allergies:   Allergies  Allergen Reactions   Levemir [Insulin Detemir] Hives and Swelling   Metformin And Related Other (See Comments)    Sick on stomach, sweaty, throw up, pain    Medications: Outpatient Encounter Medications as of 10/13/2020  Medication Sig   Alcohol Swabs (B-D SINGLE USE SWABS REGULAR) PADS USE AS DIRECTED   alendronate (FOSAMAX) 70 MG tablet Take with a full glass of water on an empty stomach.   ALPRAZolam (XANAX) 0.25 MG tablet Take 1 tablet by mouth three times a day if needed   amLODipine (NORVASC) 10 MG tablet Take 1 tablet (10 mg total) by mouth daily.   aspirin EC 81 MG tablet Take 81 mg by mouth daily.   azithromycin (ZITHROMAX) 250 MG tablet Take 2 tablets by mouth on day 1, followed by 1 tablet by mouth daily for 4 days.   Blood Glucose Calibration (TAI DOC CONTROL) NORMAL SOLN    Blood Glucose Monitoring Suppl (TRUE METRIX AIR GLUCOSE METER) w/Device KIT 1 each by Does not apply route 4 (four) times daily. 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   fenofibrate 160 MG tablet Take 1 tablet (160 mg total) by mouth daily.   FERRETTS 325 (106 Fe) MG TABS tablet Take 1 tablet by mouth daily.    ferrous sulfate 325 (65 FE) MG tablet Take 325 mg by mouth daily with breakfast.    Flaxseed, Linseed, (FLAXSEED OIL PO) Take 1 capsule by mouth 2 (two) times daily.    FLUoxetine (PROZAC) 40 MG capsule Take 1 capsule (40 mg total) by mouth daily.   fluticasone (FLONASE) 50 MCG/ACT nasal spray SHAKE LIQUID AND USE 2 SPRAYS IN EACH NOSTRIL DAILY   glucose blood (TRUE METRIX BLOOD GLUCOSE TEST) test strip USE AS DIRECTED 4 TIMES A DAY   HYDROcodone-homatropine (HYCODAN) 5-1.5 MG/5ML syrup Take 5 mLs by mouth every  6 (six) hours as needed.   insulin regular (NOVOLIN R RELION) 100 units/mL injection Inject 0.3 mLs (30 Units total) into the skin daily as needed for high blood sugar (at noon if CBG >200 and will be eating). (Patient taking differently: Inject 15 Units into the skin 2 (two) times daily before a meal. )   Insulin Syringe-Needle U-100 (BD INSULIN SYRINGE U/F) 31G X 5/16" 0.3 ML MISC 10 Units by Does not apply route 3 (three) times daily before meals.   Lancet Devices (LANCING DEVICE) MISC    levocetirizine (XYZAL) 5 MG tablet TAKE 1 TABLET(5 MG) BY MOUTH EVERY EVENING   lisinopril (ZESTRIL) 40 MG tablet Take 1 tablet (40 mg total) by mouth daily.   meloxicam (MOBIC) 7.5 MG tablet Take 1 tablet (7.5 mg total) by mouth daily as needed for pain.   metoprolol succinate (TOPROL-XL) 50 MG 24 hr tablet TAKE 1 TABLET(50 MG) BY MOUTH DAILY WITH FOOD   Multiple Vitamin (MULTIVITAMIN WITH MINERALS) TABS Take 1 tablet by mouth daily.   neomycin-polymyxin-hydrocortisone (CORTISPORIN) 3.5-10000-1 OTIC suspension Place 4 drops into the left ear 3 (three) times daily as needed.   NOVOLIN N RELION 100 UNIT/ML injection Inject 30-60 Units into the skin 2 (two) times daily. 50 units before breakfast and 50 units before bedtime   Omega-3 Fatty Acids (  FISH OIL) 1000 MG CAPS Take by mouth.   rosuvastatin (CRESTOR) 20 MG tablet TAKE 1 TABLET(20 MG) BY MOUTH DAILY   traMADol (ULTRAM) 50 MG tablet Take 1 tablet (50 mg total) by mouth 3 (three) times daily as needed.   TRUEplus Lancets 33G MISC USE AS DIRECTED 4 TIMES A DAY   vitamin E 400 UNIT capsule Take 400 Units by mouth daily.   No facility-administered encounter medications on file as of 10/13/2020.    Current Diagnosis: Patient Active Problem List   Diagnosis Date Noted   Acute pain of right shoulder 08/18/2020   Primary osteoarthritis of right knee 08/18/2020   Impacted cerumen of right ear 05/24/2020   Bilateral impacted cerumen 05/17/2020   Hyperlipidemia  associated with type 2 diabetes mellitus ([place]) 03/10/2020   Cough 03/10/2020   Seasonal allergies 03/10/2020   Elevated rheumatoid factor 07/21/2018   Gastroenteritis 07/21/2018   Trigger middle finger of left hand 12/19/2017   Ganglion cyst of dorsum of right wrist 12/19/2017   Morton's neuroma of right foot 12/19/2017   Preventative health care 12/13/2017   Pain in both hands 12/13/2017   Hyperlipidemia LDL goal <100 12/13/2017   Abscess of upper gum 12/13/2017   Spinal stenosis of cervical region 09/25/2016   Cervical radiculopathy 09/06/2016   Dyspnea 02/18/2016   Chest pain 02/18/2016   HLD (hyperlipidemia) 02/18/2016   CAD in native artery 02/18/2016   RLS (restless legs syndrome) 02/18/2016   Pancreatitis 02/18/2016   SOB (shortness of breath) 02/18/2016   Uncontrolled type 2 diabetes mellitus with complication ([facility])    Tobacco abuse    Osteoarthritis of right knee 07/05/2015   Smoking 07/02/2013   Acute upper respiratory infections of unspecified site 07/02/2013   Flu vaccine need 07/02/2013   Multinodular goiter 04/16/2013   Bunion 07/07/2012   BACK PAIN, THORACIC REGION, RIGHT 05/24/2009   IRON DEFICIENCY 03/25/2009   GERD 03/25/2009   WEIGHT LOSS 03/25/2009   SINUSITIS- ACUTE-NOS 11/25/2008   Hyperlipidemia LDL goal <70 10/07/2008   ACUTE PANCREATITIS 09/07/2008   OVARIAN CYST 09/07/2008   KNEE PAIN, RIGHT 05/24/2008   CARPAL TUNNEL SYNDROME, RIGHT 05/12/2008   Acute pain of both shoulders 04/02/2008   ROTATOR CUFF REPAIR, RIGHT, HX OF 03/29/2008   GOITER, MULTINODULAR 12/26/2007   Depression 07/07/2007   HIP PAIN, RIGHT 03/11/2007   RESTLESS LEG SYNDROME, HX OF 03/11/2007   Diabetes mellitus type II, uncontrolled ([place]) 03/07/2007   Essential hypertension 03/07/2007    Goals Addressed   None    Patient called me to inform me that she was able to complete a visit with Dr. Chalmers Cater, and her insulin regimen was updated. The new prescription was sent to  [facility] pharmacy instead of [facility]. I offered to contact the physician's office and request the medication be sent to the appropriate pharmacy. Made outbound call to Dr. Almetta Lovely office at [phone] to request new insulin prescription be sent to [facility]. Spoke with Elmyra Ricks who was unable to locate an updated regimen. I was then transferred to the nurse, Lovena Le, who stated patient has started on Humulin 70-30 mix 60 units twice day and Januvia 100 mg daily. She would be sending these prescriptions to [facility] pharmacy.  Also sending current prescriptions for Jardiance, needles, test strips, and lancets.  Follow-Up:  Pharmacist Review   Fanny Skates, [place] Pharmacist Assistant [phone]  Noted prescriptions have been received and delivered to patient on 10/14/20.   Reviewed by: De Blanch, PharmD, [facility] Clinical Pharmacist [place]  [facility] at [place]

## 2020-10-14 ENCOUNTER — Ambulatory Visit: Payer: Medicare HMO | Admitting: Pharmacist

## 2020-10-14 DIAGNOSIS — E1169 Type 2 diabetes mellitus with other specified complication: Secondary | ICD-10-CM

## 2020-10-14 DIAGNOSIS — E1165 Type 2 diabetes mellitus with hyperglycemia: Secondary | ICD-10-CM

## 2020-10-14 DIAGNOSIS — I10 Essential (primary) hypertension: Secondary | ICD-10-CM

## 2020-10-14 NOTE — Patient Instructions (Addendum)
Visit Information  Goals Addressed            This Visit's Progress   . Chronic Care Management Pharmacy Care Plan       CARE PLAN ENTRY (see longitudinal plan of care for additional care plan information)  Current Barriers:  . Chronic Disease Management support, education, and care coordination needs related to Hypertension, Hyperlipidemia/Hx of CAD, Diabetes, Tobacco Use Disorder, Depression/Anxiety, Osteoporosis, Neuropathy   Hypertension BP Readings from Last 3 Encounters:  09/23/20 (!) 144/61  08/23/20 (!) 153/83  08/15/20 140/61   . Pharmacist Clinical Goal(s): o Over the next 90 days, patient will work with PharmD and providers to achieve BP goal <130/80 . Current regimen:  . Amlodipine 10mg  daily . Lisinopril 40mg  daily . Metoprolol Succinate 50mg  daily . Interventions: o Requested patient to check her blood pressure 2-3 times per week and record . Patient self care activities - Over the next 90 days, patient will: o Check BP 2-3 times per week, document, and provide at future appointments o Ensure daily salt intake < 2300 mg/Johanna Stafford  Hyperlipidemia Lab Results  Component Value Date/Time   LDLCALC 61 06/15/2020 08:19 AM   LDLDIRECT 100.0 03/10/2020 02:10 PM   . Pharmacist Clinical Goal(s): o Over the next 90 days, patient will work with PharmD and providers to maintain LDL goal < 70 . Current regimen:  . Aspirin 81mg  daily . Fenofibrate 160mg  daily . Flaxseed Oil 1 capsule twice daily  . Omega 3 fish Oil 1030mg  twice daily . Rosuvastatin 20mg  daily . Interventions: o Discussed limited efficacy of taking fish oil and flaxseed oil. Will continue this discussion o Discussed that patient should be taking rosuvastatin vs atorvastatin (This was changed June 2021) . Patient self care activities - Over the next 90 days, patient will: o A low fat, low cholesterol is discussed with the patient, and a written copy is given to her.   Diabetes Lab Results  Component  Value Date/Time   HGBA1C 10.0 (H) 06/15/2020 08:19 AM   HGBA1C 9.4 (H) 03/10/2020 02:10 PM   . Pharmacist Clinical Goal(s): o Over the next 180 days, patient will work with PharmD and providers to achieve A1c goal <7% . Current regimen:  . Humulin 70/30 60 units twice daily Interventions: o Discussed DM goals - Fasting (before meal blood sugar): 80-130 - Post-Prandial (2 hrs after eating blood sugar): less than 180 - A1c (3 month blood sugar average): less than 7% o Requested patient to check BG twice daily o Collaboration with provider regarding medication management (transition to basal/bolus regimen) . Patient self care activities - Over the next 90 days, patient will: o Check blood sugar twice daily, document, and provide at future appointments o Contact provider with any episodes of hypoglycemia  Depression . Pharmacist Clinical Goal(s) o Over the next 90 days, patient will work with PharmD and providers to reduce symptoms associated with depression . Current regimen:  . Alprazolam 0.25mg  three times daily as needed  . Fluoxetine 40mg  daily . Interventions: o Collaboration with provider regarding medication management (consider increase of fluoxetine to 40mg  daily - accepted) . Patient self care activities - Over the next 90 days, patient will: o Maintain depression medication regimen  Osteoporosis . Pharmacist Clinical Goal(s) o Over the next 180 days, patient will work with PharmD and providers to reduce risk of fracture due to osteoporosis  . Current regimen:  o Alendronate 70mg  weekly . Interventions: o Consider repeat DEXA Scan . Patient self care activities -  Over the next 180 days, patient will: o Consider repeat DEXA Scan  Medication management . Pharmacist Clinical Goal(s): o Over the next 90 days, patient will work with PharmD and providers to maintain optimal medication adherence . Current pharmacy: [facility] switched to  [facility] . Interventions o Comprehensive medication review performed. o Utilize [facility] pharmacy for medication synchronization, packaging and delivery . Patient self care activities - Over the next 90 days, patient will: o Focus on medication adherence by filling and taking medications appropriately  o Take medications as prescribed o Report any questions or concerns to PharmD and/or provider(s)  Please see past updates related to this goal by clicking on the "Past Updates" button in the selected goal         The patient verbalized understanding of instructions, educational materials, and care plan provided today and agreed to receive a mailed copy of patient instructions, educational materials, and care plan.   Telephone follow up appointment with pharmacy team member scheduled for: 01/12/2021  Dannielle Karvonen Jequan Shahin, [facility]

## 2020-10-14 NOTE — Chronic Care Management (AMB) (Cosign Needed)
Chronic Care Management Pharmacy  Name: [name]  MRN: [id number] DOB: 1946/02/20  Chief Complaint/ HPI  [name],  75 y.o. , female presents for their Follow-Up CCM visit with the clinical pharmacist via telephone due to COVID-19 Pandemic.  PCP : Ann Held, DO  Their chronic conditions include: Hypertension, Hyperlipidemia/Hx of CAD, Diabetes, Tobacco Use Disorder, Depression/Anxiety, Osteoporosis, Neuropathy  Office Visits: 08/23/20: Visit w/ Mackie Pai, PA-C - Allergic Rhinitis: "Will prescribe Xyzal antihistamine, Flonase nasal spray and making Hycodan available based on described severity.  Also notes that typically if severe cough will resolve illness from cough suppressant type med given.  Rx advisement given on Hycodan." Azithromycin given in case symptoms worsen. Follow up in 7 days or as needed.   Consult Visit: 10/13/20: Endo visit w/ Dr. Chalmers Cater - Pertinent Labs: GFR 69.37, TC 138, TRIG 275, LDL 50, HDL 33, TSH 1.18. Change to Humulin 70/30 60 units twice daily  Medications: Outpatient Encounter Medications as of 10/14/2020  Medication Sig Note  . Alcohol Swabs (B-D SINGLE USE SWABS REGULAR) PADS USE AS DIRECTED   . alendronate (FOSAMAX) 70 MG tablet Take with a full glass of water on an empty stomach.   . ALPRAZolam (XANAX) 0.25 MG tablet Take 1 tablet by mouth three times a [name] if needed   . amLODipine (NORVASC) 10 MG tablet Take 1 tablet (10 mg total) by mouth daily.   [name] aspirin EC 81 MG tablet Take 81 mg by mouth daily.   [name] azithromycin (ZITHROMAX) 250 MG tablet Take 2 tablets by mouth on [name] 1, followed by 1 tablet by mouth daily for 4 days.   . Blood Glucose Calibration (TAI DOC CONTROL) NORMAL SOLN    . Blood Glucose Monitoring Suppl (TRUE METRIX AIR GLUCOSE METER) w/Device KIT 1 each by Does not apply route 4 (four) times daily. DX CODE E11.8   . Carboxymethylcellulose Sodium (THERATEARS OP) Place 1 drop into both eyes daily as needed  (dry eyes).   . fenofibrate 160 MG tablet Take 1 tablet (160 mg total) by mouth daily.   . ferrous sulfate 325 (65 FE) MG tablet Take 325 mg by mouth daily with breakfast.    . Flaxseed, Linseed, (FLAXSEED OIL PO) Take 1 capsule by mouth 2 (two) times daily.    [name] FLUoxetine (PROZAC) 40 MG capsule Take 1 capsule (40 mg total) by mouth daily.   . fluticasone (FLONASE) 50 MCG/ACT nasal spray SHAKE LIQUID AND USE 2 SPRAYS IN EACH NOSTRIL DAILY   . glucose blood (TRUE METRIX BLOOD GLUCOSE TEST) test strip USE AS DIRECTED 4 TIMES [name]   . HYDROcodone-homatropine (HYCODAN) 5-1.5 MG/5ML syrup Take 5 mLs by mouth every 6 (six) hours as needed.   . Insulin Syringe-Needle U-100 (BD INSULIN SYRINGE U/F) 31G X 5/16" 0.3 ML MISC 10 Units by Does not apply route 3 (three) times daily before meals.   Elmore Guise Devices (LANCING DEVICE) MISC    . levocetirizine (XYZAL) 5 MG tablet TAKE 1 TABLET(5 MG) BY MOUTH EVERY EVENING   . lisinopril (ZESTRIL) 40 MG tablet Take 1 tablet (40 mg total) by mouth daily.   . meloxicam (MOBIC) 7.5 MG tablet Take 1 tablet (7.5 mg total) by mouth daily as needed for pain.   . metoprolol succinate (TOPROL-XL) 50 MG 24 hr tablet TAKE 1 TABLET(50 MG) BY MOUTH DAILY WITH FOOD   . Multiple Vitamin (MULTIVITAMIN WITH MINERALS) TABS Take 1 tablet by mouth daily.   [name]  neomycin-polymyxin-hydrocortisone (CORTISPORIN) 3.5-10000-1 OTIC suspension Place 4 drops into the left ear 3 (three) times daily as needed.   . Omega-3 Fatty Acids (FISH OIL) 1000 MG CAPS Take by mouth.   . rosuvastatin (CRESTOR) 20 MG tablet TAKE 1 TABLET(20 MG) BY MOUTH DAILY   . traMADol (ULTRAM) 50 MG tablet Take 1 tablet (50 mg total) by mouth 3 (three) times daily as needed.   . TRUEplus Lancets 33G MISC USE AS DIRECTED 4 TIMES A [name]   . vitamin E 400 UNIT capsule Take 400 Units by mouth daily.   . [DISCONTINUED] FERRETTS 325 (106 Fe) MG TABS tablet Take 1 tablet by mouth daily.    . [DISCONTINUED] insulin regular  (NOVOLIN R RELION) 100 units/mL injection Inject 0.3 mLs (30 Units total) into the skin daily as needed for high blood sugar (at noon if CBG >200 and will be eating). (Patient taking differently: Inject 15 Units into the skin 2 (two) times daily before a meal. ) 10/14/2020: Changed to Humulin 70/30 by Dr. Chalmers Cater on 10/13/20  . [DISCONTINUED] NOVOLIN N RELION 100 UNIT/ML injection Inject 30-60 Units into the skin 2 (two) times daily. 50 units before breakfast and 50 units before bedtime 10/14/2020: Changed to Humulin 70/30 by Dr. Chalmers Cater on 10/13/20   No facility-administered encounter medications on file as of 10/14/2020.   SDOH Screenings   Alcohol Screen: Not on file  Depression (PHQ2-9): Medium Risk  . PHQ-2 Score: 16  Financial Resource Strain: Medium Risk  . Difficulty of Paying Living Expenses: Somewhat hard  Food Insecurity: No Food Insecurity  . Worried About Charity fundraiser in the Last Year: Never true  . Ran Out of Food in the Last Year: Never true  Housing: Low Risk   . Last Housing Risk Score: 0  Physical Activity: Not on file  Social Connections: Not on file  Stress: Not on file  Tobacco Use: Medium Risk  . Smoking Tobacco Use: Former Smoker  . Smokeless Tobacco Use: Never Used  Transportation Needs: No Transportation Needs  . Lack of Transportation (Medical): No  . Lack of Transportation (Non-Medical): No    Current Diagnosis/Assessment:  Goals Addressed            This Visit's Progress   . Chronic Care Management Pharmacy Care Plan       CARE PLAN ENTRY (see longitudinal plan of care for additional care plan information)  Current Barriers:  . Chronic Disease Management support, education, and care coordination needs related to Hypertension, Hyperlipidemia/Hx of CAD, Diabetes, Tobacco Use Disorder, Depression/Anxiety, Osteoporosis, Neuropathy   Hypertension BP Readings from Last 3 Encounters:  09/23/20 (!) 144/61  08/23/20 (!) 153/83  08/15/20 140/61    . Pharmacist Clinical Goal(s): o Over the next 90 days, patient will work with PharmD and providers to achieve BP goal <130/80 . Current regimen:  . Amlodipine 13m daily . Lisinopril 462mdaily . Metoprolol Succinate 5043maily . Interventions: o Requested patient to check her blood pressure 2-3 times per week and record . Patient self care activities - Over the next 90 days, patient will: o Check BP 2-3 times per week, document, and provide at future appointments o Ensure daily salt intake < 2300 mg/Demi Trieu  Hyperlipidemia Lab Results  Component Value Date/Time   LDLCALC 61 06/15/2020 08:19 AM   LDLDIRECT 100.0 03/10/2020 02:10 PM   . Pharmacist Clinical Goal(s): o Over the next 90 days, patient will work with PharmD and providers to maintain LDL goal <  70 . Current regimen:  . Aspirin 6m daily . Fenofibrate 1688mdaily . Flaxseed Oil 1 capsule twice daily  . Omega 3 fish Oil 1030100mwice daily . Rosuvastatin 7m29mily . Interventions: o Discussed limited efficacy of taking fish oil and flaxseed oil. Will continue this discussion o Discussed that patient should be taking rosuvastatin vs atorvastatin (This was changed June 2021) . Patient self care activities - Over the next 90 days, patient will: o A low fat, low cholesterol is discussed with the patient, and a written copy is given to her.   Diabetes Lab Results  Component Value Date/Time   HGBA1C 10.0 (H) 06/15/2020 08:19 AM   HGBA1C 9.4 (H) 03/10/2020 02:10 PM   . Pharmacist Clinical Goal(s): o Over the next 180 days, patient will work with PharmD and providers to achieve A1c goal <7% . Current regimen:  . Humulin 70/30 60 units twice daily Interventions: o Discussed DM goals - Fasting (before meal blood sugar): 80-130 - Post-Prandial (2 hrs after eating blood sugar): less than 180 - A1c (3 month blood sugar average): less than 7% o Requested patient to check BG twice daily o Collaboration with provider  regarding medication management (transition to basal/bolus regimen) . Patient self care activities - Over the next 90 days, patient will: o Check blood sugar twice daily, document, and provide at future appointments o Contact provider with any episodes of hypoglycemia  Depression . Pharmacist Clinical Goal(s) o Over the next 90 days, patient will work with PharmD and providers to reduce symptoms associated with depression . Current regimen:  . Alprazolam 0.25mg65mee times daily as needed  . Fluoxetine 40mg 70my . Interventions: o Collaboration with provider regarding medication management (consider increase of fluoxetine to 40mg d44m - accepted) . Patient self care activities - Over the next 90 days, patient will: o Maintain depression medication regimen  Osteoporosis . Pharmacist Clinical Goal(s) o Over the next 180 days, patient will work with PharmD and providers to reduce risk of fracture due to osteoporosis  . Current regimen:  o Alendronate 70mg we53m . Interventions: o Consider repeat DEXA Scan . Patient self care activities - Over the next 180 days, patient will: o Consider repeat DEXA Scan  Medication management . Pharmacist Clinical Goal(s): o Over the next 90 days, patient will work with PharmD and providers to maintain optimal medication adherence . Current pharmacy: [facility] switched to [facility] . Interventions o Comprehensive medication review performed. o Utilize [facility] pharmacy for medication synchronization, packaging and delivery . Patient self care activities - Over the next 90 days, patient will: o Focus on medication adherence by filling and taking medications appropriately  o Take medications as prescribed o Report any questions or concerns to PharmD and/or provider(s)  Please see past updates related to this goal by clicking on the "Past Updates" button in the selected goal       Social Hx:  Married to Henry [employer] CCM patient).  Has  5 children and over 40 grand, step-grand, and great grandchildren.  Her children are very important to her and motivate her to get things done. She loves cooking for them.  Her newest great grandchild was born on 8/8/2021Aug 18, 2021her passed away in February2020/03/09ient was made executor of her mother's will. This has caused a lot of turmoil among family members.   Update 09/13/20 Went to the beach for Thanksgiving with her family.  Hypertension   BP goal is:  <130/80 (per Cardio)  Office blood pressures are  BP Readings from Last 3 Encounters:  09/23/20 (!) 144/61  08/23/20 (!) 153/83  08/15/20 140/61   Patient checks BP at home infrequently (last time was 2-3 weeks ago) Patient home BP readings are ranging: Unable to assess  Patient has failed these meds in the past: None noted  Patient is currently uncontrolled on the following medications:  . Amlodipine 75m daily ([facility] . Lisinopril 460mdaily (last filled 07/29/20 35 DS at [facility]) . Metoprolol Succinate 5076maily  Patient Reported Adherence States she is usually good about taking these. Will miss about once per week  We discussed BP goal   Update 09/13/20 Checking BP? No, "so much going on in my life" Low battery on meter Need Lisinopril? Yes in 10 days Denies headaches, chest pains, or dizziness  She mentions she would like to get medication packaged, but she got confused.   Qty Remaining Lisinopril - #10 Metoprolol - #10 Amlodipine - #118  Pattern of non-adherence although patient states mail order over supplies her with medication.  Update 10/15/19 BP at Dr. BalAlmetta Lovelyfice yesterday 124/48  Plan -Check blood pressure 2-3 times per week and record -Continue current medications     Diabetes   Follows with Dr. BalChalmers Caterndo) about every 4 months  A1c goal <7%  FBG goal: 80-130 PPBG goal: <180  Recent Relevant Labs: Lab Results  Component Value Date/Time   HGBA1C  10.0 (H) 06/15/2020 08:19 AM   HGBA1C 9.4 (H) 03/10/2020 02:10 PM   GFR 53.11 (L) 03/10/2020 02:10 PM   GFR 72.29 07/28/2019 02:50 PM   MICROALBUR 27.3 (H) 07/28/2019 02:50 PM   MICROALBUR 1.7 06/23/2015 10:21 AM    Last diabetic Eye exam: (noted April 2021) Lab Results  Component Value Date/Time   HMDIABEYEEXA Cataracts 07/08/2013 12:00 AM    Last diabetic Foot exam: No results found for: HMDIABFOOTEX (noted on 03/10/20 visit)  Checking BG: 5-6 times per week  Recent FBG Readings: Unable to assess Recent 2hr PP BG readings:  Unable to assess Recent HS BG readings: 325 last night (reports she had a busy Lucero Ide and missed all of her medicine yesterday)  Patient has failed these meds in past: None noted  Patient is currently uncontrolled on the following medications: . Humulin 70/30 mix 60 units twice daily . Jardiance 33m19mily  . Januvia 100mg63mly  What happened to Lantus? Doesn't recall taking Humalog? Would get in the donut whole in August, so she would stop taking it.  Wakes up in the morning with low blood sugar taking Novolin N as prescribed, so she takes 50 units at each dose.  Does not use R daily. Shes 15-20 units of R. Might only give 15 units at night. She  "I am really afraid of the R" Reports one time her BG was down to 29 when taking 25 units. States she is most comfortable with a dose of Novolin R 15 units per dose.  [facility] Adherence Novolin N - 4/7 days takes both doses Novolin R - 5/7 days takes at least one dose  Reports she takes R at bedtime. Discussed that R should be taken with meals and N should be taken at bedtime. This could be a contributing factor to her nocturnal hypoglycemia. Last hypoglycemic episode was about 3 weeks ago. Woke up in the middle of the night.   We discussed:  DM goals (a1c <7%, FBG 80-130, PPBG <180) , We discussed that Jardiance would have to be prescribed by Dr. Chalmers Cater, but  that we would work on transitioning her insulin regimen before adding another oral medication.  Diet Eats twice daily B - eggs, sausage, sometimes grits, ham L - skips, but if she does eat mid Jayleene Glaeser it is a usually a burger ([employer], [employer], [place]) D - Meat and a green vegetable and a starch most days Snacks - Bag of chips about twice a week Drinks - Lately drinking a lot of water, or diet pepsi or coke, occassionally juice and regular soda  Exercise Nothing outside of running errands. Planned to start a walking club, but her knees give her pain  Update 09/13/20 Qty Remaining  Jardiance - #31 189 last night before dinner Tries to check at least once a Tesla [place] Highest number last week 329 Lowest number last week 189 BG has not gone done much since cortisone injection. Feels the injection caused yeast infection. Would like script for fluconazole** R = 15 units AM and 15 units PM (rarely uses 20 units); does twice daily about 5 days a week  N = 50 units BID  Insulin conversion guidance NPH or long-acting to ultra-long acting Tyler Aas) Convert total daily dose unit-per-unit and give once daily,19 or consider a 20% dose reduction. Do not increase the dose more often than every 3 to 4 days.  NPH 100 units daily = Tresiba 100 units daily 20% dose reduction = Tresiba 80 units daily (due to concern for possible non-adherence)  Regular to Rapid-Acting (Novolog) Convert unit-per-unit. Rapid-acting insulin analogs have a faster onset of action and a shorter duration of action than human regular insulin.  R 15 units twice daily = Novolog 15 units twice daily  Plan -Complete PAP paperwork -Check blood sugar fasting each morning and 2 hour post prandial -Consistently take Novolin N 50 units twice daily before breakfast and before bedtime -Consistently take Novolin R 15 units before breakfast and before dinner -Plan to transition to a basal/bolus regimen for more predictable  pharmacodynamics of a long-acting and short-acting insulin vs Novolin N and Novolin R. Tyler Aas 80 units once daily plus Novolog 15 units twice daily pending Dr. Chalmers Cater approval)  Update 10/14/20 A1c 9.2% per patient from Dr. Almetta Lovely office Dr. Chalmers Cater changed regimen? Yes, Humulin 70/30 mix 60 units twice daily Decision not to proceed with basal bolus Januvia? Yes Jardiance? Yes BG this AM is 212 She mentions being very nervous about having lows.  Would still recommend a basal/bolus regimen over insulin mix, but will defer to endo. Admits polyphagia, polydipsia  Future -Continue current medications  Future Plan -Consider basal/bolus regimen for tighter control  Hyperlipidemia/Hx of CAD   LDL goal <70  Lipid Panel     Component Value Date/Time   CHOL 131 06/15/2020 0819   TRIG 344 (H) 06/15/2020 0819   HDL 29 (L) 06/15/2020 0819   LDLCALC 61 06/15/2020 0819   LDLDIRECT 100.0 03/10/2020 1410    Hepatic Function Latest Ref Rng & Units 09/23/2020 06/15/2020 03/10/2020  Total Protein 6.5 - 8.1 g/dL 7.5 6.9 6.7  Albumin 3.5 - 5.0 g/dL 4.5 - 4.4  AST 15 - 41 U/L '21 18 18  ' ALT 0 - 44 U/L '22 17 18  ' Alk Phosphatase 38 - 126 U/L 43 - 39  Total Bilirubin 0.3 - 1.2 mg/dL 0.6 0.4 0.7  Bilirubin, Direct 0.0 - 0.3 mg/dL - - -  The 10-year ASCVD risk score Mikey Bussing DC Brooke Bonito., et al., 2013) is: 17.6%   Values used to calculate the score:     Age: 72 years     Sex: Female     Is Non-Hispanic African American: Yes     Diabetic: Yes     Tobacco smoker: No     Systolic Blood Pressure: 891 mmHg     Is BP treated: Yes     HDL Cholesterol: 29 mg/dL     Total Cholesterol: 131 mg/dL   Patient has failed these meds in past: atorvastatin (inefficacy?), lovaza (listed in D/C meds. No apparent reason for D/C) Patient is currently controlled for LDL, uncontrolled for TRIG and HDL on the following medications:  . Aspirin 80m daily . Fenofibrate 1627mdaily . Flaxseed Oil 100088m capsule twice daily   . Omega 3 fish Oil 1030m17mice daily . Rosuvastatin 20mg17mly   Patient states she got confused with atorvastatin and rosuvastatin.  She denies taking both atorvastatin and rosuvastatin at same time, but would alternate between the two. She states she has been taking the rosuvastatin and fenofibrate. Encouraged patient to discard atorvastatin and switch to rosuvastatin.   We discussed:  How flaxseed oil and omega 3 oil may not be helpful for patient. Noting her elevated lipid panel will reassess this upon next lipid panel with change to rosuvastatin   Update 09/13/20 LDL improved since change to rosuvastatin.  Qty Remaining Rosuvastatin - #10  Will need refill of rosuvastatin in 10 days  Update 10/14/20 Tolerating regimen  Plan -Continue current medication  Depression/Anxiety   [facility] 09/13/2020 06/07/2020  Decreased Interest '3 3 2  ' Down, Depressed, Hopeless '3 3 1  ' PHQ - 2 Score '6 6 3  ' Altered sleeping '1 1 1  ' Tired, decreased energy '3 3 1  ' Change in appetite 2 0 1  Feeling bad or failure about yourself  0 3 0  Trouble concentrating 1 0 1  Moving slowly or fidgety/restless 3 3 0  Suicidal thoughts 0 0 0  PHQ-9 Score '16 16 7  ' Difficult doing work/chores - Very difficult Very difficult  Some recent data might be hidden    Patient has failed these meds in past: sertraline (palpitations) Patient is currently uncontrolled on the following medications:  . Alprazolam 0.25mg 35me times daily as needed (takes about 2-3 times per week or less) . Fluoxetine 40mg d70m   Last week was a bad week for her and she took xanax twice. She is dealing with family disagreements as she is the executor of her mother's will.  2020 was a rough year for her. She lost her mother, nephew, brother in law, 3 friends, and 2 grandsons. Would like to give this more time before making change  We discussed:  Option of medication titration, but she would like to wait for  now. Encouraged patient to reach out if further help is needed. She verbalizes understanding.  Update 09/13/20 Qty Remaining Fluoxetine ~90 She now feels she may benefit from updated dose of fluoxetine noting her PHQ 9 of 16 today.  Will coordinate with PCP to increase dose if agreeable.   Update 10/14/20 She states sometimes doesn't feel quite right inside, but she thinks it's associated with [place]ard[place] current medications as further assessment of PHQ9  Osteoporosis   Last DEXA Scan: 05/29/2017  T-Score femoral neck: -2.0  T-Score forearm radius: -2.5  No results found for: VD25OH   Patient  has failed these meds in past: None noted  Patient is currently controlled on the following medications:  . Alendronate 38m weekly (Sunday)  Update 09/13/20 Admits that she forgets to take this medication. States she has plenty of this medication; #16 remaining  Update 10/14/20 Still would benefit from updated DEXA Scan  Plan -Complete DEXA Scan -Continue current medications   Medication Management   [facility] Uses pill box? Yes Pt endorses 80% compliance  Problem Story She feels mail order pharmacy sends her more medication than she needs, so she would like to stop using mail order.  Discussed how [facility] could help coordinate her medications noting her husband is also receiving his medications from [facility] and she would like to transition her medications to [facility]  Update 09/13/20 Admits she forgets her HS meds (rosuvastatin, fenofibrate, fish oil, flaxseed) Created sync plan with the listed Rayvn Rickerson supplies remaining of each medication  Update 10/14/20 Set to received updated script from Dr. BAlmetta Lovelyoffice including JCelesta GentileJardiance Humulin 70/30 Pen Needles   Follow up:  3 month phone visit  Miscellaneous Meds Benzonatate 1025mPRN about twice a daily Theratears Fluticasone  5013mMultivitamin Cortisporin Vitamin E 400 units daily (Was told this would help with her breast pain and it has been helpful for her. Discussed risk/benefit and feel this specific patient has greater benefit than risk noting it is on the lower end (<1000 units/Ivelis Norgard))   KanDe BlanchharmD, BCACP Clinical Pharmacist [phone]

## 2020-10-18 ENCOUNTER — Other Ambulatory Visit: Payer: Self-pay

## 2020-10-18 ENCOUNTER — Encounter: Payer: Self-pay | Admitting: Orthopaedic Surgery

## 2020-10-18 ENCOUNTER — Ambulatory Visit (INDEPENDENT_AMBULATORY_CARE_PROVIDER_SITE_OTHER): Payer: Worker's Compensation | Admitting: Orthopaedic Surgery

## 2020-10-18 DIAGNOSIS — M25511 Pain in right shoulder: Secondary | ICD-10-CM | POA: Diagnosis not present

## 2020-10-18 NOTE — Progress Notes (Signed)
Office Visit Note   Patient: [name]           Date of Birth: 01/16/1946           MRN: [id number] Visit Date: 10/18/2020              Requested by: [address] [place], [place] [address] [place],  [place] [address] PCP: [name], [name], DO   Assessment & Plan: Visit Diagnoses:  1. Acute pain of right shoulder     Plan: Impression is chronic right shoulder pain suspect rotator cuff pathology. We will need to obtain MRI at this point to evaluate for structural abnormalities. Follow-up after the MRI. Continue with sedentary work for now.  Follow-Up Instructions: Return if symptoms worsen or fail to improve.   Orders:  Orders Placed This Encounter  Procedures  . MR SHOULDER RIGHT WO CONTRAST   No orders of the defined types were placed in this encounter.     Procedures: No procedures performed   Clinical Data: No additional findings.   Subjective: Chief Complaint  Patient presents with  . Right Shoulder - Pain    Ica returns today for continued right shoulder pain. Subacromial injection as well as physical therapy have not helped. This is a workers comp injury. She continues to have pain in the elbow that radiates to the lateral side of her shoulder that is worse with raising of her arm. Denies any numbness and tingling.   Review of Systems   Objective: Vital Signs: There were no vitals taken for this visit.  Physical Exam  Ortho Exam Right shoulder shows normal range of motion with mild pain at the extremes. Manual muscle testing supraspinatus infraspinatus shows moderate pain and moderate weakness. Specialty Comments:  No specialty comments available.  Imaging: No results found.   PMFS History: Patient Active Problem List   Diagnosis Date Noted  . Acute pain of right shoulder 08/18/2020  . Primary osteoarthritis of right knee 08/18/2020  . Impacted cerumen of right ear 05/24/2020  . Bilateral impacted cerumen  05/17/2020  . Hyperlipidemia associated with type 2 diabetes mellitus (HCC) 03/10/2020  . Cough 03/10/2020  . Seasonal allergies 03/10/2020  . Elevated rheumatoid factor 07/21/2018  . Gastroenteritis 07/21/2018  . Trigger middle finger of left hand 12/19/2017  . Ganglion cyst of dorsum of right wrist 12/19/2017  . Morton's neuroma of right foot 12/19/2017  . Preventative health care 12/13/2017  . Pain in both hands 12/13/2017  . Hyperlipidemia LDL goal <100 12/13/2017  . Abscess of upper gum 12/13/2017  . Spinal stenosis of cervical region 09/25/2016  . Cervical radiculopathy 09/06/2016  . Dyspnea 02/18/2016  . Chest pain 02/18/2016  . HLD (hyperlipidemia) 02/18/2016  . CAD in native artery 02/18/2016  . RLS (restless legs syndrome) 02/18/2016  . Pancreatitis 02/18/2016  . SOB (shortness of breath) 02/18/2016  . Uncontrolled type 2 diabetes mellitus with complication (HCC)   . Tobacco abuse   . Osteoarthritis of right knee 07/05/2015  . Smoking 07/02/2013  . Acute upper respiratory infections of unspecified site 07/02/2013  . Flu vaccine need 07/02/2013  . Multinodular goiter 04/16/2013  . Bunion 07/07/2012  . BACK PAIN, THORACIC REGION, RIGHT 05/24/2009  . IRON DEFICIENCY 03/25/2009  . GERD 03/25/2009  . WEIGHT LOSS 03/25/2009  . SINUSITIS- ACUTE-NOS 11/25/2008  . Hyperlipidemia LDL goal <70 10/07/2008  . ACUTE PANCREATITIS 09/07/2008  . OVARIAN CYST 09/07/2008  . KNEE PAIN, RIGHT 05/24/2008  .  CARPAL TUNNEL SYNDROME, RIGHT 05/12/2008  . Acute pain of both shoulders 04/02/2008  . ROTATOR CUFF REPAIR, RIGHT, HX OF 03/29/2008  . GOITER, MULTINODULAR 12/26/2007  . Depression 07/07/2007  . HIP PAIN, RIGHT 03/11/2007  . RESTLESS LEG SYNDROME, HX OF 03/11/2007  . Diabetes mellitus type II, uncontrolled (HCC) 03/07/2007  . Essential hypertension 03/07/2007   Past Medical History:  Diagnosis Date  . Blockage of coronary artery of heart (HCC)   . Depression   . Diabetes  mellitus   . Hyperlipidemia   . Hypertension   . Pancreatitis   . Restless leg syndrome     Family History  Problem Relation Age of Onset  . Heart attack Father   . Heart attack Brother   . Diabetes Sister   . Cirrhosis Sister   . Hypertension Sister   . Cancer Mother   . Hypertension Mother   . Vision loss Mother   . Cancer Sister   . Sleep apnea Sister   . Heart Problems Sister   . Diabetes Sister   . Gout Sister   . Diabetes Son   . Heart attack Son   . Diabetes Daughter   . Goiter Daughter   . Diabetes Daughter     Past Surgical History:  Procedure Laterality Date  . CATARACT EXTRACTION, BILATERAL    . CORONARY ANGIOPLASTY WITH STENT PLACEMENT    . FOOT SURGERY Right    cyst removal  . ROTATOR CUFF REPAIR     right  . ROTATOR CUFF REPAIR Left   . TUBAL LIGATION     Social History   Occupational History  . Not on file  Tobacco Use  . Smoking status: Former Smoker    Packs/day: 0.20    Types: Cigarettes    Quit date: 05/05/2020    Years since quitting: 0.4  . Smokeless tobacco: Never Used  Vaping Use  . Vaping Use: Never used  Substance and Sexual Activity  . Alcohol use: No  . Drug use: No  . Sexual activity: Not on file

## 2020-10-27 ENCOUNTER — Telehealth: Payer: Self-pay | Admitting: Orthopaedic Surgery

## 2020-10-27 NOTE — Telephone Encounter (Signed)
Pt called stating she is having pain all over her body and her tramadol isn't helping so she would like to have something else called in please; pt would like to be updated on what DR.Roda Shutters decides to send.   [phone]

## 2020-10-27 NOTE — Telephone Encounter (Signed)
Please advise 

## 2020-10-28 ENCOUNTER — Other Ambulatory Visit: Payer: Self-pay | Admitting: Physician Assistant

## 2020-10-28 MED ORDER — HYDROCODONE-ACETAMINOPHEN 5-325 MG PO TABS: 1.0000 | ORAL_TABLET | Freq: Every day | ORAL | 0 refills | Status: DC | PRN

## 2020-10-28 NOTE — Telephone Encounter (Signed)
Looks like we are waiting on mri of the shoulder?  I have called in one small rx for norco, but if she continues to need a narcotic, she will need to get from pcp or we can refer to pain management as we cannot write long term narcotics.  Also, probably  best to reach out to pcp if entire body is in pain

## 2020-10-28 NOTE — Telephone Encounter (Signed)
CALLED PT NO ANSWER LMOM

## 2020-10-31 ENCOUNTER — Telehealth: Payer: Self-pay | Admitting: Pharmacist

## 2020-10-31 NOTE — Progress Notes (Addendum)
Chronic Care Management Pharmacy Assistant   Name: [name]  MRN: [id number] DOB: [birth year]  Reason for Encounter: Medication Review   PCP : Ann Held, DO  Allergies:   Allergies  Allergen Reactions   Levemir [Insulin Detemir] Hives and Swelling   Metformin And Related Other (See Comments)    Sick on stomach, sweaty, throw up, pain    Medications: Outpatient Encounter Medications as of 10/31/2020  Medication Sig   Alcohol Swabs (B-D SINGLE USE SWABS REGULAR) PADS USE AS DIRECTED   alendronate (FOSAMAX) 70 MG tablet Take with a full glass of water on an empty stomach.   ALPRAZolam (XANAX) 0.25 MG tablet Take 1 tablet by mouth three times a day if needed   amLODipine (NORVASC) 10 MG tablet Take 1 tablet (10 mg total) by mouth daily.   aspirin EC 81 MG tablet Take 81 mg by mouth daily.   azithromycin (ZITHROMAX) 250 MG tablet Take 2 tablets by mouth on day 1, followed by 1 tablet by mouth daily for 4 days.   Blood Glucose Calibration (TAI DOC CONTROL) NORMAL SOLN    Blood Glucose Monitoring Suppl (TRUE METRIX AIR GLUCOSE METER) w/Device KIT 1 each by Does not apply route 4 (four) times daily. 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   fenofibrate 160 MG tablet Take 1 tablet (160 mg total) by mouth daily.   ferrous sulfate 325 (65 FE) MG tablet Take 325 mg by mouth daily with breakfast.    Flaxseed, Linseed, (FLAXSEED OIL PO) Take 1 capsule by mouth 2 (two) times daily.    FLUoxetine (PROZAC) 40 MG capsule Take 1 capsule (40 mg total) by mouth daily.   fluticasone (FLONASE) 50 MCG/ACT nasal spray SHAKE LIQUID AND USE 2 SPRAYS IN EACH NOSTRIL DAILY   glucose blood (TRUE METRIX BLOOD GLUCOSE TEST) test strip USE AS DIRECTED 4 TIMES A DAY   HYDROcodone-acetaminophen (NORCO) 5-325 MG tablet Take 1-2 tablets by mouth daily as needed.   HYDROcodone-homatropine (HYCODAN) 5-1.5 MG/5ML syrup  Take 5 mLs by mouth every 6 (six) hours as needed.   Insulin Syringe-Needle U-100 (BD INSULIN SYRINGE U/F) 31G X 5/16" 0.3 ML MISC 10 Units by Does not apply route 3 (three) times daily before meals.   Lancet Devices (LANCING DEVICE) MISC    levocetirizine (XYZAL) 5 MG tablet TAKE 1 TABLET(5 MG) BY MOUTH EVERY EVENING   lisinopril (ZESTRIL) 40 MG tablet Take 1 tablet (40 mg total) by mouth daily.   meloxicam (MOBIC) 7.5 MG tablet Take 1 tablet (7.5 mg total) by mouth daily as needed for pain.   metoprolol succinate (TOPROL-XL) 50 MG 24 hr tablet TAKE 1 TABLET(50 MG) BY MOUTH DAILY WITH FOOD   Multiple Vitamin (MULTIVITAMIN WITH MINERALS) TABS Take 1 tablet by mouth daily.   neomycin-polymyxin-hydrocortisone (CORTISPORIN) 3.5-10000-1 OTIC suspension Place 4 drops into the left ear 3 (three) times daily as needed.   Omega-3 Fatty Acids (FISH OIL) 1000 MG CAPS Take by mouth.   rosuvastatin (CRESTOR) 20 MG tablet TAKE 1 TABLET(20 MG) BY MOUTH DAILY   traMADol (ULTRAM) 50 MG tablet Take 1 tablet (50 mg total) by mouth 3 (three) times daily as needed.   TRUEplus Lancets 33G MISC USE AS DIRECTED 4 TIMES A DAY   vitamin E 400 UNIT capsule Take 400 Units by mouth daily.   No facility-administered encounter medications on file as of 10/31/2020.  Current Diagnosis: Patient Active Problem List   Diagnosis Date Noted   Acute pain of right shoulder 08/18/2020   Primary osteoarthritis of right knee 08/18/2020   Impacted cerumen of right ear 05/24/2020   Bilateral impacted cerumen 05/17/2020   Hyperlipidemia associated with type 2 diabetes mellitus ([place]) 03/10/2020   Cough 03/10/2020   Seasonal allergies 03/10/2020   Elevated rheumatoid factor 07/21/2018   Gastroenteritis 07/21/2018   Trigger middle finger of left hand 12/19/2017   Ganglion cyst of dorsum of right wrist 12/19/2017   Morton's neuroma of right foot 12/19/2017   Preventative health care 12/13/2017   Pain in both hands 12/13/2017    Hyperlipidemia LDL goal <100 12/13/2017   Abscess of upper gum 12/13/2017   Spinal stenosis of cervical region 09/25/2016   Cervical radiculopathy 09/06/2016   Dyspnea 02/18/2016   Chest pain 02/18/2016   HLD (hyperlipidemia) 02/18/2016   CAD in native artery 02/18/2016   RLS (restless legs syndrome) 02/18/2016   Pancreatitis 02/18/2016   SOB (shortness of breath) 02/18/2016   Uncontrolled type 2 diabetes mellitus with complication ([place])    Tobacco abuse    Osteoarthritis of right knee 07/05/2015   Smoking 07/02/2013   Acute upper respiratory infections of unspecified site 07/02/2013   Flu vaccine need 07/02/2013   Multinodular goiter 04/16/2013   Bunion 07/07/2012   BACK PAIN, THORACIC REGION, RIGHT 05/24/2009   IRON DEFICIENCY 03/25/2009   GERD 03/25/2009   WEIGHT LOSS 03/25/2009   SINUSITIS- ACUTE-NOS 11/25/2008   Hyperlipidemia LDL goal <70 10/07/2008   ACUTE PANCREATITIS 09/07/2008   OVARIAN CYST 09/07/2008   KNEE PAIN, RIGHT 05/24/2008   CARPAL TUNNEL SYNDROME, RIGHT 05/12/2008   Acute pain of both shoulders 04/02/2008   ROTATOR CUFF REPAIR, RIGHT, HX OF 03/29/2008   GOITER, MULTINODULAR 12/26/2007   Depression 07/07/2007   HIP PAIN, RIGHT 03/11/2007   RESTLESS LEG SYNDROME, HX OF 03/11/2007   Diabetes mellitus type II, uncontrolled ([place]) 03/07/2007   Essential hypertension 03/07/2007    Goals Addressed   None    Reviewed chart for medication changes ahead of medication coordination call.  Consults: 10-18-2020 (Ortho) Patient presented in the office c/o right shoulder pain. Subacromial injection as well as physical therapy have not helped Pain is described as starting in the elbow and radiating to the shoulder and is worse with raising of her arm. MRI of the shoulder was ordered.  No OVs or hospital visits since last Pharmacist visit.   No medication changes indicated.  BP Readings from Last 3 Encounters:  09/23/20 (!) 144/61  08/23/20 (!) 153/83  08/15/20  140/61    Lab Results  Component Value Date   HGBA1C 10.0 (H) 06/15/2020     Patient obtains medications through [facility]  90 Days   Called patient and reviewed medications and coordinated delivery.  Coordinated acute fill for Hydrocodone 5-325 mg; one to two tabs daily as needed for pain.  Confirmed delivery date of 10-31-2020, advised patient that pharmacy will contact them the morning of delivery.  Follow-Up:  Coordination of Enhanced Pharmacy Services and Pharmacist Review   Fanny Skates, [place] Pharmacist Assistant [phone]  4 minutes spent in review, coordination, and documentation.  Reviewed by: Beverly Milch, PharmD Clinical Pharmacist [facility] [phone]

## 2020-11-06 ENCOUNTER — Other Ambulatory Visit: Payer: Medicare HMO

## 2020-11-16 ENCOUNTER — Telehealth: Payer: Self-pay

## 2020-11-16 DIAGNOSIS — Z006 Encounter for examination for normal comparison and control in clinical research program: Secondary | ICD-10-CM

## 2020-11-16 NOTE — Telephone Encounter (Addendum)
Subject was called for 6 month telephone visit for the COORDINATE Diabetes trial. No AE/SAE to report. No medication changes; remains on ACE/ARB/Beta blocker and statin. Has not been prescribed an SGLT2 or GLP1. Will complete next call in 3 months.    COORDINATE-Diabetes 6 and 12 Month Patient Questionnaire                     Site #:    [id number]      Patient ID:   6 and 12 Month Patient Questionnaire  Visit Date: 11/16/2020    Site tracking only:  '[x]'   6 month   '[]'  12 month  Vital Status '[x]'  Patient Alive  > Proceed to Visit Status   '[]'  Patient Dead >  Complete Death Form only  '[]'  Unknown  > Proceed to Visit Status   Visit Status Was the interview completed?   '[]'  No  ? IF NO, Select reason why  '[]'   Unable to locate  '[]'   No Valid Contacts (patients or        alternates)  '[]'   Multiple attempts to valid contacts '[]'  Patient no longer cared for at study       clinic  '[]'   Patient withdrew  '[]'   Other, specify: __________________                                                                                                                                                                                                                                                                  IF NO, Last date of contact: ___/___/_____                                                MM DD YYYY   '[x]'   Yes ? IF YES, Select source of Interview:             '[]'   Proxy             '[x]'   Patient     COORDINATE-Diabetes 6 Month CASE REPORT FORM (Intervention) -EHR Site #:   [id number]  Patient ID:           [id number]   6 MONTH EHR REVIEW  Medical Record Check Date 11/16/2020   Vital Status '[x]' Patient Alive >Date last known alive per EHR:   '[]' Patient Dead >> Complete Death Form  '[]' Unknown   CLINICAL EVENTS / PROCEDURES  Hospitalization since last visit? (>=24 hour stay) '[x]' No '[]' Yes  >> if yes, Complete the following  Date of hospital admission:        / /             MM DD YYYY  Primary  discharge diagnosis:  *Complete appropriate event validation form '[]' acute myocardial infarction (heart attack)* '[]' stroke* '[]' heart failure* '[]' coronary revascularization* '[]' peripheral revascularization* '[]' cerebral revascularization* '[]' diabetes (e.g. hypoglycemia, DKA) '[]' renal failure '[]' amputation '[]' other cardiovascular reason '[]' other NON-cardiovascular reason '[]' unknown  Other diagnoses not documented above: (check all that apply)  *Complete appropriate event validation form '[]' acute myocardial infarction (heart attack)* '[]' stroke* '[]' heart failure* '[]' coronary revascularization* '[]' peripheral revascularization* '[]' cerebral revascularization* '[]' diabetes (e.g. hypoglycemia, DKA) '[]' renal failure '[]' amputation '[]' other cardiovascular reason '[]' other NON-cardiovascular reason '[]' unknown  Were any of the following outpatient procedures done since the last visit? (I.e. procedures not captured above)  Coronary revascularization '[x]' No '[]' Yes >IF YES, Date / /             MM DD YYYY  Peripheral revascularization '[x]' No '[]' Yes > IF YES, Date / /             MM DD YYYY  Cerebral revascularization '[x]' No '[]' Yes > IF YES, Date / /             MM DD YYYY  Extremity amputation    '[x]' No '[]' Yes >IF YES, Date / /             MM      DD       YYYY  Renal replacement therapy (i.e. dialysis)    '[x]' No '[]' Yes > IF YES, Date of initiation / /             MM      DD       YYYY   **EDC will allow for collection of multiple hospitalizations and procedures   MEDICATIONS  Medication Currently Prescribed? If started since last visit: If not started since last visit: If stopped since last visit:  Cardiac Medications  ACE Inhibitor / Angiotensin Receptor Blocker (ARB) / Angiotensin Receptor Neprilysin inhibitor (ARNi)  '[]' No >  '[x]' Yes > Since last visit, medication was: '[]' Stopped '[]' Not started '[]' Started '[x]' Continued same medication '[]' Continued with medication changes Date started:        / /             MM DD  YYYY Who prescribed? '[]' Cardiology provider  '[]' Study clinic '[]' Outside clinic '[]' Endocrinology provider '[]' Primary care provider '[]' Other provider  Specify:                             '[]' Unknown Reason (check all that apply): '[]' History of swelling around lips, eyes or face '[]' Feeling dizzy/lightheaded '[]' Low blood pressure '[]' Poor or fluctuating kidney function '[]' High potassium '[]' Patient has experienced other side effects to this medication  before '[]' Patient will be unable to adhere/monitor '[]' Patient unable to afford it '[]' Patient does not want to      take this medication '[]' Pregnancy '[]' Other (specify: ) '[]' Unknown Reason Date discontinued:        / /             MM DD  YYYY Reason (check all that apply): '[]' Swelling around lips, eyes       or face '[]' Feeling dizzy/lightheaded '[]' Low blood pressure '[]' Poor or fluctuating kidney function '[]' High potassium '[]' Other medication side       effects '[]' Patient unable to        adhere/monitor '[]' Had an       operation/procedure that        required stopping it '[]' Patient unable to afford it '[]' Patient no longer wants       to take this medication '[]' Pregnancy '[]' Other (specify: ) '[]' Unknown Reason   If started or changed  Medication Name: '[]' Benazepril (Lotensin) '[]' Captopril (Capoten) '[]' Enalapril (Vasotec) '[]' Fosinopril (Monopril) '[]' Lisinopril (Zestril, Prinivil) '[]' Quinapril (Accupril) '[]' Ramipril (Altace) '[]' Azilsartan (Edarbi) '[]' Candesartan (Atacand) '[]' Irbesartan (Avapro) '[]' Losartan (Cozaar) '[]' Olmesartan (Benicar) '[]' Telmisartan (Micardis) '[]' Valsartan (Diovan) '[]' Sacrubitril/Valsartan (Entresto)     Beta Blocker '[]' No '[x]'  Yes > '[]' Acebutolol (Sectral) '[]' Bisoprolol (Zebeta) '[]' Carvedilol (Coreg) '[]' Labetalol (Trandate,      Normodyne) '[x]' Metoprolol succinate (Toprol) '[]' Metoprolol tartrate       (Lopressor) '[]' Nadolol (Corgard) '[]' Nebivolol (Bystolic) '[]' Propranolol (Inderal) '[]' Sotalol (Betapace)      Medication Currently Prescribed? If started since last visit: If not started since last visit: If stopped since last visit:  Aldosterone Antagonist '[x]' No '[]' Yes > '[]' Amiloride '[]' Eplerenone (Inspra) '[]' Spirinolactone (Aldactone) '[]' Traimterene (Dyrenium)   Calcium Channel Blocker '[]' No '[x]'  Yes > Medication Name: '[x]' Amlodipine (Norvasc) '[]' Diltiazem (Cardizem) '[]' Felodipine (Plendil) '[]' Nifedipine (Procardia) '[]' Verapamil (Calan)   Diuretic Loop '[x]' No '[]'  Yes > Medication Name: '[]' Bumetanide (Bumex) '[]' Ethacrynic acid (Edecrin) '[]' Furosemide (Lasix) '[]' Torsemide (Demadex)   Diuretic Thiazide- type '[x]' No '[]'  Yes > Medication Name: '[]' Chlorothiazide      '[]' Chlorthalidone '[]' Hydrochlorothiazide '[]' Indapamide '[]' Metolazone   Anticoagulation Therapy (other than Warfarin) '[x]' No '[]'  Yes > Medication Name: '[]' Apixaban (Eliquis) '[]' Edoxaban (Lixiana) '[]' Rivaroxaban (Xeralto) '[]' Dabigatran (Redaxa)   Warfarin '[x]' No '[]'  Yes    Antiplatelet Agent (including aspirin) '[]' No '[x]'  Yes > Medication Name (check all that apply): '[x]' Aspirin '[]' Clopidogrel (Plavix) '[]' Prasugrel (Effient) '[]' Ticagrelor (Brilinta) '[]' Ticlopidine (Ticlid) '[]' Dipyridamole (Persantine)    Medication Currently Prescribed? If started since last visit: If not started since last visit: If stopped since last visit:  Statin  '[]' No >  '[x]' Yes > Since last visit, medication was: '[]' Stopped '[]' Not started '[]' Started '[x]' Continued same medication and dose '[]' Continued with dose or medication changes Date started:        / /             MM DD YYYY Who prescribed? '[]' Cardiology provider '[]' Endocrinology provider '[]' Primary care provider '[]' Other provider  Specify:                             '[]' Unknown Reason (check all that apply): '[]' History of Rhabdomyolysis '[]' LDL-cholesterol already       <70 '[]' Muscle      aches/pain/weakness '[]' Mental       fogginess/memory loss '[]' Liver dysfunction '[]' Patient has  experienced other side    effects to this medication before '[]' Patient will be unable to adhere/monitor '[]' Patient unable to afford it '[]' Patient does not want to take this medication '[]' Pregnancy '[]' Other (specify: ) '[]' Unknown Reason Date discontinued:        / /             MM DD YYYY Reason (check all that apply): '[]' Rhabdomyolysis '[]' Muscle aches/pain/weakness '[]' Mental fogginess/memory loss '[]' Liver dysfunction '[]' Other medication side effects '[]' Patient unable to          adhere/monitor '[]' Patient unable to afford it '[]' Patient no longer wants to take       this medication '[]'   Pregnancy '[]' Other (specify: ) '[]' Unknown Reason   If started or changed  Medication Name: '[]' Atorvastatin (Lipitor) '[]' Fluvastatin (Lescol) '[]' Lovastatin (Mevacor) '[]' Pravastatin (Pravachol) '[x]' Rosuvastatin (Crestor) '[]' Simvastatin (Zocor) '[]' Pitatavastatin (Livalo)  Dose: '[]' 1 mg '[]' 10 mg '[]' 2 mg '[x]'  20 mg '[]' 3 mg '[]'  40 mg '[]' 4 mg '[]'  60 mg '[]' 5 mg '[]'  80 mg  Frequency: '[x]' Daily  '[]' Less than daily      Does the patient have statin intolerance that prevents the use of maximum dose of high potency statin? '[x]' No '[]' Yes >IF YES, Complete Statin Intolerance form   Non-statin lipid lowering therapy '[]' No '[x]' Yes > Medication Name (check all that apply): '[]' Colesevelam (Welchol) '[]' Ezetimibe (Zetia) '[x]' Fibrate '[]' Niacin '[]' PCSK9 inhibitor '[]' Omega 3 acid ethyl esters (Lovaza) '[]' Icosapent Ethyl (Vascepa) '[x]' Over the counter omega 3 fatty acid or fish oil supplement     Medication Currently Prescribed? If started since last visit: If not started since last visit: If stopped since last visit:  Diabetes Medications  SGLT2 Inhibitor  '[x]' No >  '[]' Yes > Since last visit, medication was: '[]' Stopped '[]' Not started '[]' Started '[]' Continued same medication '[]' Continued with medication changes Date started:        / /             MM DD YYYY Who prescribed? '[]' Cardiology provider  '[]' Study clinic '[]' Outside clinic '[]' Endocrinology       provider '[]' Primary care provider '[]' Other provider  Specify:                             '[]' Unknown Reason (check all that apply): '[]' eGFR <45 '[]' HbA1c<7% on metformin monotherapy OR already on GLP1RA and do not need to start another anti- hyperglycemic '[]' Already dehydrated '[]' Low blood pressure '[]' High risk of Hypoglycemia '[]' Prior DKA '[]' Recurrent mycotic genital infections '[]' History of or at risk for amputation '[]' Patient has experienced other side effects to this medication before '[]' Patient will be unable to adhere/monitor '[]' Patient unable to afford it '[]' Patient does not want to take this medication '[]' Pregnancy '[]' Other (specify: ) '[]' Unknown Reason Date discontinued:        / /             MM DD YYYY Reason (check all that apply  '[]' eGFR now <45 '[]' Dehydration '[]' Low blood pressure '[]' Hypoglycemia '[]' DKA '[]' Mycotic genital infection '[]' Amputation '[]' Other medication side effects '[]' Patient unable    to adhere/monitor  '[]' Had an operation/procedure     that required stopping it '[]' Patient unable to afford it '[]' Patient no longer wants to      take this medication '[]' Pregnancy '[]' Other (specify: ) '[]' Unknown Reason   If started or changed  Medication Name: '[]' Canaglifozin (Invokana) '[]' Dapagliflozin Wilder Glade) '[]' Empaglifozin (Jardiance) '[]' Ertugliflozin Actuary)           Medication Currently Prescribed? If started since last visit: If not started since last visit: If stopped since last visit:  GLP1 Receptor Agonist  '[x]' No >  '[]' Yes > Since last visit, medication was: '[]' Stopped '[]' Not started '[]' Started '[]' Continued same medication '[]' Continued with medication changes Date started:        / /             MM DD YYYY Who prescribed? '[]' Cardiology provider  '[]' Study clinic '[]' Outside clinic '[]' Endocrinology       provider '[]' Primary care provider '[]' Other provider > Specify:                             '[]' Unknown Reason (check all that apply): '[]' Personal or family history  of  medullary thyroid cancer '[]' MEN2 '[]' HbA1c<7% on metformin monotherapy OR already on SGLT2i and do not need to start another anti-hyperglycemic '[]' eGFR now <30 '[]' High risk of Hypoglycemia '[]' History of pancreatitis '[]' Significant gastroparesis '[]' Prior gastric surgery '[]' Patient has experienced other side effects to this medication before '[]' Patient will be unable to adhere/monitor '[]' Patient unable to afford it '[]' Patient does not want to take this medication '[]' Pregnancy '[]' Other (specify: ) '[]' Unknown Reason Date discontinued:        / /             MM DD YYYY Reason (check all that apply): '[]' Medullary thyroid cancer '[]' MEN2 '[]' eGFR now <30 '[]' Hypoglycemia '[]' Pancreatitis '[]' Significant gastroparesis '[]' Gastric surgery '[]' Other medication side       effects '[]'   Patient  unable    to adhere/monitor                                  '[]' Had an operation/procedure that required stopping it '[]' Patient unable to afford it '[]' Patient no longer wants to take this medication '[]' Pregnancy '[]' Other (specify: ) '[]' Unknown Reason   If started or changed > Medication Name: '[]' Albiglutide (Tanzeum) '[]' Dulaglutide (Trulicity) '[]' Exanatide (Byetta, Bydureon) '[]' Liraglutide (Victoza, Saxenda) '[]' Lixisenatide (Adlyxin) '[]' Semaglutice (Ozempic)      Medication Currently Prescribed? If started since last visit: If not started since last visit: If stopped since last visit:  Other non Insulin diabetes medications '[x]' No '[]' Yes > Medication Name (check all that apply): '[]' Acarbose (Precose) '[]' Miglitol (Glyset) '[]' Glimepiride (Amaryl) '[]' Glipizide (Amaryl) '[]' Glyburide (Diabeta,       Glynase,   Micronase) '[]' Metformin (Fortamet,        Glucophage[including XR],        Glumetza, Riomet) '[]' Pioglitazone (Actos) '[]' Nateglinide (Starlix) '[]' Pramlintide (Symilin) '[]' Repaglinide (Prandin) '[]' Rosiglitazone (Avandia) '[]' Alogliptin (Nesina) '[]' Linagliptin (Tradjenta) '[]' Saxagliptin (Onglyza) '[]' Sitagliptin  (Januvia) '[]' Bromocriptine Quick Release (Cycloset)     Insulin '[]' No '[x]'  Yes > total daily dose: units     STATIN INTOLERANCE (PER EHR/OTHER SOURCE DATA)  1. Was CK checked? '[]' No '[]' Yes   >If yes, select from the following: '[]' CK not elevated '[]' CK elevated 1-5x upper limit of normal '[]' CK elevated >5x upper limit of normal  2. Does the patient have muscle symptoms? '[]' No '[]' Yes    >If yes, select from the following: Location and pattern of muscle symptoms (select all that apply) '[]' Symmetric, hip flexors or thighs '[]' Symmetric, calves '[]' Symmetric, proximal upper extremity '[]' Asymmetric, intermittent, or not specific to any area '[]' Unknown   Timing of muscle symptom in relation to starting statin regimen '[]' <4 weeks '[]' 4-12 weeks '[]' >12 weeks '[]' Unknown   Timing of muscle symptoms improvement after withdrawal of statin '[]' <2 weeks '[]' 2-4 weeks '[]' No improvement after 4 weeks '[]' Unknown  3. Was patient re-challenged with a statin regimen (even if same statin compound or regimen as above)?  '[]' No  '[]' Yes  '[]' Unknown  >If yes, select from the following: Timing of recurrence of similar muscle symptoms in relation to starting second regimen '[]' <4 weeks '[]' 4-12 weeks '[]' >12 weeks '[]' Similar symptoms did not recur '[]' Unknown   [email or web address]

## 2020-11-22 ENCOUNTER — Ambulatory Visit (INDEPENDENT_AMBULATORY_CARE_PROVIDER_SITE_OTHER): Payer: Worker's Compensation | Admitting: Orthopaedic Surgery

## 2020-11-22 ENCOUNTER — Encounter: Payer: Self-pay | Admitting: Orthopaedic Surgery

## 2020-11-22 DIAGNOSIS — G8929 Other chronic pain: Secondary | ICD-10-CM | POA: Diagnosis not present

## 2020-11-22 DIAGNOSIS — M25511 Pain in right shoulder: Secondary | ICD-10-CM | POA: Diagnosis not present

## 2020-11-22 NOTE — Progress Notes (Deleted)
Office Visit Note   Patient: [name]           Date of Birth: 16-May-1946           MRN: [id number] Visit Date: 11/22/2020              Requested by: [address] [place], [place] [address] [place],  [place] [address] PCP: [name], [name], DO   Assessment & Plan: Visit Diagnoses: No diagnosis found.  Plan: ***  Follow-Up Instructions: Return for post-op.   Orders:  No orders of the defined types were placed in this encounter.  No orders of the defined types were placed in this encounter.     Procedures: No procedures performed   Clinical Data: No additional findings.   Subjective: Chief Complaint  Patient presents with  . Right Shoulder - Pain    HPI  Review of Systems   Objective: Vital Signs: There were no vitals taken for this visit.  Physical Exam  Ortho Exam  Specialty Comments:  No specialty comments available.  Imaging: No results found.   PMFS History: Patient Active Problem List   Diagnosis Date Noted  . Acute pain of right shoulder 08/18/2020  . Primary osteoarthritis of right knee 08/18/2020  . Impacted cerumen of right ear 05/24/2020  . Bilateral impacted cerumen 05/17/2020  . Hyperlipidemia associated with type 2 diabetes mellitus (HCC) 03/10/2020  . Cough 03/10/2020  . Seasonal allergies 03/10/2020  . Elevated rheumatoid factor 07/21/2018  . Gastroenteritis 07/21/2018  . Trigger middle finger of left hand 12/19/2017  . Ganglion cyst of dorsum of right wrist 12/19/2017  . Morton's neuroma of right foot 12/19/2017  . Preventative health care 12/13/2017  . Pain in both hands 12/13/2017  . Hyperlipidemia LDL goal <100 12/13/2017  . Abscess of upper gum 12/13/2017  . Spinal stenosis of cervical region 09/25/2016  . Cervical radiculopathy 09/06/2016  . Dyspnea 02/18/2016  . Chest pain 02/18/2016  . HLD (hyperlipidemia) 02/18/2016  . CAD in native artery 02/18/2016  . RLS (restless legs syndrome)  02/18/2016  . Pancreatitis 02/18/2016  . SOB (shortness of breath) 02/18/2016  . Uncontrolled type 2 diabetes mellitus with complication (HCC)   . Tobacco abuse   . Osteoarthritis of right knee 07/05/2015  . Smoking 07/02/2013  . Acute upper respiratory infections of unspecified site 07/02/2013  . Flu vaccine need 07/02/2013  . Multinodular goiter 04/16/2013  . Bunion 07/07/2012  . BACK PAIN, THORACIC REGION, RIGHT 05/24/2009  . IRON DEFICIENCY 03/25/2009  . GERD 03/25/2009  . WEIGHT LOSS 03/25/2009  . SINUSITIS- ACUTE-NOS 11/25/2008  . Hyperlipidemia LDL goal <70 10/07/2008  . ACUTE PANCREATITIS 09/07/2008  . OVARIAN CYST 09/07/2008  . KNEE PAIN, RIGHT 05/24/2008  . CARPAL TUNNEL SYNDROME, RIGHT 05/12/2008  . Acute pain of both shoulders 04/02/2008  . ROTATOR CUFF REPAIR, RIGHT, HX OF 03/29/2008  . GOITER, MULTINODULAR 12/26/2007  . Depression 07/07/2007  . HIP PAIN, RIGHT 03/11/2007  . RESTLESS LEG SYNDROME, HX OF 03/11/2007  . Diabetes mellitus type II, uncontrolled (HCC) 03/07/2007  . Essential hypertension 03/07/2007   Past Medical History:  Diagnosis Date  . Blockage of coronary artery of heart (HCC)   . Depression   . Diabetes mellitus   . Hyperlipidemia   . Hypertension   . Pancreatitis   . Restless leg syndrome     Family History  Problem Relation Age of Onset  . Heart attack Father   . Heart  attack Brother   . Diabetes Sister   . Cirrhosis Sister   . Hypertension Sister   . Cancer Mother   . Hypertension Mother   . Vision loss Mother   . Cancer Sister   . Sleep apnea Sister   . Heart Problems Sister   . Diabetes Sister   . Gout Sister   . Diabetes Son   . Heart attack Son   . Diabetes Daughter   . Goiter Daughter   . Diabetes Daughter     Past Surgical History:  Procedure Laterality Date  . CATARACT EXTRACTION, BILATERAL    . CORONARY ANGIOPLASTY WITH STENT PLACEMENT    . FOOT SURGERY Right    cyst removal  . ROTATOR CUFF REPAIR     right   . ROTATOR CUFF REPAIR Left   . TUBAL LIGATION     Social History   Occupational History  . Not on file  Tobacco Use  . Smoking status: Former Smoker    Packs/day: 0.20    Types: Cigarettes    Quit date: 05/05/2020    Years since quitting: 0.5  . Smokeless tobacco: Never Used  Vaping Use  . Vaping Use: Never used  Substance and Sexual Activity  . Alcohol use: No  . Drug use: No  . Sexual activity: Not on file

## 2020-11-22 NOTE — Progress Notes (Signed)
Office Visit Note   Patient: [name]           Date of Birth: June 11, 1946           MRN: [id number] Visit Date: 11/22/2020              Requested by: [address] [place], [place] [address] [place],  [place] [address] PCP: [name], [name], DO   Assessment & Plan: Visit Diagnoses:  1. Chronic right shoulder pain     Plan: MRI findings of the right shoulder reveal a mild to moderate diffuse tendinosis of the supraspinatus and infraspinatus and mild diffuse subscapularis tendinosis.  She has moderate subscapularis atrophy.  Interstitial tearing of the infraspinatus tendon.  No full-thickness tears identified.  She has a moderate lateral downsloping of the acromion.  She has diffuse degeneration of the superior labrum.  Likely prior biceps tenodesis.  These findings were reviewed with the patient.  Based on these findings and continue pain without any relief from conservative treatments and patient has been out of work since October she has elected to proceed with arthroscopic evaluation and debridement and repair as indicated.  Her A1c has to be in much better control before we can proceed with surgery.  Previous A1c was 10 in September.  She is scheduled to have this repeated later this week at endocrinologist office.  We will start the process of getting approval for surgery from Worker's Comp.  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  Follow-Up Instructions: Return for post-op.   Orders:  No orders of the defined types were placed in this encounter.  No orders of the defined types were placed in this encounter.     Procedures: No procedures performed   Clinical Data: No additional findings.   Subjective: Chief Complaint  Patient presents with  . Right Shoulder - Pain    Wonder returns today for MRI review of the right shoulder.  She  has been out of work since October.  She has had a couple of cortisone injections as well as physical therapy during this time and has not noticed any relief.  She works as a Lawyer.   Review of Systems   Objective: Vital Signs: There were no vitals taken for this visit.  Physical Exam  Ortho Exam Right shoulder exam is unchanged. Specialty Comments:  No specialty comments available.  Imaging: No results found.   PMFS History: Patient Active Problem List   Diagnosis Date Noted  . Acute pain of right shoulder 08/18/2020  . Primary osteoarthritis of right knee 08/18/2020  . Impacted cerumen of right ear 05/24/2020  . Bilateral impacted cerumen 05/17/2020  . Hyperlipidemia associated with type 2 diabetes mellitus (HCC) 03/10/2020  . Cough 03/10/2020  . Seasonal allergies 03/10/2020  . Elevated rheumatoid factor 07/21/2018  . Gastroenteritis 07/21/2018  . Trigger middle finger of left hand 12/19/2017  . Ganglion cyst of dorsum of right wrist 12/19/2017  . Morton's neuroma of right foot 12/19/2017  . Preventative health care 12/13/2017  . Pain in both hands 12/13/2017  . Hyperlipidemia LDL goal <100 12/13/2017  . Abscess of upper gum 12/13/2017  . Spinal stenosis of cervical region 09/25/2016  . Cervical radiculopathy 09/06/2016  . Dyspnea 02/18/2016  . Chest pain 02/18/2016  . HLD (hyperlipidemia) 02/18/2016  . CAD in native artery 02/18/2016  .  RLS (restless legs syndrome) 02/18/2016  . Pancreatitis 02/18/2016  . SOB (shortness of breath) 02/18/2016  . Uncontrolled type 2 diabetes mellitus with complication (HCC)   . Tobacco abuse   . Osteoarthritis of right knee 07/05/2015  . Smoking 07/02/2013  . Acute upper respiratory infections of unspecified site 07/02/2013  . Flu vaccine need 07/02/2013  . Multinodular goiter 04/16/2013  . Bunion 07/07/2012  . BACK PAIN, THORACIC REGION, RIGHT 05/24/2009  . IRON DEFICIENCY 03/25/2009  . GERD 03/25/2009  . WEIGHT LOSS  03/25/2009  . SINUSITIS- ACUTE-NOS 11/25/2008  . Hyperlipidemia LDL goal <70 10/07/2008  . ACUTE PANCREATITIS 09/07/2008  . OVARIAN CYST 09/07/2008  . KNEE PAIN, RIGHT 05/24/2008  . CARPAL TUNNEL SYNDROME, RIGHT 05/12/2008  . Acute pain of both shoulders 04/02/2008  . ROTATOR CUFF REPAIR, RIGHT, HX OF 03/29/2008  . GOITER, MULTINODULAR 12/26/2007  . Depression 07/07/2007  . HIP PAIN, RIGHT 03/11/2007  . RESTLESS LEG SYNDROME, HX OF 03/11/2007  . Diabetes mellitus type II, uncontrolled (HCC) 03/07/2007  . Essential hypertension 03/07/2007   Past Medical History:  Diagnosis Date  . Blockage of coronary artery of heart (HCC)   . Depression   . Diabetes mellitus   . Hyperlipidemia   . Hypertension   . Pancreatitis   . Restless leg syndrome     Family History  Problem Relation Age of Onset  . Heart attack Father   . Heart attack Brother   . Diabetes Sister   . Cirrhosis Sister   . Hypertension Sister   . Cancer Mother   . Hypertension Mother   . Vision loss Mother   . Cancer Sister   . Sleep apnea Sister   . Heart Problems Sister   . Diabetes Sister   . Gout Sister   . Diabetes Son   . Heart attack Son   . Diabetes Daughter   . Goiter Daughter   . Diabetes Daughter     Past Surgical History:  Procedure Laterality Date  . CATARACT EXTRACTION, BILATERAL    . CORONARY ANGIOPLASTY WITH STENT PLACEMENT    . FOOT SURGERY Right    cyst removal  . ROTATOR CUFF REPAIR     right  . ROTATOR CUFF REPAIR Left   . TUBAL LIGATION     Social History   Occupational History  . Not on file  Tobacco Use  . Smoking status: Former Smoker    Packs/day: 0.20    Types: Cigarettes    Quit date: 05/05/2020    Years since quitting: 0.5  . Smokeless tobacco: Never Used  Vaping Use  . Vaping Use: Never used  Substance and Sexual Activity  . Alcohol use: No  . Drug use: No  . Sexual activity: Not on file

## 2020-11-24 DIAGNOSIS — H35033 Hypertensive retinopathy, bilateral: Secondary | ICD-10-CM | POA: Diagnosis not present

## 2020-11-24 DIAGNOSIS — E049 Nontoxic goiter, unspecified: Secondary | ICD-10-CM | POA: Diagnosis not present

## 2020-11-24 DIAGNOSIS — E1140 Type 2 diabetes mellitus with diabetic neuropathy, unspecified: Secondary | ICD-10-CM | POA: Diagnosis not present

## 2020-11-24 DIAGNOSIS — H40013 Open angle with borderline findings, low risk, bilateral: Secondary | ICD-10-CM | POA: Diagnosis not present

## 2020-11-24 DIAGNOSIS — R809 Proteinuria, unspecified: Secondary | ICD-10-CM | POA: Diagnosis not present

## 2020-11-24 DIAGNOSIS — H524 Presbyopia: Secondary | ICD-10-CM | POA: Diagnosis not present

## 2020-11-24 DIAGNOSIS — E1165 Type 2 diabetes mellitus with hyperglycemia: Secondary | ICD-10-CM | POA: Diagnosis not present

## 2020-11-24 DIAGNOSIS — E7800 Pure hypercholesterolemia, unspecified: Secondary | ICD-10-CM | POA: Diagnosis not present

## 2020-11-24 DIAGNOSIS — I10 Essential (primary) hypertension: Secondary | ICD-10-CM | POA: Diagnosis not present

## 2020-11-24 DIAGNOSIS — E113393 Type 2 diabetes mellitus with moderate nonproliferative diabetic retinopathy without macular edema, bilateral: Secondary | ICD-10-CM | POA: Diagnosis not present

## 2020-11-24 DIAGNOSIS — H04123 Dry eye syndrome of bilateral lacrimal glands: Secondary | ICD-10-CM | POA: Diagnosis not present

## 2020-11-24 DIAGNOSIS — E1121 Type 2 diabetes mellitus with diabetic nephropathy: Secondary | ICD-10-CM | POA: Diagnosis not present

## 2020-11-24 LAB — HM DIABETES EYE EXAM

## 2020-12-01 ENCOUNTER — Ambulatory Visit: Payer: Medicare HMO | Admitting: Family Medicine

## 2020-12-06 ENCOUNTER — Encounter: Payer: Self-pay | Admitting: Family Medicine

## 2020-12-06 ENCOUNTER — Ambulatory Visit (INDEPENDENT_AMBULATORY_CARE_PROVIDER_SITE_OTHER): Payer: Medicare HMO | Admitting: Family Medicine

## 2020-12-06 ENCOUNTER — Other Ambulatory Visit: Payer: Self-pay

## 2020-12-06 ENCOUNTER — Ambulatory Visit: Payer: Medicare HMO | Admitting: Family Medicine

## 2020-12-06 VITALS — BP 120/60 | HR 55 | Temp 98.8°F | Resp 18 | Ht 65.0 in | Wt 175.0 lb

## 2020-12-06 DIAGNOSIS — I10 Essential (primary) hypertension: Secondary | ICD-10-CM

## 2020-12-06 DIAGNOSIS — E1165 Type 2 diabetes mellitus with hyperglycemia: Secondary | ICD-10-CM

## 2020-12-06 DIAGNOSIS — Z01818 Encounter for other preprocedural examination: Secondary | ICD-10-CM | POA: Diagnosis not present

## 2020-12-06 DIAGNOSIS — E785 Hyperlipidemia, unspecified: Secondary | ICD-10-CM

## 2020-12-06 DIAGNOSIS — E1169 Type 2 diabetes mellitus with other specified complication: Secondary | ICD-10-CM | POA: Diagnosis not present

## 2020-12-06 NOTE — Progress Notes (Signed)
Subjective:    [name] is a 75 y.o. female who presents to the office today for a preoperative consultation at the request of surgeon DR Erlinda Hong who plans on performing R shoulder arthrosopy on TBS. This consultation is requested for the specific conditions prompting preoperative evaluation (i.e. because of potential affect on operative risk): DM, htn and hyperlipidemia. Planned anesthesia: general. The patient has the following known anesthesia issues: none. Patients bleeding risk: no recent abnormal bleeding.   The following portions of the patient's history were reviewed and updated as appropriate:  She  has a past medical history of Blockage of coronary artery of heart ([place]), Depression, Diabetes mellitus, Hyperlipidemia, Hypertension, Pancreatitis, and Restless leg syndrome. She does not have any pertinent problems on file. She  has a past surgical history that includes Tubal ligation; Rotator cuff repair; Coronary angioplasty with stent; Foot surgery (Right); Cataract extraction, bilateral; and Rotator cuff repair (Left). Her family history includes Cancer in her mother and sister; Cirrhosis in her sister; Diabetes in her daughter, daughter, sister, sister, and son; Goiter in her daughter; Gout in her sister; Heart Problems in her sister; Heart attack in her brother, father, and son; Hypertension in her mother and sister; Sleep apnea in her sister; Vision loss in her mother. She  reports that she quit smoking about 7 months ago. Her smoking use included cigarettes. She smoked 0.20 packs per day. She has never used smokeless tobacco. She reports that she does not drink alcohol and does not use drugs. She has a current medication list which includes the following prescription(s): b-d single use swabs regular, alendronate, alprazolam, amlodipine, aspirin ec, tai doc control, true metrix air glucose meter, carboxymethylcellulose sodium, fenofibrate, ferrous sulfate, flaxseed (linseed), fluoxetine,  fluticasone, true metrix blood glucose test, hydrocodone-acetaminophen, insulin syringe-needle u-100, lancing device, levocetirizine, lisinopril, meloxicam, metoprolol succinate, multivitamin with minerals, neomycin-polymyxin-hydrocortisone, fish oil, rosuvastatin, tramadol, trueplus lancets 33g, and vitamin e. Current Outpatient Medications on File Prior to Visit  Medication Sig Dispense Refill  . Alcohol Swabs (B-D SINGLE USE SWABS REGULAR) PADS USE AS DIRECTED 400 each 1  . alendronate (FOSAMAX) 70 MG tablet Take with a full glass of water on an empty stomach. 12 tablet 2  . ALPRAZolam (XANAX) 0.25 MG tablet Take 1 tablet by mouth three times a day if needed 60 tablet 0  . amLODipine (NORVASC) 10 MG tablet Take 1 tablet (10 mg total) by mouth daily. 90 tablet 1  . aspirin EC 81 MG tablet Take 81 mg by mouth daily.    . Blood Glucose Calibration (TAI DOC CONTROL) NORMAL SOLN     . Blood Glucose Monitoring Suppl (TRUE METRIX AIR GLUCOSE METER) w/Device KIT 1 each by Does not apply route 4 (four) times daily. DX CODE E11.8 1 kit 0  . Carboxymethylcellulose Sodium (THERATEARS OP) Place 1 drop into both eyes daily as needed (dry eyes).    . fenofibrate 160 MG tablet Take 1 tablet (160 mg total) by mouth daily. 90 tablet 1  . ferrous sulfate 325 (65 FE) MG tablet Take 325 mg by mouth daily with breakfast.     . Flaxseed, Linseed, (FLAXSEED OIL PO) Take 1 capsule by mouth 2 (two) times daily.     [name] FLUoxetine (PROZAC) 40 MG capsule Take 1 capsule (40 mg total) by mouth daily. 90 capsule 1  . fluticasone (FLONASE) 50 MCG/ACT nasal spray SHAKE LIQUID AND USE 2 SPRAYS IN EACH NOSTRIL DAILY 48 g 1  . glucose blood (TRUE METRIX BLOOD  GLUCOSE TEST) test strip USE AS DIRECTED 4 TIMES A DAY 400 strip 1  . HYDROcodone-acetaminophen (NORCO) 5-325 MG tablet Take 1-2 tablets by mouth daily as needed. 20 tablet 0  . Insulin Syringe-Needle U-100 (BD INSULIN SYRINGE U/F) 31G X 5/16" 0.3 ML MISC 10 Units by Does not  apply route 3 (three) times daily before meals. 100 each 0  . Lancet Devices (LANCING DEVICE) MISC     . levocetirizine (XYZAL) 5 MG tablet TAKE 1 TABLET(5 MG) BY MOUTH EVERY EVENING 90 tablet 1  . lisinopril (ZESTRIL) 40 MG tablet Take 1 tablet (40 mg total) by mouth daily. 90 tablet 3  . meloxicam (MOBIC) 7.5 MG tablet Take 1 tablet (7.5 mg total) by mouth daily as needed for pain. 30 tablet 0  . metoprolol succinate (TOPROL-XL) 50 MG 24 hr tablet TAKE 1 TABLET(50 MG) BY MOUTH DAILY WITH FOOD 90 tablet 1  . Multiple Vitamin (MULTIVITAMIN WITH MINERALS) TABS Take 1 tablet by mouth daily.    [name] neomycin-polymyxin-hydrocortisone (CORTISPORIN) 3.5-10000-1 OTIC suspension Place 4 drops into the left ear 3 (three) times daily as needed.    . Omega-3 Fatty Acids (FISH OIL) 1000 MG CAPS Take by mouth.    . rosuvastatin (CRESTOR) 20 MG tablet TAKE 1 TABLET(20 MG) BY MOUTH DAILY 30 tablet 0  . traMADol (ULTRAM) 50 MG tablet Take 1 tablet (50 mg total) by mouth 3 (three) times daily as needed. 30 tablet 0  . TRUEplus Lancets 33G MISC USE AS DIRECTED 4 TIMES A DAY 400 each 1  . vitamin E 400 UNIT capsule Take 400 Units by mouth daily.     No current facility-administered medications on file prior to visit.   She is allergic to clindamycin, levemir [insulin detemir], and metformin and related..  Review of Systems Review of Systems  Constitutional: Negative for activity change, appetite change and fatigue.  HENT: Negative for hearing loss, congestion, tinnitus and ear discharge.  dentist q48mEyes: Negative for visual disturbance (see optho q1y -- vision corrected to 20/20 with glasses).  Respiratory: Negative for cough, chest tightness and shortness of breath.   Cardiovascular: Negative for chest pain, palpitations and leg swelling.  Gastrointestinal: Negative for abdominal pain, diarrhea, constipation and abdominal distention.  Genitourinary: Negative for urgency, frequency, decreased urine volume and  difficulty urinating.  Musculoskeletal: R shoulder pain  Skin: Negative for color change, pallor and rash.  Neurological: Negative for dizziness, light-headedness, numbness and headaches.  Hematological: Negative for adenopathy. Does not bruise/bleed easily.  Psychiatric/Behavioral: Negative for suicidal ideas, confusion, sleep disturbance, self-injury, dysphoric mood, decreased concentration and agitation.       Objective:    BP 120/60 (BP Location: Right Arm, Patient Position: Sitting, Cuff Size: Large)   Pulse (!) 55   Temp 98.8 F (37.1 C) (Oral)   Resp 18   Ht '5\' 5"'  (1.651 m)   Wt 175 lb (79.4 kg)   SpO2 97%   BMI 29.12 kg/m  General appearance: alert, cooperative, appears stated age and no distress Head: Normocephalic, without obvious abnormality, atraumatic Eyes: negative findings: lids and lashes normal, conjunctivae and sclerae normal and pupils equal, round, reactive to light and accomodation Ears: normal TM's and external ear canals both ears Neck: no adenopathy, no carotid bruit, no JVD, supple, symmetrical, trachea midline and thyroid not enlarged, symmetric, no tenderness/mass/nodules Back: symmetric, no curvature. ROM normal. No CVA tenderness. Lungs: clear to auscultation bilaterally Heart: regular rate and rhythm, S1, S2 normal, no murmur, click, rub or  gallop Abdomen: soft, non-tender; bowel sounds normal; no masses,  no organomegaly Extremities: R shoulder pain / dec rom Pulses: 2+ and symmetric Skin: Skin color, texture, turgor normal. No rashes or lesions Lymph nodes: Cervical, supraclavicular, and axillary nodes normal. Neurologic: Alert and oriented X 3, normal strength and tone. Normal symmetric reflexes. Normal coordination and gait   Cardiographics ECG: no change since previous ECG dated 09/23/2020  Lab Review  Abstract on 11/28/2020  Component Date Value  . HM Diabetic Eye Exam 11/24/2020 Retinopathy*  Admission on 09/23/2020, Discharged on  09/23/2020  Component Date Value  . Lipase 09/23/2020 30   . Sodium 09/23/2020 137   . Potassium 09/23/2020 3.9   . Chloride 09/23/2020 102   . CO2 09/23/2020 22   . Glucose, Bld 09/23/2020 208*  . BUN 09/23/2020 19   . Creatinine, Ser 09/23/2020 0.82   . Calcium 09/23/2020 9.9   . Total Protein 09/23/2020 7.5   . Albumin 09/23/2020 4.5   . AST 09/23/2020 21   . ALT 09/23/2020 22   . Alkaline Phosphatase 09/23/2020 43   . Total Bilirubin 09/23/2020 0.6   . GFR, Estimated 09/23/2020 >60   . Anion gap 09/23/2020 13   . WBC 09/23/2020 13.9*  . RBC 09/23/2020 4.54   . Hemoglobin 09/23/2020 13.5   . HCT 09/23/2020 40.1   . MCV 09/23/2020 88.3   . [facility] 09/23/2020 29.7   . MCHC 09/23/2020 33.7   . RDW 09/23/2020 12.8   . Platelets 09/23/2020 215   . nRBC 09/23/2020 0.0   . Color, Urine 09/23/2020 YELLOW   . APPearance 09/23/2020 CLEAR   . Specific Gravity, Urine 09/23/2020 1.020   . pH 09/23/2020 5.5   . Glucose, UA 09/23/2020 >=500*  . Hgb urine dipstick 09/23/2020 NEGATIVE   . Bilirubin Urine 09/23/2020 NEGATIVE   . Ketones, ur 09/23/2020 NEGATIVE   . Protein, ur 09/23/2020 30*  . Nitrite 09/23/2020 NEGATIVE   . Leukocytes,Ua 09/23/2020 NEGATIVE   . RBC / HPF 09/23/2020 0-5   . WBC, UA 09/23/2020 NONE SEEN   . Bacteria, UA 09/23/2020 RARE*  . Squamous Epithelial / LPF 09/23/2020 6-10   . Troponin I (High Sensiti* 09/23/2020 7   . SARS Coronavirus 2 by RT* 09/23/2020 NEGATIVE   . Influenza A by PCR 09/23/2020 NEGATIVE   . Influenza B by PCR 09/23/2020 NEGATIVE   Lab on 06/15/2020  Component Date Value  . Hgb A1c MFr Bld 06/15/2020 10.0*  . Mean Plasma Glucose 06/15/2020 240   . eAG (mmol/L) 06/15/2020 13.3   . Glucose, Bld 06/15/2020 331*  . BUN 06/15/2020 17   . Creat 06/15/2020 1.04*  . BUN/Creatinine Ratio 06/15/2020 16   . Sodium 06/15/2020 135   . Potassium 06/15/2020 3.7   . Chloride 06/15/2020 100   . CO2 06/15/2020 27   . Calcium 06/15/2020 10.2   .  Total Protein 06/15/2020 6.9   . Albumin 06/15/2020 4.5   . Globulin 06/15/2020 2.4   . AG Ratio 06/15/2020 1.9   . Total Bilirubin 06/15/2020 0.4   . Alkaline phosphatase (AP* 06/15/2020 47   . AST 06/15/2020 18   . ALT 06/15/2020 17   . Cholesterol 06/15/2020 131   . HDL 06/15/2020 29*  . Triglycerides 06/15/2020 344*  . LDL Cholesterol (Calc) 06/15/2020 61   . Total CHOL/HDL Ratio 06/15/2020 4.5   . Non-HDL Cholesterol (Cal* 06/15/2020 102       Assessment:      75 y.o.  female with planned surgery as above.   Known risk factors for perioperative complications: Diabetes mellitus   Difficulty with intubation is not anticipated.  Cardiac Risk Estimation: pt to see cardiology Current medications which may produce withdrawal symptoms if withheld perioperatively: per surgery team    Plan:    1. Preoperative workup as follows ECG, hematocrit, electrolytes, creatinine, glucose, liver function studies, a1c. 2. Change in medication regimen before surgery: per surgical team. 3. Prophylaxis for cardiac events with perioperative beta-blockers: per cardiology. 4. Deep vein thrombosis prophylaxis postoperatively:regimen to be chosen by surgical team. 6 Other measures: --- pt to see cardiology as well  Lab Results  Component Value Date   HGBA1C 10.0 (H) 06/15/2020   Repeat labs today --- surgery will be postponed if a1c still elevated

## 2020-12-07 ENCOUNTER — Other Ambulatory Visit: Payer: Self-pay | Admitting: Family Medicine

## 2020-12-07 ENCOUNTER — Other Ambulatory Visit: Payer: Self-pay

## 2020-12-07 DIAGNOSIS — E1165 Type 2 diabetes mellitus with hyperglycemia: Secondary | ICD-10-CM

## 2020-12-07 LAB — CBC WITH DIFFERENTIAL/PLATELET
Basophils Absolute: 0 10*3/uL (ref 0.0–0.1)
Basophils Relative: 0.6 % (ref 0.0–3.0)
Eosinophils Absolute: 0.1 10*3/uL (ref 0.0–0.7)
Eosinophils Relative: 1.5 % (ref 0.0–5.0)
HCT: 36.6 % (ref 36.0–46.0)
Hemoglobin: 12.2 g/dL (ref 12.0–15.0)
Lymphocytes Relative: 49.5 % — ABNORMAL HIGH (ref 12.0–46.0)
Lymphs Abs: 3.5 10*3/uL (ref 0.7–4.0)
MCHC: 33.4 g/dL (ref 30.0–36.0)
MCV: 87.5 fl (ref 78.0–100.0)
Monocytes Absolute: 0.5 10*3/uL (ref 0.1–1.0)
Monocytes Relative: 7.4 % (ref 3.0–12.0)
Neutro Abs: 2.9 10*3/uL (ref 1.4–7.7)
Neutrophils Relative %: 41 % — ABNORMAL LOW (ref 43.0–77.0)
Platelets: 182 10*3/uL (ref 150.0–400.0)
RBC: 4.18 Mil/uL (ref 3.87–5.11)
RDW: 13.5 % (ref 11.5–15.5)
WBC: 7 10*3/uL (ref 4.0–10.5)

## 2020-12-07 LAB — COMPREHENSIVE METABOLIC PANEL
ALT: 16 U/L (ref 0–35)
AST: 16 U/L (ref 0–37)
Albumin: 4.4 g/dL (ref 3.5–5.2)
Alkaline Phosphatase: 38 U/L — ABNORMAL LOW (ref 39–117)
BUN: 25 mg/dL — ABNORMAL HIGH (ref 6–23)
CO2: 27 mEq/L (ref 19–32)
Calcium: 10.1 mg/dL (ref 8.4–10.5)
Chloride: 101 mEq/L (ref 96–112)
Creatinine, Ser: 0.96 mg/dL (ref 0.40–1.20)
GFR: 58.12 mL/min — ABNORMAL LOW (ref >60.00)
Glucose, Bld: 175 mg/dL — ABNORMAL HIGH (ref 70–99)
Potassium: 3.7 mEq/L (ref 3.5–5.1)
Sodium: 136 mEq/L (ref 135–145)
Total Bilirubin: 0.5 mg/dL (ref 0.2–1.2)
Total Protein: 7.2 g/dL (ref 6.0–8.3)

## 2020-12-07 LAB — LIPID PANEL
Cholesterol: 118 mg/dL (ref 0–200)
HDL: 32.4 mg/dL — ABNORMAL LOW (ref >39.00)
LDL Cholesterol: 57 mg/dL (ref 0–99)
NonHDL: 85.98
Total CHOL/HDL Ratio: 4
Triglycerides: 145 mg/dL (ref 0.0–149.0)
VLDL: 29 mg/dL (ref 0.0–40.0)

## 2020-12-07 LAB — MICROALBUMIN / CREATININE URINE RATIO
Creatinine,U: 91.7 mg/dL
Microalb Creat Ratio: 13.3 mg/g (ref 0.0–30.0)
Microalb, Ur: 12.2 mg/dL — ABNORMAL HIGH (ref 0.0–1.9)

## 2020-12-07 LAB — HEMOGLOBIN A1C: Hgb A1c MFr Bld: 9.4 % — ABNORMAL HIGH (ref 4.6–6.5)

## 2020-12-07 MED ORDER — GLIMEPIRIDE 2 MG PO TABS
2.0000 mg | ORAL_TABLET | Freq: Every day | ORAL | 1 refills | Status: DC
Start: 2020-12-07 — End: 2021-01-17

## 2020-12-08 ENCOUNTER — Other Ambulatory Visit: Payer: Self-pay | Admitting: Family Medicine

## 2020-12-12 ENCOUNTER — Telehealth: Payer: Self-pay | Admitting: Pharmacist

## 2020-12-12 NOTE — Progress Notes (Addendum)
Chronic Care Management Pharmacy Assistant   Name: [name]  MRN: [id number] DOB: [birth year]  Reason for Encounter: Disease State For DM.   Conditions to be addressed/monitored: Hypertension, Hyperlipidemia/Hx of CAD, Diabetes, Tobacco Use Disorder, Depression/Anxiety, Osteoporosis, Neuropathy  Recent office visits:  12/06/20 Emily Held, DO. For Pre-op Examination. STOPPED Azithromycin and Hydrocodone-Homatropine.  Recent consult visits:  11/22/20 Orthopedics Emily Arthur, MD. Right shoulder pian. Per note:Dicussed surgery. No medications changes.  Hospital visits: None since 10/31/20  Medications: Outpatient Encounter Medications as of 12/12/2020  Medication Sig   Alcohol Swabs (B-D SINGLE USE SWABS REGULAR) PADS USE AS DIRECTED   alendronate (FOSAMAX) 70 MG tablet Take with a full glass of water on an empty stomach.   ALPRAZolam (XANAX) 0.25 MG tablet Take 1 tablet by mouth three times a day if needed   amLODipine (NORVASC) 10 MG tablet Take 1 tablet (10 mg total) by mouth daily.   aspirin EC 81 MG tablet Take 81 mg by mouth daily.   Blood Glucose Calibration (TAI DOC CONTROL) NORMAL SOLN    Blood Glucose Monitoring Suppl (TRUE METRIX AIR GLUCOSE METER) w/Device KIT 1 each by Does not apply route 4 (four) times daily. 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   empagliflozin (JARDIANCE) 10 MG TABS tablet 1 tablet   fenofibrate 160 MG tablet Take 1 tablet (160 mg total) by mouth daily.   ferrous sulfate 325 (65 FE) MG tablet Take 325 mg by mouth daily with breakfast.    Flaxseed, Linseed, (FLAXSEED OIL PO) Take 1 capsule by mouth 2 (two) times daily.    FLUoxetine (PROZAC) 40 MG capsule Take 1 capsule (40 mg total) by mouth daily.   fluticasone (FLONASE) 50 MCG/ACT nasal spray SHAKE LIQUID AND USE 2 SPRAYS IN EACH NOSTRIL DAILY   glimepiride (AMARYL) 2 MG tablet Take 1  tablet (2 mg total) by mouth daily before breakfast.   glucose blood (TRUE METRIX BLOOD GLUCOSE TEST) test strip USE AS DIRECTED 4 TIMES A DAY   HYDROcodone-acetaminophen (NORCO) 5-325 MG tablet Take 1-2 tablets by mouth daily as needed.   Insulin Syringe-Needle U-100 (BD INSULIN SYRINGE U/F) 31G X 5/16" 0.3 ML MISC 10 Units by Does not apply route 3 (three) times daily before meals.   JANUVIA 100 MG tablet Take 100 mg by mouth daily.   Lancet Devices (LANCING DEVICE) MISC    levocetirizine (XYZAL) 5 MG tablet TAKE 1 TABLET(5 MG) BY MOUTH EVERY EVENING   lisinopril (ZESTRIL) 40 MG tablet Take 1 tablet (40 mg total) by mouth daily.   meloxicam (MOBIC) 7.5 MG tablet Take 1 tablet (7.5 mg total) by mouth daily as needed for pain.   metoprolol succinate (TOPROL-XL) 50 MG 24 hr tablet TAKE 1 TABLET(50 MG) BY MOUTH DAILY WITH FOOD   Multiple Vitamin (MULTIVITAMIN WITH MINERALS) TABS Take 1 tablet by mouth daily.   neomycin-polymyxin-hydrocortisone (CORTISPORIN) 3.5-10000-1 OTIC suspension Place 4 drops into the left ear 3 (three) times daily as needed.   NOVOLIN 70/30 FLEXPEN (70-30) 100 UNIT/ML KwikPen SMARTSIG:60 Unit(s) SUB-Q Twice Daily   Omega-3 Fatty Acids (FISH OIL) 1000 MG CAPS Take by mouth.   rosuvastatin (CRESTOR) 20 MG tablet Take 1 tablet (20 mg total) by mouth daily.   traMADol (ULTRAM) 50 MG tablet Take 1 tablet (50 mg total) by mouth 3 (three) times daily as needed.   TRUEplus Lancets 33G MISC USE  AS DIRECTED 4 TIMES A DAY   vitamin E 400 UNIT capsule Take 400 Units by mouth daily.   No facility-administered encounter medications on file as of 12/12/2020.   Recent Relevant Labs: Lab Results  Component Value Date/Time   HGBA1C 9.4 (H) 12/06/2020 04:03 PM   HGBA1C 10.0 (H) 06/15/2020 08:19 AM   MICROALBUR 12.2 (H) 12/06/2020 04:03 PM   MICROALBUR 27.3 (H) 07/28/2019 02:50 PM    Kidney Function Lab Results  Component Value Date/Time   CREATININE 0.96 12/06/2020  04:03 PM   CREATININE 0.82 09/23/2020 02:38 PM   CREATININE 1.04 (H) 06/15/2020 08:19 AM   GFR 58.12 (L) 12/06/2020 04:03 PM   GFRNONAA >60 09/23/2020 02:38 PM   GFRAA >60 02/18/2016 10:01 AM     Current antihyperglycemic regimen:  o Novolin 70/30 Flexpen 100 Units; 60 units twice daily o Jardiance 10 mg daily  o Glimepiride 2 mg daily  o Januvia 100 mg daily   What recent interventions/DTPs have been made to improve glycemic control:  o None.   Have there been any recent hospitalizations or ED visits since last visit with CPP? Patient stated no.   Patient reports hypoglycemic symptoms, including Sweaty, Hungry and Nervous/irritable    Patient reports hyperglycemic symptoms, including fatigue and polyuria (At night)    How often are you checking your blood sugar? once daily and twice daily    What are your blood sugars ranging? Patient stated her blood sugars range around 140-150's and sometimes at night it spikes up to 200.   During the week, how often does your blood glucose drop below 70? Patient stated her blood sugar has not dropped below 70 in the last two weeks.    Are you checking your feet daily/regularly?  o Patient stated she checks her feet daily/regularly.   Adherence Review: Is the patient currently on a STATIN medication? Yes, Rosuvastatin 20 mg  Is the patient currently on ACE/ARB medication? Yes, Lisinopril 40 mg daily,  Does the patient have >5 day gap between last estimated fill dates? No  Star Rating Drugs: Lisinopril 40 mg daily, Rosuvastatin 20 mg daily  Patient feels like her insulin pen needs to be changed rather it be higher or lower. She stated her main concern is her urge to eat more then she should she wanted to talk to the pharmacist about Trulicity to see if it could improve her diet, informed her that he would reach out to her within the next 24 hours.    Follow Up: Pharmacist review  [name], RMA Clinical Pharmacist  Assistant [phone]  16 minutes spent in review, coordination, and documentation.  Counseled on dietary changes, and explained MOA of Trulicity to patient.  Encouraged her to try dietary mods then contact us if she is still interested in Trulicity.  Reviewed by: [name], PharmD Clinical Pharmacist [facility] [phone]

## 2020-12-16 ENCOUNTER — Telehealth: Payer: Self-pay | Admitting: Family Medicine

## 2020-12-16 NOTE — Telephone Encounter (Signed)
Patient states she would like some pain medication for her right shoulder.  [id number] [place], [place] - [phone] AT [facility] OF [facility] & [address] Phone:  [phone]  Fax:  [phone]

## 2020-12-16 NOTE — Telephone Encounter (Signed)
Looks like patient has chronic right shoulder pain and has been given tramadol and NORCO by a [place], [place]? Pt need to contact ortho?

## 2020-12-16 NOTE — Telephone Encounter (Signed)
Spoke with patient. She will call orthopedics

## 2020-12-16 NOTE — Telephone Encounter (Signed)
Yes-- she is supposed to have shoulder surgery She will need to call ortho for meds

## 2020-12-27 ENCOUNTER — Ambulatory Visit: Payer: Medicare HMO | Admitting: Family Medicine

## 2020-12-27 ENCOUNTER — Other Ambulatory Visit: Payer: Self-pay

## 2020-12-29 ENCOUNTER — Other Ambulatory Visit: Payer: Self-pay

## 2020-12-29 ENCOUNTER — Ambulatory Visit (INDEPENDENT_AMBULATORY_CARE_PROVIDER_SITE_OTHER): Payer: Medicare HMO | Admitting: Family Medicine

## 2020-12-29 ENCOUNTER — Encounter: Payer: Self-pay | Admitting: Family Medicine

## 2020-12-29 VITALS — BP 118/60 | HR 68 | Temp 98.7°F | Resp 18 | Ht 65.0 in | Wt 177.6 lb

## 2020-12-29 DIAGNOSIS — E1165 Type 2 diabetes mellitus with hyperglycemia: Secondary | ICD-10-CM

## 2020-12-29 LAB — GLUCOSE, POCT (MANUAL RESULT ENTRY): POC Glucose: 115 mg/dl — AB (ref 70–99)

## 2020-12-29 MED ORDER — EMPAGLIFLOZIN 25 MG PO TABS: 25.0000 mg | ORAL_TABLET | Freq: Every day | ORAL | 1 refills | Status: DC

## 2020-12-29 NOTE — Patient Instructions (Signed)

## 2020-12-29 NOTE — Progress Notes (Signed)
Patient ID: [name], female    DOB: 08-26-1946  Age: 75 y.o. MRN: [id number]    Subjective:  Subjective  HPI [name] presents for f/u dm--- pt has not had app with endo yet --- she states her sugars are running 115- 254  Her app with endo is next week  Review of Systems  Constitutional: Negative for appetite change, diaphoresis, fatigue and unexpected weight change.  Eyes: Negative for pain, redness and visual disturbance.  Respiratory: Negative for cough, chest tightness, shortness of breath and wheezing.   Cardiovascular: Negative for chest pain, palpitations and leg swelling.  Endocrine: Negative for cold intolerance, heat intolerance, polydipsia, polyphagia and polyuria.  Genitourinary: Negative for difficulty urinating, dysuria and frequency.  Neurological: Negative for dizziness, light-headedness, numbness and headaches.    History Past Medical History:  Diagnosis Date  . Blockage of coronary artery of heart ([place])   . Depression   . Diabetes mellitus   . Hyperlipidemia   . Hypertension   . Pancreatitis   . Restless leg syndrome     She has a past surgical history that includes Tubal ligation; Rotator cuff repair; Coronary angioplasty with stent; Foot surgery (Right); Cataract extraction, bilateral; and Rotator cuff repair (Left).   Her family history includes Cancer in her mother and sister; Cirrhosis in her sister; Diabetes in her daughter, daughter, sister, sister, and son; Goiter in her daughter; Gout in her sister; Heart Problems in her sister; Heart attack in her brother, father, and son; Hypertension in her mother and sister; Sleep apnea in her sister; Vision loss in her mother.She reports that she quit smoking about 7 months ago. Her smoking use included cigarettes. She smoked 0.20 packs per day. She has never used smokeless tobacco. She reports that she does not drink alcohol and does not use drugs.  Current Outpatient Medications on File Prior to  Visit  Medication Sig Dispense Refill  . Alcohol Swabs (B-D SINGLE USE SWABS REGULAR) PADS USE AS DIRECTED 400 each 1  . alendronate (FOSAMAX) 70 MG tablet Take with a full glass of water on an empty stomach. 12 tablet 2  . ALPRAZolam (XANAX) 0.25 MG tablet Take 1 tablet by mouth three times a day if needed 60 tablet 0  . amLODipine (NORVASC) 10 MG tablet Take 1 tablet (10 mg total) by mouth daily. 90 tablet 1  . aspirin EC 81 MG tablet Take 81 mg by mouth daily.    . Blood Glucose Calibration (TAI DOC CONTROL) NORMAL SOLN     . Blood Glucose Monitoring Suppl (TRUE METRIX AIR GLUCOSE METER) w/Device KIT 1 each by Does not apply route 4 (four) times daily. DX CODE E11.8 1 kit 0  . Carboxymethylcellulose Sodium (THERATEARS OP) Place 1 drop into both eyes daily as needed (dry eyes).    . fenofibrate 160 MG tablet Take 1 tablet (160 mg total) by mouth daily. 90 tablet 1  . ferrous sulfate 325 (65 FE) MG tablet Take 325 mg by mouth daily with breakfast.     . Flaxseed, Linseed, (FLAXSEED OIL PO) Take 1 capsule by mouth 2 (two) times daily.     [name] FLUoxetine (PROZAC) 40 MG capsule Take 1 capsule (40 mg total) by mouth daily. 90 capsule 1  . fluticasone (FLONASE) 50 MCG/ACT nasal spray SHAKE LIQUID AND USE 2 SPRAYS IN EACH NOSTRIL DAILY 48 g 1  . glimepiride (AMARYL) 2 MG tablet Take 1 tablet (2 mg total) by mouth daily before breakfast. 90 tablet  1  . glucose blood (TRUE METRIX BLOOD GLUCOSE TEST) test strip USE AS DIRECTED 4 TIMES A DAY 400 strip 1  . HYDROcodone-acetaminophen (NORCO) 5-325 MG tablet Take 1-2 tablets by mouth daily as needed. 20 tablet 0  . Insulin Syringe-Needle U-100 (BD INSULIN SYRINGE U/F) 31G X 5/16" 0.3 ML MISC 10 Units by Does not apply route 3 (three) times daily before meals. 100 each 0  . JANUVIA 100 MG tablet Take 100 mg by mouth daily.    Elmore Guise Devices (LANCING DEVICE) MISC     . levocetirizine (XYZAL) 5 MG tablet TAKE 1 TABLET(5 MG) BY MOUTH EVERY EVENING 90 tablet 1   . lisinopril (ZESTRIL) 40 MG tablet Take 1 tablet (40 mg total) by mouth daily. 90 tablet 3  . meloxicam (MOBIC) 7.5 MG tablet Take 1 tablet (7.5 mg total) by mouth daily as needed for pain. 30 tablet 0  . metoprolol succinate (TOPROL-XL) 50 MG 24 hr tablet TAKE 1 TABLET(50 MG) BY MOUTH DAILY WITH FOOD 90 tablet 1  . Multiple Vitamin (MULTIVITAMIN WITH MINERALS) TABS Take 1 tablet by mouth daily.    [name] neomycin-polymyxin-hydrocortisone (CORTISPORIN) 3.5-10000-1 OTIC suspension Place 4 drops into the left ear 3 (three) times daily as needed.    [name] NOVOLIN 70/30 FLEXPEN (70-30) 100 UNIT/ML KwikPen SMARTSIG:60 Unit(s) SUB-Q Twice Daily    . Omega-3 Fatty Acids (FISH OIL) 1000 MG CAPS Take by mouth.    . rosuvastatin (CRESTOR) 20 MG tablet Take 1 tablet (20 mg total) by mouth daily. 90 tablet 1  . traMADol (ULTRAM) 50 MG tablet Take 1 tablet (50 mg total) by mouth 3 (three) times daily as needed. 30 tablet 0  . TRUEplus Lancets 33G MISC USE AS DIRECTED 4 TIMES A DAY 400 each 1  . vitamin E 400 UNIT capsule Take 400 Units by mouth daily.     No current facility-administered medications on file prior to visit.     Objective:  Objective  Physical Exam Vitals and nursing note reviewed.  Constitutional:      Appearance: She is well-developed.  HENT:     Head: Normocephalic and atraumatic.  Eyes:     Conjunctiva/sclera: Conjunctivae normal.  Neck:     Thyroid: No thyromegaly.     Vascular: No carotid bruit or JVD.  Cardiovascular:     Rate and Rhythm: Normal rate and regular rhythm.     Heart sounds: Normal heart sounds. No murmur heard.   Pulmonary:     Effort: Pulmonary effort is normal. No respiratory distress.     Breath sounds: Normal breath sounds. No wheezing or rales.  Chest:     Chest wall: No tenderness.  Musculoskeletal:     Cervical back: Normal range of motion and neck supple.  Neurological:     Mental Status: She is alert and oriented to person, place, and time.    BP  118/60 (BP Location: Right Arm, Patient Position: Sitting, Cuff Size: Normal)   Pulse 68   Temp 98.7 F (37.1 C) (Oral)   Resp 18   Ht '5\' 5"'  (1.651 m)   Wt 177 lb 9.6 oz (80.6 kg)   SpO2 95%   BMI 29.55 kg/m  Wt Readings from Last 3 Encounters:  12/29/20 177 lb 9.6 oz (80.6 kg)  12/06/20 175 lb (79.4 kg)  09/23/20 176 lb (79.8 kg)     Lab Results  Component Value Date   WBC 7.0 12/06/2020   HGB 12.2 12/06/2020   HCT  36.6 12/06/2020   PLT 182.0 12/06/2020   GLUCOSE 175 (H) 12/06/2020   CHOL 118 12/06/2020   TRIG 145.0 12/06/2020   HDL 32.40 (L) 12/06/2020   LDLDIRECT 100.0 03/10/2020   LDLCALC 57 12/06/2020   ALT 16 12/06/2020   AST 16 12/06/2020   NA 136 12/06/2020   K 3.7 12/06/2020   CL 101 12/06/2020   CREATININE 0.96 12/06/2020   BUN 25 (H) 12/06/2020   CO2 27 12/06/2020   TSH 0.58 08/11/2018   INR 1.1 03/03/2009   HGBA1C 9.4 (H) 12/06/2020   MICROALBUR 12.2 (H) 12/06/2020    DG Chest 2 View  Result Date: 09/23/2020 CLINICAL DATA:  Shortness of breath. Tooth surgery on 1 stay. Vomiting. EXAM: CHEST - 2 VIEW COMPARISON:  Chest x-ray 08/11/2018 FINDINGS: The heart size and mediastinal contours are within normal limits. Vague streaky opacity at the bases. No definite focal consolidation. No pulmonary edema. No pleural effusion. No pneumothorax. No acute osseous abnormality. Multilevel degenerative changes of the spine. IMPRESSION: Vague streaky opacity at the bases that likely represents differential diagnosis includes inflammation (such as aspiration pneumonia) or infection. Electronically Signed   By: Iven Finn M.D.   On: 09/23/2020 15:20     Assessment & Plan:  Plan  I have discontinued Enid Derry C. Bruning's empagliflozin. I am also having her start on empagliflozin. Additionally, I am having her maintain her multivitamin with minerals, Lancing Device, Tai Doc Control, aspirin EC, (Flaxseed, Linseed, (FLAXSEED OIL PO)), ferrous sulfate, vitamin E,  Carboxymethylcellulose Sodium (THERATEARS OP), True Metrix Air Glucose Meter, True Metrix Blood Glucose Test, TRUEplus Lancets 33G, neomycin-polymyxin-hydrocortisone, Insulin Syringe-Needle U-100, B-D SINGLE USE SWABS REGULAR, ALPRAZolam, Fish Oil, alendronate, fenofibrate, lisinopril, fluticasone, levocetirizine, traMADol, amLODipine, meloxicam, metoprolol succinate, FLUoxetine, HYDROcodone-acetaminophen, glimepiride, NovoLIN 70/30 FlexPen, Januvia, and rosuvastatin.  Meds ordered this encounter  Medications  . empagliflozin (JARDIANCE) 25 MG TABS tablet    Sig: Take 1 tablet (25 mg total) by mouth daily before breakfast.    Dispense:  90 tablet    Refill:  1    Problem List Items Addressed This Visit   None   Visit Diagnoses    Type 2 diabetes mellitus with hyperglycemia, without long-term current use of insulin (HCC)    -  Primary   Relevant Medications   empagliflozin (JARDIANCE) 25 MG TABS tablet   Other Relevant Orders   POCT glucose (manual entry) (Completed)    increase jardiance to 25 mg daily con't other meds for now   Follow-up: Return in about 3 months (around 03/31/2021), or if symptoms worsen or fail to improve, for -----------.  Ann Held, DO

## 2020-12-30 ENCOUNTER — Telehealth: Payer: Self-pay | Admitting: Pharmacist

## 2020-12-30 DIAGNOSIS — H16223 Keratoconjunctivitis sicca, not specified as Sjogren's, bilateral: Secondary | ICD-10-CM | POA: Diagnosis not present

## 2020-12-30 DIAGNOSIS — H538 Other visual disturbances: Secondary | ICD-10-CM | POA: Diagnosis not present

## 2020-12-30 DIAGNOSIS — H04123 Dry eye syndrome of bilateral lacrimal glands: Secondary | ICD-10-CM | POA: Diagnosis not present

## 2020-12-30 DIAGNOSIS — H16142 Punctate keratitis, left eye: Secondary | ICD-10-CM | POA: Diagnosis not present

## 2020-12-30 LAB — HM DIABETES EYE EXAM

## 2020-12-30 NOTE — Progress Notes (Addendum)
Chronic Care Management Pharmacy Assistant   Name: [name]  MRN: [id number] DOB: 1946/09/06  Reason for Encounter: Medication Review-Medication Coordination Call  Medications: Outpatient Encounter Medications as of 12/30/2020  Medication Sig   Alcohol Swabs (B-D SINGLE USE SWABS REGULAR) PADS USE AS DIRECTED   alendronate (FOSAMAX) 70 MG tablet Take with a full glass of water on an empty stomach.   ALPRAZolam (XANAX) 0.25 MG tablet Take 1 tablet by mouth three times a day if needed   amLODipine (NORVASC) 10 MG tablet Take 1 tablet (10 mg total) by mouth daily.   aspirin EC 81 MG tablet Take 81 mg by mouth daily.   Blood Glucose Calibration (TAI DOC CONTROL) NORMAL SOLN    Blood Glucose Monitoring Suppl (TRUE METRIX AIR GLUCOSE METER) w/Device KIT 1 each by Does not apply route 4 (four) times daily. 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   empagliflozin (JARDIANCE) 25 MG TABS tablet Take 1 tablet (25 mg total) by mouth daily before breakfast.   fenofibrate 160 MG tablet Take 1 tablet (160 mg total) by mouth daily.   ferrous sulfate 325 (65 FE) MG tablet Take 325 mg by mouth daily with breakfast.    Flaxseed, Linseed, (FLAXSEED OIL PO) Take 1 capsule by mouth 2 (two) times daily.    FLUoxetine (PROZAC) 40 MG capsule Take 1 capsule (40 mg total) by mouth daily.   fluticasone (FLONASE) 50 MCG/ACT nasal spray SHAKE LIQUID AND USE 2 SPRAYS IN EACH NOSTRIL DAILY   glimepiride (AMARYL) 2 MG tablet Take 1 tablet (2 mg total) by mouth daily before breakfast.   glucose blood (TRUE METRIX BLOOD GLUCOSE TEST) test strip USE AS DIRECTED 4 TIMES A DAY   HYDROcodone-acetaminophen (NORCO) 5-325 MG tablet Take 1-2 tablets by mouth daily as needed.   Insulin Syringe-Needle U-100 (BD INSULIN SYRINGE U/F) 31G X 5/16" 0.3 ML MISC 10 Units by Does not apply route 3 (three) times daily before meals.   JANUVIA 100 MG tablet  Take 100 mg by mouth daily.   Lancet Devices (LANCING DEVICE) MISC    levocetirizine (XYZAL) 5 MG tablet TAKE 1 TABLET(5 MG) BY MOUTH EVERY EVENING   lisinopril (ZESTRIL) 40 MG tablet Take 1 tablet (40 mg total) by mouth daily.   meloxicam (MOBIC) 7.5 MG tablet Take 1 tablet (7.5 mg total) by mouth daily as needed for pain.   metoprolol succinate (TOPROL-XL) 50 MG 24 hr tablet TAKE 1 TABLET(50 MG) BY MOUTH DAILY WITH FOOD   Multiple Vitamin (MULTIVITAMIN WITH MINERALS) TABS Take 1 tablet by mouth daily.   neomycin-polymyxin-hydrocortisone (CORTISPORIN) 3.5-10000-1 OTIC suspension Place 4 drops into the left ear 3 (three) times daily as needed.   NOVOLIN 70/30 FLEXPEN (70-30) 100 UNIT/ML KwikPen SMARTSIG:60 Unit(s) SUB-Q Twice Daily   Omega-3 Fatty Acids (FISH OIL) 1000 MG CAPS Take by mouth.   rosuvastatin (CRESTOR) 20 MG tablet Take 1 tablet (20 mg total) by mouth daily.   traMADol (ULTRAM) 50 MG tablet Take 1 tablet (50 mg total) by mouth 3 (three) times daily as needed.   TRUEplus Lancets 33G MISC USE AS DIRECTED 4 TIMES A DAY   vitamin E 400 UNIT capsule Take 400 Units by mouth daily.   No facility-administered encounter medications on file as of 12/30/2020.    Reviewed chart for medication changes ahead of medication coordination call.  No Consults, or hospital visits since last care  coordination call.  Office visits: 12/29/20 Ann Held, MD. For Type 2 DM. Per note: Dicussed healthy eating. No medication changes.  No medication changes indicated.  BP Readings from Last 3 Encounters:  12/29/20 118/60  12/06/20 120/60  09/23/20 (!) 144/61    Lab Results  Component Value Date   HGBA1C 9.4 (H) 12/06/2020     Patient obtains medications through Adherence Packaging  90 Days   Last delivery included:(Acute Fill) Hydrocodone 5-325 mg; one to two tabs daily as needed for pain.  Patient declined meds last month: N/A  Patient is due for next adherence delivery on:  01/06/21.  Called patient and reviewed medications and coordinated delivery.  This delivery to include: Glimepiride 2 mg ; one tablet morning  Januvia 100 mg ; one tablet morning Jardiance 25 mg ; one tablet morning Lisinopril 40 mg ; one tablet morning  Rosuvastatin 20 mg ; one tablet evening  Metoprolol Succinate ER 50 mg ; one tablet morning Fluoxetine 40 mg ; one tablet morning Fenofibrate 160 mg ; one tablet evening  Women's one daily multivitamin ; one tablet Morning  Omega-3 Fatty Acids 1,000 mg ; one capsule at morning and one at bedtime Vitamin E 400 mg ; one tablet morning Amlodipine 10 mg ; one tablet morning Alendronate 70 mg ; one tablet once a week Wednesday Morning  Aspirin 81 mg ; one tablet morning Ferrous Sulfate 325 mg ; one tablet morning   Patient declined the following medications:(enough on hand) Pred Forte 1 % Trueplus pen needles  Humulin 70/30 Hydrocodone 5-325 mg; one to two tabs daily as needed for pain.  Patient does not need refills at this time.  Confirmed delivery date of 01/06/21, advised patient that pharmacy will contact them the morning of delivery.  Follow-Up:Pharmacist Review  Charlann Lange, RMA Clinical Pharmacist Assistant [phone]  12 minutes spent in review, coordination, and documentation.  Reviewed by: Beverly Milch, PharmD Clinical Pharmacist [employer] [facility] [phone]

## 2021-01-05 ENCOUNTER — Telehealth: Payer: Self-pay | Admitting: Orthopaedic Surgery

## 2021-01-05 ENCOUNTER — Other Ambulatory Visit: Payer: Self-pay | Admitting: Physician Assistant

## 2021-01-05 MED ORDER — TRAMADOL HCL 50 MG PO TABS
50.0000 mg | ORAL_TABLET | Freq: Three times a day (TID) | ORAL | 0 refills | Status: DC | PRN
Start: 2021-01-05 — End: 2021-05-10

## 2021-01-05 NOTE — Telephone Encounter (Signed)
Sent in tramadol

## 2021-01-05 NOTE — Telephone Encounter (Signed)
Please advise 

## 2021-01-05 NOTE — Telephone Encounter (Signed)
[facility] has approved surgery for right shoulder scope, however patient's A1c is too high for surgery.  Patient has an appointment to see endo doctor on 01-17-21. Patient is asking for something to help manage the pain until she can get her numbers down in order to have surgery.  She uses Engineering geologist at [facility].     pts cb [phone]

## 2021-01-05 NOTE — Telephone Encounter (Signed)
Pt called and informed.

## 2021-01-12 ENCOUNTER — Telehealth: Payer: Medicare HMO

## 2021-01-17 ENCOUNTER — Other Ambulatory Visit: Payer: Self-pay

## 2021-01-17 ENCOUNTER — Ambulatory Visit (INDEPENDENT_AMBULATORY_CARE_PROVIDER_SITE_OTHER): Payer: Medicare HMO | Admitting: Internal Medicine

## 2021-01-17 ENCOUNTER — Encounter: Payer: Self-pay | Admitting: Internal Medicine

## 2021-01-17 VITALS — BP 122/82 | Ht 65.0 in | Wt 177.2 lb

## 2021-01-17 DIAGNOSIS — E119 Type 2 diabetes mellitus without complications: Secondary | ICD-10-CM | POA: Insufficient documentation

## 2021-01-17 DIAGNOSIS — E1159 Type 2 diabetes mellitus with other circulatory complications: Secondary | ICD-10-CM

## 2021-01-17 DIAGNOSIS — Z794 Long term (current) use of insulin: Secondary | ICD-10-CM | POA: Diagnosis not present

## 2021-01-17 DIAGNOSIS — E1165 Type 2 diabetes mellitus with hyperglycemia: Secondary | ICD-10-CM | POA: Diagnosis not present

## 2021-01-17 LAB — POCT GLUCOSE (DEVICE FOR HOME USE): POC Glucose: 122 mg/dl — AB (ref 70–99)

## 2021-01-17 MED ORDER — EMPAGLIFLOZIN 25 MG PO TABS: 25.0000 mg | ORAL_TABLET | Freq: Every day | ORAL | 3 refills | Status: DC

## 2021-01-17 MED ORDER — NOVOLIN 70/30 FLEXPEN (70-30) 100 UNIT/ML ~~LOC~~ SUPN: PEN_INJECTOR | SUBCUTANEOUS | 3 refills | Status: DC

## 2021-01-17 MED ORDER — INSULIN PEN NEEDLE 32G X 4 MM MISC: 1.0000 | Freq: Two times a day (BID) | 3 refills | Status: DC

## 2021-01-17 NOTE — Progress Notes (Addendum)
Name: [name]  MRN/ DOB: [id number], Dec 05, 1945   Age/ Sex: 75 y.o., female    PCP: Carollee Herter, Alferd Apa, DO   Reason for Endocrinology Evaluation: Type 2 Diabetes Mellitus     Date of Initial Endocrinology Visit: 01/17/2021     PATIENT IDENTIFIER: [name] is a 75 y.o. female with a past medical history of HTN, T2DM,CAD, Pancreatitis and  Osteoporosis . The patient presented for initial endocrinology clinic visit on 01/17/2021 for consultative assistance with her diabetes management.    HPI: [name] was    Diagnosed with DM 1998 Prior Medications tried/Intolerance: Metformin- GI side effects  Currently checking blood sugars 2x / day,  before breakfast and dinner   Hypoglycemia episodes : yes                Symptoms: yes                 Frequency:   Hemoglobin A1c has ranged from 9.3% in 2015, peaking at 10.1% in 2020. Patient required assistance for hypoglycemia: no  Patient has required hospitalization within the last 1 year from hyper or hypoglycemia: no   In terms of diet, the patient eats 2 meals a day, has been reducing her snacks. Avoids sugar- sweetened beverages   She has history of Pancreatitis that she believes is caused Metformin   Denies nausea or vomiting   Awaiting right shoulder sx   HOME DIABETES REGIMEN: Jardiance 25 mg daily  Glimepiride 2 mg - not taking  Januvia 100 mg daily  Novolin 70/30 50 units QAM and 40 units QPM     Statin: yes ACE-I/ARB: yes    METER DOWNLOAD SUMMARY: Did not bring    DIABETIC COMPLICATIONS: Microvascular complications:    Denies: CKD , retinopathy, neuropathy  Last eye exam: Completed 12/2020  Macrovascular complications:   CAD ( S/P stent placement )  Denies:  PVD, CVA   PAST HISTORY: Past Medical History:  Past Medical History:  Diagnosis Date  . Blockage of coronary artery of heart ([place])   . Depression   . Diabetes mellitus   . Hyperlipidemia   . Hypertension   .  Pancreatitis   . Restless leg syndrome    Past Surgical History:  Past Surgical History:  Procedure Laterality Date  . CATARACT EXTRACTION, BILATERAL    . CORONARY ANGIOPLASTY WITH STENT PLACEMENT    . FOOT SURGERY Right    cyst removal  . ROTATOR CUFF REPAIR     right  . ROTATOR CUFF REPAIR Left   . TUBAL LIGATION        Social History:  reports that she quit smoking about 8 months ago. Her smoking use included cigarettes. She smoked 0.20 packs per day. She has never used smokeless tobacco. She reports that she does not drink alcohol and does not use drugs. Family History:  Family History  Problem Relation Age of Onset  . Heart attack Father   . Heart attack Brother   . Diabetes Sister   . Cirrhosis Sister   . Hypertension Sister   . Cancer Mother   . Hypertension Mother   . Vision loss Mother   . Cancer Sister   . Sleep apnea Sister   . Heart Problems Sister   . Diabetes Sister   . Gout Sister   . Diabetes Son   . Heart attack Son   . Diabetes Daughter   . Goiter Daughter   . Diabetes Daughter  HOME MEDICATIONS: Allergies as of 01/17/2021      Reactions   Clindamycin Nausea And Vomiting   Levemir [insulin Detemir] Hives, Swelling   Metformin And Related Other (See Comments)   Sick on stomach, sweaty, throw up, pain      Medication List       Accurate as of January 17, 2021  3:55 PM. If you have any questions, ask your nurse or doctor.        alendronate 70 MG tablet Commonly known as: FOSAMAX Take with a full glass of water on an empty stomach.   ALPRAZolam 0.25 MG tablet Commonly known as: XANAX Take 1 tablet by mouth three times a day if needed   amLODipine 10 MG tablet Commonly known as: NORVASC Take 1 tablet (10 mg total) by mouth daily.   aspirin EC 81 MG tablet Take 81 mg by mouth daily.   B-D SINGLE USE SWABS REGULAR Pads USE AS DIRECTED   empagliflozin 25 MG Tabs tablet Commonly known as: Jardiance Take 1 tablet (25 mg total) by  mouth daily before breakfast.   fenofibrate 160 MG tablet Take 1 tablet (160 mg total) by mouth daily.   ferrous sulfate 325 (65 FE) MG tablet Take 325 mg by mouth daily with breakfast.   Fish Oil 1000 MG Caps Take by mouth.   FLAXSEED OIL PO Take 1 capsule by mouth 2 (two) times daily.   FLUoxetine 40 MG capsule Commonly known as: PROZAC Take 1 capsule (40 mg total) by mouth daily.   fluticasone 50 MCG/ACT nasal spray Commonly known as: FLONASE SHAKE LIQUID AND USE 2 SPRAYS IN EACH NOSTRIL DAILY   glimepiride 2 MG tablet Commonly known as: AMARYL Take 1 tablet (2 mg total) by mouth daily before breakfast.   HYDROcodone-acetaminophen 5-325 MG tablet Commonly known as: Norco Take 1-2 tablets by mouth daily as needed.   Insulin Syringe-Needle U-100 31G X 5/16" 0.3 ML Misc Commonly known as: BD Insulin Syringe U/F 10 Units by Does not apply route 3 (three) times daily before meals.   Januvia 100 MG tablet Generic drug: sitaGLIPtin Take 100 mg by mouth daily.   Lancing Device Misc   levocetirizine 5 MG tablet Commonly known as: XYZAL TAKE 1 TABLET(5 MG) BY MOUTH EVERY EVENING   lisinopril 40 MG tablet Commonly known as: ZESTRIL Take 1 tablet (40 mg total) by mouth daily.   meloxicam 7.5 MG tablet Commonly known as: MOBIC Take 1 tablet (7.5 mg total) by mouth daily as needed for pain.   metoprolol succinate 50 MG 24 hr tablet Commonly known as: TOPROL-XL TAKE 1 TABLET(50 MG) BY MOUTH DAILY WITH FOOD   multivitamin with minerals Tabs tablet Take 1 tablet by mouth daily.   neomycin-polymyxin-hydrocortisone 3.5-10000-1 OTIC suspension Commonly known as: CORTISPORIN Place 4 drops into the left ear 3 (three) times daily as needed.   NovoLIN 70/30 FlexPen (70-30) 100 UNIT/ML KwikPen Generic drug: insulin isophane & regular human SMARTSIG:60 Unit(s) SUB-Q Twice Daily   rosuvastatin 20 MG tablet Commonly known as: CRESTOR Take 1 tablet (20 mg total) by mouth  daily.   Tai Doc Control Normal Soln   THERATEARS OP Place 1 drop into both eyes daily as needed (dry eyes).   traMADol 50 MG tablet Commonly known as: ULTRAM Take 1 tablet (50 mg total) by mouth 3 (three) times daily as needed.   True Metrix Air Glucose Meter w/Device Kit 1 each by Does not apply route 4 (four) times daily. DX CODE  E11.8   True Metrix Blood Glucose Test test strip Generic drug: glucose blood USE AS DIRECTED 4 TIMES A DAY   TRUEplus Lancets 33G Misc USE AS DIRECTED 4 TIMES A DAY   vitamin E 180 MG (400 UNITS) capsule Take 400 Units by mouth daily.        ALLERGIES: Allergies  Allergen Reactions  . Clindamycin Nausea And Vomiting  . Levemir [Insulin Detemir] Hives and Swelling  . Metformin And Related Other (See Comments)    Sick on stomach, sweaty, throw up, pain     REVIEW OF SYSTEMS: A comprehensive ROS was conducted with the patient and is negative except as per HPI    OBJECTIVE:   VITAL SIGNS: BP 122/82   Ht '5\' 5"'  (1.651 m)   Wt 177 lb 4 oz (80.4 kg)   BMI 29.50 kg/m    PHYSICAL EXAM:  General: Pt appears well and is in NAD  Neck: General: Supple without adenopathy or carotid bruits. Thyroid: Thyroid size normal.  No goiter or nodules appreciated  Lungs: Clear with good BS bilat with no rales, rhonchi, or wheezes  Heart: RRR with normal S1 and S2 and no gallops; no murmurs; no rub  Abdomen: Normoactive bowel sounds, soft, nontender, without masses or organomegaly palpable  Extremities:  Lower extremities - No pretibial edema. No lesions.  Skin: Normal texture and temperature to palpation. No rash noted. No Acanthosis nigricans/skin tags. No lipohypertrophy.  Neuro: MS is good with appropriate affect, pt is alert and Ox3    DM foot exam: 01/17/2021  The skin of the feet is intact without sores or ulcerations. The pedal pulses are 2+ on right and 2+ on left. The sensation is intact to a screening 5.07, 10 gram monofilament  bilaterally   DATA REVIEWED:  Lab Results  Component Value Date   HGBA1C 9.4 (H) 12/06/2020   HGBA1C 10.0 (H) 06/15/2020   HGBA1C 9.4 (H) 03/10/2020   Lab Results  Component Value Date   MICROALBUR 12.2 (H) 12/06/2020   LDLCALC 57 12/06/2020   CREATININE 0.96 12/06/2020   Lab Results  Component Value Date   MICRALBCREAT 13.3 12/06/2020    Lab Results  Component Value Date   CHOL 118 12/06/2020   HDL 32.40 (L) 12/06/2020   LDLCALC 57 12/06/2020   LDLDIRECT 100.0 03/10/2020   TRIG 145.0 12/06/2020   CHOLHDL 4 12/06/2020        ASSESSMENT / PLAN / RECOMMENDATIONS:   1) Type 2 Diabetes Mellitus, Poorly controlled, With macrovascular complications - Most recent A1c of 9.4 %. Goal A1c < 7.0 %.   - Previously uncontrolled diabetes due to imperfect adherence to glycemic agents  - She is intolerant to Metformin  - Due to history of pancreatitis, GLP-1 RA and DPP-4 inhibitors are contra-indicated, will stop Januvia  - Will stop Glimepiride due to increased risk of hypoglycemia with insulin  - She is awaiting right shoulder sx and would like to have an A1c sooner, will check A1c on 02/05/2021 - She was advised to take the insulin 20-30 minutes before Breakfast and supper - Insulin analogues are cost prohibitive    MEDICATIONS: - STOP Amaryl  - STOP Januvia  - Continue Jardiance 25 mg, 1 tablet in the morning  - Change Novolin Mix to 45 units before Breakfast and 40 units Before Supper      EDUCATION / INSTRUCTIONS:  BG monitoring instructions: Patient is instructed to check her blood sugars 2 times a day, before breakfast and  supper .  Call [facility] if: BG persistently < 70 or > 300. . I reviewed the Rule of 15 for the treatment of hypoglycemia in detail with the patient. Literature supplied.   2) Diabetic complications:   Eye: Does not have known diabetic retinopathy.   Neuro/ Feet: Does not have known diabetic peripheral neuropathy.  Renal:  Patient does not  have known baseline CKD.    F/U in 3 months    Signed electronically by: Mack Guise, MD  [facility]  [facility] [address] [place], [place] [address] Phone: [phone] FAX: [phone]   CC: [address] [place] [address] Phone: [phone]  Fax: [phone]    Return to Endocrinology clinic as below: Future Appointments  Date Time Provider [place]  01/20/2021  1:00 PM [facility] PHARMACIST [facility]  01/27/2021 10:15 AM Leandrew Koyanagi, MD [facility] None  02/13/2021  8:20 AM Buford Dresser, MD [facility]

## 2021-01-17 NOTE — Patient Instructions (Addendum)
-   STOP Amaryl  - STOP Januvia  - Continue Jardiance 25 mg, 1 tablet in the morning  - Change Novolin Mix to 45 units before Breakfast and 40 units Before Supper        HOW TO TREAT LOW BLOOD SUGARS (Blood sugar LESS THAN 70 MG/DL)  Please follow the RULE OF 15 for the treatment of hypoglycemia treatment (when your (blood sugars are less than 70 mg/dL)    STEP 1: Take 15 grams of carbohydrates when your blood sugar is low, which includes:   3-4 GLUCOSE TABS  OR  3-4 OZ OF JUICE OR REGULAR SODA OR  ONE TUBE OF GLUCOSE GEL     STEP 2: RECHECK blood sugar in 15 MINUTES STEP 3: If your blood sugar is still low at the 15 minute recheck --> then, go back to STEP 1 and treat AGAIN with another 15 grams of carbohydrates.

## 2021-01-20 ENCOUNTER — Telehealth: Payer: Medicare HMO

## 2021-01-24 ENCOUNTER — Telehealth: Payer: Self-pay | Admitting: Pharmacist

## 2021-01-24 ENCOUNTER — Telehealth: Payer: Medicare HMO

## 2021-01-24 NOTE — Telephone Encounter (Signed)
Attempted CCM outreach today for 2nd time (initial out reach attempt 01/22/2021 at 9am) Unable to reach patient and unable to leave message on VM.  Will try again in 2 to 4 weeks.

## 2021-01-25 ENCOUNTER — Ambulatory Visit: Payer: Medicare HMO | Admitting: Orthopaedic Surgery

## 2021-01-26 ENCOUNTER — Telehealth: Payer: Self-pay | Admitting: Pharmacist

## 2021-01-26 NOTE — Progress Notes (Addendum)
Chronic Care Management Pharmacy Assistant   Name: [name]  MRN: [id number] DOB: 21-Dec-1945  Reason for Encounter: Medication Review/Medication Coordination Call.  Medications: Outpatient Encounter Medications as of 01/26/2021  Medication Sig   Alcohol Swabs (B-D SINGLE USE SWABS REGULAR) PADS USE AS DIRECTED   alendronate (FOSAMAX) 70 MG tablet Take with a full glass of water on an empty stomach.   ALPRAZolam (XANAX) 0.25 MG tablet Take 1 tablet by mouth three times a day if needed   amLODipine (NORVASC) 10 MG tablet Take 1 tablet (10 mg total) by mouth daily.   aspirin EC 81 MG tablet Take 81 mg by mouth daily.   Blood Glucose Calibration (TAI DOC CONTROL) NORMAL SOLN    Blood Glucose Monitoring Suppl (TRUE METRIX AIR GLUCOSE METER) w/Device KIT 1 each by Does not apply route 4 (four) times daily. 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   empagliflozin (JARDIANCE) 25 MG TABS tablet Take 1 tablet (25 mg total) by mouth daily before breakfast.   fenofibrate 160 MG tablet Take 1 tablet (160 mg total) by mouth daily.   ferrous sulfate 325 (65 FE) MG tablet Take 325 mg by mouth daily with breakfast.    Flaxseed, Linseed, (FLAXSEED OIL PO) Take 1 capsule by mouth 2 (two) times daily.    FLUoxetine (PROZAC) 40 MG capsule Take 1 capsule (40 mg total) by mouth daily.   fluticasone (FLONASE) 50 MCG/ACT nasal spray SHAKE LIQUID AND USE 2 SPRAYS IN EACH NOSTRIL DAILY   glucose blood (TRUE METRIX BLOOD GLUCOSE TEST) test strip USE AS DIRECTED 4 TIMES A DAY   HYDROcodone-acetaminophen (NORCO) 5-325 MG tablet Take 1-2 tablets by mouth daily as needed.   Insulin Pen Needle 32G X 4 MM MISC 1 Device by Does not apply route 2 (two) times daily.   Lancet Devices (LANCING DEVICE) MISC    levocetirizine (XYZAL) 5 MG tablet TAKE 1 TABLET(5 MG) BY MOUTH EVERY EVENING   lisinopril (ZESTRIL) 40 MG tablet Take 1 tablet (40  mg total) by mouth daily.   meloxicam (MOBIC) 7.5 MG tablet Take 1 tablet (7.5 mg total) by mouth daily as needed for pain.   metoprolol succinate (TOPROL-XL) 50 MG 24 hr tablet TAKE 1 TABLET(50 MG) BY MOUTH DAILY WITH FOOD   Multiple Vitamin (MULTIVITAMIN WITH MINERALS) TABS Take 1 tablet by mouth daily.   neomycin-polymyxin-hydrocortisone (CORTISPORIN) 3.5-10000-1 OTIC suspension Place 4 drops into the left ear 3 (three) times daily as needed.   NOVOLIN 70/30 FLEXPEN (70-30) 100 UNIT/ML KwikPen Inject 45 Units into the skin daily before breakfast AND 40 Units daily before supper.   Omega-3 Fatty Acids (FISH OIL) 1000 MG CAPS Take by mouth.   rosuvastatin (CRESTOR) 20 MG tablet Take 1 tablet (20 mg total) by mouth daily.   traMADol (ULTRAM) 50 MG tablet Take 1 tablet (50 mg total) by mouth 3 (three) times daily as needed.   TRUEplus Lancets 33G MISC USE AS DIRECTED 4 TIMES A DAY   vitamin E 400 UNIT capsule Take 400 Units by mouth daily.   No facility-administered encounter medications on file as of 01/26/2021.    Reviewed chart for medication changes ahead of medication coordination call.  No OVs, Consults, or hospital visits since last care coordination call.  Consults Visits: 01/17/21 Shamleffer, Melanie Crazier, MD. For Type 2 DM. STOPPED Glimepiride and Sitagliptin phosphate. CHANGED Novolin to  Inject 45 Units  into the skin daily before breakfast AND 40 Units daily before supper  No medication changes indicated.  BP Readings from Last 3 Encounters:  01/17/21 122/82  12/29/20 118/60  12/06/20 120/60    Lab Results  Component Value Date   HGBA1C 9.4 (H) 12/06/2020     Patient obtains medications through Adherence Packaging  90 Days   Last adherence delivery included: (30 DS) Glimepiride 2 mg ; one tablet morning  Januvia 100 mg ; one tablet morning Jardiance 25 mg ; one tablet morning Lisinopril 40 mg ; one tablet morning  Rosuvastatin 20 mg ; one tablet evening   Metoprolol Succinate ER 50 mg ; one tablet morning Fluoxetine 40 mg ; one tablet morning Fenofibrate 160 mg ; one tablet evening  Women's one daily multivitamin ; one tablet Morning  Omega-3 Fatty Acids 1,000 mg ; one capsule at morning and one at bedtime Vitamin E 400 mg ; one tablet morning Amlodipine 10 mg ; one tablet morning Alendronate 70 mg ; one tablet once a week Wednesday Morning  Aspirin 81 mg ; one tablet morning Ferrous Sulfate 325 mg ; one tablet morning   Patient declined meds last month: Pred Forte 1 % Trueplus pen needles  Novolin 70/30 Hydrocodone 5-325 mg; one to two tabs daily as needed for pain  Patient is due for next adherence delivery on: 02/02/21.  Called patient and reviewed medications and coordinated delivery.  This delivery to include: Jardiance 25 mg ; one tablet morning Lisinopril 40 mg ; one tablet morning  Rosuvastatin 20 mg ; one tablet evening  Metoprolol Succinate ER 50 mg ; one tablet morning Fluoxetine 40 mg ; one tablet morning Fenofibrate 160 mg ; one tablet evening  Women's one daily multivitamin ; one tablet Morning  Omega-3 Fatty Acids 1,000 mg ; one capsule at morning and one at bedtime Vitamin E 400 mg ; one tablet morning Amlodipine 10 mg ; one tablet morning Alendronate 70 mg ; one tablet once a week Wednesday Morning  Aspirin 81 mg ; one tablet morning Ferrous Sulfate 325 mg ; one tablet morning   Patient declined the following medications:  Glimepiride 2 mg ; one tablet morning (D/C) Januvia 100 mg ; one tablet morning (D/C) Pred Forte 1 % Trueplus pen needles  Novolin 70/30 Hydrocodone 5-325 mg; one to two tabs daily as needed for pain  Patient does not need refills at this time.  Confirmed delivery date of 02/02/21, advised patient that pharmacy will contact them the morning of delivery.  Follow-Up:Pharmacist Review   Charlann Lange, RMA Clinical Pharmacist Assistant [phone]  10 minutes spent in review,  coordination, and documentation.  Reviewed by: Beverly Milch, PharmD Clinical Pharmacist [facility] [phone]

## 2021-01-27 ENCOUNTER — Encounter: Payer: Self-pay | Admitting: Orthopaedic Surgery

## 2021-01-27 ENCOUNTER — Ambulatory Visit (INDEPENDENT_AMBULATORY_CARE_PROVIDER_SITE_OTHER): Payer: Worker's Compensation

## 2021-01-27 ENCOUNTER — Ambulatory Visit (INDEPENDENT_AMBULATORY_CARE_PROVIDER_SITE_OTHER): Payer: Worker's Compensation | Admitting: Orthopaedic Surgery

## 2021-01-27 VITALS — Ht 65.0 in | Wt 177.0 lb

## 2021-01-27 DIAGNOSIS — M545 Low back pain, unspecified: Secondary | ICD-10-CM | POA: Diagnosis not present

## 2021-01-27 MED ORDER — METHOCARBAMOL 500 MG PO TABS: 500.0000 mg | ORAL_TABLET | Freq: Two times a day (BID) | ORAL | 0 refills | Status: DC | PRN

## 2021-01-27 MED ORDER — PREDNISONE 5 MG (21) PO TBPK: ORAL_TABLET | ORAL | 0 refills | Status: DC

## 2021-01-27 NOTE — Progress Notes (Addendum)
Office Visit Note   Patient: [name]           Date of Birth: 1946/08/03           MRN: [id number] Visit Date: 01/27/2021              Requested by: [address], [place], [place] [address] [place],  [place] [address] PCP: [name], [name], DO   Assessment & Plan: Visit Diagnoses:  1. Acute low back pain, unspecified back pain laterality, unspecified whether sciatica present     Plan: Impression is chronic low back pain and bilateral lower extremity radiculopathy following work-related injury back in October.  At this point, we will start her on a 5 mg steroid taper and robaxin 500mg  bid prn as well as physical therapy.  She will follow-up with in two months.  Follow-Up Instructions: Return if symptoms worsen or fail to improve.   Orders:  Orders Placed This Encounter  Procedures  . XR Lumbar Spine 2-3 Views   No orders of the defined types were placed in this encounter.     Procedures: No procedures performed   Clinical Data: No additional findings.   Subjective: Chief Complaint  Patient presents with  . Right Shoulder - Pain  . Lower Back - Pain    HPI patient is a pleasant 75 year old female who presents to our clinic today with chronic low back pain.  This began following an injury while at work 08/01/2020 and patient states she is being filed under 08/03/2020.  The pain she has is to the center of her back and radiates down both buttocks and down both legs.  Pain is worse at night as well as with walking.  She has been taking Tylenol and tramadol without significant relief.  She does note occasional burning down her legs as well.  No prior history of back pathology.  Review of Systems as detailed in HPI.  All others reviewed and are negative.   Objective: Vital Signs: Ht 5\' 5"  (1.651 m)   Wt 177 lb (80.3 kg)   BMI 29.45 kg/m   Physical Exam well-developed well-nourished female in no acute distress.  Alert and  oriented x3.  Ortho Exam lumbar spine exam shows mild to moderate tenderness to the lower lumbar spinous and paraspinous musculature.  Positive straight leg raise both sides.  She has increased pain with rotation to the right and increased pain with lumbar flexion and extension.  No focal weakness.  She is neurovascular intact distally.  Specialty Comments:  No specialty comments available.  Imaging: XR Lumbar Spine 2-3 Views  Result Date: 01/27/2021 X-rays reveal moderate changes L4-5 and L5-S1 with rotation of the vertebrae    PMFS History: Patient Active Problem List   Diagnosis Date Noted  . Diabetes mellitus (HCC) 01/17/2021  . Type 2 diabetes mellitus with hyperglycemia, without long-term current use of insulin (HCC) 01/17/2021  . Acute pain of right shoulder 08/18/2020  . Primary osteoarthritis of right knee 08/18/2020  . Impacted cerumen of right ear 05/24/2020  . Bilateral impacted cerumen 05/17/2020  . Hyperlipidemia associated with type 2 diabetes mellitus (HCC) 03/10/2020  . Cough 03/10/2020  . Seasonal allergies 03/10/2020  . Elevated rheumatoid factor 07/21/2018  . Gastroenteritis 07/21/2018  . Trigger middle finger of left hand 12/19/2017  . Ganglion cyst of dorsum of right wrist 12/19/2017  . Morton's neuroma of right foot 12/19/2017  . Preventative health care 12/13/2017  .  Pain in both hands 12/13/2017  . Hyperlipidemia LDL goal <100 12/13/2017  . Abscess of upper gum 12/13/2017  . Spinal stenosis of cervical region 09/25/2016  . Cervical radiculopathy 09/06/2016  . Dyspnea 02/18/2016  . Chest pain 02/18/2016  . HLD (hyperlipidemia) 02/18/2016  . CAD in native artery 02/18/2016  . RLS (restless legs syndrome) 02/18/2016  . Pancreatitis 02/18/2016  . SOB (shortness of breath) 02/18/2016  . Uncontrolled type 2 diabetes mellitus with complication (HCC)   . Tobacco abuse   . Osteoarthritis of right knee 07/05/2015  . Smoking 07/02/2013  . Acute upper  respiratory infections of unspecified site 07/02/2013  . Flu vaccine need 07/02/2013  . Multinodular goiter 04/16/2013  . Bunion 07/07/2012  . BACK PAIN, THORACIC REGION, RIGHT 05/24/2009  . IRON DEFICIENCY 03/25/2009  . GERD 03/25/2009  . WEIGHT LOSS 03/25/2009  . SINUSITIS- ACUTE-NOS 11/25/2008  . Hyperlipidemia LDL goal <70 10/07/2008  . ACUTE PANCREATITIS 09/07/2008  . OVARIAN CYST 09/07/2008  . KNEE PAIN, RIGHT 05/24/2008  . CARPAL TUNNEL SYNDROME, RIGHT 05/12/2008  . Acute pain of both shoulders 04/02/2008  . ROTATOR CUFF REPAIR, RIGHT, HX OF 03/29/2008  . GOITER, MULTINODULAR 12/26/2007  . Depression 07/07/2007  . HIP PAIN, RIGHT 03/11/2007  . RESTLESS LEG SYNDROME, HX OF 03/11/2007  . Diabetes mellitus type II, uncontrolled (HCC) 03/07/2007  . Essential hypertension 03/07/2007   Past Medical History:  Diagnosis Date  . Blockage of coronary artery of heart (HCC)   . Depression   . Diabetes mellitus   . Hyperlipidemia   . Hypertension   . Pancreatitis   . Restless leg syndrome     Family History  Problem Relation Age of Onset  . Heart attack Father   . Heart attack Brother   . Diabetes Sister   . Cirrhosis Sister   . Hypertension Sister   . Cancer Mother   . Hypertension Mother   . Vision loss Mother   . Cancer Sister   . Sleep apnea Sister   . Heart Problems Sister   . Diabetes Sister   . Gout Sister   . Diabetes Son   . Heart attack Son   . Diabetes Daughter   . Goiter Daughter   . Diabetes Daughter     Past Surgical History:  Procedure Laterality Date  . CATARACT EXTRACTION, BILATERAL    . CORONARY ANGIOPLASTY WITH STENT PLACEMENT    . FOOT SURGERY Right    cyst removal  . ROTATOR CUFF REPAIR     right  . ROTATOR CUFF REPAIR Left   . TUBAL LIGATION     Social History   Occupational History  . Not on file  Tobacco Use  . Smoking status: Former Smoker    Packs/day: 0.20    Types: Cigarettes    Quit date: 05/05/2020    Years since  quitting: 0.7  . Smokeless tobacco: Never Used  Vaping Use  . Vaping Use: Never used  Substance and Sexual Activity  . Alcohol use: No  . Drug use: No  . Sexual activity: Not on file

## 2021-02-13 ENCOUNTER — Ambulatory Visit: Payer: Medicare HMO | Admitting: Cardiology

## 2021-02-13 NOTE — Progress Notes (Incomplete)
Cardiology Office Note:    Date:  02/13/2021   ID:  [name], DOB Aug 27, 1946, MRN [id number]  PCP:  Carollee Herter, Alferd Apa, DO  Cardiologist:  Buford Dresser, MD PhD  Referring MD: Carollee Herter, Alferd Apa, *   CC: follow up  History of Present Illness:    [name] is a 75 y.o. female with a hx of CAD s/p stent(s), type II diabetes on insulin, hyperlipidemia, hypertension who is seen for follow up today. She was initially seen 07/2018 as a new consult at the request of Ann Held, * for the evaluation and management of chest pain.  Cardiac history: history of cardiac stent(s)--not sure where, what type, or how many. Last cath in 2001 by Dr. Naomie Dean. She denies any symptoms at the time.  Had echo and lexiscan in 2017 when she was having fatigue and diaphoresis. No chest discomfort or shortness of breath at that time.   Risk factors: -Alcohol: none -Tobacco: sometimes; about a pack per week -Comorbidities: hypertension, hyperlipidemia, type II diabetes -Exercise level: sedentary many days, still works part time with minimal activity at work. Has intermittent activity, like 2 weeks ago when she helped her children cook for a family party. Doesn't think she can treadmill due to chronic knee pain. Does occasionally climb stairs.  -Family history: brother and father both died of MI.  Today: Doing well overall. No chest pain. Blood pressure well controlled today. Checks occasionally at home, has been in a similar range.  Denies chest pain, shortness of breath at rest or with normal exertion. No PND, orthopnea, LE edema or unexpected weight gain. No syncope or palpitations.  Past Medical History:  Diagnosis Date  . Blockage of coronary artery of heart ([place])   . Depression   . Diabetes mellitus   . Hyperlipidemia   . Hypertension   . Pancreatitis   . Restless leg syndrome     Past Surgical History:  Procedure Laterality Date  . CATARACT EXTRACTION,  BILATERAL    . CORONARY ANGIOPLASTY WITH STENT PLACEMENT    . FOOT SURGERY Right    cyst removal  . ROTATOR CUFF REPAIR     right  . ROTATOR CUFF REPAIR Left   . TUBAL LIGATION      Current Medications: Current Outpatient Medications on File Prior to Visit  Medication Sig  . Alcohol Swabs (B-D SINGLE USE SWABS REGULAR) PADS USE AS DIRECTED  . alendronate (FOSAMAX) 70 MG tablet Take with a full glass of water on an empty stomach.  . ALPRAZolam (XANAX) 0.25 MG tablet Take 1 tablet by mouth three times a day if needed  . amLODipine (NORVASC) 10 MG tablet Take 1 tablet (10 mg total) by mouth daily.  [name] aspirin EC 81 MG tablet Take 81 mg by mouth daily.  . Blood Glucose Calibration (TAI DOC CONTROL) NORMAL SOLN   . Blood Glucose Monitoring Suppl (TRUE METRIX AIR GLUCOSE METER) w/Device KIT 1 each by Does not apply route 4 (four) times daily. DX CODE E11.8  . Carboxymethylcellulose Sodium (THERATEARS OP) Place 1 drop into both eyes daily as needed (dry eyes).  . empagliflozin (JARDIANCE) 25 MG TABS tablet Take 1 tablet (25 mg total) by mouth daily before breakfast.  . fenofibrate 160 MG tablet Take 1 tablet (160 mg total) by mouth daily.  . ferrous sulfate 325 (65 FE) MG tablet Take 325 mg by mouth daily with breakfast.   . Flaxseed, Linseed, (FLAXSEED OIL PO) Take  1 capsule by mouth 2 (two) times daily.   [name] FLUoxetine (PROZAC) 40 MG capsule Take 1 capsule (40 mg total) by mouth daily.  . fluticasone (FLONASE) 50 MCG/ACT nasal spray SHAKE LIQUID AND USE 2 SPRAYS IN EACH NOSTRIL DAILY  . glucose blood (TRUE METRIX BLOOD GLUCOSE TEST) test strip USE AS DIRECTED 4 TIMES A DAY  . HYDROcodone-acetaminophen (NORCO) 5-325 MG tablet Take 1-2 tablets by mouth daily as needed.  . Insulin Pen Needle 32G X 4 MM MISC 1 Device by Does not apply route 2 (two) times daily.  Elmore Guise Devices (LANCING DEVICE) MISC   . levocetirizine (XYZAL) 5 MG tablet TAKE 1 TABLET(5 MG) BY MOUTH EVERY EVENING  . lisinopril  (ZESTRIL) 40 MG tablet Take 1 tablet (40 mg total) by mouth daily.  . meloxicam (MOBIC) 7.5 MG tablet Take 1 tablet (7.5 mg total) by mouth daily as needed for pain.  . methocarbamol (ROBAXIN) 500 MG tablet Take 1 tablet (500 mg total) by mouth 2 (two) times daily as needed.  . metoprolol succinate (TOPROL-XL) 50 MG 24 hr tablet TAKE 1 TABLET(50 MG) BY MOUTH DAILY WITH FOOD  . Multiple Vitamin (MULTIVITAMIN WITH MINERALS) TABS Take 1 tablet by mouth daily.  [name] neomycin-polymyxin-hydrocortisone (CORTISPORIN) 3.5-10000-1 OTIC suspension Place 4 drops into the left ear 3 (three) times daily as needed.  [name] NOVOLIN 70/30 FLEXPEN (70-30) 100 UNIT/ML KwikPen Inject 45 Units into the skin daily before breakfast AND 40 Units daily before supper.  . Omega-3 Fatty Acids (FISH OIL) 1000 MG CAPS Take by mouth.  . predniSONE (STERAPRED UNI-PAK 21 TAB) 5 MG (21) TBPK tablet Take as directed  . rosuvastatin (CRESTOR) 20 MG tablet Take 1 tablet (20 mg total) by mouth daily.  . traMADol (ULTRAM) 50 MG tablet Take 1 tablet (50 mg total) by mouth 3 (three) times daily as needed.  . TRUEplus Lancets 33G MISC USE AS DIRECTED 4 TIMES A DAY  . vitamin E 400 UNIT capsule Take 400 Units by mouth daily.   No current facility-administered medications on file prior to visit.     Allergies:   Clindamycin, Levemir [insulin detemir], and Metformin and related   Social History   Tobacco Use  . Smoking status: Former Smoker    Packs/day: 0.20    Types: Cigarettes    Quit date: 05/05/2020    Years since quitting: 0.7  . Smokeless tobacco: Never Used  Vaping Use  . Vaping Use: Never used  Substance Use Topics  . Alcohol use: No  . Drug use: No    Family History: The patient's family history includes Cancer in her mother and sister; Cirrhosis in her sister; Diabetes in her daughter, daughter, sister, sister, and son; Goiter in her daughter; Gout in her sister; Heart Problems in her sister; Heart attack in her brother,  father, and son; Hypertension in her mother and sister; Sleep apnea in her sister; Vision loss in her mother.  ROS:   Please see the history of present illness.   (+) Additional pertinent ROS negative except as documented.  EKGs/Labs/Other Studies Reviewed:    The following studies were reviewed today: 02/19/16 Echo Study Conclusions  - Left ventricle: The cavity size was normal. There was mild   concentric hypertrophy. Systolic function was normal. The   estimated ejection fraction was in the range of 60% to 65%. Wall   motion was normal; there were no regional wall motion   abnormalities. Doppler parameters are consistent with abnormal  left ventricular relaxation (grade 1 diastolic dysfunction).   Doppler parameters are consistent with elevated ventricular   end-diastolic filling pressure. - Aortic valve: There was no regurgitation. - Aortic root: The aortic root was normal in size. - Mitral valve: Structurally normal valve. There was mild   regurgitation. - Left atrium: The atrium was normal in size. - Right ventricle: The cavity size was normal. Wall thickness was   normal. Systolic function was normal. - Tricuspid valve: There was mild regurgitation. - Pulmonary arteries: Systolic pressure was within the normal   range. - Inferior vena cava: The vessel was normal in size. - Pericardium, extracardiac: There was no pericardial effusion.  02/19/16 NM Stress test-Lexiscan FINDINGS: Perfusion: Allowing for anterior apical breast attenuation, there is No significant decreased activity in the left ventricle on stress imaging to suggest reversible ischemia or infarction.  Wall Motion: Normal left ventricular wall motion. No left ventricular dilation.  Left Ventricular Ejection Fraction: 58 % End diastolic volume 83 m End systolic volume 35 ml  IMPRESSION: 1. No reversible ischemia or infarction. 2. Normal left ventricular wall motion. 3. Left ventricular  ejection fraction 58% 4. Low-risk stress test findings*.  EKG:  ECG personally reviewed. ECG dated 10/28/19 shows sinus bradycardia, RBBB, PRWP, borderline LAFB 02/13/2021:  Recent Labs: 12/06/2020: ALT 16; BUN 25; Creatinine, Ser 0.96; Hemoglobin 12.2; Platelets 182.0; Potassium 3.7; Sodium 136  Recent Lipid Panel    Component Value Date/Time   CHOL 118 12/06/2020 1603   TRIG 145.0 12/06/2020 1603   HDL 32.40 (L) 12/06/2020 1603   CHOLHDL 4 12/06/2020 1603   VLDL 29.0 12/06/2020 1603   LDLCALC 57 12/06/2020 1603   LDLCALC 61 06/15/2020 0819   LDLDIRECT 100.0 03/10/2020 1410    Physical Exam:    VS:  There were no vitals taken for this visit.    Wt Readings from Last 3 Encounters:  01/27/21 177 lb (80.3 kg)  01/17/21 177 lb 4 oz (80.4 kg)  12/29/20 177 lb 9.6 oz (80.6 kg)    GEN: Well nourished, well developed in no acute distress HEENT: Normal, moist mucous membranes NECK: No JVD CARDIAC: regular rhythm, normal S1 and S2, no rubs or gallops. No murmur. VASCULAR: Radial and DP pulses 2+ bilaterally. No carotid bruits RESPIRATORY:  Clear to auscultation without rales, wheezing or rhonchi  ABDOMEN: Soft, non-tender, non-distended MUSCULOSKELETAL:  Ambulates independently SKIN: Warm and dry, no edema NEUROLOGIC:  Alert and oriented x 3. No focal neuro deficits noted. PSYCHIATRIC:  Normal affect   ASSESSMENT:    No diagnosis found. PLAN:    History of CAD with stents, mixed hyperlipidemia: no angina today -continue aspirin 81 mg, atorvastatin 40 mg. -LDL goal <70 -per [facility], lipids 03/10/20: Tchol 171, HDL 37, LDL 100, TG 204. Changed from atorvastatin 40 mg to rosuvastatin 20 mg after these labs. Has a lab appt 06/15/20 to recheck.  -prior check was LDL 78. TG 173.  Type II diabetes: with ASCVD history -most recent A1c 9.4, from 10.1 -we discussed SGLT2i today. I believe she would get significant benefit from this. We discussed Coordinate-diabetes today, she is  amenable.  Hypertension: -on amlodipine 10 mg, lisinopril 40 mg, metoprolol succinate 50 mg  CV risk counseling and secondary prevention: -recommend heart healthy/Mediterranean diet, with whole grains, fruits, vegetable, fish, lean meats, nuts, and olive oil. Limit salt. -recommend moderate walking, 3-5 times/week for 30-50 minutes each session. Aim for at least 150 minutes.week. Goal should be pace of 3 miles/hours, or walking 1.5 miles  in 30 minutes -recommend avoidance of tobacco products. Avoid excess alcohol.  Plan for follow up: 6 mos or sooner as needed***  Medication Adjustments/Labs and Tests Ordered: Current medicines are reviewed at length with the patient today.  Concerns regarding medicines are outlined above.  No orders of the defined types were placed in this encounter.  No orders of the defined types were placed in this encounter.   There are no Patient Instructions on file for this visit.   I,Mathew Stumpf,acting as a Education administrator for [employer], MD.,have documented all relevant documentation on the behalf of Buford Dresser, MD,as directed by  Buford Dresser, MD while in the presence of Buford Dresser, MD.  ***  Signed, Buford Dresser, MD PhD 02/13/2021  [facility]

## 2021-02-15 DIAGNOSIS — Z006 Encounter for examination for normal comparison and control in clinical research program: Secondary | ICD-10-CM

## 2021-02-15 NOTE — Research (Addendum)
Marland KitchenMarland KitchenMarland KitchenMarland KitchenMarland KitchenMarland KitchenMarland Kitchen   COORDINATE-Diabetes 9 Month Patient Questionnaire (Intervention) Site #:   204   Patient ID: [id number]   9 MONTH QUESTIONNAIRE  Visit Date 02/15/21   Vital Status [x]   Patient Alive > Proceed to Visit Status []   Patient Dead > Complete Death Form only []   Unknown > Proceed to Visit Status  Visit Status Was the interview completed? []  No >IF NO, Select reason why [] Unable to locate                                    [] No Valid Contacts (patients or alternates) [] Multiple attempts to valid contacts []  Patient no longer cared for at study clinic []  Patient withdrew []  Other, specify:                                           >IF NO, Last date of contact: / /             MM      DD        YYYY    [x] Yes >IF YES, Select source of Interview: []   Proxy [x]   Patient     9 Month Patient Questionnaire (Intervention)  Instructions:   1. Prior to speaking with the patient either over the phone or in person, print off or have available the patient's current list of medications.      IF conducting follow-up visit over the phone - Instruct the patient to gather all their pill bottles. 2. For the purpose of the COORDINATE-Diabetes Study, please document whether the patient is currently taking each of the following medication classes. 3. DO NOT PROMPT DURING FOLLOW-UP VISIT: IF subject mentions reaction to one of the following medications, complete the AE/SAE section and follow instructions in operations manual regarding Adverse event reporting to [employer] ([facility]).  MEDICATIONS  Medication Are you currently taking [insert name of previous med]? If currently taking: If started since last visit: If stopped since last visit:  ACE Inhibitor / Angiotensin Receptor Blocker (ARB) / Angiotensin Receptor Neprilysin inhibitor (ARNi) [] No >  [x] Yes > [] Stopped since last visit [] Never prescribed [] Started since last visit [x] Same medication as last     visit [] Different medication  than       last visit [Study personnel to answer]  Documented in EHR? [] No  [x] Yes   If no, Verified by: [] Photo of bottle [] Copy of rx [] Dispensing       pharmacy [] Prescribing provider [] Other (specify):                    Date started:        / /             MM DD YYYY Who prescribed this medication for you? [] Cardiology provider > [] Study clinic [] Outside clinic [] Endocrinology provider [] Primary care provider [] Other provider > Specify:                             [] Unknown Date discontinued:        / /             MM DD YYYY Why did you stop taking this medication? (check all that apply) [] Allergic reaction [] Medication side effects [] Unable to  adhere/monitor [] Had an      operation/procedure      that required      stopping it [] Unable to afford it [] No longer wants        to take        this medication [] Provider decision [] Pregnancy [] Other (specify: ) [] Unknown Reason   If started or changed  What medication are you taking now? [] Benazepril (Lotensin) [] Captopril (Capoten) [] Enalapril (Vasotec) [] Fosinopril (Monopril) [] Lisinopril (Zestril, Prinivil) [] Quinapril (Accupril) [] Ramipril (Altace) [] Azilsartan (Edarbi) [] Candesartan (Atacand) [] Irbesartan (Avapro) [] Losartan (Cozaar) [] Olmesartan (Benicar) [] Telmisartan (Micardis) [] Valsartan (Diovan) [] Sacrubitril/Valsartan )      Medication Are you currently taking [insert name of previous med]? If currently taking: If started since last visit: If stopped since last visit:  Statin []  No >  [x] Yes > [] Stopped since last visit [] Never prescribed [] Started since last visit [x] Same medication as last        visit [] Different medication or dose     than last visit [Study personnel to answer]  Documented in EHR? [] No [x] Yes    If no, Verified by: [] Photo of bottle [] Copy of rx [] Dispensing pharmacy [] Prescribing provider [] Other (specify):                     Date started:        / /             MM DD YYYY Who prescribed this medication for you? [] Cardiology provider  [] Study clinic [] Outside clinic [] Endocrinology provider [] Primary care provider [] Other provider  Specify:                             [] Unknown Date discontinued:        / /             MM DD YYYY Why did you stop taking this medication? (check all that apply) [] Allergic reaction [] Medication side effects  [] Muscle aches [] Weakness [] Joint pain [] Cognitive symptoms [] Other (specify):                          [] Unable to        adhere/monitor [] Had an    operation/procedure    that required stopping it [] Unable to afford it [] No longer wants       to take this            medication [] Provider decision [] Pregnancy [] Other (specify: ) [] Unknown Reason   If started or changed  What medication are you taking now? [] Atorvastatin (Lipitor) [] Fluvastatin (Lescol) [] Lovastatin (Mevacor) [] Pravastatin (Pravachol) [] Rosuvastatin (Crestor) [] Simvastatin (Zocor) [] Pitatavastatin (Livalo)  Dose: [] 1 mg  [] 10 mg [] 2 mg  []  20 mg [] 3 mg  []  40 mg [] 4 mg  []  60 mg [] 5 mg  []  80 mg  Frequency: [] Daily []  Less than daily      Medication Are you currently taking [insert name of previous med]? If currently taking: If started since last visit: If stopped since last visit:  SGLT2 Inhibitor [] No   [x] Yes [] Stopped since last visit [] Never prescribed [x] Started since last visit [] Same medication as last      visit [] Different medication than last visit [Study personnel to answer]  Documented in EHR? [] No [x] Yes   If no, Verified by: [] Photo of bottle [] Copy of rx [] Dispensing       pharmacy [] Prescribing       provider [] Other (specify):  Date started: 01/17/2021 Who prescribed this medication for you? [] Cardiology provider  [] Study clinic [] Outside clinic [x] Endocrinology provider [] Primary care provider [] Other  provider  Specify:                             [] Unknown Date discontinued:        / /             MM DD YYYY Why did you stop taking this medication? (check all that apply) [] Allergic reaction [] Medication side effects [] Unable to       adhere/monitor [] Had an        operation/procedure         that required           stopping it [] Patient unable to       afford it [] Patient no longer       wants to       take this medication [] Provider decision [] Pregnancy [] Other (specify: ) [] Unknown Reason   If started or changed  What medication are you taking now? [] Canaglifozin (Invokana) [] Dapagliflozin (Farxiga) [x] Empaglifozin (Jardiance) [] Ertugliflozin (Steglatro)     GLP1 Receptor Agonist [x] No   [] Yes [] Stopped since last visit [x] Never prescribed [] Started since last visit [] Same medication as last      visit [] Different medication than       last visit [Study personnel to answer]  Documented in EHR? [] No [] Yes   If no, Verified by: [] Photo of bottle [] Copy of rx [] Dispensing       pharmacy [] Prescribing       provider [] Other (specify):                    Date started:        / /             MM DD YYYY Who prescribed this medication for you? [] Cardiology provider  [] Study clinic [] Outside clinic [] Endocrinology provider [] Primary care provider [] Other provider  Specify:                             [] Unknown Date discontinued:        / /             MM DD YYYY Why did you stop taking this medication? (check all that apply) [] Allergic reaction [] Medication side effects [] Unable to      adhere/monitor [] Had an    operation/procedure    that required stopping it [] Patient unable to        afford it [] Patient no longer        wants to take this        medication [] Provider decision [] Pregnancy [] Other (specify: ) [] Unknown Reason   If started or changed  What medication are you taking now? [] Albiglutide (Tanzeum) [] Dulaglutide  (Trulicity) [] Exanatide (Byetta, Bydureon) [] Liraglutide (Victoza, Saxenda) [] Lixisenatide (Adlyxin) [] Semaglutice (Ozempic)     12/16/2018 ([facility]C Release)

## 2021-02-16 ENCOUNTER — Telehealth: Payer: Self-pay | Admitting: Family Medicine

## 2021-02-16 ENCOUNTER — Other Ambulatory Visit (INDEPENDENT_AMBULATORY_CARE_PROVIDER_SITE_OTHER): Payer: Medicare HMO

## 2021-02-16 ENCOUNTER — Other Ambulatory Visit: Payer: Self-pay

## 2021-02-16 DIAGNOSIS — E1165 Type 2 diabetes mellitus with hyperglycemia: Secondary | ICD-10-CM

## 2021-02-16 LAB — HEMOGLOBIN A1C: Hgb A1c MFr Bld: 8.4 % — ABNORMAL HIGH (ref 4.6–6.5)

## 2021-02-16 NOTE — Telephone Encounter (Signed)
Noted. Lab have just been collected not resulted yet.

## 2021-02-16 NOTE — Telephone Encounter (Signed)
Pt is requesting that her lab  Results from today be faxed to her nurse case worker at [employer] [phone]

## 2021-02-17 ENCOUNTER — Telehealth: Payer: Self-pay

## 2021-02-17 ENCOUNTER — Encounter: Payer: Self-pay | Admitting: *Deleted

## 2021-02-17 NOTE — Telephone Encounter (Signed)
Patient called regarding her A1C reading she is requesting a call back:[phone]

## 2021-02-17 NOTE — Telephone Encounter (Signed)
Faxed as requested

## 2021-02-17 NOTE — Telephone Encounter (Signed)
Patient have been notified that A1C results 8.4

## 2021-02-20 ENCOUNTER — Telehealth: Payer: Self-pay

## 2021-02-20 NOTE — Telephone Encounter (Signed)
Looks like pcp must have ordered this and they notified patient already according to telephone messages in chart

## 2021-02-20 NOTE — Telephone Encounter (Signed)
Patient states her A1C is an 8.4. Would like to know when she can have shoulder SU.

## 2021-02-20 NOTE — Telephone Encounter (Signed)
Patient called she stated she has a couple of questions to ask regarding a case Dr.Xu Is working on? she is requesting a call [phone]

## 2021-02-21 NOTE — Telephone Encounter (Signed)
Once it is 8.0 or less

## 2021-02-22 NOTE — Telephone Encounter (Signed)
Tried calling patient, no answer and voicemail is full.

## 2021-02-23 ENCOUNTER — Other Ambulatory Visit: Payer: Self-pay | Admitting: Family Medicine

## 2021-02-23 ENCOUNTER — Other Ambulatory Visit: Payer: Self-pay | Admitting: Pharmacist

## 2021-02-23 ENCOUNTER — Ambulatory Visit (INDEPENDENT_AMBULATORY_CARE_PROVIDER_SITE_OTHER): Payer: Medicare HMO | Admitting: Pharmacist

## 2021-02-23 DIAGNOSIS — Z794 Long term (current) use of insulin: Secondary | ICD-10-CM | POA: Diagnosis not present

## 2021-02-23 DIAGNOSIS — E1169 Type 2 diabetes mellitus with other specified complication: Secondary | ICD-10-CM | POA: Diagnosis not present

## 2021-02-23 DIAGNOSIS — E785 Hyperlipidemia, unspecified: Secondary | ICD-10-CM

## 2021-02-23 DIAGNOSIS — I2510 Atherosclerotic heart disease of native coronary artery without angina pectoris: Secondary | ICD-10-CM

## 2021-02-23 DIAGNOSIS — F419 Anxiety disorder, unspecified: Secondary | ICD-10-CM

## 2021-02-23 DIAGNOSIS — I10 Essential (primary) hypertension: Secondary | ICD-10-CM | POA: Diagnosis not present

## 2021-02-23 DIAGNOSIS — G8929 Other chronic pain: Secondary | ICD-10-CM

## 2021-02-23 DIAGNOSIS — E1159 Type 2 diabetes mellitus with other circulatory complications: Secondary | ICD-10-CM | POA: Diagnosis not present

## 2021-02-23 MED ORDER — ALPRAZOLAM 0.25 MG PO TABS
ORAL_TABLET | ORAL | 0 refills | Status: DC
Start: 2021-02-23 — End: 2021-02-28

## 2021-02-23 NOTE — Chronic Care Management (AMB) (Signed)
Chronic Care Management Pharmacy Note  02/26/2021 Name:  [name] MRN:  [id number] DOB:  [birth year]  Subjective: [name] is an 75 y.o. year old female who is a primary patient of Ann Held, DO.  The CCM team was consulted for assistance with disease management and care coordination needs.    Engaged with patient by telephone for follow up visit in response to provider referral for pharmacy case management and/or care coordination services.   Consent to Services:  The patient was given information about Chronic Care Management services, agreed to services, and gave verbal consent prior to initiation of services.  Please see initial visit note for detailed documentation.   Patient Care Team: Carollee Herter, Alferd Apa, DO as PCP - General Buford Dresser, MD as PCP - Cardiology (Cardiology) Ashok Pall, MD as Consulting Physician (Neurosurgery) Monna Fam, MD as Consulting Physician (Ophthalmology) Cherre Robins, PharmD (Pharmacist) [facility], Melanie Crazier, MD as Consulting Physician (Endocrinology)  Recent office visits: 12/29/2020 - PCP (Dr Etter Sjogren) f/u DM; Not controlled. Referred to endo. Increased Jardiance to 59m daily.  Recent consult visits: 01/27/2021 - Ortho (Dr XErlinda Hong: acute low back pain. Stated prednisone 53mdose pack and methocarbamol 50054mid prn.  01/17/2021- Endo (Dr ShaKelton Pillarnitial endocrinology visit. H/o pancreatitis. Januvia stopped. Due to history of pancreatitis, GLP-1 RA and DPP-4 inhibitors are contra-indicated. Stopped glimepiride due to increased risk of hypoglycemia with insulin. Continue Jardiance 25 mg, 1 tablet in the morning. Change Novolin Mix to 45 units before Breakfast and 40 units Before Supper. Patient is instructed to check blood sugars 2 times a day, before breakfast and.   Hospital visits: ED 09/23/2020 for CAP Objective:  Lab Results  Component Value Date   CREATININE 0.96 12/06/2020    CREATININE 0.82 09/23/2020   CREATININE 1.04 (H) 06/15/2020    Lab Results  Component Value Date   HGBA1C 8.4 (H) 02/16/2021   Last diabetic Eye exam:  Lab Results  Component Value Date/Time   HMDIABEYEEXA Retinopathy (A) 12/30/2020 12:00 AM    Last diabetic Foot exam: No results found for: HMDIABFOOTEX  - Done 12/29/2020 by Dr ShaKelton Pillar   Component Value Date/Time   CHOL 118 12/06/2020 1603   TRIG 145.0 12/06/2020 1603   HDL 32.40 (L) 12/06/2020 1603   CHOLHDL 4 12/06/2020 1603   VLDL 29.0 12/06/2020 1603   LDLCALC 57 12/06/2020 1603   LDLCALC 61 06/15/2020 0819   LDLDIRECT 100.0 03/10/2020 1410    Hepatic Function Latest Ref Rng & Units 12/06/2020 09/23/2020 06/15/2020  Total Protein 6.0 - 8.3 g/dL 7.2 7.5 6.9  Albumin 3.5 - 5.2 g/dL 4.4 4.5 -  AST 0 - 37 U/L '16 21 18  ' ALT 0 - 35 U/L '16 22 17  ' Alk Phosphatase 39 - 117 U/L 38(L) 43 -  Total Bilirubin 0.2 - 1.2 mg/dL 0.5 0.6 0.4  Bilirubin, Direct 0.0 - 0.3 mg/dL - - -    Lab Results  Component Value Date/Time   TSH 0.58 08/11/2018 02:34 PM   TSH 0.57 07/16/2018 09:50 AM   FREET4 0.74 08/11/2018 02:34 PM   FREET4 0.84 04/16/2013 11:19 AM    CBC Latest Ref Rng & Units 12/06/2020 09/23/2020 08/11/2018  WBC 4.0 - 10.5 K/uL 7.0 13.9(H) 11.2(H)  Hemoglobin 12.0 - 15.0 g/dL 12.2 13.5 12.3  Hematocrit 36.0 - 46.0 % 36.6 40.1 36.9  Platelets 150.0 - 400.0 K/uL 182.0 215 221.0    No results found for: VD25OH  Clinical ASCVD: Yes  The ASCVD Risk score Mikey Bussing DC Jr., et al., 2013) failed to calculate for the following reasons:   The valid total cholesterol range is 130 to 320 mg/dL    Other: DEXA 05/29/2017 Forearm Radius 33% T-score = -2.5.  DualFemur Neck Left  T-Score = -2.0; Which was a statistically significant increase in BMD of the left hip since prior exam dated 12/17/2013.  Social History   Tobacco Use  Smoking Status Former Smoker  . Packs/day: 0.20  . Types: Cigarettes  . Quit date: 05/05/2020  . Years  since quitting: 0.8  Smokeless Tobacco Never Used  Tobacco Comment   quit in 2021   BP Readings from Last 3 Encounters:  01/17/21 122/82  12/29/20 118/60  12/06/20 120/60   Pulse Readings from Last 3 Encounters:  12/29/20 68  12/06/20 (!) 55  09/23/20 75   Wt Readings from Last 3 Encounters:  01/27/21 177 lb (80.3 kg)  01/17/21 177 lb 4 oz (80.4 kg)  12/29/20 177 lb 9.6 oz (80.6 kg)    Assessment: Review of patient past medical history, allergies, medications, health status, including review of consultants reports, laboratory and other test data, was performed as part of comprehensive evaluation and provision of chronic care management services.   SDOH:  (Social Determinants of Health) assessments and interventions performed:  SDOH Interventions   Flowsheet Row Most Recent Value  SDOH Interventions   Financial Strain Interventions --  [plan to assist patient in applying for assistance with Jardiance]  Physical Activity Interventions Other (Comments)  [limited by ortho issue - awaiting shoulder surgery]      CCM Care Plan  Allergies  Allergen Reactions  . Metformin And Related Other (See Comments)    Sick on stomach, sweaty, throw up, pain  . Levemir [Insulin Detemir] Hives and Swelling  . Clindamycin Nausea And Vomiting    Medications Reviewed Today    Reviewed by Cherre Robins, PharmD (Pharmacist) on 02/23/21 at 1342  Med List Status: <None>  Medication Order Taking? Sig Documenting Provider Last Dose Status Informant  Alcohol Swabs (B-D SINGLE USE SWABS REGULAR) PADS [id number] No USE AS DIRECTED Carollee Herter, Alferd Apa, DO Taking Active   alendronate (FOSAMAX) 70 MG tablet [id number] No Take with a full glass of water on an empty stomach. Ann Held, DO Taking Active   ALPRAZolam Duanne Moron) 0.25 MG tablet [id number] No Take 1 tablet by mouth three times a day if needed Roma Schanz R, DO Taking Active   amLODipine (NORVASC) 10 MG tablet [id number] No Take 1  tablet (10 mg total) by mouth daily. Ann Held, DO Taking Active   aspirin EC 81 MG tablet [id number] No Take 81 mg by mouth daily. [provider] Taking Active Self  Blood Glucose Calibration (TAI DOC CONTROL) NORMAL SOLN [id number] No  [provider] Taking Active Self  Blood Glucose Monitoring Suppl (TRUE METRIX AIR GLUCOSE METER) w/Device KIT [id number] No 1 each by Does not apply route 4 (four) times daily. DX CODE E11.8 Ann Held, DO Taking Active   Carboxymethylcellulose Sodium (THERATEARS OP) [id number] No Place 1 drop into both eyes daily as needed (dry eyes). [provider] Taking Active Self  empagliflozin (JARDIANCE) 25 MG TABS tablet [id number] No Take 1 tablet (25 mg total) by mouth daily before breakfast. Shamleffer, Melanie Crazier, MD Taking Active   fenofibrate 160 MG tablet [id number] No Take 1 tablet (160 mg total) by mouth daily. Carollee Herter,  Alferd Apa, DO Taking Active   ferrous sulfate 325 (65 FE) MG tablet [id number] No Take 325 mg by mouth daily with breakfast.  [provider] Taking Active Self  Flaxseed, Linseed, (FLAXSEED OIL PO) [id number] No Take 1 capsule by mouth 2 (two) times daily.  [provider] Taking Active Self  FLUoxetine (PROZAC) 40 MG capsule [id number] No Take 1 capsule (40 mg total) by mouth daily. Carollee Herter, Kendrick Fries R, DO Taking Active   fluticasone [facility]) 50 MCG/ACT nasal spray [id number] No SHAKE LIQUID AND USE 2 SPRAYS IN [facility] NOSTRIL DAILY Roma Schanz R, DO Taking Active   glucose blood (TRUE METRIX BLOOD GLUCOSE TEST) test strip [id number] No USE AS DIRECTED 4 TIMES A DAY Lowne Chase, Yvonne R, DO Taking Active   HYDROcodone-acetaminophen (NORCO) 5-325 MG tablet [id number] No Take 1-2 tablets by mouth daily as needed.  Patient not taking: Reported on 02/23/2021   Aundra Dubin, PA-C Not Taking Active   Insulin Pen Needle 32G X 4 MM MISC [id number] No 1 Device by Does not apply  route 2 (two) times daily. Shamleffer, Melanie Crazier, MD Taking Active   Lancet Devices (LANCING DEVICE) [place] [id number] No  [provider] Taking Active Self  levocetirizine (XYZAL) 5 MG tablet [id number] No TAKE 1 TABLET(5 MG) BY MOUTH EVERY EVENING Roma Schanz R, DO Taking Active   lisinopril (ZESTRIL) 40 MG tablet [id number] No Take 1 tablet (40 mg total) by mouth daily. Buford Dresser, MD Taking Active   meloxicam [facility]) 7.5 MG tablet [id number] No Take 1 tablet (7.5 mg total) by mouth daily as needed for pain.  Patient not taking: Reported on 02/23/2021   Ann Held, DO Not Taking Active   methocarbamol (ROBAXIN) 500 MG tablet [id number] No Take 1 tablet (500 mg total) by mouth 2 (two) times daily as needed.  Patient not taking: Reported on 02/23/2021   Aundra Dubin, PA-C Not Taking Active   metoprolol succinate (TOPROL-XL) 50 MG 24 hr tablet [id number] No TAKE 1 TABLET(50 MG) BY MOUTH DAILY WITH FOOD Carollee Herter, Alferd Apa, DO Taking Active   Multiple Vitamin (MULTIVITAMIN WITH MINERALS) TABS [id number] No Take 1 tablet by mouth daily. [provider] Taking Active Self  NOVOLIN 70/30 FLEXPEN (70-30) 100 UNIT/ML KwikPen [id number] No Inject 45 Units into the skin daily before breakfast AND 40 Units daily before supper. Shamleffer, Melanie Crazier, MD Taking Active   Omega-3 Fatty Acids (FISH OIL) 1000 MG CAPS [id number] No Take by mouth. [provider] Taking Active            Med Note Elana Alm, [place] Rounds Oct 14, 2020  9:11 AM)    rosuvastatin (CRESTOR) 20 MG tablet [id number] No Take 1 tablet (20 mg total) by mouth daily. Ann Held, DO Taking Active   traMADol (ULTRAM) 50 MG tablet [id number] No Take 1 tablet (50 mg total) by mouth 3 (three) times daily as needed. Aundra Dubin, PA-C Taking Active   TRUEplus Lancets 33G MISC [id number] No USE AS DIRECTED 4 TIMES A DAY Ann Held, DO Taking Active   vitamin E 400 UNIT  capsule [id number] No Take 400 Units by mouth daily. [provider] Taking Active Self          Patient Active Problem List   Diagnosis Date Noted  . Diabetes mellitus ([place]) 01/17/2021  . Type 2 diabetes mellitus with hyperglycemia, without long-term current use of insulin ([place]) 01/17/2021  .  Acute pain of right shoulder 08/18/2020  . Primary osteoarthritis of right knee 08/18/2020  . Impacted cerumen of right ear 05/24/2020  . Bilateral impacted cerumen 05/17/2020  . Hyperlipidemia associated with type 2 diabetes mellitus ([place]) 03/10/2020  . Cough 03/10/2020  . Seasonal allergies 03/10/2020  . Elevated rheumatoid factor 07/21/2018  . Gastroenteritis 07/21/2018  . Trigger middle finger of left hand 12/19/2017  . Ganglion cyst of dorsum of right wrist 12/19/2017  . Morton's neuroma of right foot 12/19/2017  . Preventative health care 12/13/2017  . Pain in both hands 12/13/2017  . Hyperlipidemia LDL goal <100 12/13/2017  . Abscess of upper gum 12/13/2017  . Spinal stenosis of cervical region 09/25/2016  . Cervical radiculopathy 09/06/2016  . Dyspnea 02/18/2016  . Chest pain 02/18/2016  . HLD (hyperlipidemia) 02/18/2016  . CAD in native artery 02/18/2016  . RLS (restless legs syndrome) 02/18/2016  . Pancreatitis 02/18/2016  . SOB (shortness of breath) 02/18/2016  . Uncontrolled type 2 diabetes mellitus with complication ([place])   . Tobacco abuse   . Osteoarthritis of right knee 07/05/2015  . Smoking 07/02/2013  . Acute upper respiratory infections of unspecified site 07/02/2013  . Flu vaccine need 07/02/2013  . Multinodular goiter 04/16/2013  . Bunion 07/07/2012  . BACK PAIN, THORACIC REGION, RIGHT 05/24/2009  . IRON DEFICIENCY 03/25/2009  . GERD 03/25/2009  . WEIGHT LOSS 03/25/2009  . SINUSITIS- ACUTE-NOS 11/25/2008  . Hyperlipidemia LDL goal <70 10/07/2008  . ACUTE PANCREATITIS 09/07/2008  . OVARIAN CYST 09/07/2008  . KNEE PAIN, RIGHT 05/24/2008  . CARPAL  TUNNEL SYNDROME, RIGHT 05/12/2008  . Acute pain of both shoulders 04/02/2008  . ROTATOR CUFF REPAIR, RIGHT, HX OF 03/29/2008  . GOITER, MULTINODULAR 12/26/2007  . Depression 07/07/2007  . HIP PAIN, RIGHT 03/11/2007  . RESTLESS LEG SYNDROME, HX OF 03/11/2007  . Diabetes mellitus type II, uncontrolled ([place]) 03/07/2007  . Essential hypertension 03/07/2007    Immunization History  Administered Date(s) Administered  . Fluad Quad(high Dose 65+) 07/28/2019  . Influenza Split 08/08/2012  . Influenza, High Dose Seasonal PF 08/09/2014, 07/05/2015, 07/21/2018  . PFIZER(Purple Top)SARS-COV-2 Vaccination 12/07/2019, 01/04/2020  . PPD Test 08/27/2011, 09/04/2011, 07/13/2014, 09/23/2015  . Pneumococcal Conjugate-13 08/09/2014  . Pneumococcal Polysaccharide-23 11/17/2013  . Td 03/11/2007  . Zoster 03/11/2007    Conditions to be addressed/monitored: CAD, HTN, HLD, DMII, Anxiety, Depression and shoulder pain; OA of knee; osteoporosis; h/o pancreatitis  Care Plan : General Pharmacy (Adult)  Updates made by Cherre Robins, PHARMD since 02/26/2021 12:00 AM    Problem: Chronic Disease Management support, education, and care coordination needs related to HTN, Hyperlipidemia; CAD, Diabetes, Depression/Anxiety, Osteoporosis, Neuropathy   Priority: High  Onset Date: 02/23/2021  Note:   Current Barriers:  . Unable to independently afford treatment regimen . Unable to achieve control of type 2 DM  . Does not adhere to prescribed medication regimen . Chronic Disease Management support, education, and care coordination needs related to HTN, Hyperlipidemia, CAD, Diabetes, Depression/Anxiety, Osteoporosis, Neuropathy  Pharmacist Clinical Goal(s):  [name] Over the next 90 days, patient will verbalize ability to afford treatment regimen . achieve control of type 2 DM as evidenced by A1c <7.0% . maintain control of HTN and lipids as evidenced by BP <130/80 (per cardio) and LDL <70  . adhere to prescribed medication  regimen as evidenced by patient report of taking medications daily  through collaboration with PharmD and provider.   Interventions: . 1:1 collaboration with Carollee Herter, Alferd Apa, DO regarding development and  update of comprehensive plan of care as evidenced by provider attestation and co-signature . Inter-disciplinary care team collaboration (see longitudinal plan of care) . Comprehensive medication review performed; medication list updated in electronic medical record  Hypertension . Controlled; BP goal <130/80 . Patient checks BP at home infrequently - usually once per week - last reading was 134/80  . Patient has failed these meds in the past: None noted  . Current regimen:  . Amlodipine 16m daily . Lisinopril 458mdaily . Metoprolol Succinate 5049maily . Interventions: o Requested patient to check her blood pressure 2 times per week and record o Ensure daily salt intake < 2300 mg/day o Continue current regimen for blood pressure  Hyperlipidemia / CAD Controlled; LDL goal < 70 . Current regimen:  . Aspirin 25m66mily . Fenofibrate 160mg18mly . Omega 3 fish Oil 1030mg 21me daily . Rosuvastatin 20mg d69m . Interventions: o Reviewed last lipid panel with patient. Has maintained LDL at goal and Tg improved form 344 in 2021 to 145 on 12/06/2020 o Continue current regimen for heart health and cholesterol o Continue to follow low fat diet  Diabetes Lab Results  Component Value Date/Time   HGBA1C 8.4 (H) 02/16/2021 09:37 AM   HGBA1C 9.4 (H) 12/06/2020 04:03 PM   . A1c not at goal. Initially goal is to get A1c <8.0 so patient can have shoulder surgery but eventually would like A1c goal <7%as long as doesn't increase hypoglycemia . Current regimen:  . Humulin 70/30 - 45 units with breakfast and 40 units with evening meal . Jardiance 25mg - 17m 1 tablet each morning . Past medications - Januvia 100mg dai55mnd glimepiride 2mg qam (63mcontinue by Dr ShamlefferKelton Pillaristory  of pancreatitis . Recent home BG reading:  o 2 readings last week in 220's (occurred when she misses doses of Jardiance)  o Lowest reading was last night when BG was 80 - patient states she felt a little shaky so she ate canned mango and drank juice it was in.  . Patient reports she is in Medicare coverage gap for Jardiance. Jardiance also is no longer included in her morning packaged medications but in a separate bottle. She reports this has caused her to miss doses of Jardiance about twice per week. . Interventions: o Discussed DM goals - Fasting (before meal blood sugar): 80-130 - Post-Prandial (2 hrs after eating blood sugar): less than 180 - A1c (3 month blood sugar average): less than 7% o Requested patient to check blood glucose twice daily o Mailed patient assistance application for Jardiance since patient reports she is in Medicare coverage gap. Will coordinate with patient and Dr ShamlefferKelton Pillarte and submit PAP application o Collaborate with [facility] to find solution to having Jardiance included in medication packaging again to improved adherence.   Depression / anxiety . Patient report anxiety and mood are controlled with current regimen . Using alprazolam sparingly - last RF was 09/07/2020 for #60 tabs . Patient has failed these meds in past: sertraline (palpitations) . Current regimen:  . Alprazolam 0.25mg three21mes daily as needed  . Fluoxetine 40mg daily 18mterventions: o Collaboration with provider regarding medication management (refills needed for alprazolam) - message sent to request refill. o Maintain current regimen for depression and anxiety  Osteoporosis .  Last DEXA Scan: 05/29/2017 .             T-Score femoral neck: -2.0 .  T-Score forearm radius: -2.5 . Current regimen:  o Alendronate 76m weekly . Interventions: o Consider repeat DEXA Scan o Continue current regimen  Shoulder Pain / osteoarthritis of right knee:  .Marland KitchenSurgery is  planned once A1c is < 8.0% . Current regimen:  o Tramadol 538mup to 3 times a day if needed for pain . Interventions: o Discuss importance of taking diabetes meds daily and getting A1c <8.0 so surgery can be scheduled.  o Updated medication list to include tramadol and also removed methocarbamol which patient is not longer taking o Continue to follow up with orthopedic office for pain management   Medication management . Pharmacist Clinical Goal(s): o Over the next 90 days, patient will work with PharmD and providers to maintain optimal medication adherence . Current pharmacy: [facility] . Interventions o Comprehensive medication review performed. o Utilize [facility] for medication synchronization, packaging and delivery o Will coordinate with [facility] to get Jardiance added back to medication pill packs  Patient Goals/Self-Care Activities . Over the next 90 days, patient will:  take medications as prescribed, check glucose 2 times per day, document, and provide at future appointments, and collaborate with provider on medication access solutions  Follow Up Plan: Telephone follow up appointment with care management team member scheduled for:  3 months  with clinical pharmacist;  Planned phone outreach by clinical pharmacist assistant in 1 month to check in PAP application and home BG readings.          Medication Assistance: mailed patient application for Jardiance patient assistance.   Patient's preferred pharmacy is:  [facility] [place] [address] [place]: [phone]: [address] [address] [place] SWChrist[facility]5[place]hone: [phone]: [phone] [address]hone: [phone]: [phone] Follow Up:  Patient agrees to Care Plan and Follow-up.  Plan: Telephone follow up appointment with care management team member scheduled for:  3 months and The care management team will reach out to the patient again over the next 45 days.  TaCherre RobinsPharmD Clinical Pharmacist [facility]

## 2021-02-23 NOTE — Telephone Encounter (Signed)
Patient is requesting refill for alprazolam 0.25mg  - up to tid prn. Last filled #60 09/07/2020.  Requesting Rx be sent to [facility] and to be delivered to her home.

## 2021-02-23 NOTE — Telephone Encounter (Signed)
Requesting: alprazolam 0.25mg  Contract: 08/23/2016 UDS: 09/11/2016 Last Visit: 12/29/2020 Next Visit: None Last Refill: 05/17/2020 #60 and 0RF  Please Advise

## 2021-02-26 NOTE — Patient Instructions (Signed)
[name],  It was a pleasure speaking with you today. Please feel free to contact me if you have any questions or concerns. Below is information regarding you health goals. I have also included an application for Jardiance patient assistance. Please complete that patient portions and return to our office. We will complete the provider portion and fax to the patient assistance program.    [name], PharmD Clinical Pharmacist [facility] [phone]   Visit Information  PATIENT GOALS: Goals Addressed            This Visit's Progress   . Chronic Care Management Pharmacy Care Plan   On track    CARE PLAN ENTRY (see longitudinal plan of care for additional care plan information)  Current Barriers:  . Chronic Disease Management support, education, and care coordination needs related to Hypertension, Hyperlipidemia/Hx of CAD, Diabetes, Tobacco Use Disorder, Depression/Anxiety, Osteoporosis, Neuropathy   Hypertension BP Readings from Last 3 Encounters:  01/17/21 122/82  12/29/20 118/60  12/06/20 120/60   . Pharmacist Clinical Goal(s): o Over the next 90 days, patient will work with PharmD and providers to maintain BP goal <130/80 . Current regimen:  . Amlodipine 10mg  daily . Lisinopril 40mg  daily . Metoprolol Succinate 50mg  daily . Interventions: o Requested patient to check her blood pressure 2 times per week and record . Patient self care activities - Over the next 90 days, patient will: o Check blood pressure 2 times per week, document, and provide at future appointments o Ensure daily salt intake < 2300 mg/day o Continue current regimen for blood pressure  Hyperlipidemia / Heart Disease Lab Results  Component Value Date/Time   LDLCALC 57 12/06/2020 04:03 PM   LDLCALC 61 06/15/2020 08:19 AM   LDLDIRECT 100.0 03/10/2020 02:10 PM   . Pharmacist Clinical Goal(s): o Over the next 90 days, patient will work with PharmD and  providers to maintain LDL goal < 70 . Current regimen:  . Aspirin 81mg  daily . Fenofibrate 160mg  daily . Omega 3 fish Oil 1030mg  twice daily . Rosuvastatin 20mg  daily . Interventions: o Reviewed last lipid panel with patient. Has maintained LDL at goal and Tg improved form 344 in 2021 to 145 on 12/06/2020 o Discussed that patient should be taking rosuvastatin vs atorvastatin (This was changed June 2021) . Patient self care activities - Over the next 90 days, patient will: o Continue current regimen for heart health and cholesterol o Continue to follow low fat diet  Diabetes Lab Results  Component Value Date/Time   HGBA1C 8.4 (H) 02/16/2021 09:37 AM   HGBA1C 9.4 (H) 12/06/2020 04:03 PM   . Pharmacist Clinical Goal(s): o Over the next 180 days, patient will work with PharmD and providers to achieve A1c goal <7% . Current regimen:  . Humulin 70/30 - 45 units with breakfast and 40 units with evening meal . Jardiance 25mg  - take 1 tablet each morning Interventions: o Discussed DM goals - Fasting (before meal blood sugar): 80-130 - Post-Prandial (2 hrs after eating blood sugar): less than 180 - A1c (3 month blood sugar average): less than 7% o Requested patient to check blood glucose twice daily o Mailed patient assistance application for Jardiance since patient reports she is in Medicare coverage gap. Will coordinate with patient and Dr to complete and submit PAP application o Collaborate with [facility] to find solution to having Jardiance included in medication packaging again to improved adherence.  . Patient self care activities - Over  the next 90 days, patient will: o Check blood sugar twice daily, document, and provide at future appointments o Contact provider with any episodes of hypoglycemia o Complete application for Jardiance patient assistance and return to [name], clinical pharmacist with at Dr Ernst Spell office  Depression / anxiety . Pharmacist  Clinical Goal(s) o Over the next 90 days, patient will work with PharmD and providers to reduce symptoms associated with depression . Current regimen:  . Alprazolam 0.25mg  three times daily as needed  . Fluoxetine 40mg  daily . Interventions: o Collaboration with provider regarding medication management (refills needed for alprazolam) - message sent to request refill. . Patient self care activities - Over the next 90 days, patient will: o Maintain current regimen for depression and anxiety  Osteoporosis . Pharmacist Clinical Goal(s) o Over the next 180 days, patient will work with PharmD and providers to reduce risk of fracture due to osteoporosis  . Current regimen:  o Alendronate 70mg  weekly . Interventions: o Consider repeat DEXA Scan . Patient self care activities - Over the next 180 days, patient will: o Consider repeat DEXA Scan  Shoulder Pain:  . Pharmacist Clinical Goal(s) o Over the next 180 days, patient will work with PharmD and providers to improve pain and improve BG control to A1c <8.0 so patient can be scheduled for needed shoulder surgery . Current regimen:  o Tramadol 50mg  up to 3 times a day if needed for pain . Interventions: o Discuss importance of taking diabetes meds daily and getting A1c <8.0 so surgery can be scheduled.  o Updated medication list to include tramadol and also removed methocarbamol which patient is not longer taking . Patient self care activities - Over the next 180 days, patient will: o Continue to follow up with orthopedic office for pain management   Medication management . Pharmacist Clinical Goal(s): o Over the next 90 days, patient will work with PharmD and providers to maintain optimal medication adherence . Current pharmacy: [facility] switched to [facility] . Interventions o Comprehensive medication review performed. o Utilize [facility] for medication synchronization, packaging and delivery . Patient self care activities - Over  the next 90 days, patient will: o Focus on medication adherence by filling and taking medications appropriately  o Take medications as prescribed o Report any questions or concerns to PharmD and/or provider(s)  Please see past updates related to this goal by clicking on the "Past Updates" button in the selected goal      . Increase physical activity   Not on track    Start Silver Sneakers.    . Quit smoking / using tobacco   On track      The patient verbalized understanding of instructions, educational materials, and care plan provided today and agreed to receive a mailed copy of patient instructions, educational materials, and care plan.   Telephone follow up appointment with clinical pharmacist scheduled for: 3 months The care management team will reach out to the patient again over the next 45 days.

## 2021-02-27 ENCOUNTER — Other Ambulatory Visit: Payer: Self-pay | Admitting: Physician Assistant

## 2021-02-28 ENCOUNTER — Other Ambulatory Visit: Payer: Self-pay | Admitting: Family Medicine

## 2021-02-28 DIAGNOSIS — M25561 Pain in right knee: Secondary | ICD-10-CM

## 2021-02-28 DIAGNOSIS — F419 Anxiety disorder, unspecified: Secondary | ICD-10-CM

## 2021-02-28 MED ORDER — ALPRAZOLAM 0.25 MG PO TABS
ORAL_TABLET | ORAL | 0 refills | Status: DC
Start: 2021-02-28 — End: 2021-02-28

## 2021-02-28 MED ORDER — ALPRAZOLAM 0.25 MG PO TABS
ORAL_TABLET | ORAL | 0 refills | Status: DC
Start: 2021-02-28 — End: 2021-06-20

## 2021-02-28 MED ORDER — HYDROCODONE-ACETAMINOPHEN 5-325 MG PO TABS: 1.0000 | ORAL_TABLET | Freq: Four times a day (QID) | ORAL | 0 refills | Status: DC | PRN

## 2021-02-28 NOTE — Telephone Encounter (Signed)
They did not receive. The Rx was set to print. Please sign off again.

## 2021-02-28 NOTE — Telephone Encounter (Signed)
Requesting: Alprazolam  Contract: 09/10/16 UDS: 09/10/16  Last Visit: 12/29/2020 Next Visit: none pending Last Refill: 02/23/21 ([facility])

## 2021-02-28 NOTE — Telephone Encounter (Signed)
Medication: ALPRAZolam (XANAX) 0.25 MG tablet [[id number]]      Has the patient contacted their pharmacy? no (If no, request that the patient contact the pharmacy for the refill.) (If yes, when and what did the pharmacy advise?)    Preferred Pharmacy (with phone number or street name):[facility] - [place], [place] - [place] [id number]  [address], [place] [address]  Phone:  [phone] Fax:  [phone]     Agent: Please be advised that RX refills may take up to 3 business days. We ask that you follow-up with your pharmacy.

## 2021-02-28 NOTE — Telephone Encounter (Signed)
Did [facility] receive it ?  We were having trouble sending to them

## 2021-03-25 ENCOUNTER — Other Ambulatory Visit: Payer: Self-pay | Admitting: Family Medicine

## 2021-03-25 DIAGNOSIS — E1165 Type 2 diabetes mellitus with hyperglycemia: Secondary | ICD-10-CM

## 2021-03-25 DIAGNOSIS — I10 Essential (primary) hypertension: Secondary | ICD-10-CM

## 2021-03-29 ENCOUNTER — Encounter: Payer: Self-pay | Admitting: Orthopaedic Surgery

## 2021-03-29 ENCOUNTER — Ambulatory Visit (INDEPENDENT_AMBULATORY_CARE_PROVIDER_SITE_OTHER): Payer: Worker's Compensation | Admitting: Orthopaedic Surgery

## 2021-03-29 ENCOUNTER — Other Ambulatory Visit: Payer: Self-pay

## 2021-03-29 DIAGNOSIS — M5442 Lumbago with sciatica, left side: Secondary | ICD-10-CM

## 2021-03-29 DIAGNOSIS — G8929 Other chronic pain: Secondary | ICD-10-CM

## 2021-03-29 DIAGNOSIS — M5441 Lumbago with sciatica, right side: Secondary | ICD-10-CM

## 2021-03-29 DIAGNOSIS — M25511 Pain in right shoulder: Secondary | ICD-10-CM

## 2021-03-29 NOTE — Progress Notes (Signed)
Office Visit Note   Patient: [name]           Date of Birth: [birth year]           MRN: [id number] Visit Date: 03/29/2021              Requested by: [address] [place], [place] [address] [place],  [place] [address] PCP: [name], [name], DO   Assessment & Plan: Visit Diagnoses:  1. Chronic right shoulder pain   2. Chronic bilateral low back pain with bilateral sciatica     Plan: For the shoulder we will continue to wait until the A1c is less than 8.0.  She had one done last month which was 8.4.  For the low back she has had partial relief from physical therapy but I would like to get an MRI at this point because I am concerned about spinal stenosis.  We will have her come back after the MRI.  Work note provided today for continued light duty.  Case manager was present during the entire encounter.  Follow-Up Instructions: No follow-ups on file.   Orders:  No orders of the defined types were placed in this encounter.  No orders of the defined types were placed in this encounter.     Procedures: No procedures performed   Clinical Data: No additional findings.   Subjective: Chief Complaint  Patient presents with   Right Shoulder - Pain   Lower Back - Pain    [name] comes in today for follow-up of her chronic right shoulder pain and low back pain.  Shoulder is no different and she is waiting for the A1c to come down for surgery.  In terms of her back she is doing physical therapy twice a week and the back pain has significantly improved but she mainly has severe pain in both of her legs.  She has a left calf pain as well.  Denies any bowel or bladder dysfunction.   Review of Systems  Constitutional: Negative.   HENT: Negative.    Eyes: Negative.   Respiratory: Negative.    Cardiovascular: Negative.   Endocrine: Negative.   Musculoskeletal: Negative.   Neurological: Negative.   Hematological: Negative.   Psychiatric/Behavioral:  Negative.    All other systems reviewed and are negative.   Objective: Vital Signs: There were no vitals taken for this visit.  Physical Exam Vitals and nursing note reviewed.  Constitutional:      Appearance: She is well-developed.  Pulmonary:     Effort: Pulmonary effort is normal.  Skin:    General: Skin is warm.     Capillary Refill: Capillary refill takes less than 2 seconds.  Neurological:     Mental Status: She is alert and oriented to person, place, and time.  Psychiatric:        Behavior: Behavior normal.        Thought Content: Thought content normal.        Judgment: Judgment normal.    Ortho Exam Right shoulder exam is unchanged. Low back exam is unchanged. Specialty Comments:  No specialty comments available.  Imaging: No results found.   PMFS History: Patient Active Problem List   Diagnosis Date Noted   Diabetes mellitus (HCC) 01/17/2021   Type 2 diabetes mellitus with hyperglycemia, without long-term current use of insulin (HCC) 01/17/2021   Acute pain of right shoulder 08/18/2020   Primary osteoarthritis of right knee 08/18/2020   Impacted cerumen of  right ear 05/24/2020   Bilateral impacted cerumen 05/17/2020   Hyperlipidemia associated with type 2 diabetes mellitus (HCC) 03/10/2020   Cough 03/10/2020   Seasonal allergies 03/10/2020   Elevated rheumatoid factor 07/21/2018   Gastroenteritis 07/21/2018   Trigger middle finger of left hand 12/19/2017   Ganglion cyst of dorsum of right wrist 12/19/2017   Morton's neuroma of right foot 12/19/2017   Preventative health care 12/13/2017   Pain in both hands 12/13/2017   Hyperlipidemia LDL goal <100 12/13/2017   Abscess of upper gum 12/13/2017   Spinal stenosis of cervical region 09/25/2016   Cervical radiculopathy 09/06/2016   Dyspnea 02/18/2016   Chest pain 02/18/2016   HLD (hyperlipidemia) 02/18/2016   CAD in native artery 02/18/2016   RLS (restless legs syndrome) 02/18/2016   Pancreatitis  02/18/2016   SOB (shortness of breath) 02/18/2016   Uncontrolled type 2 diabetes mellitus with complication (HCC)    Tobacco abuse    Osteoarthritis of right knee 07/05/2015   Smoking 07/02/2013   Acute upper respiratory infections of unspecified site 07/02/2013   Flu vaccine need 07/02/2013   Multinodular goiter 04/16/2013   Bunion 07/07/2012   BACK PAIN, THORACIC REGION, RIGHT 05/24/2009   IRON DEFICIENCY 03/25/2009   GERD 03/25/2009   WEIGHT LOSS 03/25/2009   SINUSITIS- ACUTE-NOS 11/25/2008   Hyperlipidemia LDL goal <70 10/07/2008   ACUTE PANCREATITIS 09/07/2008   OVARIAN CYST 09/07/2008   KNEE PAIN, RIGHT 05/24/2008   CARPAL TUNNEL SYNDROME, RIGHT 05/12/2008   Acute pain of both shoulders 04/02/2008   ROTATOR CUFF REPAIR, RIGHT, HX OF 03/29/2008   GOITER, MULTINODULAR 12/26/2007   Depression 07/07/2007   HIP PAIN, RIGHT 03/11/2007   RESTLESS LEG SYNDROME, HX OF 03/11/2007   Diabetes mellitus type II, uncontrolled (HCC) 03/07/2007   Essential hypertension 03/07/2007   Past Medical History:  Diagnosis Date   Blockage of coronary artery of heart (HCC)    Depression    Diabetes mellitus    Hyperlipidemia    Hypertension    Pancreatitis    Restless leg syndrome     Family History  Problem Relation Age of Onset   Heart attack Father    Heart attack Brother    Diabetes Sister    Cirrhosis Sister    Hypertension Sister    Cancer Mother    Hypertension Mother    Vision loss Mother    Cancer Sister    Sleep apnea Sister    Heart Problems Sister    Diabetes Sister    Gout Sister    Diabetes Son    Heart attack Son    Diabetes Daughter    Goiter Daughter    Diabetes Daughter     Past Surgical History:  Procedure Laterality Date   CATARACT EXTRACTION, BILATERAL     CORONARY ANGIOPLASTY WITH STENT PLACEMENT     FOOT SURGERY Right    cyst removal   ROTATOR CUFF REPAIR     right   ROTATOR CUFF REPAIR Left    TUBAL LIGATION     Social History   Occupational  History   Not on file  Tobacco Use   Smoking status: Former    Packs/day: 0.20    Pack years: 0.00    Types: Cigarettes    Quit date: 05/05/2020    Years since quitting: 0.8   Smokeless tobacco: Never   Tobacco comments:    quit in 2021  Vaping Use   Vaping Use: Never used  Substance and Sexual Activity  Alcohol use: No   Drug use: No   Sexual activity: Not on file

## 2021-04-11 ENCOUNTER — Other Ambulatory Visit: Payer: Self-pay

## 2021-04-11 ENCOUNTER — Ambulatory Visit: Payer: Medicare HMO | Admitting: Internal Medicine

## 2021-04-11 MED ORDER — LEVOCETIRIZINE DIHYDROCHLORIDE 5 MG PO TABS: ORAL_TABLET | ORAL | 1 refills | Status: DC

## 2021-04-11 NOTE — Progress Notes (Deleted)
Name: [name]  Age/ Sex: 75 y.o., female   MRN/ DOB: [id number], 05/11/1946     PCP: Ann Held, DO   Reason for Endocrinology Evaluation: Type 2 Diabetes Mellitus  Initial Endocrine Consultative Visit: 01/17/2021    PATIENT IDENTIFIER: [name] is a 75 y.o. female with a past medical history of HTN, T2Dm, CAD , Osteoporosis and Hx of pancreatitis. The patient has followed with Endocrinology clinic since 4 f/09/2021 or consultative assistance with management of her diabetes.  DIABETIC HISTORY:  [name] was diagnosed with DM in 1998, has intolerance to Metformin due to Gi side effects. Her hemoglobin A1c has ranged from 9.3% in 2015, peaking at 10.1% in 2020.  She has history of Pancreatitis that she believes is caused Metformin   On her initial visit to our clinic she had an A1c of 9.4%  , she was on Jardiance,Glimepiride , Januvia and insulin mix . We stopped Glimepiride as she was not taking it, we stopped Januvia due to hx of pancreatitis. We continued jardiance and adjusted insulin mix    SUBJECTIVE:   During the last visit (01/17/2021): A1c 9.4% We adjusted insulin mix, conitnued Jardiance and stopped Januvia and Glimepiride   Today (04/11/2021): [name] is here for a follow up on diabetes management. She checks her blood sugars *** times daily, preprandial to breakfast and ***. The patient has *** had hypoglycemic episodes since the last clinic visit, which typically occur *** x / - most often occuring ***. The patient is *** symptomatic with these episodes, with symptoms of {symptoms; hypoglycemia:[id number]}.   HOME DIABETES REGIMEN:  Jardiance 25 mg, 1 tablet in the morning Novolin Mix to 45 units before Breakfast and 40 units Before Supper     Statin: yes ACE-I/ARB: yes  METER DOWNLOAD SUMMARY: Date range evaluated: *** Fingerstick Blood Glucose Tests = *** Average Number Tests/Day = *** Overall Mean FS Glucose = *** Standard  Deviation = ***  BG Ranges: Low = *** High = ***   Hypoglycemic Events/30 Days: BG < 50 = *** Episodes of symptomatic severe hypoglycemia = ***    DIABETIC COMPLICATIONS: Microvascular complications:   Denies: CKD, retinopathy, neuropathy Last Eye Exam: Completed 12/2020  Macrovascular complications:  CAD ( S/P stent placement)  Denies: CVA, PVD   HISTORY:  Past Medical History:  Past Medical History:  Diagnosis Date   Blockage of coronary artery of heart (HCC)    Depression    Diabetes mellitus    Hyperlipidemia    Hypertension    Pancreatitis    Restless leg syndrome    Past Surgical History:  Past Surgical History:  Procedure Laterality Date   CATARACT EXTRACTION, BILATERAL     CORONARY ANGIOPLASTY WITH STENT PLACEMENT     FOOT SURGERY Right    cyst removal   ROTATOR CUFF REPAIR     right   ROTATOR CUFF REPAIR Left    TUBAL LIGATION     Social History:  reports that she quit smoking about 11 months ago. Her smoking use included cigarettes. She smoked an average of 0.20 packs per day. She has never used smokeless tobacco. She reports that she does not drink alcohol and does not use drugs. Family History:  Family History  Problem Relation Age of Onset   Heart attack Father    Heart attack Brother    Diabetes Sister    Cirrhosis Sister    Hypertension Sister    Cancer Mother  Hypertension Mother    Vision loss Mother    Cancer Sister    Sleep apnea Sister    Heart Problems Sister    Diabetes Sister    Gout Sister    Diabetes Son    Heart attack Son    Diabetes Daughter    Goiter Daughter    Diabetes Daughter      HOME MEDICATIONS: Allergies as of 04/11/2021       Reactions   Metformin And Related Other (See Comments)   Sick on stomach, sweaty, throw up, pain   Levemir [insulin Detemir] Hives, Swelling   Clindamycin Nausea And Vomiting        Medication List        Accurate as of April 11, 2021  7:22 AM. If you have any questions,  ask your nurse or doctor.          alendronate 70 MG tablet Commonly known as: FOSAMAX Take with a full glass of water on an empty stomach.   ALPRAZolam 0.25 MG tablet Commonly known as: XANAX Take 1 tablet by mouth three times a day if needed   amLODipine 10 MG tablet Commonly known as: NORVASC TAKE ONE TABLET BY MOUTH ONCE DAILY   aspirin EC 81 MG tablet Take 81 mg by mouth daily.   B-D SINGLE USE SWABS REGULAR Pads USE AS DIRECTED   empagliflozin 25 MG Tabs tablet Commonly known as: Jardiance Take 1 tablet (25 mg total) by mouth daily before breakfast.   fenofibrate 160 MG tablet Take 1 tablet (160 mg total) by mouth daily.   ferrous sulfate 325 (65 FE) MG tablet Take 325 mg by mouth daily with breakfast.   Fish Oil 1000 MG Caps Take by mouth.   FLAXSEED OIL PO Take 1 capsule by mouth 2 (two) times daily.   FLUoxetine 40 MG capsule Commonly known as: PROZAC TAKE ONE CAPSULE BY MOUTH ONCE DAILY   fluticasone 50 MCG/ACT nasal spray Commonly known as: FLONASE SHAKE LIQUID AND USE 2 SPRAYS IN EACH NOSTRIL DAILY   HYDROcodone-acetaminophen 5-325 MG tablet Commonly known as: Norco Take 1-2 tablets by mouth daily as needed.   HYDROcodone-acetaminophen 5-325 MG tablet Commonly known as: Norco Take 1 tablet by mouth every 6 (six) hours as needed for moderate pain.   Insulin Pen Needle 32G X 4 MM Misc 1 Device by Does not apply route 2 (two) times daily.   Lancing Device Misc   levocetirizine 5 MG tablet Commonly known as: XYZAL TAKE 1 TABLET(5 MG) BY MOUTH EVERY EVENING   lisinopril 40 MG tablet Commonly known as: ZESTRIL Take 1 tablet (40 mg total) by mouth daily.   meloxicam 7.5 MG tablet Commonly known as: MOBIC Take 1 tablet (7.5 mg total) by mouth daily as needed for pain.   methocarbamol 500 MG tablet Commonly known as: Robaxin Take 1 tablet (500 mg total) by mouth 2 (two) times daily as needed.   metoprolol succinate 50 MG 24 hr  tablet Commonly known as: TOPROL-XL TAKE ONE TABLET BY MOUTH ONCE DAILY WITH FOOD   multivitamin with minerals Tabs tablet Take 1 tablet by mouth daily.   NovoLIN 70/30 FlexPen (70-30) 100 UNIT/ML KwikPen Generic drug: insulin isophane & regular human Inject 45 Units into the skin daily before breakfast AND 40 Units daily before supper.   rosuvastatin 20 MG tablet Commonly known as: CRESTOR TAKE ONE TABLET BY MOUTH EVERY EVENING   Tai Doc Control Normal Soln   THERATEARS OP Place 1  drop into both eyes daily as needed (dry eyes).   traMADol 50 MG tablet Commonly known as: ULTRAM Take 1 tablet (50 mg total) by mouth 3 (three) times daily as needed.   True Metrix Air Glucose Meter w/Device Kit 1 each by Does not apply route 4 (four) times daily. DX CODE E11.8   True Metrix Blood Glucose Test test strip Generic drug: glucose blood USE AS DIRECTED 4 TIMES A DAY   TRUEplus Lancets 33G Misc USE AS DIRECTED 4 TIMES A DAY   vitamin E 180 MG (400 UNITS) capsule Take 400 Units by mouth daily.         OBJECTIVE:   Vital Signs: There were no vitals taken for this visit.  Wt Readings from Last 3 Encounters:  01/27/21 177 lb (80.3 kg)  01/17/21 177 lb 4 oz (80.4 kg)  12/29/20 177 lb 9.6 oz (80.6 kg)     Exam: General: Pt appears well and is in NAD  Lungs: Clear with good BS bilat with no rales, rhonchi, or wheezes  Heart: RRR with normal S1 and S2 and no gallops; no murmurs; no rub  Abdomen: Normoactive bowel sounds, soft, nontender, without masses or organomegaly palpable  Extremities: No pretibial edema.   Neuro: MS is good with appropriate affect, pt is alert and Ox3    DM foot exam: 01/17/2021   The skin of the feet is intact without sores or ulcerations. The pedal pulses are 2+ on right and 2+ on left. The sensation is intact to a screening 5.07, 10 gram monofilament bilaterally       DATA REVIEWED:  Lab Results  Component Value Date   HGBA1C 8.4 (H)  02/16/2021   HGBA1C 9.4 (H) 12/06/2020   HGBA1C 10.0 (H) 06/15/2020   Lab Results  Component Value Date   MICROALBUR 12.2 (H) 12/06/2020   LDLCALC 57 12/06/2020   CREATININE 0.96 12/06/2020   Lab Results  Component Value Date   MICRALBCREAT 13.3 12/06/2020     Lab Results  Component Value Date   CHOL 118 12/06/2020   HDL 32.40 (L) 12/06/2020   LDLCALC 57 12/06/2020   LDLDIRECT 100.0 03/10/2020   TRIG 145.0 12/06/2020   CHOLHDL 4 12/06/2020         ASSESSMENT / PLAN / RECOMMENDATIONS:   1) Type 2 Diabetes Mellitus, ***controlled, With *** complications - Most recent A1c of 8.4 %. Goal A1c < 7.0 %.    Plan: MEDICATIONS: ***  EDUCATION / INSTRUCTIONS: BG monitoring instructions: Patient is instructed to check her blood sugars *** times a day, ***. Call [facility] if: BG persistently < 70 or > 300. I reviewed the Rule of 15 for the treatment of hypoglycemia in detail with the patient. Literature supplied.   2) Diabetic complications:  Eye: Does not have known diabetic retinopathy.  Neuro/ Feet: Does not have known diabetic peripheral neuropathy .  Renal: Patient does not have known baseline CKD. She   is *** on an ACEI/ARB at present.      F/U in ***    Signed electronically by: Mack Guise, MD  [facility]  [facility] [address] [place], [place] [address] Phone: [phone] FAX: [phone]   CC: Claudette Laws [address] [place] [address] Phone: [phone]  Fax: [phone]  Return to Endocrinology clinic as below: Future Appointments  Date Time Provider [place]  04/11/2021  7:30 AM Aymar Whitfill, Mammie Lorenzo  Amedeo Kinsman, MD [facility]  04/24/2021  3:20 PM Etter Sjogren Koren Shiver, DO [facility]  05/10/2021  8:45 AM Leandrew Koyanagi, MD [facility] None  06/06/2021  1:00 PM [facility] PHARMACIST [facility]

## 2021-04-12 ENCOUNTER — Telehealth: Payer: Self-pay | Admitting: Pharmacist

## 2021-04-12 NOTE — Chronic Care Management (AMB) (Signed)
Chronic Care Management Pharmacy Assistant   Name: MYRLA MALANOWSKI  MRN: [id number] DOB: 11/07/1945    Reason for Encounter: Disease State - Diabetes    Recent office visits:  None noted.  Recent consult visits:  03/29/21 Frankey Shown, MD (Orthopedics) Chronic right shoulder pain -  Hospital visits:  None in previous 6 months  Medications: Outpatient Encounter Medications as of 04/12/2021  Medication Sig   Alcohol Swabs (B-D SINGLE USE SWABS REGULAR) PADS USE AS DIRECTED   alendronate (FOSAMAX) 70 MG tablet Take with a full glass of water on an empty stomach.   ALPRAZolam (XANAX) 0.25 MG tablet Take 1 tablet by mouth three times a day if needed   amLODipine (NORVASC) 10 MG tablet TAKE ONE TABLET BY MOUTH ONCE DAILY   aspirin EC 81 MG tablet Take 81 mg by mouth daily.   Blood Glucose Calibration (TAI DOC CONTROL) NORMAL SOLN    Blood Glucose Monitoring Suppl (TRUE METRIX AIR GLUCOSE METER) w/Device KIT 1 each by Does not apply route 4 (four) times daily. 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   empagliflozin (JARDIANCE) 25 MG TABS tablet Take 1 tablet (25 mg total) by mouth daily before breakfast.   fenofibrate 160 MG tablet Take 1 tablet (160 mg total) by mouth daily.   ferrous sulfate 325 (65 FE) MG tablet Take 325 mg by mouth daily with breakfast.    Flaxseed, Linseed, (FLAXSEED OIL PO) Take 1 capsule by mouth 2 (two) times daily.    FLUoxetine (PROZAC) 40 MG capsule TAKE ONE CAPSULE BY MOUTH ONCE DAILY   fluticasone (FLONASE) 50 MCG/ACT nasal spray SHAKE LIQUID AND USE 2 SPRAYS IN EACH NOSTRIL DAILY   glucose blood (TRUE METRIX BLOOD GLUCOSE TEST) test strip USE AS DIRECTED 4 TIMES A DAY   HYDROcodone-acetaminophen (NORCO) 5-325 MG tablet Take 1-2 tablets by mouth daily as needed. (Patient not taking: Reported on 02/23/2021)   HYDROcodone-acetaminophen (NORCO) 5-325 MG tablet Take 1 tablet by mouth  every 6 (six) hours as needed for moderate pain.   Insulin Pen Needle 32G X 4 MM MISC 1 Device by Does not apply route 2 (two) times daily.   Lancet Devices (LANCING DEVICE) MISC    levocetirizine (XYZAL) 5 MG tablet TAKE 1 TABLET(5 MG) BY MOUTH EVERY EVENING   lisinopril (ZESTRIL) 40 MG tablet Take 1 tablet (40 mg total) by mouth daily.   meloxicam (MOBIC) 7.5 MG tablet Take 1 tablet (7.5 mg total) by mouth daily as needed for pain. (Patient not taking: Reported on 02/23/2021)   methocarbamol (ROBAXIN) 500 MG tablet Take 1 tablet (500 mg total) by mouth 2 (two) times daily as needed. (Patient not taking: Reported on 02/23/2021)   metoprolol succinate (TOPROL-XL) 50 MG 24 hr tablet TAKE ONE TABLET BY MOUTH ONCE DAILY WITH FOOD   Multiple Vitamin (MULTIVITAMIN WITH MINERALS) TABS Take 1 tablet by mouth daily.   NOVOLIN 70/30 FLEXPEN (70-30) 100 UNIT/ML KwikPen Inject 45 Units into the skin daily before breakfast AND 40 Units daily before supper.   Omega-3 Fatty Acids (FISH OIL) 1000 MG CAPS Take by mouth.   rosuvastatin (CRESTOR) 20 MG tablet TAKE ONE TABLET BY MOUTH EVERY EVENING   traMADol (ULTRAM) 50 MG tablet Take 1 tablet (50 mg total) by mouth 3 (three) times daily as needed.   TRUEplus Lancets 33G MISC USE AS DIRECTED 4 TIMES A DAY   vitamin E  400 UNIT capsule Take 400 Units by mouth daily.   No facility-administered encounter medications on file as of 04/12/2021.   Recent Relevant Labs: Lab Results  Component Value Date/Time   HGBA1C 8.4 (H) 02/16/2021 09:37 AM   HGBA1C 9.4 (H) 12/06/2020 04:03 PM   MICROALBUR 12.2 (H) 12/06/2020 04:03 PM   MICROALBUR 27.3 (H) 07/28/2019 02:50 PM    Kidney Function Lab Results  Component Value Date/Time   CREATININE 0.96 12/06/2020 04:03 PM   CREATININE 0.82 09/23/2020 02:38 PM   CREATININE 1.04 (H) 06/15/2020 08:19 AM   GFR 58.12 (L) 12/06/2020 04:03 PM   GFRNONAA >60 09/23/2020 02:38 PM   GFRAA >60 02/18/2016 10:01 AM    Current  antihyperglycemic regimen:  Humulin 70/30 - 45 units with breakfast and 40 units with evening meal Jardiance 2m - take 1 tablet each morning  What recent interventions/DTPs have been made to improve glycemic control:  None at this time.  Have there been any recent hospitalizations or ED visits since last visit with CPP? No  Patient denies hypoglycemic symptoms, including Pale, Sweaty, Shaky, Hungry, Nervous/irritable, and Vision changes  Patient denies hyperglycemic symptoms, including blurry vision, excessive thirst, fatigue, polyuria, and weakness  How often are you checking your blood sugar? twice daily  What are your blood sugars ranging?  Fasting: 90-160 Before meals:  After meals:  Bedtime:   During the week, how often does your blood glucose drop below 70?  Patient reports it has in the past but is rare.  Are you checking your feet daily/regularly?  Patient reports she is checking 2-3 times daily  Adherence Review: Is the patient currently on a STATIN medication? Yes Is the patient currently on ACE/ARB medication? Yes Does the patient have >5 day gap between last estimated fill dates? No   Star Rating Drugs:  Rosuvastatin 20 mg  - last filled 01/31/2021 - 90 days Empagliflozin 25 mg - last filled 01/31/2021 - 30 days ?? Lisinopril 40 mg  - last filled 01/31/21 - 90 days   JAmesPharmacist Assistant [phone]

## 2021-04-18 ENCOUNTER — Ambulatory Visit: Payer: Medicare HMO | Admitting: Family Medicine

## 2021-04-18 NOTE — Chronic Care Management (AMB) (Signed)
error 

## 2021-04-18 NOTE — Chronic Care Management (AMB) (Signed)
Patient states she has not completed patient assistance application. She states she will complete it today and mail it back to primary care office. Patient states she called [facility] today and they are delivering the jardiance today.  Lura Em Clinical Pharmacist Assistant [phone]

## 2021-04-18 NOTE — Telephone Encounter (Signed)
Looks like patient has not filled Jardiance since 02/28/2021 for #30 tablets per refill records in [place]. I mailed patient assistance application in May when she reported that she was in Medicare coverage gap. Will have CMA check in with patient to see if she has completed and bring to appt with Dr Laury Axon 04/24/21 or drop by office sooner.

## 2021-04-19 ENCOUNTER — Telehealth: Payer: Self-pay

## 2021-04-19 NOTE — Telephone Encounter (Signed)
Pt called in stated that she needed Dr.Lowne's NPI and license number

## 2021-04-20 ENCOUNTER — Ambulatory Visit (INDEPENDENT_AMBULATORY_CARE_PROVIDER_SITE_OTHER): Payer: Medicare HMO | Admitting: Pharmacist

## 2021-04-20 DIAGNOSIS — E1159 Type 2 diabetes mellitus with other circulatory complications: Secondary | ICD-10-CM

## 2021-04-20 DIAGNOSIS — E1169 Type 2 diabetes mellitus with other specified complication: Secondary | ICD-10-CM

## 2021-04-20 DIAGNOSIS — Z794 Long term (current) use of insulin: Secondary | ICD-10-CM

## 2021-04-20 DIAGNOSIS — E785 Hyperlipidemia, unspecified: Secondary | ICD-10-CM | POA: Diagnosis not present

## 2021-04-20 DIAGNOSIS — I10 Essential (primary) hypertension: Secondary | ICD-10-CM | POA: Diagnosis not present

## 2021-04-20 NOTE — Chronic Care Management (AMB) (Signed)
Chronic Care Management Pharmacy Note  04/20/2021 Name:  SANVIKA CUTTINO MRN:  [id number] DOB:  04/14/1946  Subjective: [name] is an 75 y.o. year old female who is a primary patient of Ann Held, DO.  The CCM team was consulted for assistance with disease management and care coordination needs.    Collaboration with patient, PCP and patient assistance program  for  submission of patient assistance application for Jardiance  in response to provider referral for pharmacy case management and/or care coordination services.   Consent to Services:  The patient was given information about Chronic Care Management services, agreed to services, and gave verbal consent prior to initiation of services.  Please see initial visit note for detailed documentation.   Patient Care Team: Carollee Herter, Alferd Apa, DO as PCP - General Buford Dresser, MD as PCP - Cardiology (Cardiology) Ashok Pall, MD as Consulting Physician (Neurosurgery) Monna Fam, MD as Consulting Physician (Ophthalmology) Cherre Robins, PharmD (Pharmacist) [facility], Melanie Crazier, MD as Consulting Physician (Endocrinology)  Recent office visits: 12/29/2020 - PCP (Dr Etter Sjogren) f/u DM; Not controlled. Referred to endo. Increased Jardiance to 21m daily.  Recent consult visits: 01/27/2021 - Ortho (Dr XErlinda Hong: acute low back pain. Stated prednisone 585mdose pack and methocarbamol 50011mid prn.  01/17/2021- Endo (Dr ShaKelton Pillarnitial endocrinology visit. H/o pancreatitis. Januvia stopped. Due to history of pancreatitis, GLP-1 RA and DPP-4 inhibitors are contra-indicated. Stopped glimepiride due to increased risk of hypoglycemia with insulin. Continue Jardiance 25 mg, 1 tablet in the morning. Change Novolin Mix to 45 units before Breakfast and 40 units Before Supper. Patient is instructed to check blood sugars 2 times a day, before breakfast and.   Hospital visits: ED 09/23/2020 for  CAP Objective:  Lab Results  Component Value Date   CREATININE 0.96 12/06/2020   CREATININE 0.82 09/23/2020   CREATININE 1.04 (H) 06/15/2020    Lab Results  Component Value Date   HGBA1C 8.4 (H) 02/16/2021   Last diabetic Eye exam:  Lab Results  Component Value Date/Time   HMDIABEYEEXA Retinopathy (A) 12/30/2020 12:00 AM    Last diabetic Foot exam: No results found for: HMDIABFOOTEX  - Done 12/29/2020 by Dr ShaKelton Pillar   Component Value Date/Time   CHOL 118 12/06/2020 1603   TRIG 145.0 12/06/2020 1603   HDL 32.40 (L) 12/06/2020 1603   CHOLHDL 4 12/06/2020 1603   VLDL 29.0 12/06/2020 1603   LDLCALC 57 12/06/2020 1603   LDLCALC 61 06/15/2020 0819   LDLDIRECT 100.0 03/10/2020 1410    Hepatic Function Latest Ref Rng & Units 12/06/2020 09/23/2020 06/15/2020  Total Protein 6.0 - 8.3 g/dL 7.2 7.5 6.9  Albumin 3.5 - 5.2 g/dL 4.4 4.5 -  AST 0 - 37 U/L '16 21 18  ' ALT 0 - 35 U/L '16 22 17  ' Alk Phosphatase 39 - 117 U/L 38(L) 43 -  Total Bilirubin 0.2 - 1.2 mg/dL 0.5 0.6 0.4  Bilirubin, Direct 0.0 - 0.3 mg/dL - - -    Lab Results  Component Value Date/Time   TSH 0.58 08/11/2018 02:34 PM   TSH 0.57 07/16/2018 09:50 AM   FREET4 0.74 08/11/2018 02:34 PM   FREET4 0.84 04/16/2013 11:19 AM    CBC Latest Ref Rng & Units 12/06/2020 09/23/2020 08/11/2018  WBC 4.0 - 10.5 K/uL 7.0 13.9(H) 11.2(H)  Hemoglobin 12.0 - 15.0 g/dL 12.2 13.5 12.3  Hematocrit 36.0 - 46.0 % 36.6 40.1 36.9  Platelets 150.0 - 400.0 K/uL 182.0 215  221.0    No results found for: VD25OH  Clinical ASCVD: Yes  The ASCVD Risk score Mikey Bussing DC Jr., et al., 2013) failed to calculate for the following reasons:   The valid total cholesterol range is 130 to 320 mg/dL    Other: DEXA 05/29/2017 Forearm Radius 33% T-score = -2.5.  DualFemur Neck Left  T-Score = -2.0; Which was a statistically significant increase in BMD of the left hip since prior exam dated 12/17/2013.  Social History   Tobacco Use  Smoking Status  Former   Packs/day: 0.20   Types: Cigarettes   Quit date: 05/05/2020   Years since quitting: 0.9  Smokeless Tobacco Never  Tobacco Comments   quit in 2021   BP Readings from Last 3 Encounters:  01/17/21 122/82  12/29/20 118/60  12/06/20 120/60   Pulse Readings from Last 3 Encounters:  12/29/20 68  12/06/20 (!) 55  09/23/20 75   Wt Readings from Last 3 Encounters:  01/27/21 177 lb (80.3 kg)  01/17/21 177 lb 4 oz (80.4 kg)  12/29/20 177 lb 9.6 oz (80.6 kg)    Assessment: Review of patient past medical history, allergies, medications, health status, including review of consultants reports, laboratory and other test data, was performed as part of comprehensive evaluation and provision of chronic care management services.   SDOH:  (Social Determinants of Health) assessments and interventions performed:     CCM Care Plan  Allergies  Allergen Reactions   Metformin And Related Other (See Comments)    Sick on stomach, sweaty, throw up, pain   Levemir [Insulin Detemir] Hives and Swelling   Clindamycin Nausea And Vomiting    Medications Reviewed Today     Reviewed by Cherre Robins, PharmD (Pharmacist) on 04/20/21 at [facility] List Status: <None>   Medication Order Taking? Sig Documenting Provider Last Dose Status Informant  Alcohol Swabs (B-D SINGLE USE SWABS REGULAR) PADS [id number] No USE AS DIRECTED Carollee Herter, Alferd Apa, DO Taking Active   alendronate (FOSAMAX) 70 MG tablet [id number] No Take with a full glass of water on an empty stomach. Carollee Herter, Kendrick Fries R, DO Taking Active   ALPRAZolam Duanne Moron) 0.25 MG tablet [id number]  Take 1 tablet by mouth three times a day if needed Ann Held, DO  Active   amLODipine (NORVASC) 10 MG tablet [id number]  TAKE ONE TABLET BY MOUTH ONCE DAILY Ann Held, DO  Active   aspirin EC 81 MG tablet [id number] No Take 81 mg by mouth daily. [provider] Taking Active Self  Blood Glucose Calibration (TAI DOC CONTROL)  NORMAL SOLN [id number] No  [provider] Taking Active Self  Blood Glucose Monitoring Suppl (TRUE METRIX AIR GLUCOSE METER) w/Device KIT [id number] No 1 each by Does not apply route 4 (four) times daily. DX CODE E11.8 Ann Held, DO Taking Active   Carboxymethylcellulose Sodium (THERATEARS OP) [id number] No Place 1 drop into both eyes daily as needed (dry eyes). [provider] Taking Active Self  empagliflozin (JARDIANCE) 25 MG TABS tablet [id number] No Take 1 tablet (25 mg total) by mouth daily before breakfast. Shamleffer, Melanie Crazier, MD Taking Active   fenofibrate 160 MG tablet [id number] No Take 1 tablet (160 mg total) by mouth daily. Ann Held, DO Taking Active   ferrous sulfate 325 (65 FE) MG tablet [id number] No Take 325 mg by mouth daily with breakfast.  [provider] Taking Active Self  Flaxseed, Linseed, (FLAXSEED OIL PO) [id number]  No Take 1 capsule by mouth 2 (two) times daily.  [provider] Taking Active Self  FLUoxetine (PROZAC) 40 MG capsule [id number]  TAKE ONE CAPSULE BY MOUTH ONCE DAILY Carollee Herter, Alferd Apa, DO  Active   fluticasone [facility]) 50 MCG/ACT nasal spray [id number] No SHAKE LIQUID AND USE 2 SPRAYS IN [facility] NOSTRIL DAILY Roma Schanz R, DO Taking Active   glucose blood (TRUE METRIX BLOOD GLUCOSE TEST) test strip [id number] No USE AS DIRECTED 4 TIMES A DAY Lowne Chase, Yvonne R, DO Taking Active   HYDROcodone-acetaminophen (NORCO) 5-325 MG tablet [id number] No Take 1-2 tablets by mouth daily as needed.  Patient not taking: Reported on 02/23/2021   Aundra Dubin, PA-C Not Taking Active   HYDROcodone-acetaminophen [facility]) 5-325 MG tablet [id number]  Take 1 tablet by mouth every 6 (six) hours as needed for moderate pain. Roma Schanz R, DO  Active   Insulin Pen Needle 32G X 4 MM MISC [id number] No 1 Device by Does not apply route 2 (two) times daily. Shamleffer, Melanie Crazier, MD Taking Active   Lancet  Devices (LANCING DEVICE) [place] [id number] No  [provider] Taking Active Self  levocetirizine (XYZAL) 5 MG tablet [id number]  TAKE 1 TABLET(5 MG) BY MOUTH EVERY EVENING Lowne Chase, Yvonne R, DO  Active   lisinopril (ZESTRIL) 40 MG tablet [id number] No Take 1 tablet (40 mg total) by mouth daily. Buford Dresser, MD Taking Active   meloxicam [facility]) 7.5 MG tablet [id number] No Take 1 tablet (7.5 mg total) by mouth daily as needed for pain.  Patient not taking: Reported on 02/23/2021   Ann Held, DO Not Taking Active   methocarbamol (ROBAXIN) 500 MG tablet [id number] No Take 1 tablet (500 mg total) by mouth 2 (two) times daily as needed.  Patient not taking: Reported on 02/23/2021   Aundra Dubin, PA-C Not Taking Active   metoprolol succinate (TOPROL-XL) 50 MG 24 hr tablet [id number]  TAKE ONE TABLET BY MOUTH ONCE DAILY WITH FOOD Carollee Herter, Alferd Apa, DO  Active   Multiple Vitamin (MULTIVITAMIN WITH MINERALS) TABS [id number] No Take 1 tablet by mouth daily. [provider] Taking Active Self  NOVOLIN 70/30 FLEXPEN (70-30) 100 UNIT/ML KwikPen [id number] No Inject 45 Units into the skin daily before breakfast AND 40 Units daily before supper. Shamleffer, Melanie Crazier, MD Taking Active   Omega-3 Fatty Acids (FISH OIL) 1000 MG CAPS [id number] No Take by mouth. [provider] Taking Active            Med Note Elana Alm, [place] Rounds Oct 14, 2020  9:11 AM)    rosuvastatin (CRESTOR) 20 MG tablet [id number]  TAKE ONE TABLET BY MOUTH EVERY EVENING Carollee Herter, Alferd Apa, DO  Active   traMADol (ULTRAM) 50 MG tablet [id number] No Take 1 tablet (50 mg total) by mouth 3 (three) times daily as needed. Aundra Dubin, PA-C Taking Active   TRUEplus Lancets 33G MISC [id number] No USE AS DIRECTED 4 TIMES A DAY Ann Held, DO Taking Active   vitamin E 400 UNIT capsule [id number] No Take 400 Units by mouth daily. [provider] Taking Active Self             Patient Active Problem List   Diagnosis Date Noted   Diabetes mellitus ([place]) 01/17/2021   Type 2 diabetes mellitus with hyperglycemia, without long-term current use of insulin ([place]) 01/17/2021   Acute pain of right shoulder 08/18/2020  Primary osteoarthritis of right knee 08/18/2020   Impacted cerumen of right ear 05/24/2020   Bilateral impacted cerumen 05/17/2020   Hyperlipidemia associated with type 2 diabetes mellitus ([place]) 03/10/2020   Cough 03/10/2020   Seasonal allergies 03/10/2020   Elevated rheumatoid factor 07/21/2018   Gastroenteritis 07/21/2018   Trigger middle finger of left hand 12/19/2017   Ganglion cyst of dorsum of right wrist 12/19/2017   Morton's neuroma of right foot 12/19/2017   Preventative health care 12/13/2017   Pain in both hands 12/13/2017   Hyperlipidemia LDL goal <100 12/13/2017   Abscess of upper gum 12/13/2017   Spinal stenosis of cervical region 09/25/2016   Cervical radiculopathy 09/06/2016   Dyspnea 02/18/2016   Chest pain 02/18/2016   HLD (hyperlipidemia) 02/18/2016   CAD in native artery 02/18/2016   RLS (restless legs syndrome) 02/18/2016   Pancreatitis 02/18/2016   SOB (shortness of breath) 02/18/2016   Uncontrolled type 2 diabetes mellitus with complication ([place])    Tobacco abuse    Osteoarthritis of right knee 07/05/2015   Smoking 07/02/2013   Acute upper respiratory infections of unspecified site 07/02/2013   Flu vaccine need 07/02/2013   Multinodular goiter 04/16/2013   Bunion 07/07/2012   BACK PAIN, THORACIC REGION, RIGHT 05/24/2009   IRON DEFICIENCY 03/25/2009   GERD 03/25/2009   WEIGHT LOSS 03/25/2009   SINUSITIS- ACUTE-NOS 11/25/2008   Hyperlipidemia LDL goal <70 10/07/2008   ACUTE PANCREATITIS 09/07/2008   OVARIAN CYST 09/07/2008   KNEE PAIN, RIGHT 05/24/2008   CARPAL TUNNEL SYNDROME, RIGHT 05/12/2008   Acute pain of both shoulders 04/02/2008   ROTATOR CUFF REPAIR, RIGHT, HX OF 03/29/2008   GOITER, MULTINODULAR  12/26/2007   Depression 07/07/2007   HIP PAIN, RIGHT 03/11/2007   RESTLESS LEG SYNDROME, HX OF 03/11/2007   Diabetes mellitus type II, uncontrolled ([place]) 03/07/2007   Essential hypertension 03/07/2007    Immunization History  Administered Date(s) Administered   Fluad Quad(high Dose 65+) 07/28/2019   Influenza Split 08/08/2012   Influenza, High Dose Seasonal PF 08/09/2014, 07/05/2015, 07/21/2018   PFIZER(Purple Top)SARS-COV-2 Vaccination 12/07/2019, 01/04/2020   PPD Test 08/27/2011, 09/04/2011, 07/13/2014, 09/23/2015   Pneumococcal Conjugate-13 08/09/2014   Pneumococcal Polysaccharide-23 11/17/2013   Td 03/11/2007   Zoster, Live 03/11/2007    Conditions to be addressed/monitored: CAD, HTN, HLD, DMII, Anxiety, Depression and shoulder pain; OA of knee; osteoporosis; h/o pancreatitis  Care Plan : General Pharmacy (Adult)  Updates made by Cherre Robins, PHARMD since 04/20/2021 12:00 AM     Problem: Chronic Disease Management support, education, and care coordination needs related to HTN, Hyperlipidemia; CAD, Diabetes, Depression/Anxiety, Osteoporosis, Neuropathy   Priority: High  Onset Date: 02/23/2021  Note:   Current Barriers:  Unable to independently afford treatment regimen Unable to achieve control of type 2 DM  Does not adhere to prescribed medication regimen Chronic Disease Management support, education, and care coordination needs related to HTN, Hyperlipidemia, CAD, Diabetes, Depression/Anxiety, Osteoporosis, Neuropathy  Pharmacist Clinical Goal(s):  Over the next 90 days, patient will verbalize ability to afford treatment regimen achieve control of type 2 DM as evidenced by A1c <7.0% maintain control of HTN and lipids as evidenced by BP <130/80 (per cardio) and LDL <70  adhere to prescribed medication regimen as evidenced by patient report of taking medications daily  through collaboration with PharmD and provider.   Interventions: 1:1 collaboration with Carollee Herter,  Alferd Apa, DO regarding development and update of comprehensive plan of care as evidenced by provider attestation and co-signature Inter-disciplinary care  team collaboration (see longitudinal plan of care) Comprehensive medication review performed; medication list updated in electronic medical record  Hypertension Controlled; BP goal <130/80 Patient checks BP at home infrequently - usually once per week - last reading was 134/80  Patient has failed these meds in the past: None noted  Current regimen:  Amlodipine 23m daily Lisinopril 470mdaily Metoprolol Succinate 5078maily Interventions: Requested patient to check her blood pressure 2 times per week and record Ensure daily salt intake < 2300 mg/day Continue current regimen for blood pressure  Hyperlipidemia / CAD Controlled; LDL goal < 70 Current regimen:  Aspirin 4m39mily Fenofibrate 160mg109mly Omega 3 fish Oil 1030mg 69me daily Rosuvastatin 20mg d2m Interventions: Reviewed last lipid panel with patient. Has maintained LDL at goal and Tg improved from 344 in 2021 to 145 on 12/06/2020 Continue current regimen for heart health and cholesterol Continue to follow low fat diet  Diabetes Lab Results  Component Value Date/Time   HGBA1C 8.4 (H) 02/16/2021 09:37 AM   HGBA1C 9.4 (H) 12/06/2020 04:03 PM  A1c not at goal. Initially goal is to get A1c <8.0 so patient can have shoulder surgery but eventually would like A1c goal <7%as long as doesn't increase hypoglycemia Current regimen:  Humulin 70/30 - 45 units with breakfast and 40 units with evening meal Jardiance 25mg - 1m 1 tablet each morning Past medications - Januvia 100mg dai48mnd glimepiride 2mg qam (70mcontinue by Dr ShamlefferKelton Pillartory of pancreatitis Recent home BG reading:  2 readings last week in 220's (occurred when she misses doses of Jardiance)  Lowest reading was last night when BG was 80 - patient states she felt a little shaky so she ate canned mango  and drank juice it was in.  Patient reports she is in Medicare coverage gap for Jardiance. Jardiance also is no longer included in her morning packaged medications but in a separate bottle. She reports this has caused her to miss doses of Jardiance about twice per week. Interventions: Discussed DM goals Fasting (before meal blood sugar): 80-130 Post-Prandial (2 hrs after eating blood sugar): less than 180 A1c (3 month blood sugar average): less than 7% Requested patient to check blood glucose twice daily Mailed patient assistance application for Jardiance since patient reports she is in Medicare coverage gap. Will coordinate with patient and Dr ShamlefferKelton Pillarne to cEtter Sjogrente and submit PAP application Patient has returned PAP application 04/19/2021.2/11/9417d any missing information and forwarding to PCP for review and signature.  Collaborated with [facility] to find solution to having Jardiance included in medication packaging again to improved adherence.   Depression / anxiety Patient report anxiety and mood are controlled with current regimen Using alprazolam sparingly - last RF was 09/07/2020 for #60 tabs Patient has failed these meds in past: sertraline (palpitations) Current regimen:  Alprazolam 0.25mg three80mes daily as needed  Fluoxetine 40mg daily 83mrventions: (not addressed at this visit) Collaboration with provider regarding medication management (refills needed for alprazolam) - message sent to request refill. Maintain current regimen for depression and anxiety  Osteoporosis  Last DEXA Scan: 05/29/2017             T-Score femoral neck: -2.0             T-Score forearm radius: -2.5 Current regimen:  Alendronate 70mg weekly 25mrventions: Consider repeat DEXA Scan Continue current regimen  Shoulder Pain / osteoarthritis of right knee:  Surgery is planned once A1c is < 8.0% Current regimen:  Tramadol  70m up to 3 times a day if needed for pain Interventions:   Discuss importance of taking diabetes meds daily and getting A1c <8.0 so surgery can be scheduled.  Continue to follow up with orthopedic office for pain management   Medication management Pharmacist Clinical Goal(s): Over the next 90 days, patient will work with PharmD and providers to maintain optimal medication adherence Current pharmacy: [facility] Interventions Comprehensive medication review performed. Utilize [facility] for medication synchronization, packaging and delivery Will coordinate with [facility] to get Jardiance added back to medication pill packs  Patient Goals/Self-Care Activities Over the next 90 days, patient will:  take medications as prescribed, check glucose 2 times per day, document, and provide at future appointments, and collaborate with provider on medication access solutions  Follow Up Plan: Telephone follow up appointment with care management team member scheduled for:  3 months  with clinical pharmacist;  Planned phone outreach by clinical pharmacist assistant in 1 month to check in PAP application and home BG readings.          Medication Assistance: Application for Jardiance  medication assistance program. in process.  Anticipated assistance start date 05/08/2021.  See plan of care for additional detail. Application has been completed by patient and returned to office 04/19/2021. Reviewed application. Forwarding to PCP for review and signature. Will then be faxed to PAP.   Patient's preferred pharmacy is:  [facility] [place] [place]Phone: [phone]: 3[place] [address] [facility]Phone: [phone]: 3[place] [address]Phone: [phone]: [phone]  Follow Up:  Patient agrees to Care Plan and Follow-up.  Plan: Telephone follow up appointment with care management team member scheduled for:  3 months and The care management team will reach out to the patient again over the next 14-21 days. (To check on Jardiance PAP)   TCherre Robins PharmD Clinical Pharmacist [place]

## 2021-04-20 NOTE — Patient Instructions (Signed)
Visit Information  PATIENT GOALS:  Goals Addressed             This Visit's Progress    Chronic Care Management Pharmacy Care Plan   On track    CARE PLAN ENTRY (see longitudinal plan of care for additional care plan information)  Current Barriers:  Chronic Disease Management support, education, and care coordination needs related to Hypertension, Hyperlipidemia/Hx of CAD, Diabetes, Tobacco Use Disorder, Depression/Anxiety, Osteoporosis, Neuropathy   Hypertension BP Readings from Last 3 Encounters:  01/17/21 122/82  12/29/20 118/60  12/06/20 120/60  Pharmacist Clinical Goal(s): Over the next 90 days, patient will work with PharmD and providers to maintain BP goal <130/80 Current regimen:  Amlodipine 10mg  daily Lisinopril 40mg  daily Metoprolol Succinate 50mg  daily Interventions: Requested patient to check her blood pressure 2 times per week and record Patient self care activities - Over the next 90 days, patient will: Check blood pressure 2 times per week, document, and provide at future appointments Ensure daily salt intake < 2300 mg/day Continue current regimen for blood pressure  Hyperlipidemia / Heart Disease Lab Results  Component Value Date/Time   LDLCALC 57 12/06/2020 04:03 PM   LDLCALC 61 06/15/2020 08:19 AM   LDLDIRECT 100.0 03/10/2020 02:10 PM  Pharmacist Clinical Goal(s): Over the next 90 days, patient will work with PharmD and providers to maintain LDL goal < 70 Current regimen:  Aspirin 81mg  daily Fenofibrate 160mg  daily Omega 3 fish Oil 1030mg  twice daily Rosuvastatin 20mg  daily Interventions: Reviewed last lipid panel with patient. Has maintained LDL at goal and Tg improved form 344 in 2021 to 145 on 12/06/2020 Discussed that patient should be taking rosuvastatin vs atorvastatin (This was changed June 2021) Patient self care activities - Over the next 90 days, patient will: Continue current regimen for heart health and cholesterol Continue to follow  low fat diet  Diabetes Lab Results  Component Value Date/Time   HGBA1C 8.4 (H) 02/16/2021 09:37 AM   HGBA1C 9.4 (H) 12/06/2020 04:03 PM  Pharmacist Clinical Goal(s): Over the next 180 days, patient will work with PharmD and providers to achieve A1c goal <7% Current regimen:  Humulin 70/30 - 45 units with breakfast and 40 units with evening meal Jardiance 25mg  - take 1 tablet each morning Interventions: Discussed DM goals Fasting (before meal blood sugar): 80-130 Post-Prandial (2 hrs after eating blood sugar): less than 180 A1c (3 month blood sugar average): less than 7% Requested patient to check blood glucose twice daily Coordinating with patient and Dr or Dr 2022 to complete and submit PAP application Collaborate with [facility] to find solution to having Jardiance included in medication packaging again to improved adherence.  Patient self care activities - Over the next 90 days, patient will: Check blood sugar twice daily, document, and provide at future appointments Contact provider with any episodes of hypoglycemia Complete application for Jardiance patient assistance and return to 02/05/2021, clinical pharmacist with at Dr July 2021 office (done by patient 04/19/2021)  Depression / anxiety Pharmacist Clinical Goal(s) Over the next 90 days, patient will work with PharmD and providers to reduce symptoms associated with depression Current regimen:  Alprazolam 0.25mg  three times daily as needed  Fluoxetine 40mg  daily Interventions: Collaboration with provider regarding medication management (refills needed for alprazolam) - message sent to request refill. Patient self care activities - Over the next 90 days, patient will: Maintain current regimen for depression and anxiety  Osteoporosis Pharmacist Clinical Goal(s) Over the next 180 days, patient will work with  PharmD and providers to reduce risk of fracture due to osteoporosis  Current regimen:  Alendronate  70mg  weekly Interventions: Consider repeat DEXA Scan Patient self care activities - Over the next 180 days, patient will: Consider repeat DEXA Scan  Shoulder Pain:  Pharmacist Clinical Goal(s) Over the next 180 days, patient will work with PharmD and providers to improve pain and improve BG control to A1c <8.0 so patient can be scheduled for needed shoulder surgery Current regimen:  Tramadol 50mg  up to 3 times a day if needed for pain Interventions: Discuss importance of taking diabetes meds daily and getting A1c <8.0 so surgery can be scheduled.  Updated medication list to include tramadol and also removed methocarbamol which patient is not longer taking Patient self care activities - Over the next 180 days, patient will: Continue to follow up with orthopedic office for pain management   Medication management Pharmacist Clinical Goal(s): Over the next 90 days, patient will work with PharmD and providers to maintain optimal medication adherence Current pharmacy: [facility] switched to [facility] Interventions Comprehensive medication review performed. Utilize [facility] pharmacy for medication synchronization, packaging and delivery Patient self care activities - Over the next 90 days, patient will: Focus on medication adherence by filling and taking medications appropriately  Take medications as prescribed Report any questions or concerns to PharmD and/or provider(s)  Please see past updates related to this goal by clicking on the "Past Updates" button in the selected goal          The patient verbalized understanding of instructions, educational materials, and care plan provided today and declined offer to receive copy of patient instructions, educational materials, and care plan.   Telephone follow up appointment with care management team member scheduled for: 14 to 21 days - CMA will check on application status; Clinical pharmacist appt in August 2022.   ,  PharmD Clinical Pharmacist [facility] [phone]

## 2021-04-21 ENCOUNTER — Other Ambulatory Visit: Payer: Self-pay | Admitting: Family Medicine

## 2021-04-21 DIAGNOSIS — E1165 Type 2 diabetes mellitus with hyperglycemia: Secondary | ICD-10-CM

## 2021-04-21 DIAGNOSIS — E785 Hyperlipidemia, unspecified: Secondary | ICD-10-CM

## 2021-04-24 ENCOUNTER — Other Ambulatory Visit: Payer: Self-pay

## 2021-04-24 ENCOUNTER — Ambulatory Visit (INDEPENDENT_AMBULATORY_CARE_PROVIDER_SITE_OTHER): Payer: Medicare HMO | Admitting: Family Medicine

## 2021-04-24 VITALS — BP 130/62 | HR 62 | Temp 98.8°F | Resp 18 | Ht 65.0 in | Wt 175.4 lb

## 2021-04-24 DIAGNOSIS — E785 Hyperlipidemia, unspecified: Secondary | ICD-10-CM | POA: Diagnosis not present

## 2021-04-24 DIAGNOSIS — E1165 Type 2 diabetes mellitus with hyperglycemia: Secondary | ICD-10-CM | POA: Diagnosis not present

## 2021-04-24 DIAGNOSIS — E1169 Type 2 diabetes mellitus with other specified complication: Secondary | ICD-10-CM

## 2021-04-24 NOTE — Progress Notes (Signed)
Patient ID: [name], female    DOB: 08-27-1946  Age: 75 y.o. MRN: [id number]    Subjective:  Subjective  HPI [name] presents for an office visit and surgical clearance today. She reports feeling well and has no complaints. Pt is planning on receiving surgery on her R shoulder to manage pain. Pt is waiting on her A1c to go below 8.0. She states that her A1c  at home are both high (over 200) and low (3)  Lab Results  Component Value Date   HGBA1C 8.4 (H) 04/24/2021  She denies any chest pain, SOB, fever, abdominal pain, cough, chills, sore throat, dysuria, urinary incontinence, back pain, HA, or N/V/D at this time.   Review of Systems  Constitutional:  Negative for chills, fatigue and fever.  HENT:  Negative for ear pain, rhinorrhea, sinus pressure, sinus pain, sore throat and tinnitus.   Eyes:  Negative for pain.  Respiratory:  Negative for cough, shortness of breath and wheezing.   Cardiovascular:  Negative for chest pain.  Gastrointestinal:  Negative for abdominal pain, anal bleeding, constipation, diarrhea, nausea and vomiting.  Genitourinary:  Negative for flank pain.  Musculoskeletal:  Negative for back pain and neck pain.  Skin:  Negative for rash.  Neurological:  Negative for seizures, weakness, light-headedness, numbness and headaches.   History Past Medical History:  Diagnosis Date   Blockage of coronary artery of heart (HCC)    Depression    Diabetes mellitus    Hyperlipidemia    Hypertension    Pancreatitis    Restless leg syndrome     She has a past surgical history that includes Tubal ligation; Rotator cuff repair; Coronary angioplasty with stent; Foot surgery (Right); Cataract extraction, bilateral; and Rotator cuff repair (Left).   Her family history includes Cancer in her mother and sister; Cirrhosis in her sister; Diabetes in her daughter, daughter, sister, sister, and son; Goiter in her daughter; Gout in her sister; Heart Problems in her  sister; Heart attack in her brother, father, and son; Hypertension in her mother and sister; Sleep apnea in her sister; Vision loss in her mother.She reports that she quit smoking about a year ago. Her smoking use included cigarettes. She smoked an average of .2 packs per day. She has never used smokeless tobacco. She reports that she does not drink alcohol and does not use drugs.  Current Outpatient Medications on File Prior to Visit  Medication Sig Dispense Refill   Alcohol Swabs (B-D SINGLE USE SWABS REGULAR) PADS USE AS DIRECTED 400 each 1   alendronate (FOSAMAX) 70 MG tablet Take with a full glass of water on an empty stomach. 12 tablet 2   ALPRAZolam (XANAX) 0.25 MG tablet Take 1 tablet by mouth three times a day if needed 60 tablet 0   amLODipine (NORVASC) 10 MG tablet TAKE ONE TABLET BY MOUTH ONCE DAILY 90 tablet 1   aspirin EC 81 MG tablet Take 81 mg by mouth daily.     Blood Glucose Calibration (TAI DOC CONTROL) NORMAL SOLN      Blood Glucose Monitoring Suppl (TRUE METRIX AIR GLUCOSE METER) w/Device KIT 1 each by Does not apply route 4 (four) times daily. DX CODE E11.8 1 kit 0   Carboxymethylcellulose Sodium (THERATEARS OP) Place 1 drop into both eyes daily as needed (dry eyes).     empagliflozin (JARDIANCE) 25 MG TABS tablet Take 1 tablet (25 mg total) by mouth daily before breakfast. 90 tablet 3   fenofibrate 160  MG tablet TAKE ONE TABLET BY MOUTH EVERY EVENING 90 tablet 1   ferrous sulfate 325 (65 FE) MG tablet Take 325 mg by mouth daily with breakfast.      Flaxseed, Linseed, (FLAXSEED OIL PO) Take 1 capsule by mouth 2 (two) times daily.      FLUoxetine (PROZAC) 40 MG capsule TAKE ONE CAPSULE BY MOUTH ONCE DAILY 90 capsule 1   fluticasone (FLONASE) 50 MCG/ACT nasal spray SHAKE LIQUID AND USE 2 SPRAYS IN EACH NOSTRIL DAILY 48 g 1   glucose blood (TRUE METRIX BLOOD GLUCOSE TEST) test strip USE AS DIRECTED 4 TIMES A DAY 400 strip 1   HYDROcodone-acetaminophen (NORCO) 5-325 MG tablet  Take 1-2 tablets by mouth daily as needed. 20 tablet 0   HYDROcodone-acetaminophen (NORCO) 5-325 MG tablet Take 1 tablet by mouth every 6 (six) hours as needed for moderate pain. 30 tablet 0   Insulin Pen Needle 32G X 4 MM MISC 1 Device by Does not apply route 2 (two) times daily. 200 each 3   Lancet Devices (LANCING DEVICE) MISC      levocetirizine (XYZAL) 5 MG tablet TAKE 1 TABLET(5 MG) BY MOUTH EVERY EVENING 90 tablet 1   lisinopril (ZESTRIL) 40 MG tablet Take 1 tablet (40 mg total) by mouth daily. 90 tablet 3   meloxicam (MOBIC) 7.5 MG tablet Take 1 tablet (7.5 mg total) by mouth daily as needed for pain. 30 tablet 0   methocarbamol (ROBAXIN) 500 MG tablet Take 1 tablet (500 mg total) by mouth 2 (two) times daily as needed. 20 tablet 0   metoprolol succinate (TOPROL-XL) 50 MG 24 hr tablet TAKE ONE TABLET BY MOUTH ONCE DAILY WITH FOOD 90 tablet 1   Multiple Vitamin (MULTIVITAMIN WITH MINERALS) TABS Take 1 tablet by mouth daily.     NOVOLIN 70/30 FLEXPEN (70-30) 100 UNIT/ML KwikPen Inject 45 Units into the skin daily before breakfast AND 40 Units daily before supper. 75 mL 3   Omega-3 Fatty Acids (FISH OIL) 1000 MG CAPS Take by mouth.     rosuvastatin (CRESTOR) 20 MG tablet TAKE ONE TABLET BY MOUTH EVERY EVENING 90 tablet 1   traMADol (ULTRAM) 50 MG tablet Take 1 tablet (50 mg total) by mouth 3 (three) times daily as needed. 30 tablet 0   TRUEplus Lancets 33G MISC USE AS DIRECTED 4 TIMES A DAY 400 each 1   vitamin E 400 UNIT capsule Take 400 Units by mouth daily.     No current facility-administered medications on file prior to visit.     Objective:  Objective  Physical Exam Vitals and nursing note reviewed.  Constitutional:      General: She is not in acute distress.    Appearance: Normal appearance. She is well-developed. She is not ill-appearing.  HENT:     Head: Normocephalic and atraumatic.     Right Ear: External ear normal.     Left Ear: External ear normal.     Nose: Nose  normal.  Eyes:     General:        Right eye: No discharge.        Left eye: No discharge.     Extraocular Movements: Extraocular movements intact.     Pupils: Pupils are equal, round, and reactive to light.  Cardiovascular:     Rate and Rhythm: Normal rate and regular rhythm.     Pulses: Normal pulses.     Heart sounds: Normal heart sounds. No murmur heard.   No friction  rub. No gallop.  Pulmonary:     Effort: Pulmonary effort is normal. No respiratory distress.     Breath sounds: Normal breath sounds. No stridor. No wheezing, rhonchi or rales.  Chest:     Chest wall: No tenderness.  Abdominal:     General: Bowel sounds are normal. There is no distension.     Palpations: Abdomen is soft. There is no mass.     Tenderness: There is no abdominal tenderness. There is no guarding or rebound.     Hernia: No hernia is present.  Musculoskeletal:        General: Normal range of motion.     Cervical back: Normal range of motion and neck supple.     Right lower leg: No edema.     Left lower leg: No edema.  Skin:    General: Skin is warm and dry.  Neurological:     Mental Status: She is alert and oriented to person, place, and time.  Psychiatric:        Behavior: Behavior normal.        Thought Content: Thought content normal.   BP 130/62 (BP Location: Right Arm, Patient Position: Sitting, Cuff Size: Normal)   Pulse 62   Temp 98.8 F (37.1 C) (Oral)   Resp 18   Ht '5\' 5"'  (1.651 m)   Wt 175 lb 6.4 oz (79.6 kg)   SpO2 97%   BMI 29.19 kg/m  Wt Readings from Last 3 Encounters:  04/24/21 175 lb 6.4 oz (79.6 kg)  01/27/21 177 lb (80.3 kg)  01/17/21 177 lb 4 oz (80.4 kg)     Lab Results  Component Value Date   WBC 7.0 12/06/2020   HGB 12.2 12/06/2020   HCT 36.6 12/06/2020   PLT 182.0 12/06/2020   GLUCOSE 87 04/24/2021   CHOL 116 04/24/2021   TRIG 271.0 (H) 04/24/2021   HDL 28.70 (L) 04/24/2021   LDLDIRECT 53.0 04/24/2021   LDLCALC 57 12/06/2020   ALT 20 04/24/2021   AST  21 04/24/2021   NA 138 04/24/2021   K 3.7 04/24/2021   CL 103 04/24/2021   CREATININE 0.94 04/24/2021   BUN 22 04/24/2021   CO2 26 04/24/2021   TSH 0.58 08/11/2018   INR 1.1 03/03/2009   HGBA1C 8.4 (H) 04/24/2021   MICROALBUR 12.2 (H) 12/06/2020    DG Chest 2 View  Result Date: 09/23/2020 CLINICAL DATA:  Shortness of breath. Tooth surgery on 1 stay. Vomiting. EXAM: CHEST - 2 VIEW COMPARISON:  Chest x-ray 08/11/2018 FINDINGS: The heart size and mediastinal contours are within normal limits. Vague streaky opacity at the bases. No definite focal consolidation. No pulmonary edema. No pleural effusion. No pneumothorax. No acute osseous abnormality. Multilevel degenerative changes of the spine. IMPRESSION: Vague streaky opacity at the bases that likely represents differential diagnosis includes inflammation (such as aspiration pneumonia) or infection. Electronically Signed   By: Iven Finn M.D.   On: 09/23/2020 15:20     Assessment & Plan:  Plan    No orders of the defined types were placed in this encounter.   Problem List Items Addressed This Visit       Unprioritized   Hyperlipidemia associated with type 2 diabetes mellitus ([place])   Relevant Orders   Lipid panel (Completed)   Hemoglobin A1c (Completed)   Comprehensive metabolic panel (Completed)   Diabetes mellitus type II, uncontrolled ([place]) - Primary    Check labs today F/u endo  Relevant Orders   Lipid panel (Completed)   Hemoglobin A1c (Completed)   Comprehensive metabolic panel (Completed)   Hyperlipidemia LDL goal <70    Encourage heart healthy diet such as MIND or DASH diet, increase exercise, avoid trans fats, simple carbohydrates and processed foods, consider a krill or fish or flaxseed oil cap daily.         Follow-up: Return in about 3 months (around 07/25/2021), or if symptoms worsen or fail to improve.   I,Gordon Zheng,acting as a Education administrator for [employer], DO.,have documented all  relevant documentation on the behalf of Ann Held, DO,as directed by  Ann Held, DO while in the presence of [place], DO, have reviewed all documentation for this visit. The documentation on 04/25/21 for the exam, diagnosis, procedures, and orders are all accurate and complete.

## 2021-04-24 NOTE — Patient Instructions (Signed)
https://www.diabeteseducator.org/docs/default-source/living-with-diabetes/conquering-the-grocery-store-v1.pdf?sfvrsn=4">  Carbohydrate Counting for Diabetes Mellitus, Adult Carbohydrate counting is a method of keeping track of how many carbohydrates you eat. Eating carbohydrates naturally increases the amount of sugar (glucose) in the blood. Counting how many carbohydrates you eat improves your bloodglucose control, which helps you manage your diabetes. It is important to know how many carbohydrates you can safely have in each meal. This is different for every person. A dietitian can help you make a meal plan and calculate how many carbohydrates you should have at each meal andsnack. What foods contain carbohydrates? Carbohydrates are found in the following foods: Grains, such as breads and cereals. Dried beans and soy products. Starchy vegetables, such as potatoes, peas, and corn. Fruit and fruit juices. Milk and yogurt. Sweets and snack foods, such as cake, cookies, candy, chips, and soft drinks. How do I count carbohydrates in foods? There are two ways to count carbohydrates in food. You can read food labels or learn standard serving sizes of foods. You can use either of the methods or acombination of both. Using the Nutrition Facts label The Nutrition Facts list is included on the labels of almost all packaged foods and beverages in the [place] It includes: The serving size. Information about nutrients in each serving, including the grams (g) of carbohydrate per serving. To use the Nutrition Facts: Decide how many servings you will have. Multiply the number of servings by the number of carbohydrates per serving. The resulting number is the total amount of carbohydrates that you will be having. Learning the standard serving sizes of foods When you eat carbohydrate foods that are not packaged or do not include Nutrition Facts on the label, you need to measure the servings in order to count the  amount of carbohydrates. Measure the foods that you will eat with a food scale or measuring cup, if needed. Decide how many standard-size servings you will eat. Multiply the number of servings by 15. For foods that contain carbohydrates, one serving equals 15 g of carbohydrates. For example, if you eat 2 cups or 10 oz (300 g) of strawberries, you will have eaten 2 servings and 30 g of carbohydrates (2 servings x 15 g = 30 g). For foods that have more than one food mixed, such as soups and casseroles, you must count the carbohydrates in each food that is included. The following list contains standard serving sizes of common carbohydrate-rich foods. Each of these servings has about 15 g of carbohydrates: 1 slice of bread. 1 six-inch (15 cm) tortilla. ? cup or 2 oz (53 g) cooked rice or pasta.  cup or 3 oz (85 g) cooked or canned, drained and rinsed beans or lentils.  cup or 3 oz (85 g) starchy vegetable, such as peas, corn, or squash.  cup or 4 oz (120 g) hot cereal.  cup or 3 oz (85 g) boiled or mashed potatoes, or  or 3 oz (85 g) of a large baked potato.  cup or 4 fl oz (118 mL) fruit juice. 1 cup or 8 fl oz (237 mL) milk. 1 small or 4 oz (106 g) apple.  or 2 oz (63 g) of a medium banana. 1 cup or 5 oz (150 g) strawberries. 3 cups or 1 oz (24 g) popped popcorn. What is an example of carbohydrate counting? To calculate the number of carbohydrates in this sample meal, follow the stepsshown below. Sample meal 3 oz (85 g) chicken breast. ? cup or 4 oz (106 g) brown rice.    cup or 3 oz (85 g) corn. 1 cup or 8 fl oz (237 mL) milk. 1 cup or 5 oz (150 g) strawberries with sugar-free whipped topping. Carbohydrate calculation Identify the foods that contain carbohydrates: Rice. Corn. Milk. Strawberries. Calculate how many servings you have of each food: 2 servings rice. 1 serving corn. 1 serving milk. 1 serving strawberries. Multiply each number of servings by 15 g: 2 servings  rice x 15 g = 30 g. 1 serving corn x 15 g = 15 g. 1 serving milk x 15 g = 15 g. 1 serving strawberries x 15 g = 15 g. Add together all of the amounts to find the total grams of carbohydrates eaten: 30 g + 15 g + 15 g + 15 g = 75 g of carbohydrates total. What are tips for following this plan? Shopping Develop a meal plan and then make a shopping list. Buy fresh and frozen vegetables, fresh and frozen fruit, dairy, eggs, beans, lentils, and whole grains. Look at food labels. Choose foods that have more fiber and less sugar. Avoid processed foods and foods with added sugars. Meal planning Aim to have the same amount of carbohydrates at each meal and for each snack time. Plan to have regular, balanced meals and snacks. Where to find more information American Diabetes Association: www.diabetes.org Centers for Disease Control and Prevention: www.cdc.gov Summary Carbohydrate counting is a method of keeping track of how many carbohydrates you eat. Eating carbohydrates naturally increases the amount of sugar (glucose) in the blood. Counting how many carbohydrates you eat improves your blood glucose control, which helps you manage your diabetes. A dietitian can help you make a meal plan and calculate how many carbohydrates you should have at each meal and snack. This information is not intended to replace advice given to you by your health care provider. Make sure you discuss any questions you have with your healthcare provider. Document Revised: 09/24/2019 Document Reviewed: 09/25/2019 Elsevier Patient Education  2021 Elsevier Inc.  

## 2021-04-25 LAB — COMPREHENSIVE METABOLIC PANEL
ALT: 20 U/L (ref 0–35)
AST: 21 U/L (ref 0–37)
Albumin: 4.4 g/dL (ref 3.5–5.2)
Alkaline Phosphatase: 35 U/L — ABNORMAL LOW (ref 39–117)
BUN: 22 mg/dL (ref 6–23)
CO2: 26 mEq/L (ref 19–32)
Calcium: 10.1 mg/dL (ref 8.4–10.5)
Chloride: 103 mEq/L (ref 96–112)
Creatinine, Ser: 0.94 mg/dL (ref 0.40–1.20)
GFR: 59.45 mL/min — ABNORMAL LOW (ref >60.00)
Glucose, Bld: 87 mg/dL (ref 70–99)
Potassium: 3.7 mEq/L (ref 3.5–5.1)
Sodium: 138 mEq/L (ref 135–145)
Total Bilirubin: 0.4 mg/dL (ref 0.2–1.2)
Total Protein: 6.7 g/dL (ref 6.0–8.3)

## 2021-04-25 LAB — LIPID PANEL
Cholesterol: 116 mg/dL (ref 0–200)
HDL: 28.7 mg/dL — ABNORMAL LOW (ref >39.00)
NonHDL: 87.43
Total CHOL/HDL Ratio: 4
Triglycerides: 271 mg/dL — ABNORMAL HIGH (ref 0.0–149.0)
VLDL: 54.2 mg/dL — ABNORMAL HIGH (ref 0.0–40.0)

## 2021-04-25 LAB — HEMOGLOBIN A1C: Hgb A1c MFr Bld: 8.4 % — ABNORMAL HIGH (ref 4.6–6.5)

## 2021-04-25 LAB — LDL CHOLESTEROL, DIRECT: Direct LDL: 53 mg/dL

## 2021-04-25 NOTE — Assessment & Plan Note (Signed)
Encourage heart healthy diet such as MIND or DASH diet, increase exercise, avoid trans fats, simple carbohydrates and processed foods, consider a krill or fish or flaxseed oil cap daily.  °

## 2021-04-25 NOTE — Assessment & Plan Note (Signed)
Check labs today F/u endo

## 2021-04-28 ENCOUNTER — Telehealth: Payer: Self-pay | Admitting: Family Medicine

## 2021-04-28 NOTE — Telephone Encounter (Signed)
See below

## 2021-04-28 NOTE — Telephone Encounter (Signed)
Patient given results. Documented under result notes as well.

## 2021-04-28 NOTE — Telephone Encounter (Signed)
Patient would like lab results.

## 2021-05-02 ENCOUNTER — Ambulatory Visit: Payer: Medicare HMO | Admitting: Internal Medicine

## 2021-05-04 ENCOUNTER — Encounter (HOSPITAL_BASED_OUTPATIENT_CLINIC_OR_DEPARTMENT_OTHER): Payer: Self-pay

## 2021-05-04 ENCOUNTER — Emergency Department (HOSPITAL_BASED_OUTPATIENT_CLINIC_OR_DEPARTMENT_OTHER): Payer: Medicare HMO

## 2021-05-04 ENCOUNTER — Other Ambulatory Visit (HOSPITAL_BASED_OUTPATIENT_CLINIC_OR_DEPARTMENT_OTHER): Payer: Self-pay

## 2021-05-04 ENCOUNTER — Emergency Department (HOSPITAL_BASED_OUTPATIENT_CLINIC_OR_DEPARTMENT_OTHER)
Admission: EM | Admit: 2021-05-04 | Discharge: 2021-05-04 | Disposition: A | Discharge status: Home / Self Care | Payer: Medicare HMO | Attending: Emergency Medicine | Admitting: Emergency Medicine

## 2021-05-04 ENCOUNTER — Other Ambulatory Visit: Payer: Self-pay

## 2021-05-04 DIAGNOSIS — Z7984 Long term (current) use of oral hypoglycemic drugs: Secondary | ICD-10-CM | POA: Diagnosis not present

## 2021-05-04 DIAGNOSIS — M5416 Radiculopathy, lumbar region: Secondary | ICD-10-CM

## 2021-05-04 DIAGNOSIS — Z79899 Other long term (current) drug therapy: Secondary | ICD-10-CM | POA: Diagnosis not present

## 2021-05-04 DIAGNOSIS — I10 Essential (primary) hypertension: Secondary | ICD-10-CM | POA: Diagnosis not present

## 2021-05-04 DIAGNOSIS — M79605 Pain in left leg: Secondary | ICD-10-CM | POA: Diagnosis not present

## 2021-05-04 DIAGNOSIS — Z7982 Long term (current) use of aspirin: Secondary | ICD-10-CM | POA: Insufficient documentation

## 2021-05-04 DIAGNOSIS — Z794 Long term (current) use of insulin: Secondary | ICD-10-CM | POA: Diagnosis not present

## 2021-05-04 DIAGNOSIS — M7989 Other specified soft tissue disorders: Secondary | ICD-10-CM | POA: Diagnosis not present

## 2021-05-04 DIAGNOSIS — Z87891 Personal history of nicotine dependence: Secondary | ICD-10-CM | POA: Insufficient documentation

## 2021-05-04 DIAGNOSIS — E119 Type 2 diabetes mellitus without complications: Secondary | ICD-10-CM | POA: Diagnosis not present

## 2021-05-04 DIAGNOSIS — M25561 Pain in right knee: Secondary | ICD-10-CM | POA: Diagnosis not present

## 2021-05-04 DIAGNOSIS — M545 Low back pain, unspecified: Secondary | ICD-10-CM | POA: Diagnosis not present

## 2021-05-04 DIAGNOSIS — M549 Dorsalgia, unspecified: Secondary | ICD-10-CM | POA: Diagnosis not present

## 2021-05-04 MED ORDER — METHOCARBAMOL 500 MG PO TABS
500.0000 mg | ORAL_TABLET | Freq: Three times a day (TID) | ORAL | 0 refills | Status: DC | PRN
  Filled 2021-05-04: qty 15, 5d supply, fill #0

## 2021-05-04 MED ORDER — PREDNISONE 20 MG PO TABS: ORAL_TABLET | ORAL | 0 refills | Status: DC

## 2021-05-04 MED ORDER — IBUPROFEN 800 MG PO TABS
800.0000 mg | ORAL_TABLET | Freq: Once | ORAL | Status: AC
  Administered 2021-05-04: 800 mg via ORAL
  Filled 2021-05-04: qty 1

## 2021-05-04 MED ORDER — PREDNISONE 20 MG PO TABS
ORAL_TABLET | ORAL | 0 refills | Status: DC
  Filled 2021-05-04: qty 12, 6d supply, fill #0

## 2021-05-04 MED ORDER — METHOCARBAMOL 500 MG PO TABS: 500.0000 mg | ORAL_TABLET | Freq: Three times a day (TID) | ORAL | 0 refills | Status: DC | PRN

## 2021-05-04 NOTE — ED Provider Notes (Signed)
[facility] Provider Note   CSN: [id number] Arrival date & time: 05/04/21  1550     History Chief Complaint  Patient presents with   Leg Pain    [name] is a 75 y.o. female history of depression, hyperlipidemia, hypertension, restless leg syndrome here presenting with back pain.  Patient has chronic back pain.  Patient states that for the last week or so the pain began became worse.  Patient states that she did not fall or injure her back.  She states that the pain radiates down the left leg.  She states that the left leg is more swollen.  She also has pain in the right knee as well.  Patient seen Ortho outpatient and was ordered outpatient MRI.  However she denies any incontinence.  The history is provided by the patient.      Past Medical History:  Diagnosis Date   Blockage of coronary artery of heart (HCC)    Depression    Diabetes mellitus    Hyperlipidemia    Hypertension    Pancreatitis    Restless leg syndrome     Patient Active Problem List   Diagnosis Date Noted   Diabetes mellitus ([place]) 01/17/2021   Type 2 diabetes mellitus with hyperglycemia, without long-term current use of insulin ([place]) 01/17/2021   Acute pain of right shoulder 08/18/2020   Primary osteoarthritis of right knee 08/18/2020   Impacted cerumen of right ear 05/24/2020   Bilateral impacted cerumen 05/17/2020   Hyperlipidemia associated with type 2 diabetes mellitus ([place]) 03/10/2020   Cough 03/10/2020   Seasonal allergies 03/10/2020   Elevated rheumatoid factor 07/21/2018   Gastroenteritis 07/21/2018   Trigger middle finger of left hand 12/19/2017   Ganglion cyst of dorsum of right wrist 12/19/2017   Morton's neuroma of right foot 12/19/2017   Preventative health care 12/13/2017   Pain in both hands 12/13/2017   Hyperlipidemia LDL goal <100 12/13/2017   Abscess of upper gum 12/13/2017   Spinal stenosis of cervical region 09/25/2016   Cervical  radiculopathy 09/06/2016   Dyspnea 02/18/2016   Chest pain 02/18/2016   HLD (hyperlipidemia) 02/18/2016   CAD in native artery 02/18/2016   RLS (restless legs syndrome) 02/18/2016   Pancreatitis 02/18/2016   SOB (shortness of breath) 02/18/2016   Uncontrolled type 2 diabetes mellitus with complication ([place])    Tobacco abuse    Osteoarthritis of right knee 07/05/2015   Smoking 07/02/2013   Acute upper respiratory infections of unspecified site 07/02/2013   Flu vaccine need 07/02/2013   Multinodular goiter 04/16/2013   Bunion 07/07/2012   BACK PAIN, THORACIC REGION, RIGHT 05/24/2009   IRON DEFICIENCY 03/25/2009   GERD 03/25/2009   WEIGHT LOSS 03/25/2009   SINUSITIS- ACUTE-NOS 11/25/2008   Hyperlipidemia LDL goal <70 10/07/2008   ACUTE PANCREATITIS 09/07/2008   OVARIAN CYST 09/07/2008   KNEE PAIN, RIGHT 05/24/2008   CARPAL TUNNEL SYNDROME, RIGHT 05/12/2008   Acute pain of both shoulders 04/02/2008   ROTATOR CUFF REPAIR, RIGHT, HX OF 03/29/2008   GOITER, MULTINODULAR 12/26/2007   Depression 07/07/2007   HIP PAIN, RIGHT 03/11/2007   RESTLESS LEG SYNDROME, HX OF 03/11/2007   Diabetes mellitus type II, uncontrolled ([place]) 03/07/2007   Essential hypertension 03/07/2007    Past Surgical History:  Procedure Laterality Date   CATARACT EXTRACTION, BILATERAL     CORONARY ANGIOPLASTY WITH STENT PLACEMENT     FOOT SURGERY Right    cyst removal   ROTATOR CUFF REPAIR  right   ROTATOR CUFF REPAIR Left    TUBAL LIGATION       OB History   No obstetric history on file.     Family History  Problem Relation Age of Onset   Heart attack Father    Heart attack Brother    Diabetes Sister    Cirrhosis Sister    Hypertension Sister    Cancer Mother    Hypertension Mother    Vision loss Mother    Cancer Sister    Sleep apnea Sister    Heart Problems Sister    Diabetes Sister    Gout Sister    Diabetes Son    Heart attack Son    Diabetes Daughter    Goiter Daughter     Diabetes Daughter     Social History   Tobacco Use   Smoking status: Former    Packs/day: 0.20    Types: Cigarettes    Quit date: 05/05/2020    Years since quitting: 0.9   Smokeless tobacco: Never   Tobacco comments:    quit in 2021  Vaping Use   Vaping Use: Never used  Substance Use Topics   Alcohol use: No   Drug use: No    Home Medications Prior to Admission medications   Medication Sig Start Date End Date Taking? Authorizing Provider  Alcohol Swabs (B-D SINGLE USE SWABS REGULAR) PADS USE AS DIRECTED 03/15/20   Carollee Herter, Alferd Apa, DO  alendronate (FOSAMAX) 70 MG tablet Take with a full glass of water on an empty stomach. 06/09/20   Ann Held, DO  ALPRAZolam Duanne Moron) 0.25 MG tablet Take 1 tablet by mouth three times a day if needed 02/28/21   Carollee Herter, Kendrick Fries R, DO  amLODipine (NORVASC) 10 MG tablet TAKE ONE TABLET BY MOUTH ONCE DAILY 03/27/21   Carollee Herter, Alferd Apa, DO  aspirin EC 81 MG tablet Take 81 mg by mouth daily.    [provider]  Blood Glucose Calibration (TAI DOC CONTROL) NORMAL SOLN  10/19/12   [provider]  Blood Glucose Monitoring Suppl (TRUE METRIX AIR GLUCOSE METER) w/Device KIT 1 each by Does not apply route 4 (four) times daily. 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    [provider]  empagliflozin (JARDIANCE) 25 MG TABS tablet Take 1 tablet (25 mg total) by mouth daily before breakfast. 01/17/21   Shamleffer, Melanie Crazier, MD  fenofibrate 160 MG tablet TAKE ONE TABLET BY MOUTH EVERY EVENING 04/21/21   Carollee Herter, Yvonne R, DO  ferrous sulfate 325 (65 FE) MG tablet Take 325 mg by mouth daily with breakfast.     [provider]  Flaxseed, Linseed, (FLAXSEED OIL PO) Take 1 capsule by mouth 2 (two) times daily.     [provider]  FLUoxetine (PROZAC) 40 MG capsule TAKE ONE CAPSULE BY  MOUTH ONCE DAILY 03/27/21   Carollee Herter, Alferd Apa, DO  fluticasone (FLONASE) 50 MCG/ACT nasal spray SHAKE LIQUID AND USE 2 SPRAYS IN [facility] NOSTRIL DAILY 08/23/20   Roma Schanz R, DO  glucose blood (TRUE METRIX BLOOD GLUCOSE TEST) test strip USE AS DIRECTED 4 TIMES A DAY 10/27/19   Carollee Herter, Alferd Apa, DO  HYDROcodone-acetaminophen (NORCO) 5-325 MG tablet Take 1-2 tablets by mouth daily as needed. 10/28/20   Aundra Dubin, PA-C  HYDROcodone-acetaminophen (NORCO) 5-325 MG  tablet Take 1 tablet by mouth every 6 (six) hours as needed for moderate pain. 02/28/21   Ann Held, DO  Insulin Pen Needle 32G X 4 MM MISC 1 Device by Does not apply route 2 (two) times daily. 01/17/21   Shamleffer, Melanie Crazier, MD  Lancet Devices (LANCING DEVICE) MISC  10/19/12   [provider]  levocetirizine (XYZAL) 5 MG tablet TAKE 1 TABLET(5 MG) BY MOUTH EVERY EVENING 04/11/21   Carollee Herter, Yvonne R, DO  lisinopril (ZESTRIL) 40 MG tablet Take 1 tablet (40 mg total) by mouth daily. 07/29/20   Buford Dresser, MD  meloxicam (MOBIC) 7.5 MG tablet Take 1 tablet (7.5 mg total) by mouth daily as needed for pain. 09/12/20   Ann Held, DO  methocarbamol (ROBAXIN) 500 MG tablet Take 1 tablet (500 mg total) by mouth 2 (two) times daily as needed. 01/27/21   Aundra Dubin, PA-C  metoprolol succinate (TOPROL-XL) 50 MG 24 hr tablet TAKE ONE TABLET BY MOUTH ONCE DAILY WITH FOOD 03/27/21   Ann Held, DO  Multiple Vitamin (MULTIVITAMIN WITH MINERALS) TABS Take 1 tablet by mouth daily.    [provider]  NOVOLIN 70/30 FLEXPEN (70-30) 100 UNIT/ML KwikPen Inject 45 Units into the skin daily before breakfast AND 40 Units daily before supper. 01/17/21   Shamleffer, Melanie Crazier, MD  Omega-3 Fatty Acids (FISH OIL) 1000 MG CAPS Take by mouth.    [provider]  rosuvastatin (CRESTOR) 20 MG tablet TAKE ONE TABLET BY MOUTH EVERY EVENING 03/27/21   Carollee Herter, Alferd Apa, DO   traMADol (ULTRAM) 50 MG tablet Take 1 tablet (50 mg total) by mouth 3 (three) times daily as needed. 01/05/21   Aundra Dubin, PA-C  TRUEplus Lancets 33G MISC USE AS DIRECTED 4 TIMES A DAY 10/27/19   Roma Schanz R, DO  vitamin E 400 UNIT capsule Take 400 Units by mouth daily.    [provider]    Allergies    Metformin and related, Levemir [insulin detemir], and Clindamycin  Review of Systems   Review of Systems  Cardiovascular:  Positive for leg swelling.  Musculoskeletal:  Positive for back pain.  All other systems reviewed and are negative.  Physical Exam Updated Vital Signs BP (!) 167/61 (BP Location: Left Arm)   Pulse (!) 59   Temp 97.7 F (36.5 C) (Oral)   Resp 18   Ht '5\' 5"'  (1.651 m)   Wt 79.4 kg   SpO2 99%   BMI 29.12 kg/m   Physical Exam Vitals and nursing note reviewed.  Constitutional:      Appearance: Normal appearance.  HENT:     Head: Normocephalic.     Nose: Nose normal.     Mouth/Throat:     Mouth: Mucous membranes are moist.  Eyes:     Extraocular Movements: Extraocular movements intact.     Pupils: Pupils are equal, round, and reactive to light.  Cardiovascular:     Rate and Rhythm: Normal rate and regular rhythm.     Pulses: Normal pulses.     Heart sounds: Normal heart sounds.  Pulmonary:     Effort: Pulmonary effort is normal.     Breath sounds: Normal breath sounds.  Abdominal:     General: Abdomen is flat.     Palpations: Abdomen is soft.  Musculoskeletal:     Cervical back: Normal range of motion and neck supple.     Comments: Left leg with  trace edema.  Patient has mild left paralumbar tenderness.  No saddle anesthesia.  Patient has mild right knee effusion but no obvious septic knee  Skin:    General: Skin is warm.  Neurological:     General: No focal deficit present.     Mental Status: She is alert and oriented to person, place, and time.     Comments: No saddle anesthesia.  Normal reflexes bilaterally   Psychiatric:        Mood and Affect: Mood normal.        Behavior: Behavior normal.    ED Results / Procedures / Treatments   Labs (all labs ordered are listed, but only abnormal results are displayed) Labs Reviewed - No data to display  EKG None  Radiology CT Lumbar Spine Wo Contrast  Result Date: 05/04/2021 CLINICAL DATA:  Low back pain, > 6 wks Lumbar radiculopathy, no red flags EXAM: CT LUMBAR SPINE WITHOUT CONTRAST TECHNIQUE: Multidetector CT imaging of the lumbar spine was performed without intravenous contrast administration. Multiplanar CT image reconstructions were also generated. COMPARISON:  Radiograph 01/27/2021. FINDINGS: Segmentation: For the purposes of this dictation, the lowest well-formed disc space is labeled as L5-S1, though note there are 6 non rib-bearing lumbar type vertebrae. Alignment: Normal Vertebrae: There is no evidence of acute fracture. There is no aggressive bone lesion. Paraspinal and other soft tissues: Aortoiliac atherosclerotic calcifications. No AAA. Disc levels: T12-L1: No significant spinal canal or neural foraminal narrowing. L1-L2: Minimal disc bulging. No significant spinal canal or neural foraminal narrowing L2-L3: Mild disc bulging. No significant spinal canal or neural from L3-L4: Narrowing. Mild disc bulging and ligamentum flavum hypertrophy and facet arthropathy result in mild spinal canal and bilateral neural foraminal narrowing. L4-L5: Asymmetric left disc bulging, ligamentum flavum hypertrophy, and facet arthropathy results in mild spinal canal narrowing and bilateral neural foraminal narrowing. L5-S1: Mild disc bulging, ligamentum flavum hypertrophy, facet arthropathy results in no significant spinal canal narrowing and bilateral neural foraminal narrowing. IMPRESSION: Mild spinal canal narrowing at L3-L4 and L4-L5 and bilateral neural foraminal narrowing at L3-L4, L4-5, and L5-S1 due to broad-based disc bulging, ligamentum flavum hypertrophy, and  facet arthropathy. No evidence of lumbar spine fracture. Electronically Signed   By: Maurine Simmering   On: 05/04/2021 16:53   US Venous Img Lower Unilateral Left (DVT)  Result Date: 05/04/2021 CLINICAL DATA:  Left lower extremity swelling and pain. EXAM: Left LOWER EXTREMITY VENOUS DOPPLER ULTRASOUND TECHNIQUE: Gray-scale sonography with compression, as well as color and duplex ultrasound, were performed to evaluate the deep venous system(s) from the level of the common femoral vein through the popliteal and proximal calf veins. COMPARISON:  May 29, 2016. FINDINGS: VENOUS Normal compressibility of the common femoral, superficial femoral, and popliteal veins, as well as the visualized calf veins. Visualized portions of profunda femoral vein and great saphenous vein unremarkable. No filling defects to suggest DVT on grayscale or color Doppler imaging. Doppler waveforms show normal direction of venous flow, normal respiratory plasticity and response to augmentation. Limited views of the contralateral common femoral vein are unremarkable. OTHER None. Limitations: none IMPRESSION: Negative. Electronically Signed   By: Marijo Conception M.D.   On: 05/04/2021 17:22   DG Knee Complete 4 Views Right  Result Date: 05/04/2021 CLINICAL DATA:  Right knee pain. EXAM: RIGHT KNEE - COMPLETE 4+ VIEW COMPARISON:  None. FINDINGS: No evidence of fracture, dislocation, or joint effusion. Mild narrowing of lateral joint space is noted with osteophyte formation. Soft tissues are unremarkable. IMPRESSION:  Mild degenerative joint disease is noted laterally. No acute abnormality seen. Electronically Signed   By: Marijo Conception M.D.   On: 05/04/2021 16:49    Procedures Procedures   Medications Ordered in ED Medications  ibuprofen (ADVIL) tablet 800 mg (800 mg Oral Given 05/04/21 1649)    ED Course  I have reviewed the triage vital signs and the nursing notes.  Pertinent labs & imaging results that were available during my  care of the patient were reviewed by me and considered in my medical decision making (see chart for details).    MDM Rules/Calculators/A&P                          ALAZNE QUANT is a 75 y.o. female here presenting with left leg swelling as well as right knee pain and back pain.  I suspect radiculopathy.  Also consider DVT on the left leg and likely arthritis in the right knee.  Patient recently had blood work drawn for annual physical and her chemistry is normal.  Plan to get DVT study in the left leg and x-ray of the right knee and CT lumbar spine  5:39 PM DVT study is negative.  CT showed multiple areas of canal narrowing likely causing radiculopathy.  We will give a course of steroids and Robaxin.  Also told her to take NSAIDs.  She has follow-up with Dr. Erlinda Hong and she may benefit from physical therapy versus injections  Final Clinical Impression(s) / ED Diagnoses Final diagnoses:  None    Rx / DC Orders ED Discharge Orders     None        Drenda Freeze, MD 05/04/21 1740

## 2021-05-04 NOTE — ED Triage Notes (Signed)
Pt c/o bilat LE pain with left LE pain worse than right-denies injury-NAD-steady gait

## 2021-05-04 NOTE — ED Notes (Signed)
US tech at bedside

## 2021-05-04 NOTE — Discharge Instructions (Addendum)
You have a pinched nerve in your back.  Take Motrin for pain.  Take prednisone as prescribed  Take Robaxin for muscle spasm  See Dr. Roda Shutters for follow up   Return to ER if you have worse back pain, leg pain, trouble walking, weakness, numbness

## 2021-05-05 ENCOUNTER — Other Ambulatory Visit (HOSPITAL_BASED_OUTPATIENT_CLINIC_OR_DEPARTMENT_OTHER): Payer: Self-pay

## 2021-05-10 ENCOUNTER — Encounter: Payer: Self-pay | Admitting: Internal Medicine

## 2021-05-10 ENCOUNTER — Other Ambulatory Visit: Payer: Self-pay

## 2021-05-10 ENCOUNTER — Encounter: Payer: Self-pay | Admitting: Orthopaedic Surgery

## 2021-05-10 ENCOUNTER — Ambulatory Visit (INDEPENDENT_AMBULATORY_CARE_PROVIDER_SITE_OTHER): Payer: Medicare HMO | Admitting: Internal Medicine

## 2021-05-10 ENCOUNTER — Ambulatory Visit (INDEPENDENT_AMBULATORY_CARE_PROVIDER_SITE_OTHER): Payer: Worker's Compensation | Admitting: Orthopaedic Surgery

## 2021-05-10 VITALS — BP 138/74 | HR 61 | Ht 65.0 in | Wt 175.8 lb

## 2021-05-10 DIAGNOSIS — E1165 Type 2 diabetes mellitus with hyperglycemia: Secondary | ICD-10-CM | POA: Diagnosis not present

## 2021-05-10 DIAGNOSIS — M5416 Radiculopathy, lumbar region: Secondary | ICD-10-CM

## 2021-05-10 DIAGNOSIS — E118 Type 2 diabetes mellitus with unspecified complications: Secondary | ICD-10-CM | POA: Diagnosis not present

## 2021-05-10 DIAGNOSIS — IMO0002 Reserved for concepts with insufficient information to code with codable children: Secondary | ICD-10-CM

## 2021-05-10 LAB — POCT GLYCOSYLATED HEMOGLOBIN (HGB A1C): Hemoglobin A1C: 8.1 % — AB (ref 4.0–5.6)

## 2021-05-10 MED ORDER — TRAMADOL HCL 50 MG PO TABS: 50.0000 mg | ORAL_TABLET | Freq: Two times a day (BID) | ORAL | 2 refills | Status: DC | PRN

## 2021-05-10 MED ORDER — METHOCARBAMOL 500 MG PO TABS: 500.0000 mg | ORAL_TABLET | Freq: Two times a day (BID) | ORAL | 0 refills | Status: DC | PRN

## 2021-05-10 MED ORDER — EMPAGLIFLOZIN 25 MG PO TABS: 25.0000 mg | ORAL_TABLET | Freq: Every day | ORAL | 3 refills | Status: DC

## 2021-05-10 MED ORDER — DEXCOM G6 SENSOR MISC: 1.0000 | 3 refills | Status: DC

## 2021-05-10 MED ORDER — DEXCOM G6 TRANSMITTER MISC: 1.0000 | 3 refills | Status: DC

## 2021-05-10 MED ORDER — DEXCOM G6 RECEIVER DEVI: 1.0000 | 0 refills | Status: DC

## 2021-05-10 MED ORDER — NOVOLIN 70/30 FLEXPEN (70-30) 100 UNIT/ML ~~LOC~~ SUPN: PEN_INJECTOR | SUBCUTANEOUS | 3 refills | Status: DC

## 2021-05-10 NOTE — Progress Notes (Signed)
Name: [name]  Age/ Sex: 75 y.o., female   MRN/ DOB: [id number], 1945-10-13     PCP: Ann Held, DO   Reason for Endocrinology Evaluation: Type 2 Diabetes Mellitus  Initial Endocrine Consultative Visit: 01/17/2021    PATIENT IDENTIFIER: [name] is a 75 y.o. female with a past medical history of HTN, T2Dm, CAD , Osteoporosis and Hx of pancreatitis. The patient has followed with Endocrinology clinic since 4 f/09/2021 or consultative assistance with management of her diabetes.  DIABETIC HISTORY:  [name] was diagnosed with DM in 1998, has intolerance to Metformin due to Gi side effects. Her hemoglobin A1c has ranged from 9.3% in 2015, peaking at 10.1% in 2020.  She has history of Pancreatitis that she believes is caused Metformin   On her initial visit to our clinic she had an A1c of 9.4%  , she was on Jardiance,Glimepiride , Januvia and insulin mix . We stopped Glimepiride as she was not taking it, we stopped Januvia due to hx of pancreatitis. We continued jardiance and adjusted insulin mix    SUBJECTIVE:   During the last visit (01/17/2021): A1c 9.4% We adjusted insulin mix, conitnued Jardiance and stopped Januvia and Glimepiride   Today (05/10/2021): [name] is here for a follow up on diabetes management. She checks her blood sugars 2 times daily. The patient has not had hypoglycemic episodes since the last clinic visit.   Denies nausea or vomiting   HOME DIABETES REGIMEN:  Jardiance 25 mg, 1 tablet in the morning Novolin Mix to 45 units before Breakfast and 40 units Before Supper     Statin: yes ACE-I/ARB: yes  METER DOWNLOAD SUMMARY: Did not bring     DIABETIC COMPLICATIONS: Microvascular complications:   Denies: CKD, retinopathy, neuropathy Last Eye Exam: Completed 12/2020  Macrovascular complications:  CAD ( S/P stent placement)  Denies: CVA, PVD   HISTORY:  Past Medical History:  Past Medical History:  Diagnosis Date  .  Blockage of coronary artery of heart ([place])   . Depression   . Diabetes mellitus   . Hyperlipidemia   . Hypertension   . Pancreatitis   . Restless leg syndrome    Past Surgical History:  Past Surgical History:  Procedure Laterality Date  . CATARACT EXTRACTION, BILATERAL    . CORONARY ANGIOPLASTY WITH STENT PLACEMENT    . FOOT SURGERY Right    cyst removal  . ROTATOR CUFF REPAIR     right  . ROTATOR CUFF REPAIR Left   . TUBAL LIGATION     Social History:  reports that she quit smoking about a year ago. Her smoking use included cigarettes. She smoked an average of .2 packs per day. She has never used smokeless tobacco. She reports that she does not drink alcohol and does not use drugs. Family History:  Family History  Problem Relation Age of Onset  . Heart attack Father   . Heart attack Brother   . Diabetes Sister   . Cirrhosis Sister   . Hypertension Sister   . Cancer Mother   . Hypertension Mother   . Vision loss Mother   . Cancer Sister   . Sleep apnea Sister   . Heart Problems Sister   . Diabetes Sister   . Gout Sister   . Diabetes Son   . Heart attack Son   . Diabetes Daughter   . Goiter Daughter   . Diabetes Daughter      HOME MEDICATIONS: Allergies  as of 05/10/2021       Reactions   Metformin And Related Other (See Comments)   Sick on stomach, sweaty, throw up, pain   Levemir [insulin Detemir] Hives, Swelling   Clindamycin Nausea And Vomiting        Medication List        Accurate as of May 10, 2021 12:52 PM. If you have any questions, ask your nurse or doctor.          alendronate 70 MG tablet Commonly known as: FOSAMAX Take with a full glass of water on an empty stomach.   ALPRAZolam 0.25 MG tablet Commonly known as: XANAX Take 1 tablet by mouth three times a day if needed   amLODipine 10 MG tablet Commonly known as: NORVASC TAKE ONE TABLET BY MOUTH ONCE DAILY   aspirin EC 81 MG tablet Take 81 mg by mouth daily.   B-D SINGLE USE  SWABS REGULAR Pads USE AS DIRECTED   empagliflozin 25 MG Tabs tablet Commonly known as: Jardiance Take 1 tablet (25 mg total) by mouth daily before breakfast.   fenofibrate 160 MG tablet TAKE ONE TABLET BY MOUTH EVERY EVENING   ferrous sulfate 325 (65 FE) MG tablet Take 325 mg by mouth daily with breakfast.   Fish Oil 1000 MG Caps Take by mouth.   FLAXSEED OIL PO Take 1 capsule by mouth 2 (two) times daily.   FLUoxetine 40 MG capsule Commonly known as: PROZAC TAKE ONE CAPSULE BY MOUTH ONCE DAILY   fluticasone 50 MCG/ACT nasal spray Commonly known as: FLONASE SHAKE LIQUID AND USE 2 SPRAYS IN EACH NOSTRIL DAILY   HYDROcodone-acetaminophen 5-325 MG tablet Commonly known as: Norco Take 1-2 tablets by mouth daily as needed.   HYDROcodone-acetaminophen 5-325 MG tablet Commonly known as: Norco Take 1 tablet by mouth every 6 (six) hours as needed for moderate pain.   Insulin Pen Needle 32G X 4 MM Misc 1 Device by Does not apply route 2 (two) times daily.   Lancing Device Misc   levocetirizine 5 MG tablet Commonly known as: XYZAL TAKE 1 TABLET(5 MG) BY MOUTH EVERY EVENING   lisinopril 40 MG tablet Commonly known as: ZESTRIL Take 1 tablet (40 mg total) by mouth daily.   meloxicam 7.5 MG tablet Commonly known as: MOBIC Take 1 tablet (7.5 mg total) by mouth daily as needed for pain.   methocarbamol 500 MG tablet Commonly known as: Robaxin Take 1 tablet (500 mg total) by mouth 2 (two) times daily as needed. What changed:  when to take this reasons to take this Changed by: Eduard Roux, MD   metoprolol succinate 50 MG 24 hr tablet Commonly known as: TOPROL-XL TAKE ONE TABLET BY MOUTH ONCE DAILY WITH FOOD   multivitamin with minerals Tabs tablet Take 1 tablet by mouth daily.   NovoLIN 70/30 Kwikpen (70-30) 100 UNIT/ML KwikPen Generic drug: insulin isophane & regular human KwikPen Inject 45 Units into the skin daily before breakfast AND 40 Units daily before  supper.   predniSONE 20 MG tablet Commonly known as: DELTASONE Take 3 tablets by mouth daily for 2 days, then 2 tablets daily for 2 days, then 1 tablet daily for 2 days   rosuvastatin 20 MG tablet Commonly known as: CRESTOR TAKE ONE TABLET BY MOUTH EVERY EVENING   Tai Doc Control Normal Soln   THERATEARS OP Place 1 drop into both eyes daily as needed (dry eyes).   traMADol 50 MG tablet Commonly known as: ULTRAM Take 1 tablet (  50 mg total) by mouth 2 (two) times daily as needed. What changed: when to take this Changed by: Eduard Roux, MD   True Metrix Air Glucose Meter w/Device Kit 1 each by Does not apply route 4 (four) times daily. DX CODE E11.8   True Metrix Blood Glucose Test test strip Generic drug: glucose blood USE AS DIRECTED 4 TIMES A DAY   TRUEplus Lancets 33G Misc USE AS DIRECTED 4 TIMES A DAY   vitamin E 180 MG (400 UNITS) capsule Take 400 Units by mouth daily.         OBJECTIVE:   Vital Signs: There were no vitals taken for this visit.  Wt Readings from Last 3 Encounters:  05/04/21 175 lb (79.4 kg)  04/24/21 175 lb 6.4 oz (79.6 kg)  01/27/21 177 lb (80.3 kg)     Exam: General: Pt appears well and is in NAD  Lungs: Clear with good BS bilat with no rales, rhonchi, or wheezes  Heart: RRR with normal S1 and S2 and no gallops; no murmurs; no rub  Abdomen: Normoactive bowel sounds, soft, nontender, without masses or organomegaly palpable  Extremities: No pretibial edema.   Neuro: MS is good with appropriate affect, pt is alert and Ox3    DM foot exam: 01/17/2021   The skin of the feet is intact without sores or ulcerations. The pedal pulses are 2+ on right and 2+ on left. The sensation is intact to a screening 5.07, 10 gram monofilament bilaterally       DATA REVIEWED:  Lab Results  Component Value Date   HGBA1C 8.4 (H) 04/24/2021   HGBA1C 8.4 (H) 02/16/2021   HGBA1C 9.4 (H) 12/06/2020   Lab Results  Component Value Date   MICROALBUR  12.2 (H) 12/06/2020   LDLCALC 57 12/06/2020   CREATININE 0.94 04/24/2021   Lab Results  Component Value Date   MICRALBCREAT 13.3 12/06/2020     Lab Results  Component Value Date   CHOL 116 04/24/2021   HDL 28.70 (L) 04/24/2021   LDLCALC 57 12/06/2020   LDLDIRECT 53.0 04/24/2021   TRIG 271.0 (H) 04/24/2021   CHOLHDL 4 04/24/2021         ASSESSMENT / PLAN / RECOMMENDATIONS:   1) Type 2 Diabetes Mellitus, Sub-Optimally controlled, With macrovascular - complications - Most recent A1c of 8.1 %. Goal A1c < 7.0 %.    - A1c trending down, no glucose data so I am not sure where the hyperglycemia is. She does admits to forgetting to take insulin at times. For example forgot to take it last night - -I went over with the patient various strategies as to how she can be reminded to take the medications  - Will prescribe Dexcom and hopfeully this will help with glucose control  - GLP-1 agonists and DPP-4 inhibitors are contra-indicated due to hx of pancreatitis     MEDICATIONS: - Continue Jardiance 25 mg, 1 tablet in the morning  - Change Novolin Mix to 46 units before Breakfast and 40 units Before Supper   EDUCATION / INSTRUCTIONS: BG monitoring instructions: Patient is instructed to check her blood sugars 2 times a day, fasting and supper . Call [facility] if: BG persistently < 70  I reviewed the Rule of 15 for the treatment of hypoglycemia in detail with the patient. Literature supplied.   2) Diabetic complications:  Eye: Does not have known diabetic retinopathy.  Neuro/ Feet: Does not have known diabetic peripheral neuropathy .  Renal: Patient  does not have known baseline CKD. She   is  on an ACEI/ARB at present.      F/U in 4 months     Signed electronically by: Mack Guise, MD  [facility]  [facility] [address] [place], [place] [address] Phone: [phone] FAX: [phone]   CC: [address] [place] [address] Phone: [phone]  Fax: [phone]  Return to Endocrinology clinic as below: Future Appointments  Date Time Provider [place]  05/10/2021  1:20 PM Shamleffer, Melanie Crazier, MD [facility] None  05/16/2021  1:00 PM [facility] HEALTH COACH [facility]  06/06/2021  1:00 PM [facility] C[facility] PHARMACIST [facility]  07/25/2021 10:00 AM Lowne Koren Shiver, DO [facility]

## 2021-05-10 NOTE — Patient Instructions (Addendum)
-   Continue Jardiance 25 mg, 1 tablet in the morning  - Change Novolin Mix to 46 units before Breakfast and 40 units Before Supper       HOW TO TREAT LOW BLOOD SUGARS (Blood sugar LESS THAN 70 MG/DL) Please follow the RULE OF 15 for the treatment of hypoglycemia treatment (when your (blood sugars are less than 70 mg/dL)   STEP 1: Take 15 grams of carbohydrates when your blood sugar is low, which includes:  3-4 GLUCOSE TABS  OR 3-4 OZ OF JUICE OR REGULAR SODA OR ONE TUBE OF GLUCOSE GEL    STEP 2: RECHECK blood sugar in 15 MINUTES STEP 3: If your blood sugar is still low at the 15 minute recheck --> then, go back to STEP 1 and treat AGAIN with another 15 grams of carbohydrates.

## 2021-05-10 NOTE — Progress Notes (Signed)
Office Visit Note   Patient: [name]           Date of Birth: Dec 17, 1945           MRN: [id number] Visit Date: 05/10/2021              Requested by: [address], [place], [place] [address] [place],  [place] [address] PCP: [name], [name], DO   Assessment & Plan: Visit Diagnoses:  1. Radiculopathy, lumbar region     Plan: Impression is chronic left lower back pain and left lower extremity radiculopathy.  CT findings show mild spinal canal narrowing L3-4 and L4-5, bilateral neural foraminal narrowing at L3-4, L4-5 and L5-S1 due to a broad-based disc bulge, ligamentum flavum hypertrophy and facet arthropathy.  Today, we discussed referral to Dr. Alvester Morin for epidural steroid injection which I believe would be the next step.  She is concerned that this will increase her blood sugar and would rather have referral to neurosurgery.  I have made this referral to Dr. Conchita Paris.  She will follow-up with [name] as needed for her back.  Call with concerns or questions.  Of note, we are following her for right shoulder pain which is being filed under [employer]  We are currently waiting her A1c to drop below 8.0 in order to proceed with surgical intervention.  We have provided new work note today to continue current work restrictions until she is attained an A1c less than 8.0.  At that time she will call us to follow-up and further discuss surgery.  Total face to face encounter time was greater than 25 minutes and over half of this time was spent in counseling and/or coordination of care.  Follow-Up Instructions: Return if symptoms worsen or fail to improve.   Orders:  No orders of the defined types were placed in this encounter.  No orders of the defined types were placed in this encounter.     Procedures: No procedures performed   Clinical Data: No additional findings.   Subjective: Chief Complaint  Patient presents with   Lower Back - Pain   Right  Shoulder - Pain    HPI patient is a pleasant 75 year old female who comes in today with continued left lower back pain and left lower extremity radiculopathy.  She has been dealing with this for a while.  She has been in physical therapy and has been on medications without relief of symptoms.  She was seen in the ED on 05/04/2021 due to increased left lower extremity pain and swelling.  Venous Doppler ultrasound performed which was negative for DVT.  Subsequent CT of the lumbar spine ordered which showed mild spinal canal narrowing L3-4 and L4-5, bilateral neural foraminal narrowing at L3-4, L4-5 and L5-S1 due to a broad-based disc bulge, ligamentum flavum hypertrophy and facet arthropathy.     Objective: Vital Signs: There were no vitals taken for this visit.    Ortho Exam unchanged lumbar exam  Specialty Comments:  No specialty comments available.  Imaging: No new imaging   PMFS History: Patient Active Problem List   Diagnosis Date Noted   Diabetes mellitus (HCC) 01/17/2021   Type 2 diabetes mellitus with hyperglycemia, without long-term current use of insulin (HCC) 01/17/2021   Acute pain of right shoulder 08/18/2020   Primary osteoarthritis of right knee 08/18/2020   Impacted cerumen of right ear 05/24/2020   Bilateral impacted cerumen 05/17/2020   Hyperlipidemia associated with type 2 diabetes  mellitus (HCC) 03/10/2020   Cough 03/10/2020   Seasonal allergies 03/10/2020   Elevated rheumatoid factor 07/21/2018   Gastroenteritis 07/21/2018   Trigger middle finger of left hand 12/19/2017   Ganglion cyst of dorsum of right wrist 12/19/2017   Morton's neuroma of right foot 12/19/2017   Preventative health care 12/13/2017   Pain in both hands 12/13/2017   Hyperlipidemia LDL goal <100 12/13/2017   Abscess of upper gum 12/13/2017   Spinal stenosis of cervical region 09/25/2016   Cervical radiculopathy 09/06/2016   Dyspnea 02/18/2016   Chest pain 02/18/2016   HLD  (hyperlipidemia) 02/18/2016   CAD in native artery 02/18/2016   RLS (restless legs syndrome) 02/18/2016   Pancreatitis 02/18/2016   SOB (shortness of breath) 02/18/2016   Uncontrolled type 2 diabetes mellitus with complication (HCC)    Tobacco abuse    Osteoarthritis of right knee 07/05/2015   Smoking 07/02/2013   Acute upper respiratory infections of unspecified site 07/02/2013   Flu vaccine need 07/02/2013   Multinodular goiter 04/16/2013   Bunion 07/07/2012   BACK PAIN, THORACIC REGION, RIGHT 05/24/2009   IRON DEFICIENCY 03/25/2009   GERD 03/25/2009   WEIGHT LOSS 03/25/2009   SINUSITIS- ACUTE-NOS 11/25/2008   Hyperlipidemia LDL goal <70 10/07/2008   ACUTE PANCREATITIS 09/07/2008   OVARIAN CYST 09/07/2008   KNEE PAIN, RIGHT 05/24/2008   CARPAL TUNNEL SYNDROME, RIGHT 05/12/2008   Acute pain of both shoulders 04/02/2008   ROTATOR CUFF REPAIR, RIGHT, HX OF 03/29/2008   GOITER, MULTINODULAR 12/26/2007   Depression 07/07/2007   HIP PAIN, RIGHT 03/11/2007   RESTLESS LEG SYNDROME, HX OF 03/11/2007   Diabetes mellitus type II, uncontrolled (HCC) 03/07/2007   Essential hypertension 03/07/2007   Past Medical History:  Diagnosis Date   Blockage of coronary artery of heart (HCC)    Depression    Diabetes mellitus    Hyperlipidemia    Hypertension    Pancreatitis    Restless leg syndrome     Family History  Problem Relation Age of Onset   Heart attack Father    Heart attack Brother    Diabetes Sister    Cirrhosis Sister    Hypertension Sister    Cancer Mother    Hypertension Mother    Vision loss Mother    Cancer Sister    Sleep apnea Sister    Heart Problems Sister    Diabetes Sister    Gout Sister    Diabetes Son    Heart attack Son    Diabetes Daughter    Goiter Daughter    Diabetes Daughter     Past Surgical History:  Procedure Laterality Date   CATARACT EXTRACTION, BILATERAL     CORONARY ANGIOPLASTY WITH STENT PLACEMENT     FOOT SURGERY Right    cyst  removal   ROTATOR CUFF REPAIR     right   ROTATOR CUFF REPAIR Left    TUBAL LIGATION     Social History   Occupational History   Not on file  Tobacco Use   Smoking status: Former    Packs/day: 0.20    Types: Cigarettes    Quit date: 05/05/2020    Years since quitting: 1.0   Smokeless tobacco: Never   Tobacco comments:    quit in 2021  Vaping Use   Vaping Use: Never used  Substance and Sexual Activity   Alcohol use: No   Drug use: No   Sexual activity: Not on file

## 2021-05-16 ENCOUNTER — Ambulatory Visit: Payer: Medicare HMO

## 2021-05-16 NOTE — Progress Notes (Deleted)
Subjective:   [name] is a 75 y.o. female who presents for Medicare Annual (Subsequent) preventive examination.  I connected with *** today by telephone and verified that I am speaking with the correct person using two identifiers. Location patient: home Location provider: work Persons participating in the virtual visit: patient, Marine scientist.    I discussed the limitations, risks, security and privacy concerns of performing an evaluation and management service by telephone and the availability of in person appointments. I also discussed with the patient that there may be a patient responsible charge related to this service. The patient expressed understanding and verbally consented to this telephonic visit.    Interactive audio and video telecommunications were attempted between this provider and patient, however failed, due to patient having technical difficulties OR patient did not have access to video capability.  We continued and completed visit with audio only.  Some vital signs may be absent or patient reported.   Time Spent with patient on telephone encounter: *** minutes   Review of Systems    ***       Objective:    There were no vitals filed for this visit. There is no height or weight on file to calculate BMI.  Advanced Directives 05/04/2021 09/23/2020 05/12/2020 05/11/2019 04/22/2019 10/14/2017 04/01/2017  Does Patient Have a Medical Advance Directive? _0  No No  Would patient like information on creating a medical advance directive? - No - Patient declined No - Patient declined No - Patient declined - - Yes (MAU/Ambulatory/Procedural Areas - Information given)    Current Medications (verified) Outpatient Encounter Medications as of 05/16/2021  Medication Sig   Alcohol Swabs (B-D SINGLE USE SWABS REGULAR) PADS USE AS DIRECTED   alendronate (FOSAMAX) 70 MG tablet Take with a full glass of water on an empty stomach.   ALPRAZolam (XANAX) 0.25 MG tablet Take 1  tablet by mouth three times a day if needed   amLODipine (NORVASC) 10 MG tablet TAKE ONE TABLET BY MOUTH ONCE DAILY   aspirin EC 81 MG tablet Take 81 mg by mouth daily.   Blood Glucose Calibration (TAI DOC CONTROL) NORMAL SOLN    Blood Glucose Monitoring Suppl (TRUE METRIX AIR GLUCOSE METER) w/Device KIT 1 each by Does not apply route 4 (four) times daily. 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   Continuous Blood Gluc Receiver (DEXCOM G6 RECEIVER) DEVI 1 Device by Does not apply route as directed.   Continuous Blood Gluc Sensor (DEXCOM G6 SENSOR) MISC 1 Device by Does not apply route as directed.   Continuous Blood Gluc Transmit (DEXCOM G6 TRANSMITTER) MISC 1 Device by Does not apply route as directed.   empagliflozin (JARDIANCE) 25 MG TABS tablet Take 1 tablet (25 mg total) by mouth daily before breakfast.   fenofibrate 160 MG tablet TAKE ONE TABLET BY MOUTH EVERY EVENING   ferrous sulfate 325 (65 FE) MG tablet Take 325 mg by mouth daily with breakfast.    Flaxseed, Linseed, (FLAXSEED OIL PO) Take 1 capsule by mouth 2 (two) times daily.    FLUoxetine (PROZAC) 40 MG capsule TAKE ONE CAPSULE BY MOUTH ONCE DAILY   fluticasone (FLONASE) 50 MCG/ACT nasal spray SHAKE LIQUID AND USE 2 SPRAYS IN EACH NOSTRIL DAILY   glucose blood (TRUE METRIX BLOOD GLUCOSE TEST) test strip USE AS DIRECTED 4 TIMES A DAY   HYDROcodone-acetaminophen (NORCO) 5-325 MG tablet Take 1-2 tablets  by mouth daily as needed.   HYDROcodone-acetaminophen (NORCO) 5-325 MG tablet Take 1 tablet by mouth every 6 (six) hours as needed for moderate pain.   Insulin Pen Needle 32G X 4 MM MISC 1 Device by Does not apply route 2 (two) times daily.   Lancet Devices (LANCING DEVICE) MISC    levocetirizine (XYZAL) 5 MG tablet TAKE 1 TABLET(5 MG) BY MOUTH EVERY EVENING   lisinopril (ZESTRIL) 40 MG tablet Take 1 tablet (40 mg total) by mouth daily.   meloxicam (MOBIC)  7.5 MG tablet Take 1 tablet (7.5 mg total) by mouth daily as needed for pain.   methocarbamol (ROBAXIN) 500 MG tablet Take 1 tablet (500 mg total) by mouth 2 (two) times daily as needed.   metoprolol succinate (TOPROL-XL) 50 MG 24 hr tablet TAKE ONE TABLET BY MOUTH ONCE DAILY WITH FOOD   Multiple Vitamin (MULTIVITAMIN WITH MINERALS) TABS Take 1 tablet by mouth daily.   NOVOLIN 70/30 KWIKPEN (70-30) 100 UNIT/ML KwikPen Inject 46 Units into the skin daily before breakfast AND 40 Units daily before supper.   Omega-3 Fatty Acids (FISH OIL) 1000 MG CAPS Take by mouth.   predniSONE (DELTASONE) 20 MG tablet Take 3 tablets by mouth daily for 2 days, then 2 tablets daily for 2 days, then 1 tablet daily for 2 days   rosuvastatin (CRESTOR) 20 MG tablet TAKE ONE TABLET BY MOUTH EVERY EVENING   traMADol (ULTRAM) 50 MG tablet Take 1 tablet (50 mg total) by mouth 2 (two) times daily as needed.   TRUEplus Lancets 33G MISC USE AS DIRECTED 4 TIMES A DAY   vitamin E 400 UNIT capsule Take 400 Units by mouth daily.   No facility-administered encounter medications on file as of 05/16/2021.    Allergies (verified) Metformin and related, Levemir [insulin detemir], and Clindamycin   History: Past Medical History:  Diagnosis Date   Blockage of coronary artery of heart (HCC)    Depression    Diabetes mellitus    Hyperlipidemia    Hypertension    Pancreatitis    Restless leg syndrome    Past Surgical History:  Procedure Laterality Date   CATARACT EXTRACTION, BILATERAL     CORONARY ANGIOPLASTY WITH STENT PLACEMENT     FOOT SURGERY Right    cyst removal   ROTATOR CUFF REPAIR     right   ROTATOR CUFF REPAIR Left    TUBAL LIGATION     Family History  Problem Relation Age of Onset   Heart attack Father    Heart attack Brother    Diabetes Sister    Cirrhosis Sister    Hypertension Sister    Cancer Mother    Hypertension Mother    Vision loss Mother    Cancer Sister    Sleep apnea Sister    Heart  Problems Sister    Diabetes Sister    Gout Sister    Diabetes Son    Heart attack Son    Diabetes Daughter    Goiter Daughter    Diabetes Daughter    Social History   Socioeconomic History   Marital status: Married    Spouse name: Not on file   Number of children: Not on file   Years of education: Not on file   Highest education level: Not on file  Occupational History   Not on file  Tobacco Use   Smoking status: Former    Packs/day: 0.20    Types: Cigarettes    Quit date:  05/05/2020    Years since quitting: 1.0   Smokeless tobacco: Never   Tobacco comments:    quit in 2021  Vaping Use   Vaping Use: Never used  Substance and Sexual Activity   Alcohol use: No   Drug use: No   Sexual activity: Not on file  Other Topics Concern   Not on file  Social History Narrative   Exercise: seldom   Caffeine use: seldom   Social Determinants of Health   Financial Resource Strain: Medium Risk   Difficulty of Paying Living Expenses: Somewhat hard  Food Insecurity: Not on file  Transportation Needs: Not on file  Physical Activity: Inactive   Days of Exercise per Week: 0 days   Minutes of Exercise per Session: 0 min  Stress: Not on file  Social Connections: Not on file    Tobacco Counseling Counseling given: Not Answered Tobacco comments: quit in 2021   Clinical Intake:                 Diabetes:  Is the patient diabetic?  Yes  If diabetic, was a CBG obtained today?  No  Did the patient bring in their glucometer from home?  No phone visit How often do you monitor your CBG's? ***.   Financial Strains and Diabetes Management:  Are you having any financial strains with the device, your supplies or your medication? {YES/NO:21197}.  Does the patient want to be seen by Chronic Care Management for management of their diabetes?  {YES/NO:21197} Would the patient like to be referred to a Nutritionist or for Diabetic Management?  {YES/NO:21197}  Diabetic  Exams:  Diabetic Eye Exam: Completed 12/30/2020.   Diabetic Foot Exam: Pt has been advised about the importance in completing this exam. To be completed by PCP.           Activities of Daily Living No flowsheet data found.  Patient Care Team: Carollee Herter, Alferd Apa, DO as PCP - General Buford Dresser, MD as PCP - Cardiology (Cardiology) Ashok Pall, MD as Consulting Physician (Neurosurgery) Monna Fam, MD as Consulting Physician (Ophthalmology) Cherre Robins, PharmD (Pharmacist) [facility], Melanie Crazier, MD as Consulting Physician (Endocrinology)  Indicate any recent Medical Services you may have received from other than [facility] providers in the past year (date may be approximate).     Assessment:   This is a routine wellness examination for [place].  Hearing/Vision screen No results found.  Dietary issues and exercise activities discussed:     Goals Addressed   None    Depression Screen PHQ 2/9 Scores 10/14/2020 09/13/2020 06/07/2020 05/12/2020 05/11/2019 04/01/2017 10/11/2016  PHQ - 2 Score _0 0 0 0 0  PHQ- 9 Score _1 - - - -    Fall Risk Fall Risk  05/12/2020 05/11/2019 04/01/2017 10/11/2016 11/14/2015  Falls in the past year? 0 0 No No No  Number falls in past yr: 0 - - - -  Injury with Fall? 0 - - - -  Follow up Education provided;Falls prevention discussed - - - -    FALL RISK PREVENTION PERTAINING TO THE HOME:  Any stairs in or around the home? {YES/NO:21197} If so, are there any without handrails? {YES/NO:21197} Home free of loose throw rugs in walkways, pet beds, electrical cords, etc? {YES/NO:21197} Adequate lighting in your home to reduce risk of falls? {YES/NO:21197}  ASSISTIVE DEVICES UTILIZED TO PREVENT FALLS:  Life alert? {YES/NO:21197} Use of a cane, walker or w/c? {YES/NO:21197} Grab bars in the bathroom? {  YES/NO:21197} Shower chair or bench in shower? {YES/NO:21197} Elevated toilet seat or a handicapped toilet?  {YES/NO:21197}  TIMED UP AND GO:  Was the test performed? {YES/NO:21197}.  Length of time to ambulate 10 feet: *** sec.   {Appearance of Gait:2101803}  Cognitive Function: MMSE - Mini Mental State Exam 04/01/2017  Orientation to time 5  Orientation to Place 5  Registration 3  Attention/ Calculation 5  Recall 2  Language- name 2 objects 2  Language- repeat 1  Language- follow 3 step command 3  Language- read & follow direction 1  Write a sentence 1  Copy design 1  Total score 29     6CIT Screen 05/11/2019  What Year? 0 points  What month? 0 points  What time? 0 points  Count back from 20 0 points  Months in reverse 0 points  Repeat phrase 0 points  Total Score 0    Immunizations Immunization History  Administered Date(s) Administered   Fluad Quad(high Dose 65+) 07/28/2019   Influenza Split 08/08/2012   Influenza, High Dose Seasonal PF 08/09/2014, 07/05/2015, 07/21/2018   PFIZER(Purple Top)SARS-COV-2 Vaccination 12/07/2019, 01/04/2020   PPD Test 08/27/2011, 09/04/2011, 07/13/2014, 09/23/2015   Pneumococcal Conjugate-13 08/09/2014   Pneumococcal Polysaccharide-23 11/17/2013   Td 03/11/2007   Zoster, Live 03/11/2007    TDAP status: Due, Education has been provided regarding the importance of this vaccine. Advised may receive this vaccine at local pharmacy or Health Dept. Aware to provide a copy of the vaccination record if obtained from local pharmacy or Health Dept. Verbalized acceptance and understanding.  Flu Vaccine status: Up to date  Pneumococcal vaccine status: Up to date  {Covid-19 vaccine status:2101808}  Qualifies for Shingles Vaccine? Yes   Zostavax completed Yes   Shingrix Completed?: No.    Education has been provided regarding the importance of this vaccine. Patient has been advised to call insurance company to determine out of pocket expense if they have not yet received this vaccine. Advised may also receive vaccine at local pharmacy or Health Dept.  Verbalized acceptance and understanding.  Screening Tests Health Maintenance  Topic Date Due   Zoster Vaccines- Shingrix (1 of 2) Never done   COVID-19 Vaccine (3 - Pfizer risk series) 02/01/2020   Fecal DNA (Cologuard)  04/26/2020   FOOT EXAM  03/10/2021   INFLUENZA VACCINE  05/08/2021   TETANUS/TDAP  12/14/2023 (Originally 03/10/2017)   HEMOGLOBIN A1C  11/10/2021   OPHTHALMOLOGY EXAM  12/30/2021   DEXA SCAN  Completed   Hepatitis C Screening  Completed   PNA vac Low Risk Adult  Completed   HPV VACCINES  Aged Out    Health Maintenance  Health Maintenance Due  Topic Date Due   Zoster Vaccines- Shingrix (1 of 2) Never done   COVID-19 Vaccine (3 - Pfizer risk series) 02/01/2020   Fecal DNA (Cologuard)  04/26/2020   FOOT EXAM  03/10/2021   INFLUENZA VACCINE  05/08/2021    {Colorectal cancer screening:2101809}  {Mammogram status:21018020}  {Bone Density status:21018021}  Lung Cancer Screening: (Low Dose CT Chest recommended if Age 7-80 years, 30 pack-year currently smoking OR have quit w/in 15years.) does not qualify.     Additional Screening:  Hepatitis C Screening: Completed 10/21/2018  Vision Screening: Recommended annual ophthalmology exams for early detection of glaucoma and other disorders of the eye. Is the patient up to date with their annual eye exam?  {YES/NO:21197} Who is the provider or what is the name of the office in which the patient attends annual  eye exams? *** If pt is not established with a provider, would they like to be referred to a provider to establish care? {YES/NO:21197}.   Dental Screening: Recommended annual dental exams for proper oral hygiene  Community Resource Referral / Chronic Care Management: CRR required this visit?  {YES/NO:21197}  CCM required this visit?  {YES/NO:21197}     Plan:     I have personally reviewed and noted the following in the patient's chart:   Medical and social history Use of alcohol, tobacco or illicit  drugs  Current medications and supplements including opioid prescriptions.  Functional ability and status Nutritional status Physical activity Advanced directives List of other physicians Hospitalizations, surgeries, and ER visits in previous 12 months Vitals Screenings to include cognitive, depression, and falls Referrals and appointments  In addition, I have reviewed and discussed with patient certain preventive protocols, quality metrics, and best practice recommendations. A written personalized care plan for preventive services as well as general preventive health recommendations were provided to patient.   Due to this being a telephonic visit, the after visit summary with patients personalized plan was offered to patient via mail or my-chart. ***Patient declined at this time./ Patient would like to access on my-chart/ per request, patient was mailed a copy of AVS./ Patient preferred to pick up at office at next visit.   Marta Antu, LPN   4/0/8144  Nurse Health Advisor  Nurse Notes: ***

## 2021-05-17 ENCOUNTER — Telehealth: Payer: Self-pay | Admitting: *Deleted

## 2021-05-17 DIAGNOSIS — Z006 Encounter for examination for normal comparison and control in clinical research program: Secondary | ICD-10-CM

## 2021-05-17 NOTE — Telephone Encounter (Signed)
I called patient for 68-month Coordinate-Diabetes study. I was unable to leave message for mailbox was full.

## 2021-05-23 ENCOUNTER — Ambulatory Visit: Payer: Medicare HMO | Admitting: Internal Medicine

## 2021-05-25 DIAGNOSIS — I10 Essential (primary) hypertension: Secondary | ICD-10-CM | POA: Diagnosis not present

## 2021-05-25 DIAGNOSIS — M4802 Spinal stenosis, cervical region: Secondary | ICD-10-CM | POA: Diagnosis not present

## 2021-05-25 DIAGNOSIS — M4722 Other spondylosis with radiculopathy, cervical region: Secondary | ICD-10-CM | POA: Diagnosis not present

## 2021-05-25 DIAGNOSIS — M4316 Spondylolisthesis, lumbar region: Secondary | ICD-10-CM | POA: Diagnosis not present

## 2021-05-30 ENCOUNTER — Encounter: Payer: Self-pay | Admitting: *Deleted

## 2021-05-30 ENCOUNTER — Ambulatory Visit: Payer: Medicare HMO | Admitting: Internal Medicine

## 2021-05-30 DIAGNOSIS — M4316 Spondylolisthesis, lumbar region: Secondary | ICD-10-CM | POA: Diagnosis not present

## 2021-05-30 DIAGNOSIS — M4802 Spinal stenosis, cervical region: Secondary | ICD-10-CM | POA: Diagnosis not present

## 2021-05-30 DIAGNOSIS — M47816 Spondylosis without myelopathy or radiculopathy, lumbar region: Secondary | ICD-10-CM | POA: Diagnosis not present

## 2021-05-30 DIAGNOSIS — Z006 Encounter for examination for normal comparison and control in clinical research program: Secondary | ICD-10-CM

## 2021-05-30 DIAGNOSIS — M4722 Other spondylosis with radiculopathy, cervical region: Secondary | ICD-10-CM | POA: Diagnosis not present

## 2021-05-30 NOTE — Research (Signed)
I spoke to patient for 12 month Coordinate Diabetes Study. Patient is doing well with no adverse events or hospitalizations. Patient had no changes in medications . Hemoglobin A!C is 8.!%. I reminded to follow-up with endocrinologist on regular basis for better control of blood sugar. Patient stating following diabetic diet closely. Patient unable to exercise due to back issues. I thanked patient for participating in the Coordinate Diabetes Study.since this visit is her last visit for the study.

## 2021-06-02 ENCOUNTER — Telehealth: Payer: Self-pay | Admitting: Family Medicine

## 2021-06-02 ENCOUNTER — Telehealth: Payer: Self-pay

## 2021-06-02 ENCOUNTER — Ambulatory Visit: Payer: Medicare HMO

## 2021-06-02 ENCOUNTER — Ambulatory Visit (INDEPENDENT_AMBULATORY_CARE_PROVIDER_SITE_OTHER): Payer: Medicare HMO | Admitting: Pharmacist

## 2021-06-02 DIAGNOSIS — I10 Essential (primary) hypertension: Secondary | ICD-10-CM

## 2021-06-02 DIAGNOSIS — E1169 Type 2 diabetes mellitus with other specified complication: Secondary | ICD-10-CM | POA: Diagnosis not present

## 2021-06-02 DIAGNOSIS — E1165 Type 2 diabetes mellitus with hyperglycemia: Secondary | ICD-10-CM | POA: Diagnosis not present

## 2021-06-02 DIAGNOSIS — E785 Hyperlipidemia, unspecified: Secondary | ICD-10-CM | POA: Diagnosis not present

## 2021-06-02 NOTE — Progress Notes (Signed)
    Chronic Care Management Pharmacy Assistant   Name: PEARLY BARTOSIK  MRN: [id number] DOB: Nov 04, 1945   Reason for Encounter: [place] [employer] patient assistance application . Patient states that she has received her Jardiance but had to pay out of pocket for it. She explains it is too expensive and would like to do PAP for Jardiance.

## 2021-06-02 NOTE — Telephone Encounter (Signed)
Attempted to reach patient x 4 for her 9:00 phone visit for her Medicare wellness exam. No answer. Unable to leave a message.

## 2021-06-02 NOTE — Telephone Encounter (Signed)
Patient assistance application for Jardiance 25mg  was faxed 04/28/2021.  Contacted PAP today - see phone visit for CCM for more details.

## 2021-06-02 NOTE — Patient Instructions (Signed)
Visit Information  PATIENT GOALS:  Goals Addressed             This Visit's Progress    Chronic Care Management Pharmacy Care Plan   On track    CARE PLAN ENTRY (see longitudinal plan of care for additional care plan information)  Current Barriers:  Chronic Disease Management support, education, and care coordination needs related to Hypertension, Hyperlipidemia/Hx of CAD, Diabetes, Tobacco Use Disorder, Depression/Anxiety, Osteoporosis, Neuropathy   Hypertension BP Readings from Last 3 Encounters:  05/10/21 138/74  05/04/21 (!) 167/61  04/24/21 130/62  Pharmacist Clinical Goal(s): Over the next 90 days, patient will work with PharmD and providers to maintain BP goal <130/80 Current regimen:  Amlodipine 10mg  daily Lisinopril 40mg  daily Metoprolol Succinate 50mg  daily Interventions: Requested patient to check her blood pressure 2 times per week and record Patient self care activities - Over the next 90 days, patient will: Check blood pressure 2 times per week, document, and provide at future appointments Ensure daily salt intake < 2300 mg/day Continue current regimen for blood pressure  Hyperlipidemia / Heart Disease Lab Results  Component Value Date/Time   LDLCALC 57 12/06/2020 04:03 PM   LDLCALC 61 06/15/2020 08:19 AM   LDLDIRECT 53.0 04/24/2021 03:42 PM  Pharmacist Clinical Goal(s): Over the next 90 days, patient will work with PharmD and providers to maintain LDL goal < 70 Current regimen:  Aspirin 81mg  daily Fenofibrate 160mg  daily Omega 3 fish Oil 1030mg  twice daily Rosuvastatin 20mg  daily Interventions: Reviewed last lipid panel with patient. Has maintained LDL at goal and Tg improved form 344 in 2021 to 145 on 12/06/2020 Discussed that patient should be taking rosuvastatin vs atorvastatin (This was changed June 2021) Patient self care activities - Over the next 90 days, patient will: Continue current regimen for heart health and cholesterol Continue to follow  low fat diet  Diabetes Lab Results  Component Value Date/Time   HGBA1C 8.1 (A) 05/10/2021 01:36 PM   HGBA1C 8.4 (H) 04/24/2021 03:42 PM   HGBA1C 8.4 (H) 02/16/2021 09:37 AM  Pharmacist Clinical Goal(s): Over the next 180 days, patient will work with PharmD and providers to achieve A1c goal <7% Current regimen:  Humulin 70/30 - 47 units with breakfast and 40 units with evening meal Jardiance 25mg  - take 1 tablet each morning Interventions: Discussed DM goals Fasting (before meal blood sugar): 80-130 Post-Prandial (2 hrs after eating blood sugar): less than 180 A1c (3 month blood sugar average): less than 7% Requested patient to use DexCom to monitor blood glucose Coordinating with patient and [employer] program to complete application for Jardiance patient assistance Assisted patient in applying for Medicare Extra assistance. If denied will need to submit letter to [facility] program.  Collaborate with [facility] pharmacy to find solution to having Jardiance included in medication packaging again to improved adherence.  Patient self care activities - Over the next 90 days, patient will: Check blood sugar with DexCom CGM  Contact provider with any episodes of hypoglycemia Completed Medicare Extra Assistance application on line today. You should receive a letter of approval or denial in 3 weeks. Please call me as soon as you get this letter 02/05/2021 July 2021 [phone])  Depression / anxiety Pharmacist Clinical Goal(s) Over the next 90 days, patient will work with PharmD and providers to reduce symptoms associated with depression Current regimen:  Alprazolam 0.25mg  three times daily as needed  Fluoxetine 40mg  daily Interventions: Collaboration with provider regarding medication management (refills needed for alprazolam) - message sent  to request refill. Patient self care activities - Over the next 90 days, patient will: Maintain current regimen for depression and  anxiety  Osteoporosis Pharmacist Clinical Goal(s) Over the next 180 days, patient will work with PharmD and providers to reduce risk of fracture due to osteoporosis  Current regimen:  Alendronate 70mg  weekly Interventions: Consider repeat DEXA Scan Patient self care activities - Over the next 180 days, patient will: Consider repeat DEXA Scan  Shoulder Pain:  Pharmacist Clinical Goal(s) Over the next 180 days, patient will work with PharmD and providers to improve pain and improve BG control to A1c <8.0 so patient can be scheduled for needed shoulder surgery Current regimen:  Tramadol 50mg  up to 3 times a day if needed for pain Interventions: Discuss importance of taking diabetes meds daily and getting A1c <8.0 so surgery can be scheduled.  Updated medication list to include tramadol and also removed methocarbamol which patient is not longer taking Patient self care activities - Over the next 180 days, patient will: Continue to follow up with orthopedic office for pain management   Medication management Pharmacist Clinical Goal(s): Over the next 90 days, patient will work with PharmD and providers to maintain optimal medication adherence Current pharmacy: [facility] switched to [facility] Interventions Comprehensive medication review performed. Utilize [facility] pharmacy for medication synchronization, packaging and delivery Patient self care activities - Over the next 90 days, patient will: Focus on medication adherence by filling and taking medications appropriately  Take medications as prescribed Report any questions or concerns to PharmD and/or provider(s)  Please see past updates related to this goal by clicking on the "Past Updates" button in the selected goal          The patient verbalized understanding of instructions, educational materials, and care plan provided today and declined offer to receive copy of patient instructions, educational materials, and care plan.    Telephone follow up appointment with care management team member scheduled for: 3 weeks  , PharmD Clinical Pharmacist [facility]

## 2021-06-02 NOTE — Telephone Encounter (Signed)
Patient states both her FLUoxetine (PROZAC) 40 MG capsule and ALPRAZolam (XANAX) 0.25 MG tablet . are not working and would like to increase their dosage.  Please advise.

## 2021-06-02 NOTE — Progress Notes (Deleted)
Subjective:   [name] is a 75 y.o. female who presents for Medicare Annual (Subsequent) preventive examination.  I connected with Luana today by telephone and verified that I am speaking with the correct person using two identifiers. Location patient: home Location provider: work Persons participating in the virtual visit: patient, Marine scientist.    I discussed the limitations, risks, security and privacy concerns of performing an evaluation and management service by telephone and the availability of in person appointments. I also discussed with the patient that there may be a patient responsible charge related to this service. The patient expressed understanding and verbally consented to this telephonic visit.    Interactive audio and video telecommunications were attempted between this provider and patient, however failed, due to patient having technical difficulties OR patient did not have access to video capability.  We continued and completed visit with audio only.  Some vital signs may be absent or patient reported.   Time Spent with patient on telephone encounter: *** minutes   Review of Systems    ***       Objective:    There were no vitals filed for this visit. There is no height or weight on file to calculate BMI.  Advanced Directives 05/04/2021 09/23/2020 05/12/2020 05/11/2019 04/22/2019 10/14/2017 04/01/2017  Does Patient Have a Medical Advance Directive? _0  No No  Would patient like information on creating a medical advance directive? - No - Patient declined No - Patient declined No - Patient declined - - Yes (MAU/Ambulatory/Procedural Areas - Information given)    Current Medications (verified) Outpatient Encounter Medications as of 06/02/2021  Medication Sig   Alcohol Swabs (B-D SINGLE USE SWABS REGULAR) PADS USE AS DIRECTED   alendronate (FOSAMAX) 70 MG tablet Take with a full glass of water on an empty stomach.   ALPRAZolam (XANAX) 0.25 MG tablet Take  1 tablet by mouth three times a day if needed   amLODipine (NORVASC) 10 MG tablet TAKE ONE TABLET BY MOUTH ONCE DAILY   aspirin EC 81 MG tablet Take 81 mg by mouth daily.   Blood Glucose Calibration (TAI DOC CONTROL) NORMAL SOLN    Blood Glucose Monitoring Suppl (TRUE METRIX AIR GLUCOSE METER) w/Device KIT 1 each by Does not apply route 4 (four) times daily. 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   Continuous Blood Gluc Receiver (DEXCOM G6 RECEIVER) DEVI 1 Device by Does not apply route as directed.   Continuous Blood Gluc Sensor (DEXCOM G6 SENSOR) MISC 1 Device by Does not apply route as directed.   Continuous Blood Gluc Transmit (DEXCOM G6 TRANSMITTER) MISC 1 Device by Does not apply route as directed.   empagliflozin (JARDIANCE) 25 MG TABS tablet Take 1 tablet (25 mg total) by mouth daily before breakfast.   fenofibrate 160 MG tablet TAKE ONE TABLET BY MOUTH EVERY EVENING   ferrous sulfate 325 (65 FE) MG tablet Take 325 mg by mouth daily with breakfast.    Flaxseed, Linseed, (FLAXSEED OIL PO) Take 1 capsule by mouth 2 (two) times daily.    FLUoxetine (PROZAC) 40 MG capsule TAKE ONE CAPSULE BY MOUTH ONCE DAILY   fluticasone (FLONASE) 50 MCG/ACT nasal spray SHAKE LIQUID AND USE 2 SPRAYS IN EACH NOSTRIL DAILY   glucose blood (TRUE METRIX BLOOD GLUCOSE TEST) test strip USE AS DIRECTED 4 TIMES A DAY   HYDROcodone-acetaminophen (NORCO) 5-325 MG tablet Take 1-2 tablets  by mouth daily as needed.   HYDROcodone-acetaminophen (NORCO) 5-325 MG tablet Take 1 tablet by mouth every 6 (six) hours as needed for moderate pain.   Insulin Pen Needle 32G X 4 MM MISC 1 Device by Does not apply route 2 (two) times daily.   Lancet Devices (LANCING DEVICE) MISC    levocetirizine (XYZAL) 5 MG tablet TAKE 1 TABLET(5 MG) BY MOUTH EVERY EVENING   lisinopril (ZESTRIL) 40 MG tablet Take 1 tablet (40 mg total) by mouth daily.   meloxicam (MOBIC)  7.5 MG tablet Take 1 tablet (7.5 mg total) by mouth daily as needed for pain.   methocarbamol (ROBAXIN) 500 MG tablet Take 1 tablet (500 mg total) by mouth 2 (two) times daily as needed.   metoprolol succinate (TOPROL-XL) 50 MG 24 hr tablet TAKE ONE TABLET BY MOUTH ONCE DAILY WITH FOOD   Multiple Vitamin (MULTIVITAMIN WITH MINERALS) TABS Take 1 tablet by mouth daily.   NOVOLIN 70/30 KWIKPEN (70-30) 100 UNIT/ML KwikPen Inject 46 Units into the skin daily before breakfast AND 40 Units daily before supper.   Omega-3 Fatty Acids (FISH OIL) 1000 MG CAPS Take by mouth.   predniSONE (DELTASONE) 20 MG tablet Take 3 tablets by mouth daily for 2 days, then 2 tablets daily for 2 days, then 1 tablet daily for 2 days   rosuvastatin (CRESTOR) 20 MG tablet TAKE ONE TABLET BY MOUTH EVERY EVENING   traMADol (ULTRAM) 50 MG tablet Take 1 tablet (50 mg total) by mouth 2 (two) times daily as needed.   TRUEplus Lancets 33G MISC USE AS DIRECTED 4 TIMES A DAY   vitamin E 400 UNIT capsule Take 400 Units by mouth daily.   No facility-administered encounter medications on file as of 06/02/2021.    Allergies (verified) Metformin and related, Levemir [insulin detemir], and Clindamycin   History: Past Medical History:  Diagnosis Date   Blockage of coronary artery of heart (HCC)    Depression    Diabetes mellitus    Hyperlipidemia    Hypertension    Pancreatitis    Restless leg syndrome    Past Surgical History:  Procedure Laterality Date   CATARACT EXTRACTION, BILATERAL     CORONARY ANGIOPLASTY WITH STENT PLACEMENT     FOOT SURGERY Right    cyst removal   ROTATOR CUFF REPAIR     right   ROTATOR CUFF REPAIR Left    TUBAL LIGATION     Family History  Problem Relation Age of Onset   Heart attack Father    Heart attack Brother    Diabetes Sister    Cirrhosis Sister    Hypertension Sister    Cancer Mother    Hypertension Mother    Vision loss Mother    Cancer Sister    Sleep apnea Sister    Heart  Problems Sister    Diabetes Sister    Gout Sister    Diabetes Son    Heart attack Son    Diabetes Daughter    Goiter Daughter    Diabetes Daughter    Social History   Socioeconomic History   Marital status: Married    Spouse name: Not on file   Number of children: Not on file   Years of education: Not on file   Highest education level: Not on file  Occupational History   Not on file  Tobacco Use   Smoking status: Former    Packs/day: 0.20    Types: Cigarettes    Quit date:  05/05/2020    Years since quitting: 1.0   Smokeless tobacco: Never   Tobacco comments:    quit in 2021  Vaping Use   Vaping Use: Never used  Substance and Sexual Activity   Alcohol use: No   Drug use: No   Sexual activity: Not on file  Other Topics Concern   Not on file  Social History Narrative   Exercise: seldom   Caffeine use: seldom   Social Determinants of Health   Financial Resource Strain: Medium Risk   Difficulty of Paying Living Expenses: Somewhat hard  Food Insecurity: Not on file  Transportation Needs: Not on file  Physical Activity: Inactive   Days of Exercise per Week: 0 days   Minutes of Exercise per Session: 0 min  Stress: Not on file  Social Connections: Not on file    Tobacco Counseling Counseling given: Not Answered Tobacco comments: quit in 2021   Clinical Intake:                 Diabetes:  Is the patient diabetic?  Yes  If diabetic, was a CBG obtained today?  No  Did the patient bring in their glucometer from home?  No phone visit How often do you monitor your CBG's? ***.   Financial Strains and Diabetes Management:  Are you having any financial strains with the device, your supplies or your medication? {YES/NO:21197}.  Does the patient want to be seen by Chronic Care Management for management of their diabetes?  {YES/NO:21197} Would the patient like to be referred to a Nutritionist or for Diabetic Management?  {YES/NO:21197}  Diabetic  Exams:  Diabetic Eye Exam: Completed 12/30/2020.   Diabetic Foot Exam: Pt has been advised about the importance in completing this exam. To be completed by PCP          Activities of Daily Living No flowsheet data found.  Patient Care Team: Carollee Herter, Alferd Apa, DO as PCP - General Buford Dresser, MD as PCP - Cardiology (Cardiology) Ashok Pall, MD as Consulting Physician (Neurosurgery) Monna Fam, MD as Consulting Physician (Ophthalmology) Cherre Robins, PharmD (Pharmacist) [facility], Melanie Crazier, MD as Consulting Physician (Endocrinology)  Indicate any recent Medical Services you may have received from other than [facility] providers in the past year (date may be approximate).     Assessment:   This is a routine wellness examination for [place].  Hearing/Vision screen No results found.  Dietary issues and exercise activities discussed:     Goals Addressed   None    Depression Screen PHQ 2/9 Scores 10/14/2020 09/13/2020 06/07/2020 05/12/2020 05/11/2019 04/01/2017 10/11/2016  PHQ - 2 Score _0 0 0 0 0  PHQ- 9 Score _1 - - - -    Fall Risk Fall Risk  05/12/2020 05/11/2019 04/01/2017 10/11/2016 11/14/2015  Falls in the past year? 0 0 No No No  Number falls in past yr: 0 - - - -  Injury with Fall? 0 - - - -  Follow up Education provided;Falls prevention discussed - - - -    FALL RISK PREVENTION PERTAINING TO THE HOME:  Any stairs in or around the home? {YES/NO:21197} If so, are there any without handrails? {YES/NO:21197} Home free of loose throw rugs in walkways, pet beds, electrical cords, etc? {YES/NO:21197} Adequate lighting in your home to reduce risk of falls? {YES/NO:21197}  ASSISTIVE DEVICES UTILIZED TO PREVENT FALLS:  Life alert? {YES/NO:21197} Use of a cane, walker or w/c? {YES/NO:21197} Grab bars in the bathroom? {YES/NO:21197}  Shower chair or bench in shower? {YES/NO:21197} Elevated toilet seat or a handicapped toilet? {YES/NO:21197}  TIMED  UP AND GO:  Was the test performed? No .    Cognitive Function: MMSE - Mini Mental State Exam 04/01/2017  Orientation to time 5  Orientation to Place 5  Registration 3  Attention/ Calculation 5  Recall 2  Language- name 2 objects 2  Language- repeat 1  Language- follow 3 step command 3  Language- read & follow direction 1  Write a sentence 1  Copy design 1  Total score 29     6CIT Screen 05/11/2019  What Year? 0 points  What month? 0 points  What time? 0 points  Count back from 20 0 points  Months in reverse 0 points  Repeat phrase 0 points  Total Score 0    Immunizations Immunization History  Administered Date(s) Administered   Fluad Quad(high Dose 65+) 07/28/2019   Influenza Split 08/08/2012   Influenza, High Dose Seasonal PF 08/09/2014, 07/05/2015, 07/21/2018   PFIZER(Purple Top)SARS-COV-2 Vaccination 12/07/2019, 01/04/2020   PPD Test 08/27/2011, 09/04/2011, 07/13/2014, 09/23/2015   Pneumococcal Conjugate-13 08/09/2014   Pneumococcal Polysaccharide-23 11/17/2013   Td 03/11/2007   Zoster, Live 03/11/2007    {TDAP status:2101805}  {Flu Vaccine status:2101806}  {Pneumococcal vaccine status:2101807}  {Covid-19 vaccine status:2101808}  Qualifies for Shingles Vaccine? {YES/NO:21197}  Zostavax completed {YES/NO:21197}  {Shingrix Completed?:2101804}  Screening Tests Health Maintenance  Topic Date Due   Zoster Vaccines- Shingrix (1 of 2) Never done   COVID-19 Vaccine (3 - Pfizer risk series) 02/01/2020   Fecal DNA (Cologuard)  04/26/2020   FOOT EXAM  03/10/2021   INFLUENZA VACCINE  05/08/2021   TETANUS/TDAP  12/14/2023 (Originally 03/10/2017)   HEMOGLOBIN A1C  11/10/2021   OPHTHALMOLOGY EXAM  12/30/2021   DEXA SCAN  Completed   Hepatitis C Screening  Completed   PNA vac Low Risk Adult  Completed   HPV VACCINES  Aged Out    Health Maintenance  Health Maintenance Due  Topic Date Due   Zoster Vaccines- Shingrix (1 of 2) Never done   COVID-19 Vaccine (3  - Pfizer risk series) 02/01/2020   Fecal DNA (Cologuard)  04/26/2020   FOOT EXAM  03/10/2021   INFLUENZA VACCINE  05/08/2021    {Colorectal cancer screening:2101809}  {Mammogram status:21018020}  {Bone Density status:21018021}  Lung Cancer Screening: (Low Dose CT Chest recommended if Age 56-80 years, 30 pack-year currently smoking OR have quit w/in 15years.) does not qualify.    Additional Screening:  Hepatitis C Screening: Completed 10/21/2018  Vision Screening: Recommended annual ophthalmology exams for early detection of glaucoma and other disorders of the eye. Is the patient up to date with their annual eye exam?  {YES/NO:21197} Who is the provider or what is the name of the office in which the patient attends annual eye exams? *** If pt is not established with a provider, would they like to be referred to a provider to establish care? {YES/NO:21197}.   Dental Screening: Recommended annual dental exams for proper oral hygiene  Community Resource Referral / Chronic Care Management: CRR required this visit?  {YES/NO:21197}  CCM required this visit?  {YES/NO:21197}     Plan:     I have personally reviewed and noted the following in the patient's chart:   Medical and social history Use of alcohol, tobacco or illicit drugs  Current medications and supplements including opioid prescriptions.  Functional ability and status Nutritional status Physical activity Advanced directives List of other physicians Hospitalizations, surgeries,  and ER visits in previous 12 months Vitals Screenings to include cognitive, depression, and falls Referrals and appointments  In addition, I have reviewed and discussed with patient certain preventive protocols, quality metrics, and best practice recommendations. A written personalized care plan for preventive services as well as general preventive health recommendations were provided to patient.   Due to this being a telephonic visit, the  after visit summary with patients personalized plan was offered to patient via mail or my-chart. ***Patient declined at this time./ Patient would like to access on my-chart/ per request, patient was mailed a copy of AVS./ Patient preferred to pick up at office at next visit.   Marta Antu, LPN   6/76/1950  Nurse Health Advisor  Nurse Notes: ***

## 2021-06-02 NOTE — Chronic Care Management (AMB) (Signed)
Chronic Care Management Pharmacy Note  06/02/2021 Name:  [name] MRN:  [id number] DOB:  1945/12/30  Subjective: [name] is an 75 y.o. year old female who is a primary patient of Ann Held, DO.  The CCM team was consulted for assistance with disease management and care coordination needs.    Engaged with patient by telephone for  follow up on patient assistance for Jardiance and to assist with applying for Extra Assistance / LIS for Medicare  in response to provider referral for pharmacy case management and/or care coordination services.   Consent to Services:  The patient was given information about Chronic Care Management services, agreed to services, and gave verbal consent prior to initiation of services.  Please see initial visit note for detailed documentation.   Patient Care Team: Carollee Herter, Alferd Apa, DO as PCP - General Buford Dresser, MD as PCP - Cardiology (Cardiology) Ashok Pall, MD as Consulting Physician (Neurosurgery) Monna Fam, MD as Consulting Physician (Ophthalmology) Cherre Robins, PharmD (Pharmacist) [facility], Melanie Crazier, MD as Consulting Physician (Endocrinology)  Recent office visits: 04/24/2021 - PCP (Dr Carollee Herter) surgical clearance.Waiting for A1c < 8.0 to get shoulder surgery. Labs checked. A1c was 8.4%   12/29/2020 - PCP (Dr Etter Sjogren) f/u DM; Not controlled. Referred to endo. Increased Jardiance to 31m daily.  Recent consult visits: 05/10/2021 - Endo (Dr SKelton Pillar F/U DM with h/o pancreatitis. A1c trending down - was 8.1%. Increaesd Novolin Mix to 46 units before breakfast and 40 units before evening meal. Prescribed DexCom  05/10/2021 - Ortho Surgery (Dr XErlinda Hong F/U back pain. Recommended epidural steroid but patient concerned about increasing BG. Instead referral place for neurosurgery.   01/27/2021 - Ortho (Dr XErlinda Hong: acute low back pain. Stated prednisone 568mdose pack and methocarbamol 50036mid  prn.  01/17/2021- Endo (Dr ShaKelton Pillarnitial endocrinology visit. H/o pancreatitis. Januvia stopped. Due to history of pancreatitis, GLP-1 RA and DPP-4 inhibitors are contra-indicated. Stopped glimepiride due to increased risk of hypoglycemia with insulin. Continue Jardiance 25 mg, 1 tablet in the morning. Change Novolin Mix to 45 units before Breakfast and 40 units Before Supper. Patient is instructed to check blood sugars 2 times a day, before breakfast and.  Hospital visits: 05/04/2021 - ED Visit for lumbar radiculopathy. Prescribed predinsone 71m24mper for 6 days and robaxin 500mg10mry 8 hours. Also told to take OTC NSAIDs prn. Objective:  Lab Results  Component Value Date   CREATININE 0.94 04/24/2021   CREATININE 0.96 12/06/2020   CREATININE 0.82 09/23/2020    Lab Results  Component Value Date   HGBA1C 8.1 (A) 05/10/2021   Last diabetic Eye exam:  Lab Results  Component Value Date/Time   HMDIABEYEEXA Retinopathy (A) 12/30/2020 12:00 AM    Last diabetic Foot exam: No results found for: HMDIABFOOTEX  - Done 12/29/2020 by Dr ShamlKelton Pillar Component Value Date/Time   CHOL 116 04/24/2021 1542   TRIG 271.0 (H) 04/24/2021 1542   HDL 28.70 (L) 04/24/2021 1542   CHOLHDL 4 04/24/2021 1542   VLDL 54.2 (H) 04/24/2021 1542   LDLCALC 57 12/06/2020 1603   LDLCALC 61 06/15/2020 0819   LDLDIRECT 53.0 04/24/2021 1542    Hepatic Function Latest Ref Rng & Units 04/24/2021 12/06/2020 09/23/2020  Total Protein 6.0 - 8.3 g/dL 6.7 7.2 7.5  Albumin 3.5 - 5.2 g/dL 4.4 4.4 4.5  AST 0 - 37 U/L '21 16 21  ' ALT 0 - 35 U/L '20 16 22  ' Alk Phosphatase  39 - 117 U/L 35(L) 38(L) 43  Total Bilirubin 0.2 - 1.2 mg/dL 0.4 0.5 0.6  Bilirubin, Direct 0.0 - 0.3 mg/dL - - -    Lab Results  Component Value Date/Time   TSH 0.58 08/11/2018 02:34 PM   TSH 0.57 07/16/2018 09:50 AM   FREET4 0.74 08/11/2018 02:34 PM   FREET4 0.84 04/16/2013 11:19 AM    CBC Latest Ref Rng & Units 12/06/2020 09/23/2020  08/11/2018  WBC 4.0 - 10.5 K/uL 7.0 13.9(H) 11.2(H)  Hemoglobin 12.0 - 15.0 g/dL 12.2 13.5 12.3  Hematocrit 36.0 - 46.0 % 36.6 40.1 36.9  Platelets 150.0 - 400.0 K/uL 182.0 215 221.0    No results found for: VD25OH  Clinical ASCVD: Yes  The ASCVD Risk score Mikey Bussing DC Jr., et al., 2013) failed to calculate for the following reasons:   The valid total cholesterol range is 130 to 320 mg/dL    Other: DEXA 05/29/2017 Forearm Radius 33% T-score = -2.5.  DualFemur Neck Left  T-Score = -2.0; Which was a statistically significant increase in BMD of the left hip since prior exam dated 12/17/2013.  Social History   Tobacco Use  Smoking Status Former   Packs/day: 0.20   Types: Cigarettes   Quit date: 05/05/2020   Years since quitting: 1.0  Smokeless Tobacco Never  Tobacco Comments   quit in 2021   BP Readings from Last 3 Encounters:  05/10/21 138/74  05/04/21 (!) 167/61  04/24/21 130/62   Pulse Readings from Last 3 Encounters:  05/10/21 61  05/04/21 (!) 59  04/24/21 62   Wt Readings from Last 3 Encounters:  05/10/21 175 lb 12.8 oz (79.7 kg)  05/04/21 175 lb (79.4 kg)  04/24/21 175 lb 6.4 oz (79.6 kg)    Assessment: Review of patient past medical history, allergies, medications, health status, including review of consultants reports, laboratory and other test data, was performed as part of comprehensive evaluation and provision of chronic care management services.   SDOH:  (Social Determinants of Health) assessments and interventions performed:  SDOH Interventions    Flowsheet Row Most Recent Value  SDOH Interventions   Financial Strain Interventions Other (Comment)  [Applied for LIS today,  If denied will resubmit patient assistance for [place]  Allergies  Allergen Reactions   Metformin And Related Other (See Comments)    Sick on stomach, sweaty, throw up, pain   Levemir [Insulin Detemir] Hives and Swelling   Clindamycin Nausea And Vomiting     Medications Reviewed Today     Reviewed by Cherre Robins, PharmD (Pharmacist) on 06/02/21 at 1330  Med List Status: <None>   Medication Order Taking? Sig Documenting Provider Last Dose Status Informant  Alcohol Swabs (B-D SINGLE USE SWABS REGULAR) PADS [id number] Yes USE AS DIRECTED Carollee Herter, Alferd Apa, DO Taking Active   alendronate (FOSAMAX) 70 MG tablet [id number] Yes Take with a full glass of water on an empty stomach. Carollee Herter, Kendrick Fries R, DO Taking Active   ALPRAZolam Duanne Moron) 0.25 MG tablet [id number] Yes Take 1 tablet by mouth three times a day if needed Ann Held, DO Taking Active   amLODipine (NORVASC) 10 MG tablet [id number] Yes TAKE ONE TABLET BY MOUTH ONCE DAILY Ann Held, DO Taking Active   aspirin EC 81 MG tablet [id number] Yes Take 81 mg by mouth daily. [provider] Taking Active Self  Blood Glucose Calibration (TAI [facility] CONTROL) NORMAL SOLN [id number] Yes  [provider] Taking Active Self  Blood Glucose Monitoring Suppl (TRUE METRIX AIR GLUCOSE METER) w/Device KIT [id number] Yes 1 each by Does not apply route 4 (four) times daily. DX CODE E11.8 Ann Held, DO Taking Active   Carboxymethylcellulose Sodium (THERATEARS OP) [id number] Yes Place 1 drop into both eyes daily as needed (dry eyes). [provider] Taking Active Self  Continuous Blood Gluc Receiver ([place]) DEVI [id number] Yes 1 Device by Does not apply route as directed. Shamleffer, Melanie Crazier, MD Taking Active   Continuous Blood Gluc Sensor (DEXCOM G6 SENSOR) MISC [id number] Yes 1 Device by Does not apply route as directed. Shamleffer, Melanie Crazier, MD Taking Active   Continuous Blood Gluc Transmit (DEXCOM G6 TRANSMITTER) MISC [id number] Yes 1 Device by Does not apply route as directed. Shamleffer, Melanie Crazier, MD Taking Active   empagliflozin (JARDIANCE) 25 MG TABS tablet [id number] Yes Take 1 tablet (25 mg total) by mouth daily before  breakfast. Shamleffer, Melanie Crazier, MD Taking Active   fenofibrate 160 MG tablet [id number] Yes TAKE ONE TABLET BY MOUTH EVERY EVENING Ann Held, DO Taking Active   ferrous sulfate 325 (65 FE) MG tablet [id number] Yes Take 325 mg by mouth daily with breakfast.  [provider] Taking Active Self  Flaxseed, Linseed, (FLAXSEED OIL PO) [id number] Yes Take 1 capsule by mouth 2 (two) times daily.  [provider] Taking Active Self  FLUoxetine (PROZAC) 40 MG capsule [id number] Yes TAKE ONE CAPSULE BY MOUTH ONCE DAILY Carollee Herter, Alferd Apa, DO Taking Active   fluticasone (FLONASE) 50 MCG/ACT nasal spray [id number] Yes SHAKE LIQUID AND USE 2 SPRAYS IN [facility] NOSTRIL DAILY Roma Schanz R, DO Taking Active   glucose blood (TRUE METRIX BLOOD GLUCOSE TEST) test strip [id number] Yes USE AS DIRECTED 4 TIMES A DAY Ann Held, DO Taking Active   Discontinued 69/48/54 6270 (Duplicate)   HYDROcodone-acetaminophen (NORCO) 5-325 MG tablet [id number] Yes Take 1 tablet by mouth every 6 (six) hours as needed for moderate pain. Roma Schanz R, DO Taking Active   Insulin Pen Needle 32G X 4 MM MISC [id number] Yes 1 Device by Does not apply route 2 (two) times daily. Shamleffer, Melanie Crazier, MD Taking Active   Lancet Devices (LANCING DEVICE) [place] [id number] Yes  [provider] Taking Active Self  levocetirizine (XYZAL) 5 MG tablet [id number] Yes TAKE 1 TABLET(5 MG) BY MOUTH EVERY EVENING Ann Held, DO Taking Active   lisinopril (ZESTRIL) 40 MG tablet [id number] Yes Take 1 tablet (40 mg total) by mouth daily. Buford Dresser, MD Taking Active   meloxicam [facility]) 7.5 MG tablet [id number] Yes Take 1 tablet (7.5 mg total) by mouth daily as needed for pain. Ann Held, DO Taking Active   methocarbamol (ROBAXIN) 500 MG tablet [id number] Yes Take 1 tablet (500 mg total) by mouth 2 (two) times daily as needed. Aundra Dubin, PA-C Taking Active    metoprolol succinate (TOPROL-XL) 50 MG 24 hr tablet [id number] Yes TAKE ONE TABLET BY MOUTH ONCE DAILY WITH FOOD Carollee Herter, Alferd Apa, DO Taking Active   Multiple Vitamin (MULTIVITAMIN WITH MINERALS) TABS [id number] Yes Take 1 tablet by mouth daily. [provider] Taking Active Self  NOVOLIN 70/30 KWIKPEN (70-30) 100 UNIT/ML KwikPen [id number] Yes Inject 46 Units into the skin daily before breakfast AND 40 Units daily before supper. Shamleffer, Melanie Crazier, MD Taking Active   Omega-3 Fatty Acids ([place]) 1000 MG CAPS [id number]  Yes Take by mouth. [provider] Taking Active            Med Note Elana Alm, Kennon Rounds Oct 14, 2020  9:11 AM)    rosuvastatin (CRESTOR) 20 MG tablet [id number] Yes TAKE ONE TABLET BY MOUTH EVERY EVENING Ann Held, DO Taking Active   traMADol (ULTRAM) 50 MG tablet [id number] Yes Take 1 tablet (50 mg total) by mouth 2 (two) times daily as needed. Aundra Dubin, PA-C Taking Active   TRUEplus Lancets 33G MISC [id number] Yes USE AS DIRECTED 4 TIMES A DAY Ann Held, DO Taking Active   vitamin E 400 UNIT capsule [id number] Yes Take 400 Units by mouth daily. [provider] Taking Active Self            Patient Active Problem List   Diagnosis Date Noted   Diabetes mellitus ([place]) 01/17/2021   Type 2 diabetes mellitus with hyperglycemia, without long-term current use of insulin ([place]) 01/17/2021   Acute pain of right shoulder 08/18/2020   Primary osteoarthritis of right knee 08/18/2020   Impacted cerumen of right ear 05/24/2020   Bilateral impacted cerumen 05/17/2020   Hyperlipidemia associated with type 2 diabetes mellitus ([place]) 03/10/2020   Cough 03/10/2020   Seasonal allergies 03/10/2020   Elevated rheumatoid factor 07/21/2018   Gastroenteritis 07/21/2018   Trigger middle finger of left hand 12/19/2017   Ganglion cyst of dorsum of right wrist 12/19/2017   Morton's neuroma of right foot 12/19/2017   Preventative  health care 12/13/2017   Pain in both hands 12/13/2017   Hyperlipidemia LDL goal <100 12/13/2017   Abscess of upper gum 12/13/2017   Spinal stenosis of cervical region 09/25/2016   Cervical radiculopathy 09/06/2016   Dyspnea 02/18/2016   Chest pain 02/18/2016   HLD (hyperlipidemia) 02/18/2016   CAD in native artery 02/18/2016   RLS (restless legs syndrome) 02/18/2016   Pancreatitis 02/18/2016   SOB (shortness of breath) 02/18/2016   Uncontrolled type 2 diabetes mellitus with complication (HCC)    Tobacco abuse    Osteoarthritis of right knee 07/05/2015   Smoking 07/02/2013   Acute upper respiratory infections of unspecified site 07/02/2013   Flu vaccine need 07/02/2013   Multinodular goiter 04/16/2013   Bunion 07/07/2012   BACK PAIN, THORACIC REGION, RIGHT 05/24/2009   IRON DEFICIENCY 03/25/2009   GERD 03/25/2009   WEIGHT LOSS 03/25/2009   SINUSITIS- ACUTE-NOS 11/25/2008   Hyperlipidemia LDL goal <70 10/07/2008   ACUTE PANCREATITIS 09/07/2008   OVARIAN CYST 09/07/2008   KNEE PAIN, RIGHT 05/24/2008   CARPAL TUNNEL SYNDROME, RIGHT 05/12/2008   Acute pain of both shoulders 04/02/2008   ROTATOR CUFF REPAIR, RIGHT, HX OF 03/29/2008   GOITER, MULTINODULAR 12/26/2007   Depression 07/07/2007   HIP PAIN, RIGHT 03/11/2007   RESTLESS LEG SYNDROME, HX OF 03/11/2007   Diabetes mellitus type II, uncontrolled ([place]) 03/07/2007   Essential hypertension 03/07/2007    Immunization History  Administered Date(s) Administered   Fluad Quad(high Dose 65+) 07/28/2019   Influenza Split 08/08/2012   Influenza, High Dose Seasonal PF 08/09/2014, 07/05/2015, 07/21/2018   PFIZER(Purple Top)SARS-COV-2 Vaccination 12/07/2019, 01/04/2020   PPD Test 08/27/2011, 09/04/2011, 07/13/2014, 09/23/2015   Pneumococcal Conjugate-13 08/09/2014   Pneumococcal Polysaccharide-23 11/17/2013   Td 03/11/2007   Zoster, Live 03/11/2007    Conditions to be addressed/monitored: CAD, HTN, HLD, DMII, Anxiety,  Depression and shoulder pain; OA of knee; osteoporosis; h/o pancreatitis  Care Plan : General Pharmacy (Adult)  Updates made by  Cherre Robins, PHARMD since 06/02/2021 12:00 AM     Problem: Chronic Disease Management support, education, and care coordination needs related to HTN, Hyperlipidemia; CAD, Diabetes, Depression/Anxiety, Osteoporosis, Neuropathy   Priority: High  Onset Date: 02/23/2021  Note:   Current Barriers:  Unable to independently afford treatment regimen Unable to achieve control of type 2 DM  Does not adhere to prescribed medication regimen Chronic Disease Management support, education, and care coordination needs related to HTN, Hyperlipidemia, CAD, Diabetes, Depression/Anxiety, Osteoporosis, Neuropathy  Pharmacist Clinical Goal(s):  Over the next 90 days, patient will verbalize ability to afford treatment regimen achieve control of type 2 DM as evidenced by A1c <7.0% maintain control of HTN and lipids as evidenced by BP <130/80 (per cardio) and LDL <70  adhere to prescribed medication regimen as evidenced by patient report of taking medications daily  through collaboration with PharmD and provider.   Interventions: 1:1 collaboration with Carollee Herter, Alferd Apa, DO regarding development and update of comprehensive plan of care as evidenced by provider attestation and co-signature Inter-disciplinary care team collaboration (see longitudinal plan of care) Comprehensive medication review performed; medication list updated in electronic medical record  Hypertension Controlled; BP goal <130/80 Patient checks BP at home infrequently - usually once per week  Patient has failed these meds in the past: None noted  Current regimen:  Amlodipine 59m daily Lisinopril 428mdaily Metoprolol Succinate 5024maily Interventions: (addressed at previous visit)  Requested patient to check her blood pressure 2 times per week and record Ensure daily salt intake < 2300 mg/day Continue  current regimen for blood pressure  Hyperlipidemia / CAD Controlled; LDL goal < 70 Current regimen:  Aspirin 58m50mily Fenofibrate 160mg75mly Omega 3 fish Oil 1030mg 31me daily Rosuvastatin 20mg d18m Interventions: Reviewed last lipid panel with patient. Has maintained LDL at goal and Tg improved from 344 in 2021 to 145 on 12/06/2020 Continue current regimen for heart health and cholesterol Continue to follow low fat diet  Diabetes Lab Results  Component Value Date   HGBA1C 8.1 (A) 05/10/2021   HGBA1C 8.4 (H) 04/24/2021   HGBA1C 8.4 (H) 02/16/2021   A1c improving but not at goal. Initially goal is to get A1c <8.0 so patient can have shoulder surgery but eventually would like A1c goal <7% as long as doesn't increase hypoglycemia Followed by Dr ShamlefKelton Pillar appt was 05/10/2021 Current regimen:  Humulin 70/30 - 47 units with breakfast and 40 units with evening meal Jardiance 25mg - 42m 1 tablet each morning Past medications - Januvia 100mg dai89mnd glimepiride 2mg qam (69mcontinue by Dr ShamlefferKelton Pillartory of pancreatitis - so no GLP1s or DPP4s Patient reports she is in Medicare coverage gap for Jardiance. Jardiance also is no longer included in her morning packaged medications but in a separate bottle. She misses doses of Jardiance about 2 times per week and also sometimes misses insulin dose in evening.  We have applied for PAP for Jardiance 04/28/2021. Called [employer] program to check status. Reports that patient was mailed a letter that she needed to apply for LIS and if denied then they will re-evaluate her application. Patient does not recall getting this letter.  Interventions: Assisted patient in applying for low income subsidy for Medicare If approved then generic meds will be about $3 to 4 and brand $9 to 10  If denied then will send [employer] coPalmerenial letter so they can continue to process app for Jardiance patient assistance.  Discussed DM  goals Fasting  (before meal blood sugar): 80-130 Post-Prandial (2 hrs after eating blood sugar): less than 180 A1c (3 month blood sugar average): less than 7% Collaborated with [facility] to find solution to having Jardiance included in medication packaging again to improved adherence.   Depression / anxiety Patient report anxiety and mood are controlled with current regimen Using alprazolam sparingly - last RF was 09/07/2020 for #60 tabs Patient has failed these meds in past: sertraline (palpitations) Current regimen:  Alprazolam 0.32m three times daily as needed  Fluoxetine 422mdaily Interventions: (not addressed at this visit) Collaboration with provider regarding medication management (refills needed for alprazolam) - message sent to request refill. Maintain current regimen for depression and anxiety  Osteoporosis  Last DEXA Scan: 05/29/2017             T-Score femoral neck: -2.0             T-Score forearm radius: -2.5 Current regimen:  Alendronate 7031meekly Interventions: (addressed at previous visit) Consider repeat DEXA Scan Continue current regimen  Shoulder Pain / osteoarthritis of right knee:  Surgery is planned once A1c is < 8.0% Current regimen:  Tramadol 1m86m to 3 times a day if needed for pain Interventions:  Discuss importance of taking diabetes meds daily and getting A1c <8.0 so surgery can be scheduled.  Continue to follow up with orthopedic office for pain management   Medication management Pharmacist Clinical Goal(s): Over the next 90 days, patient will work with PharmD and providers to maintain optimal medication adherence Current pharmacy: [facility] Interventions Comprehensive medication review performed. Utilize [facility] pharmacy for medication synchronization, packaging and delivery Will coordinate with [facility] to get Jardiance added back to medication pill packs  Patient Goals/Self-Care Activities Over the next 90 days, patient will:  take  medications as prescribed, check glucose with CGM, document, and provide at future appointments, and collaborate with provider on medication access solutions  Follow Up Plan: Telephone follow up appointment with care management team member scheduled for:  3 weeks  with clinical pharmacist;            Medication Assistance: Application for Jardiance  medication assistance program. in process.  Anticipated assistance start date 07/07/2021.  See plan of care for additional detail. Application stalled. [employer] needs letter stating that patient has been denied LIS. Assisted patient in applying for LIS today.  She should received letter in 3 weeks.   Patient's preferred pharmacy is:  [facility] [place] [place]: [phone]: [phone] [address] [facility]: [phone]: 336-[place] -[place]: [phone]: [phone]ne: [phone]: [phone]/pharmacy #[id number] [place]- [place] [place]: [phone] (New Address) - [place]: [place] [address] [place]e: [phone] Up:  Patient  agrees to Care Plan and Follow-up.  Plan: Telephone follow up appointment with care management team member scheduled  for:  3 weeks  (To check on LIS extra helps status)  Cherre Robins, PharmD Clinical Pharmacist [facility]

## 2021-06-05 DIAGNOSIS — Z6828 Body mass index (BMI) 28.0-28.9, adult: Secondary | ICD-10-CM | POA: Diagnosis not present

## 2021-06-05 DIAGNOSIS — I10 Essential (primary) hypertension: Secondary | ICD-10-CM | POA: Diagnosis not present

## 2021-06-05 DIAGNOSIS — M4722 Other spondylosis with radiculopathy, cervical region: Secondary | ICD-10-CM | POA: Diagnosis not present

## 2021-06-05 NOTE — Telephone Encounter (Signed)
Pt would need a OV or virtual please

## 2021-06-05 NOTE — Telephone Encounter (Signed)
Mail box is full & I could not leave a voicemail for patient to call back to schedule an appointment...  [facility] is also not set up so I could not send out a message thru there.

## 2021-06-06 ENCOUNTER — Telehealth: Payer: Medicare HMO

## 2021-06-16 ENCOUNTER — Telehealth: Payer: Self-pay

## 2021-06-16 NOTE — Telephone Encounter (Signed)
Pt called in wanted to be seen but we had no appointments available, I gave the pt the new urgent cares address

## 2021-06-20 ENCOUNTER — Other Ambulatory Visit: Payer: Self-pay

## 2021-06-20 ENCOUNTER — Ambulatory Visit (INDEPENDENT_AMBULATORY_CARE_PROVIDER_SITE_OTHER): Payer: Medicare HMO | Admitting: Family Medicine

## 2021-06-20 ENCOUNTER — Ambulatory Visit (INDEPENDENT_AMBULATORY_CARE_PROVIDER_SITE_OTHER): Payer: Medicare HMO | Admitting: Pharmacist

## 2021-06-20 ENCOUNTER — Encounter: Payer: Self-pay | Admitting: Family Medicine

## 2021-06-20 VITALS — BP 120/66 | HR 60 | Temp 98.7°F | Resp 18 | Ht 65.0 in | Wt 176.6 lb

## 2021-06-20 DIAGNOSIS — F418 Other specified anxiety disorders: Secondary | ICD-10-CM | POA: Diagnosis not present

## 2021-06-20 DIAGNOSIS — F419 Anxiety disorder, unspecified: Secondary | ICD-10-CM

## 2021-06-20 DIAGNOSIS — Z23 Encounter for immunization: Secondary | ICD-10-CM | POA: Diagnosis not present

## 2021-06-20 DIAGNOSIS — E1165 Type 2 diabetes mellitus with hyperglycemia: Secondary | ICD-10-CM

## 2021-06-20 MED ORDER — FLUOXETINE HCL 20 MG PO TABS: ORAL_TABLET | ORAL | 3 refills | Status: DC

## 2021-06-20 MED ORDER — ALPRAZOLAM 0.25 MG PO TABS: ORAL_TABLET | ORAL | 0 refills | Status: DC

## 2021-06-20 NOTE — Progress Notes (Signed)
Subjective:   By signing my name below, I, Shehryar Baig, attest that this documentation has been prepared under the direction and in the presence of Ann Held, DO  06/20/2021      Patient ID: [name], female    DOB: [birth year], 75 y.o.   MRN: [id number]  Chief Complaint  Patient presents with  . Depression    Pt states wanting to discuss Xanax and Prozac. Pt states she feels the medication is working like it use too.   [name] Anxiety  . Follow-up    HPI Patient is in today for a office visit. She reports her depression and anxiety has worsened. Her depression is worse than her anxiety. She is currently taking 40 mg Prozac daily PO for depression and anxiety but finds no improvement. She is crying most nights before sleeping. She is not talking to a counselor at this time but is willing to try and make an appointment.  She also reports having increased insomnia. She continues taking 0.25 xanax 3x daily PO PRN to manage her sleep and finds relief. She is also requesting a refill on it as well.  She was not given her Dexcom device at this time.  She is interested in getting the flu vaccine during this visit.   Past Medical History:  Diagnosis Date  . Blockage of coronary artery of heart ([place])   . Depression   . Diabetes mellitus   . Hyperlipidemia   . Hypertension   . Pancreatitis   . Restless leg syndrome     Past Surgical History:  Procedure Laterality Date  . CATARACT EXTRACTION, BILATERAL    . CORONARY ANGIOPLASTY WITH STENT PLACEMENT    . FOOT SURGERY Right    cyst removal  . ROTATOR CUFF REPAIR     right  . ROTATOR CUFF REPAIR Left   . TUBAL LIGATION      Family History  Problem Relation Age of Onset  . Heart attack Father   . Heart attack Brother   . Diabetes Sister   . Cirrhosis Sister   . Hypertension Sister   . Cancer Mother   . Hypertension Mother   . Vision loss Mother   . Cancer Sister   . Sleep apnea Sister   . Heart Problems  Sister   . Diabetes Sister   . Gout Sister   . Diabetes Son   . Heart attack Son   . Diabetes Daughter   . Goiter Daughter   . Diabetes Daughter     Social History   Socioeconomic History  . Marital status: Married    Spouse name: Not on file  . Number of children: Not on file  . Years of education: Not on file  . Highest education level: Not on file  Occupational History  . Not on file  Tobacco Use  . Smoking status: Former    Packs/day: 0.20    Types: Cigarettes    Quit date: 05/05/2020    Years since quitting: 1.1  . Smokeless tobacco: Never  . Tobacco comments:    quit in 2021  Vaping Use  . Vaping Use: Never used  Substance and Sexual Activity  . Alcohol use: No  . Drug use: No  . Sexual activity: Not on file  Other Topics Concern  . Not on file  Social History Narrative   Exercise: seldom   Caffeine use: seldom   Social Determinants of Health   Financial Resource Strain: Medium Risk  .  Difficulty of Paying Living Expenses: Somewhat hard  Food Insecurity: Not on file  Transportation Needs: Not on file  Physical Activity: Inactive  . Days of Exercise per Week: 0 days  . Minutes of Exercise per Session: 0 min  Stress: Not on file  Social Connections: Not on file  Intimate Partner Violence: Not on file    Outpatient Medications Prior to Visit  Medication Sig Dispense Refill  . Alcohol Swabs (B-D SINGLE USE SWABS REGULAR) PADS USE AS DIRECTED 400 each 1  . alendronate (FOSAMAX) 70 MG tablet Take with a full glass of water on an empty stomach. 12 tablet 2  . amLODipine (NORVASC) 10 MG tablet TAKE ONE TABLET BY MOUTH ONCE DAILY 90 tablet 1  . aspirin EC 81 MG tablet Take 81 mg by mouth daily.    . Blood Glucose Calibration (TAI DOC CONTROL) NORMAL SOLN     . Blood Glucose Monitoring Suppl (TRUE METRIX AIR GLUCOSE METER) w/Device KIT 1 each by Does not apply route 4 (four) times daily. DX CODE E11.8 1 kit 0  . Carboxymethylcellulose Sodium (THERATEARS OP)  Place 1 drop into both eyes daily as needed (dry eyes).    . empagliflozin (JARDIANCE) 25 MG TABS tablet Take 1 tablet (25 mg total) by mouth daily before breakfast. 90 tablet 3  . fenofibrate 160 MG tablet TAKE ONE TABLET BY MOUTH EVERY EVENING 90 tablet 1  . ferrous sulfate 325 (65 FE) MG tablet Take 325 mg by mouth daily with breakfast.     . Flaxseed, Linseed, (FLAXSEED OIL PO) Take 1 capsule by mouth 2 (two) times daily.     [name] FLUoxetine (PROZAC) 40 MG capsule TAKE ONE CAPSULE BY MOUTH ONCE DAILY 90 capsule 1  . fluticasone (FLONASE) 50 MCG/ACT nasal spray SHAKE LIQUID AND USE 2 SPRAYS IN EACH NOSTRIL DAILY 48 g 1  . glucose blood (TRUE METRIX BLOOD GLUCOSE TEST) test strip USE AS DIRECTED 4 TIMES A DAY 400 strip 1  . HYDROcodone-acetaminophen (NORCO) 5-325 MG tablet Take 1 tablet by mouth every 6 (six) hours as needed for moderate pain. 30 tablet 0  . Insulin Pen Needle 32G X 4 MM MISC 1 Device by Does not apply route 2 (two) times daily. 200 each 3  . Lancet Devices (LANCING DEVICE) MISC     . levocetirizine (XYZAL) 5 MG tablet TAKE 1 TABLET(5 MG) BY MOUTH EVERY EVENING 90 tablet 1  . lisinopril (ZESTRIL) 40 MG tablet Take 1 tablet (40 mg total) by mouth daily. 90 tablet 3  . meloxicam (MOBIC) 7.5 MG tablet Take 1 tablet (7.5 mg total) by mouth daily as needed for pain. 30 tablet 0  . methocarbamol (ROBAXIN) 500 MG tablet Take 1 tablet (500 mg total) by mouth 2 (two) times daily as needed. 20 tablet 0  . metoprolol succinate (TOPROL-XL) 50 MG 24 hr tablet TAKE ONE TABLET BY MOUTH ONCE DAILY WITH FOOD 90 tablet 1  . Multiple Vitamin (MULTIVITAMIN WITH MINERALS) TABS Take 1 tablet by mouth daily.    [name] NOVOLIN 70/30 KWIKPEN (70-30) 100 UNIT/ML KwikPen Inject 46 Units into the skin daily before breakfast AND 40 Units daily before supper. 75 mL 3  . Omega-3 Fatty Acids (FISH OIL) 1000 MG CAPS Take by mouth.    . rosuvastatin (CRESTOR) 20 MG tablet TAKE ONE TABLET BY MOUTH EVERY EVENING 90 tablet  1  . traMADol (ULTRAM) 50 MG tablet Take 1 tablet (50 mg total) by mouth 2 (two) times daily as  needed. 60 tablet 2  . TRUEplus Lancets 33G MISC USE AS DIRECTED 4 TIMES A DAY 400 each 1  . vitamin E 400 UNIT capsule Take 400 Units by mouth daily.    [name] ALPRAZolam (XANAX) 0.25 MG tablet Take 1 tablet by mouth three times a day if needed 60 tablet 0  . Continuous Blood Gluc Receiver (DEXCOM G6 RECEIVER) DEVI 1 Device by Does not apply route as directed. (Patient not taking: Reported on 06/20/2021) 1 each 0  . Continuous Blood Gluc Sensor (DEXCOM G6 SENSOR) MISC 1 Device by Does not apply route as directed. (Patient not taking: Reported on 06/20/2021) 9 each 3  . Continuous Blood Gluc Transmit (DEXCOM G6 TRANSMITTER) MISC 1 Device by Does not apply route as directed. (Patient not taking: Reported on 06/20/2021) 1 each 3   No facility-administered medications prior to visit.    Allergies  Allergen Reactions  . Metformin And Related Other (See Comments)    Sick on stomach, sweaty, throw up, pain  . Levemir [Insulin Detemir] Hives and Swelling  . Clindamycin Nausea And Vomiting    Review of Systems  Constitutional:  Negative for fever and malaise/fatigue.  HENT:  Negative for congestion.   Eyes:  Negative for blurred vision.  Respiratory:  Negative for cough and shortness of breath.   Cardiovascular:  Negative for chest pain, palpitations and leg swelling.  Gastrointestinal:  Negative for vomiting.  Musculoskeletal:  Negative for back pain.  Skin:  Negative for rash.  Neurological:  Negative for loss of consciousness and headaches.  Psychiatric/Behavioral:  Positive for depression. The patient is nervous/anxious and has insomnia.       Objective:    Physical Exam Constitutional:      General: She is not in acute distress.    Appearance: Normal appearance. She is not ill-appearing.  HENT:     Head: Normocephalic and atraumatic.     Right Ear: External ear normal.     Left Ear: External  ear normal.  Eyes:     Extraocular Movements: Extraocular movements intact.     Pupils: Pupils are equal, round, and reactive to light.  Cardiovascular:     Rate and Rhythm: Normal rate and regular rhythm.     Heart sounds: Normal heart sounds. No murmur heard.   No gallop.  Pulmonary:     Effort: Pulmonary effort is normal. No respiratory distress.     Breath sounds: Normal breath sounds. No wheezing or rales.  Skin:    General: Skin is warm and dry.  Neurological:     Mental Status: She is alert and oriented to person, place, and time.  Psychiatric:        Behavior: Behavior normal.        Judgment: Judgment normal.    BP 120/66 (BP Location: Right Arm, Patient Position: Sitting, Cuff Size: Normal)   Pulse 60   Temp 98.7 F (37.1 C) (Oral)   Resp 18   Ht '5\' 5"'  (1.651 m)   Wt 176 lb 9.6 oz (80.1 kg)   SpO2 98%   BMI 29.39 kg/m  Wt Readings from Last 3 Encounters:  06/20/21 176 lb 9.6 oz (80.1 kg)  05/10/21 175 lb 12.8 oz (79.7 kg)  05/04/21 175 lb (79.4 kg)    Diabetic Foot Exam - Simple   No data filed    Lab Results  Component Value Date   WBC 7.0 12/06/2020   HGB 12.2 12/06/2020   HCT 36.6 12/06/2020   PLT  182.0 12/06/2020   GLUCOSE 87 04/24/2021   CHOL 116 04/24/2021   TRIG 271.0 (H) 04/24/2021   HDL 28.70 (L) 04/24/2021   LDLDIRECT 53.0 04/24/2021   LDLCALC 57 12/06/2020   ALT 20 04/24/2021   AST 21 04/24/2021   NA 138 04/24/2021   K 3.7 04/24/2021   CL 103 04/24/2021   CREATININE 0.94 04/24/2021   BUN 22 04/24/2021   CO2 26 04/24/2021   TSH 0.58 08/11/2018   INR 1.1 03/03/2009   HGBA1C 8.1 (A) 05/10/2021   MICROALBUR 12.2 (H) 12/06/2020    Lab Results  Component Value Date   TSH 0.58 08/11/2018   Lab Results  Component Value Date   WBC 7.0 12/06/2020   HGB 12.2 12/06/2020   HCT 36.6 12/06/2020   MCV 87.5 12/06/2020   PLT 182.0 12/06/2020   Lab Results  Component Value Date   NA 138 04/24/2021   K 3.7 04/24/2021   CO2 26  04/24/2021   GLUCOSE 87 04/24/2021   BUN 22 04/24/2021   CREATININE 0.94 04/24/2021   BILITOT 0.4 04/24/2021   ALKPHOS 35 (L) 04/24/2021   AST 21 04/24/2021   ALT 20 04/24/2021   PROT 6.7 04/24/2021   ALBUMIN 4.4 04/24/2021   CALCIUM 10.1 04/24/2021   ANIONGAP 13 09/23/2020   GFR 59.45 (L) 04/24/2021   Lab Results  Component Value Date   CHOL 116 04/24/2021   Lab Results  Component Value Date   HDL 28.70 (L) 04/24/2021   Lab Results  Component Value Date   LDLCALC 57 12/06/2020   Lab Results  Component Value Date   TRIG 271.0 (H) 04/24/2021   Lab Results  Component Value Date   CHOLHDL 4 04/24/2021   Lab Results  Component Value Date   HGBA1C 8.1 (A) 05/10/2021       Assessment & Plan:   Problem List Items Addressed This Visit       Unprioritized   Depression with anxiety - Primary    Increase prozac to 60 mg daily and f/u in 1 month Xanax refilled List of counselors given to the pt as well       Relevant Medications   FLUoxetine (PROZAC) 20 MG tablet   ALPRAZolam (XANAX) 0.25 MG tablet   Other Visit Diagnoses     Need for influenza vaccination       Relevant Orders   Flu Vaccine QUAD High Dose(Fluad)   Anxiety       Relevant Medications   FLUoxetine (PROZAC) 20 MG tablet   ALPRAZolam (XANAX) 0.25 MG tablet        Meds ordered this encounter  Medications  . FLUoxetine (PROZAC) 20 MG tablet    Sig: 3 po qd    Dispense:  270 tablet    Refill:  3  . ALPRAZolam (XANAX) 0.25 MG tablet    Sig: Take 1 tablet by mouth three times a day if needed    Dispense:  60 tablet    Refill:  0    I, Ann Held, DO, personally preformed the services described in this documentation.  All medical record entries made by the scribe were at my direction and in my presence.  I have reviewed the chart and discharge instructions (if applicable) and agree that the record reflects my personal performance and is accurate and complete.  06/20/2021   I,Shehryar Baig,acting as a Education administrator for [employer], DO.,have documented all relevant documentation on the behalf of [employer]  Carollee Herter, DO,as directed by  Ann Held, DO while in the presence of Ann Held, DO.   Ann Held, DO

## 2021-06-20 NOTE — Patient Instructions (Signed)
Major Depressive Disorder, Adult Major depressive disorder (MDD) is a mental health condition. It may also be called clinical depression or unipolar depression. MDD causes symptoms of sadness, hopelessness, and loss of interest in things. These symptoms last most of the day, almost every day, for 2 weeks. MDD can also cause physical symptoms. It can interfere with relationships and with everyday activities,such as work, school, and activities that are usually pleasant. MDD may be mild, moderate, or severe. It may be single-episode MDD, whichhappens once, or recurrent MDD, which may occur multiple times. What are the causes? The exact cause of this condition is not known. MDD is most likely caused by a combination of things, which may include: Your personality traits. Learned or conditioned behaviors or thoughts or feelings that reinforce negativity. Any alcohol or substance misuse. Long-term (chronic) physical or mental health illness. Going through a traumatic experience or major life changes. What increases the risk? The following factors may make someone more likely to develop MDD: A family history of depression. Being a woman. Troubled family relationships. Abnormally low levels of certain brain chemicals. Traumatic or painful events in childhood, especially abuse or loss of a parent. A lot of stress from life experiences, such as poor living conditions or discrimination. Chronic physical illness or other mental health disorders. What are the signs or symptoms? The main symptoms of MDD usually include: Constant depressed or irritable mood. A loss of interest in things and activities. Other symptoms include: Sleeping or eating too much or too little. Unexplained weight gain or weight loss. Tiredness or low energy. Being agitated, restless, or weak. Feeling hopeless, worthless, or guilty. Trouble thinking clearly or making decisions. Thoughts of suicide or thoughts of harming  others. Isolating oneself or avoiding other people or activities. Trouble completing tasks, work, or any normal obligations. Severe symptoms of this condition may include: Psychotic depression.This may include false beliefs, or delusions. It may also include seeing, hearing, tasting, smelling, or feeling things that are not real (hallucinations). Chronic depression or persistent depressive disorder. This is low-level depression that lasts for at least 2 years. Melancholic depression, or feeling extremely sad and hopeless. Catatonic depression, which includes trouble speaking and trouble moving. How is this diagnosed? This condition may be diagnosed based on: Your symptoms. Your medical and mental health history. You may be asked questions about your lifestyle, including any drug and alcohol use. A physical exam. Blood tests to rule out other conditions. MDD is confirmed if you have the following symptoms most of the day, nearly every day, in a 2-week period: Either a depressed mood or loss of interest. At least four other MDD symptoms. How is this treated? This condition is usually treated by mental health professionals, such as psychologists, psychiatrists, and clinical social workers. You may need more than one type of treatment. Treatment may include: Psychotherapy, also called talk therapy or counseling. Types of psychotherapy include: Cognitive behavioral therapy (CBT). This teaches you to recognize unhealthy feelings, thoughts, and behaviors, and replace them with positive thoughts and actions. Interpersonal therapy (IPT). This helps you to improve the way you communicate with others or relate to them. Family therapy. This treatment includes members of your family. Medicines to treat anxiety and depression. These medicines help to balance the brain chemicals that affect your emotions. Lifestyle changes. You may be asked to: Limit alcohol use and avoid drug use. Get regular  exercise. Get plenty of sleep. Make healthy eating choices. Spend more time outdoors. Brain stimulation. This may be done   if symptoms are very severe and other treatments have not worked. Examples of this treatment are electroconvulsive therapy and transcranial magnetic stimulation. Follow these instructions at home: Activity Exercise regularly and spend time outdoors. Find activities that you enjoy doing, and make time to do them. Find healthy ways to manage stress, such as: Meditation or deep breathing. Spending time in nature. Journaling. Return to your normal activities as told by your health care provider. Ask your health care provider what activities are safe for you. Alcohol and drug use If you drink alcohol: Limit how much you use to: 0-1 drink a day for women who are not pregnant. 0-2 drinks a day for men. Be aware of how much alcohol is in your drink. In the [place] one drink equals one 12 oz bottle of beer (355 mL), one 5 oz glass of wine (148 mL), or one 1 oz glass of hard liquor (44 mL). Discuss your alcohol use with your health care provider. Alcohol can affect any antidepressant medicines you are taking. Discuss any drug use with your health care provider. General instructions  Take over-the-counter and prescription medicines only as told by your health care provider. Eat a healthy diet and get plenty of sleep. Consider joining a support group. Your health care provider may be able to recommend one. Keep all follow-up visits as told by your health care provider. This is important.  Where to find more information National Alliance on Mental Illness: www.nami.org [place] National Institute of Mental Health: www.nimh.nih.gov Contact a health care provider if: Your symptoms get worse. You develop new symptoms. Get help right away if: You self-harm. You have serious thoughts about hurting yourself or others. You hallucinate. If you ever feel like you may hurt yourself or  others, or have thoughts about taking your own life, get help right away. Go to your nearest emergency department or: Call your local emergency services (911 in the [place] Call a suicide crisis helpline, such as the National Suicide Prevention Lifeline at 1-800-273-8255. This is open 24 hours a day in the [place] Text the Crisis Text Line at 741741 (in the [place] Summary Major depressive disorder (MDD) is a mental health condition. MDD causes symptoms of sadness, hopelessness, and loss of interest in things. These symptoms last most of the day, almost every day, for 2 weeks. The symptoms of MDD can interfere with relationships and with everyday activities. Treatments and support are available for people who develop MDD. You may need more than one type of treatment. Get help right away if you have serious thoughts about hurting yourself or others. This information is not intended to replace advice given to you by your health care provider. Make sure you discuss any questions you have with your healthcare provider. Document Revised: 09/05/2019 Document Reviewed: 09/05/2019 [employer] Patient Education  2022 [employer]  

## 2021-06-20 NOTE — Chronic Care Management (AMB) (Signed)
Chronic Care Management Pharmacy Note  06/20/2021 Name:  [name] MRN:  [id number] DOB:  1946/01/27  Subjective: [name] is an 75 y.o. year old female who is a primary patient of Ann Held, DO.  The CCM team was consulted for assistance with disease management and care coordination needs.    Engaged with patient by telephone for  to follow up on decision form Medicare regarding extra assistance and complete medication assistance application for Jardiance / BI Cares program  in response to provider referral for pharmacy case management and/or care coordination services.   Consent to Services:  The patient was given information about Chronic Care Management services, agreed to services, and gave verbal consent prior to initiation of services.  Please see initial visit note for detailed documentation.   Patient Care Team: Carollee Herter, Alferd Apa, DO as PCP - General Buford Dresser, MD as PCP - Cardiology (Cardiology) Ashok Pall, MD as Consulting Physician (Neurosurgery) Monna Fam, MD as Consulting Physician (Ophthalmology) Cherre Robins, PharmD (Pharmacist) [facility], Melanie Crazier, MD as Consulting Physician (Endocrinology)  Recent office visits: 06/16/2021 - Phone call to PCP office. Patient requesting office visit. No same day appointments available. Patient provided address and contact informaiton for urgent care.  06/02/25/2022 - Phone call to PCP office. Patient feels fluoxetine and alprazolam not working. Asked for increase in dose. PCP recommended OV or video visit to discuss. Unable to reach patient to schedule appt. 04/24/2021 - PCP (Dr Carollee Herter) surgical clearance.Waiting for A1c < 8.0 to get shoulder surgery. Labs checked. A1c was 8.4%   12/29/2020 - PCP (Dr Etter Sjogren) f/u DM; Not controlled. Referred to endo. Increased Jardiance to 69m daily.  Recent consult visits: 05/10/2021 - Endo (Dr SKelton Pillar F/U DM with h/o  pancreatitis. A1c trending down - was 8.1%. Increaesd Novolin Mix to 46 units before breakfast and 40 units before evening meal. Prescribed DexCom  05/10/2021 - Ortho Surgery (Dr XErlinda Hong F/U back pain. Recommended epidural steroid but patient concerned about increasing BG. Instead referral place for neurosurgery.   01/27/2021 - Ortho (Dr XErlinda Hong: acute low back pain. Stated prednisone 541mdose pack and methocarbamol 50031mid prn.  01/17/2021- Endo (Dr ShaKelton Pillarnitial endocrinology visit. H/o pancreatitis. Januvia stopped. Due to history of pancreatitis, GLP-1 RA and DPP-4 inhibitors are contra-indicated. Stopped glimepiride due to increased risk of hypoglycemia with insulin. Continue Jardiance 25 mg, 1 tablet in the morning. Change Novolin Mix to 45 units before Breakfast and 40 units Before Supper. Patient is instructed to check blood sugars 2 times a day, before breakfast and.  Hospital visits: 05/04/2021 - ED Visit for lumbar radiculopathy. Prescribed predinsone 83m63mper for 6 days and robaxin 500mg29mry 8 hours. Also told to take OTC NSAIDs prn. Objective:  Lab Results  Component Value Date   CREATININE 0.94 04/24/2021   CREATININE 0.96 12/06/2020   CREATININE 0.82 09/23/2020    Lab Results  Component Value Date   HGBA1C 8.1 (A) 05/10/2021   Last diabetic Eye exam:  Lab Results  Component Value Date/Time   HMDIABEYEEXA Retinopathy (A) 12/30/2020 12:00 AM    Last diabetic Foot exam: No results found for: HMDIABFOOTEX  - Done 12/29/2020 by Dr ShamlKelton Pillar Component Value Date/Time   CHOL 116 04/24/2021 1542   TRIG 271.0 (H) 04/24/2021 1542   HDL 28.70 (L) 04/24/2021 1542   CHOLHDL 4 04/24/2021 1542   VLDL 54.2 (H) 04/24/2021 1542   LDLCALC 57 12/06/2020 1603   LDLCALC  61 06/15/2020 0819   LDLDIRECT 53.0 04/24/2021 1542    Hepatic Function Latest Ref Rng & Units 04/24/2021 12/06/2020 09/23/2020  Total Protein 6.0 - 8.3 g/dL 6.7 7.2 7.5  Albumin 3.5 - 5.2 g/dL 4.4 4.4 4.5   AST 0 - 37 U/L 21 16 21   ALT 0 - 35 U/L 20 16 22   Alk Phosphatase 39 - 117 U/L 35(L) 38(L) 43  Total Bilirubin 0.2 - 1.2 mg/dL 0.4 0.5 0.6  Bilirubin, Direct 0.0 - 0.3 mg/dL - - -    Lab Results  Component Value Date/Time   TSH 0.58 08/11/2018 02:34 PM   TSH 0.57 07/16/2018 09:50 AM   FREET4 0.74 08/11/2018 02:34 PM   FREET4 0.84 04/16/2013 11:19 AM    CBC Latest Ref Rng & Units 12/06/2020 09/23/2020 08/11/2018  WBC 4.0 - 10.5 K/uL 7.0 13.9(H) 11.2(H)  Hemoglobin 12.0 - 15.0 g/dL 12.2 13.5 12.3  Hematocrit 36.0 - 46.0 % 36.6 40.1 36.9  Platelets 150.0 - 400.0 K/uL 182.0 215 221.0    No results found for: VD25OH  Clinical ASCVD: Yes  The ASCVD Risk score (Arnett DK, et al., 2019) failed to calculate for the following reasons:   The valid total cholesterol range is 130 to 320 mg/dL    Other: DEXA 05/29/2017 Forearm Radius 33% T-score = -2.5.  DualFemur Neck Left  T-Score = -2.0; Which was a statistically significant increase in BMD of the left hip since prior exam dated 12/17/2013.  Social History   Tobacco Use  Smoking Status Former   Packs/day: 0.20   Types: Cigarettes   Quit date: 05/05/2020   Years since quitting: 1.1  Smokeless Tobacco Never  Tobacco Comments   quit in 2021   BP Readings from Last 3 Encounters:  05/10/21 138/74  05/04/21 (!) 167/61  04/24/21 130/62   Pulse Readings from Last 3 Encounters:  05/10/21 61  05/04/21 (!) 59  04/24/21 62   Wt Readings from Last 3 Encounters:  05/10/21 175 lb 12.8 oz (79.7 kg)  05/04/21 175 lb (79.4 kg)  04/24/21 175 lb 6.4 oz (79.6 kg)    Assessment: Review of patient past medical history, allergies, medications, health status, including review of consultants reports, laboratory and other test data, was performed as part of comprehensive evaluation and provision of chronic care management services.   SDOH:  (Social Determinants of Health) assessments and interventions performed:  SDOH Interventions     Flowsheet Row Most Recent Value  SDOH Interventions   Financial Strain Interventions Other (Comment)  [patient received denial letter for LIS. She will bring into office today when she sees PCP. Will forward to medication assistance program BI Care for Jardiance.]        CCM Care Plan  Allergies  Allergen Reactions   Metformin And Related Other (See Comments)    Sick on stomach, sweaty, throw up, pain   Levemir [Insulin Detemir] Hives and Swelling   Clindamycin Nausea And Vomiting    Medications Reviewed Today     Reviewed by Cherre Robins, PharmD (Pharmacist) on 06/20/21 at 18  Med List Status: <None>   Medication Order Taking? Sig Documenting Provider Last Dose Status Informant  Alcohol Swabs (B-D SINGLE USE SWABS REGULAR) PADS [id number] Yes USE AS DIRECTED Carollee Herter, Alferd Apa, DO Taking Active   alendronate (FOSAMAX) 70 MG tablet [id number] Yes Take with a full glass of water on an empty stomach. Roma Schanz R, DO Taking Active   ALPRAZolam Duanne Moron) 0.25 MG tablet [id number]  Yes Take 1 tablet by mouth three times a day if needed Carollee Herter, Alferd Apa, DO Taking Active   amLODipine (NORVASC) 10 MG tablet [id number] No TAKE ONE TABLET BY MOUTH ONCE DAILY  Patient not taking: Reported on 06/20/2021   Ann Held, DO Not Taking Active   aspirin EC 81 MG tablet [id number] Yes Take 81 mg by mouth daily. [provider] Taking Active Self  Blood Glucose Calibration (TAI DOC CONTROL) NORMAL SOLN [id number] Yes  [provider] Taking Active Self  Blood Glucose Monitoring Suppl (TRUE METRIX AIR GLUCOSE METER) w/Device KIT [id number] Yes 1 each by Does not apply route 4 (four) times daily. DX CODE E11.8 Ann Held, DO Taking Active   Carboxymethylcellulose Sodium (THERATEARS OP) [id number] Yes Place 1 drop into both eyes daily as needed (dry eyes). [provider] Taking Active Self  Continuous Blood Gluc Receiver ([place])  DEVI [id number] Yes 1 Device by Does not apply route as directed. Shamleffer, Melanie Crazier, MD Taking Active   Continuous Blood Gluc Sensor (DEXCOM G6 SENSOR) MISC [id number]  1 Device by Does not apply route as directed. Shamleffer, Melanie Crazier, MD  Active   Continuous Blood Gluc Transmit (DEXCOM G6 TRANSMITTER) MISC [id number]  1 Device by Does not apply route as directed. Shamleffer, Melanie Crazier, MD  Active   empagliflozin (JARDIANCE) 25 MG TABS tablet [id number] Yes Take 1 tablet (25 mg total) by mouth daily before breakfast. Shamleffer, Melanie Crazier, MD Taking Active   fenofibrate 160 MG tablet [id number] Yes TAKE ONE TABLET BY MOUTH EVERY EVENING Ann Held, DO Taking Active   ferrous sulfate 325 (65 FE) MG tablet [id number] Yes Take 325 mg by mouth daily with breakfast.  [provider] Taking Active Self  Flaxseed, Linseed, (FLAXSEED OIL PO) [id number] Yes Take 1 capsule by mouth 2 (two) times daily.  [provider] Taking Active Self  FLUoxetine (PROZAC) 40 MG capsule [id number] Yes TAKE ONE CAPSULE BY MOUTH ONCE DAILY Carollee Herter, Alferd Apa, DO Taking Active   fluticasone (FLONASE) 50 MCG/ACT nasal spray [id number] Yes SHAKE LIQUID AND USE 2 SPRAYS IN [facility] NOSTRIL DAILY Roma Schanz R, DO Taking Active   glucose blood (TRUE METRIX BLOOD GLUCOSE TEST) test strip [id number] Yes USE AS DIRECTED 4 TIMES A DAY Carollee Herter, Yvonne R, DO Taking Active   HYDROcodone-acetaminophen (NORCO) 5-325 MG tablet [id number] Yes Take 1 tablet by mouth every 6 (six) hours as needed for moderate pain. Roma Schanz R, DO Taking Active   Insulin Pen Needle 32G X 4 MM MISC [id number] Yes 1 Device by Does not apply route 2 (two) times daily. Shamleffer, Melanie Crazier, MD Taking Active   Lancet Devices (LANCING DEVICE) [place] [id number] Yes  [provider] Taking Active Self  levocetirizine (XYZAL) 5 MG tablet [id number] Yes TAKE 1 TABLET(5 MG) BY MOUTH EVERY EVENING  Ann Held, DO Taking Active   lisinopril (ZESTRIL) 40 MG tablet [id number] Yes Take 1 tablet (40 mg total) by mouth daily. Buford Dresser, MD Taking Active   meloxicam [facility]) 7.5 MG tablet [id number] Yes Take 1 tablet (7.5 mg total) by mouth daily as needed for pain. Ann Held, DO Taking Active   methocarbamol (ROBAXIN) 500 MG tablet [id number] Yes Take 1 tablet (500 mg total) by mouth 2 (two) times daily as needed. Aundra Dubin, PA-C Taking Active   metoprolol succinate (TOPROL-XL) 50 MG 24 hr tablet [id number] Yes TAKE  ONE TABLET BY MOUTH ONCE DAILY WITH FOOD Ann Held, DO Taking Active   Multiple Vitamin (MULTIVITAMIN WITH MINERALS) TABS [id number] Yes Take 1 tablet by mouth daily. [provider] Taking Active Self  NOVOLIN 70/30 KWIKPEN (70-30) 100 UNIT/ML KwikPen [id number] Yes Inject 46 Units into the skin daily before breakfast AND 40 Units daily before supper. Shamleffer, Melanie Crazier, MD Taking Active   Omega-3 Fatty Acids (FISH OIL) 1000 MG CAPS [id number] Yes Take by mouth. [provider] Taking Active            Med Note Elana Alm, [place] Rounds Oct 14, 2020  9:11 AM)    rosuvastatin (CRESTOR) 20 MG tablet [id number] Yes TAKE ONE TABLET BY MOUTH EVERY EVENING Ann Held, DO Taking Active   traMADol (ULTRAM) 50 MG tablet [id number] Yes Take 1 tablet (50 mg total) by mouth 2 (two) times daily as needed. Aundra Dubin, PA-C Taking Active   TRUEplus Lancets 33G MISC [id number] Yes USE AS DIRECTED 4 TIMES A DAY Ann Held, DO Taking Active   vitamin E 400 UNIT capsule [id number] Yes Take 400 Units by mouth daily. [provider] Taking Active Self            Patient Active Problem List   Diagnosis Date Noted   Diabetes mellitus ([place]) 01/17/2021   Type 2 diabetes mellitus with hyperglycemia, without long-term current use of insulin ([place]) 01/17/2021   Acute pain of right shoulder 08/18/2020    Primary osteoarthritis of right knee 08/18/2020   Impacted cerumen of right ear 05/24/2020   Bilateral impacted cerumen 05/17/2020   Hyperlipidemia associated with type 2 diabetes mellitus ([place]) 03/10/2020   Cough 03/10/2020   Seasonal allergies 03/10/2020   Elevated rheumatoid factor 07/21/2018   Gastroenteritis 07/21/2018   Trigger middle finger of left hand 12/19/2017   Ganglion cyst of dorsum of right wrist 12/19/2017   Morton's neuroma of right foot 12/19/2017   Preventative health care 12/13/2017   Pain in both hands 12/13/2017   Hyperlipidemia LDL goal <100 12/13/2017   Abscess of upper gum 12/13/2017   Spinal stenosis of cervical region 09/25/2016   Cervical radiculopathy 09/06/2016   Dyspnea 02/18/2016   Chest pain 02/18/2016   HLD (hyperlipidemia) 02/18/2016   CAD in native artery 02/18/2016   RLS (restless legs syndrome) 02/18/2016   Pancreatitis 02/18/2016   SOB (shortness of breath) 02/18/2016   Uncontrolled type 2 diabetes mellitus with complication ([place])    Tobacco abuse    Osteoarthritis of right knee 07/05/2015   Smoking 07/02/2013   Acute upper respiratory infections of unspecified site 07/02/2013   Flu vaccine need 07/02/2013   Multinodular goiter 04/16/2013   Bunion 07/07/2012   BACK PAIN, THORACIC REGION, RIGHT 05/24/2009   IRON DEFICIENCY 03/25/2009   GERD 03/25/2009   WEIGHT LOSS 03/25/2009   SINUSITIS- ACUTE-NOS 11/25/2008   Hyperlipidemia LDL goal <70 10/07/2008   ACUTE PANCREATITIS 09/07/2008   OVARIAN CYST 09/07/2008   KNEE PAIN, RIGHT 05/24/2008   CARPAL TUNNEL SYNDROME, RIGHT 05/12/2008   Acute pain of both shoulders 04/02/2008   ROTATOR CUFF REPAIR, RIGHT, HX OF 03/29/2008   GOITER, MULTINODULAR 12/26/2007   Depression 07/07/2007   HIP PAIN, RIGHT 03/11/2007   RESTLESS LEG SYNDROME, HX OF 03/11/2007   Diabetes mellitus type II, uncontrolled ([place]) 03/07/2007   Essential hypertension 03/07/2007    Immunization History  Administered  Date(s) Administered   Fluad Quad(high Dose 65+) 07/28/2019   Influenza  Split 08/08/2012   Influenza, High Dose Seasonal PF 08/09/2014, 07/05/2015, 07/21/2018   PFIZER(Purple Top)SARS-COV-2 Vaccination 12/07/2019, 01/04/2020   PPD Test 08/27/2011, 09/04/2011, 07/13/2014, 09/23/2015   Pneumococcal Conjugate-13 08/09/2014   Pneumococcal Polysaccharide-23 11/17/2013   Td 03/11/2007   Zoster, Live 03/11/2007    Conditions to be addressed/monitored: CAD, HTN, HLD, DMII, Anxiety, Depression and shoulder pain; OA of knee; osteoporosis; h/o pancreatitis  Care Plan : General Pharmacy (Adult)  Updates made by Cherre Robins, PHARMD since 06/20/2021 12:00 AM     Problem: Chronic Disease Management support, education, and care coordination needs related to HTN, Hyperlipidemia; CAD, Diabetes, Depression/Anxiety, Osteoporosis, Neuropathy   Priority: High  Onset Date: 02/23/2021  Note:   Current Barriers:  Unable to independently afford treatment regimen Unable to achieve control of type 2 DM  Does not adhere to prescribed medication regimen Chronic Disease Management support, education, and care coordination needs related to HTN, Hyperlipidemia, CAD, Diabetes, Depression/Anxiety, Osteoporosis, Neuropathy  Pharmacist Clinical Goal(s):  Over the next 90 days, patient will verbalize ability to afford treatment regimen achieve control of type 2 DM as evidenced by A1c <7.0% maintain control of HTN and lipids as evidenced by BP <130/80 (per cardio) and LDL <70  adhere to prescribed medication regimen as evidenced by patient report of taking medications daily  through collaboration with PharmD and provider.   Interventions: 1:1 collaboration with Carollee Herter, Alferd Apa, DO regarding development and update of comprehensive plan of care as evidenced by provider attestation and co-signature Inter-disciplinary care team collaboration (see longitudinal plan of care) Comprehensive medication review performed;  medication list updated in electronic medical record  Hypertension Controlled; BP goal <130/80 Patient checks BP at home infrequently - usually once per week  Patient has failed these meds in the past: None noted  Current regimen:  Amlodipine 39m daily Lisinopril 466mdaily Metoprolol Succinate 5078maily Interventions: (addressed at previous visit)  Requested patient to check her blood pressure 2 times per week and record Ensure daily salt intake < 2300 mg/day Continue current regimen for blood pressure  Hyperlipidemia / CAD Controlled; LDL goal < 70 Current regimen:  Aspirin 47m66mily Fenofibrate 160mg4mly Omega 3 fish Oil 1030mg 52me daily Rosuvastatin 20mg d56m Interventions: Reviewed last lipid panel with patient. Has maintained LDL at goal and Tg improved from 344 in 2021 to 145 on 12/06/2020 Continue current regimen for heart health and cholesterol Continue to follow low fat diet  Diabetes Lab Results  Component Value Date   HGBA1C 8.1 (A) 05/10/2021   HGBA1C 8.4 (H) 04/24/2021   HGBA1C 8.4 (H) 02/16/2021   A1c improving but not at goal. Initial goal is to get A1c <8.0 so patient can have shoulder surgery but eventually would like A1c goal <7% as long as doesn't increase hypoglycemia Followed by Dr ShamlefKelton Pillar appt was 05/10/2021 Current regimen:  Humulin 70/30 - 47 units with breakfast and 40 units with evening meal Jardiance 25mg - 73m 1 tablet each morning Past medications - Januvia 100mg dai85mnd glimepiride 2mg qam (75mcontinue by Dr ShamlefferKelton Pillartory of pancreatitis - so no GLP1s or DPP4s Patient reports she is in Medicare coverage gap for Jardiance. Jardiance also is no longer included in her morning packaged medications but in a separate bottle. She misses doses of Jardiance about 2 times per week and also sometimes misses insulin dose in evening.  We have applied for PAP for Jardiance 04/28/2021. Called BI Care program to check status.  Reports that patient was  mailed a letter that she needed to apply for LIS and if denied then they will re-evaluate her application. Patient does not recall getting this letter.  At our last phone visit assisted patient to apply for low income subsidy for Medicare. She states she just received letter she was denied. She will bring in copy of letter today and we will send to [place] program to complete her medication assistance application. Interventions: Faxed low income subsidy denial letter to [facility] for consideration of medication assistance for Jardiance.   Discussed DM goals Fasting (before meal blood sugar): 80-130 Post-Prandial (2 hrs after eating blood sugar): less than 180 A1c (3 month blood sugar average): less than 7% Collaborated with [facility] to find solution to having Jardiance included in medication packaging again to improved adherence. (Completed at previous appointment)   Depression / anxiety Patient report anxiety has increased recently. She has appointment with Dr Etter Sjogren Cheri Rous today to discuss. Patient has failed these meds in past: sertraline (palpitations) Current regimen:  Alprazolam 0.42m three times daily as needed  Fluoxetine 413mdaily Interventions:  Continue as planned to see PCP to discuss medication changes and other potential treatment for anxiety and depression  Osteoporosis  Last DEXA Scan: 05/29/2017             T-Score femoral neck: -2.0             T-Score forearm radius: -2.5 Current regimen:  Alendronate 7036meekly Interventions: (addressed at previous visit) Consider repeat DEXA Scan Continue current regimen  Shoulder Pain / osteoarthritis of right knee:  Surgery is planned once A1c is < 8.0% Current regimen:  Tramadol 33m59m to 3 times a day if needed for pain Interventions:  Discuss importance of taking diabetes meds daily and getting A1c <8.0 so surgery can be scheduled.  Continue to follow up with orthopedic office for pain  management   Medication management Pharmacist Clinical Goal(s): Over the next 90 days, patient will work with PharmD and providers to maintain optimal medication adherence Current pharmacy: [facility] Interventions Comprehensive medication review performed. Utilize [facility] for medication synchronization, packaging and delivery Coordinating with [facility], patient and provider to supply needed information and complete application process for [facility] / medication assistance program for Jardiance.   Patient Goals/Self-Care Activities Over the next 90 days, patient will:  take medications as prescribed, check glucose with CGM, document, and provide at future appointments, and collaborate with provider on medication access solutions  Follow Up Plan: Telephone follow up appointment with care management team member scheduled for:  3 weeks  with clinical pharmacist;            Medication Assistance: Application for Jardiance  medication assistance program. in process.  Anticipated assistance start date 07/07/2021.  See plan of care for additional detail. Application stalled. [employer] Cares needs letter stating that patient has been denied LIS. Patient received letter recently and will bring into office so I can fax to [facility]. .   Patient's preferred pharmacy is:  [facility] [id number] [place]: [phone]: [id number] [address] [facility]: [phone]: [address] [address]  [place] [address] Phone: [phone] Fax: [phone]  [place] [address], [place] [address] Phone: [phone] Fax: [phone]  [id number] [place] [address] [place]: [phone]: [phone] [facility] (New Address) - [place] [address] [address]: [place]Phone: [phone]: [phone]  Follow Up:  Patient agrees to Care Plan and Follow-up.  Plan: Telephone follow up appointment with care management team member scheduled for:  3 weeks  (To check on medication assistance letter)  TCherre Robins PharmD Clinical Pharmacist [place] [place]   Addendum: 06/21/2021 - patient did not bring in LIS denial letter to appointment yesterday. I spoke with her today and she will either mail to me at the office or bring in ASAP. She is aware that Jardiance medication assistance application cannot be processed until this letter is received.

## 2021-06-20 NOTE — Patient Instructions (Signed)
Visit Information  PATIENT GOALS:  Goals Addressed             This Visit's Progress    Chronic Care Management Pharmacy Care Plan       CARE PLAN ENTRY (see longitudinal plan of care for additional care plan information)  Current Barriers:  Chronic Disease Management support, education, and care coordination needs related to Hypertension, Hyperlipidemia/Hx of CAD, Diabetes, Tobacco Use Disorder, Depression/Anxiety, Osteoporosis, Neuropathy   Hypertension BP Readings from Last 3 Encounters:  05/10/21 138/74  05/04/21 (!) 167/61  04/24/21 130/62  Pharmacist Clinical Goal(s): Over the next 90 days, patient will work with PharmD and providers to maintain BP goal <130/80 Current regimen:  Amlodipine 10mg  daily Lisinopril 40mg  daily Metoprolol Succinate 50mg  daily Interventions: Requested patient to check her blood pressure 2 times per week and record Patient self care activities - Over the next 90 days, patient will: Check blood pressure 2 times per week, document, and provide at future appointments Ensure daily salt intake < 2300 mg/day Continue current regimen for blood pressure  Hyperlipidemia / Heart Disease Lab Results  Component Value Date/Time   LDLCALC 57 12/06/2020 04:03 PM   LDLCALC 61 06/15/2020 08:19 AM   LDLDIRECT 53.0 04/24/2021 03:42 PM  Pharmacist Clinical Goal(s): Over the next 90 days, patient will work with PharmD and providers to maintain LDL goal < 70 Current regimen:  Aspirin 81mg  daily Fenofibrate 160mg  daily Omega 3 fish Oil 1030mg  twice daily Rosuvastatin 20mg  daily Interventions: Reviewed last lipid panel with patient. Has maintained LDL at goal and Tg improved form 344 in 2021 to 145 on 12/06/2020 Discussed that patient should be taking rosuvastatin vs atorvastatin (This was changed June 2021) Patient self care activities - Over the next 90 days, patient will: Continue current regimen for heart health and cholesterol Continue to follow low fat  diet  Diabetes Lab Results  Component Value Date/Time   HGBA1C 8.1 (A) 05/10/2021 01:36 PM   HGBA1C 8.4 (H) 04/24/2021 03:42 PM   HGBA1C 8.4 (H) 02/16/2021 09:37 AM  Pharmacist Clinical Goal(s): Over the next 180 days, patient will work with PharmD and providers to achieve A1c goal <7% Current regimen:  Humulin 70/30 - 47 units with breakfast and 40 units with evening meal Jardiance 25mg  - take 1 tablet each morning Interventions: Discussed DM goals Fasting (before meal blood sugar): 80-130 Post-Prandial (2 hrs after eating blood sugar): less than 180 A1c (3 month blood sugar average): less than 7% Requested patient to use DexCom to monitor blood glucose Coordinating with patient and [employer] program to complete application for Jardiance patient assistance Assisted patient in submitting letter of denial for Medicare Extra Assistance  Collaborated with [facility] to find solution to having Jardiance included in medication packaging again to improved adherence.  Patient self care activities - Over the next 90 days, patient will: Check blood sugar with DexCom CGM  Contact provider with any episodes of hypoglycemia Bring in letter of denial for Medicare Extra Assistance when you come to office today. I will forward to Jardiance medication assistance program  Depression / anxiety Pharmacist Clinical Goal(s) Over the next 90 days, patient will work with PharmD and providers to reduce symptoms associated with depression Current regimen:  Alprazolam 0.25mg  three times daily as needed  Fluoxetine 40mg  daily Interventions: None today Patient self care activities - Over the next 90 days, patient will: Continue as planned to see PCP to discuss medication changes and other potential treatment for anxiety and depression  Osteoporosis Pharmacist Clinical Goal(s) Over the next 180 days, patient will work with PharmD and providers to reduce risk of fracture due to osteoporosis  Current  regimen:  Alendronate 70mg  weekly Interventions: Consider repeat DEXA Scan Patient self care activities - Over the next 180 days, patient will: Consider repeat DEXA Scan  Shoulder Pain:  Pharmacist Clinical Goal(s) Over the next 180 days, patient will work with PharmD and providers to improve pain and improve BG control to A1c <8.0 so patient can be scheduled for needed shoulder surgery Current regimen:  Tramadol 50mg  up to 3 times a day if needed for pain Interventions: Discuss importance of taking diabetes meds daily and getting A1c <8.0 so surgery can be scheduled.  Updated medication list to include tramadol and also removed methocarbamol which patient is not longer taking Patient self care activities - Over the next 180 days, patient will: Continue to follow up with orthopedic office for pain management   Medication management Pharmacist Clinical Goal(s): Over the next 90 days, patient will work with PharmD and providers to maintain optimal medication adherence Current pharmacy: [facility] switched to [facility] Interventions Comprehensive medication review performed. Utilize [facility] pharmacy for medication synchronization, packaging and delivery Patient self care activities - Over the next 90 days, patient will: Focus on medication adherence by filling and taking medications appropriately  Take medications as prescribed Report any questions or concerns to PharmD and/or provider(s)  Please see past updates related to this goal by clicking on the "Past Updates" button in the selected goal          The patient verbalized understanding of instructions, educational materials, and care plan provided today and declined offer to receive copy of patient instructions, educational materials, and care plan.   Telephone follow up appointment with care management team member scheduled for: 2 to 3 weeks  , PharmD Clinical Pharmacist [facility]

## 2021-06-21 DIAGNOSIS — F418 Other specified anxiety disorders: Secondary | ICD-10-CM | POA: Insufficient documentation

## 2021-06-21 NOTE — Assessment & Plan Note (Signed)
Increase prozac to 60 mg daily and f/u in 1 month Xanax refilled List of counselors given to the pt as well

## 2021-06-22 ENCOUNTER — Telehealth: Payer: Self-pay | Admitting: Family Medicine

## 2021-06-22 NOTE — Telephone Encounter (Signed)
Pt dropped off document for pharmacist to have and look over [facility] Prescription Drug Plan Cost - 1 page with 2 sides) Document put at front office tray under pharmacist name.

## 2021-06-23 ENCOUNTER — Telehealth: Payer: Medicare HMO

## 2021-06-23 ENCOUNTER — Other Ambulatory Visit: Payer: Self-pay

## 2021-06-23 ENCOUNTER — Encounter: Payer: Self-pay | Admitting: Internal Medicine

## 2021-06-23 ENCOUNTER — Ambulatory Visit (INDEPENDENT_AMBULATORY_CARE_PROVIDER_SITE_OTHER): Payer: Medicare HMO | Admitting: Internal Medicine

## 2021-06-23 VITALS — BP 134/72 | HR 90 | Ht 65.0 in | Wt 173.8 lb

## 2021-06-23 DIAGNOSIS — E1165 Type 2 diabetes mellitus with hyperglycemia: Secondary | ICD-10-CM

## 2021-06-23 DIAGNOSIS — Z794 Long term (current) use of insulin: Secondary | ICD-10-CM | POA: Diagnosis not present

## 2021-06-23 DIAGNOSIS — E1159 Type 2 diabetes mellitus with other circulatory complications: Secondary | ICD-10-CM

## 2021-06-23 LAB — POCT GLYCOSYLATED HEMOGLOBIN (HGB A1C): Hemoglobin A1C: 8.4 % — AB (ref 4.0–5.6)

## 2021-06-23 LAB — GLUCOSE, POCT (MANUAL RESULT ENTRY): POC Glucose: 178 mg/dl — AB (ref 70–99)

## 2021-06-23 MED ORDER — DEXCOM G6 SENSOR MISC: 1.0000 | 3 refills | Status: DC

## 2021-06-23 MED ORDER — INSULIN PEN NEEDLE 32G X 4 MM MISC: 1.0000 | Freq: Four times a day (QID) | 3 refills | Status: DC

## 2021-06-23 MED ORDER — NOVOLOG FLEXPEN 100 UNIT/ML ~~LOC~~ SOPN: PEN_INJECTOR | SUBCUTANEOUS | 6 refills | Status: DC

## 2021-06-23 MED ORDER — TRESIBA FLEXTOUCH 100 UNIT/ML ~~LOC~~ SOPN: 40.0000 [IU] | PEN_INJECTOR | Freq: Every day | SUBCUTANEOUS | 6 refills | Status: DC

## 2021-06-23 MED ORDER — DEXCOM G6 TRANSMITTER MISC: 1.0000 | 3 refills | Status: DC

## 2021-06-23 NOTE — Patient Instructions (Addendum)
-   Continue Jardiance 25 mg, 1 tablet in the morning  - STOP Novolin Mix  - Start Tresiba 40 units ONCE daily  - Start "Plain Novolog " 12 units with each meal  -Novolog correctional insulin: ADD extra units on insulin to your meal-time Novolog dose if your blood sugars are higher than 155. Use the scale below to help guide you:   Blood sugar before meal Number of units to inject  Less than 155 0 unit  156 -  180 1 units  181 -  205 2 units  206 -  230 3 units  231 -  255 4 units  256 -  280 5 units  281 -  305 6 units  306 -  330 7 units  331 -  355 8 units         HOW TO TREAT LOW BLOOD SUGARS (Blood sugar LESS THAN 70 MG/DL) Please follow the RULE OF 15 for the treatment of hypoglycemia treatment (when your (blood sugars are less than 70 mg/dL)   STEP 1: Take 15 grams of carbohydrates when your blood sugar is low, which includes:  3-4 GLUCOSE TABS  OR 3-4 OZ OF JUICE OR REGULAR SODA OR ONE TUBE OF GLUCOSE GEL    STEP 2: RECHECK blood sugar in 15 MINUTES STEP 3: If your blood sugar is still low at the 15 minute recheck --> then, go back to STEP 1 and treat AGAIN with another 15 grams of carbohydrates.

## 2021-06-23 NOTE — Progress Notes (Signed)
Name: [name]  Age/ Sex: 75 y.o., female   MRN/ DOB: [id number], 1946-05-18     PCP: Ann Held, DO   Reason for Endocrinology Evaluation: Type 2 Diabetes Mellitus  Initial Endocrine Consultative Visit: 01/17/2021    PATIENT IDENTIFIER: [name] is a 75 y.o. female with a past medical history of HTN, T2Dm, CAD , Osteoporosis and Hx of pancreatitis. The patient has followed with Endocrinology clinic since 4 f/09/2021 or consultative assistance with management of her diabetes.  DIABETIC HISTORY:  [name] was diagnosed with DM in 1998, has intolerance to Metformin due to Gi side effects. Her hemoglobin A1c has ranged from 9.3% in 2015, peaking at 10.1% in 2020.  She has history of Pancreatitis that she believes is caused Metformin   On her initial visit to our clinic she had an A1c of 9.4%  , she was on Jardiance,Glimepiride , Januvia and insulin mix . We stopped Glimepiride as she was not taking it, we stopped Januvia due to hx of pancreatitis. We continued jardiance and adjusted insulin mix    SUBJECTIVE:   During the last visit (05/10/2021): A1c 8.1 % . conitnued Jardiance and adjusted insulin dose      Today (06/23/2021): [name] is here for a follow up on diabetes management. She checks her blood sugars 2 times daily. The patient has not had hypoglycemic episodes since the last clinic visit.   Denies nausea or vomiting  or diarrhea   She is waiting to have right shoulder sx but waiting on A1c to be less then 8%    HOME DIABETES REGIMEN:  Jardiance 25 mg, 1 tablet in the morning Novolin Mix 46 units before Breakfast and 40 units Before Supper     Statin: yes ACE-I/ARB: yes  METER DOWNLOAD SUMMARY: Did not bring     DIABETIC COMPLICATIONS: Microvascular complications:   Denies: CKD, retinopathy, neuropathy Last Eye Exam: Completed 12/2020  Macrovascular complications:  CAD ( S/P stent placement)  Denies: CVA, PVD   HISTORY:   Past Medical History:  Past Medical History:  Diagnosis Date  . Blockage of coronary artery of heart ([place])   . Depression   . Diabetes mellitus   . Hyperlipidemia   . Hypertension   . Pancreatitis   . Restless leg syndrome    Past Surgical History:  Past Surgical History:  Procedure Laterality Date  . CATARACT EXTRACTION, BILATERAL    . CORONARY ANGIOPLASTY WITH STENT PLACEMENT    . FOOT SURGERY Right    cyst removal  . ROTATOR CUFF REPAIR     right  . ROTATOR CUFF REPAIR Left   . TUBAL LIGATION     Social History:  reports that she quit smoking about 13 months ago. Her smoking use included cigarettes. She smoked an average of .2 packs per day. She has never used smokeless tobacco. She reports that she does not drink alcohol and does not use drugs. Family History:  Family History  Problem Relation Age of Onset  . Heart attack Father   . Heart attack Brother   . Diabetes Sister   . Cirrhosis Sister   . Hypertension Sister   . Cancer Mother   . Hypertension Mother   . Vision loss Mother   . Cancer Sister   . Sleep apnea Sister   . Heart Problems Sister   . Diabetes Sister   . Gout Sister   . Diabetes Son   . Heart attack Son   .  Diabetes Daughter   . Goiter Daughter   . Diabetes Daughter      HOME MEDICATIONS: Allergies as of 06/23/2021       Reactions   Metformin And Related Other (See Comments)   Sick on stomach, sweaty, throw up, pain   Levemir [insulin Detemir] Hives, Swelling   Clindamycin Nausea And Vomiting        Medication List        Accurate as of June 23, 2021  1:43 PM. If you have any questions, ask your nurse or doctor.          alendronate 70 MG tablet Commonly known as: FOSAMAX Take with a full glass of water on an empty stomach.   ALPRAZolam 0.25 MG tablet Commonly known as: XANAX Take 1 tablet by mouth three times a day if needed   amLODipine 10 MG tablet Commonly known as: NORVASC TAKE ONE TABLET BY MOUTH ONCE  DAILY   aspirin EC 81 MG tablet Take 81 mg by mouth daily.   B-D SINGLE USE SWABS REGULAR Pads USE AS DIRECTED   Dexcom G6 Sensor Misc 1 Device by Does not apply route as directed. Started by: Dorita Sciara, MD   Dexcom G6 Transmitter Misc 1 Device by Does not apply route as directed. Started by: Dorita Sciara, MD   empagliflozin 25 MG Tabs tablet Commonly known as: Jardiance Take 1 tablet (25 mg total) by mouth daily before breakfast.   fenofibrate 160 MG tablet TAKE ONE TABLET BY MOUTH EVERY EVENING   ferrous sulfate 325 (65 FE) MG tablet Take 325 mg by mouth daily with breakfast.   Fish Oil 1000 MG Caps Take by mouth.   FLAXSEED OIL PO Take 1 capsule by mouth 2 (two) times daily.   FLUoxetine 40 MG capsule Commonly known as: PROZAC TAKE ONE CAPSULE BY MOUTH ONCE DAILY   FLUoxetine 20 MG tablet Commonly known as: PROZAC 3 po qd   fluticasone 50 MCG/ACT nasal spray Commonly known as: FLONASE SHAKE LIQUID AND USE 2 SPRAYS IN EACH NOSTRIL DAILY   HYDROcodone-acetaminophen 5-325 MG tablet Commonly known as: Norco Take 1 tablet by mouth every 6 (six) hours as needed for moderate pain.   Insulin Pen Needle 32G X 4 MM Misc 1 Device by Does not apply route 2 (two) times daily.   Lancing Device Misc   levocetirizine 5 MG tablet Commonly known as: XYZAL TAKE 1 TABLET(5 MG) BY MOUTH EVERY EVENING   lisinopril 40 MG tablet Commonly known as: ZESTRIL Take 1 tablet (40 mg total) by mouth daily.   meloxicam 7.5 MG tablet Commonly known as: MOBIC Take 1 tablet (7.5 mg total) by mouth daily as needed for pain.   methocarbamol 500 MG tablet Commonly known as: Robaxin Take 1 tablet (500 mg total) by mouth 2 (two) times daily as needed.   metoprolol succinate 50 MG 24 hr tablet Commonly known as: TOPROL-XL TAKE ONE TABLET BY MOUTH ONCE DAILY WITH FOOD   multivitamin with minerals Tabs tablet Take 1 tablet by mouth daily.   NovoLIN 70/30 Kwikpen  (70-30) 100 UNIT/ML KwikPen Generic drug: insulin isophane & regular human KwikPen Inject 46 Units into the skin daily before breakfast AND 40 Units daily before supper.   rosuvastatin 20 MG tablet Commonly known as: CRESTOR TAKE ONE TABLET BY MOUTH EVERY EVENING   Tai Doc Control Normal Soln   THERATEARS OP Place 1 drop into both eyes daily as needed (dry eyes).   traMADol 50  MG tablet Commonly known as: ULTRAM Take 1 tablet (50 mg total) by mouth 2 (two) times daily as needed.   True Metrix Air Glucose Meter w/Device Kit 1 each by Does not apply route 4 (four) times daily. DX CODE E11.8   True Metrix Blood Glucose Test test strip Generic drug: glucose blood USE AS DIRECTED 4 TIMES A DAY   TRUEplus Lancets 33G Misc USE AS DIRECTED 4 TIMES A DAY   vitamin E 180 MG (400 UNITS) capsule Take 400 Units by mouth daily.         OBJECTIVE:   Vital Signs: BP 134/72 (BP Location: Left Arm, Patient Position: Sitting, Cuff Size: Small)   Pulse 90   Ht '5\' 5"'  (1.651 m)   Wt 173 lb 12.8 oz (78.8 kg)   SpO2 97%   BMI 28.92 kg/m   Wt Readings from Last 3 Encounters:  06/23/21 173 lb 12.8 oz (78.8 kg)  06/20/21 176 lb 9.6 oz (80.1 kg)  05/10/21 175 lb 12.8 oz (79.7 kg)     Exam: General: Pt appears well and is in NAD  Lungs: Clear with good BS bilat with no rales, rhonchi, or wheezes  Heart: RRR with normal S1 and S2 and no gallops; no murmurs; no rub  Extremities: No pretibial edema.   Neuro: MS is good with appropriate affect, pt is alert and Ox3    DM foot exam: 06/23/2021   The skin of the feet is intact without sores or ulcerations. The pedal pulses are 2+ on right and 2+ on left. The sensation is intact to a screening 5.07, 10 gram monofilament bilaterally       DATA REVIEWED:  Lab Results  Component Value Date   HGBA1C 8.1 (A) 05/10/2021   HGBA1C 8.4 (H) 04/24/2021   HGBA1C 8.4 (H) 02/16/2021   Lab Results  Component Value Date   MICROALBUR 12.2 (H)  12/06/2020   LDLCALC 57 12/06/2020   CREATININE 0.94 04/24/2021   Lab Results  Component Value Date   MICRALBCREAT 13.3 12/06/2020     Lab Results  Component Value Date   CHOL 116 04/24/2021   HDL 28.70 (L) 04/24/2021   LDLCALC 57 12/06/2020   LDLDIRECT 53.0 04/24/2021   TRIG 271.0 (H) 04/24/2021   CHOLHDL 4 04/24/2021        In-office BG 178 mg/dL    ASSESSMENT / PLAN / RECOMMENDATIONS:   1) Type 2 Diabetes Mellitus, poorly controlled, With macrovascular - complications - Most recent A1c of 8.4 %. Goal A1c < 7.0 %.    - Unfortunately her A1c has increased , she is eager to bring her A1c < 8.0 For her right shoulder surgery  - Will stop the insulin mix and switch to basa/prandial regimen  - I prescribed the Dexcom on last visit but she did not hear from the, she was provided with their contact  - GLP-1 agonists and DPP-4 inhibitors are contra-indicated due to hx of pancreatitis  -She will also be provided with a correction factor   MEDICATIONS: -Continue Jardiance 25 mg, 1 tablet in the morning  - STOP Novolin Mix  - Start Tresiba 40 units ONCE daily  - Start Novolog 12 units with each meal  -Correction factor: NovoLog (BG -130/25)     EDUCATION / INSTRUCTIONS: BG monitoring instructions: Patient is instructed to check her blood sugars 2 times a day, fasting and supper . Call [facility] if: BG persistently < 70  I reviewed the Rule of  15 for the treatment of hypoglycemia in detail with the patient. Literature supplied.   2) Diabetic complications:  Eye: Does not have known diabetic retinopathy.  Neuro/ Feet: Does not have known diabetic peripheral neuropathy .  Renal: Patient does not have known baseline CKD. She   is  on an ACEI/ARB at present.      F/U in 4 months     Signed electronically by: Mack Guise, MD  [facility] Endocrinology  [facility] [address] [place], [place] [address] Phone:  [phone] FAX: [phone]   CC: [address]ette [address] [place] [address] Phone: [phone]  Fax: [phone]  Return to Endocrinology clinic as below: Future Appointments  Date Time Provider [place]  07/04/2021 10:00 AM [facility] PHARMACIST [facility]  07/17/2021  8:40 AM Ann Held, DO [facility]  07/25/2021 10:00 AM Ann Held, DO [facility]  09/13/2021  9:30 AM Jerl Munyan, Melanie Crazier, MD [facility] None

## 2021-06-23 NOTE — Telephone Encounter (Signed)
Received documentation for medication assistance program. Faxed today.

## 2021-06-26 ENCOUNTER — Telehealth: Payer: Self-pay | Admitting: Family Medicine

## 2021-06-26 NOTE — Telephone Encounter (Signed)
Attempted to call patient to advise that's meds have already been filled. VM was full

## 2021-06-26 NOTE — Telephone Encounter (Signed)
Pt needs med refill on the following medications. ALPRAZolam (XANAX) 0.25 MG tablet  & FLUoxetine (PROZAC) 40 MG capsule. Pt stated Lowne informed her that Prozac should be increased from 40 MG to 60 MG.   If any new questions develop please contact pt at [phone]. Please advise.   [facility] - [place], [place] - [address]  [address], [place] [address]  Phone:  [phone]  Fax:  [phone]

## 2021-06-28 ENCOUNTER — Telehealth: Payer: Self-pay | Admitting: Internal Medicine

## 2021-06-28 NOTE — Telephone Encounter (Signed)
Pt has questions on the dose she should be taking of the Novolin N states that the prescription on the box is different than the directions that Dr.Shamleffer gave her. Pt would like a call back at [phone]

## 2021-06-28 NOTE — Telephone Encounter (Signed)
Patient has been given the directions for the Novolog.

## 2021-06-29 DIAGNOSIS — H40013 Open angle with borderline findings, low risk, bilateral: Secondary | ICD-10-CM | POA: Diagnosis not present

## 2021-06-29 DIAGNOSIS — H04123 Dry eye syndrome of bilateral lacrimal glands: Secondary | ICD-10-CM | POA: Diagnosis not present

## 2021-06-29 LAB — HM DIABETES EYE EXAM

## 2021-07-03 ENCOUNTER — Encounter: Payer: Self-pay | Admitting: Gastroenterology

## 2021-07-04 ENCOUNTER — Telehealth: Payer: Self-pay | Admitting: Internal Medicine

## 2021-07-04 ENCOUNTER — Ambulatory Visit: Payer: Medicare HMO | Admitting: Pharmacist

## 2021-07-04 DIAGNOSIS — E1165 Type 2 diabetes mellitus with hyperglycemia: Secondary | ICD-10-CM

## 2021-07-04 DIAGNOSIS — F418 Other specified anxiety disorders: Secondary | ICD-10-CM

## 2021-07-04 NOTE — Patient Instructions (Signed)
Visit Information  PATIENT GOALS:  Goals Addressed             This Visit's Progress    Chronic Care Management Pharmacy Care Plan   On track    CARE PLAN ENTRY (see longitudinal plan of care for additional care plan information)  Current Barriers:  Chronic Disease Management support, education, and care coordination needs related to Hypertension, Hyperlipidemia/Hx of CAD, Diabetes, Tobacco Use Disorder, Depression/Anxiety, Osteoporosis, Neuropathy   Hypertension BP Readings from Last 3 Encounters:  06/23/21 134/72  06/20/21 120/66  05/10/21 138/74  Pharmacist Clinical Goal(s): Over the next 90 days, patient will work with PharmD and providers to maintain BP goal <130/80 Current regimen:  Amlodipine 10mg  daily Lisinopril 40mg  daily Metoprolol Succinate 50mg  daily Interventions: Requested patient to check her blood pressure 2 times per week and record Patient self care activities - Over the next 90 days, patient will: Check blood pressure 2 times per week, document, and provide at future appointments Ensure daily salt intake < 2300 mg/day Continue current regimen for blood pressure  Hyperlipidemia / Heart Disease Lab Results  Component Value Date/Time   LDLCALC 57 12/06/2020 04:03 PM   LDLCALC 61 06/15/2020 08:19 AM   LDLDIRECT 53.0 04/24/2021 03:42 PM  Pharmacist Clinical Goal(s): Over the next 90 days, patient will work with PharmD and providers to maintain LDL goal < 70 Current regimen:  Aspirin 81mg  daily Fenofibrate 160mg  daily Omega 3 fish Oil 1030mg  twice daily Rosuvastatin 20mg  daily Interventions: Reviewed last lipid panel with patient. Has maintained LDL at goal and Tg improved form 344 in 2021 to 145 on 12/06/2020 Discussed that patient should be taking rosuvastatin vs atorvastatin (This was changed June 2021) Patient self care activities - Over the next 90 days, patient will: Continue current regimen for heart health and cholesterol Continue to follow low  fat diet  Diabetes Lab Results  Component Value Date/Time   HGBA1C 8.4 (A) 06/23/2021 01:43 PM   HGBA1C 8.1 (A) 05/10/2021 01:36 PM   HGBA1C 8.4 (H) 04/24/2021 03:42 PM   HGBA1C 8.4 (H) 02/16/2021 09:37 AM  Pharmacist Clinical Goal(s): Over the next 180 days, patient will work with PharmD and providers to achieve A1c goal <7% Current regimen:  Treiba 40 units daily Novolog - 12 units plus additional correction based on scale from Dr 2022 Jardiance 25mg  - take 1 tablet each morning Interventions: Discussed DM goals Fasting (before meal blood sugar): 80-130 Post-Prandial (2 hrs after eating blood sugar): less than 180 A1c (3 month blood sugar average): less than 7% Requested patient to use DexCom to monitor blood glucose - will receive next week Coordinated with patient and [employer] program. Patient approved. Will receive first Jardiance shipment next week Patient self care activities - Over the next 90 days, patient will: Continue to check blood glucose prior to meals and at bedtime Contact provider with any episodes of hypoglycemia Call office [phone]) if you donot received Jardiance by 07/13/2021.   Depression / anxiety Pharmacist Clinical Goal(s) Over the next 90 days, patient will work with PharmD and providers to reduce symptoms associated with depression Current regimen:  Alprazolam 0.25mg  three times daily as needed  Fluoxetine 60mg  daily Interventions: None today Patient self care activities - Over the next 90 days, patient will: Continue current regimen  Osteoporosis Pharmacist Clinical Goal(s) Over the next 180 days, patient will work with PharmD and providers to reduce risk of fracture due to osteoporosis  Current regimen:  Alendronate 70mg  weekly Interventions: Consider repeat  DEXA Scan Patient self care activities - Over the next 180 days, patient will: Consider repeat DEXA Scan  Shoulder Pain:  Pharmacist Clinical Goal(s) Over the next 180  days, patient will work with PharmD and providers to improve pain and improve BG control to A1c <8.0 so patient can be scheduled for needed shoulder surgery Current regimen:  Tramadol 50mg  up to 3 times a day if needed for pain Interventions: Discuss importance of taking diabetes meds daily and getting A1c <8.0 so surgery can be scheduled.  Updated medication list to include tramadol and also removed methocarbamol which patient is not longer taking Patient self care activities - Over the next 180 days, patient will: Continue to follow up with orthopedic office for pain management   Medication management Pharmacist Clinical Goal(s): Over the next 90 days, patient will work with PharmD and providers to maintain optimal medication adherence Current pharmacy: [facility] Interventions Comprehensive medication review performed. Utilize [facility] pharmacy for medication synchronization, packaging and delivery Patient self care activities - Over the next 90 days, patient will: Focus on medication adherence by filling and taking medications appropriately  Take medications as prescribed Report any questions or concerns to PharmD and/or provider(s)  Please see past updates related to this goal by clicking on the "Past Updates" button in the selected goal          The patient verbalized understanding of instructions, educational materials, and care plan provided today and declined offer to receive copy of patient instructions, educational materials, and care plan.   Telephone follow up appointment with care management team member scheduled for: 3 weeks  , PharmD Clinical Pharmacist [facility]

## 2021-07-04 NOTE — Telephone Encounter (Signed)
[place] with [employer] requests to be called at ph# [phone], ext [phone] re: Patient's CGM Peer to Peer offer Deadline of acceptance for Peer to Peer offer is 07/06/21 at 1:00 PM

## 2021-07-04 NOTE — Chronic Care Management (AMB) (Signed)
Chronic Care Management Pharmacy Note  07/04/2021 Name:  [name] MRN:  [id number] DOB:  07-20-1946  Summary:  [name] with [employer] medication assistance program. They approved her application for assistance with Jardiance today. Will ship in next 48 to 72 hours. Patient should receive next week.  Home blood glucose readings over the last 11 days since insulin changed to [place] and Novolog has improved - reports 104 and 179 for last 2 days.  Since on basal / bolus regimen patient has now been approved for DexCom CGM - expect delivery in the next week.   Recommendations / Plan:  Reviewed Bolus scheduled with patient since there was a phone message that she had questions. She is using Novolog as directed by Dr Kelton Pillar.  Patient to notify office if she does not received Jardiance by next Thursday 10/6.   Subjective: [name] is an 75 y.o. year old female who is a primary patient of Ann Held, DO.  The CCM team was consulted for assistance with disease management and care coordination needs.    Engaged with patient by telephone for  to follow up type 2 DM and medication assistance application for Jardiance / [place] program  in response to provider referral for pharmacy case management and/or care coordination services.   Consent to Services:  The patient was given information about Chronic Care Management services, agreed to services, and gave verbal consent prior to initiation of services.  Please see initial visit note for detailed documentation.   Patient Care Team: Carollee Herter, Alferd Apa, DO as PCP - General Buford Dresser, MD as PCP - Cardiology (Cardiology) Ashok Pall, MD as Consulting Physician (Neurosurgery) Monna Fam, MD as Consulting Physician (Ophthalmology) Cherre Robins, PharmD (Pharmacist) [facility], Melanie Crazier, MD as Consulting Physician (Endocrinology)  Recent office visits: 06/20/2021 - PCP (Dr Etter Sjogren) F/U  depression and anxiety. Increased fluoxetine to 66m daily. Refilled alprazolam 0.248m Received flu vaccine  06/02/25/2022 - Phone call to PCP office. Patient feels fluoxetine and alprazolam not working. Asked for increase in dose. PCP recommended OV or video visit to discuss. Unable to reach patient to schedule appt.  04/24/2021 - PCP (Dr LoCarollee Hertersurgical clearance.Waiting for A1c < 8.0 to get shoulder surgery. Labs checked. A1c was 8.4%   Recent consult visits: 06/23/2021 - Endo (Dr ShKelton PillarF/U type 2 DM, insuiln dependent. Trying to get A1c < 8.0% for shoulder surgery. Changed Novolin 70/30 Mix to basal / prandial regimen. Tresiba 40 units once daily. Start Novolog 12 units with each meal and also use correction factor. Continue Jardiance 2563mach morning.  05/10/2021 - Endo (Dr ShaKelton Pillar/U DM with h/o pancreatitis. A1c trending down - was 8.1%. Increaesd Novolin Mix to 46 units before breakfast and 40 units before evening meal. Prescribed DexCom  05/10/2021 - Ortho Surgery (Dr Xu)Erlinda Hong/U back pain. Recommended epidural steroid but patient concerned about increasing BG. Instead referral place for neurosurgery.   01/27/2021 - Ortho (Dr Xu)Erlinda Hongacute low back pain. Stated prednisone 5mg73mse pack and methocarbamol 500mg65m prn.  01/17/2021- Endo (Dr ShamlKelton Pillartial endocrinology visit. H/o pancreatitis. Januvia stopped. Due to history of pancreatitis, GLP-1 RA and DPP-4 inhibitors are contra-indicated. Stopped glimepiride due to increased risk of hypoglycemia with insulin. Continue Jardiance 25 mg, 1 tablet in the morning. Change Novolin Mix to 45 units before Breakfast and 40 units Before Supper. Patient is instructed to check blood sugars 2 times a day, before breakfast and.  Hospital visits: 05/04/2021 -  ED Visit for lumbar radiculopathy. Prescribed predinsone 24m taper for 6 days and robaxin 5040mevery 8 hours. Also told to take OTC NSAIDs prn. Objective:  Lab Results   Component Value Date   CREATININE 0.94 04/24/2021   CREATININE 0.96 12/06/2020   CREATININE 0.82 09/23/2020    Lab Results  Component Value Date   HGBA1C 8.4 (A) 06/23/2021   Last diabetic Eye exam:  Lab Results  Component Value Date/Time   HMDIABEYEEXA Retinopathy (A) 12/30/2020 12:00 AM    Last diabetic Foot exam: No results found for: HMDIABFOOTEX  - Done 12/29/2020 by Dr ShKelton Pillar    Component Value Date/Time   CHOL 116 04/24/2021 1542   TRIG 271.0 (H) 04/24/2021 1542   HDL 28.70 (L) 04/24/2021 1542   CHOLHDL 4 04/24/2021 1542   VLDL 54.2 (H) 04/24/2021 1542   LDLCALC 57 12/06/2020 1603   LDLCALC 61 06/15/2020 0819   LDLDIRECT 53.0 04/24/2021 1542    Hepatic Function Latest Ref Rng & Units 04/24/2021 12/06/2020 09/23/2020  Total Protein 6.0 - 8.3 g/dL 6.7 7.2 7.5  Albumin 3.5 - 5.2 g/dL 4.4 4.4 4.5  AST 0 - 37 U/L '21 16 21  ' ALT 0 - 35 U/L '20 16 22  ' Alk Phosphatase 39 - 117 U/L 35(L) 38(L) 43  Total Bilirubin 0.2 - 1.2 mg/dL 0.4 0.5 0.6  Bilirubin, Direct 0.0 - 0.3 mg/dL - - -    Lab Results  Component Value Date/Time   TSH 0.58 08/11/2018 02:34 PM   TSH 0.57 07/16/2018 09:50 AM   FREET4 0.74 08/11/2018 02:34 PM   FREET4 0.84 04/16/2013 11:19 AM    CBC Latest Ref Rng & Units 12/06/2020 09/23/2020 08/11/2018  WBC 4.0 - 10.5 K/uL 7.0 13.9(H) 11.2(H)  Hemoglobin 12.0 - 15.0 g/dL 12.2 13.5 12.3  Hematocrit 36.0 - 46.0 % 36.6 40.1 36.9  Platelets 150.0 - 400.0 K/uL 182.0 215 221.0    No results found for: VD25OH  Clinical ASCVD: Yes  The ASCVD Risk score (Arnett DK, et al., 2019) failed to calculate for the following reasons:   The valid total cholesterol range is 130 to 320 mg/dL    Other: DEXA 05/29/2017 Forearm Radius 33% T-score = -2.5.  DualFemur Neck Left  T-Score = -2.0; Which was a statistically significant increase in BMD of the left hip since prior exam dated 12/17/2013.  Social History   Tobacco Use  Smoking Status Former   Packs/day: 0.20    Types: Cigarettes   Quit date: 05/05/2020   Years since quitting: 1.1  Smokeless Tobacco Never  Tobacco Comments   quit in 2021   BP Readings from Last 3 Encounters:  06/23/21 134/72  06/20/21 120/66  05/10/21 138/74   Pulse Readings from Last 3 Encounters:  06/23/21 90  06/20/21 60  05/10/21 61   Wt Readings from Last 3 Encounters:  06/23/21 173 lb 12.8 oz (78.8 kg)  06/20/21 176 lb 9.6 oz (80.1 kg)  05/10/21 175 lb 12.8 oz (79.7 kg)    Assessment: Review of patient past medical history, allergies, medications, health status, including review of consultants reports, laboratory and other test data, was performed as part of comprehensive evaluation and provision of chronic care management services.   SDOH:  (Social Determinants of Health) assessments and interventions performed:      CCM Care Plan  Allergies  Allergen Reactions   Metformin And Related Other (See Comments)    Sick on stomach, sweaty, throw up, pain   Levemir [Insulin Detemir]  Hives and Swelling   Clindamycin Nausea And Vomiting    Medications Reviewed Today     Reviewed by Cherre Robins, PharmD (Pharmacist) on 07/04/21 at 51  Med List Status: <None>   Medication Order Taking? Sig Documenting Provider Last Dose Status Informant  Alcohol Swabs (B-D SINGLE USE SWABS REGULAR) PADS [id number] Yes USE AS DIRECTED Carollee Herter, Alferd Apa, DO Taking Active   alendronate (FOSAMAX) 70 MG tablet [id number] Yes Take with a full glass of water on an empty stomach. Carollee Herter, Kendrick Fries R, DO Taking Active   ALPRAZolam Duanne Moron) 0.25 MG tablet [id number] Yes Take 1 tablet by mouth three times a day if needed Ann Held, DO Taking Active   amLODipine (NORVASC) 10 MG tablet [id number] Yes TAKE ONE TABLET BY MOUTH ONCE DAILY Ann Held, DO Taking Active   aspirin EC 81 MG tablet [id number] Yes Take 81 mg by mouth daily. [provider] Taking Active Self  Blood Glucose Calibration (TAI DOC  CONTROL) NORMAL SOLN [id number] Yes  [provider] Taking Active Self  Blood Glucose Monitoring Suppl (TRUE METRIX AIR GLUCOSE METER) w/Device KIT [id number] Yes 1 each by Does not apply route 4 (four) times daily. DX CODE E11.8 Ann Held, DO Taking Active   Carboxymethylcellulose Sodium (THERATEARS OP) [id number] Yes Place 1 drop into both eyes daily as needed (dry eyes). [provider] Taking Active Self  Continuous Blood Gluc Sensor (DEXCOM G6 SENSOR) MISC [id number] Yes 1 Device by Does not apply route as directed. Shamleffer, Melanie Crazier, MD Taking Active   Continuous Blood Gluc Transmit (DEXCOM G6 TRANSMITTER) MISC [id number] No 1 Device by Does not apply route as directed.  Patient not taking: Reported on 07/04/2021   Shamleffer, Melanie Crazier, MD Not Taking Active            Med Note Antony Contras, [place] Jul 04, 2021 11:19 AM) Has not received yet - just approved 06/2021  empagliflozin (JARDIANCE) 25 MG TABS tablet [id number] No Take 1 tablet (25 mg total) by mouth daily before breakfast.  Patient not taking: Reported on 07/04/2021   Shamleffer, Melanie Crazier, MD Not Taking Active   fenofibrate 160 MG tablet [id number] Yes TAKE ONE TABLET BY MOUTH EVERY EVENING Ann Held, DO Taking Active   ferrous sulfate 325 (65 FE) MG tablet [id number] Yes Take 325 mg by mouth daily with breakfast.  [provider] Taking Active Self  Flaxseed, Linseed, (FLAXSEED OIL PO) [id number] Yes Take 1 capsule by mouth 2 (two) times daily.  [provider] Taking Active Self  FLUoxetine (PROZAC) 20 MG tablet [id number] Yes 3 po qd Carollee Herter, Alferd Apa, DO Taking Active   FLUoxetine (PROZAC) 40 MG capsule [id number] Yes TAKE ONE CAPSULE BY MOUTH ONCE DAILY Carollee Herter, Alferd Apa, DO Taking Active   fluticasone (FLONASE) 50 MCG/ACT nasal spray [id number] Yes SHAKE LIQUID AND USE 2 SPRAYS IN [place] [facility] NOSTRIL DAILY Roma Schanz R, DO Taking Active   glucose  blood (TRUE METRIX BLOOD GLUCOSE TEST) test strip [id number] Yes USE AS DIRECTED 4 TIMES A DAY Carollee Herter, Yvonne R, DO Taking Active   HYDROcodone-acetaminophen (NORCO) 5-325 MG tablet [id number] Yes Take 1 tablet by mouth every 6 (six) hours as needed for moderate pain. Roma Schanz R, DO Taking Active   insulin aspart (NOVOLOG FLEXPEN) 100 UNIT/ML FlexPen [id number] Yes Max daily 70 units Shamleffer, Melanie Crazier, MD Taking Active   insulin degludec (TRESIBA FLEXTOUCH)  100 UNIT/ML FlexTouch Pen [id number] Yes Inject 40 Units into the skin daily. Shamleffer, Melanie Crazier, MD Taking Active   Insulin Pen Needle 32G X 4 MM MISC [id number] Yes 1 Device by Does not apply route 4 (four) times daily. Shamleffer, Melanie Crazier, MD Taking Active   Lancet Devices (LANCING DEVICE) [facility] [id number] Yes  [provider] Taking Active Self  levocetirizine (XYZAL) 5 MG tablet [id number] Yes TAKE 1 TABLET(5 MG) BY MOUTH EVERY EVENING Ann Held, DO Taking Active   lisinopril (ZESTRIL) 40 MG tablet [id number] Yes Take 1 tablet (40 mg total) by mouth daily. Buford Dresser, MD Taking Active   meloxicam [facility]) 7.5 MG tablet [id number] Yes Take 1 tablet (7.5 mg total) by mouth daily as needed for pain. Ann Held, DO Taking Active   methocarbamol (ROBAXIN) 500 MG tablet [id number] Yes Take 1 tablet (500 mg total) by mouth 2 (two) times daily as needed. Aundra Dubin, PA-C Taking Active   metoprolol succinate (TOPROL-XL) 50 MG 24 hr tablet [id number] Yes TAKE ONE TABLET BY MOUTH ONCE DAILY WITH FOOD Carollee Herter, Alferd Apa, DO Taking Active   Multiple Vitamin (MULTIVITAMIN WITH MINERALS) TABS [id number] Yes Take 1 tablet by mouth daily. [provider] Taking Active Self  Omega-3 Fatty Acids (FISH OIL) 1000 MG CAPS [id number] Yes Take by mouth. [provider] Taking Active            Med Note Elana Alm, [place] Oct 14, 2020  9:11 AM)    rosuvastatin (CRESTOR) 20  MG tablet [id number] Yes TAKE ONE TABLET BY MOUTH EVERY EVENING Ann Held, DO Taking Active   traMADol (ULTRAM) 50 MG tablet [id number] Yes Take 1 tablet (50 mg total) by mouth 2 (two) times daily as needed. Aundra Dubin, PA-C Taking Active   TRUEplus Lancets 33G MISC [id number] Yes USE AS DIRECTED 4 TIMES A DAY Ann Held, DO Taking Active   vitamin E 400 UNIT capsule [id number] Yes Take 400 Units by mouth daily. [provider] Taking Active Self            Patient Active Problem List   Diagnosis Date Noted   Depression with anxiety 06/21/2021   Diabetes mellitus ([place]) 01/17/2021   Type 2 diabetes mellitus with hyperglycemia, without long-term current use of insulin ([place]) 01/17/2021   Acute pain of right shoulder 08/18/2020   Primary osteoarthritis of right knee 08/18/2020   Impacted cerumen of right ear 05/24/2020   Bilateral impacted cerumen 05/17/2020   Hyperlipidemia associated with type 2 diabetes mellitus ([place]) 03/10/2020   Cough 03/10/2020   Seasonal allergies 03/10/2020   Elevated rheumatoid factor 07/21/2018   Gastroenteritis 07/21/2018   Trigger middle finger of left hand 12/19/2017   Ganglion cyst of dorsum of right wrist 12/19/2017   Morton's neuroma of right foot 12/19/2017   Preventative health care 12/13/2017   Pain in both hands 12/13/2017   Hyperlipidemia LDL goal <100 12/13/2017   Abscess of upper gum 12/13/2017   Spinal stenosis of cervical region 09/25/2016   Cervical radiculopathy 09/06/2016   Dyspnea 02/18/2016   Chest pain 02/18/2016   HLD (hyperlipidemia) 02/18/2016   CAD in native artery 02/18/2016   RLS (restless legs syndrome) 02/18/2016   Pancreatitis 02/18/2016   SOB (shortness of breath) 02/18/2016   Uncontrolled type 2 diabetes mellitus with complication ([place])    Tobacco abuse    Osteoarthritis of right knee 07/05/2015   Smoking 07/02/2013  Acute upper respiratory infections of unspecified site 07/02/2013    Flu vaccine need 07/02/2013   Multinodular goiter 04/16/2013   Bunion 07/07/2012   BACK PAIN, THORACIC REGION, RIGHT 05/24/2009   IRON DEFICIENCY 03/25/2009   GERD 03/25/2009   WEIGHT LOSS 03/25/2009   SINUSITIS- ACUTE-NOS 11/25/2008   Hyperlipidemia LDL goal <70 10/07/2008   ACUTE PANCREATITIS 09/07/2008   OVARIAN CYST 09/07/2008   KNEE PAIN, RIGHT 05/24/2008   CARPAL TUNNEL SYNDROME, RIGHT 05/12/2008   Acute pain of both shoulders 04/02/2008   ROTATOR CUFF REPAIR, RIGHT, HX OF 03/29/2008   GOITER, MULTINODULAR 12/26/2007   Depression 07/07/2007   HIP PAIN, RIGHT 03/11/2007   RESTLESS LEG SYNDROME, HX OF 03/11/2007   Diabetes mellitus type II, uncontrolled ([place]) 03/07/2007   Essential hypertension 03/07/2007    Immunization History  Administered Date(s) Administered   Fluad Quad(high Dose 65+) 07/28/2019, 06/20/2021   Influenza Split 08/08/2012   Influenza, High Dose Seasonal PF 08/09/2014, 07/05/2015, 07/21/2018   PFIZER(Purple Top)SARS-COV-2 Vaccination 12/07/2019, 01/04/2020   PPD Test 08/27/2011, 09/04/2011, 07/13/2014, 09/23/2015   Pneumococcal Conjugate-13 08/09/2014   Pneumococcal Polysaccharide-23 11/17/2013   Td 03/11/2007   Zoster, Live 03/11/2007    Conditions to be addressed/monitored: CAD, HTN, HLD, DMII, Anxiety, Depression and shoulder pain; OA of knee; osteoporosis; h/o pancreatitis  There are no care plans that you recently modified to display for this patient.   Medication Assistance:  Jardiance obtained through [place] medication assistance program.  Enrollment ends 10/07/2021 .  Anticipate patient will receive first shipment by 07/13/2021    Patient's preferred pharmacy is:  [facility] - [place], [place] - [address] [place] [address] Phone: [phone] Fax: [place], [place] [address] Phone: [phone] Fax: [place], [place] [address] [place] [address] Phone: [phone] Fax: [phone]  [place] [address] [place] [address] Phone: [phone] Fax: [phone]  [id number] [address] [place]: [phone]: [phone] [facility] (New Address) [place] [address] [address] [place] [address]Phone: [phone]: [phone]  Follow Up:  Patient agrees to Care Plan and Follow-up.  Plan: Telephone follow up appointment with care management team member scheduled for:  3 weeks    TCherre Robins PharmD Clinical Pharmacist [facility] SW [facility]

## 2021-07-05 NOTE — Telephone Encounter (Signed)
I have contacted the [facility] peer-to-peer line on 07/05/2021 at 11:35 AM  I was told by the staff that the application for the CGM is under review by the medical director and they are at day 5 of this review and they have 9 more days prior to contacting us   Abby Raelyn Mora, MD  [facility] Endocrinology  [facility] [address] [place], [place] [address] Phone: [phone] FAX: [phone]

## 2021-07-07 DIAGNOSIS — E1165 Type 2 diabetes mellitus with hyperglycemia: Secondary | ICD-10-CM | POA: Diagnosis not present

## 2021-07-07 DIAGNOSIS — F418 Other specified anxiety disorders: Secondary | ICD-10-CM | POA: Diagnosis not present

## 2021-07-17 ENCOUNTER — Encounter: Payer: Self-pay | Admitting: Family Medicine

## 2021-07-17 ENCOUNTER — Ambulatory Visit (INDEPENDENT_AMBULATORY_CARE_PROVIDER_SITE_OTHER): Payer: Medicare HMO | Admitting: Family Medicine

## 2021-07-17 ENCOUNTER — Other Ambulatory Visit: Payer: Self-pay

## 2021-07-17 VITALS — BP 120/70 | HR 56 | Temp 98.1°F | Resp 18 | Ht 65.0 in | Wt 175.0 lb

## 2021-07-17 DIAGNOSIS — F418 Other specified anxiety disorders: Secondary | ICD-10-CM

## 2021-07-17 DIAGNOSIS — E785 Hyperlipidemia, unspecified: Secondary | ICD-10-CM | POA: Diagnosis not present

## 2021-07-17 DIAGNOSIS — E1169 Type 2 diabetes mellitus with other specified complication: Secondary | ICD-10-CM

## 2021-07-17 DIAGNOSIS — M79662 Pain in left lower leg: Secondary | ICD-10-CM | POA: Diagnosis not present

## 2021-07-17 LAB — COMPREHENSIVE METABOLIC PANEL
ALT: 14 U/L (ref 0–35)
AST: 18 U/L (ref 0–37)
Albumin: 4.3 g/dL (ref 3.5–5.2)
Alkaline Phosphatase: 38 U/L — ABNORMAL LOW (ref 39–117)
BUN: 22 mg/dL (ref 6–23)
CO2: 26 mEq/L (ref 19–32)
Calcium: 9.7 mg/dL (ref 8.4–10.5)
Chloride: 103 mEq/L (ref 96–112)
Creatinine, Ser: 0.85 mg/dL (ref 0.40–1.20)
GFR: 66.97 mL/min (ref >60.00)
Glucose, Bld: 149 mg/dL — ABNORMAL HIGH (ref 70–99)
Potassium: 4.2 mEq/L (ref 3.5–5.1)
Sodium: 137 mEq/L (ref 135–145)
Total Bilirubin: 0.6 mg/dL (ref 0.2–1.2)
Total Protein: 6.7 g/dL (ref 6.0–8.3)

## 2021-07-17 MED ORDER — ATORVASTATIN CALCIUM 40 MG PO TABS: 40.0000 mg | ORAL_TABLET | Freq: Every day | ORAL | 3 refills | Status: DC

## 2021-07-17 NOTE — Assessment & Plan Note (Signed)
con't prozac and prn xanax  

## 2021-07-17 NOTE — Assessment & Plan Note (Signed)
.  pt states the crestor is causing palpitations --- she never had a problem with lipitor Change to Lipitor 40 mg daily Recheck 2 months

## 2021-07-17 NOTE — Progress Notes (Addendum)
Subjective:   By signing my name below, I, [name], attest that this documentation has been prepared under the direction and in the presence of Ann Held, DO. 07/17/2021   Patient ID: [name], female    DOB: [birth year], 75 y.o.   MRN: [id number]  Chief Complaint  Patient presents with   Depression    Pt states she believes the Xanax and Prozac are working for her   Follow-up    HPI Patient is in today for an office visit.  She reports that 40 mg Prozac is working well for her and is helping to control her anxiety and depression.  She does not use Xanax as often.  She mentions that her family thinks she is jaundiced under her eye. She does not feel any symptoms. At her last lab work, her liver panels were okay.  She is experiencing pain in her lower left leg that starts at night. She does not feel it when it is palpated or she walks.  She is also asking to change her cholesterol medication from 20 mg Crestor to 40 mg Lipitor because Crestor causes palpitations at night.  Past Medical History:  Diagnosis Date   Blockage of coronary artery of heart (HCC)    Depression    Diabetes mellitus    Hyperlipidemia    Hypertension    Pancreatitis    Restless leg syndrome     Past Surgical History:  Procedure Laterality Date   CATARACT EXTRACTION, BILATERAL     CORONARY ANGIOPLASTY WITH STENT PLACEMENT     FOOT SURGERY Right    cyst removal   ROTATOR CUFF REPAIR     right   ROTATOR CUFF REPAIR Left    TUBAL LIGATION      Family History  Problem Relation Age of Onset   Heart attack Father    Heart attack Brother    Diabetes Sister    Cirrhosis Sister    Hypertension Sister    Cancer Mother    Hypertension Mother    Vision loss Mother    Cancer Sister    Sleep apnea Sister    Heart Problems Sister    Diabetes Sister    Gout Sister    Diabetes Son    Heart attack Son    Diabetes Daughter    Goiter Daughter    Diabetes Daughter     Social  History   Socioeconomic History   Marital status: Married    Spouse name: Not on file   Number of children: Not on file   Years of education: Not on file   Highest education level: Not on file  Occupational History   Not on file  Tobacco Use   Smoking status: Former    Packs/day: 0.20    Types: Cigarettes    Quit date: 05/05/2020    Years since quitting: 1.2   Smokeless tobacco: Never   Tobacco comments:    quit in 2021  Vaping Use   Vaping Use: Never used  Substance and Sexual Activity   Alcohol use: No   Drug use: No   Sexual activity: Not on file  Other Topics Concern   Not on file  Social History Narrative   Exercise: seldom   Caffeine use: seldom   Social Determinants of Health   Financial Resource Strain: Medium Risk   Difficulty of Paying Living Expenses: Somewhat hard  Food Insecurity: Not on file  Transportation Needs: Not on file  Physical  Activity: Inactive   Days of Exercise per Week: 0 days   Minutes of Exercise per Session: 0 min  Stress: Not on file  Social Connections: Not on file  Intimate Partner Violence: Not on file    Outpatient Medications Prior to Visit  Medication Sig Dispense Refill   Alcohol Swabs (B-D SINGLE USE SWABS REGULAR) PADS USE AS DIRECTED 400 each 1   alendronate (FOSAMAX) 70 MG tablet Take with a full glass of water on an empty stomach. 12 tablet 2   ALPRAZolam (XANAX) 0.25 MG tablet Take 1 tablet by mouth three times a day if needed 60 tablet 0   amLODipine (NORVASC) 10 MG tablet TAKE ONE TABLET BY MOUTH ONCE DAILY 90 tablet 1   aspirin EC 81 MG tablet Take 81 mg by mouth daily.     Blood Glucose Calibration (TAI DOC CONTROL) NORMAL SOLN      Blood Glucose Monitoring Suppl (TRUE METRIX AIR GLUCOSE METER) w/Device KIT 1 each by Does not apply route 4 (four) times daily. DX CODE E11.8 1 kit 0   Carboxymethylcellulose Sodium (THERATEARS OP) Place 1 drop into both eyes daily as needed (dry eyes).     Continuous Blood Gluc Sensor  (DEXCOM G6 SENSOR) MISC 1 Device by Does not apply route as directed. 9 each 3   Continuous Blood Gluc Transmit (DEXCOM G6 TRANSMITTER) MISC 1 Device by Does not apply route as directed. 1 each 3   empagliflozin (JARDIANCE) 25 MG TABS tablet Take 1 tablet (25 mg total) by mouth daily before breakfast. 90 tablet 3   fenofibrate 160 MG tablet TAKE ONE TABLET BY MOUTH EVERY EVENING 90 tablet 1   ferrous sulfate 325 (65 FE) MG tablet Take 325 mg by mouth daily with breakfast.      Flaxseed, Linseed, (FLAXSEED OIL PO) Take 1 capsule by mouth 2 (two) times daily.      FLUoxetine (PROZAC) 40 MG capsule TAKE ONE CAPSULE BY MOUTH ONCE DAILY 90 capsule 1   fluticasone (FLONASE) 50 MCG/ACT nasal spray SHAKE LIQUID AND USE 2 SPRAYS IN EACH NOSTRIL DAILY 48 g 1   glucose blood (TRUE METRIX BLOOD GLUCOSE TEST) test strip USE AS DIRECTED 4 TIMES A DAY 400 strip 1   HYDROcodone-acetaminophen (NORCO) 5-325 MG tablet Take 1 tablet by mouth every 6 (six) hours as needed for moderate pain. 30 tablet 0   insulin aspart (NOVOLOG FLEXPEN) 100 UNIT/ML FlexPen Max daily 70 units 30 mL 6   insulin degludec (TRESIBA FLEXTOUCH) 100 UNIT/ML FlexTouch Pen Inject 40 Units into the skin daily. 30 mL 6   Insulin Pen Needle 32G X 4 MM MISC 1 Device by Does not apply route 4 (four) times daily. 400 each 3   Lancet Devices (LANCING DEVICE) MISC      levocetirizine (XYZAL) 5 MG tablet TAKE 1 TABLET(5 MG) BY MOUTH EVERY EVENING 90 tablet 1   lisinopril (ZESTRIL) 40 MG tablet Take 1 tablet (40 mg total) by mouth daily. 90 tablet 3   meloxicam (MOBIC) 7.5 MG tablet Take 1 tablet (7.5 mg total) by mouth daily as needed for pain. 30 tablet 0   methocarbamol (ROBAXIN) 500 MG tablet Take 1 tablet (500 mg total) by mouth 2 (two) times daily as needed. 20 tablet 0   metoprolol succinate (TOPROL-XL) 50 MG 24 hr tablet TAKE ONE TABLET BY MOUTH ONCE DAILY WITH FOOD 90 tablet 1   Multiple Vitamin (MULTIVITAMIN WITH MINERALS) TABS Take 1 tablet by  mouth daily.  Omega-3 Fatty Acids (FISH OIL) 1000 MG CAPS Take by mouth.     traMADol (ULTRAM) 50 MG tablet Take 1 tablet (50 mg total) by mouth 2 (two) times daily as needed. 60 tablet 2   TRUEplus Lancets 33G MISC USE AS DIRECTED 4 TIMES A DAY 400 each 1   vitamin E 400 UNIT capsule Take 400 Units by mouth daily.     FLUoxetine (PROZAC) 20 MG tablet 3 po qd (Patient taking differently: Take 20 mg by mouth daily. 1 tablet daily with the 40 mg) 270 tablet 3   rosuvastatin (CRESTOR) 20 MG tablet TAKE ONE TABLET BY MOUTH EVERY EVENING 90 tablet 1   No facility-administered medications prior to visit.    Allergies  Allergen Reactions   Metformin And Related Other (See Comments)    Sick on stomach, sweaty, throw up, pain   Levemir [Insulin Detemir] Hives and Swelling   Clindamycin Nausea And Vomiting   Crestor [Rosuvastatin Calcium] Palpitations    Review of Systems  Constitutional:  Negative for fever.  HENT:  Negative for congestion, ear pain, hearing loss, sinus pain and sore throat.   Eyes:  Negative for blurred vision and pain.  Respiratory:  Negative for cough, sputum production, shortness of breath and wheezing.   Cardiovascular:  Negative for chest pain and palpitations.  Gastrointestinal:  Negative for blood in stool, constipation, diarrhea, nausea and vomiting.  Genitourinary:  Negative for dysuria, frequency, hematuria and urgency.  Musculoskeletal:  Positive for myalgias (lower left leg). Negative for back pain and falls.  Neurological:  Negative for dizziness, sensory change, loss of consciousness, weakness and headaches.  Endo/Heme/Allergies:  Negative for environmental allergies. Does not bruise/bleed easily.  Psychiatric/Behavioral:  Positive for depression. Negative for suicidal ideas. The patient is nervous/anxious. The patient does not have insomnia.       Objective:    Physical Exam Vitals and nursing note reviewed.  Constitutional:      General: She is not  in acute distress.    Appearance: Normal appearance. She is not ill-appearing.  HENT:     Head: Normocephalic and atraumatic.     Right Ear: External ear normal.     Left Ear: External ear normal.  Eyes:     Extraocular Movements: Extraocular movements intact.     Pupils: Pupils are equal, round, and reactive to light.  Cardiovascular:     Rate and Rhythm: Normal rate and regular rhythm.     Pulses: Normal pulses.     Heart sounds: Normal heart sounds. No murmur heard.   No gallop.  Pulmonary:     Effort: Pulmonary effort is normal. No respiratory distress.     Breath sounds: Normal breath sounds. No wheezing, rhonchi or rales.  Abdominal:     General: Bowel sounds are normal. There is no distension.     Palpations: Abdomen is soft. There is no mass.     Tenderness: There is no abdominal tenderness. There is no guarding or rebound.     Hernia: No hernia is present.  Musculoskeletal:     Cervical back: Normal range of motion and neck supple.     Comments: Pea-sized knot on calf of left leg, non-tender when palpated   Lymphadenopathy:     Cervical: No cervical adenopathy.  Skin:    General: Skin is warm and dry.  Neurological:     Mental Status: She is alert and oriented to person, place, and time.  Psychiatric:  Mood and Affect: Mood normal.        Behavior: Behavior normal.        Thought Content: Thought content normal.        Judgment: Judgment normal.    BP 120/70 (BP Location: Left Arm, Patient Position: Sitting, Cuff Size: Normal)   Pulse (!) 56   Temp 98.1 F (36.7 C) (Oral)   Resp 18   Ht '5\' 5"'  (1.651 m)   Wt 175 lb (79.4 kg)   SpO2 98%   BMI 29.12 kg/m  Wt Readings from Last 3 Encounters:  07/17/21 175 lb (79.4 kg)  06/23/21 173 lb 12.8 oz (78.8 kg)  06/20/21 176 lb 9.6 oz (80.1 kg)    Diabetic Foot Exam - Simple   No data filed    Lab Results  Component Value Date   WBC 7.0 12/06/2020   HGB 12.2 12/06/2020   HCT 36.6 12/06/2020   PLT 182.0  12/06/2020   GLUCOSE 87 04/24/2021   CHOL 116 04/24/2021   TRIG 271.0 (H) 04/24/2021   HDL 28.70 (L) 04/24/2021   LDLDIRECT 53.0 04/24/2021   LDLCALC 57 12/06/2020   ALT 20 04/24/2021   AST 21 04/24/2021   NA 138 04/24/2021   K 3.7 04/24/2021   CL 103 04/24/2021   CREATININE 0.94 04/24/2021   BUN 22 04/24/2021   CO2 26 04/24/2021   TSH 0.58 08/11/2018   INR 1.1 03/03/2009   HGBA1C 8.4 (A) 06/23/2021   MICROALBUR 12.2 (H) 12/06/2020    Lab Results  Component Value Date   TSH 0.58 08/11/2018   Lab Results  Component Value Date   WBC 7.0 12/06/2020   HGB 12.2 12/06/2020   HCT 36.6 12/06/2020   MCV 87.5 12/06/2020   PLT 182.0 12/06/2020   Lab Results  Component Value Date   NA 138 04/24/2021   K 3.7 04/24/2021   CO2 26 04/24/2021   GLUCOSE 87 04/24/2021   BUN 22 04/24/2021   CREATININE 0.94 04/24/2021   BILITOT 0.4 04/24/2021   ALKPHOS 35 (L) 04/24/2021   AST 21 04/24/2021   ALT 20 04/24/2021   PROT 6.7 04/24/2021   ALBUMIN 4.4 04/24/2021   CALCIUM 10.1 04/24/2021   ANIONGAP 13 09/23/2020   GFR 59.45 (L) 04/24/2021   Lab Results  Component Value Date   CHOL 116 04/24/2021   Lab Results  Component Value Date   HDL 28.70 (L) 04/24/2021   Lab Results  Component Value Date   LDLCALC 57 12/06/2020   Lab Results  Component Value Date   TRIG 271.0 (H) 04/24/2021   Lab Results  Component Value Date   CHOLHDL 4 04/24/2021   Lab Results  Component Value Date   HGBA1C 8.4 (A) 06/23/2021       Assessment & Plan:   Problem List Items Addressed This Visit       Unprioritized   Hyperlipidemia associated with type 2 diabetes mellitus ([place]) - Primary   Relevant Medications   atorvastatin (LIPITOR) 40 MG tablet   Other Relevant Orders   Comprehensive metabolic panel   Comprehensive metabolic panel   Lipid panel   Depression with anxiety    con't prozac and prn xanax      Hyperlipidemia LDL goal <70    .pt states the crestor is causing  palpitations --- she never had a problem with lipitor Change to Lipitor 40 mg daily Recheck 2 months      Relevant Medications   atorvastatin (LIPITOR) 40 MG  tablet   Other Visit Diagnoses     Pain of left calf       Relevant Orders   US Venous Img Lower Unilateral Left (DVT)       Meds ordered this encounter  Medications   atorvastatin (LIPITOR) 40 MG tablet    Sig: Take 1 tablet (40 mg total) by mouth daily.    Dispense:  90 tablet    Refill:  3    I,[name],acting as a Education administrator for [employer], DO.,have documented all relevant documentation on the behalf of Ann Held, DO,as directed by  Ann Held, DO while in the presence of Ann Held, DO.   I, Ann Held, DO., personally preformed the services described in this documentation.  All medical record entries made by the scribe were at my direction and in my presence.  I have reviewed the chart and discharge instructions (if applicable) and agree that the record reflects my personal performance and is accurate and complete.07/17/2021

## 2021-07-17 NOTE — Patient Instructions (Signed)
Major Depressive Disorder, Adult Major depressive disorder (MDD) is a mental health condition. It may also be called clinical depression or unipolar depression. MDD causes symptoms of sadness, hopelessness, and loss of interest in things. These symptoms last most of the day, almost every day, for 2 weeks. MDD can also cause physical symptoms. It can interfere with relationships and with everyday activities,such as work, school, and activities that are usually pleasant. MDD may be mild, moderate, or severe. It may be single-episode MDD, whichhappens once, or recurrent MDD, which may occur multiple times. What are the causes? The exact cause of this condition is not known. MDD is most likely caused by a combination of things, which may include: Your personality traits. Learned or conditioned behaviors or thoughts or feelings that reinforce negativity. Any alcohol or substance misuse. Long-term (chronic) physical or mental health illness. Going through a traumatic experience or major life changes. What increases the risk? The following factors may make someone more likely to develop MDD: A family history of depression. Being a woman. Troubled family relationships. Abnormally low levels of certain brain chemicals. Traumatic or painful events in childhood, especially abuse or loss of a parent. A lot of stress from life experiences, such as poor living conditions or discrimination. Chronic physical illness or other mental health disorders. What are the signs or symptoms? The main symptoms of MDD usually include: Constant depressed or irritable mood. A loss of interest in things and activities. Other symptoms include: Sleeping or eating too much or too little. Unexplained weight gain or weight loss. Tiredness or low energy. Being agitated, restless, or weak. Feeling hopeless, worthless, or guilty. Trouble thinking clearly or making decisions. Thoughts of suicide or thoughts of harming  others. Isolating oneself or avoiding other people or activities. Trouble completing tasks, work, or any normal obligations. Severe symptoms of this condition may include: Psychotic depression.This may include false beliefs, or delusions. It may also include seeing, hearing, tasting, smelling, or feeling things that are not real (hallucinations). Chronic depression or persistent depressive disorder. This is low-level depression that lasts for at least 2 years. Melancholic depression, or feeling extremely sad and hopeless. Catatonic depression, which includes trouble speaking and trouble moving. How is this diagnosed? This condition may be diagnosed based on: Your symptoms. Your medical and mental health history. You may be asked questions about your lifestyle, including any drug and alcohol use. A physical exam. Blood tests to rule out other conditions. MDD is confirmed if you have the following symptoms most of the day, nearly every day, in a 2-week period: Either a depressed mood or loss of interest. At least four other MDD symptoms. How is this treated? This condition is usually treated by mental health professionals, such as psychologists, psychiatrists, and clinical social workers. You may need more than one type of treatment. Treatment may include: Psychotherapy, also called talk therapy or counseling. Types of psychotherapy include: Cognitive behavioral therapy (CBT). This teaches you to recognize unhealthy feelings, thoughts, and behaviors, and replace them with positive thoughts and actions. Interpersonal therapy (IPT). This helps you to improve the way you communicate with others or relate to them. Family therapy. This treatment includes members of your family. Medicines to treat anxiety and depression. These medicines help to balance the brain chemicals that affect your emotions. Lifestyle changes. You may be asked to: Limit alcohol use and avoid drug use. Get regular  exercise. Get plenty of sleep. Make healthy eating choices. Spend more time outdoors. Brain stimulation. This may be done   if symptoms are very severe and other treatments have not worked. Examples of this treatment are electroconvulsive therapy and transcranial magnetic stimulation. Follow these instructions at home: Activity Exercise regularly and spend time outdoors. Find activities that you enjoy doing, and make time to do them. Find healthy ways to manage stress, such as: Meditation or deep breathing. Spending time in nature. Journaling. Return to your normal activities as told by your health care provider. Ask your health care provider what activities are safe for you. Alcohol and drug use If you drink alcohol: Limit how much you use to: 0-1 drink a day for women who are not pregnant. 0-2 drinks a day for men. Be aware of how much alcohol is in your drink. In the [place] one drink equals one 12 oz bottle of beer (355 mL), one 5 oz glass of wine (148 mL), or one 1 oz glass of hard liquor (44 mL). Discuss your alcohol use with your health care provider. Alcohol can affect any antidepressant medicines you are taking. Discuss any drug use with your health care provider. General instructions  Take over-the-counter and prescription medicines only as told by your health care provider. Eat a healthy diet and get plenty of sleep. Consider joining a support group. Your health care provider may be able to recommend one. Keep all follow-up visits as told by your health care provider. This is important.  Where to find more information National Alliance on Mental Illness: www.nami.org [place] National Institute of Mental Health: www.nimh.nih.gov Contact a health care provider if: Your symptoms get worse. You develop new symptoms. Get help right away if: You self-harm. You have serious thoughts about hurting yourself or others. You hallucinate. If you ever feel like you may hurt yourself or  others, or have thoughts about taking your own life, get help right away. Go to your nearest emergency department or: Call your local emergency services (911 in the [place] Call a suicide crisis helpline, such as the National Suicide Prevention Lifeline at 1-800-273-8255. This is open 24 hours a day in the [place] Text the Crisis Text Line at 741741 (in the [place] Summary Major depressive disorder (MDD) is a mental health condition. MDD causes symptoms of sadness, hopelessness, and loss of interest in things. These symptoms last most of the day, almost every day, for 2 weeks. The symptoms of MDD can interfere with relationships and with everyday activities. Treatments and support are available for people who develop MDD. You may need more than one type of treatment. Get help right away if you have serious thoughts about hurting yourself or others. This information is not intended to replace advice given to you by your health care provider. Make sure you discuss any questions you have with your healthcare provider. Document Revised: 09/05/2019 Document Reviewed: 09/05/2019 [employer] Patient Education  2022 [employer]  

## 2021-07-18 ENCOUNTER — Telehealth: Payer: Self-pay | Admitting: *Deleted

## 2021-07-18 ENCOUNTER — Other Ambulatory Visit: Payer: Medicare HMO

## 2021-07-18 NOTE — Telephone Encounter (Signed)
Pt is scheduled for venous u/s tomorrow at 2pm.  [facility] had no availability today. [facility] on [address] had no availability today either. Pt did not want to go to [facility] or [facility] and prefers to keep it at [facility]. I told pt we would reach back out to her if PCP felt u/s should be done earlier. Pt voices understanding. If pt does need u/s earlier than tomorrow, please reach out to [facility] for additional scheduling options.

## 2021-07-19 ENCOUNTER — Ambulatory Visit (HOSPITAL_BASED_OUTPATIENT_CLINIC_OR_DEPARTMENT_OTHER): Admission: RE | Admit: 2021-07-19 | Payer: Medicare HMO | Source: Ambulatory Visit

## 2021-07-21 ENCOUNTER — Other Ambulatory Visit: Payer: Self-pay

## 2021-07-21 ENCOUNTER — Ambulatory Visit (HOSPITAL_BASED_OUTPATIENT_CLINIC_OR_DEPARTMENT_OTHER)
Admission: RE | Admit: 2021-07-21 | Discharge: 2021-07-21 | Disposition: A | Discharge status: Home / Self Care | Payer: Medicare HMO | Source: Ambulatory Visit | Attending: Family Medicine | Admitting: Family Medicine

## 2021-07-21 ENCOUNTER — Other Ambulatory Visit: Payer: Self-pay | Admitting: Family Medicine

## 2021-07-21 ENCOUNTER — Other Ambulatory Visit: Payer: Self-pay | Admitting: Cardiology

## 2021-07-21 DIAGNOSIS — R600 Localized edema: Secondary | ICD-10-CM | POA: Diagnosis not present

## 2021-07-21 DIAGNOSIS — M79662 Pain in left lower leg: Secondary | ICD-10-CM | POA: Insufficient documentation

## 2021-07-21 DIAGNOSIS — I10 Essential (primary) hypertension: Secondary | ICD-10-CM

## 2021-07-21 NOTE — Telephone Encounter (Signed)
Rx(s) sent to pharmacy electronically.  

## 2021-07-25 ENCOUNTER — Ambulatory Visit: Payer: Medicare HMO | Admitting: Family Medicine

## 2021-08-08 ENCOUNTER — Encounter: Payer: Self-pay | Admitting: Gastroenterology

## 2021-08-08 ENCOUNTER — Ambulatory Visit: Payer: Medicare HMO | Admitting: Gastroenterology

## 2021-08-08 VITALS — BP 138/62 | HR 64 | Ht 65.0 in | Wt 171.0 lb

## 2021-08-08 DIAGNOSIS — Z01818 Encounter for other preprocedural examination: Secondary | ICD-10-CM | POA: Diagnosis not present

## 2021-08-08 DIAGNOSIS — Z1211 Encounter for screening for malignant neoplasm of colon: Secondary | ICD-10-CM

## 2021-08-08 NOTE — Progress Notes (Signed)
HPI: This is a very pleasant 74 year old woman who was referred to me by Carollee Herter, Alferd Apa, *  to discuss colon cancer screening.    At first she said she was here to schedule colonoscopy and then we had a very nice discussion about the options for routine risk colon cancer screening.  She has no GI symptoms.  Specifically no bleeding, no constipation, no diarrhea.  No serious abdominal pains.  Her weight is overall stable.  Colon cancer does not run in her family  Old Data Reviewed: She underwent a colonoscopy July 2008 with Dr. Verl Blalock.  This was routine risk screening.  Diverticulosis was noted.  A small NSAID appearing erosion was noted in her proximal colon.  No polyps were found.  Blood work March 2022 showed a normal CBC  Blood work October 2022 showed normal comprehensive metabolic profile.    Review of systems: Pertinent positive and negative review of systems were noted in the above HPI section. All other review negative.   Past Medical History:  Diagnosis Date   Blockage of coronary artery of heart (HCC)    Depression    Diabetes mellitus    Hyperlipidemia    Hypertension    Pancreatitis    Restless leg syndrome     Past Surgical History:  Procedure Laterality Date   CATARACT EXTRACTION, BILATERAL     CORONARY ANGIOPLASTY WITH STENT PLACEMENT     FOOT SURGERY Right    cyst removal   ROTATOR CUFF REPAIR     right   ROTATOR CUFF REPAIR Left    TUBAL LIGATION      Current Outpatient Medications  Medication Sig Dispense Refill   Alcohol Swabs (B-D SINGLE USE SWABS REGULAR) PADS USE AS DIRECTED 400 each 1   alendronate (FOSAMAX) 70 MG tablet TAKE ONE TABLET BY MOUTH ONCE WEEKLY ON WEDNESDAY 12 tablet 3   ALPRAZolam (XANAX) 0.25 MG tablet Take 1 tablet by mouth three times a day if needed 60 tablet 0   amLODipine (NORVASC) 10 MG tablet TAKE ONE TABLET BY MOUTH ONCE DAILY 90 tablet 1   aspirin EC 81 MG tablet Take 81 mg by mouth daily.      atorvastatin (LIPITOR) 40 MG tablet Take 1 tablet (40 mg total) by mouth daily. 90 tablet 3   Blood Glucose Calibration (TAI DOC CONTROL) NORMAL SOLN      Blood Glucose Monitoring Suppl (TRUE METRIX AIR GLUCOSE METER) w/Device KIT 1 each by Does not apply route 4 (four) times daily. DX CODE E11.8 1 kit 0   Carboxymethylcellulose Sodium (THERATEARS OP) Place 1 drop into both eyes daily as needed (dry eyes).     Continuous Blood Gluc Transmit (DEXCOM G6 TRANSMITTER) MISC 1 Device by Does not apply route as directed. 1 each 3   empagliflozin (JARDIANCE) 25 MG TABS tablet Take 1 tablet (25 mg total) by mouth daily before breakfast. 90 tablet 3   fenofibrate 160 MG tablet TAKE ONE TABLET BY MOUTH EVERY EVENING 90 tablet 1   ferrous sulfate 325 (65 FE) MG tablet Take 325 mg by mouth daily with breakfast.      Flaxseed, Linseed, (FLAXSEED OIL PO) Take 1 capsule by mouth 2 (two) times daily.      FLUoxetine (PROZAC) 40 MG capsule TAKE ONE CAPSULE BY MOUTH ONCE DAILY 90 capsule 1   fluticasone (FLONASE) 50 MCG/ACT nasal spray SHAKE LIQUID AND USE 2 SPRAYS IN EACH NOSTRIL DAILY 48 g 1   glucose blood (  TRUE METRIX BLOOD GLUCOSE TEST) test strip USE AS DIRECTED 4 TIMES A DAY 400 strip 1   HYDROcodone-acetaminophen (NORCO) 5-325 MG tablet Take 1 tablet by mouth every 6 (six) hours as needed for moderate pain. 30 tablet 0   insulin aspart (NOVOLOG FLEXPEN) 100 UNIT/ML FlexPen Max daily 70 units 30 mL 6   insulin degludec (TRESIBA FLEXTOUCH) 100 UNIT/ML FlexTouch Pen Inject 40 Units into the skin daily. 30 mL 6   Insulin Pen Needle 32G X 4 MM MISC 1 Device by Does not apply route 4 (four) times daily. 400 each 3   Lancet Devices (LANCING DEVICE) MISC      levocetirizine (XYZAL) 5 MG tablet TAKE 1 TABLET(5 MG) BY MOUTH EVERY EVENING 90 tablet 1   lisinopril (ZESTRIL) 40 MG tablet Take 1 tablet (40 mg total) by mouth daily. Need appointment 30 tablet 0   meloxicam (MOBIC) 7.5 MG tablet Take 1 tablet (7.5 mg total)  by mouth daily as needed for pain. 30 tablet 0   methocarbamol (ROBAXIN) 500 MG tablet Take 1 tablet (500 mg total) by mouth 2 (two) times daily as needed. 20 tablet 0   metoprolol succinate (TOPROL-XL) 50 MG 24 hr tablet TAKE ONE TABLET BY MOUTH ONCE DAILY WITH FOOD 90 tablet 1   Multiple Vitamin (MULTIVITAMIN WITH MINERALS) TABS Take 1 tablet by mouth daily.     Omega-3 Fatty Acids (FISH OIL) 1000 MG CAPS Take by mouth.     traMADol (ULTRAM) 50 MG tablet Take 1 tablet (50 mg total) by mouth 2 (two) times daily as needed. 60 tablet 2   TRUEplus Lancets 33G MISC USE AS DIRECTED 4 TIMES A DAY 400 each 1   vitamin E 400 UNIT capsule Take 400 Units by mouth daily.     No current facility-administered medications for this visit.    Allergies as of 08/08/2021 - Review Complete 08/08/2021  Allergen Reaction Noted   Metformin and related Other (See Comments) 10/23/2012   Levemir [insulin detemir] Hives and Swelling 01/05/2011   Clindamycin Nausea And Vomiting 11/24/2020   Crestor [rosuvastatin calcium] Palpitations 07/17/2021    Family History  Problem Relation Age of Onset   Heart attack Father    Heart attack Brother    Diabetes Sister    Cirrhosis Sister    Hypertension Sister    Cancer Mother    Hypertension Mother    Vision loss Mother    Cancer Sister    Sleep apnea Sister    Heart Problems Sister    Diabetes Sister    Gout Sister    Diabetes Son    Heart attack Son    Diabetes Daughter    Goiter Daughter    Diabetes Daughter     Social History   Socioeconomic History   Marital status: Married    Spouse name: Not on file   Number of children: Not on file   Years of education: Not on file   Highest education level: Not on file  Occupational History   Not on file  Tobacco Use   Smoking status: Former    Packs/day: 0.20    Types: Cigarettes    Quit date: 05/05/2020    Years since quitting: 1.2   Smokeless tobacco: Never   Tobacco comments:    quit in 2021   Vaping Use   Vaping Use: Never used  Substance and Sexual Activity   Alcohol use: No   Drug use: No   Sexual activity: Not on  file  Other Topics Concern   Not on file  Social History Narrative   Exercise: seldom   Caffeine use: seldom   Social Determinants of Health   Financial Resource Strain: Medium Risk   Difficulty of Paying Living Expenses: Somewhat hard  Food Insecurity: Not on file  Transportation Needs: Not on file  Physical Activity: Inactive   Days of Exercise per Week: 0 days   Minutes of Exercise per Session: 0 min  Stress: Not on file  Social Connections: Not on file  Intimate Partner Violence: Not on file     Physical Exam: BP 138/62   Pulse 64   Ht 5' 5"  (1.651 m)   Wt 171 lb (77.6 kg)   BMI 28.46 kg/m  Constitutional: generally well-appearing Psychiatric: alert and oriented x3 Eyes: extraocular movements intact Mouth: oral pharynx moist, no lesions Neck: supple no lymphadenopathy Cardiovascular: heart regular rate and rhythm Lungs: clear to auscultation bilaterally Abdomen: soft, nontender, nondistended, no obvious ascites, no peritoneal signs, normal bowel sounds Extremities: no lower extremity edema bilaterally Skin: no lesions on visible extremities   Assessment and plan: 75 y.o. female with routine risk for colon cancer  We had a very nice discussion about colon cancer, screening for colon cancer.  She understands that she is at routine risk for colon cancer.  We discussed the risks and benefits to screening via colonoscopy versus screening via Cologuard stool based test.  She decided to go ahead with Cologuard stool based test.  She understands that if it is positive I will then likely recommend a full colonoscopy.  I see no reason for any further blood tests or imaging studies prior to then.  Please see the "Patient Instructions" section for addition details about the plan.   Owens Loffler, MD [facility] 08/08/2021, 11:05  AM  Cc: Carollee Herter, Alferd Apa, *  Total time on date of encounter was 40 minutes (this included time spent preparing to see the patient reviewing records; obtaining and/or reviewing separately obtained history; performing a medically appropriate exam and/or evaluation; counseling and educating the patient and family if present; ordering medications, tests or procedures if applicable; and documenting clinical information in the health record).

## 2021-08-08 NOTE — Patient Instructions (Signed)
If you are age 75 or older, your body mass index should be between 23-30. Your Body mass index is 28.46 kg/m. If this is out of the aforementioned range listed, please consider follow up with your Primary Care Provider. ________________________________________________________  The [facility] providers would like to encourage you to use [facility] to communicate with providers for non-urgent requests or questions.  Due to long hold times on the telephone, sending your provider a message by [facility] may be a faster and more efficient way to get a response.  Please allow 48 business hours for a response.  Please remember that this is for non-urgent requests.  _______________________________________________________  Your provider has ordered Cologuard testing as an option for colon cancer screening. This is performed by [employer] [facility] [facility] and may be out of network with your insurance. PRIOR to completing the test, it is YOUR responsibility to contact your insurance about covered benefits for this test. Your out of pocket expense could be anywhere from $0.00 to $649.00.   When you call to check coverage with your insurer, please provide the following information:   -The ONLY provider of Cologuard is Opt[facility], dispensing  - CPT code for Cologuard is [phone].  [facility] NPI # [id number]  -[employer] Tax ID # [id number]   We have already sent your demographic and insurance information to [employer] (phone number [phone]) and they should contact you within the next week regarding your test. If you have not heard from them within the next week, please call our office at [phone].  Thank you for entrusting me with your care and choosing [facility].  Dr Christella Hartigan

## 2021-08-09 ENCOUNTER — Telehealth: Payer: Medicare HMO

## 2021-08-10 ENCOUNTER — Telehealth: Payer: Self-pay | Admitting: *Deleted

## 2021-08-10 DIAGNOSIS — Z006 Encounter for examination for normal comparison and control in clinical research program: Secondary | ICD-10-CM

## 2021-08-10 NOTE — Telephone Encounter (Addendum)
I called patient for clarification of Coordinate-Diabetes Study. I. Patient is to continue the Coordinate-Diabetes Study with [facility] for up to 5 years. Patient needs to sign medical release form and social security index form. I was unable to leave message due to mailbox being full.I will try to call her later.    I called patient and she agreed to let [facility] follow her up to 5 years. I will send medical release form and social security index form to patient. She will sign them and return to [name] in postage paid envelope.

## 2021-08-21 ENCOUNTER — Ambulatory Visit (INDEPENDENT_AMBULATORY_CARE_PROVIDER_SITE_OTHER): Payer: Medicare HMO | Admitting: Pharmacist

## 2021-08-21 DIAGNOSIS — E1165 Type 2 diabetes mellitus with hyperglycemia: Secondary | ICD-10-CM

## 2021-08-21 DIAGNOSIS — I10 Essential (primary) hypertension: Secondary | ICD-10-CM

## 2021-08-21 DIAGNOSIS — E785 Hyperlipidemia, unspecified: Secondary | ICD-10-CM

## 2021-08-21 DIAGNOSIS — E1169 Type 2 diabetes mellitus with other specified complication: Secondary | ICD-10-CM

## 2021-08-21 MED ORDER — LISINOPRIL 40 MG PO TABS: 40.0000 mg | ORAL_TABLET | Freq: Every day | ORAL | 0 refills | Status: DC

## 2021-08-23 DIAGNOSIS — Z1211 Encounter for screening for malignant neoplasm of colon: Secondary | ICD-10-CM | POA: Diagnosis not present

## 2021-08-25 NOTE — Chronic Care Management (AMB) (Signed)
Chronic Care Management Pharmacy Note  08/25/2021 Name:  [name] MRN:  [id number] DOB:  [birth year]  Summary:  Needed updated Rx for lisinopril. Patient has not received Dex Com yet.   Recommendations / Plan:  [place] to see what information is needed for complete order for DexCom. Need forms from patient - assignment of benefits and coinsurance payment form.  Patient's cost for Dexcom for initial supplies will be $192.49 - patient is Ok with this. [place] will mail patient forms. [place] also needs updated office notes with new insulin regimen from Endo - they will request from Dr Kelton Pillar.  Updated lisinopril prescription.   Subjective: [name] is an 75 y.o. year old female who is a primary patient of Ann Held, DO.  The CCM team was consulted for assistance with disease management and care coordination needs.    Engaged with patient by telephone for  to follow up type 2 DM and medication assistance application for Jardiance / [place] program  in response to provider referral for pharmacy case management and/or care coordination services.   Consent to Services:  The patient was given information about Chronic Care Management services, agreed to services, and gave verbal consent prior to initiation of services.  Please see initial visit note for detailed documentation.   Patient Care Team: Carollee Herter, Alferd Apa, DO as PCP - General Buford Dresser, MD as PCP - Cardiology (Cardiology) Ashok Pall, MD as Consulting Physician (Neurosurgery) Monna Fam, MD as Consulting Physician (Ophthalmology) Cherre Robins, RPH-CPP (Pharmacist) [facility], Melanie Crazier, MD as Consulting Physician (Endocrinology)  Recent office visits: 07/17/2021 - Fam Med (Dr Etter Sjogren) F/U depression and anxiety. Using alprazolam less since fluoxetine started. Changed rosuvastatin 76m to atorvastatin 467mbecuase patient felt  rosuvastatin was causing palpitations at night. Ultrasound of lower extremities ordered - no DVT noted.  06/20/2021 - PCP (Dr LoEtter SjogrenF/U depression and anxiety. Increased fluoxetine to 6066maily. Refilled alprazolam 0.26m34meceived flu vaccine  06/02/25/2022 - Phone call to PCP office. Patient feels fluoxetine and alprazolam not working. Asked for increase in dose. PCP recommended OV or video visit to discuss. Unable to reach patient to schedule appt.  04/24/2021 - PCP (Dr LownCarollee Herterrgical clearance.Waiting for A1c < 8.0 to get shoulder surgery. Labs checked. A1c was 8.4%   Recent consult visits: 08/08/2021 - GI (Dr JacoArdis Hughsen for colon cancer screening. Discussed the risks and benefits to screening via colonoscopy versus screening via Cologuard stool based test.  She decided to go ahead with Cologuard stool based test 06/23/2021 - Endo (Dr ShamKelton PillarU type 2 DM, insuiln dependent. Trying to get A1c < 8.0% for shoulder surgery. Changed Novolin 70/30 Mix to basal / prandial regimen. Tresiba 40 units once daily. Start Novolog 12 units with each meal and also use correction factor. Continue Jardiance 26mg71mh morning.  05/10/2021 - Endo (Dr ShamlKelton Pillar DM with h/o pancreatitis. A1c trending down - was 8.1%. Increaesd Novolin Mix to 46 units before breakfast and 40 units before evening meal. Prescribed DexCom  05/10/2021 - Ortho Surgery (Dr Xu) FErlinda Hong back pain. Recommended epidural steroid but patient concerned about increasing BG. Instead referral place for neurosurgery.    Hospital visits: 05/04/2021 - ED Visit for lumbar radiculopathy. Prescribed predinsone 20mg 20mr for 6 days and robaxin 500mg e62m 8 hours. Also told to take OTC NSAIDs prn. Objective:  Lab Results  Component Value Date   CREATININE 0.85 07/17/2021   CREATININE 0.94  04/24/2021   CREATININE 0.96 12/06/2020    Lab Results  Component Value Date   HGBA1C 8.4 (A) 06/23/2021   Last diabetic Eye exam:  Lab  Results  Component Value Date/Time   HMDIABEYEEXA Retinopathy (A) 12/30/2020 12:00 AM    Last diabetic Foot exam: No results found for: HMDIABFOOTEX  - Done 12/29/2020 by Dr Kelton Pillar.     Component Value Date/Time   CHOL 116 04/24/2021 1542   TRIG 271.0 (H) 04/24/2021 1542   HDL 28.70 (L) 04/24/2021 1542   CHOLHDL 4 04/24/2021 1542   VLDL 54.2 (H) 04/24/2021 1542   LDLCALC 57 12/06/2020 1603   LDLCALC 61 06/15/2020 0819   LDLDIRECT 53.0 04/24/2021 1542    Hepatic Function Latest Ref Rng & Units 07/17/2021 04/24/2021 12/06/2020  Total Protein 6.0 - 8.3 g/dL 6.7 6.7 7.2  Albumin 3.5 - 5.2 g/dL 4.3 4.4 4.4  AST 0 - 37 U/L _0 ALT 0 - 35 U/L _1 Alk Phosphatase 39 - 117 U/L 38(L) 35(L) 38(L)  Total Bilirubin 0.2 - 1.2 mg/dL 0.6 0.4 0.5  Bilirubin, Direct 0.0 - 0.3 mg/dL - - -    Lab Results  Component Value Date/Time   TSH 0.58 08/11/2018 02:34 PM   TSH 0.57 07/16/2018 09:50 AM   FREET4 0.74 08/11/2018 02:34 PM   FREET4 0.84 04/16/2013 11:19 AM    CBC Latest Ref Rng & Units 12/06/2020 09/23/2020 08/11/2018  WBC 4.0 - 10.5 K/uL 7.0 13.9(H) 11.2(H)  Hemoglobin 12.0 - 15.0 g/dL 12.2 13.5 12.3  Hematocrit 36.0 - 46.0 % 36.6 40.1 36.9  Platelets 150.0 - 400.0 K/uL 182.0 215 221.0    No results found for: VD25OH  Clinical ASCVD: Yes  The ASCVD Risk score (Arnett DK, et al., 2019) failed to calculate for the following reasons:   The valid total cholesterol range is 130 to 320 mg/dL    Other: DEXA 05/29/2017 Forearm Radius 33% T-score = -2.5.  DualFemur Neck Left  T-Score = -2.0; Which was a statistically significant increase in BMD of the left hip since prior exam dated 12/17/2013.  Social History   Tobacco Use  Smoking Status Former   Packs/day: 0.20   Types: Cigarettes   Quit date: 05/05/2020   Years since quitting: 1.3  Smokeless Tobacco Never  Tobacco Comments   quit in 2021   BP Readings from Last 3 Encounters:  08/08/21 138/62  07/17/21 120/70   06/23/21 134/72   Pulse Readings from Last 3 Encounters:  08/08/21 64  07/17/21 (!) 56  06/23/21 90   Wt Readings from Last 3 Encounters:  08/08/21 171 lb (77.6 kg)  07/17/21 175 lb (79.4 kg)  06/23/21 173 lb 12.8 oz (78.8 kg)    Assessment: Review of patient past medical history, allergies, medications, health status, including review of consultants reports, laboratory and other test data, was performed as part of comprehensive evaluation and provision of chronic care management services.   SDOH:  (Social Determinants of Health) assessments and interventions performed:      CCM Care Plan  Allergies  Allergen Reactions   Metformin And Related Other (See Comments)    Sick on stomach, sweaty, throw up, pain   Levemir [Insulin Detemir] Hives and Swelling   Clindamycin Nausea And Vomiting   Crestor [Rosuvastatin Calcium] Palpitations    Medications Reviewed Today     Reviewed by Cherre Robins, RPH-CPP (Pharmacist) on 08/21/21 at 1447  Med List Status: <None>   Medication Order Taking?  Sig Documenting Provider Last Dose Status Informant  Alcohol Swabs (B-D SINGLE USE SWABS REGULAR) PADS [id number] Yes USE AS DIRECTED Carollee Herter, Alferd Apa, DO Taking Active   alendronate (FOSAMAX) 70 MG tablet [id number] Yes TAKE ONE TABLET BY MOUTH ONCE WEEKLY ON WEDNESDAY Ann Held, DO Taking Active   ALPRAZolam Duanne Moron) 0.25 MG tablet [id number] Yes Take 1 tablet by mouth three times a day if needed Ann Held, DO Taking Active   amLODipine (NORVASC) 10 MG tablet [id number] Yes TAKE ONE TABLET BY MOUTH ONCE DAILY Ann Held, DO Taking Active   aspirin EC 81 MG tablet [id number] Yes Take 81 mg by mouth daily. [provider] Taking Active Self  atorvastatin (LIPITOR) 40 MG tablet [id number] Yes Take 1 tablet (40 mg total) by mouth daily. Roma Schanz R, DO Taking Active   Blood Glucose Calibration (TAI [facility] CONTROL) NORMAL SOLN [id number] Yes  [provider] Taking Active Self  Blood Glucose Monitoring Suppl (TRUE METRIX AIR GLUCOSE METER) w/Device KIT [id number] Yes 1 each by Does not apply route 4 (four) times daily. DX CODE E11.8 Ann Held, DO Taking Active   Carboxymethylcellulose Sodium (THERATEARS OP) [id number] Yes Place 1 drop into both eyes daily as needed (dry eyes). [provider] Taking Active Self  Continuous Blood Gluc Transmit (DEXCOM G6 TRANSMITTER) MISC [id number] No 1 Device by Does not apply route as directed.  Patient not taking: Reported on 08/21/2021   Shamleffer, Melanie Crazier, MD Not Taking Active            Med Note Antony Contras, [place] Jul 04, 2021 11:19 AM) Has not received yet - just approved 06/2021  empagliflozin (JARDIANCE) 25 MG TABS tablet [id number] Yes Take 1 tablet (25 mg total) by mouth daily before breakfast. Shamleffer, Melanie Crazier, MD Taking Active   fenofibrate 160 MG tablet [id number] Yes TAKE ONE TABLET BY MOUTH EVERY EVENING Ann Held, DO Taking Active   ferrous sulfate 325 (65 FE) MG tablet [id number] Yes Take 325 mg by mouth daily with breakfast.  [provider] Taking Active Self  Flaxseed, Linseed, (FLAXSEED OIL PO) [id number] Yes Take 1 capsule by mouth 2 (two) times daily.  [provider] Taking Active Self  FLUoxetine (PROZAC) 40 MG capsule [id number] Yes TAKE ONE CAPSULE BY MOUTH ONCE DAILY Carollee Herter, Alferd Apa, DO Taking Active   fluticasone (FLONASE) 50 MCG/ACT nasal spray [id number] Yes SHAKE LIQUID AND USE 2 SPRAYS IN [facility] NOSTRIL DAILY Roma Schanz R, DO Taking Active   glucose blood (TRUE METRIX BLOOD GLUCOSE TEST) test strip [id number] Yes USE AS DIRECTED 4 TIMES A DAY Carollee Herter, Yvonne R, DO Taking Active   HYDROcodone-acetaminophen (NORCO) 5-325 MG tablet [id number] Yes Take 1 tablet by mouth every 6 (six) hours as needed for moderate pain. Roma Schanz R, DO Taking Active   insulin aspart (NOVOLOG FLEXPEN) 100  UNIT/ML FlexPen [id number] Yes Max daily 70 units Shamleffer, Melanie Crazier, MD Taking Active   insulin degludec (TRESIBA FLEXTOUCH) 100 UNIT/ML FlexTouch Pen [id number] Yes Inject 40 Units into the skin daily. Shamleffer, Melanie Crazier, MD Taking Active   Insulin Pen Needle 32G X 4 MM MISC [id number] Yes 1 Device by Does not apply route 4 (four) times daily. Shamleffer, Melanie Crazier, MD Taking Active   Lancet Devices (LANCING DEVICE) [place] [id number] Yes  [provider] Taking Active Self  levocetirizine (XYZAL) 5 MG tablet [id number] Yes  TAKE 1 TABLET(5 MG) BY MOUTH EVERY EVENING Roma Schanz R, DO Taking Active   lisinopril (ZESTRIL) 40 MG tablet [id number]  Take 1 tablet (40 mg total) by mouth daily. Cherre Robins, RPH-CPP  Active   meloxicam (MOBIC) 7.5 MG tablet [id number] Yes Take 1 tablet (7.5 mg total) by mouth daily as needed for pain. Ann Held, DO Taking Active   methocarbamol (ROBAXIN) 500 MG tablet [id number] Yes Take 1 tablet (500 mg total) by mouth 2 (two) times daily as needed. Aundra Dubin, PA-C Taking Active   metoprolol succinate (TOPROL-XL) 50 MG 24 hr tablet [id number] Yes TAKE ONE TABLET BY MOUTH ONCE DAILY WITH FOOD Carollee Herter, Alferd Apa, DO Taking Active   Multiple Vitamin (MULTIVITAMIN WITH MINERALS) TABS [id number] Yes Take 1 tablet by mouth daily. [provider] Taking Active Self  Omega-3 Fatty Acids (FISH OIL) 1000 MG CAPS [id number] Yes Take by mouth. [provider] Taking Active            Med Note Elana Alm, [place] Oct 14, 2020  9:11 AM)    traMADol (ULTRAM) 50 MG tablet [id number] Yes Take 1 tablet (50 mg total) by mouth 2 (two) times daily as needed. Aundra Dubin, PA-C Taking Active   TRUEplus Lancets 33G MISC [id number] Yes USE AS DIRECTED 4 TIMES A DAY Ann Held, DO Taking Active   vitamin E 400 UNIT capsule [id number] Yes Take 400 Units by mouth daily. [provider] Taking Active Self             Patient Active Problem List   Diagnosis Date Noted   Depression with anxiety 06/21/2021   Diabetes mellitus ([place]) 01/17/2021   Type 2 diabetes mellitus with hyperglycemia, without long-term current use of insulin ([place]) 01/17/2021   Acute pain of right shoulder 08/18/2020   Primary osteoarthritis of right knee 08/18/2020   Impacted cerumen of right ear 05/24/2020   Bilateral impacted cerumen 05/17/2020   Hyperlipidemia associated with type 2 diabetes mellitus ([place]) 03/10/2020   Cough 03/10/2020   Seasonal allergies 03/10/2020   Elevated rheumatoid factor 07/21/2018   Gastroenteritis 07/21/2018   Trigger middle finger of left hand 12/19/2017   Ganglion cyst of dorsum of right wrist 12/19/2017   Morton's neuroma of right foot 12/19/2017   Preventative health care 12/13/2017   Pain in both hands 12/13/2017   Hyperlipidemia LDL goal <100 12/13/2017   Abscess of upper gum 12/13/2017   Spinal stenosis of cervical region 09/25/2016   Cervical radiculopathy 09/06/2016   Dyspnea 02/18/2016   Chest pain 02/18/2016   HLD (hyperlipidemia) 02/18/2016   CAD in native artery 02/18/2016   RLS (restless legs syndrome) 02/18/2016   Pancreatitis 02/18/2016   SOB (shortness of breath) 02/18/2016   Uncontrolled type 2 diabetes mellitus with complication    Tobacco abuse    Osteoarthritis of right knee 07/05/2015   Smoking 07/02/2013   Acute upper respiratory infections of unspecified site 07/02/2013   Flu vaccine need 07/02/2013   Multinodular goiter 04/16/2013   Bunion 07/07/2012   BACK PAIN, THORACIC REGION, RIGHT 05/24/2009   IRON DEFICIENCY 03/25/2009   GERD 03/25/2009   WEIGHT LOSS 03/25/2009   SINUSITIS- ACUTE-NOS 11/25/2008   Hyperlipidemia LDL goal <70 10/07/2008   ACUTE PANCREATITIS 09/07/2008   OVARIAN CYST 09/07/2008   KNEE PAIN, RIGHT 05/24/2008   CARPAL TUNNEL SYNDROME, RIGHT 05/12/2008   Acute pain of both shoulders 04/02/2008   ROTATOR CUFF REPAIR, RIGHT, HX  OF  03/29/2008   GOITER, MULTINODULAR 12/26/2007   Depression 07/07/2007   HIP PAIN, RIGHT 03/11/2007   RESTLESS LEG SYNDROME, HX OF 03/11/2007   Diabetes mellitus type II, uncontrolled ([place]) 03/07/2007   Essential hypertension 03/07/2007    Immunization History  Administered Date(s) Administered   Fluad Quad(high Dose 65+) 07/28/2019, 06/20/2021   Influenza Split 08/08/2012   Influenza, High Dose Seasonal PF 08/09/2014, 07/05/2015, 07/21/2018   PFIZER(Purple Top)SARS-COV-2 Vaccination 12/07/2019, 01/04/2020   PPD Test 08/27/2011, 09/04/2011, 07/13/2014, 09/23/2015   Pneumococcal Conjugate-13 08/09/2014   Pneumococcal Polysaccharide-23 11/17/2013   Td 03/11/2007   Zoster, Live 03/11/2007    Conditions to be addressed/monitored: CAD, HTN, HLD, DMII, Anxiety, Depression and shoulder pain; OA of knee; osteoporosis; h/o pancreatitis  Care Plan : General Pharmacy (Adult)  Updates made by Cherre Robins, RPH-CPP since 08/25/2021 12:00 AM     Problem: Chronic Disease Management support, education, and care coordination needs related to HTN, Hyperlipidemia; CAD, Diabetes, Depression/Anxiety, Osteoporosis, Neuropathy   Priority: High  Onset Date: 02/23/2021  Note:   Current Barriers:  Unable to independently afford treatment regimen Unable to achieve control of type 2 DM  Does not adhere to prescribed medication regimen Chronic Disease Management support, education, and care coordination needs related to HTN, Hyperlipidemia, CAD, Diabetes, Depression/Anxiety, Osteoporosis, Neuropathy  Pharmacist Clinical Goal(s):  Over the next 90 days, patient will verbalize ability to afford treatment regimen achieve control of type 2 DM as evidenced by A1c <7.0% maintain control of HTN and lipids as evidenced by BP <130/80 (per cardio) and LDL <70  adhere to prescribed medication regimen as evidenced by patient report of taking medications daily  through collaboration with PharmD and provider.    Interventions: 1:1 collaboration with Carollee Herter, Alferd Apa, DO regarding development and update of comprehensive plan of care as evidenced by provider attestation and co-signature Inter-disciplinary care team collaboration (see longitudinal plan of care) Comprehensive medication review performed; medication list updated in electronic medical record  Hypertension Controlled; BP goal <130/80 Patient checks BP at home infrequently - usually once per week  Patient has failed these meds in the past: None noted  Current regimen:  Amlodipine 28m daily Lisinopril 428mdaily Metoprolol Succinate 509maily Interventions: (addressed at previous visit)  Requested patient to check her blood pressure 2 times per week and record Ensure daily salt intake < 2300 mg/day Continue current regimen for blood pressure Coordinated refill for lisinopril to patient's pharmacy  Hyperlipidemia / CAD Controlled; LDL goal < 70 Current regimen:  Aspirin 56m38mily Fenofibrate 160mg75mly Omega 3 fish Oil 1030mg 47me daily Atorvastatin 40mg d52m (restarted 07/17/2021) Previous statin tried: rosuvastatin 10mg da64m- caused palpitation Interventions: Continue current regimen for heart health and cholesterol Continue to follow low fat diet  Diabetes Lab Results  Component Value Date   HGBA1C 8.4 (A) 06/23/2021   HGBA1C 8.1 (A) 05/10/2021   HGBA1C 8.4 (H) 04/24/2021   A1c not at goal. Initial goal is to get A1c <8.0 so patient can have shoulder surgery but eventually would like A1c goal <7% as long as doesn't increase hypoglycemia Followed by Dr ShamleffKelton Pillarappt was 06/23/2021 Current regimen:  Novolog 12 units with each meal + additional correction based on blood glucose Tresiba 40 units daily Jardiance 25mg - t84m1 tablet each morning Past medications - Januvia 100mg dail13md glimepiride 2mg qam (d95montinue by Dr Shamleffer Kelton Pillaroloin 70/30 insulin - changed 06/2021 due to low  efficacy History of pancreatitis - so  no GLP1s or DPP4s Recent home blood glucose readings: 104, 149 Denies hypoglycemia or blood glucose <80  Applied for PAP for Jardiance 04/28/2021. Needed letter that she was denied LIS. LIS denial letter was faxed 06/23/2021. Called [employer] and they did receive LIS denial and updated income information. Representative for [employer] approved patient to receive Jardiance through 10/07/2021. Vania Rea will ship in 42 to 72 hours and patient should received by 07/13/2021. She has enough Jardiance for 2 weeks until shipment received. Patient received shipment of Jardiance 07/2021. Previously Dr Kelton Pillar has prescribed DexCom but initially was denied because patient was only injecting insulin twice a day. Has been resubmitted with new insulin regimen and approved. Patient states she has not received DexCom yet.  Interventions: Coordinated with [employer] medication assistance program for Jardiance - approved (addressed at previous visit)  Reviewed new insulin regimen, specifically bolus insulin dosing Coordinated with [facility] regarding DexCom order. They need updated information and office notes that state patient is injecting insulin 3 or more times per day. They will request from Dr [facility] office. [place] also needs patient to sign assignment of benefits form and coinsurance payment form. [place] will mail forms to patient.  Discussed DM goals Fasting (before meal blood sugar): 80-130 Post-Prandial (2 hrs after eating blood sugar): less than 180 A1c (3 month blood sugar average): less than 7%  Depression / anxiety Patient reports anxiety and depression has improved some with increase dose of fluoxetine earlier this month Patient has failed these meds in past: sertraline (palpitations) Current regimen:  Alprazolam 0.75m three times daily as needed  Fluoxetine 632mdaily Interventions:  Continue current medications  Osteoporosis   Last DEXA Scan: 05/29/2017             T-Score femoral neck: -2.0             T-Score forearm radius: -2.5 Current regimen:  Alendronate 705meekly Interventions: (addressed at previous visit) Consider repeat DEXA Scan Continue current regimen  Shoulder Pain / osteoarthritis of right knee:  Surgery is planned once A1c is < 8.0% Current regimen:  Tramadol 61m82m to 3 times a day if needed for pain Interventions:  Discuss importance of taking diabetes meds daily and getting A1c <8.0 so surgery can be scheduled.  Continue to follow up with orthopedic office for pain management   Medication management Pharmacist Clinical Goal(s): Over the next 90 days, patient will work with PharmD and providers to maintain optimal medication adherence Current pharmacy: [facility] Interventions Comprehensive medication review performed. Utilize [facility] for medication synchronization, packaging and delivery - Last fill / deliver 08/03/2021  Patient Goals/Self-Care Activities Over the next 90 days, patient will:  take medications as prescribed, check glucose with CGM, document, and provide at future appointments, and collaborate with provider on medication access solutions  Follow Up Plan: Telephone follow up appointment with care management team member scheduled for:  4 weeks  with clinical pharmacist            Medication Assistance:  Jardiance obtained through [employer] assistance program.  Enrollment ends 10/07/2021 .  Anticipate patient will receive first shipment by 07/13/2021    Patient's preferred pharmacy is:  [facility] [id number] [place]: [phone]: [address] [address] [facility]: [phone]:  [place], [address] [place] [address] Phone: [phone] Fax: [phone]  [place] [address] [place] [address] Phone: [phone] Fax: [phone]  [id number] [address] [place]: [phone]: [phone] [facility] (New Address) - [place] Previously: [place] [address]Phone: [phone]: [phone]  Follow Up:  Patient agrees to Care Plan and Follow-up.  Plan: Telephone follow up appointment with care management team member scheduled for:  3 weeks    TCherre Robins PharmD Clinical Pharmacist [facility]ills[facility] HospitalPrimary [facility] SW [facility]ando[facility]Wil[facility]

## 2021-08-25 NOTE — Patient Instructions (Signed)
CARE PLAN ENTRY (see longitudinal plan of care for additional care plan information)  Current Barriers:  Chronic Disease Management support, education, and care coordination needs related to Hypertension, Hyperlipidemia/Hx of CAD, Diabetes, Tobacco Use Disorder, Depression/Anxiety, Osteoporosis, Neuropathy   Hypertension BP Readings from Last 3 Encounters:  08/08/21 138/62  07/17/21 120/70  06/23/21 134/72   Pharmacist Clinical Goal(s): Over the next 90 days, patient will work with PharmD and providers to maintain BP goal <130/80 Current regimen:  Amlodipine 10mg  daily Lisinopril 40mg  daily Metoprolol Succinate 50mg  daily Interventions: Requested patient to check her blood pressure 2 times per week and record Coordinated refill for lisinopril 40mg   Patient self care activities - Over the next 90 days, patient will: Check blood pressure 2 times per week, document, and provide at future appointments Ensure daily salt intake < 2300 mg/day Continue current regimen for blood pressure  Hyperlipidemia / Heart Disease Lab Results  Component Value Date/Time   LDLCALC 57 12/06/2020 04:03 PM   LDLCALC 61 06/15/2020 08:19 AM   LDLDIRECT 53.0 04/24/2021 03:42 PM   Pharmacist Clinical Goal(s): Over the next 90 days, patient will work with PharmD and providers to maintain LDL goal < 70 Current regimen:  Aspirin 81mg  daily Fenofibrate 160mg  daily Omega 3 fish Oil 1030mg  twice daily Atorvastatin 40mg  daily Interventions: Reviewed last lipid panel with patient. Has maintained LDL at goal and Tg improved form 344 in 2021 to 145 on 12/06/2020 Discussed that patient should be taking rosuvastatin vs atorvastatin (This was changed June 2021) Patient self care activities - Over the next 90 days, patient will: Continue current regimen for heart health and cholesterol Continue to follow low fat diet  Diabetes Lab Results  Component Value Date/Time   HGBA1C 8.4 (A) 06/23/2021 01:43 PM   HGBA1C  8.1 (A) 05/10/2021 01:36 PM   HGBA1C 8.4 (H) 04/24/2021 03:42 PM   HGBA1C 8.4 (H) 02/16/2021 09:37 AM   Pharmacist Clinical Goal(s): Over the next 180 days, patient will work with PharmD and providers to achieve A1c goal <7% Current regimen:  Treiba 40 units daily Novolog - 12 units plus additional correction based on scale from Dr Jardiance 25mg  - take 1 tablet each morning Interventions: Discussed DM goals Fasting (before meal blood sugar): 80-130 Post-Prandial (2 hrs after eating blood sugar): less than 180 A1c (3 month blood sugar average): less than 7% Requested patient to use DexCom to monitor blood glucose - will receive next week Coordinated with patient and [employer] program. Patient approved. Has received first shipment Patient self care activities - Over the next 90 days, patient will: Continue to check blood glucose prior to meals and at bedtime Contact provider with any episodes of hypoglycemia You will receive paperwork from [facility] regarding the DexCom Continuous Glucose Monitor. Please contact either endocrinology office or Malan Werk (clinical pharmacist at [facility]) if you have questions.   Depression / anxiety Pharmacist Clinical Goal(s) Over the next 90 days, patient will work with PharmD and providers to reduce symptoms associated with depression Current regimen:  Alprazolam 0.25mg  three times daily as needed  Fluoxetine 60mg  daily Interventions: None today Patient self care activities - Over the next 90 days, patient will: Continue current regimen  Osteoporosis Pharmacist Clinical Goal(s) Over the next 180 days, patient will work with PharmD and providers to reduce risk of fracture due to osteoporosis  Current regimen:  Alendronate 70mg  weekly Interventions: Consider repeat DEXA Scan Patient self care activities - Over the next 180 days,  patient will: Consider repeat DEXA Scan  Shoulder Pain:  Pharmacist Clinical  Goal(s) Over the next 180 days, patient will work with PharmD and providers to improve pain and improve BG control to A1c <8.0 so patient can be scheduled for needed shoulder surgery Current regimen:  Tramadol 50mg  up to 3 times a day if needed for pain Interventions: Discuss importance of taking diabetes meds daily and getting A1c <8.0 so surgery can be scheduled.  Updated medication list to include tramadol and also removed methocarbamol which patient is not longer taking Patient self care activities - Over the next 180 days, patient will: Continue to follow up with orthopedic office for pain management   Medication management Pharmacist Clinical Goal(s): Over the next 90 days, patient will work with PharmD and providers to maintain optimal medication adherence Current pharmacy: [facility] switched to [facility] Interventions Comprehensive medication review performed. Utilize [facility] pharmacy for medication synchronization, packaging and delivery Patient self care activities - Over the next 90 days, patient will: Focus on medication adherence by filling and taking medications appropriately  Take medications as prescribed Report any questions or concerns to PharmD and/or provider(s)  Please see past updates related to this goal by clicking on the "Past Updates" button in the selected goal     The patient verbalized understanding of instructions, educational materials, and care plan provided today and declined offer to receive copy of patient instructions, educational materials, and care plan.   , PharmD Clinical Pharmacist [facility]

## 2021-08-29 LAB — COLOGUARD: COLOGUARD: NEGATIVE

## 2021-09-06 DIAGNOSIS — I10 Essential (primary) hypertension: Secondary | ICD-10-CM

## 2021-09-06 DIAGNOSIS — E785 Hyperlipidemia, unspecified: Secondary | ICD-10-CM

## 2021-09-06 DIAGNOSIS — E1169 Type 2 diabetes mellitus with other specified complication: Secondary | ICD-10-CM | POA: Diagnosis not present

## 2021-09-06 DIAGNOSIS — E1165 Type 2 diabetes mellitus with hyperglycemia: Secondary | ICD-10-CM

## 2021-09-06 DIAGNOSIS — Z794 Long term (current) use of insulin: Secondary | ICD-10-CM

## 2021-09-13 ENCOUNTER — Other Ambulatory Visit: Payer: Self-pay

## 2021-09-13 ENCOUNTER — Ambulatory Visit: Payer: Medicare HMO | Admitting: Internal Medicine

## 2021-09-13 ENCOUNTER — Encounter: Payer: Self-pay | Admitting: Internal Medicine

## 2021-09-13 VITALS — BP 114/68 | HR 72 | Ht 65.0 in | Wt 171.6 lb

## 2021-09-13 DIAGNOSIS — E1165 Type 2 diabetes mellitus with hyperglycemia: Secondary | ICD-10-CM

## 2021-09-13 DIAGNOSIS — Z794 Long term (current) use of insulin: Secondary | ICD-10-CM | POA: Diagnosis not present

## 2021-09-13 DIAGNOSIS — E1159 Type 2 diabetes mellitus with other circulatory complications: Secondary | ICD-10-CM | POA: Diagnosis not present

## 2021-09-13 LAB — GLUCOSE, POCT (MANUAL RESULT ENTRY): POC Glucose: 189 mg/dl — AB (ref 70–99)

## 2021-09-13 LAB — POCT GLYCOSYLATED HEMOGLOBIN (HGB A1C): Hemoglobin A1C: 9 % — AB (ref 4.0–5.6)

## 2021-09-13 MED ORDER — TRESIBA FLEXTOUCH 100 UNIT/ML ~~LOC~~ SOPN: 44.0000 [IU] | PEN_INJECTOR | Freq: Every day | SUBCUTANEOUS | 3 refills | Status: DC

## 2021-09-13 MED ORDER — DEXCOM G6 TRANSMITTER MISC: 1.0000 | 3 refills | Status: DC

## 2021-09-13 MED ORDER — DEXCOM G6 SENSOR MISC: 1.0000 | 3 refills | Status: DC

## 2021-09-13 NOTE — Progress Notes (Signed)
Name: [name]  Age/ Sex: 75 y.o., female   MRN/ DOB: [id number], [birth year]     PCP: Ann Held, DO   Reason for Endocrinology Evaluation: Type 2 Diabetes Mellitus  Initial Endocrine Consultative Visit: 01/17/2021    PATIENT IDENTIFIER: [name] is a 75 y.o. female with a past medical history of HTN, T2Dm, CAD , Osteoporosis and Hx of pancreatitis. The patient has followed with Endocrinology clinic since 4 f/09/2021 or consultative assistance with management of her diabetes.  DIABETIC HISTORY:  [name] was diagnosed with DM in 1998, has intolerance to Metformin due to Gi side effects. Her hemoglobin A1c has ranged from 9.3% in 2015, peaking at 10.1% in 2020.  She has history of Pancreatitis that she believes is caused Metformin   On her initial visit to our clinic she had an A1c of 9.4%  , she was on Jardiance,Glimepiride , Januvia and insulin mix . We stopped Glimepiride as she was not taking it, we stopped Januvia due to hx of pancreatitis. We continued jardiance and adjusted insulin mix     Switched insulin Mix to Basal/prandial insulin 05/2021   SUBJECTIVE:   During the last visit (06/23/2021): A1c 8.4 % . conitnued Jardiance and adjusted insulin dose      Today (09/13/2021): [name] is here for a follow up on diabetes management. She checks her blood sugars 2-3 times daily. The patient has not had hypoglycemic episodes since the last clinic visit.  She did not bring a meter today  Denies nausea or vomiting  or diarrhea   She is waiting to have right shoulder sx but waiting on A1c to be less then 8%   She tends to forget her prandial insulin at times      HOME DIABETES REGIMEN:  Jardiance 25 mg, 1 tablet in the morning Tresiba 40 units ONCE daily  Novolog 12 units with each meal  Correction factor: NovoLog (BG -130/25)      Statin: yes ACE-I/ARB: yes  METER DOWNLOAD SUMMARY: Did not bring     DIABETIC  COMPLICATIONS: Microvascular complications:   Denies: CKD, retinopathy, neuropathy Last Eye Exam: Completed 12/2020  Macrovascular complications:  CAD ( S/P stent placement)  Denies: CVA, PVD   HISTORY:  Past Medical History:  Past Medical History:  Diagnosis Date  . Blockage of coronary artery of heart ([place])   . Depression   . Diabetes mellitus   . Hyperlipidemia   . Hypertension   . Pancreatitis   . Restless leg syndrome    Past Surgical History:  Past Surgical History:  Procedure Laterality Date  . CATARACT EXTRACTION, BILATERAL    . CORONARY ANGIOPLASTY WITH STENT PLACEMENT    . FOOT SURGERY Right    cyst removal  . ROTATOR CUFF REPAIR     right  . ROTATOR CUFF REPAIR Left   . TUBAL LIGATION     Social History:  reports that she quit smoking about 16 months ago. Her smoking use included cigarettes. She smoked an average of .2 packs per day. She has never used smokeless tobacco. She reports that she does not drink alcohol and does not use drugs. Family History:  Family History  Problem Relation Age of Onset  . Heart attack Father   . Heart attack Brother   . Diabetes Sister   . Cirrhosis Sister   . Hypertension Sister   . Cancer Mother   . Hypertension Mother   . Vision loss Mother   .  Cancer Sister   . Sleep apnea Sister   . Heart Problems Sister   . Diabetes Sister   . Gout Sister   . Diabetes Son   . Heart attack Son   . Diabetes Daughter   . Goiter Daughter   . Diabetes Daughter      HOME MEDICATIONS: Allergies as of 09/13/2021       Reactions   Metformin And Related Other (See Comments)   Sick on stomach, sweaty, throw up, pain   Levemir [insulin Detemir] Hives, Swelling   Clindamycin Nausea And Vomiting   Crestor [rosuvastatin Calcium] Palpitations        Medication List        Accurate as of September 13, 2021  2:20 PM. If you have any questions, ask your nurse or doctor.          alendronate 70 MG tablet Commonly known as:  FOSAMAX TAKE ONE TABLET BY MOUTH ONCE WEEKLY ON WEDNESDAY   ALPRAZolam 0.25 MG tablet Commonly known as: XANAX Take 1 tablet by mouth three times a day if needed   amLODipine 10 MG tablet Commonly known as: NORVASC TAKE ONE TABLET BY MOUTH ONCE DAILY   aspirin EC 81 MG tablet Take 81 mg by mouth daily.   atorvastatin 40 MG tablet Commonly known as: LIPITOR Take 1 tablet (40 mg total) by mouth daily.   B-D SINGLE USE SWABS REGULAR Pads USE AS DIRECTED   Dexcom G6 Sensor Misc 1 Device by Does not apply route as directed. Started by: Dorita Sciara, MD   Dexcom G6 Transmitter Misc 1 Device by Does not apply route as directed.   empagliflozin 25 MG Tabs tablet Commonly known as: Jardiance Take 1 tablet (25 mg total) by mouth daily before breakfast.   fenofibrate 160 MG tablet TAKE ONE TABLET BY MOUTH EVERY EVENING   ferrous sulfate 325 (65 FE) MG tablet Take 325 mg by mouth daily with breakfast.   Fish Oil 1000 MG Caps Take by mouth.   FLAXSEED OIL PO Take 1 capsule by mouth 2 (two) times daily.   FLUoxetine 40 MG capsule Commonly known as: PROZAC TAKE ONE CAPSULE BY MOUTH ONCE DAILY   FLUoxetine 20 MG capsule Commonly known as: PROZAC Take 20 mg by mouth daily at 6 (six) AM.   fluticasone 50 MCG/ACT nasal spray Commonly known as: FLONASE SHAKE LIQUID AND USE 2 SPRAYS IN EACH NOSTRIL DAILY   HYDROcodone-acetaminophen 5-325 MG tablet Commonly known as: Norco Take 1 tablet by mouth every 6 (six) hours as needed for moderate pain.   Insulin Pen Needle 32G X 4 MM Misc 1 Device by Does not apply route 4 (four) times daily.   Lancing Device Misc   levocetirizine 5 MG tablet Commonly known as: XYZAL TAKE 1 TABLET(5 MG) BY MOUTH EVERY EVENING   lisinopril 40 MG tablet Commonly known as: ZESTRIL Take 1 tablet (40 mg total) by mouth daily.   meloxicam 7.5 MG tablet Commonly known as: MOBIC Take 1 tablet (7.5 mg total) by mouth daily as needed for  pain.   methocarbamol 500 MG tablet Commonly known as: Robaxin Take 1 tablet (500 mg total) by mouth 2 (two) times daily as needed.   metoprolol succinate 50 MG 24 hr tablet Commonly known as: TOPROL-XL TAKE ONE TABLET BY MOUTH ONCE DAILY WITH FOOD   multivitamin with minerals Tabs tablet Take 1 tablet by mouth daily.   NovoLOG FlexPen 100 UNIT/ML FlexPen Generic drug: insulin aspart Max daily  70 units   Tai Doc Control Normal Soln   THERATEARS OP Place 1 drop into both eyes daily as needed (dry eyes).   traMADol 50 MG tablet Commonly known as: ULTRAM Take 1 tablet (50 mg total) by mouth 2 (two) times daily as needed.   Tyler Aas FlexTouch 100 UNIT/ML FlexTouch Pen Generic drug: insulin degludec Inject 44 Units into the skin daily. What changed: how much to take Changed by: Dorita Sciara, MD   True Metrix Air Glucose Meter w/Device Kit 1 each by Does not apply route 4 (four) times daily. DX CODE E11.8   True Metrix Blood Glucose Test test strip Generic drug: glucose blood USE AS DIRECTED 4 TIMES A DAY   TRUEplus Lancets 33G Misc USE AS DIRECTED 4 TIMES A DAY   vitamin E 180 MG (400 UNITS) capsule Take 400 Units by mouth daily.         OBJECTIVE:   Vital Signs: BP 114/68 (BP Location: Left Arm, Patient Position: Sitting, Cuff Size: Small)   Pulse 72   Ht '5\' 5"'  (1.651 m)   Wt 171 lb 9.6 oz (77.8 kg)   SpO2 96%   BMI 28.56 kg/m   Wt Readings from Last 3 Encounters:  09/13/21 171 lb 9.6 oz (77.8 kg)  08/08/21 171 lb (77.6 kg)  07/17/21 175 lb (79.4 kg)     Exam: General: Pt appears well and is in NAD  Lungs: Clear with good BS bilat with no rales, rhonchi, or wheezes  Heart: RRR with normal S1 and S2 and no gallops; no murmurs; no rub  Extremities: No pretibial edema.   Neuro: MS is good with appropriate affect, pt is alert and Ox3    DM foot exam: 06/23/2021   The skin of the feet is intact without sores or ulcerations. The pedal pulses  are 2+ on right and 2+ on left. The sensation is intact to a screening 5.07, 10 gram monofilament bilaterally       DATA REVIEWED:  Lab Results  Component Value Date   HGBA1C 9.0 (A) 09/13/2021   HGBA1C 8.4 (A) 06/23/2021   HGBA1C 8.1 (A) 05/10/2021   Lab Results  Component Value Date   MICROALBUR 12.2 (H) 12/06/2020   LDLCALC 57 12/06/2020   CREATININE 0.85 07/17/2021   Lab Results  Component Value Date   MICRALBCREAT 13.3 12/06/2020     Lab Results  Component Value Date   CHOL 116 04/24/2021   HDL 28.70 (L) 04/24/2021   LDLCALC 57 12/06/2020   LDLDIRECT 53.0 04/24/2021   TRIG 271.0 (H) 04/24/2021   CHOLHDL 4 04/24/2021       Latest Reference Range & Units 07/17/21 09:12  Sodium 135 - 145 mEq/L 137  Potassium 3.5 - 5.1 mEq/L 4.2  Chloride 96 - 112 mEq/L 103  CO2 19 - 32 mEq/L 26  Glucose 70 - 99 mg/dL 149 (H)  BUN 6 - 23 mg/dL 22  Creatinine 0.40 - 1.20 mg/dL 0.85  Calcium 8.4 - 10.5 mg/dL 9.7  Alkaline Phosphatase 39 - 117 U/L 38 (L)  Albumin 3.5 - 5.2 g/dL 4.3  AST 0 - 37 U/L 18  ALT 0 - 35 U/L 14  Total Protein 6.0 - 8.3 g/dL 6.7  Total Bilirubin 0.2 - 1.2 mg/dL 0.6  GFR >60.00 mL/min 66.97   ASSESSMENT / PLAN / RECOMMENDATIONS:   1) Type 2 Diabetes Mellitus, poorly controlled, With macrovascular - complications - Most recent A1c of 9.0 %. Goal A1c < 7.0 %.       -  Unfortunately patient continues with worsening hyperglycemia despite switching insulin mix to basal/prandial insulin.  The patient admits to dietary indiscretions (got her hands on fruit cakes) she also admits to forgetting to take prandial insulin at times - I have prescribed Dexcom waiting on an updated office note - GLP-1 agonists and DPP-4 inhibitors are contra-indicated due to hx of pancreatitis  -I am going to increase her basal insulin as below -She will continue with prandial dose as well as correction scale if needed  MEDICATIONS: -Continue Jardiance 25 mg, 1 tablet in the  morning  -Increase Tresiba 44 units ONCE daily  -Continue Novolog 12 units with each meal  -Continue correction factor: NovoLog (BG -130/25)     EDUCATION / INSTRUCTIONS: BG monitoring instructions: Patient is instructed to check her blood sugars 3 times a day, before meals. Call [facility] if: BG persistently < 70  I reviewed the Rule of 15 for the treatment of hypoglycemia in detail with the patient. Literature supplied.   2) Diabetic complications:  Eye: Does not have known diabetic retinopathy.  Neuro/ Feet: Does not have known diabetic peripheral neuropathy .  Renal: Patient does not have known baseline CKD. She   is  on an ACEI/ARB at present.      F/U in 4 months     Signed electronically by: Mack Guise, MD  [facility]  [facility] [address] [place], [place] [address] Phone: [phone] FAX: [phone]   CC: [address] [place] [address] Phone: [phone]  Fax: [phone]  Return to Endocrinology clinic as below: Future Appointments  Date Time Provider [place]  09/18/2021 10:40 AM Ann Held, DO [facility]  10/27/2021 11:00 AM [facility] PHARMACIST [facility]  12/13/2021  9:50 AM Emiliya Chretien, Melanie Crazier, MD [facility] None

## 2021-09-13 NOTE — Patient Instructions (Addendum)
-   Continue Jardiance 25 mg, 1 tablet in the morning  - Increase Tresiba 44 units ONCE daily  - Continue Novolog 12 units with each meal  -Novolog correctional insulin: ADD extra units on insulin to your meal-time Novolog dose if your blood sugars are higher than 155. Use the scale below to help guide you:   Blood sugar before meal Number of units to inject  Less than 155 0 unit  156 -  180 1 units  181 -  205 2 units  206 -  230 3 units  231 -  255 4 units  256 -  280 5 units  281 -  305 6 units  306 -  330 7 units  331 -  355 8 units         HOW TO TREAT LOW BLOOD SUGARS (Blood sugar LESS THAN 70 MG/DL) Please follow the RULE OF 15 for the treatment of hypoglycemia treatment (when your (blood sugars are less than 70 mg/dL)   STEP 1: Take 15 grams of carbohydrates when your blood sugar is low, which includes:  3-4 GLUCOSE TABS  OR 3-4 OZ OF JUICE OR REGULAR SODA OR ONE TUBE OF GLUCOSE GEL    STEP 2: RECHECK blood sugar in 15 MINUTES STEP 3: If your blood sugar is still low at the 15 minute recheck --> then, go back to STEP 1 and treat AGAIN with another 15 grams of carbohydrates

## 2021-09-18 ENCOUNTER — Ambulatory Visit (INDEPENDENT_AMBULATORY_CARE_PROVIDER_SITE_OTHER): Payer: Medicare HMO | Admitting: Family Medicine

## 2021-09-18 ENCOUNTER — Encounter: Payer: Self-pay | Admitting: Family Medicine

## 2021-09-18 VITALS — BP 140/70 | HR 65 | Temp 97.5°F | Resp 20 | Ht 65.0 in | Wt 168.8 lb

## 2021-09-18 DIAGNOSIS — E1169 Type 2 diabetes mellitus with other specified complication: Secondary | ICD-10-CM

## 2021-09-18 DIAGNOSIS — M79662 Pain in left lower leg: Secondary | ICD-10-CM | POA: Insufficient documentation

## 2021-09-18 DIAGNOSIS — E785 Hyperlipidemia, unspecified: Secondary | ICD-10-CM | POA: Diagnosis not present

## 2021-09-18 DIAGNOSIS — I10 Essential (primary) hypertension: Secondary | ICD-10-CM | POA: Diagnosis not present

## 2021-09-18 LAB — COMPREHENSIVE METABOLIC PANEL
ALT: 15 U/L (ref 0–35)
AST: 15 U/L (ref 0–37)
Albumin: 4.1 g/dL (ref 3.5–5.2)
Alkaline Phosphatase: 42 U/L (ref 39–117)
BUN: 16 mg/dL (ref 6–23)
CO2: 27 mEq/L (ref 19–32)
Calcium: 9.9 mg/dL (ref 8.4–10.5)
Chloride: 101 mEq/L (ref 96–112)
Creatinine, Ser: 0.75 mg/dL (ref 0.40–1.20)
GFR: 77.73 mL/min (ref >60.00)
Glucose, Bld: 235 mg/dL — ABNORMAL HIGH (ref 70–99)
Potassium: 4 mEq/L (ref 3.5–5.1)
Sodium: 136 mEq/L (ref 135–145)
Total Bilirubin: 0.9 mg/dL (ref 0.2–1.2)
Total Protein: 6.4 g/dL (ref 6.0–8.3)

## 2021-09-18 LAB — LIPID PANEL
Cholesterol: 158 mg/dL (ref 0–200)
HDL: 39.8 mg/dL (ref >39.00)
LDL Cholesterol: 101 mg/dL — ABNORMAL HIGH (ref 0–99)
NonHDL: 118.05
Total CHOL/HDL Ratio: 4
Triglycerides: 83 mg/dL (ref 0.0–149.0)
VLDL: 16.6 mg/dL (ref 0.0–40.0)

## 2021-09-18 NOTE — Assessment & Plan Note (Signed)
Encourage heart healthy diet such as MIND or DASH diet, increase exercise, avoid trans fats, simple carbohydrates and processed foods, consider a krill or fish or flaxseed oil cap daily.  °

## 2021-09-18 NOTE — Assessment & Plan Note (Signed)
Well controlled, no changes to meds. Encouraged heart healthy diet such as the DASH diet and exercise as tolerated.  °

## 2021-09-18 NOTE — Patient Instructions (Signed)

## 2021-09-18 NOTE — Assessment & Plan Note (Signed)
Check art [name] Consider vascular / ortho

## 2021-09-18 NOTE — Progress Notes (Signed)
Subjective:   By signing my name below, I, [name], attest that this documentation has been prepared under the direction and in the presence of Ann Held, DO. 09/18/2021   Patient ID: [name], female    DOB: Jun 02, 1946, 75 y.o.   MRN: [id number]  Chief Complaint  Patient presents with   Hyperlipidemia   Follow-up    HPI Patient is in today for an office visit and 2 month f/u.  She will be checking her cholesterol levels today.   She reports there is a lump on her left calf that hurts occasionally. Notes that she cannot sleep on that side. Sometimes, the pain radiates down to her toes and there is a weird sensation in her left leg. Denies pain when walking.  Had 2 ultrasounds and was negative for clots. She does not wear compression stockings.   She has not received her DexCom system.  Past Medical History:  Diagnosis Date   Blockage of coronary artery of heart (HCC)    Depression    Diabetes mellitus    Hyperlipidemia    Hypertension    Pancreatitis    Restless leg syndrome     Past Surgical History:  Procedure Laterality Date   CATARACT EXTRACTION, BILATERAL     CORONARY ANGIOPLASTY WITH STENT PLACEMENT     FOOT SURGERY Right    cyst removal   ROTATOR CUFF REPAIR     right   ROTATOR CUFF REPAIR Left    TUBAL LIGATION      Family History  Problem Relation Age of Onset   Heart attack Father    Heart attack Brother    Diabetes Sister    Cirrhosis Sister    Hypertension Sister    Cancer Mother    Hypertension Mother    Vision loss Mother    Cancer Sister    Sleep apnea Sister    Heart Problems Sister    Diabetes Sister    Gout Sister    Diabetes Son    Heart attack Son    Diabetes Daughter    Goiter Daughter    Diabetes Daughter     Social History   Socioeconomic History   Marital status: Married    Spouse name: Not on file   Number of children: Not on file   Years of education: Not on file   Highest education level: Not  on file  Occupational History   Not on file  Tobacco Use   Smoking status: Former    Packs/day: 0.20    Types: Cigarettes    Quit date: 05/05/2020    Years since quitting: 1.3   Smokeless tobacco: Never   Tobacco comments:    quit in 2021  Vaping Use   Vaping Use: Never used  Substance and Sexual Activity   Alcohol use: No   Drug use: No   Sexual activity: Not on file  Other Topics Concern   Not on file  Social History Narrative   Exercise: seldom   Caffeine use: seldom   Social Determinants of Health   Financial Resource Strain: Medium Risk   Difficulty of Paying Living Expenses: Somewhat hard  Food Insecurity: Not on file  Transportation Needs: Not on file  Physical Activity: Inactive   Days of Exercise per Week: 0 days   Minutes of Exercise per Session: 0 min  Stress: Not on file  Social Connections: Not on file  Intimate Partner Violence: Not on file  Outpatient Medications Prior to Visit  Medication Sig Dispense Refill   Alcohol Swabs (B-D SINGLE USE SWABS REGULAR) PADS USE AS DIRECTED 400 each 1   alendronate (FOSAMAX) 70 MG tablet TAKE ONE TABLET BY MOUTH ONCE WEEKLY ON WEDNESDAY 12 tablet 3   ALPRAZolam (XANAX) 0.25 MG tablet Take 1 tablet by mouth three times a day if needed 60 tablet 0   amLODipine (NORVASC) 10 MG tablet TAKE ONE TABLET BY MOUTH ONCE DAILY 90 tablet 1   aspirin EC 81 MG tablet Take 81 mg by mouth daily.     atorvastatin (LIPITOR) 40 MG tablet Take 1 tablet (40 mg total) by mouth daily. 90 tablet 3   Blood Glucose Calibration (TAI DOC CONTROL) NORMAL SOLN      Blood Glucose Monitoring Suppl (TRUE METRIX AIR GLUCOSE METER) w/Device KIT 1 each by Does not apply route 4 (four) times daily. DX CODE E11.8 1 kit 0   Carboxymethylcellulose Sodium (THERATEARS OP) Place 1 drop into both eyes daily as needed (dry eyes).     Continuous Blood Gluc Sensor (DEXCOM G6 SENSOR) MISC 1 Device by Does not apply route as directed. 9 each 3   Continuous Blood  Gluc Transmit (DEXCOM G6 TRANSMITTER) MISC 1 Device by Does not apply route as directed. 1 each 3   empagliflozin (JARDIANCE) 25 MG TABS tablet Take 1 tablet (25 mg total) by mouth daily before breakfast. 90 tablet 3   fenofibrate 160 MG tablet TAKE ONE TABLET BY MOUTH EVERY EVENING 90 tablet 1   ferrous sulfate 325 (65 FE) MG tablet Take 325 mg by mouth daily with breakfast.      Flaxseed, Linseed, (FLAXSEED OIL PO) Take 1 capsule by mouth 2 (two) times daily.      FLUoxetine (PROZAC) 20 MG capsule Take 20 mg by mouth daily at 6 (six) AM.     FLUoxetine (PROZAC) 40 MG capsule TAKE ONE CAPSULE BY MOUTH ONCE DAILY 90 capsule 1   fluticasone (FLONASE) 50 MCG/ACT nasal spray SHAKE LIQUID AND USE 2 SPRAYS IN EACH NOSTRIL DAILY 48 g 1   glucose blood (TRUE METRIX BLOOD GLUCOSE TEST) test strip USE AS DIRECTED 4 TIMES A DAY 400 strip 1   HYDROcodone-acetaminophen (NORCO) 5-325 MG tablet Take 1 tablet by mouth every 6 (six) hours as needed for moderate pain. 30 tablet 0   insulin aspart (NOVOLOG FLEXPEN) 100 UNIT/ML FlexPen Max daily 70 units 30 mL 6   insulin degludec (TRESIBA FLEXTOUCH) 100 UNIT/ML FlexTouch Pen Inject 44 Units into the skin daily. 45 mL 3   Insulin Pen Needle 32G X 4 MM MISC 1 Device by Does not apply route 4 (four) times daily. 400 each 3   Lancet Devices (LANCING DEVICE) MISC      levocetirizine (XYZAL) 5 MG tablet TAKE 1 TABLET(5 MG) BY MOUTH EVERY EVENING 90 tablet 1   lisinopril (ZESTRIL) 40 MG tablet Take 1 tablet (40 mg total) by mouth daily. 90 tablet 0   meloxicam (MOBIC) 7.5 MG tablet Take 1 tablet (7.5 mg total) by mouth daily as needed for pain. 30 tablet 0   methocarbamol (ROBAXIN) 500 MG tablet Take 1 tablet (500 mg total) by mouth 2 (two) times daily as needed. 20 tablet 0   metoprolol succinate (TOPROL-XL) 50 MG 24 hr tablet TAKE ONE TABLET BY MOUTH ONCE DAILY WITH FOOD 90 tablet 1   Multiple Vitamin (MULTIVITAMIN WITH MINERALS) TABS Take 1 tablet by mouth daily.      Omega-3 Fatty  Acids (FISH OIL) 1000 MG CAPS Take by mouth.     traMADol (ULTRAM) 50 MG tablet Take 1 tablet (50 mg total) by mouth 2 (two) times daily as needed. 60 tablet 2   TRUEplus Lancets 33G MISC USE AS DIRECTED 4 TIMES A DAY 400 each 1   vitamin E 400 UNIT capsule Take 400 Units by mouth daily.     No facility-administered medications prior to visit.    Allergies  Allergen Reactions   Metformin And Related Other (See Comments)    Sick on stomach, sweaty, throw up, pain   Levemir [Insulin Detemir] Hives and Swelling   Clindamycin Nausea And Vomiting   Crestor [Rosuvastatin Calcium] Palpitations    Review of Systems  Constitutional:  Negative for fever.  HENT:  Negative for congestion, ear pain, hearing loss, sinus pain and sore throat.   Eyes:  Negative for blurred vision and pain.  Respiratory:  Negative for cough, sputum production, shortness of breath and wheezing.   Cardiovascular:  Negative for chest pain and palpitations.  Gastrointestinal:  Negative for blood in stool, constipation, diarrhea, nausea and vomiting.  Genitourinary:  Negative for dysuria, frequency, hematuria and urgency.  Musculoskeletal:  Positive for myalgias (left leg). Negative for back pain and falls.  Neurological:  Negative for dizziness, sensory change, loss of consciousness, weakness and headaches.  Endo/Heme/Allergies:  Negative for environmental allergies. Does not bruise/bleed easily.  Psychiatric/Behavioral:  Negative for depression and suicidal ideas. The patient is not nervous/anxious and does not have insomnia.       Objective:    Physical Exam Constitutional:      General: She is not in acute distress.    Appearance: Normal appearance. She is not ill-appearing.  HENT:     Head: Normocephalic and atraumatic.     Right Ear: External ear normal.     Left Ear: External ear normal.  Eyes:     Extraocular Movements: Extraocular movements intact.     Pupils: Pupils are equal, round, and  reactive to light.  Cardiovascular:     Rate and Rhythm: Normal rate and regular rhythm.     Pulses: Normal pulses.     Heart sounds: Normal heart sounds. No murmur heard.   No gallop.  Pulmonary:     Effort: Pulmonary effort is normal. No respiratory distress.     Breath sounds: Normal breath sounds. No wheezing, rhonchi or rales.  Abdominal:     General: Bowel sounds are normal. There is no distension.     Palpations: Abdomen is soft. There is no mass.     Tenderness: There is no abdominal tenderness. There is no guarding or rebound.     Hernia: No hernia is present.  Musculoskeletal:     Cervical back: Normal range of motion and neck supple.  Lymphadenopathy:     Cervical: No cervical adenopathy.  Skin:    General: Skin is warm and dry.  Neurological:     Mental Status: She is alert and oriented to person, place, and time.  Psychiatric:        Behavior: Behavior normal.    BP 140/70 (BP Location: Left Arm, Patient Position: Sitting, Cuff Size: Normal)   Pulse 65   Temp (!) 97.5 F (36.4 C) (Oral)   Resp 20   Ht '5\' 5"'  (1.651 m)   Wt 168 lb 12.8 oz (76.6 kg)   SpO2 93%   BMI 28.09 kg/m  Wt Readings from Last 3 Encounters:  09/18/21 168 lb 12.8  oz (76.6 kg)  09/13/21 171 lb 9.6 oz (77.8 kg)  08/08/21 171 lb (77.6 kg)    Diabetic Foot Exam - Simple   No data filed    Lab Results  Component Value Date   WBC 7.0 12/06/2020   HGB 12.2 12/06/2020   HCT 36.6 12/06/2020   PLT 182.0 12/06/2020   GLUCOSE 149 (H) 07/17/2021   CHOL 116 04/24/2021   TRIG 271.0 (H) 04/24/2021   HDL 28.70 (L) 04/24/2021   LDLDIRECT 53.0 04/24/2021   LDLCALC 57 12/06/2020   ALT 14 07/17/2021   AST 18 07/17/2021   NA 137 07/17/2021   K 4.2 07/17/2021   CL 103 07/17/2021   CREATININE 0.85 07/17/2021   BUN 22 07/17/2021   CO2 26 07/17/2021   TSH 0.58 08/11/2018   INR 1.1 03/03/2009   HGBA1C 9.0 (A) 09/13/2021   MICROALBUR 12.2 (H) 12/06/2020    Lab Results  Component Value Date    TSH 0.58 08/11/2018   Lab Results  Component Value Date   WBC 7.0 12/06/2020   HGB 12.2 12/06/2020   HCT 36.6 12/06/2020   MCV 87.5 12/06/2020   PLT 182.0 12/06/2020   Lab Results  Component Value Date   NA 137 07/17/2021   K 4.2 07/17/2021   CO2 26 07/17/2021   GLUCOSE 149 (H) 07/17/2021   BUN 22 07/17/2021   CREATININE 0.85 07/17/2021   BILITOT 0.6 07/17/2021   ALKPHOS 38 (L) 07/17/2021   AST 18 07/17/2021   ALT 14 07/17/2021   PROT 6.7 07/17/2021   ALBUMIN 4.3 07/17/2021   CALCIUM 9.7 07/17/2021   ANIONGAP 13 09/23/2020   GFR 66.97 07/17/2021   Lab Results  Component Value Date   CHOL 116 04/24/2021   Lab Results  Component Value Date   HDL 28.70 (L) 04/24/2021   Lab Results  Component Value Date   LDLCALC 57 12/06/2020   Lab Results  Component Value Date   TRIG 271.0 (H) 04/24/2021   Lab Results  Component Value Date   CHOLHDL 4 04/24/2021   Lab Results  Component Value Date   HGBA1C 9.0 (A) 09/13/2021       Assessment & Plan:   Problem List Items Addressed This Visit       Unprioritized   Hyperlipidemia associated with type 2 diabetes mellitus ([place]) - Primary   Relevant Orders   Lipid panel   Comprehensive metabolic panel   Essential hypertension    Well controlled, no changes to meds. Encouraged heart healthy diet such as the DASH diet and exercise as tolerated.       Hyperlipidemia LDL goal <100    Encourage heart healthy diet such as MIND or DASH diet, increase exercise, avoid trans fats, simple carbohydrates and processed foods, consider a krill or fish or flaxseed oil cap daily.       Hyperlipidemia LDL goal <70    Encourage heart healthy diet such as MIND or DASH diet, increase exercise, avoid trans fats, simple carbohydrates and processed foods, consider a krill or fish or flaxseed oil cap daily.       Pain of left calf    Check art [name] Consider vascular / ortho      Relevant Orders   [name] Lower Ext Art Bilat   Primary  hypertension     No orders of the defined types were placed in this encounter.   I,[name],acting as a Education administrator for [employer], DO.,have documented all relevant documentation  on the behalf of Ann Held, DO,as directed by  Ann Held, DO while in the presence of Ann Held, [place], DO., personally preformed the services described in this documentation.  All medical record entries made by the scribe were at my direction and in my presence.  I have reviewed the chart and discharge instructions (if applicable) and agree that the record reflects my personal performance and is accurate and complete. 09/18/2021

## 2021-09-26 ENCOUNTER — Other Ambulatory Visit: Payer: Self-pay | Admitting: Family Medicine

## 2021-09-26 DIAGNOSIS — M79604 Pain in right leg: Secondary | ICD-10-CM

## 2021-10-10 ENCOUNTER — Other Ambulatory Visit: Payer: Self-pay | Admitting: Family Medicine

## 2021-10-10 DIAGNOSIS — M79604 Pain in right leg: Secondary | ICD-10-CM

## 2021-10-20 DIAGNOSIS — E1165 Type 2 diabetes mellitus with hyperglycemia: Secondary | ICD-10-CM | POA: Diagnosis not present

## 2021-10-24 ENCOUNTER — Ambulatory Visit (HOSPITAL_COMMUNITY)
Admission: RE | Admit: 2021-10-24 | Payer: Medicare HMO | Source: Ambulatory Visit | Attending: Family Medicine | Admitting: Family Medicine

## 2021-10-25 ENCOUNTER — Telehealth: Payer: Medicare HMO

## 2021-10-26 ENCOUNTER — Telehealth: Payer: Medicare HMO

## 2021-10-27 ENCOUNTER — Telehealth: Payer: Medicare HMO

## 2021-11-02 ENCOUNTER — Other Ambulatory Visit: Payer: Self-pay | Admitting: Pharmacist

## 2021-11-02 ENCOUNTER — Other Ambulatory Visit: Payer: Self-pay | Admitting: Family Medicine

## 2021-11-02 DIAGNOSIS — I10 Essential (primary) hypertension: Secondary | ICD-10-CM

## 2021-11-02 DIAGNOSIS — E785 Hyperlipidemia, unspecified: Secondary | ICD-10-CM

## 2021-11-07 ENCOUNTER — Other Ambulatory Visit: Payer: Self-pay

## 2021-11-07 ENCOUNTER — Ambulatory Visit (HOSPITAL_COMMUNITY)
Admission: RE | Admit: 2021-11-07 | Discharge: 2021-11-07 | Disposition: A | Discharge status: Home / Self Care | Payer: Medicare HMO | Source: Ambulatory Visit | Attending: Cardiovascular Disease | Admitting: Cardiovascular Disease

## 2021-11-07 DIAGNOSIS — M79605 Pain in left leg: Secondary | ICD-10-CM | POA: Insufficient documentation

## 2021-11-07 DIAGNOSIS — M79604 Pain in right leg: Secondary | ICD-10-CM | POA: Diagnosis not present

## 2021-11-08 ENCOUNTER — Ambulatory Visit (INDEPENDENT_AMBULATORY_CARE_PROVIDER_SITE_OTHER): Payer: Medicare HMO | Admitting: Pharmacist

## 2021-11-08 DIAGNOSIS — E1169 Type 2 diabetes mellitus with other specified complication: Secondary | ICD-10-CM

## 2021-11-08 DIAGNOSIS — E1165 Type 2 diabetes mellitus with hyperglycemia: Secondary | ICD-10-CM

## 2021-11-08 DIAGNOSIS — I10 Essential (primary) hypertension: Secondary | ICD-10-CM

## 2021-11-08 DIAGNOSIS — E785 Hyperlipidemia, unspecified: Secondary | ICD-10-CM

## 2021-11-08 MED ORDER — LEVOCETIRIZINE DIHYDROCHLORIDE 5 MG PO TABS: ORAL_TABLET | ORAL | 1 refills | Status: DC

## 2021-11-08 NOTE — Patient Instructions (Signed)
[name] It was a pleasure speaking with you today.  I have attached a summary of our visit today and information about your health goals.   Completed enclosed applications for Jardiance and insulin Notify clinical pharmacist once you receive letter from social security about extra help / LIS application.   If you have any questions or concerns, please feel free to contact me either at the phone number below or with a MyChart message.   Keep up the good work!  [name], PharmD Clinical Pharmacist [facility] [phone] (direct line)  [phone] (main office number)    Hypertension Controlled; BP goal <130/80 Patient checks BP at home infrequently - usually once per week  Patient has failed these meds in the past: None noted  Current regimen:  Amlodipine 10mg  daily Lisinopril 40mg  daily Metoprolol Succinate 50mg  daily Interventions: (addressed at previous visit)  Requested patient to check her blood pressure 2 times per week and record Ensure daily salt intake < 2300 mg/day Continue current regimen for blood pressure  Hyperlipidemia / CAD Controlled; LDL goal < 70 Current regimen:  Aspirin 81mg  daily Fenofibrate 160mg  daily Omega 3 fish Oil 1030mg  twice daily Atorvastatin 40mg  daily (restarted 07/17/2021) Previous statin tried: rosuvastatin 10mg  daily - caused palpitation Interventions: Continue current regimen for heart health and cholesterol Continue to follow low fat diet  Diabetes Lab Results  Component Value Date   HGBA1C 8.4 (A) 06/23/2021   HGBA1C 8.1 (A) 05/10/2021   HGBA1C 8.4 (H) 04/24/2021    A1c not at goal. Initial goal is to get A1c <8.0 so patient can have shoulder surgery but eventually would like A1c goal <7% as long as doesn't increase hypoglycemia Followed by Dr - last appt was 06/23/2021 Current regimen:  Novolog 12 units with each meal + additional correction based on blood glucose (max  of 70 units per day) 44 units daily Jardiance 25mg  - take 1 tablet each morning Past medications - Januvia 100mg  daily and glimepiride 2mg  qam (discontinue by Dr 09/16/2021 2022); Novoloin 70/30 insulin - changed 06/2021 due to low efficacy History of pancreatitis - so no GLP1s or DPP4s Denies hypoglycemia or blood glucose <80  Received PAP for Jardiance in 2022. She has enough Jardiance for 4 weeks.  Previously Dr Lonzo Cloud has prescribed DexCom but initially was denied because patient was only injecting insulin twice a day. Has been resubmitted with new insulin regimen and approved. Patient states she has not received DexCom yet.  Interventions: Applied for LIS for 2023 as denial usually required by [facility] for Jardiance patient assistance program. Mailed Jardiance patient assistance program to patient  [facility] hold size = 4 - patient, spouse and 2 grandchildren) Mailed patient assistance program for and Novolog to patient Reviewed insulin regimen, specifically bolus insulin dosing Discussed DM goals Fasting (before meal blood sugar): 80-130 Post-Prandial (2 hrs after eating blood sugar): less than 180 A1c (3 month blood sugar average): less than 7%  Depression / anxiety Patient reports anxiety and depression has improved some with increase dose of fluoxetine earlier this month Patient has failed these meds in past: sertraline (palpitations) Current regimen:  Alprazolam 0.25mg  three times daily as needed  Fluoxetine 60mg  daily Interventions:  Continue current medications Updated EMR with current fluoxetine dose of 3 capsules of 20mg  (instead of 1-20mg  + 1-40mg )  Osteoporosis  Last DEXA Scan: 05/29/2017             T-Score femoral neck: -2.0  T-Score forearm radius: -2.5 Current regimen:  Alendronate 70mg  weekly Interventions: (addressed at previous visit) Consider repeat DEXA Scan Continue current regimen  Shoulder Pain / osteoarthritis of right knee:   Surgery is planned once A1c is < 8.0% Current regimen:  Tramadol 50mg  up to 3 times a day if needed for pain Interventions: (addressed at previous visit) Discuss importance of taking diabetes meds daily and getting A1c <8.0 so surgery can be scheduled.  Continue to follow up with orthopedic office for pain management   Medication management Pharmacist Clinical Goal(s): Over the next 90 days, patient will work with PharmD and providers to maintain optimal medication adherence Current pharmacy: [facility] Interventions Comprehensive medication review performed. Continue with pharmacy medication synchronization, packaging and delivery - Last fill / deliver 11/06/2021 Coordinated refill for levo cetirizine to pharmacy.   Patient Goals/Self-Care Activities Over the next 90 days, patient will:  take medications as prescribed, check glucose with CGM, document, and provide at future appointments, and collaborate with provider on medication access solutions  Follow Up Plan: Telephone follow up appointment with care management team member scheduled for:  4 weeks with clinical pharmacist The patient verbalized understanding of instructions, educational materials, and care plan provided today and agreed to receive a mailed copy of patient instructions, educational materials, and care plan.

## 2021-11-09 NOTE — Chronic Care Management (AMB) (Signed)
Chronic Care Management Pharmacy Note  11/09/2021 Name:  [name] MRN:  [id number] DOB:  10/25/1945  Summary:  Assisted patient in applying for low income subsidy with Medicare. If denied will apply for Jardiance and Tresiba / Novolog patient assistance program. Mailed application for patient assistance program to patient.  Coordinated with pharmacy for updated medications and ensured that lisinopril would be added to adherence packaging.   Subjective: [name] is an 76 y.o. year old female who is a primary patient of Ann Held, DO.  The CCM team was consulted for assistance with disease management and care coordination needs.    Engaged with patient by telephone for  to follow up type 2 DM and chronic conditions  in response to provider referral for pharmacy case management and/or care coordination services.   Consent to Services:  The patient was given information about Chronic Care Management services, agreed to services, and gave verbal consent prior to initiation of services.  Please see initial visit note for detailed documentation.   Patient Care Team: Carollee Herter, Alferd Apa, DO as PCP - General Buford Dresser, MD as PCP - Cardiology (Cardiology) Ashok Pall, MD as Consulting Physician (Neurosurgery) Monna Fam, MD as Consulting Physician (Ophthalmology) Cherre Robins, RPH-CPP (Pharmacist) [facility], Melanie Crazier, MD as Consulting Physician (Endocrinology)  Recent office visits: 09/18/2021 - Fam Med (Dr Etter SjogrenCheri Rous) Follow up hyperlipidemia. No med changes.  07/17/2021 - Fam Med (Dr Etter Sjogren) F/U depression and anxiety. Using alprazolam less since fluoxetine started. Changed rosuvastatin 32m to atorvastatin 435mbecuase patient felt rosuvastatin was causing palpitations at night. Ultrasound of lower extremities ordered - no DVT noted.  06/20/2021 - PCP (Dr LoEtter SjogrenF/U depression and anxiety. Increased fluoxetine to 6041maily.  Refilled alprazolam 0.64m8meceived flu vaccine 06/02/25/2022 - Phone call to PCP office. Patient feels fluoxetine and alprazolam not working. Asked for increase in dose. PCP recommended OV or video visit to discuss. Unable to reach patient to schedule appt.  Recent consult visits: 09/13/2021 - Endo (Dr ShamKelton PillarU diabetes. A1c 9.0% increased Tresiba to 44 units daily. -Continue Jardiance 25 mg, 1 tablet in the morning -Continue Novolog 12 units with each meal and correction factor: NovoLog (BG -130/25) 08/08/2021 - GI (Dr JacoArdis Hughsen for colon cancer screening. Discussed the risks and benefits to screening via colonoscopy versus screening via Cologuard stool based test.  She decided to go ahead with Cologuard stool based test 06/23/2021 - Endo (Dr ShamKelton PillarU type 2 DM, insuiln dependent. Trying to get A1c < 8.0% for shoulder surgery. Changed Novolin 70/30 Mix to basal / prandial regimen. Tresiba 40 units once daily. Start Novolog 12 units with each meal and also use correction factor. Continue Jardiance 64mg60mh morning. 05/10/2021 - Endo (Dr ShamlKelton Pillar DM with h/o pancreatitis. A1c trending down - was 8.1%. Increaesd Novolin Mix to 46 units before breakfast and 40 units before evening meal. Prescribed DexCom 05/10/2021 - Ortho Surgery (Dr Xu) FErlinda Hong back pain. Recommended epidural steroid but patient concerned about increasing BG. Instead referral place for neurosurgery.    Hospital visits: 05/04/2021 - ED Visit for lumbar radiculopathy. Prescribed predinsone 20mg 21mr for 6 days and robaxin 500mg e88m 8 hours. Also told to take OTC NSAIDs prn. Objective:  Lab Results  Component Value Date   CREATININE 0.75 09/18/2021   CREATININE 0.85 07/17/2021   CREATININE 0.94 04/24/2021    Lab Results  Component Value Date   HGBA1C 9.0 (A) 09/13/2021   Last diabetic  Eye exam:  Lab Results  Component Value Date/Time   HMDIABEYEEXA Retinopathy (A) 12/30/2020 12:00 AM    Last  diabetic Foot exam: No results found for: HMDIABFOOTEX  - Done 12/29/2020 by Dr Kelton Pillar.     Component Value Date/Time   CHOL 158 09/18/2021 1112   TRIG 83.0 09/18/2021 1112   HDL 39.80 09/18/2021 1112   CHOLHDL 4 09/18/2021 1112   VLDL 16.6 09/18/2021 1112   LDLCALC 101 (H) 09/18/2021 1112   LDLCALC 61 06/15/2020 0819   LDLDIRECT 53.0 04/24/2021 1542    Hepatic Function Latest Ref Rng & Units 09/18/2021 07/17/2021 04/24/2021  Total Protein 6.0 - 8.3 g/dL 6.4 6.7 6.7  Albumin 3.5 - 5.2 g/dL 4.1 4.3 4.4  AST 0 - 37 U/L '15 18 21  ' ALT 0 - 35 U/L '15 14 20  ' Alk Phosphatase 39 - 117 U/L 42 38(L) 35(L)  Total Bilirubin 0.2 - 1.2 mg/dL 0.9 0.6 0.4  Bilirubin, Direct 0.0 - 0.3 mg/dL - - -    Lab Results  Component Value Date/Time   TSH 0.58 08/11/2018 02:34 PM   TSH 0.57 07/16/2018 09:50 AM   FREET4 0.74 08/11/2018 02:34 PM   FREET4 0.84 04/16/2013 11:19 AM    CBC Latest Ref Rng & Units 12/06/2020 09/23/2020 08/11/2018  WBC 4.0 - 10.5 K/uL 7.0 13.9(H) 11.2(H)  Hemoglobin 12.0 - 15.0 g/dL 12.2 13.5 12.3  Hematocrit 36.0 - 46.0 % 36.6 40.1 36.9  Platelets 150.0 - 400.0 K/uL 182.0 215 221.0    No results found for: VD25OH  Clinical ASCVD: Yes  The 10-year ASCVD risk score (Arnett DK, et al., 2019) is: 27.5%   Values used to calculate the score:     Age: 18 years     Sex: Female     Is Non-Hispanic African American: Yes     Diabetic: Yes     Tobacco smoker: No     Systolic Blood Pressure: 888 mmHg     Is BP treated: Yes     HDL Cholesterol: 39.8 mg/dL     Total Cholesterol: 158 mg/dL    Other: DEXA 05/29/2017 Forearm Radius 33% T-score = -2.5.  DualFemur Neck Left  T-Score = -2.0; Which was a statistically significant increase in BMD of the left hip since prior exam dated 12/17/2013.  Social History   Tobacco Use  Smoking Status Former   Packs/day: 0.20   Types: Cigarettes   Quit date: 05/05/2020   Years since quitting: 1.5  Smokeless Tobacco Never  Tobacco  Comments   quit in 2021   BP Readings from Last 3 Encounters:  09/18/21 140/70  09/13/21 114/68  08/08/21 138/62   Pulse Readings from Last 3 Encounters:  09/18/21 65  09/13/21 72  08/08/21 64   Wt Readings from Last 3 Encounters:  09/18/21 168 lb 12.8 oz (76.6 kg)  09/13/21 171 lb 9.6 oz (77.8 kg)  08/08/21 171 lb (77.6 kg)    Assessment: Review of patient past medical history, allergies, medications, health status, including review of consultants reports, laboratory and other test data, was performed as part of comprehensive evaluation and provision of chronic care management services.   SDOH:  (Social Determinants of Health) assessments and interventions performed:  SDOH Interventions    Flowsheet Row Most Recent Value  SDOH Interventions   Financial Strain Interventions Other (Comment)  [applied for LIS today. Mailed patient applications for Jardiance and Insuiln PAP.]  Transportation Interventions Intervention Not Indicated  CCM Care Plan  Allergies  Allergen Reactions   Metformin And Related Other (See Comments)    Sick on stomach, sweaty, throw up, pain   Levemir [Insulin Detemir] Hives and Swelling   Clindamycin Nausea And Vomiting   Crestor [Rosuvastatin Calcium] Palpitations    Medications Reviewed Today     Reviewed by Cherre Robins, RPH-CPP (Pharmacist) on 11/08/21 at 1055  Med List Status: <None>   Medication Order Taking? Sig Documenting Provider Last Dose Status Informant  Alcohol Swabs (B-D SINGLE USE SWABS REGULAR) PADS [id number] Yes USE AS DIRECTED Carollee Herter, Alferd Apa, DO Taking Active   alendronate (FOSAMAX) 70 MG tablet [id number] Yes TAKE ONE TABLET BY MOUTH ONCE WEEKLY ON WEDNESDAY Ann Held, DO Taking Active   ALPRAZolam Duanne Moron) 0.25 MG tablet [id number] Yes Take 1 tablet by mouth three times a day if needed Ann Held, DO Taking Active   amLODipine (NORVASC) 10 MG tablet [id number] Yes TAKE ONE TABLET BY MOUTH  ONCE DAILY Ann Held, DO Taking Active   aspirin EC 81 MG tablet [id number] Yes Take 81 mg by mouth daily. [provider] Taking Active Self  atorvastatin (LIPITOR) 40 MG tablet [id number] Yes Take 1 tablet (40 mg total) by mouth daily. Roma Schanz R, DO Taking Active   Blood Glucose Calibration (TAI [facility] CONTROL) NORMAL SOLN [id number] Yes  [provider] Taking Active Self  Blood Glucose Monitoring Suppl (TRUE METRIX AIR GLUCOSE METER) w/Device KIT [id number] Yes 1 each by Does not apply route 4 (four) times daily. DX CODE E11.8 Ann Held, DO Taking Active   Carboxymethylcellulose Sodium (THERATEARS OP) [id number] Yes Place 1 drop into both eyes daily as needed (dry eyes). [provider] Taking Active Self  Continuous Blood Gluc Sensor (DEXCOM G6 SENSOR) MISC [id number] Yes 1 Device by Does not apply route as directed. Shamleffer, Melanie Crazier, MD Taking Active   Continuous Blood Gluc Transmit (DEXCOM G6 TRANSMITTER) MISC [id number] Yes 1 Device by Does not apply route as directed. Shamleffer, Melanie Crazier, MD Taking Active   empagliflozin (JARDIANCE) 25 MG TABS tablet [id number] Yes Take 1 tablet (25 mg total) by mouth daily before breakfast. Shamleffer, Melanie Crazier, MD Taking Active   fenofibrate 160 MG tablet [id number] Yes TAKE ONE TABLET BY MOUTH EVERY EVENING Ann Held, DO Taking Active   ferrous sulfate 325 (65 FE) MG tablet [id number] Yes Take 325 mg by mouth daily with breakfast.  [provider] Taking Active Self  Flaxseed, Linseed, (FLAXSEED OIL PO) [id number] Yes Take 1 capsule by mouth 2 (two) times daily.  [provider] Taking Active Self  FLUoxetine (PROZAC) 20 MG capsule [id number] Yes Take 60 mg by mouth daily at 6 (six) AM. [provider] Taking Active   fluticasone (FLONASE) 50 MCG/ACT nasal spray [id number] Yes SHAKE LIQUID AND USE 2 SPRAYS IN [facility] NOSTRIL DAILY Roma Schanz  R, DO Taking Active   glucose blood (TRUE METRIX BLOOD GLUCOSE TEST) test strip [id number] Yes USE AS DIRECTED 4 TIMES A DAY Carollee Herter, Yvonne R, DO Taking Active   HYDROcodone-acetaminophen (NORCO) 5-325 MG tablet [id number] Yes Take 1 tablet by mouth every 6 (six) hours as needed for moderate pain. Roma Schanz R, DO Taking Active   insulin aspart (NOVOLOG FLEXPEN) 100 UNIT/ML FlexPen [id number] Yes Max daily 70 units Shamleffer, Melanie Crazier, MD Taking Active   insulin degludec (TRESIBA FLEXTOUCH) 100 UNIT/ML FlexTouch Pen [id number] Yes Inject 44 Units  into the skin daily. Shamleffer, Melanie Crazier, MD Taking Active   Insulin Pen Needle 32G X 4 MM MISC [id number] Yes 1 Device by Does not apply route 4 (four) times daily. Shamleffer, Melanie Crazier, MD Taking Active   Lancet Devices (LANCING DEVICE) [place] [id number] Yes  [provider] Taking Active Self  levocetirizine (XYZAL) 5 MG tablet [id number] Yes TAKE 1 TABLET(5 MG) BY MOUTH EVERY EVENING Roma Schanz R, DO Taking Active   lisinopril (ZESTRIL) 40 MG tablet [id number] Yes Take 1 tablet (40 mg total) by mouth daily. Ann Held, DO Taking Active   meloxicam (MOBIC) 7.5 MG tablet [id number] Yes Take 1 tablet (7.5 mg total) by mouth daily as needed for pain. Ann Held, DO Taking Active   methocarbamol (ROBAXIN) 500 MG tablet [id number] Yes Take 1 tablet (500 mg total) by mouth 2 (two) times daily as needed. Aundra Dubin, PA-C Taking Active   metoprolol succinate (TOPROL-XL) 50 MG 24 hr tablet [id number] Yes TAKE ONE TABLET BY MOUTH ONCE DAILY WITH FOOD Carollee Herter, Alferd Apa, DO Taking Active   Multiple Vitamin (MULTIVITAMIN WITH MINERALS) TABS [id number] Yes Take 1 tablet by mouth daily. [provider] Taking Active Self  Omega-3 Fatty Acids (FISH OIL) 1000 MG CAPS [id number] Yes Take by mouth. [provider] Taking Active            Med Note Elana Alm, Kennon Rounds Oct 14, 2020  9:11 AM)     traMADol (ULTRAM) 50 MG tablet [id number] Yes Take 1 tablet (50 mg total) by mouth 2 (two) times daily as needed. Aundra Dubin, PA-C Taking Active   TRUEplus Lancets 33G MISC [id number] Yes USE AS DIRECTED 4 TIMES A DAY Ann Held, DO Taking Active   vitamin E 400 UNIT capsule [id number] Yes Take 400 Units by mouth daily. [provider] Taking Active Self            Patient Active Problem List   Diagnosis Date Noted   Primary hypertension 09/18/2021   Pain of left calf 09/18/2021   Depression with anxiety 06/21/2021   Diabetes mellitus ([place]) 01/17/2021   Type 2 diabetes mellitus with hyperglycemia, without long-term current use of insulin ([place]) 01/17/2021   Acute pain of right shoulder 08/18/2020   Primary osteoarthritis of right knee 08/18/2020   Impacted cerumen of right ear 05/24/2020   Bilateral impacted cerumen 05/17/2020   Hyperlipidemia associated with type 2 diabetes mellitus ([place]) 03/10/2020   Cough 03/10/2020   Seasonal allergies 03/10/2020   Elevated rheumatoid factor 07/21/2018   Gastroenteritis 07/21/2018   Trigger middle finger of left hand 12/19/2017   Ganglion cyst of dorsum of right wrist 12/19/2017   Morton's neuroma of right foot 12/19/2017   Preventative health care 12/13/2017   Pain in both hands 12/13/2017   Hyperlipidemia LDL goal <100 12/13/2017   Abscess of upper gum 12/13/2017   Spinal stenosis of cervical region 09/25/2016   Cervical radiculopathy 09/06/2016   Dyspnea 02/18/2016   Chest pain 02/18/2016   HLD (hyperlipidemia) 02/18/2016   CAD in native artery 02/18/2016   RLS (restless legs syndrome) 02/18/2016   Pancreatitis 02/18/2016   SOB (shortness of breath) 02/18/2016   Uncontrolled type 2 diabetes mellitus with complication    Tobacco abuse    Osteoarthritis of right knee 07/05/2015   Smoking 07/02/2013   Acute upper respiratory infections of unspecified site 07/02/2013   Flu vaccine need 07/02/2013    Multinodular  goiter 04/16/2013   Bunion 07/07/2012   BACK PAIN, THORACIC REGION, RIGHT 05/24/2009   IRON DEFICIENCY 03/25/2009   GERD 03/25/2009   WEIGHT LOSS 03/25/2009   SINUSITIS- ACUTE-NOS 11/25/2008   Hyperlipidemia LDL goal <70 10/07/2008   ACUTE PANCREATITIS 09/07/2008   OVARIAN CYST 09/07/2008   KNEE PAIN, RIGHT 05/24/2008   CARPAL TUNNEL SYNDROME, RIGHT 05/12/2008   Acute pain of both shoulders 04/02/2008   ROTATOR CUFF REPAIR, RIGHT, HX OF 03/29/2008   GOITER, MULTINODULAR 12/26/2007   Depression 07/07/2007   HIP PAIN, RIGHT 03/11/2007   RESTLESS LEG SYNDROME, HX OF 03/11/2007   Diabetes mellitus type II, uncontrolled ([place]) 03/07/2007   Essential hypertension 03/07/2007    Immunization History  Administered Date(s) Administered   Fluad Quad(high Dose 65+) 07/28/2019, 06/20/2021   Influenza Split 08/08/2012   Influenza, High Dose Seasonal PF 08/09/2014, 07/05/2015, 07/21/2018   PFIZER(Purple Top)SARS-COV-2 Vaccination 12/07/2019, 01/04/2020   PPD Test 08/27/2011, 09/04/2011, 07/13/2014, 09/23/2015   Pneumococcal Conjugate-13 08/09/2014   Pneumococcal Polysaccharide-23 11/17/2013   Td 03/11/2007   Zoster, Live 03/11/2007    Conditions to be addressed/monitored: CAD, HTN, HLD, DMII, Anxiety, Depression and shoulder pain; OA of knee; osteoporosis; h/o pancreatitis  Care Plan : General Pharmacy (Adult)  Updates made by Cherre Robins, RPH-CPP since 11/09/2021 12:00 AM     Problem: Chronic Disease Management support, education, and care coordination needs related to HTN, Hyperlipidemia; CAD, Diabetes, Depression/Anxiety, Osteoporosis, Neuropathy   Priority: High  Onset Date: 02/23/2021  Note:   Current Barriers:  Unable to independently afford treatment regimen Unable to achieve control of type 2 DM  Does not adhere to prescribed medication regimen Chronic Disease Management support, education, and care coordination needs related to HTN, Hyperlipidemia, CAD,  Diabetes, Depression/Anxiety, Osteoporosis, Neuropathy  Pharmacist Clinical Goal(s):  Over the next 90 days, patient will verbalize ability to afford treatment regimen achieve control of type 2 DM as evidenced by A1c <7.0% maintain control of HTN and lipids as evidenced by BP <130/80 (per cardio) and LDL <70  adhere to prescribed medication regimen as evidenced by patient report of taking medications daily  through collaboration with PharmD and provider.   Interventions: 1:1 collaboration with Carollee Herter, Alferd Apa, DO regarding development and update of comprehensive plan of care as evidenced by provider attestation and co-signature Inter-disciplinary care team collaboration (see longitudinal plan of care) Comprehensive medication review performed; medication list updated in electronic medical record  Hypertension Controlled; BP goal <130/80 Patient checks BP at home infrequently - usually once per week - 125 to 140 / 70's Patient has failed these meds in the past: None noted  Current regimen:  Amlodipine 40m daily Lisinopril 452mdaily Metoprolol Succinate 5032maily Interventions:  Requested patient to check her blood pressure 2 times per week and record Coordinated with pharmacy to make sure lisinopril was added to upcoming adherence packaging (has been left out previously) Ensure daily salt intake < 2300 mg/day Continue current regimen for blood pressure  Hyperlipidemia / CAD Controlled; LDL goal < 70 Current regimen:  Aspirin 83m64mily Fenofibrate 160mg19mly Omega 3 fish Oil 1030mg 5me daily Atorvastatin 40mg d34m (restarted 07/17/2021) Previous statin tried: rosuvastatin 10mg da80m- caused palpitation Interventions: Continue current regimen for heart health and cholesterol Continue to follow low fat diet Reviewed adherence for atorvastatin - last filled 90 days 08/08/2021. Coordinate with pharmacy for refill of adherence packaging.  Diabetes Lab Results   Component Value Date   HGBA1C 9.0 (A) 09/13/2021   HGBA1C 8.4 (  A) 06/23/2021   HGBA1C 8.1 (A) 05/10/2021   A1c not at goal. Initial goal is to get A1c <8.0 so patient can have shoulder surgery but eventually would like A1c goal <7% as long as doesn't increase hypoglycemia Followed by Dr Kelton Pillar - last appt was 06/23/2021 Current regimen:  Novolog 12 units with each meal + additional correction based on blood glucose (max of 70 units per day) Tyler Aas 44 units daily Jardiance 38m - take 1 tablet each morning Past medications - Januvia 1061mdaily and glimepiride 53m38mam (discontinue by Dr ShaKelton Pillar22); Novoloin 70/30 insulin - changed 06/2021 due to low efficacy History of pancreatitis - so no GLP1s or DPP4s Denies hypoglycemia or blood glucose <80  Received PAP for Jardiance in 2022. She has enough Jardiance for 4 weeks.  Previously Dr ShaKelton Pillars prescribed DexCom but initially was denied because patient was only injecting insulin twice a day. Has been resubmitted with new insulin regimen and approved. Patient endorses she is using DexCom  Interventions: Applied for LIS for 2023 as denial usually required by [facility] [place] assistance program. Mailed Jardiance patient assistance program to patient  ([facility] - patient, spouse and 2 grandchildren) Mailed patient assistance program for [place] Novolog to patient Reviewed insulin regimen, specifically bolus insulin dosing Discussed DM goals Fasting (before meal blood sugar): 80-130 Post-Prandial (2 hrs after eating blood sugar): less than 180 A1c (3 month blood sugar average): less than 7%  Depression / anxiety Patient reports anxiety and depression has improved some with increase dose of fluoxetine earlier this month Patient has failed these meds in past: sertraline (palpitations) Current regimen:  Alprazolam 0.51m76mree times daily as needed  Fluoxetine 60mg48mly Interventions:  Continue  current medications Updated EMR with current fluoxetine dose of 3 capsules of 20mg 97mtead of 1-20mg +76m0mg)  39moporosis  Last DEXA Scan: 05/29/2017             T-Score femoral neck: -2.0             T-Score forearm radius: -2.5 Current regimen:  Alendronate 70mg wee58mInterventions: (addressed at previous visit) Consider repeat DEXA Scan Continue current regimen  Shoulder Pain / osteoarthritis of right knee:  Surgery is planned once A1c is < 8.0% Current regimen:  Tramadol 50mg up t40mtimes a day if needed for pain Interventions: (addressed at previous visit) Discuss importance of taking diabetes meds daily and getting A1c <8.0 so surgery can be scheduled.  Continue to follow up with orthopedic office for pain management   Medication management Pharmacist Clinical Goal(s): Over the next 90 days, patient will work with PharmD and providers to maintain optimal medication adherence Current pharmacy: [facility] Interventions Comprehensive medication review performed. Continue with pharmacy medication synchronization, packaging and delivery - Last fill / deliver 11/06/2021 Coordinated refill for levo cetirizine to pharmacy.   Patient Goals/Self-Care Activities Over the next 90 days, patient will:  take medications as prescribed, check glucose with CGM, document, and provide at future appointments, and collaborate with provider on medication access solutions  Follow Up Plan: Telephone follow up appointment with care management team member scheduled for:  4 weeks  with clinical pharmacist            Medication Assistance:  Applied for LIS today ; If denied will apply for Jardiance[place] patient assistance program for 2023. Applications mailed to patient.    Patient's preferred pharmacy is:  [facility] -  [place], [place] - [address] [place] [address] Phone: [phone] Fax:  [place], [place] - [address] [place] [address] Phone: [phone] Fax: [place], [place] [address] [place] [address] Phone: [phone] Fax: [phone]  [place] [address] [place] [address] Phone: [phone] Fax: [phone]  [id number] [address] [place]Phone: [phone]: [phone] [facility] (New Address) [address] [place] [address]: [address]Phone: [phone]: [phone]   Follow Up:  Patient agrees to Care Plan and Follow-up.  Plan: Telephone follow up appointment with care management team member scheduled for:  3 weeks    TCherre Robins PharmD Clinical Pharmacist [facility]St. [facility] HospitalPrimary [facility] SW [facility]iloam[facility]

## 2021-11-11 ENCOUNTER — Encounter (HOSPITAL_BASED_OUTPATIENT_CLINIC_OR_DEPARTMENT_OTHER): Payer: Self-pay | Admitting: *Deleted

## 2021-11-11 ENCOUNTER — Emergency Department (HOSPITAL_BASED_OUTPATIENT_CLINIC_OR_DEPARTMENT_OTHER)
Admission: EM | Admit: 2021-11-11 | Discharge: 2021-11-11 | Disposition: A | Discharge status: Home / Self Care | Payer: Medicare HMO | Attending: Emergency Medicine | Admitting: Emergency Medicine

## 2021-11-11 ENCOUNTER — Other Ambulatory Visit: Payer: Self-pay

## 2021-11-11 DIAGNOSIS — M79605 Pain in left leg: Secondary | ICD-10-CM | POA: Diagnosis not present

## 2021-11-11 DIAGNOSIS — Z7982 Long term (current) use of aspirin: Secondary | ICD-10-CM | POA: Insufficient documentation

## 2021-11-11 DIAGNOSIS — G8929 Other chronic pain: Secondary | ICD-10-CM | POA: Insufficient documentation

## 2021-11-11 DIAGNOSIS — Z79899 Other long term (current) drug therapy: Secondary | ICD-10-CM | POA: Diagnosis not present

## 2021-11-11 DIAGNOSIS — M545 Low back pain, unspecified: Secondary | ICD-10-CM | POA: Diagnosis not present

## 2021-11-11 MED ORDER — HYDROCODONE-ACETAMINOPHEN 5-325 MG PO TABS: 2.0000 | ORAL_TABLET | ORAL | 0 refills | Status: DC | PRN

## 2021-11-11 NOTE — ED Notes (Signed)
No acute distress noted upon this RN's departure of patient. Verified discharge paperwork with name and DOB. Vital signs stable. Patient taken to checkout window. Discharge paperwork discussed with patient. Patient ambulated out without assistance or difficulty. No further questions voiced upon discharge.

## 2021-11-11 NOTE — ED Triage Notes (Addendum)
Pt reports chronic left leg pain. Has been eval in the past for blood clot and injury. States today pain is worse in calf. "Shooting" and feels "numb". States she had an ultrasound on Tuesday but has not had an xray

## 2021-11-11 NOTE — Discharge Instructions (Signed)
It sounds like her leg pain could be coming from a nerve in your back.  Follow-up with your primary care doctor regarding referral to a back specialist.  Return to the emergency room if you have any worsening symptoms.

## 2021-11-11 NOTE — ED Provider Notes (Signed)
[facility] EMERGENCY DEPARTMENT Provider Note   CSN: [id number] Arrival date & time: 11/11/21  1303     History  Chief Complaint  Patient presents with   Leg Pain    [name] is a 76 y.o. female.  Patient is a 76 year old female who presents with left leg pain.  She says she has had it for the last several years.  She says it got worse last night and she came here for evaluation.  She does have some intermittent numbness in the lateral aspect of the calf.  This is chronic and unchanged.  She says occasionally she will feel some weakness when she is walking but this is also unchanged from her baseline.  She feels like at times her leg is a little more swollen than normal.  She does have some ongoing chronic back pain as well.  She does not feel any radiation of the pain down her leg.  No loss of bowel or bladder control.  No fevers.  She has been taking Tylenol at home without improvement in symptoms.      Home Medications Prior to Admission medications   Medication Sig Start Date End Date Taking? Authorizing Provider  HYDROcodone-acetaminophen (NORCO/VICODIN) 5-325 MG tablet Take 2 tablets by mouth every 4 (four) hours as needed. 11/11/21  Yes Malvin Johns, MD  Alcohol Swabs (B-D SINGLE USE SWABS REGULAR) PADS USE AS DIRECTED 03/15/20   Carollee Herter, Alferd Apa, DO  alendronate (FOSAMAX) 70 MG tablet TAKE ONE TABLET BY MOUTH ONCE WEEKLY ON [facility] 07/21/21   Carollee Herter, Alferd Apa, DO  ALPRAZolam Duanne Moron) 0.25 MG tablet Take 1 tablet by mouth three times a day if needed 06/20/21   Carollee Herter, Alferd Apa, DO  amLODipine (NORVASC) 10 MG tablet TAKE ONE TABLET BY MOUTH ONCE DAILY 11/02/21   Ann Held, DO  aspirin EC 81 MG tablet Take 81 mg by mouth daily.    [provider]  atorvastatin (LIPITOR) 40 MG tablet Take 1 tablet (40 mg total) by mouth daily. 07/17/21   Ann Held, DO  Blood Glucose Calibration ([facility] CONTROL) NORMAL SOLN  10/19/12    [provider]  Blood Glucose Monitoring Suppl (TRUE METRIX AIR GLUCOSE METER) w/Device KIT 1 each by Does not apply route 4 (four) times daily. 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    [provider]  Continuous Blood Gluc Sensor (DEXCOM G6 SENSOR) MISC 1 Device by Does not apply route as directed. 09/13/21   Shamleffer, Jolon Degante Crazier, MD  Continuous Blood Gluc Transmit (DEXCOM G6 TRANSMITTER) MISC 1 Device by Does not apply route as directed. 09/13/21   Shamleffer, Izzak Fries Crazier, MD  empagliflozin (JARDIANCE) 25 MG TABS tablet Take 1 tablet (25 mg total) by mouth daily before breakfast. 05/10/21   Shamleffer, Kamyia Thomason Crazier, MD  fenofibrate 160 MG tablet TAKE ONE TABLET BY MOUTH EVERY EVENING 11/02/21   Carollee Herter, Yvonne R, DO  ferrous sulfate 325 (65 FE) MG tablet Take 325 mg by mouth daily with breakfast.     [provider]  Flaxseed, Linseed, (FLAXSEED OIL PO) Take 1 capsule by mouth 2 (two) times daily.     [provider]  FLUoxetine (PROZAC) 20 MG capsule Take 60 mg by mouth daily at 6 (six) AM. 06/27/21   [provider]  fluticasone (FLONASE) 50  MCG/ACT nasal spray SHAKE LIQUID AND USE 2 SPRAYS IN [facility] NOSTRIL DAILY 08/23/20   Carollee Herter, Kendrick Fries R, DO  glucose blood (TRUE METRIX BLOOD GLUCOSE TEST) test strip USE AS DIRECTED 4 TIMES A DAY 10/27/19   Carollee Herter, Alferd Apa, DO  insulin aspart (NOVOLOG FLEXPEN) 100 UNIT/ML FlexPen Max daily 70 units 06/23/21   Shamleffer, Kameshia Madruga Crazier, MD  insulin degludec (TRESIBA FLEXTOUCH) 100 UNIT/ML FlexTouch Pen Inject 44 Units into the skin daily. 09/13/21   Shamleffer, Clifford Benninger Crazier, MD  Insulin Pen Needle 32G X 4 MM MISC 1 Device by Does not apply route 4 (four) times daily. 06/23/21   Shamleffer, Tysheena Ginzburg Crazier, MD  Lancet Devices (LANCING DEVICE) MISC  10/19/12   [provider]  levocetirizine (XYZAL) 5 MG tablet TAKE 1 TABLET(5 MG) BY MOUTH EVERY EVENING 11/08/21   Carollee Herter, Yvonne R, DO  lisinopril (ZESTRIL) 40 MG tablet Take 1 tablet (40 mg total) by mouth daily. 11/02/21   Ann Held, DO  meloxicam (MOBIC) 7.5 MG tablet Take 1 tablet (7.5 mg total) by mouth daily as needed for pain. 09/12/20   Ann Held, DO  methocarbamol (ROBAXIN) 500 MG tablet Take 1 tablet (500 mg total) by mouth 2 (two) times daily as needed. 05/10/21   Aundra Dubin, PA-C  metoprolol succinate (TOPROL-XL) 50 MG 24 hr tablet TAKE ONE TABLET BY MOUTH ONCE DAILY WITH FOOD 11/02/21   Ann Held, DO  Multiple Vitamin (MULTIVITAMIN WITH MINERALS) TABS Take 1 tablet by mouth daily.    [provider]  Omega-3 Fatty Acids (FISH OIL) 1000 MG CAPS Take by mouth.    [provider]  traMADol (ULTRAM) 50 MG tablet Take 1 tablet (50 mg total) by mouth 2 (two) times daily as needed. 05/10/21   Aundra Dubin, PA-C  TRUEplus Lancets 33G MISC USE AS DIRECTED 4 TIMES A DAY 10/27/19   Roma Schanz R, DO  vitamin E 400 UNIT capsule Take 400 Units by mouth daily.    [provider]      Allergies    Metformin and related, Levemir [insulin detemir], Clindamycin, and Crestor [rosuvastatin calcium]    Review of Systems   Review of Systems  Constitutional:  Negative for fever.  Gastrointestinal:  Negative for nausea and vomiting.  Musculoskeletal:  Positive for back pain and myalgias. Negative for arthralgias, joint swelling and neck pain.  Skin:  Negative for wound.  Neurological:  Negative for weakness, numbness and headaches.   Physical Exam Updated Vital Signs BP (!) 142/63 (BP Location: Right Arm)    Pulse (!) 59    Temp 98.2 F (36.8 C) (Oral)    Resp 18    SpO2 100%  Physical Exam Constitutional:      Appearance: She is well-developed.  HENT:     Head: Normocephalic and atraumatic.  Cardiovascular:     Rate and Rhythm: Normal  rate.  Pulmonary:     Effort: Pulmonary effort is normal.  Musculoskeletal:        General: Tenderness present.     Cervical back: Normal range of motion and neck supple.     Comments: Mild tenderness to the lower lumbar region and the left paraspinal area.  Negative straight leg raise on the left.  Pedal pulses are intact.  She has normal sensation and motor function distally.  No visible swelling of the left leg as compared to the right.  No bony tenderness on  palpation of the leg.  Skin:    General: Skin is warm and dry.  Neurological:     Mental Status: She is alert and oriented to person, place, and time.    ED Results / Procedures / Treatments   Labs (all labs ordered are listed, but only abnormal results are displayed) Labs Reviewed - No data to display  EKG None  Radiology No results found.  Procedures Procedures    Medications Ordered in ED Medications - No data to display  ED Course/ Medical Decision Making/ A&P                           Medical Decision Making Risk Prescription drug management.   Patient presents with an exacerbation of her chronic left leg pain.  On exam, she seems to have good perfusion of the leg.  Pulses are intact.  No swelling that would be more concerning for DVT.  No bony tenderness.  No neurologic deficits.  No signs of cauda equina.  No suggestions of infection.  Her records reviewed.  She has had 2 Doppler ultrasounds of the leg which have been negative for DVT within the last year.  She had a recent arterial ultrasound on January 23 of this year which showed normal ABIs.  She saw an orthopedist in August of last year who was going to refer her to Dr. Kathyrn Sheriff with neurosurgery as it was felt that this was likely radicular pain from her back.  She said that she never did follow-up with a neurosurgeon.  I suspect that she may have some radicular pain from her back.  I discussed options.  At this point we can prescribe her a short  course of pain medication.  She has taken Vicodin before and opted for this.  I offered referral to a back specialist.  She states that she will follow-up with her PCP regarding this.  She was discharged home in good condition.  Return precautions were given.  Final Clinical Impression(s) / ED Diagnoses Final diagnoses:  Left leg pain    Rx / DC Orders ED Discharge Orders          Ordered    HYDROcodone-acetaminophen (NORCO/VICODIN) 5-325 MG tablet  Every 4 hours PRN        11/11/21 1407              Malvin Johns, MD 11/11/21 1413

## 2021-12-05 DIAGNOSIS — E785 Hyperlipidemia, unspecified: Secondary | ICD-10-CM | POA: Diagnosis not present

## 2021-12-05 DIAGNOSIS — Z794 Long term (current) use of insulin: Secondary | ICD-10-CM

## 2021-12-05 DIAGNOSIS — M8580 Other specified disorders of bone density and structure, unspecified site: Secondary | ICD-10-CM

## 2021-12-05 DIAGNOSIS — E1159 Type 2 diabetes mellitus with other circulatory complications: Secondary | ICD-10-CM | POA: Diagnosis not present

## 2021-12-05 DIAGNOSIS — I10 Essential (primary) hypertension: Secondary | ICD-10-CM | POA: Diagnosis not present

## 2021-12-05 DIAGNOSIS — F32A Depression, unspecified: Secondary | ICD-10-CM

## 2021-12-05 DIAGNOSIS — I2510 Atherosclerotic heart disease of native coronary artery without angina pectoris: Secondary | ICD-10-CM | POA: Diagnosis not present

## 2021-12-05 DIAGNOSIS — M1711 Unilateral primary osteoarthritis, right knee: Secondary | ICD-10-CM | POA: Diagnosis not present

## 2021-12-07 ENCOUNTER — Telehealth: Payer: Medicare HMO

## 2021-12-12 ENCOUNTER — Ambulatory Visit (INDEPENDENT_AMBULATORY_CARE_PROVIDER_SITE_OTHER): Payer: Medicare HMO | Admitting: Pharmacist

## 2021-12-12 DIAGNOSIS — E785 Hyperlipidemia, unspecified: Secondary | ICD-10-CM

## 2021-12-12 DIAGNOSIS — E1165 Type 2 diabetes mellitus with hyperglycemia: Secondary | ICD-10-CM

## 2021-12-12 DIAGNOSIS — F418 Other specified anxiety disorders: Secondary | ICD-10-CM

## 2021-12-12 DIAGNOSIS — I10 Essential (primary) hypertension: Secondary | ICD-10-CM

## 2021-12-12 NOTE — Chronic Care Management (AMB) (Signed)
Chronic Care Management Pharmacy Note  12/12/2021 Name:  [name] MRN:  [id number] DOB:  [birth year]  Summary:  Assisted patient in applying for low income subsidy with Medicare at last visit. She reports she received 2 letters - one denying LIS and the other approving. Patient to bring in (Jardiance cost was $10.35 today which would usually be cost if she was approved for LIS) . If denied will apply for [place] / Novolog patient assistance program. Patient has applications and will return this week. Coordinated with pharmacy for Jardiance refill and delivery.  Subjective: [name] is an 76 y.o. year old female who is a primary patient of Ann Held, DO.  The CCM team was consulted for assistance with disease management and care coordination needs.    Engaged with patient by telephone for  to follow up type 2 DM and chronic conditions  in response to provider referral for pharmacy case management and/or care coordination services.   Consent to Services:  The patient was given information about Chronic Care Management services, agreed to services, and gave verbal consent prior to initiation of services.  Please see initial visit note for detailed documentation.   Patient Care Team: Carollee Herter, Alferd Apa, DO as PCP - General Buford Dresser, MD as PCP - Cardiology (Cardiology) Ashok Pall, MD as Consulting Physician (Neurosurgery) Monna Fam, MD as Consulting Physician (Ophthalmology) Cherre Robins, RPH-CPP (Pharmacist) [facility], Melanie Crazier, MD as Consulting Physician (Endocrinology)  Recent office visits: 09/18/2021 - [facility] (Dr Etter SjogrenCheri Rous) Follow up hyperlipidemia. No med changes.  07/17/2021 - Fam Med (Dr Etter Sjogren) F/U depression and anxiety. Using alprazolam less since fluoxetine started. Changed rosuvastatin 37m to atorvastatin 421mbecuase patient felt rosuvastatin was causing palpitations at night. Ultrasound of lower  extremities ordered - no DVT noted.  06/20/2021 - PCP (Dr LoEtter SjogrenF/U depression and anxiety. Increased fluoxetine to 6042maily. Refilled alprazolam 0.64m1meceived flu vaccine 06/02/25/2022 - Phone call to PCP office. Patient feels fluoxetine and alprazolam not working. Asked for increase in dose. PCP recommended OV or video visit to discuss. Unable to reach patient to schedule appt.  Recent consult visits: 09/13/2021 - Endo (Dr ShamKelton PillarU diabetes. A1c 9.0% increased Tresiba to 44 units daily. -Continue Jardiance 25 mg, 1 tablet in the morning -Continue Novolog 12 units with each meal and correction factor: NovoLog (BG -130/25) 08/08/2021 - GI (Dr JacoArdis Hughsen for colon cancer screening. Discussed the risks and benefits to screening via colonoscopy versus screening via Cologuard stool based test.  She decided to go ahead with Cologuard stool based test 06/23/2021 - Endo (Dr ShamKelton PillarU type 2 DM, insuiln dependent. Trying to get A1c < 8.0% for shoulder surgery. Changed Novolin 70/30 Mix to basal / prandial regimen. Tresiba 40 units once daily. Start Novolog 12 units with each meal and also use correction factor. Continue Jardiance 64mg53mh morning. 05/10/2021 - Endo (Dr ShamlKelton Pillar DM with h/o pancreatitis. A1c trending down - was 8.1%. Increaesd Novolin Mix to 46 units before breakfast and 40 units before evening meal. [facility]: [facility]. She saw an orthopedist in August of last year who was going to refer her to Dr. NundkKathyrn Sheriff neurosurgery as it was felt that this was likely radicular pain from her back.  She said that she never did follow-up with a neurosurgeon. suspect that she may have some radicular pain from  her back.  I discussed options.  At this point we can prescribe her a short course of pain medication.  She has taken Vicodin before and opted for this.  I offered referral to a back  specialist.  She states that she will follow-up with her PCP regarding this.  She was discharged home in good condition.  Return precautions were given. Objective:  Lab Results  Component Value Date   CREATININE 0.75 09/18/2021   CREATININE 0.85 07/17/2021   CREATININE 0.94 04/24/2021    Lab Results  Component Value Date   HGBA1C 9.0 (A) 09/13/2021   Last diabetic Eye exam:  Lab Results  Component Value Date/Time   HMDIABEYEEXA Retinopathy (A) 12/30/2020 12:00 AM    Last diabetic Foot exam: No results found for: HMDIABFOOTEX  - Done 12/29/2020 by Dr Kelton Pillar.     Component Value Date/Time   CHOL 158 09/18/2021 1112   TRIG 83.0 09/18/2021 1112   HDL 39.80 09/18/2021 1112   CHOLHDL 4 09/18/2021 1112   VLDL 16.6 09/18/2021 1112   LDLCALC 101 (H) 09/18/2021 1112   LDLCALC 61 06/15/2020 0819   LDLDIRECT 53.0 04/24/2021 1542    Hepatic Function Latest Ref Rng & Units 09/18/2021 07/17/2021 04/24/2021  Total Protein 6.0 - 8.3 g/dL 6.4 6.7 6.7  Albumin 3.5 - 5.2 g/dL 4.1 4.3 4.4  AST 0 - 37 U/L '15 18 21  ' ALT 0 - 35 U/L '15 14 20  ' Alk Phosphatase 39 - 117 U/L 42 38(L) 35(L)  Total Bilirubin 0.2 - 1.2 mg/dL 0.9 0.6 0.4  Bilirubin, Direct 0.0 - 0.3 mg/dL - - -    Lab Results  Component Value Date/Time   TSH 0.58 08/11/2018 02:34 PM   TSH 0.57 07/16/2018 09:50 AM   FREET4 0.74 08/11/2018 02:34 PM   FREET4 0.84 04/16/2013 11:19 AM    CBC Latest Ref Rng & Units 12/06/2020 09/23/2020 08/11/2018  WBC 4.0 - 10.5 K/uL 7.0 13.9(H) 11.2(H)  Hemoglobin 12.0 - 15.0 g/dL 12.2 13.5 12.3  Hematocrit 36.0 - 46.0 % 36.6 40.1 36.9  Platelets 150.0 - 400.0 K/uL 182.0 215 221.0    No results found for: VD25OH  Clinical ASCVD: Yes  The 10-year ASCVD risk score (Arnett DK, et al., 2019) is: 25.7%   Values used to calculate the score:     Age: 76 years     Sex: Female     Is Non-Hispanic African American: Yes     Diabetic: Yes     Tobacco smoker: No     Systolic Blood Pressure: 703  mmHg     Is BP treated: Yes     HDL Cholesterol: 39.8 mg/dL     Total Cholesterol: 158 mg/dL    Other: DEXA 05/29/2017 Forearm Radius 33% T-score = -2.5.  DualFemur Neck Left  T-Score = -2.0; Which was a statistically significant increase in BMD of the left hip since prior exam dated 12/17/2013.  Social History   Tobacco Use  Smoking Status Former   Packs/day: 0.20   Types: Cigarettes   Quit date: 05/05/2020   Years since quitting: 1.6  Smokeless Tobacco Never  Tobacco Comments   quit in 2021   BP Readings from Last 3 Encounters:  11/11/21 133/73  09/18/21 140/70  09/13/21 114/68   Pulse Readings from Last 3 Encounters:  11/11/21 61  09/18/21 65  09/13/21 72   Wt Readings from Last 3 Encounters:  09/18/21 168 lb 12.8 oz (76.6 kg)  09/13/21 171 lb 9.6 oz (  77.8 kg)  08/08/21 171 lb (77.6 kg)    Assessment: Review of patient past medical history, allergies, medications, health status, including review of consultants reports, laboratory and other test data, was performed as part of comprehensive evaluation and provision of chronic care management services.   SDOH:  (Social Determinants of Health) assessments and interventions performed:       CCM Care Plan  Allergies  Allergen Reactions   Metformin And Related Other (See Comments)    Sick on stomach, sweaty, throw up, pain   Levemir [Insulin Detemir] Hives and Swelling   Clindamycin Nausea And Vomiting   Crestor [Rosuvastatin Calcium] Palpitations    Medications Reviewed Today     Reviewed by Cherre Robins, RPH-CPP (Pharmacist) on 12/12/21 at 1454  Med List Status: <None>   Medication Order Taking? Sig Documenting Provider Last Dose Status Informant  Alcohol Swabs (B-D SINGLE USE SWABS REGULAR) PADS [id number] Yes USE AS DIRECTED Carollee Herter, Alferd Apa, DO Taking Active   alendronate (FOSAMAX) 70 MG tablet [id number] Yes TAKE ONE TABLET BY MOUTH ONCE WEEKLY ON WEDNESDAY Ann Held, DO Taking Active    ALPRAZolam Duanne Moron) 0.25 MG tablet [id number] Yes Take 1 tablet by mouth three times a day if needed Ann Held, DO Taking Active   amLODipine (NORVASC) 10 MG tablet [id number] Yes TAKE ONE TABLET BY MOUTH ONCE DAILY Ann Held, DO Taking Active   aspirin EC 81 MG tablet [id number] Yes Take 81 mg by mouth daily. [provider] Taking Active Self  atorvastatin (LIPITOR) 40 MG tablet [id number] Yes Take 1 tablet (40 mg total) by mouth daily. Roma Schanz R, DO Taking Active   Blood Glucose Calibration ([facility] CONTROL) NORMAL SOLN [id number] Yes  [provider] Taking Active Self  Blood Glucose Monitoring Suppl (TRUE METRIX AIR GLUCOSE METER) w/Device KIT [id number] Yes 1 each by Does not apply route 4 (four) times daily. DX CODE E11.8 Ann Held, DO Taking Active   Carboxymethylcellulose Sodium (THERATEARS OP) [id number] Yes Place 1 drop into both eyes daily as needed (dry eyes). [provider] Taking Active Self  Continuous Blood Gluc Sensor (DEXCOM G6 SENSOR) MISC [id number] Yes 1 Device by Does not apply route as directed. Shamleffer, Melanie Crazier, MD Taking Active   Continuous Blood Gluc Transmit (DEXCOM G6 TRANSMITTER) MISC [id number] Yes 1 Device by Does not apply route as directed. Shamleffer, Melanie Crazier, MD Taking Active   empagliflozin (JARDIANCE) 25 MG TABS tablet [id number] Yes Take 1 tablet (25 mg total) by mouth daily before breakfast. Shamleffer, Melanie Crazier, MD Taking Active            Med Note [place]   Tue Dec 12, 2021  2:48 PM) Approved for pap in late 2022 thru 10/07/2021  fenofibrate 160 MG tablet [id number] Yes TAKE ONE TABLET BY MOUTH EVERY EVENING Ann Held, DO Taking Active   ferrous sulfate 325 (65 FE) MG tablet [id number] Yes Take 325 mg by mouth daily with breakfast.  [provider] Taking Active Self  fexofenadine (ALLEGRA) 180 MG tablet [id number] Yes Take 180 mg by mouth daily  as needed for allergies or rhinitis. [provider] Taking Active   Flaxseed, Linseed, (FLAXSEED OIL PO) [id number] Yes Take 1 capsule by mouth 2 (two) times daily.  [provider] Taking Active Self  FLUoxetine (PROZAC) 20 MG capsule [id number] Yes Take 60 mg by mouth daily at 6 (six) AM. [provider]  Taking Active   fluticasone (FLONASE) 50 MCG/ACT nasal spray [id number] Yes SHAKE LIQUID AND USE 2 SPRAYS IN [facility] NOSTRIL DAILY Roma Schanz R, DO Taking Active   glucose blood (TRUE METRIX BLOOD GLUCOSE TEST) test strip [id number] No USE AS DIRECTED 4 TIMES A DAY  Patient not taking: Reported on 12/12/2021   Ann Held, DO Not Taking Active   insulin aspart (NOVOLOG FLEXPEN) 100 UNIT/ML FlexPen [id number] Yes Max daily 70 units Shamleffer, Melanie Crazier, MD Taking Active   insulin degludec (TRESIBA FLEXTOUCH) 100 UNIT/ML FlexTouch Pen [id number] Yes Inject 44 Units into the skin daily. Shamleffer, Melanie Crazier, MD Taking Active   Insulin Pen Needle 32G X 4 MM MISC [id number] Yes 1 Device by Does not apply route 4 (four) times daily. Shamleffer, Melanie Crazier, MD Taking Active   Lancet Devices (LANCING DEVICE) [place] [id number] Yes  [provider] Taking Active Self  lisinopril (ZESTRIL) 40 MG tablet [id number] Yes Take 1 tablet (40 mg total) by mouth daily. Roma Schanz R, DO Taking Active   metoprolol succinate (TOPROL-XL) 50 MG 24 hr tablet [id number] Yes TAKE ONE TABLET BY MOUTH ONCE DAILY WITH FOOD Carollee Herter, Alferd Apa, DO Taking Active   Multiple Vitamin (MULTIVITAMIN WITH MINERALS) TABS [id number] Yes Take 1 tablet by mouth daily. [provider] Taking Active Self  Omega-3 Fatty Acids (FISH OIL) 1000 MG CAPS [id number] Yes Take 1 capsule by mouth in the morning and at bedtime. [provider] Taking Active            Med Note Elana Alm, Kennon Rounds Oct 14, 2020  9:11 AM)    traMADol (ULTRAM) 50 MG tablet [id number] No Take 1  tablet (50 mg total) by mouth 2 (two) times daily as needed.  Patient not taking: Reported on 12/12/2021   Nathaniel Man Not Taking Active   TRUEplus Lancets 33G MISC [id number] Yes USE AS DIRECTED 4 TIMES A DAY Ann Held, DO Taking Active   vitamin E 400 UNIT capsule [id number] Yes Take 400 Units by mouth daily. [provider] Taking Active Self            Patient Active Problem List   Diagnosis Date Noted   Primary hypertension 09/18/2021   Pain of left calf 09/18/2021   Depression with anxiety 06/21/2021   Diabetes mellitus ([place]) 01/17/2021   Type 2 diabetes mellitus with hyperglycemia, without long-term current use of insulin ([place]) 01/17/2021   Acute pain of right shoulder 08/18/2020   Primary osteoarthritis of right knee 08/18/2020   Impacted cerumen of right ear 05/24/2020   Bilateral impacted cerumen 05/17/2020   Hyperlipidemia associated with type 2 diabetes mellitus ([place]) 03/10/2020   Cough 03/10/2020   Seasonal allergies 03/10/2020   Elevated rheumatoid factor 07/21/2018   Gastroenteritis 07/21/2018   Trigger middle finger of left hand 12/19/2017   Ganglion cyst of dorsum of right wrist 12/19/2017   Morton's neuroma of right foot 12/19/2017   Preventative health care 12/13/2017   Pain in both hands 12/13/2017   Hyperlipidemia LDL goal <100 12/13/2017   Abscess of upper gum 12/13/2017   Spinal stenosis of cervical region 09/25/2016   Cervical radiculopathy 09/06/2016   Dyspnea 02/18/2016   Chest pain 02/18/2016   HLD (hyperlipidemia) 02/18/2016   CAD in native artery 02/18/2016   RLS (restless legs syndrome) 02/18/2016   Pancreatitis 02/18/2016   SOB (shortness of breath) 02/18/2016   Uncontrolled type 2 diabetes mellitus with  complication    Tobacco abuse    Osteoarthritis of right knee 07/05/2015   Smoking 07/02/2013   Acute upper respiratory infections of unspecified site 07/02/2013   Flu vaccine need 07/02/2013   Multinodular  goiter 04/16/2013   Bunion 07/07/2012   BACK PAIN, THORACIC REGION, RIGHT 05/24/2009   IRON DEFICIENCY 03/25/2009   GERD 03/25/2009   WEIGHT LOSS 03/25/2009   SINUSITIS- ACUTE-NOS 11/25/2008   Hyperlipidemia LDL goal <70 10/07/2008   ACUTE PANCREATITIS 09/07/2008   OVARIAN CYST 09/07/2008   KNEE PAIN, RIGHT 05/24/2008   CARPAL TUNNEL SYNDROME, RIGHT 05/12/2008   Acute pain of both shoulders 04/02/2008   ROTATOR CUFF REPAIR, RIGHT, HX OF 03/29/2008   GOITER, MULTINODULAR 12/26/2007   Depression 07/07/2007   HIP PAIN, RIGHT 03/11/2007   RESTLESS LEG SYNDROME, HX OF 03/11/2007   Diabetes mellitus type II, uncontrolled ([place]) 03/07/2007   Essential hypertension 03/07/2007    Immunization History  Administered Date(s) Administered   Fluad Quad(high Dose 65+) 07/28/2019, 06/20/2021   Influenza Split 08/08/2012   Influenza, High Dose Seasonal PF 08/09/2014, 07/05/2015, 07/21/2018   PFIZER(Purple Top)SARS-COV-2 Vaccination 12/07/2019, 01/04/2020   PPD Test 08/27/2011, 09/04/2011, 07/13/2014, 09/23/2015   Pneumococcal Conjugate-13 08/09/2014   Pneumococcal Polysaccharide-23 11/17/2013   Td 03/11/2007   Zoster, Live 03/11/2007    Conditions to be addressed/monitored: CAD, HTN, HLD, DMII, Anxiety, Depression and shoulder pain; OA of knee; osteoporosis; h/o pancreatitis  Care Plan : General Pharmacy (Adult)  Updates made by Cherre Robins, RPH-CPP since 12/12/2021 12:00 AM     Problem: Chronic Disease Management support, education, and care coordination needs related to HTN, Hyperlipidemia; CAD, Diabetes, Depression/Anxiety, Osteoporosis, Neuropathy   Priority: High  Onset Date: 02/23/2021  Note:   Current Barriers:  Unable to independently afford treatment regimen Unable to achieve control of type 2 DM  Does not adhere to prescribed medication regimen Chronic Disease Management support, education, and care coordination needs related to HTN, Hyperlipidemia, CAD, Diabetes,  Depression/Anxiety, Osteoporosis, Neuropathy  Pharmacist Clinical Goal(s):  Over the next 90 days, patient will verbalize ability to afford treatment regimen achieve control of type 2 DM as evidenced by A1c <7.0% maintain control of HTN and lipids as evidenced by BP <130/80 (per cardio) and LDL <70  adhere to prescribed medication regimen as evidenced by patient report of taking medications daily  through collaboration with PharmD and provider.   Interventions: 1:1 collaboration with Carollee Herter, Alferd Apa, DO regarding development and update of comprehensive plan of care as evidenced by provider attestation and co-signature Inter-disciplinary care team collaboration (see longitudinal plan of care) Comprehensive medication review performed; medication list updated in electronic medical record  Hypertension Controlled; BP goal <130/80 Patient checks BP at home infrequently - usually once per week - 125 to 140 / 70's Patient has failed these meds in the past: None noted  Current regimen:  Amlodipine 80m daily Lisinopril 43mdaily Metoprolol Succinate 5035maily Interventions:  Requested patient to check her blood pressure 2 times per week and record Coordinated with pharmacy to make sure lisinopril was added to upcoming adherence packaging (has been left out previously) Ensure daily salt intake < 2300 mg/day Continue current regimen for blood pressure  Hyperlipidemia / CAD Controlled; LDL goal < 70 Current regimen:  Aspirin 63m50mily Fenofibrate 160mg67mly Omega 3 fish Oil 1030mg 61me daily Atorvastatin 40mg d27m (restarted 07/17/2021) Previous statin tried: rosuvastatin 10mg da70m- caused palpitation Interventions: Continue current regimen for heart health and cholesterol Continue to follow low fat  diet Reviewed adherence for atorvastatin - last filled 90 days 11/02/2021.   Diabetes Lab Results  Component Value Date   HGBA1C 9.0 (A) 09/13/2021   HGBA1C 8.4 (A)  06/23/2021   HGBA1C 8.1 (A) 05/10/2021   A1c not at goal. Initial goal is to get A1c <8.0 so patient can have shoulder surgery but eventually would like A1c goal <7% as long as doesn't increase hypoglycemia Followed by Dr Kelton Pillar  Current regimen:  Novolog 12 units with each meal + additional correction based on blood glucose (max of 70 units per day) Tyler Aas 44 units daily Jardiance 73m - take 1 tablet each morning Past medications - Januvia 1054mdaily and glimepiride 39m67mam (discontinue by Dr ShaKelton Pillar22); Novoloin 70/30 insulin - changed 06/2021 due to low efficacy History of pancreatitis - so no GLP1s or DPP4s Denies hypoglycemia or blood glucose <80  Received PAP for Jardiance in 2022. Patient states that she took her last JarVania Readay but has not returned 2023 patient assistance program application to our office yet. At our last visit we did apply for LIS. Patient states she received 2 letters - one stating she did not qualify for LIS and another stating she did. She is not sure which is correct.  Patient endorses she is using DexCom - Sees Dr ShaKelton Pillarmorrow for follow up Interventions: Coordinated refill for Jardiance with [facility] - 30 days supply was $10.35 which should be LIS copay. Patient to bring in LIS letters she received for review. Might not need patient assistance program applications. Reviewed insulin regimen, specifically bolus insulin dosing Discussed DM goals Fasting (before meal blood sugar): 80-130 Post-Prandial (2 hrs after eating blood sugar): less than 180 A1c (3 month blood sugar average): less than 7%  Depression / anxiety Patient reports anxiety and depression has improved some with increase dose of fluoxetine earlier this month Patient has failed these meds in past: sertraline (palpitations) Current regimen:  Alprazolam 0.29m29mree times daily as needed  Fluoxetine 60mg84mly Interventions:  Continue current medications  Osteoporosis   Last DEXA Scan: 05/29/2017             T-Score femoral neck: -2.0             T-Score forearm radius: -2.5 Current regimen:  Alendronate 70mg 37mly Interventions: (addressed at previous visit) Consider repeat DEXA Scan Continue current regimen  Shoulder Pain / osteoarthritis of right knee:  Surgery is planned once A1c is < 8.0% Current regimen:  Tramadol 50mg u38m 3 times a day if needed for pain Interventions: Discuss importance of taking diabetes meds daily and getting A1c <8.0 so surgery can be scheduled.  Continue to follow up with orthopedic office for pain management   Medication management Pharmacist Clinical Goal(s): Over the next 90 days, patient will work with PharmD and providers to maintain optimal medication adherence Current pharmacy: [facility] Interventions Comprehensive medication review performed. Continue with pharmacy medication synchronization, packaging and delivery - Last fill / deliver 11/06/2021  Patient Goals/Self-Care Activities Over the next 90 days, patient will:  take medications as prescribed,  check glucose with CGM, document, and provide at future appointments, and  collaborate with provider on medication access solutions Bring in social security letter regarding LIS Complete applications for patient assistance programs  Follow Up Plan: Telephone follow up appointment with care management team member scheduled for:  4 to 6 weeks  with clinical pharmacist            Medication Assistance:  Applied for LIS - appears was approved but patient to bring in letter to verify ; If denied will apply for [place] and Novolog patient assistance program for 2023.     Patient's preferred pharmacy is:  [facility] - [place], [place] - [address] [place] [address] Phone: [phone] Fax: [place], [place] [address]  [place] [address] Phone: [phone] Fax: [place], [address] [place] [address] Phone: [phone] Fax: [phone]  [place] [address], [place] [address] Phone: [phone] Fax: [phone]  [id number] [address] [place]Phone: [phone]: [phone] [facility] (New Address) - [place] [address]: [place]Phone: [phone]: [phone]   Follow Up:  Patient agrees to Care Plan and Follow-up.  Plan: Telephone follow up appointment with care management team member scheduled for:  4 weeks    TCherre Robins PharmD Clinical Pharmacist LM[facility] HospitalPrimary [facility] SW [facility]HJam[facility]

## 2021-12-12 NOTE — Patient Instructions (Signed)
[name],  ?It was a pleasure speaking with you today.  ?I have attached a summary of our visit today and information about your health goals.  ? ?If you have any questions or concerns, please feel free to contact me either at the phone number below or with a MyChart message.  ? ?Keep up the good work! ? ?[name], PharmD ?Clinical Pharmacist ?[facility] ?[facility] ?[phone] (direct line)  ?[phone] (main office number) ? ? ?CARE PLAN ENTRY ?(see longitudinal plan of care for additional care plan information) ? ?Current Barriers:  ?Chronic Disease Management support, education, and care coordination needs related to Hypertension, Hyperlipidemia/Hx of CAD, Diabetes, Tobacco Use Disorder, Depression/Anxiety, Osteoporosis, Neuropathy ?  ?Hypertension ?BP Readings from Last 3 Encounters:  ?11/11/21 133/73  ?09/18/21 140/70  ?09/13/21 114/68  ? ?Pharmacist Clinical Goal(s): ?Over the next 90 days, patient will work with PharmD and providers to maintain BP goal <130/80 ?Current regimen:  ?Amlodipine 10mg  daily ?Lisinopril 40mg  daily ?Metoprolol Succinate 50mg  daily ?Interventions: ?Requested patient to check her blood pressure 2 times per week and record ?Patient self care activities - Over the next 90 days, patient will: ?Check blood pressure 2 times per week, document, and provide at future appointments ?Ensure daily salt intake < 2300 mg/day ?Continue current regimen for blood pressure ? ?Hyperlipidemia / Heart Disease ?Lab Results  ?Component Value Date/Time  ? LDLCALC 101 (H) 09/18/2021 11:12 AM  ? LDLCALC 61 06/15/2020 08:19 AM  ? LDLDIRECT 53.0 04/24/2021 03:42 PM  ? ?Pharmacist Clinical Goal(s): ?Over the next 90 days, patient will work with PharmD and providers to maintain LDL goal < 70 ?Current regimen:  ?Aspirin 81mg  daily ?Fenofibrate 160mg  daily ?Omega 3 fish Oil 1030mg  twice daily ?Atorvastatin 40mg  daily ?Interventions: ?Reviewed last lipid panel with patient. Has  maintained LDL at goal and Tg improved form 344 in 2021 to 145 on 12/06/2020 ?Patient self care activities - Over the next 90 days, patient will: ?Continue current regimen for heart health and cholesterol ?Continue to follow low fat diet ? ?Diabetes ?Lab Results  ?Component Value Date/Time  ? HGBA1C 9.0 (A) 09/13/2021 09:48 AM  ? HGBA1C 8.4 (A) 06/23/2021 01:43 PM  ? HGBA1C 8.4 (H) 04/24/2021 03:42 PM  ? HGBA1C 8.4 (H) 02/16/2021 09:37 AM  ? ?Pharmacist Clinical Goal(s): ?Over the next 180 days, patient will work with PharmD and providers to achieve A1c goal <7% ?Current regimen:  ?Treiba 40 units daily ?Novolog - 12 units plus additional correction based on scale from Dr ?Jardiance 25mg  - take 1 tablet each morning ?Interventions: ?Discussed DM goals ?Fasting (before meal blood sugar): 80-130 ?Post-Prandial (2 hrs after eating blood sugar): less than 180 ?A1c (3 month blood sugar average): less than 7% ?Helped patient apply for LIS - she will bring in letter to determine if approved but appears she was based on recent refill for Jardiance being $10.35 ?Patient self care activities - Over the next 90 days, patient will: ?Continue to check blood glucose prior to meals and at bedtime ?Contact provider with any episodes of hypoglycemia  ? ? ?Depression / anxiety ?Pharmacist Clinical Goal(s) ?Over the next 90 days, patient will work with PharmD and providers to reduce symptoms associated with depression ?Current regimen:  ?Alprazolam 0.25mg  three times daily as needed  ?Fluoxetine 60mg  daily - take 3 capsules of 20mg  to equal 60mg  daily ?Interventions: ?None today ?Patient self care activities - Over the next 90 days, patient will: ?Continue current regimen ? ?Osteoporosis ?Pharmacist  Clinical Goal(s) ?Over the next 180 days, patient will work with PharmD and providers to reduce risk of fracture due to osteoporosis  ?Current regimen:  ?Alendronate 70mg  weekly ?Patient self care activities - Over the next 180  days, patient will: ?Consider repeat DEXA Scan ? ?Shoulder Pain:  ?Pharmacist Clinical Goal(s) ?Over the next 180 days, patient will work with PharmD and providers to improve pain and improve BG control to A1c <8.0 so patient can be scheduled for needed shoulder surgery ?Current regimen:  ?Tramadol 50mg  up to 3 times a day if needed for pain ?Interventions: ?Discuss importance of taking diabetes meds daily and getting A1c <8.0 so surgery can be scheduled.  ?Patient self care activities - Over the next 180 days, patient will: ?Continue to follow up with orthopedic office for pain management  ? ?Medication management ?Pharmacist Clinical Goal(s): ?Over the next 90 days, patient will work with PharmD and providers to maintain optimal medication adherence ?Current pharmacy: [facility] switched to [facility] ?Interventions ?Comprehensive medication review performed. ?Utilize [facility] pharmacy for medication synchronization, packaging and delivery ?Patient self care activities - Over the next 90 days, patient will: ?Focus on medication adherence by filling and taking medications appropriately  ?Take medications as prescribed ?Report any questions or concerns to PharmD and/or provider(s) ? ?Patient Goals/Self-Care Activities ?take medications as prescribed,  ?check glucose with CGM, document, and provide at future appointments, and  ?collaborate with provider on medication access solutions ?Bring in social security letter regarding LIS ?Complete applications for patient assistance programs ? ?The patient verbalized understanding of instructions, educational materials, and care plan provided today and agreed to receive a mailed copy of patient instructions, educational materials, and care plan.   ?

## 2021-12-13 ENCOUNTER — Other Ambulatory Visit: Payer: Self-pay

## 2021-12-13 ENCOUNTER — Encounter: Payer: Self-pay | Admitting: Internal Medicine

## 2021-12-13 ENCOUNTER — Ambulatory Visit: Payer: Medicare HMO | Admitting: Internal Medicine

## 2021-12-13 VITALS — BP 120/72 | HR 80 | Ht 65.0 in | Wt 172.0 lb

## 2021-12-13 DIAGNOSIS — E1165 Type 2 diabetes mellitus with hyperglycemia: Secondary | ICD-10-CM

## 2021-12-13 LAB — POCT GLYCOSYLATED HEMOGLOBIN (HGB A1C): Hemoglobin A1C: 7.9 % — AB (ref 4.0–5.6)

## 2021-12-13 NOTE — Progress Notes (Signed)
Name: [name]  Age/ Sex: 76 y.o., female   MRN/ DOB: [id number], Apr 09, 1946     PCP: Ann Held, DO   Reason for Endocrinology Evaluation: Type 2 Diabetes Mellitus  Initial Endocrine Consultative Visit: 01/17/2021    PATIENT IDENTIFIER: [name] is a 76 y.o. female with a past medical history of HTN, T2Dm, CAD , Osteoporosis and Hx of pancreatitis. The patient has followed with Endocrinology clinic since 4 f/09/2021 or consultative assistance with management of her diabetes.  DIABETIC HISTORY:  Ms. Cielo was diagnosed with DM in 1998, has intolerance to Metformin due to Gi side effects. Her hemoglobin A1c has ranged from 9.3% in 2015, peaking at 10.1% in 2020.  She has history of Pancreatitis that she believes is caused Metformin   On her initial visit to our clinic she had an A1c of 9.4%  , she was on Jardiance,Glimepiride , Januvia and insulin mix . We stopped Glimepiride as she was not taking it, we stopped Januvia due to hx of pancreatitis. We continued jardiance and adjusted insulin mix     Switched insulin Mix to Basal/prandial insulin 05/2021   SUBJECTIVE:   During the last visit (09/13/2021): A1c 9.0 % . conitnued Jardiance and adjusted insulin dose    Today (12/13/2021): [name] is here for a follow up on diabetes management. She checks her blood sugars 2-3 times daily. The patient has not had hypoglycemic episodes since the last clinic visit.  She did not bring a meter today    Presented to the ED 11/2021 for leg pain Denies nausea or vomiting  or diarrhea   She is waiting to have right shoulder sx but waiting on A1c to be less then 8%   She denies nausea, vomiting or diarrhea      HOME DIABETES REGIMEN:  Jardiance 25 mg, 1 tablet in the morning Tresiba 44 units ONCE daily  Novolog 12 units with each meal  Correction factor: NovoLog (BG -130/25)      Statin: yes ACE-I/ARB: yes   CONTINUOUS GLUCOSE MONITORING RECORD  INTERPRETATION    Dates of Recording: 2/23-12/13/2021  Sensor description: dexcom  Results statistics:   CGM use % of time 93  Average and SD 143/33  Time in range      87  %  % Time Above 180 12  % Time above 250 0  % Time Below target <1    Glycemic patterns summary: BG is optimal overnight, hypoglycemia noted  Hyperglycemic episodes postprandial  Hypoglycemic episodes occurred rare during the day  Overnight periods: optimal     DIABETIC COMPLICATIONS: Microvascular complications:   Denies: CKD, retinopathy, neuropathy Last Eye Exam: Completed 12/2020 scheduled 01/2022  Macrovascular complications:  CAD ( S/P stent placement)  Denies: CVA, PVD   HISTORY:  Past Medical History:  Past Medical History:  Diagnosis Date   Blockage of coronary artery of heart (HCC)    Depression    Diabetes mellitus    Hyperlipidemia    Hypertension    Pancreatitis    Restless leg syndrome    Past Surgical History:  Past Surgical History:  Procedure Laterality Date   CATARACT EXTRACTION, BILATERAL     CORONARY ANGIOPLASTY WITH STENT PLACEMENT     FOOT SURGERY Right    cyst removal   ROTATOR CUFF REPAIR     right   ROTATOR CUFF REPAIR Left    TUBAL LIGATION     Social History:  reports  smoking about 19 months ago. Her smoking use included cigarettes. She smoked an average of .2 packs per day. She has never used smokeless tobacco. She reports that she does not drink alcohol and does not use drugs. °Family History:  °Family History  °Problem Relation Age of Onset  ° Heart attack Father   ° Heart attack Brother   ° Diabetes Sister   ° Cirrhosis Sister   ° Hypertension Sister   ° Cancer Mother   ° Hypertension Mother   ° Vision loss Mother   ° Cancer Sister   ° Sleep apnea Sister   ° Heart Problems Sister   ° Diabetes Sister   ° Gout Sister   ° Diabetes Son   ° Heart attack Son   ° Diabetes Daughter   ° Goiter Daughter   ° Diabetes Daughter   ° ° ° °HOME  MEDICATIONS: °Allergies as of 12/13/2021   ° °   Reactions  ° Metformin And Related Other (See Comments)  ° Sick on stomach, sweaty, throw up, pain  ° Levemir [insulin Detemir] Hives, Swelling  ° Clindamycin Nausea And Vomiting  ° Crestor [rosuvastatin Calcium] Palpitations  ° °  ° °  °Medication List  °  ° °  ° Accurate as of December 13, 2021 10:15 AM. If you have any questions, ask your nurse or doctor.  °  °  ° °  ° °alendronate 70 MG tablet °Commonly known as: FOSAMAX °TAKE ONE TABLET BY MOUTH ONCE WEEKLY ON WEDNESDAY °  °ALPRAZolam 0.25 MG tablet °Commonly known as: XANAX °Take 1 tablet by mouth three times a day if needed °  °amLODipine 10 MG tablet °Commonly known as: NORVASC °TAKE ONE TABLET BY MOUTH ONCE DAILY °  °aspirin EC 81 MG tablet °Take 81 mg by mouth daily. °  °atorvastatin 40 MG tablet °Commonly known as: LIPITOR °Take 1 tablet (40 mg total) by mouth daily. °  °B-D SINGLE USE SWABS REGULAR Pads °USE AS DIRECTED °  °Dexcom G6 Sensor Misc °1 Device by Does not apply route as directed. °  °Dexcom G6 Transmitter Misc °1 Device by Does not apply route as directed. °  °empagliflozin 25 MG Tabs tablet °Commonly known as: Jardiance °Take 1 tablet (25 mg total) by mouth daily before breakfast. °  °fenofibrate 160 MG tablet °TAKE ONE TABLET BY MOUTH EVERY EVENING °  °ferrous sulfate 325 (65 FE) MG tablet °Take 325 mg by mouth daily with breakfast. °  °fexofenadine 180 MG tablet °Commonly known as: ALLEGRA °Take 180 mg by mouth daily as needed for allergies or rhinitis. °  °Fish Oil 1000 MG Caps °Take 1 capsule by mouth in the morning and at bedtime. °  °FLAXSEED OIL PO °Take 1 capsule by mouth 2 (two) times daily. °  °FLUoxetine 20 MG capsule °Commonly known as: PROZAC °Take 60 mg by mouth daily at 6 (six) AM. °  °fluticasone 50 MCG/ACT nasal spray °Commonly known as: FLONASE °SHAKE LIQUID AND USE 2 SPRAYS IN EACH NOSTRIL DAILY °  °Insulin Pen Needle 32G X 4 MM Misc °1 Device by Does not apply route 4 (four)  times daily. °  °Lancing Device Misc °  °lisinopril 40 MG tablet °Commonly known as: ZESTRIL °Take 1 tablet (40 mg total) by mouth daily. °  °metoprolol succinate 50 MG 24 hr tablet °Commonly known as: TOPROL-XL °TAKE ONE TABLET BY MOUTH ONCE DAILY WITH FOOD °  °multivitamin with minerals Tabs tablet °Take 1 tablet by mouth daily. °  °  NovoLOG FlexPen 100 UNIT/ML FlexPen °Generic drug: insulin aspart °Max daily 70 units °  °Tai Doc Control Normal Soln °  °THERATEARS OP °Place 1 drop into both eyes daily as needed (dry eyes). °  °traMADol 50 MG tablet °Commonly known as: ULTRAM °Take 1 tablet (50 mg total) by mouth 2 (two) times daily as needed. °  °Tresiba FlexTouch 100 UNIT/ML FlexTouch Pen °Generic drug: insulin degludec °Inject 44 Units into the skin daily. °  °True Metrix Air Glucose Meter w/Device Kit °1 each by Does not apply route 4 (four) times daily. DX CODE E11.8 °  °True Metrix Blood Glucose Test test strip °Generic drug: glucose blood °USE AS DIRECTED 4 TIMES A DAY °  °TRUEplus Lancets 33G Misc °USE AS DIRECTED 4 TIMES A DAY °  °vitamin E 180 MG (400 UNITS) capsule °Take 400 Units by mouth daily. °  ° °  ° ° ° °OBJECTIVE:  ° °Vital Signs: BP 120/72 (BP Location: Left Arm, Patient Position: Sitting, Cuff Size: Small)    Pulse 80    Ht 5' 5" (1.651 m)    Wt 172 lb (78 kg)    BMI 28.62 kg/m²   °Wt Readings from Last 3 Encounters:  °12/13/21 172 lb (78 kg)  °09/18/21 168 lb 12.8 oz (76.6 kg)  °09/13/21 171 lb 9.6 oz (77.8 kg)  ° ° ° °Exam: °General: Pt appears well and is in NAD  °Lungs: Clear with good BS bilat with no rales, rhonchi, or wheezes  °Heart: RRR   °Extremities: No pretibial edema.   °Neuro: MS is good with appropriate affect, pt is alert and Ox3  ° ° °DM foot exam: 06/23/2021 °  °The skin of the feet is intact without sores or ulcerations. °The pedal pulses are 2+ on right and 2+ on left. °The sensation is intact to a screening 5.07, 10 gram monofilament bilaterally °   ° ° ° °DATA  REVIEWED: ° °Lab Results  °Component Value Date  ° HGBA1C 7.9 (A) 12/13/2021  ° HGBA1C 9.0 (A) 09/13/2021  ° HGBA1C 8.4 (A) 06/23/2021  ° °Lab Results  °Component Value Date  ° MICROALBUR 12.2 (H) 12/06/2020  ° LDLCALC 101 (H) 09/18/2021  ° CREATININE 0.75 09/18/2021  ° °Lab Results  °Component Value Date  ° MICRALBCREAT 13.3 12/06/2020  ° ° ° °Lab Results  °Component Value Date  ° CHOL 158 09/18/2021  ° HDL 39.80 09/18/2021  ° LDLCALC 101 (H) 09/18/2021  ° LDLDIRECT 53.0 04/24/2021  ° TRIG 83.0 09/18/2021  ° CHOLHDL 4 09/18/2021  °    ° Latest Reference Range & Units 07/17/21 09:12  °Sodium 135 - 145 mEq/L 137  °Potassium 3.5 - 5.1 mEq/L 4.2  °Chloride 96 - 112 mEq/L 103  °CO2 19 - 32 mEq/L 26  °Glucose 70 - 99 mg/dL 149 (H)  °BUN 6 - 23 mg/dL 22  °Creatinine 0.40 - 1.20 mg/dL 0.85  °Calcium 8.4 - 10.5 mg/dL 9.7  °Alkaline Phosphatase 39 - 117 U/L 38 (L)  °Albumin 3.5 - 5.2 g/dL 4.3  °AST 0 - 37 U/L 18  °ALT 0 - 35 U/L 14  °Total Protein 6.0 - 8.3 g/dL 6.7  °Total Bilirubin 0.2 - 1.2 mg/dL 0.6  °GFR >60.00 mL/min 66.97  ° °ASSESSMENT / PLAN / RECOMMENDATIONS:  ° °1) Type 2 Diabetes Mellitus, with improving glycemic control , With macrovascular - complications - Most recent A1c of 7.9 %. Goal A1c < 7.0 %.   ° °-I have praised the patient   praised the patient on improved glycemic control A1c down from 9.0% -I will adjust her NovoLog with the first meal of the day as that is where most of her hyperglycemia is noted - GLP-1 agonists and DPP-4 inhibitors are contra-indicated due to hx of pancreatitis   MEDICATIONS: -Continue Jardiance 25 mg, 1 tablet in the morning  -Continue Tresiba 44 units ONCE daily  -Change Novolog 14 units with brunch and continue 12 units with supper -Continue correction factor: NovoLog (BG -130/25)     EDUCATION / INSTRUCTIONS: BG monitoring instructions: Patient is instructed to check her blood sugars 3 times a day, before meals. Call [facility] if: BG persistently < 70  I reviewed  the Rule of 15 for the treatment of hypoglycemia in detail with the patient. Literature supplied.   2) Diabetic complications:  Eye: Does not have known diabetic retinopathy.  Neuro/ Feet: Does not have known diabetic peripheral neuropathy .  Renal: Patient does not have known baseline CKD. She   is  on an ACEI/ARB at present.      F/U in 4 months     Signed electronically by: Mack Guise, MD  [facility]  [facility] [address] [place], [place] [address] Phone: [phone] FAX: [phone]   CC: [address] [place] [address] Phone: [phone]  Fax: [phone]  Return to Endocrinology clinic as below: Future Appointments  Date Time Provider [place]  12/21/2021  9:40 AM Ann Held, DO [facility]  01/23/2022  1:15 PM [facility] PHARMACIST [facility]

## 2021-12-13 NOTE — Patient Instructions (Addendum)
-   Keep Up the Good Work ! A1c 7.9%  ? ?- Continue Jardiance 25 mg, 1 tablet in the morning  ?- Increase Tresiba 44 units ONCE daily  ?- Continue Novolog 14 units with Brunch , 12 units wit Supper  ?-Novolog correctional insulin: ADD extra units on insulin to your meal-time Novolog dose if your blood sugars are higher than 155. Use the scale below to help guide you:  ? ?Blood sugar before meal Number of units to inject  ?Less than 155 0 unit  ?156 -  180 1 units  ?181 -  205 2 units  ?206 -  230 3 units  ?231 -  255 4 units  ?256 -  280 5 units  ?281 -  305 6 units  ?306 -  330 7 units  ?331 -  355 8 units  ? ? ? ? ? ? ? ?HOW TO TREAT LOW BLOOD SUGARS (Blood sugar LESS THAN 70 MG/DL) ?Please follow the RULE OF 15 for the treatment of hypoglycemia treatment (when your (blood sugars are less than 70 mg/dL)  ? ?STEP 1: Take 15 grams of carbohydrates when your blood sugar is low, which includes:  ?3-4 GLUCOSE TABS  OR ?3-4 OZ OF JUICE OR REGULAR SODA OR ?ONE TUBE OF GLUCOSE GEL   ? ?STEP 2: RECHECK blood sugar in 15 MINUTES ?STEP 3: If your blood sugar is still low at the 15 minute recheck --> then, go back to STEP 1 and treat AGAIN with another 15 grams of carbohydrates ?

## 2021-12-21 ENCOUNTER — Encounter: Payer: Self-pay | Admitting: Family Medicine

## 2021-12-21 ENCOUNTER — Ambulatory Visit (INDEPENDENT_AMBULATORY_CARE_PROVIDER_SITE_OTHER): Payer: Medicare HMO | Admitting: Family Medicine

## 2021-12-21 VITALS — BP 155/52 | HR 53 | Temp 97.9°F | Resp 16 | Wt 171.0 lb

## 2021-12-21 DIAGNOSIS — F32A Depression, unspecified: Secondary | ICD-10-CM | POA: Diagnosis not present

## 2021-12-21 DIAGNOSIS — E785 Hyperlipidemia, unspecified: Secondary | ICD-10-CM

## 2021-12-21 DIAGNOSIS — E1169 Type 2 diabetes mellitus with other specified complication: Secondary | ICD-10-CM

## 2021-12-21 DIAGNOSIS — I10 Essential (primary) hypertension: Secondary | ICD-10-CM

## 2021-12-21 DIAGNOSIS — E1165 Type 2 diabetes mellitus with hyperglycemia: Secondary | ICD-10-CM

## 2021-12-21 LAB — COMPREHENSIVE METABOLIC PANEL
ALT: 18 U/L (ref 0–35)
AST: 18 U/L (ref 0–37)
Albumin: 4.5 g/dL (ref 3.5–5.2)
Alkaline Phosphatase: 36 U/L — ABNORMAL LOW (ref 39–117)
BUN: 21 mg/dL (ref 6–23)
CO2: 28 mEq/L (ref 19–32)
Calcium: 10 mg/dL (ref 8.4–10.5)
Chloride: 102 mEq/L (ref 96–112)
Creatinine, Ser: 0.88 mg/dL (ref 0.40–1.20)
GFR: 64.05 mL/min (ref >60.00)
Glucose, Bld: 129 mg/dL — ABNORMAL HIGH (ref 70–99)
Potassium: 3.9 mEq/L (ref 3.5–5.1)
Sodium: 138 mEq/L (ref 135–145)
Total Bilirubin: 0.7 mg/dL (ref 0.2–1.2)
Total Protein: 6.8 g/dL (ref 6.0–8.3)

## 2021-12-21 LAB — CBC WITH DIFFERENTIAL/PLATELET
Basophils Absolute: 0 10*3/uL (ref 0.0–0.1)
Basophils Relative: 0.6 % (ref 0.0–3.0)
Eosinophils Absolute: 0.1 10*3/uL (ref 0.0–0.7)
Eosinophils Relative: 0.9 % (ref 0.0–5.0)
HCT: 39 % (ref 36.0–46.0)
Hemoglobin: 12.9 g/dL (ref 12.0–15.0)
Lymphocytes Relative: 36.8 % (ref 12.0–46.0)
Lymphs Abs: 2.7 10*3/uL (ref 0.7–4.0)
MCHC: 33.1 g/dL (ref 30.0–36.0)
MCV: 90.2 fl (ref 78.0–100.0)
Monocytes Absolute: 0.6 10*3/uL (ref 0.1–1.0)
Monocytes Relative: 8.5 % (ref 3.0–12.0)
Neutro Abs: 3.8 10*3/uL (ref 1.4–7.7)
Neutrophils Relative %: 53.2 % (ref 43.0–77.0)
Platelets: 203 10*3/uL (ref 150.0–400.0)
RBC: 4.33 Mil/uL (ref 3.87–5.11)
RDW: 13.8 % (ref 11.5–15.5)
WBC: 7.2 10*3/uL (ref 4.0–10.5)

## 2021-12-21 LAB — LIPID PANEL
Cholesterol: 137 mg/dL (ref 0–200)
HDL: 41.7 mg/dL (ref >39.00)
LDL Cholesterol: 81 mg/dL (ref 0–99)
NonHDL: 95.22
Total CHOL/HDL Ratio: 3
Triglycerides: 72 mg/dL (ref 0.0–149.0)
VLDL: 14.4 mg/dL (ref 0.0–40.0)

## 2021-12-21 LAB — MICROALBUMIN / CREATININE URINE RATIO
Creatinine,U: 124 mg/dL
Microalb Creat Ratio: 31.7 mg/g — ABNORMAL HIGH (ref 0.0–30.0)
Microalb, Ur: 39.3 mg/dL — ABNORMAL HIGH (ref 0.0–1.9)

## 2021-12-21 MED ORDER — LOSARTAN POTASSIUM 100 MG PO TABS: 100.0000 mg | ORAL_TABLET | Freq: Every day | ORAL | 1 refills | Status: DC

## 2021-12-21 NOTE — Progress Notes (Signed)
? ?Subjective:  ? ?By signing my name below, I, Shehryar Baig, attest that this documentation has been prepared under the direction and in the presence of Ann Held, DO. 12/21/2021 ? ? ? Patient ID: [name], female    DOB: September 03, 1946, 76 y.o.   MRN: [id number] ? ?Chief Complaint  ?Patient presents with  ? Hypertension  ? Hyperlipidemia  ? Diabetes  ? Depression  ?  Needs refill on Xanax  ? ? ?HPI ?Patient is in today for a office visit.  ? ?She is requesting a refill for 0.25 mg xanax PRN.  ?Her blood sugars are measuring normal. She continues seeing her endocrinologist regularly to manage her diabetes. She typically has her endocrinologist complete her foot exams but did not receive a foot exam during her last endocrinologist visit on 12/13/2021.  ?Lab Results  ?Component Value Date  ? HGBA1C 7.9 (A) 12/13/2021  ? ?Her blood pressure is elevated during this visit. She occasionally measures her blood pressure at home and reports her systolic BP typically measures around 150. She continues taking 50 mg metoprolol succinate daily PO, 40 mg lisinopril daily PO, 10 mg amlodipine daily PO and reports no new issues while taking it. She denies having any swelling in her lower extremities at this time.  ?BP Readings from Last 3 Encounters:  ?12/21/21 (!) 155/52  ?12/13/21 120/72  ?11/11/21 133/73  ? ?Pulse Readings from Last 3 Encounters:  ?12/21/21 (!) 53  ?12/13/21 80  ?11/11/21 61  ? ?She has 2 Covid-19 vaccines at this time. She is eligible for the shingles vaccines and was informed of side effects and benefits.  ? ? ?Past Medical History:  ?Diagnosis Date  ? Blockage of coronary artery of heart (HCC)   ? Depression   ? Diabetes mellitus   ? Hyperlipidemia   ? Hypertension   ? Pancreatitis   ? Restless leg syndrome   ? ? ?Past Surgical History:  ?Procedure Laterality Date  ? CATARACT EXTRACTION, BILATERAL    ? CORONARY ANGIOPLASTY WITH STENT PLACEMENT    ? FOOT SURGERY Right   ? cyst removal  ?  ROTATOR CUFF REPAIR    ? right  ? ROTATOR CUFF REPAIR Left   ? TUBAL LIGATION    ? ? ?Family History  ?Problem Relation Age of Onset  ? Heart attack Father   ? Heart attack Brother   ? Diabetes Sister   ? Cirrhosis Sister   ? Hypertension Sister   ? Cancer Mother   ? Hypertension Mother   ? Vision loss Mother   ? Cancer Sister   ? Sleep apnea Sister   ? Heart Problems Sister   ? Diabetes Sister   ? Gout Sister   ? Diabetes Son   ? Heart attack Son   ? Diabetes Daughter   ? Goiter Daughter   ? Diabetes Daughter   ? ? ?Social History  ? ?Socioeconomic History  ? Marital status: Married  ?  Spouse name: Not on file  ? Number of children: Not on file  ? Years of education: Not on file  ? Highest education level: Not on file  ?Occupational History  ? Not on file  ?Tobacco Use  ? Smoking status: Former  ?  Packs/day: 0.20  ?  Types: Cigarettes  ?  Quit date: 05/05/2020  ?  Years since quitting: 1.6  ? Smokeless tobacco: Never  ? Tobacco comments:  ?  quit in 2021  ?Vaping Use  ?  Vaping Use: Never used  ?Substance and Sexual Activity  ? Alcohol use: No  ? Drug use: No  ? Sexual activity: Not on file  ?Other Topics Concern  ? Not on file  ?Social History Narrative  ? Exercise: seldom  ? Caffeine use: seldom  ? ?Social Determinants of Health  ? ?Financial Resource Strain: Medium Risk  ? Difficulty of Paying Living Expenses: Somewhat hard  ?Food Insecurity: Not on file  ?Transportation Needs: No Transportation Needs  ? Lack of Transportation (Medical): No  ? Lack of Transportation (Non-Medical): No  ?Physical Activity: Inactive  ? Days of Exercise per Week: 0 days  ? Minutes of Exercise per Session: 0 min  ?Stress: Not on file  ?Social Connections: Not on file  ?Intimate Partner Violence: Not on file  ? ? ?Outpatient Medications Prior to Visit  ?Medication Sig Dispense Refill  ? Alcohol Swabs (B-D SINGLE USE SWABS REGULAR) PADS USE AS DIRECTED 400 each 1  ? alendronate (FOSAMAX) 70 MG tablet TAKE ONE TABLET BY MOUTH ONCE  WEEKLY ON WEDNESDAY 12 tablet 3  ? ALPRAZolam (XANAX) 0.25 MG tablet Take 1 tablet by mouth three times a day if needed 60 tablet 0  ? amLODipine (NORVASC) 10 MG tablet TAKE ONE TABLET BY MOUTH ONCE DAILY 90 tablet 1  ? aspirin EC 81 MG tablet Take 81 mg by mouth daily.    ? atorvastatin (LIPITOR) 40 MG tablet Take 1 tablet (40 mg total) by mouth daily. 90 tablet 3  ? Blood Glucose Calibration (TAI DOC CONTROL) NORMAL SOLN     ? Blood Glucose Monitoring Suppl (TRUE METRIX AIR GLUCOSE METER) w/Device KIT 1 each by Does not apply route 4 (four) times daily. DX CODE E11.8 1 kit 0  ? Carboxymethylcellulose Sodium (THERATEARS OP) Place 1 drop into both eyes daily as needed (dry eyes).    ? Continuous Blood Gluc Sensor (DEXCOM G6 SENSOR) MISC 1 Device by Does not apply route as directed. 9 each 3  ? Continuous Blood Gluc Transmit (DEXCOM G6 TRANSMITTER) MISC 1 Device by Does not apply route as directed. 1 each 3  ? empagliflozin (JARDIANCE) 25 MG TABS tablet Take 1 tablet (25 mg total) by mouth daily before breakfast. 90 tablet 3  ? fenofibrate 160 MG tablet TAKE ONE TABLET BY MOUTH EVERY EVENING 90 tablet 1  ? ferrous sulfate 325 (65 FE) MG tablet Take 325 mg by mouth daily with breakfast.     ? fexofenadine (ALLEGRA) 180 MG tablet Take 180 mg by mouth daily as needed for allergies or rhinitis.    ? Flaxseed, Linseed, (FLAXSEED OIL PO) Take 1 capsule by mouth 2 (two) times daily.     ? FLUoxetine (PROZAC) 20 MG capsule Take 60 mg by mouth daily at 6 (six) AM.    ? fluticasone (FLONASE) 50 MCG/ACT nasal spray SHAKE LIQUID AND USE 2 SPRAYS IN EACH NOSTRIL DAILY 48 g 1  ? glucose blood (TRUE METRIX BLOOD GLUCOSE TEST) test strip USE AS DIRECTED 4 TIMES A DAY 400 strip 1  ? insulin aspart (NOVOLOG FLEXPEN) 100 UNIT/ML FlexPen Max daily 70 units 30 mL 6  ? insulin degludec (TRESIBA FLEXTOUCH) 100 UNIT/ML FlexTouch Pen Inject 44 Units into the skin daily. 45 mL 3  ? Insulin Pen Needle 32G X 4 MM MISC 1 Device by Does not apply  route 4 (four) times daily. 400 each 3  ? Lancet Devices (LANCING DEVICE) MISC     ? metoprolol succinate (TOPROL-XL) 50 MG  24 hr tablet TAKE ONE TABLET BY MOUTH ONCE DAILY WITH FOOD 90 tablet 1  ? Multiple Vitamin (MULTIVITAMIN WITH MINERALS) TABS Take 1 tablet by mouth daily.    ? Omega-3 Fatty Acids (FISH OIL) 1000 MG CAPS Take 1 capsule by mouth in the morning and at bedtime.    ? traMADol (ULTRAM) 50 MG tablet Take 1 tablet (50 mg total) by mouth 2 (two) times daily as needed. 60 tablet 2  ? TRUEplus Lancets 33G MISC USE AS DIRECTED 4 TIMES A DAY 400 each 1  ? vitamin E 400 UNIT capsule Take 400 Units by mouth daily.    ? lisinopril (ZESTRIL) 40 MG tablet Take 1 tablet (40 mg total) by mouth daily. 90 tablet 1  ? ?No facility-administered medications prior to visit.  ? ? ?Allergies  ?Allergen Reactions  ? Metformin And Related Other (See Comments)  ?  Sick on stomach, sweaty, throw up, pain  ? Levemir [Insulin Detemir] Hives and Swelling  ? Clindamycin Nausea And Vomiting  ? Crestor [Rosuvastatin Calcium] Palpitations  ? ? ?Review of Systems  ?Cardiovascular:  Negative for leg swelling.  ? ?   ?Objective:  ?  ?Physical Exam ?Constitutional:   ?   General: She is not in acute distress. ?   Appearance: Normal appearance. She is not ill-appearing.  ?HENT:  ?   Head: Normocephalic and atraumatic.  ?   Right Ear: External ear normal.  ?   Left Ear: External ear normal.  ?Eyes:  ?   Extraocular Movements: Extraocular movements intact.  ?   Pupils: Pupils are equal, round, and reactive to light.  ?Cardiovascular:  ?   Rate and Rhythm: Normal rate and regular rhythm.  ?   Heart sounds: Normal heart sounds. No murmur heard. ?  No gallop.  ?Pulmonary:  ?   Effort: Pulmonary effort is normal. No respiratory distress.  ?   Breath sounds: Normal breath sounds. No wheezing or rales.  ?Skin: ?   General: Skin is warm and dry.  ?Neurological:  ?   Mental Status: She is alert and oriented to person, place, and time.   ?Psychiatric:     ?   Judgment: Judgment normal.  ? ? ?BP (!) 155/52 (BP Location: Right Arm, Patient Position: Sitting, Cuff Size: Small)   Pulse (!) 53   Temp 97.9 ?F (36.6 ?C) (Oral)   Resp 16   Wt 171 lb (77.6 kg)

## 2021-12-21 NOTE — Patient Instructions (Signed)

## 2021-12-21 NOTE — Assessment & Plan Note (Signed)
Stable con't meds 

## 2021-12-21 NOTE — Assessment & Plan Note (Signed)
Encourage heart healthy diet such as MIND or DASH diet, increase exercise, avoid trans fats, simple carbohydrates and processed foods, consider a krill or fish or flaxseed oil cap daily.  °

## 2021-12-21 NOTE — Assessment & Plan Note (Signed)
hgba1c TO BE CHECKED, minimize simple carbs. Increase exercise as tolerated. Continue current meds ? ?

## 2021-12-21 NOTE — Assessment & Plan Note (Signed)
Well controlled, no changes to meds. Encouraged heart healthy diet such as the DASH diet and exercise as tolerated.  °

## 2021-12-26 ENCOUNTER — Telehealth: Payer: Self-pay | Admitting: Family Medicine

## 2021-12-26 NOTE — Telephone Encounter (Signed)
Pharmacy would like to know if losartan will be replacing pt's lisinopril. Please advise.  ?

## 2021-12-26 NOTE — Telephone Encounter (Signed)
I reviewed pt's last note and didn't see if we switching medication but saw the new order. Please advise ?

## 2021-12-26 NOTE — Telephone Encounter (Signed)
Pharmacy notified.

## 2022-01-05 DIAGNOSIS — E1165 Type 2 diabetes mellitus with hyperglycemia: Secondary | ICD-10-CM | POA: Diagnosis not present

## 2022-01-05 DIAGNOSIS — I10 Essential (primary) hypertension: Secondary | ICD-10-CM | POA: Diagnosis not present

## 2022-01-05 DIAGNOSIS — E785 Hyperlipidemia, unspecified: Secondary | ICD-10-CM

## 2022-01-18 NOTE — Progress Notes (Deleted)
? ?Subjective:  ? [name] is a 76 y.o. female who presents for Medicare Annual (Subsequent) preventive examination. ? ?I connected with  [name] on 01/18/22 by a audio enabled telemedicine application and verified that I am speaking with the correct person using two identifiers. ? ?Patient Location: Home ? ?Provider Location: Office/Clinic ? ?I discussed the limitations of evaluation and management by telemedicine. The patient expressed understanding and agreed to proceed.  ? ?Review of Systems    ? ?  ? ?   ?Objective:  ?  ?There were no vitals filed for this visit. ?There is no height or weight on file to calculate BMI. ? ? ?  11/11/2021  ?  1:20 PM 05/04/2021  ?  3:58 PM 09/23/2020  ?  2:31 PM 05/12/2020  ? 10:22 AM 05/11/2019  ?  1:11 PM 04/22/2019  ?  2:46 PM 10/14/2017  ?  9:45 PM  ?Advanced Directives  ?Does Patient Have a Medical Advance Directive? _0  No No  ?Would patient like information on creating a medical advance directive?   No - Patient declined No - Patient declined No - Patient declined    ? ? ?Current Medications (verified) ?Outpatient Encounter Medications as of 01/18/2022  ?Medication Sig  ? Alcohol Swabs (B-D SINGLE USE SWABS REGULAR) PADS USE AS DIRECTED  ? alendronate (FOSAMAX) 70 MG tablet TAKE ONE TABLET BY MOUTH ONCE WEEKLY ON WEDNESDAY  ? ALPRAZolam (XANAX) 0.25 MG tablet Take 1 tablet by mouth three times a day if needed  ? amLODipine (NORVASC) 10 MG tablet TAKE ONE TABLET BY MOUTH ONCE DAILY  ? aspirin EC 81 MG tablet Take 81 mg by mouth daily.  ? atorvastatin (LIPITOR) 40 MG tablet Take 1 tablet (40 mg total) by mouth daily.  ? Blood Glucose Calibration (TAI DOC CONTROL) NORMAL SOLN   ? Blood Glucose Monitoring Suppl (TRUE METRIX AIR GLUCOSE METER) w/Device KIT 1 each by Does not apply route 4 (four) times daily. DX CODE E11.8  ? Carboxymethylcellulose Sodium (THERATEARS OP) Place 1 drop into both eyes daily as needed (dry eyes).  ? Continuous Blood Gluc Sensor  (DEXCOM G6 SENSOR) MISC 1 Device by Does not apply route as directed.  ? Continuous Blood Gluc Transmit (DEXCOM G6 TRANSMITTER) MISC 1 Device by Does not apply route as directed.  ? empagliflozin (JARDIANCE) 25 MG TABS tablet Take 1 tablet (25 mg total) by mouth daily before breakfast.  ? fenofibrate 160 MG tablet TAKE ONE TABLET BY MOUTH EVERY EVENING  ? ferrous sulfate 325 (65 FE) MG tablet Take 325 mg by mouth daily with breakfast.   ? fexofenadine (ALLEGRA) 180 MG tablet Take 180 mg by mouth daily as needed for allergies or rhinitis.  ? Flaxseed, Linseed, (FLAXSEED OIL PO) Take 1 capsule by mouth 2 (two) times daily.   ? FLUoxetine (PROZAC) 20 MG capsule Take 60 mg by mouth daily at 6 (six) AM.  ? fluticasone (FLONASE) 50 MCG/ACT nasal spray SHAKE LIQUID AND USE 2 SPRAYS IN EACH NOSTRIL DAILY  ? glucose blood (TRUE METRIX BLOOD GLUCOSE TEST) test strip USE AS DIRECTED 4 TIMES A DAY  ? insulin aspart (NOVOLOG FLEXPEN) 100 UNIT/ML FlexPen Max daily 70 units  ? insulin degludec (TRESIBA FLEXTOUCH) 100 UNIT/ML FlexTouch Pen Inject 44 Units into the skin daily.  ? Insulin Pen Needle 32G X 4 MM MISC 1 Device by Does not apply route 4 (four) times daily.  ? Lancet Devices (LANCING DEVICE) MISC   ?  losartan (COZAAR) 100 MG tablet Take 1 tablet (100 mg total) by mouth daily.  ? metoprolol succinate (TOPROL-XL) 50 MG 24 hr tablet TAKE ONE TABLET BY MOUTH ONCE DAILY WITH FOOD  ? Multiple Vitamin (MULTIVITAMIN WITH MINERALS) TABS Take 1 tablet by mouth daily.  ? Omega-3 Fatty Acids (FISH OIL) 1000 MG CAPS Take 1 capsule by mouth in the morning and at bedtime.  ? traMADol (ULTRAM) 50 MG tablet Take 1 tablet (50 mg total) by mouth 2 (two) times daily as needed.  ? TRUEplus Lancets 33G MISC USE AS DIRECTED 4 TIMES A DAY  ? vitamin E 400 UNIT capsule Take 400 Units by mouth daily.  ? ?No facility-administered encounter medications on file as of 01/18/2022.  ? ? ?Allergies (verified) ?Metformin and related, Levemir [insulin  detemir], Clindamycin, and Crestor [rosuvastatin calcium]  ? ?History: ?Past Medical History:  ?Diagnosis Date  ? Blockage of coronary artery of heart (HCC)   ? Depression   ? Diabetes mellitus   ? Hyperlipidemia   ? Hypertension   ? Pancreatitis   ? Restless leg syndrome   ? ?Past Surgical History:  ?Procedure Laterality Date  ? CATARACT EXTRACTION, BILATERAL    ? CORONARY ANGIOPLASTY WITH STENT PLACEMENT    ? FOOT SURGERY Right   ? cyst removal  ? ROTATOR CUFF REPAIR    ? right  ? ROTATOR CUFF REPAIR Left   ? TUBAL LIGATION    ? ?Family History  ?Problem Relation Age of Onset  ? Heart attack Father   ? Heart attack Brother   ? Diabetes Sister   ? Cirrhosis Sister   ? Hypertension Sister   ? Cancer Mother   ? Hypertension Mother   ? Vision loss Mother   ? Cancer Sister   ? Sleep apnea Sister   ? Heart Problems Sister   ? Diabetes Sister   ? Gout Sister   ? Diabetes Son   ? Heart attack Son   ? Diabetes Daughter   ? Goiter Daughter   ? Diabetes Daughter   ? ?Social History  ? ?Socioeconomic History  ? Marital status: Married  ?  Spouse name: Not on file  ? Number of children: Not on file  ? Years of education: Not on file  ? Highest education level: Not on file  ?Occupational History  ? Not on file  ?Tobacco Use  ? Smoking status: Former  ?  Packs/day: 0.20  ?  Types: Cigarettes  ?  Quit date: 05/05/2020  ?  Years since quitting: 1.7  ? Smokeless tobacco: Never  ? Tobacco comments:  ?  quit in 2021  ?Vaping Use  ? Vaping Use: Never used  ?Substance and Sexual Activity  ? Alcohol use: No  ? Drug use: No  ? Sexual activity: Not on file  ?Other Topics Concern  ? Not on file  ?Social History Narrative  ? Exercise: seldom  ? Caffeine use: seldom  ? ?Social Determinants of Health  ? ?Financial Resource Strain: Medium Risk  ? Difficulty of Paying Living Expenses: Somewhat hard  ?Food Insecurity: Not on file  ?Transportation Needs: No Transportation Needs  ? Lack of Transportation (Medical): No  ? Lack of Transportation  (Non-Medical): No  ?Physical Activity: Inactive  ? Days of Exercise per Week: 0 days  ? Minutes of Exercise per Session: 0 min  ?Stress: Not on file  ?Social Connections: Not on file  ? ? ?Tobacco Counseling ?Counseling given: Not Answered ?Tobacco comments: quit in  2021 ? ? ?Clinical Intake: ? ?  ? ?  ? ?  ? ?  ? ?  ? ?Diabetic?Yes ?Nutrition Risk Assessment: ? ?Has the patient had any N/V/D within the last 2 months?  {YES/NO:21197} ?Does the patient have any non-healing wounds?  {YES/NO:21197} ?Has the patient had any unintentional weight loss or weight gain?  {YES/NO:21197} ? ?Diabetes: ? ?Is the patient diabetic?  {YES/NO:21197} ?If diabetic, was a CBG obtained today?  {YES/NO:21197} ?Did the patient bring in their glucometer from home?  {YES/NO:21197} ?How often do you monitor your CBG's? ***.  ? ?Financial Strains and Diabetes Management: ? ?Are you having any financial strains with the device, your supplies or your medication? {YES/NO:21197}.  ?Does the patient want to be seen by Chronic Care Management for management of their diabetes?  {YES/NO:21197} ?Would the patient like to be referred to a Nutritionist or for Diabetic Management?  {YES/NO:21197} ? ?Diabetic Exams: ? ?{Diabetic Eye Exam:2101801} ?{Diabetic Foot Exam:2101802}  ? ?  ? ?  ? ? ?Activities of Daily Living ? ?  06/20/2021  ?  3:31 PM  ?In your present state of health, do you have any difficulty performing the following activities:  ?Hearing? 0  ?Vision? 0  ?Difficulty concentrating or making decisions? 0  ?Walking or climbing stairs? 0  ?Dressing or bathing? 0  ?Doing errands, shopping? 0  ? ? ?Patient Care Team: ?Carollee Herter, Alferd Apa, DO as PCP - General ?Buford Dresser, MD as PCP - Cardiology (Cardiology) ?Ashok Pall, MD as Consulting Physician (Neurosurgery) ?Monna Fam, MD as Consulting Physician (Ophthalmology) ?Cherre Robins, RPH-CPP (Pharmacist) ?Shamleffer, Melanie Crazier, MD as Consulting Physician  (Endocrinology) ? ?Indicate any recent Medical Services you may have received from other than [facility] providers in the past year (date may be approximate). ? ?   ?Assessment:  ? This is a routine wellness examination for [place]. ? ?

## 2022-01-21 ENCOUNTER — Other Ambulatory Visit: Payer: Self-pay | Admitting: Internal Medicine

## 2022-01-22 DIAGNOSIS — E1165 Type 2 diabetes mellitus with hyperglycemia: Secondary | ICD-10-CM | POA: Diagnosis not present

## 2022-01-23 ENCOUNTER — Telehealth: Payer: Medicare HMO

## 2022-01-23 ENCOUNTER — Telehealth: Payer: Self-pay | Admitting: Pharmacist

## 2022-01-23 NOTE — Telephone Encounter (Signed)
Patient called for Chronic Care Management appointment. She reported she had someone just pull into her driveway. She stated she would call back in 10 minutes but did not return call. Will continue to try to outreach patient for follow up. ?

## 2022-01-25 ENCOUNTER — Ambulatory Visit (INDEPENDENT_AMBULATORY_CARE_PROVIDER_SITE_OTHER): Payer: Medicare HMO | Admitting: Pharmacist

## 2022-01-25 VITALS — BP 126/72

## 2022-01-25 DIAGNOSIS — E1165 Type 2 diabetes mellitus with hyperglycemia: Secondary | ICD-10-CM

## 2022-01-25 DIAGNOSIS — E785 Hyperlipidemia, unspecified: Secondary | ICD-10-CM

## 2022-01-25 DIAGNOSIS — I10 Essential (primary) hypertension: Secondary | ICD-10-CM

## 2022-01-25 MED ORDER — FLUTICASONE PROPIONATE 50 MCG/ACT NA SUSP: 2.0000 | Freq: Every day | NASAL | 1 refills | Status: DC

## 2022-01-25 NOTE — Telephone Encounter (Signed)
Patient called back but I was on the phone. I called her back - see phone visit note 01/25/2022 ?

## 2022-01-25 NOTE — Patient Instructions (Signed)
[name] ?It was a pleasure speaking with you  ?Below is a summary of your health goals and care plan ? ?Patient Goals/Self-Care Activities ?take medications as prescribed,  ?check glucose with CGM, document, and provide at future appointments, and  ?collaborate with provider on medication access solutions ?stop by office to have blood pressure check - Can ome with husband 02/22/2022 to office visit and have blood pressure checked (ask or [name] and I will check blood pressure) ? ? ?If you have any questions or concerns, please feel free to contact me either at the phone number below or with a MyChart message.  ? ?Keep up the good work! ? ?Cherre Robins, PharmD ?Clinical Pharmacist ?[facility] ?[place] ?[phone] (direct line)  ?[phone] (main office number) ? ? ?Chronic Care Management Care Plan ?  ?Hypertension ?BP Readings from Last 3 Encounters:  ?01/25/22 126/72  ?12/21/21 (!) 155/52  ?12/13/21 120/72  ? ?Pharmacist Clinical Goal(s): ?Over the next 90 days, patient will work with PharmD and providers to maintain BP goal <130/80 ?Current regimen:  ?Amlodipine 10mg  daily ?Losartan 100mg  daily ?Metoprolol Succinate 50mg  daily ?Interventions: ?Requested patient to check her blood pressure 2 times per week and record ?Patient self care activities - Over the next 90 days, patient will: ?Check blood pressure 2 times per week, document, and provide at future appointments ?Ensure daily salt intake < 2300 mg/day ?Continue current regimen for blood pressure ? ?Hyperlipidemia / Heart Disease ?Lab Results  ?Component Value Date/Time  ? [place] 81 12/21/2021 10:25 AM  ? LDLCALC 61 06/15/2020 08:19 AM  ? LDLDIRECT 53.0 04/24/2021 03:42 PM  ? ?Pharmacist Clinical Goal(s): ?Over the next 90 days, patient will work with PharmD and providers to maintain LDL goal < 70 ?Current regimen:  ?Aspirin 81mg  daily ?Fenofibrate 160mg  daily ?Omega 3 fish Oil 1030mg  twice daily ?Atorvastatin 40mg   daily ?Interventions: ?Reviewed last lipid panel with patient. Has maintained LDL at goal and Tg improved form 344 in 2021 to 145 on 12/06/2020 ?Patient self care activities - Over the next 90 days, patient will: ?Continue current regimen for heart health and cholesterol ?Continue to follow low fat diet ? ?Diabetes ?Lab Results  ?Component Value Date/Time  ? HGBA1C 7.9 (A) 12/13/2021 10:06 AM  ? HGBA1C 9.0 (A) 09/13/2021 09:48 AM  ? HGBA1C 8.4 (H) 04/24/2021 03:42 PM  ? HGBA1C 8.4 (H) 02/16/2021 09:37 AM  ? ?Pharmacist Clinical Goal(s): ?Over the next 180 days, patient will work with PharmD and providers to achieve A1c goal <7% ?Current regimen:  ?Treiba 40 units daily ?Novolog - 12 units plus additional correction based on scale from Dr Kelton Pillar ?Jardiance 25mg  - take 1 tablet each morning ?Interventions: ?Discussed DM goals ?Fasting (before meal blood sugar): 80-130 ?Post-Prandial (2 hrs after eating blood sugar): less than 180 ?A1c (3 month blood sugar average): less than 7% ?Helped patient apply for LIS - patient has been approved brand name medications will be $10.35 and generics either $0 or $4.95. ?Patient self care activities - Over the next 90 days, patient will: ?Continue to check blood glucose prior to meals and at bedtime ?Contact provider with any episodes of hypoglycemia  ? ? ?Depression / anxiety ?Pharmacist Clinical Goal(s) ?Over the next 90 days, patient will work with PharmD and providers to reduce symptoms associated with depression ?Current regimen:  ?Alprazolam 0.25mg  three times daily as needed  ?Fluoxetine 60mg  daily - take 3 capsules of 20mg  to equal 60mg  daily ?Interventions: ?None today ?Patient self care activities -  Over the next 90 days, patient will: ?Continue current regimen ? ?Osteoporosis ?Pharmacist Clinical Goal(s) ?Over the next 180 days, patient will work with PharmD and providers to reduce risk of fracture due to osteoporosis  ?Current regimen:  ?Alendronate 70mg  weekly ?Patient  self care activities - Over the next 180 days, patient will: ?Consider repeat DEXA Scan ? ?Shoulder Pain:  ?Pharmacist Clinical Goal(s) ?Over the next 180 days, patient will work with PharmD and providers to improve pain and improve BG control to A1c <8.0 so patient can be scheduled for needed shoulder surgery ?Current regimen:  ?Tramadol 50mg  up to 3 times a day if needed for pain ?Interventions: ?Discuss importance of taking diabetes meds daily and getting A1c <8.0 so surgery can be scheduled.  ?Patient self care activities - Over the next 180 days, patient will: ?Continue to follow up with orthopedic office for pain management  ? ?Medication management ?Pharmacist Clinical Goal(s): ?Over the next 90 days, patient will work with PharmD and providers to maintain optimal medication adherence ?Current pharmacy: [facility] switched to [facility] ?Interventions ?Comprehensive medication review performed. ?Utilize [facility] pharmacy for medication synchronization, packaging and delivery ?Patient self care activities - Over the next 90 days, patient will: ?Focus on medication adherence by filling and taking medications appropriately  ?Take medications as prescribed ?Report any questions or concerns to PharmD and/or provider(s) ? ? ? ? ?Patient verbalizes understanding of instructions and care plan provided today and agrees to view in [facility]. Active MyChart status confirmed with patient.    ?

## 2022-01-25 NOTE — Chronic Care Management (AMB) (Signed)
? ? ?Chronic Care Management ?Pharmacy Note ? ?01/25/2022 ?Name:  [name] MRN:  [id number] DOB:  [birth year] ? ?Summary:  ?Assisted patient in applying for low income subsidy with Medicare earlier in 2023. She has been approved and brand meds are $10.35 and generic either $0 ro $4.95. Coordinated with pharmacy for adherence packaging and sent in prescription for fluticasone Nasal spray.  ?Blood pressure today at home was 126/72. Blood pressure has improved since PCP changed lisinopril to losartan 164m daily. Patient advised to come in to office for blood pressure check. She will come when her husband is in office 02/24/2022. . ? ?Subjective: ?Emily SCHOUis an 76y.o. year old female who is a primary patient of LAnn Held DO.  The CCM team was consulted for assistance with disease management and care coordination needs.   ? ?Engaged with patient by telephone for  to follow up type 2 DM and chronic conditions  in response to provider referral for pharmacy case management and/or care coordination services.  ? ?Consent to Services:  ?The patient was given information about Chronic Care Management services, agreed to services, and gave verbal consent prior to initiation of services.  Please see initial visit note for detailed documentation.  ? ?Patient Care Team: ?LCarollee Herter YAlferd Apa DO as PCP - General ?CBuford Dresser MD as PCP - Cardiology (Cardiology) ?CAshok Pall MD as Consulting Physician (Neurosurgery) ?HMonna Fam MD as Consulting Physician (Ophthalmology) ?ECherre Robins RPH-CPP (Pharmacist) ?Shamleffer, IMelanie Crazier MD as Consulting Physician (Endocrinology) ? ?Recent office visits: ?09/18/2021 - Fam Med (Dr LEtter SjogrenCheri Rous Follow up hyperlipidemia. No med changes.  ?07/17/2021 - Fam Med (Dr LEtter Sjogren F/U depression and anxiety. Using alprazolam less since fluoxetine started. Changed rosuvastatin 264mto atorvastatin 407mecuase patient felt rosuvastatin was causing  palpitations at night. Ultrasound of lower extremities ordered - no DVT noted.  ? ?Recent consult visits: ?09/13/2021 - Endo (Dr ShaKelton Pillar/U diabetes. A1c 9.0% increased Tresiba to 44 units daily. -Continue Jardiance 25 mg, 1 tablet in the morning ?-Continue Novolog 12 units with each meal and correction factor: NovoLog (BG -130/25) ?08/08/2021 - GI (Dr JacArdis Hughseen for colon cancer screening. Discussed the risks and benefits to screening via colonoscopy versus screening via Cologuard stool based test.  She decided to go ahead with Cologuard stool based test ?  ? ?Hospital visits: ?[facility] She saw an orthopedist in August of last year who was going to refer her to Dr. NunKathyrn Sheriffth neurosurgery as it was felt that this was likely radicular pain from her back.  She said that she never did follow-up with a neurosurgeon. suspect that she may have some radicular pain from her back.  I discussed options.  At this point we can prescribe her a short course of pain medication.  She has taken Vicodin before and opted for this.  I offered referral to a back specialist.  She states that she will follow-up with her PCP regarding this.  She was discharged home in good condition.  Return precautions were given. ?Objective: ? ?Lab Results  ?Component Value Date  ? CREATININE 0.88 12/21/2021  ? CREATININE 0.75 09/18/2021  ? CREATININE 0.85 07/17/2021  ? ? ?Lab Results  ?Component Value Date  ? HGBA1C 7.9 (A) 12/13/2021  ? ?Last diabetic Eye exam:  ?Lab Results  ?Component Value Date/Time  ? HMDIABEYEEXA Retinopathy (A) 12/30/2020 12:00 AM  ?  ?Last diabetic Foot exam:  No results found for: HMDIABFOOTEX  - Done 12/29/2020 by Dr Kelton Pillar. ? ?   ?Component Value Date/Time  ? CHOL 137 12/21/2021 1025  ? TRIG 72.0 12/21/2021 1025  ? HDL 41.70 12/21/2021 1025  ? CHOLHDL 3 12/21/2021 1025  ? VLDL 14.4 12/21/2021 1025  ? [place] 81 12/21/2021 1025  ? [place] 61 06/15/2020 0819   ? LDLDIRECT 53.0 04/24/2021 1542  ? ? ? ?  Latest Ref Rng & Units 12/21/2021  ? 10:25 AM 09/18/2021  ? 11:12 AM 07/17/2021  ?  9:12 AM  ?Hepatic Function  ?Total Protein 6.0 - 8.3 g/dL 6.8   6.4   6.7    ?Albumin 3.5 - 5.2 g/dL 4.5   4.1   4.3    ?AST 0 - 37 U/L '18   15   18    ' ?ALT 0 - 35 U/L '18   15   14    ' ?Alk Phosphatase 39 - 117 U/L 36   42   38    ?Total Bilirubin 0.2 - 1.2 mg/dL 0.7   0.9   0.6    ? ? ?Lab Results  ?Component Value Date/Time  ? TSH 0.58 08/11/2018 02:34 PM  ? TSH 0.57 07/16/2018 09:50 AM  ? FREET4 0.74 08/11/2018 02:34 PM  ? FREET4 0.84 04/16/2013 11:19 AM  ? ? ? ?  Latest Ref Rng & Units 12/21/2021  ? 10:25 AM 12/06/2020  ?  4:03 PM 09/23/2020  ?  2:38 PM  ?CBC  ?WBC 4.0 - 10.5 K/uL 7.2   7.0   13.9    ?Hemoglobin 12.0 - 15.0 g/dL 12.9   12.2   13.5    ?Hematocrit 36.0 - 46.0 % 39.0   36.6   40.1    ?Platelets 150.0 - 400.0 K/uL 203.0   182.0   215    ? ? ?No results found for: VD25OH ? ?Clinical ASCVD: Yes  ?The 10-year ASCVD risk score (Arnett DK, et al., 2019) is: 22.3% ?  Values used to calculate the score: ?    Age: 12 years ?    Sex: Female ?    Is Non-Hispanic African American: Yes ?    Diabetic: Yes ?    Tobacco smoker: No ?    Systolic Blood Pressure: 357 mmHg ?    Is BP treated: Yes ?    HDL Cholesterol: 41.7 mg/dL ?    Total Cholesterol: 137 mg/dL   ? ?Other: DEXA 05/29/2017 ?Forearm Radius 33% T-score = -2.5.  ?DualFemur Neck Left  T-Score = -2.0; Which was a statistically significant increase in BMD of the left hip since prior exam dated 12/17/2013. ? ?Social History  ? ?Tobacco Use  ?Smoking Status Former  ? Packs/day: 0.20  ? Types: Cigarettes  ? Quit date: 05/05/2020  ? Years since quitting: 1.7  ?Smokeless Tobacco Never  ?Tobacco Comments  ? quit in 2021  ? ?BP Readings from Last 3 Encounters:  ?01/25/22 126/72  ?12/21/21 (!) 155/52  ?12/13/21 120/72  ? ?Pulse Readings from Last 3 Encounters:  ?12/21/21 (!) 53  ?12/13/21 80  ?11/11/21 61  ? ?Wt Readings from Last 3 Encounters:   ?12/21/21 171 lb (77.6 kg)  ?12/13/21 172 lb (78 kg)  ?09/18/21 168 lb 12.8 oz (76.6 kg)  ? ? ?Assessment: Review of patient past medical history, allergies, medications, health status, including review of consultants reports, laboratory and other test data, was performed as part of comprehensive evaluation and provision of chronic care management  services.  ? ?SDOH:  (Social Determinants of Health) assessments and interventions performed:  ? ? ? ? ? ?[place] ? ?Allergies  ?Allergen Reactions  ? Metformin And Related Other (See Comments)  ?  Sick on stomach, sweaty, throw up, pain  ? Levemir [Insulin Detemir] Hives and Swelling  ? Clindamycin Nausea And Vomiting  ? Crestor [Rosuvastatin Calcium] Palpitations  ? ? ?Medications Reviewed Today   ? ? Reviewed by Cherre Robins, RPH-CPP (Pharmacist) on 01/25/22 at 1013  Med List Status: <None>  ? ?Medication Order Taking? Sig Documenting Provider Last Dose Status Informant  ?Alcohol Swabs (B-D SINGLE USE SWABS REGULAR) PADS [id number]  USE AS DIRECTED Ann Held, DO  Active   ?alendronate (FOSAMAX) 70 MG tablet [id number] Yes TAKE ONE TABLET BY MOUTH ONCE WEEKLY ON WEDNESDAY Ann Held, DO Taking Active   ?ALPRAZolam (XANAX) 0.25 MG tablet [id number] Yes Take 1 tablet by mouth three times a day if needed Roma Schanz R, DO Taking Active   ?amLODipine (NORVASC) 10 MG tablet [id number]  TAKE ONE TABLET BY MOUTH ONCE DAILY Ann Held, DO  Active   ?aspirin EC 81 MG tablet [id number]  Take 81 mg by mouth daily. [provider]  Active Self  ?atorvastatin (LIPITOR) 40 MG tablet [id number]  Take 1 tablet (40 mg total) by mouth daily. Ann Held, DO  Active   ?Blood Glucose Calibration (TAI DOC CONTROL) NORMAL SOLN [id number]   [provider]  Active Self  ?Blood Glucose Monitoring Suppl (TRUE METRIX AIR GLUCOSE METER) w/Device KIT [id number] No 1 each by Does not apply route 4 (four) times daily. DX CODE E11.8   ?Patient not taking: Reported on 01/25/2022  ? Ann Held, DO Not Taking Active   ?Carboxymethylcellulose Sodium (THERATEARS OP) [id number] Yes Place 1 drop into both eyes daily as needed

## 2022-01-30 ENCOUNTER — Other Ambulatory Visit: Payer: Self-pay | Admitting: Family Medicine

## 2022-01-30 DIAGNOSIS — I10 Essential (primary) hypertension: Secondary | ICD-10-CM

## 2022-01-31 ENCOUNTER — Other Ambulatory Visit: Payer: Self-pay | Admitting: Family Medicine

## 2022-01-31 DIAGNOSIS — I10 Essential (primary) hypertension: Secondary | ICD-10-CM

## 2022-02-02 ENCOUNTER — Telehealth: Payer: Self-pay | Admitting: Pharmacist

## 2022-02-02 NOTE — Telephone Encounter (Signed)
[facility] technician called back. They will send a bottle of losartan 100mg  out to patient this afternoon. Sorry that was left out of packaging. Patient is aware.  ?

## 2022-02-02 NOTE — Telephone Encounter (Signed)
Patient called to ask who her cardiologist was. Patient was last seen by Dr Buford Dresser. Patient has been seen by the research team recently but not by Dr Harrell Gave. Recommended she make appointment for follow up. ? ?Patient also mentioned that she received her adherence packaging from her pharmacy and losartan is not in packaging. Contacted [facility]. It does not appears that losartan was filled with other medications recently. Left a message with technician that is assigned to [name] to call me back to try to get Rx filled and sent out to patient.  ? ? ?

## 2022-02-04 DIAGNOSIS — E785 Hyperlipidemia, unspecified: Secondary | ICD-10-CM | POA: Diagnosis not present

## 2022-02-04 DIAGNOSIS — I10 Essential (primary) hypertension: Secondary | ICD-10-CM

## 2022-02-04 DIAGNOSIS — M1711 Unilateral primary osteoarthritis, right knee: Secondary | ICD-10-CM

## 2022-02-04 DIAGNOSIS — E1159 Type 2 diabetes mellitus with other circulatory complications: Secondary | ICD-10-CM

## 2022-02-04 DIAGNOSIS — M810 Age-related osteoporosis without current pathological fracture: Secondary | ICD-10-CM | POA: Diagnosis not present

## 2022-02-04 DIAGNOSIS — I2510 Atherosclerotic heart disease of native coronary artery without angina pectoris: Secondary | ICD-10-CM

## 2022-02-06 ENCOUNTER — Ambulatory Visit (HOSPITAL_BASED_OUTPATIENT_CLINIC_OR_DEPARTMENT_OTHER): Payer: Medicare HMO | Admitting: Family

## 2022-02-06 ENCOUNTER — Encounter (HOSPITAL_BASED_OUTPATIENT_CLINIC_OR_DEPARTMENT_OTHER): Payer: Self-pay | Admitting: Family

## 2022-02-06 VITALS — BP 144/70 | HR 55 | Ht 65.0 in | Wt 176.8 lb

## 2022-02-06 DIAGNOSIS — Z794 Long term (current) use of insulin: Secondary | ICD-10-CM | POA: Diagnosis not present

## 2022-02-06 DIAGNOSIS — I10 Essential (primary) hypertension: Secondary | ICD-10-CM

## 2022-02-06 DIAGNOSIS — I25118 Atherosclerotic heart disease of native coronary artery with other forms of angina pectoris: Secondary | ICD-10-CM

## 2022-02-06 DIAGNOSIS — E118 Type 2 diabetes mellitus with unspecified complications: Secondary | ICD-10-CM | POA: Diagnosis not present

## 2022-02-06 DIAGNOSIS — E785 Hyperlipidemia, unspecified: Secondary | ICD-10-CM

## 2022-02-06 NOTE — Progress Notes (Signed)
? ?Office Visit  ?  ?Patient Name: [name] ?Date of Encounter: 02/06/2022 ? ?PCP:  Ann Held, DO ?  ?[place]  ?Cardiologist:  Buford Dresser, MD  ?Advanced Practice Provider:  No care team member to display ?Electrophysiologist:  None  ? ?Chief Complaint  ?  ?[name] is a 76 y.o. female with a hx of CAD, DM 2, hyperlipidemia, hypertension presents today for follow up of coronary disease  ? ?Past Medical History  ?  ?Past Medical History:  ?Diagnosis Date  ? Blockage of coronary artery of heart (HCC)   ? Depression   ? Diabetes mellitus   ? Hyperlipidemia   ? Hypertension   ? Pancreatitis   ? Restless leg syndrome   ? ?Past Surgical History:  ?Procedure Laterality Date  ? CATARACT EXTRACTION, BILATERAL    ? CORONARY ANGIOPLASTY WITH STENT PLACEMENT    ? FOOT SURGERY Right   ? cyst removal  ? ROTATOR CUFF REPAIR    ? right  ? ROTATOR CUFF REPAIR Left   ? TUBAL LIGATION    ? ? ?Allergies ? ?Allergies  ?Allergen Reactions  ? Metformin And Related Other (See Comments)  ?  Sick on stomach, sweaty, throw up, pain  ? Levemir [Insulin Detemir] Hives and Swelling  ? Clindamycin Nausea And Vomiting  ? Crestor [Rosuvastatin Calcium] Palpitations  ? ? ?History of Present Illness  ?  ?[name] is a 76 y.o. female with a hx of CAD, DM 2, hyperlipidemia, hypertension last seen 05/23/20. ? ?Prior history of cardiac stents the uncertain where, what type, how many.  Last cath 2001 by Dr. Karlton Lemon.  Echo and Lexi scan ordered in 2017 due to fatigue.  Echo 02/2016 normal LVEF 60 to [id number], grade 1 diastolic dysfunction, mild MR.  Myoview 02/19/2016 with no reversible ischemia or infarction, EF 58%, low risk study. ? ?Last seen in clinic 05/23/2020 by Dr. Harrell Gave.  Doing overall well from a cardiac perspective. ? ?She presents today for follow-up. Pleasant lady who lives with her husband and grandchildren who are 11 and 61 years old. Excited for upcoming trip to  [place] with her family to celebrate her granddaughter's graduation. Tells me she gets occasional left arm pain which she attributes to gas. It improves with gas tablets. This happens a few times per month and resolves quickly. Overall not bothersome. She has no formal exercise routine, but does do yard work. Interested in more exercise. Reports no significant lightheadedness, dizziness. No dyspnea, orthopnea, PND. Checks her blood pressure twice per week with readings 126/82.  ? ?EKGs/Labs/Other Studies Reviewed:  ? ?The following studies were reviewed today: ? ? ?EKG:  EKG is  ordered today.  The ekg ordered today demonstrates SB 55 bpm with LAD no acute ST/T wave changes.  ? ?Recent Labs: ?12/21/2021: ALT 18; BUN 21; Creatinine, Ser 0.88; Hemoglobin 12.9; Platelets 203.0; Potassium 3.9; Sodium 138  ?Recent Lipid Panel ?   ?Component Value Date/Time  ? CHOL 137 12/21/2021 1025  ? TRIG 72.0 12/21/2021 1025  ? HDL 41.70 12/21/2021 1025  ? CHOLHDL 3 12/21/2021 1025  ? VLDL 14.4 12/21/2021 1025  ? [place] 81 12/21/2021 1025  ? [place] 61 06/15/2020 0819  ? LDLDIRECT 53.0 04/24/2021 1542  ? ?Home Medications  ? ?No outpatient medications have been marked as taking for the 02/06/22 encounter (Appointment) with Loel Dubonnet, NP.  ?  ? ?Review of Systems  ?   ?All other  systems reviewed and are otherwise negative except as noted above. ? ?Physical Exam  ?  ?VS:  There were no vitals taken for this visit. , BMI There is no height or weight on file to calculate BMI. ? ?Wt Readings from Last 3 Encounters:  ?12/21/21 171 lb (77.6 kg)  ?12/13/21 172 lb (78 kg)  ?09/18/21 168 lb 12.8 oz (76.6 kg)  ?  ? ?GEN: Well nourished, well developed, in no acute distress. ?HEENT: normal. ?Neck: Supple, no JVD, carotid bruits, or masses. ?Cardiac: RRR, no murmurs, rubs, or gallops. No clubbing, cyanosis, edema.  Radials/PT 2+ and equal bilaterally.  ?Respiratory:  Respirations regular and unlabored, clear to auscultation  bilaterally. ?GI: Soft, nontender, nondistended. ?MS: No deformity or atrophy. ?Skin: Warm and dry, no rash. ?Neuro:  Strength and sensation are intact. ?Psych: Normal affect. ? ?Assessment & Plan  ?  ?CAD - Stable with no anginal symptoms. Her current intermittent left arm pain is relieved by gas tablet, EKG today no acute ST/T wave changes, no indication for ischemic evaluation.  GDMT includes aspirin, Atorvastatin, Metoprolol. Heart healthy diet and regular cardiovascular exercise encouraged.  Referred to PREP program at [facility]. ? ?DM2 -follows with endocrinology. ? ?HTN - BP elevated in clinic but well controlled at home. She will contact our office if her blood pressure is consistently more than 130/80. Continue current antihypertensive regimen Amlodipine 10mg  QD, Losartan 100mg  QD, Toprol 50mg  daily. Heart healthy diet and regular cardiovascular exercise encouraged.   ? ?HLD, LDL goal <70 -12/21/2021 total cholesterol 137, triglycerides 72, HDL 41, LDL 81. Continue Atorvastatin 40mg  daily, Fenofibrate 160mg  daily. Discussed addition of Zetia and she prefers to start with dietary changes and participating in PREP program at [facility]. Will plan to repeat FLP/CMP in 3 months. If LDL not at goal at that time, add Zetia.  ? ? ?Disposition: Follow up in 6 month(s) with Buford Dresser, MD or APP. ? ?Signed, ?Loel Dubonnet, NP ?02/06/2022, 9:06 AM ?[place] ?

## 2022-02-06 NOTE — Patient Instructions (Signed)
Medication Instructions:  ?Your Physician recommend you continue on your current medication as directed.   ? ?*If you need a refill on your cardiac medications before your next appointment, please call your pharmacy* ? ? ?Lab Work: ?Please return for Lab work in 3 months for fasting lipid Panel and CMET. You may come to the...  ? ?[facility] (3rd floor) ?[address] [place], [place] [address]  ?Open: 8am-Noon and 1pm-4:30pm  ? ?[facility] ?[address]  ? ?[employer]- Any location ? ?**no appointments needed** ? ?If you have labs (blood work) drawn today and your tests are completely normal, you will receive your results only by: ?MyChart Message (if you have MyChart) OR ?A paper copy in the mail ?If you have any lab test that is abnormal or we need to change your treatment, we will call you to review the results. ? ? ?Testing/Procedures: ?EKG today looks good!  ? ? ?Follow-Up: ?At [facility], you and your health needs are our priority.  As part of our continuing mission to provide you with exceptional heart care, we have created designated Provider Care Teams.  These Care Teams include your primary Cardiologist (physician) and Advanced Practice Providers (APPs -  Physician Assistants and Nurse Practitioners) who all work together to provide you with the care you need, when you need it. ? ?We recommend signing up for the patient portal called "MyChart".  Sign up information is provided on this After Visit Summary.  MyChart is used to connect with patients for Virtual Visits (Telemedicine).  Patients are able to view lab/test results, encounter notes, upcoming appointments, etc.  Non-urgent messages can be sent to your provider as well.   ?To learn more about what you can do with MyChart, go to [email or web address].   ? ?Your next appointment:   ?6 month(s) ? ?The format for your next appointment:   ?In Person ? ?Provider:   ?Jodelle Red,  MD or Gillian Shields, NP{ ? ?Other Instructions ?Heart Healthy Diet Recommendations: ?A low-salt diet is recommended. Meats should be grilled, baked, or boiled. Avoid fried foods. Focus on lean protein sources like fish or chicken with vegetables and fruits. The American Heart Association is a Chief Technology Officer!  American Heart Association Diet and Lifeystyle Recommendations  ? ?Exercise recommendations: ?The American Heart Association recommends 150 minutes of moderate intensity exercise weekly. ?Try 30 minutes of moderate intensity exercise 4-5 times per week. ?This could include walking, jogging, or swimming. ? ?We have referred you to the prep program!  ? ?Preventing High Cholesterol ?Cholesterol is a white, waxy substance similar to fat that the human body needs to help build cells. The liver makes all the cholesterol that a person's body needs. Having high cholesterol (hypercholesterolemia) increases your risk for heart disease and stroke. Extra or excess cholesterol comes from the food that you eat. ?High cholesterol can often be prevented with diet and lifestyle changes. If you already have high cholesterol, you can control it with diet, lifestyle changes, and medicines. ?How can high cholesterol affect me? ?If you have high cholesterol, fatty deposits (plaques) may build up on the walls of your blood vessels. The blood vessels that carry blood away from your heart are called arteries. Plaques make the arteries narrower and stiffer. This in turn can: ?Restrict or block blood flow and cause blood clots to form. ?Increase your risk for heart attack and stroke. ?What can increase my risk for high cholesterol? ?This condition is more likely  to develop in people who: ?Eat foods that are high in saturated fat or cholesterol. Saturated fat is mostly found in foods that come from animal sources. ?Are overweight. ?Are not getting enough exercise. ?Use products that contain nicotine or tobacco, such as cigarettes,  e-cigarettes, and chewing tobacco. ?Have a family history of high cholesterol (familial hypercholesterolemia). ?What actions can I take to prevent this? ?Nutrition ? ?Eat less saturated fat. ?Avoid trans fats (partially hydrogenated oils). These are often found in margarine and in some baked goods, fried foods, and snacks bought in packages. ?Avoid precooked or cured meat, such as bacon, sausages, or meat loaves. ?Avoid foods and drinks that have added sugars. ?Eat more fruits, vegetables, and whole grains. ?Choose healthy sources of protein, such as fish, poultry, lean cuts of red meat, beans, peas, lentils, and nuts. ?Choose healthy sources of fat, such as: ?Nuts. ?Vegetable oils, especially olive oil. ?Fish that have healthy fats, such as omega-3 fatty acids. These fish include mackerel or salmon. ?Lifestyle ?Lose weight if you are overweight. Maintaining a healthy body mass index (BMI) can help prevent or control high cholesterol. It can also lower your risk for diabetes and high blood pressure. Ask your health care provider to help you with a diet and exercise plan to lose weight safely. ?Do not use any products that contain nicotine or tobacco. These products include cigarettes, chewing tobacco, and vaping devices, such as e-cigarettes. If you need help quitting, ask your health care provider. ?Alcohol use ?Do not drink alcohol if: ?Your health care provider tells you not to drink. ?You are pregnant, may be pregnant, or are planning to become pregnant. ?If you drink alcohol: ?Limit how much you have to: ?0-1 drink a day for women. ?0-2 drinks a day for men. ?Know how much alcohol is in your drink. In the [place] one drink equals one 12 oz bottle of beer (355 mL), one 5 oz glass of wine (148 mL), or one 1? oz glass of hard liquor (44 mL). ?Activity ? ?Get enough exercise. Do exercises as told by your health care provider. ?Each week, do at least 150 minutes of exercise that takes a medium level of effort  (moderate-intensity exercise). This kind of exercise: ?Makes your heart beat faster while allowing you to still be able to talk. ?Can be done in short sessions several times a day or longer sessions a few times a week. For example, on 5 days each week, you could walk fast or ride your bike 3 times a day for 10 minutes each time. ?Medicines ?Your health care provider may recommend medicines to help lower cholesterol. This may be a medicine to lower the amount of cholesterol that your liver makes. You may need medicine if: ?Diet and lifestyle changes have not lowered your cholesterol enough. ?You have high cholesterol and other risk factors for heart disease or stroke. ?Take over-the-counter and prescription medicines only as told by your health care provider. ?General information ?Manage your risk factors for high cholesterol. Talk with your health care provider about all your risk factors and how to lower your risk. ?Manage other conditions that you have, such as diabetes or high blood pressure (hypertension). ?Have blood tests to check your cholesterol levels at regular points in time as told by your health care provider. ?Keep all follow-up visits. This is important. ?Where to find more information ?American Heart Association: www.heart.org ?National Heart, Lung, and Blood Institute: PopSteam.iswww.nhlbi.nih.gov ?Summary ?High cholesterol increases your risk for heart disease and stroke. By  keeping your cholesterol level low, you can reduce your risk for these conditions. ?High cholesterol can often be prevented with diet and lifestyle changes. ?Work with your health care provider to manage your risk factors, and have your blood tested regularly. ?This information is not intended to replace advice given to you by your health care provider. Make sure you discuss any questions you have with your health care provider. ?Document Revised: 11/28/2020 Document Reviewed: 11/28/2020 ?Elsevier Patient Education ? 2023 Elsevier  Inc. ? ? ?Important Information About Sugar ? ? ? ? ? ? ?

## 2022-02-09 ENCOUNTER — Telehealth: Payer: Self-pay

## 2022-02-09 NOTE — Telephone Encounter (Signed)
Called to discuss PREP program referral; she wants to attend, but is going out of the town the last week in May which is when the next class is scheduled to begin at [place]. Will call her when next class is scheduled to begin in June at [place]. ?

## 2022-02-19 ENCOUNTER — Telehealth: Payer: Self-pay | Admitting: Family Medicine

## 2022-02-19 ENCOUNTER — Telehealth: Payer: Self-pay

## 2022-02-19 DIAGNOSIS — F419 Anxiety disorder, unspecified: Secondary | ICD-10-CM

## 2022-02-19 MED ORDER — ALPRAZOLAM 0.25 MG PO TABS: ORAL_TABLET | ORAL | 0 refills | Status: DC

## 2022-02-19 NOTE — Telephone Encounter (Signed)
Pt stated she wanted to speak with Tammy about a medication she had discussed with Dr.Lowne that she never received. She stated this is xanax and she would like someone to follow up regarding this rx. Please advise.  ? ?[facility] - [place], [place] - [address]  ? [address] [place], [place] [address]  ?Phone:  [phone]  Fax:  [phone]  ?

## 2022-02-19 NOTE — Telephone Encounter (Signed)
Called to discuss schedule change for next PREP class; is able to attend class starting June 5, every M/W 12-1:115p. Will contact end of May to set up assessment visit. ?

## 2022-02-19 NOTE — Telephone Encounter (Signed)
Requesting: Xanax ?Contract: N/A ?UDS: N/A ?Last OV: 12/21/2021 ?Next OV: 04/24/2022 ?Last Refill: 06/20/2021, #60--0 RF ?Database: ? ? ?Please advise  ? ?

## 2022-02-21 ENCOUNTER — Telehealth: Payer: Self-pay | Admitting: Pharmacist

## 2022-02-21 NOTE — Telephone Encounter (Signed)
Patient appearing on report for [facility] - Hypertension Control report due to last documented ambulatory blood pressure of 155/52 on 12/21/2021. Next appointment with PCP is 04/24/2022 ? ?Outreached patient to discuss hypertension control and medication management.  ? ?Current antihypertensives: ?Amlodipine 10mg  daily ?Losartan 100mg  daily (changed 12/21/2021) ?Metoprolol Succinate 50mg  daily ? ? ? ? ? ?Assessment/Plan: ?- Currently blood pressure improved. Per home blood pressure readings. Patient will be in office tomorrow 02/22/2022 with her husband for an OV with Dr 12/23/2021 . Will check blood pressure while in office.  ? ?02/24/2022, PharmD ?Clinical Pharmacist ?[facility] ?[facility] ? ? ?

## 2022-02-22 ENCOUNTER — Ambulatory Visit (INDEPENDENT_AMBULATORY_CARE_PROVIDER_SITE_OTHER): Payer: Medicare HMO | Admitting: Pharmacist

## 2022-02-22 VITALS — BP 146/78 | HR 70

## 2022-02-22 DIAGNOSIS — I10 Essential (primary) hypertension: Secondary | ICD-10-CM

## 2022-02-22 NOTE — Patient Instructions (Signed)
[name] It was a pleasure speaking with you  Below is a summary of your health goals and care plan   If you have any questions or concerns, please feel free to contact me either at the phone number below or with a MyChart message.   Keep up the good work!  [name], PharmD Clinical Pharmacist [facility] [phone] (direct line)  [phone] (main office number)   CARE PLAN ENTRY    Hypertension BP Readings from Last 3 Encounters:  02/22/22 (!) 146/78  02/06/22 (!) 144/70  01/25/22 126/72   Pharmacist Clinical Goal(s): Over the next 90 days, patient will work with PharmD and providers to maintain BP goal <130/80 Current regimen:  Amlodipine 10mg  daily Losartan 100mg  daily Metoprolol Succinate 50mg  daily Interventions: Requested patient to check her blood pressure 2 times per week and record Patient self care activities - Over the next 90 days, patient will: Ensure daily salt intake < 2300 mg/day Continue current regimen for blood pressure Check blood pressure 2 to 3 times per week and record Take usual morning blood pressure medications prior to in office appointments in future.  Hyperlipidemia / Heart Disease Lab Results  Component Value Date/Time   LDLCALC 81 12/21/2021 10:25 AM   LDLCALC 61 06/15/2020 08:19 AM   LDLDIRECT 53.0 04/24/2021 03:42 PM   Pharmacist Clinical Goal(s): Over the next 90 days, patient will work with PharmD and providers to maintain LDL goal < 70 Current regimen:  Aspirin 81mg  daily Fenofibrate 160mg  daily Omega 3 fish Oil 1030mg  twice daily Atorvastatin 40mg  daily Interventions: Reviewed last lipid panel with patient. Has maintained LDL at goal and Tg improved form 344 in 2021 to 145 on 12/06/2020 Patient self care activities - Over the next 90 days, patient will: Continue current regimen for heart health and cholesterol Continue to follow low fat diet  Diabetes Lab Results  Component  Value Date/Time   HGBA1C 7.9 (A) 12/13/2021 10:06 AM   HGBA1C 9.0 (A) 09/13/2021 09:48 AM   HGBA1C 8.4 (H) 04/24/2021 03:42 PM   HGBA1C 8.4 (H) 02/16/2021 09:37 AM   Pharmacist Clinical Goal(s): Over the next 180 days, patient will work with PharmD and providers to achieve A1c goal <7% Current regimen:  Treiba 40 units daily Novolog - 12 units plus additional correction based on scale from Dr Jardiance 25mg  - take 1 tablet each morning Interventions: Discussed DM goals Fasting (before meal blood sugar): 80-130 Post-Prandial (2 hrs after eating blood sugar): less than 180 A1c (3 month blood sugar average): less than 7% Helped patient apply for LIS - patient has been approved brand name medications will be $10.35 and generics either $0 or $4.95. Patient self care activities - Over the next 90 days, patient will: Continue to check blood glucose prior to meals and at bedtime Contact provider with any episodes of hypoglycemia    Depression / anxiety Pharmacist Clinical Goal(s) Over the next 90 days, patient will work with PharmD and providers to reduce symptoms associated with depression Current regimen:  Alprazolam 0.25mg  three times daily as needed  Fluoxetine 60mg  daily - take 3 capsules of 20mg  to equal 60mg  daily Interventions: None today Patient self care activities - Over the next 90 days, patient will: Continue current regimen  Osteoporosis Pharmacist Clinical Goal(s) Over the next 180 days, patient will work with PharmD and providers to reduce risk of fracture due to osteoporosis  Current regimen:  Alendronate 70mg  weekly Patient self care activities -  Over the next 180 days, patient will: Consider repeat DEXA Scan  Shoulder Pain:  Pharmacist Clinical Goal(s) Over the next 180 days, patient will work with PharmD and providers to improve pain and improve BG control to A1c <8.0 so patient can be scheduled for needed shoulder surgery Current regimen:   Tramadol 50mg  up to 3 times a day if needed for pain Interventions: Discuss importance of taking diabetes meds daily and getting A1c <8.0 so surgery can be scheduled.  Patient self care activities - Over the next 180 days, patient will: Continue to follow up with orthopedic office for pain management   Medication management Pharmacist Clinical Goal(s): Over the next 90 days, patient will work with PharmD and providers to maintain optimal medication adherence Current pharmacy: [facility] switched to [facility] Interventions Comprehensive medication review performed. Utilize [facility] pharmacy for medication synchronization, packaging and delivery Patient self care activities - Over the next 90 days, patient will: Focus on medication adherence by filling and taking medications appropriately  Take medications as prescribed Report any questions or concerns to PharmD and/or provider(s)   Patient Goals/Self-Care Activities Over the next 90 days, patient will:  take medications as prescribed,  check glucose with CGM, document, and provide at future appointments, and  collaborate with provider on medication access solutions Check blood pressure 2 to 3 times per week and record Take usual morning blood pressure medications prior to in office appointments in future.  The patient verbalized understanding of instructions, educational materials, and care plan provided today and DECLINED offer to receive copy of patient instructions, educational materials, and care plan.

## 2022-02-22 NOTE — Chronic Care Management (AMB) (Signed)
Chronic Care Management Pharmacy Note  02/22/2022 Name:  [name] MRN:  [id number] DOB:  [birth year]  Summary:  Blood pressure was elevated in office today 144/70. Rechecked in opposite arm after 10 minutes and was 146/78. Patient reports she has only taken losartan so far this morning. Has not taken amlodipine or metoprolol as usual in the morning.  Recommended take all morning blood pressure medications prior to next appointment.  Check blood pressure at home 2 to 3 times per week and record.  If blood pressure remains > 140, then consider increase metoprolol to 127m daily or add hydrochlorothiazide to current regimen.  Reviewed proper blood pressure monitoring technique Recommended limit dietary sodium to < 23022mper day.  Subjective: Emily NIEDERERs an [age].o. year old female who is a primary patient of Emily HeldDO.  The CCM team was consulted for assistance with disease management and care coordination needs.    Engaged with patient face to face for  to follow up hypertension and other chronic conditions  in response to provider referral for pharmacy case management and/or care coordination services.   Consent to Services:  The patient was given information about Chronic Care Management services, agreed to services, and gave verbal consent prior to initiation of services.  Please see initial visit note for detailed documentation.   Patient Care Team: LoCarollee Nicholson ApaDO as PCP - General ChBuford DresserMD as PCP - Cardiology (Cardiology) CaAshok PallMD as Consulting Physician (Neurosurgery) HeMonna FamMD as Consulting Physician (Ophthalmology) EcCherre RobinsRPH-CPP (Pharmacist) [facility] - ChesterIbMelanie CrazierMD as Consulting Physician (Endocrinology)  Recent office visits: 09/18/2021 - Fam Med (Dr LoEtter SjogrenCheri RousFollow up hyperlipidemia. No med changes.  07/17/2021 - Fam Med (Dr LoEtter SjogrenF/U depression and anxiety. Using  alprazolam less since fluoxetine started. Changed rosuvastatin 2066mo atorvastatin 84m45mcuase patient felt rosuvastatin was causing palpitations at night. Ultrasound of lower extremities ordered - no DVT noted.   Recent consult visits: 09/13/2021 - Endo (Dr ShamKelton PillarU diabetes. A1c 9.0% increased Tresiba to 44 units daily. -Continue Jardiance 25 mg, 1 tablet in the morning -Continue Novolog 12 units with each meal and correction factor: NovoLog (BG -130/25) 08/08/2021 - GI (Dr [name] for colon cancer screening. Discussed the risks and benefits to screening via colonoscopy versus screening via Cologuard stool based test.  She decided to go ahead with Cologuard stool based test    Hospital visits: [facility]11/2021 [facility] She saw an orthopedist in August of last year who was going to refer her to Dr. NundKathyrn Nicholson neurosurgery as it was felt that this was likely radicular pain from her back.  She said that she never did follow-up with a neurosurgeon. suspect that she may have some radicular pain from her back.  I discussed options.  At this point we can prescribe her a short course of pain medication.  She has taken Vicodin before and opted for this.  I offered referral to a back specialist.  She states that she will follow-up with her PCP regarding this.  She was discharged home in good condition.  Return precautions were given. Objective:  Lab Results  Component Value Date   CREATININE 0.88 12/21/2021   CREATININE 0.75 09/18/2021   CREATININE 0.85 07/17/2021    Lab Results  Component Value Date   HGBA1C 7.9 (A) 12/13/2021   Last diabetic Eye exam:  Lab Results  Component Value Date/Time   HMDIABEYEEXA Retinopathy (A) 12/30/2020 12:00 AM    Last diabetic Foot exam: No results found for: HMDIABFOOTEX  - Done 12/29/2020 by Dr [name].     Component Value Date/Time   CHOL 137 12/21/2021 1025   TRIG 72.0 12/21/2021 1025   HDL  41.70 12/21/2021 1025   CHOLHDL 3 12/21/2021 1025   VLDL 14.4 12/21/2021 1025   LDLCALC 81 12/21/2021 1025   LDLCALC 61 06/15/2020 0819   LDLDIRECT 53.0 04/24/2021 1542       Latest Ref Rng & Units 12/21/2021   10:25 AM 09/18/2021   11:12 AM 07/17/2021    9:12 AM  Hepatic Function  Total Protein 6.0 - 8.3 g/dL 6.8   6.4   6.7    Albumin 3.5 - 5.2 g/dL 4.5   4.1   4.3    AST 0 - 37 U/L '18   15   18    ' ALT 0 - 35 U/L '18   15   14    ' Alk Phosphatase 39 - 117 U/L 36   42   38    Total Bilirubin 0.2 - 1.2 mg/dL 0.7   0.9   0.6      Lab Results  Component Value Date/Time   TSH 0.58 08/11/2018 02:34 PM   TSH 0.57 07/16/2018 09:50 AM   FREET4 0.74 08/11/2018 02:34 PM   FREET4 0.84 04/16/2013 11:19 AM       Latest Ref Rng & Units 12/21/2021   10:25 AM 12/06/2020    4:03 PM 09/23/2020    2:38 PM  CBC  WBC 4.0 - 10.5 K/uL 7.2   7.0   13.9    Hemoglobin 12.0 - 15.0 g/dL 12.9   12.2   13.5    Hematocrit 36.0 - 46.0 % 39.0   36.6   40.1    Platelets 150.0 - 400.0 K/uL 203.0   182.0   215      No results found for: VD25OH  Clinical ASCVD: Yes  The 10-year ASCVD risk score (Arnett DK, et al., 2019) is: 26.8%   Values used to calculate the score:     Age: 76 years     Sex: Female     Is Non-Hispanic African American: Yes     Diabetic: Yes     Tobacco smoker: No     Systolic Blood Pressure: 829 mmHg     Is BP treated: Yes     HDL Cholesterol: 41.7 mg/dL     Total Cholesterol: 137 mg/dL    Other: DEXA 05/29/2017 Forearm Radius 33% T-score = -2.5.  DualFemur Neck Left  T-Score = -2.0; Which was a statistically significant increase in BMD of the left hip since prior exam dated 12/17/2013.  Social History   Tobacco Use  Smoking Status Former   Packs/day: 0.20   Types: Cigarettes   Quit date: 05/05/2020   Years since quitting: 1.8  Smokeless Tobacco Never  Tobacco Comments   quit in 2021   BP Readings from Last 3 Encounters:  02/22/22 (!) 146/78  02/06/22 (!) 144/70   01/25/22 126/72   Pulse Readings from Last 3 Encounters:  02/22/22 70  02/06/22 (!) 55  12/21/21 (!) 53   Wt Readings from Last 3 Encounters:  02/06/22 176 lb 12.8 oz (80.2 kg)  12/21/21 171 lb (77.6 kg)  12/13/21 172 lb (78 kg)    Assessment: Review of patient past medical history, allergies, medications, health status, including review of  consultants reports, laboratory and other test data, was performed as part of comprehensive evaluation and provision of chronic care management services.   SDOH:  (Social Determinants of Health) assessments and interventions performed:       CCM Care Plan  Allergies  Allergen Reactions   Metformin And Related Other (See Comments)    Sick on stomach, sweaty, throw up, pain   Levemir [Insulin Detemir] Hives and Swelling   Clindamycin Nausea And Vomiting   Crestor [Rosuvastatin Calcium] Palpitations    Medications Reviewed Today     Reviewed by Loel Dubonnet, NP (Nurse Practitioner) on 02/06/22 at [place] List Status: <None>   Medication Order Taking? Sig Documenting Provider Last Dose Status Informant  Alcohol Swabs (B-D SINGLE USE SWABS REGULAR) PADS [id number] Yes USE AS DIRECTED Emily Herter, Alferd Apa, DO Taking Active   alendronate (FOSAMAX) 70 MG tablet [id number] Yes TAKE ONE TABLET BY MOUTH ONCE WEEKLY ON WEDNESDAY Ann Held, DO Taking Active   ALPRAZolam Duanne Moron) 0.25 MG tablet [id number] Yes Take 1 tablet by mouth three times a day if needed Ann Held, DO Taking Active   amLODipine (NORVASC) 10 MG tablet [id number] Yes TAKE ONE TABLET BY MOUTH ONCE DAILY Ann Held, DO Taking Active   aspirin EC 81 MG tablet [id number] Yes Take 81 mg by mouth daily. [provider] Taking Active Self  atorvastatin (LIPITOR) 40 MG tablet [id number] Yes Take 1 tablet (40 mg total) by mouth daily. Roma Schanz R, DO Taking Active   Blood Glucose Calibration (TAI [facility] CONTROL) NORMAL SOLN [id number] Yes   [provider] Taking Active Self  Blood Glucose Monitoring Suppl (TRUE METRIX AIR GLUCOSE METER) w/Device KIT [id number] Yes 1 each by Does not apply route 4 (four) times daily. DX CODE E11.8 Ann Held, DO Taking Active   Carboxymethylcellulose Sodium (THERATEARS OP) [id number] Yes Place 1 drop into both eyes daily as needed (dry eyes). [provider] Taking Active Self  COMFORT EZ PEN NEEDLES 32G X 4 MM MISC [id number] Yes USE AS DIRECTED TWICE DAILY Shamleffer, Melanie Crazier, MD Taking Active   Continuous Blood Gluc Sensor (DEXCOM G6 SENSOR) [place] [id number] Yes 1 Device by Does not apply route as directed. Shamleffer, Melanie Crazier, MD Taking Active   Continuous Blood Gluc Transmit (DEXCOM G6 TRANSMITTER) MISC [id number] Yes 1 Device by Does not apply route as directed. Shamleffer, Melanie Crazier, MD Taking Active   empagliflozin (JARDIANCE) 25 MG TABS tablet [id number] Yes Take 1 tablet (25 mg total) by mouth daily before breakfast. Shamleffer, Melanie Crazier, MD Taking Active            Med Note [place]   Tue Dec 12, 2021  2:48 PM) Approved for pap in late 2022 thru 10/07/2021  fenofibrate 160 MG tablet [id number] Yes TAKE ONE TABLET BY MOUTH EVERY EVENING Ann Held, DO Taking Active   ferrous sulfate 325 (65 FE) MG tablet [id number] Yes Take 325 mg by mouth daily with breakfast.  [provider] Taking Active Self  fexofenadine (ALLEGRA) 180 MG tablet [id number] Yes Take 180 mg by mouth daily as needed for allergies or rhinitis. [provider] Taking Active   Flaxseed, Linseed, (FLAXSEED OIL PO) [id number] Yes Take 1 capsule by mouth 2 (two) times daily.  [provider] Taking Active Self  FLUoxetine (PROZAC) 20 MG capsule [id number] Yes Take 60 mg by mouth daily at 6 (six) AM. [provider]  Taking Active   fluticasone (FLONASE) 50 MCG/ACT nasal spray [id number] Yes Place 2 sprays into both nostrils daily. Emily Herter, Kendrick Fries R, DO Taking Active   glucose blood (TRUE METRIX BLOOD GLUCOSE TEST) test strip [id number] Yes USE AS DIRECTED 4 TIMES A DAY Emily Herter, Yvonne R, DO Taking Active   insulin aspart (NOVOLOG FLEXPEN) 100 UNIT/ML FlexPen [id number] Yes Max daily 70 units Shamleffer, Melanie Crazier, MD Taking Active   insulin degludec (TRESIBA FLEXTOUCH) 100 UNIT/ML FlexTouch Pen [id number] Yes Inject 44 Units into the skin daily. Shamleffer, Melanie Crazier, MD Taking Active   Lancet Devices (LANCING DEVICE) [place] [id number] Yes  [provider] Taking Active Self  losartan (COZAAR) 100 MG tablet [id number] Yes Take 1 tablet (100 mg total) by mouth daily. Roma Schanz R, DO Taking Active   metoprolol succinate (TOPROL-XL) 50 MG 24 hr tablet [id number] Yes TAKE ONE TABLET BY MOUTH ONCE DAILY WITH FOOD Emily Herter, Alferd Apa, DO Taking Active   Multiple Vitamin (MULTIVITAMIN WITH MINERALS) TABS [id number] Yes Take 1 tablet by mouth daily. [provider] Taking Active Self  Omega-3 Fatty Acids (FISH OIL) 1000 MG CAPS [id number] Yes Take 1 capsule by mouth in the morning and at bedtime. [provider] Taking Active            Med Note Elana Alm, Kennon Rounds Oct 14, 2020  9:11 AM)    traMADol (ULTRAM) 50 MG tablet [id number] Yes Take 1 tablet (50 mg total) by mouth 2 (two) times daily as needed. Aundra Dubin, PA-C Taking Active   TRUEplus Lancets 33G MISC [id number] Yes USE AS DIRECTED 4 TIMES A DAY Ann Held, DO Taking Active   vitamin E 400 UNIT capsule [id number] Yes Take 400 Units by mouth daily. [provider] Taking Active Self            Patient Active Problem List   Diagnosis Date Noted   Primary hypertension 09/18/2021   Pain of left calf 09/18/2021   Depression with anxiety 06/21/2021   Diabetes mellitus ([place]) 01/17/2021   Type 2 diabetes mellitus with hyperglycemia, without long-term current use of insulin ([place]) 01/17/2021   Acute pain of  right shoulder 08/18/2020   Primary osteoarthritis of right knee 08/18/2020   Impacted cerumen of right ear 05/24/2020   Bilateral impacted cerumen 05/17/2020   Hyperlipidemia associated with type 2 diabetes mellitus ([place]) 03/10/2020   Cough 03/10/2020   Seasonal allergies 03/10/2020   Elevated rheumatoid factor 07/21/2018   Gastroenteritis 07/21/2018   Trigger middle finger of left hand 12/19/2017   Ganglion cyst of dorsum of right wrist 12/19/2017   Morton's neuroma of right foot 12/19/2017   Preventative health care 12/13/2017   Pain in both hands 12/13/2017   Hyperlipidemia LDL goal <100 12/13/2017   Abscess of upper gum 12/13/2017   Spinal stenosis of cervical region 09/25/2016   Cervical radiculopathy 09/06/2016   Dyspnea 02/18/2016   Chest pain 02/18/2016   HLD (hyperlipidemia) 02/18/2016   CAD in native artery 02/18/2016   RLS (restless legs syndrome) 02/18/2016   Pancreatitis 02/18/2016   SOB (shortness of breath) 02/18/2016   Uncontrolled type 2 diabetes mellitus with complication    Tobacco abuse    Osteoarthritis of right knee 07/05/2015   Smoking 07/02/2013   Acute upper respiratory infections of unspecified site 07/02/2013   Flu vaccine need 07/02/2013   Multinodular goiter 04/16/2013   Bunion 07/07/2012   BACK PAIN, THORACIC REGION, RIGHT 05/24/2009  IRON DEFICIENCY 03/25/2009   GERD 03/25/2009   WEIGHT LOSS 03/25/2009   SINUSITIS- ACUTE-NOS 11/25/2008   Hyperlipidemia LDL goal <70 10/07/2008   ACUTE PANCREATITIS 09/07/2008   OVARIAN CYST 09/07/2008   KNEE PAIN, RIGHT 05/24/2008   CARPAL TUNNEL SYNDROME, RIGHT 05/12/2008   Acute pain of both shoulders 04/02/2008   ROTATOR CUFF REPAIR, RIGHT, HX OF 03/29/2008   GOITER, MULTINODULAR 12/26/2007   Depression 07/07/2007   HIP PAIN, RIGHT 03/11/2007   RESTLESS LEG SYNDROME, HX OF 03/11/2007   Diabetes mellitus type II, uncontrolled ([place]) 03/07/2007   Essential hypertension 03/07/2007    Immunization  History  Administered Date(s) Administered   Fluad Quad(high Dose 65+) 07/28/2019, 06/20/2021   Influenza Split 08/08/2012   Influenza, High Dose Seasonal PF 08/09/2014, 07/05/2015, 07/21/2018   PFIZER(Purple Top)SARS-COV-2 Vaccination 12/07/2019, 01/04/2020   PPD Test 08/27/2011, 09/04/2011, 07/13/2014, 09/23/2015   Pneumococcal Conjugate-13 08/09/2014   Pneumococcal Polysaccharide-23 11/17/2013   Td 03/11/2007   Zoster, Live 03/11/2007    Conditions to be addressed/monitored: CAD, HTN, HLD, DMII, Anxiety, Depression and shoulder pain; OA of knee; osteoporosis; h/o pancreatitis  Care Plan : General Pharmacy (Adult)  Updates made by Cherre Robins, RPH-CPP since 02/22/2022 12:00 AM     Problem: Chronic Disease Management support, education, and care coordination needs related to HTN, Hyperlipidemia; CAD, Diabetes, Depression/Anxiety, Osteoporosis, Neuropathy   Priority: High  Onset Date: 02/23/2021  Note:   Current Barriers:  Unable to independently afford treatment regimen Unable to achieve control of type 2 DM  Does not adhere to prescribed medication regimen Chronic Disease Management support, education, and care coordination needs related to HTN, Hyperlipidemia, CAD, Diabetes, Depression/Anxiety, Osteoporosis, Neuropathy  Pharmacist Clinical Goal(s):  Over the next 90 days, patient will verbalize ability to afford treatment regimen achieve control of type 2 DM as evidenced by A1c <7.0% maintain control of HTN and lipids as evidenced by BP <130/80 (per cardio) and LDL <70  adhere to prescribed medication regimen as evidenced by patient report of taking medications daily  through collaboration with PharmD and provider.   Interventions: 1:1 collaboration with Emily Herter, Alferd Apa, DO regarding development and update of comprehensive plan of care as evidenced by provider attestation and co-signature Inter-disciplinary care team collaboration (see longitudinal plan of  care) Comprehensive medication review performed; medication list updated in electronic medical record  Hypertension Controlled; BP goal <130/80 BP Readings from Last 3 Encounters:  02/22/22 (!) 146/78  02/06/22 (!) 144/70  01/25/22 126/72  Patient checks BP at home infrequently - usually once per week.  Patient has failed these meds in the past: lisinopril 41m - ineffective Current regimen:  Amlodipine 118mdaily Losartan 10010maily (changed 12/21/2021) Metoprolol Succinate 25m87mily Interventions:  Requested patient to check her blood pressure 2 times per week and record  Reminded her to take morning blood pressure medications prior to in office appointments in future. Reviewed blood pressure goals Ensure daily salt intake < 2300 mg/day Continue current regimen for blood pressure  Hyperlipidemia / CAD Controlled; LDL goal < 70 Lab Results  Component Value Date   CHOL 137 12/21/2021   HDL 41.70 12/21/2021   LDLCALC 81 12/21/2021   LDLDIRECT 53.0 04/24/2021   TRIG 72.0 12/21/2021   CHOLHDL 3 12/21/2021   Current regimen:  Aspirin 81mg79mly Fenofibrate 160mg 19my Omega 3 fish Oil 1030mg t2m daily Atorvastatin 40mg da68m(restarted 07/17/2021) Previous statin tried: rosuvastatin 10mg dai56m caused palpitation Interventions: Continue current regimen for heart health and cholesterol Continue to  follow low fat diet Reviewed adherence for atorvastatin   Diabetes Lab Results  Component Value Date   HGBA1C 7.9 (A) 12/13/2021   HGBA1C 9.0 (A) 09/13/2021   HGBA1C 8.4 (A) 06/23/2021   A1c not at goal but has improved since she started new insuiln regimen and is using Continuous Glucose Monitor.  Initial goal is to get A1c <8.0 so patient can have shoulder surgery but eventually would like A1c goal <7.5% as long as doesn't increase hypoglycemia Followed by Dr [name]  Current regimen:  Novolog 12 units with each meal + additional correction based on blood glucose  (max of 70 units per day) Tyler Aas 44 units daily Jardiance 44m - take 1 tablet each morning Past medications - Januvia 1053mdaily and glimepiride 70m49mam (discontinue by Dr ShaKelton Pillar22); Novoloin 70/30 insulin - changed 06/2021 due to low efficacy History of pancreatitis - so no GLP1s or DPP4s Denies hypoglycemia or blood glucose <80  Received PAP for Jardiance in 2022 but she has been approved for LIS for 2023 and cost of brand medications is $10.35 per month and generic $0 or $4.95 per month.   Patient endorses she is using DexCom. Reports no blood glucose readings < 80; Occasional blood glucose > 200 when she eats high carb or sugar food. Patient was unable to tell me average blood glucose. Interventions: Continue current regimen.  Patient to bring in LIS letters she received for review. Might not need patient assistance program applications. Reviewed insulin regimen, specifically bolus insulin dosing Reviewed foods that can increase blood glucose the most - limit sugar intake and high carbohydrate foods.  Discussed DM goals Fasting (before meal blood sugar): 80-130 Post-Prandial (2 hrs after eating blood sugar): less than 180 A1c (3 month blood sugar average): less than 7.5%  Depression / anxiety Patient reports anxiety and depression has improved some with increase dose of fluoxetine earlier this month Patient has failed these meds in past: sertraline (palpitations) Current regimen:  Alprazolam 0.67m60mree times daily as needed  Fluoxetine 60mg41mly Interventions:  Continue current medications  Osteoporosis  Last DEXA Scan: 05/29/2017             T-Score femoral neck: -2.0             T-Score forearm radius: -2.5 Current regimen:  Alendronate 70mg 60mly Interventions: (addressed at previous visit) Consider repeat DEXA Scan Continue current regimen  Shoulder Pain / osteoarthritis of right knee:  Surgery is planned once A1c is < 8.0% Current regimen:  Tramadol 50mg  64mo 3 times a day if needed for pain Interventions: Discuss importance of taking diabetes meds daily and getting A1c <8.0 so surgery can be scheduled.  Continue to follow up with orthopedic office for pain management   Medication management Pharmacist Clinical Goal(s): Over the next 90 days, patient will work with PharmD and providers to maintain optimal medication adherence Current pharmacy: [facility] Interventions Comprehensive medication review performed. Continue with pharmacy medication synchronization, packaging and delivery  Coordinated with pharmacy regarding upcoming adherence packaging deliver.  Sent refill for fluticasone nasal spray  Patient Goals/Self-Care Activities Over the next 90 days, patient will:  take medications as prescribed,  check glucose with CGM, document, and provide at future appointments, and  collaborate with provider on medication access solutions Check blood pressure 2 to 3 times per week and record Take usual morning blood pressure medications prior to in office appointments in future.  Follow Up Plan: Telephone follow up appointment with care  management team member scheduled for:  3 to 4 weeks  with clinical pharmacist              Medication Assistance:  Applied for LIS for 2023 - patient was approved.     Patient's preferred pharmacy is:  [facility] - [place], [place] - [address] [place] [address] Phone: [phone] Fax: [place], [place] - [place] AT [place] [address] Phone: [phone] Fax: [place], [place] AT [place] [address] Phone: [phone] Fax: [phone]  [place] [address] [place] [address] Phone: [phone] Fax: [phone]  [id number] [address] [place]Phone: [phone]: [phone] [facility] (New Address) [place] [address] [address]Phone: [phone]: [phone]   Follow Up:  Patient agrees to Care Plan and Follow-up.  Plan: Telephone follow up appointment with care management team member scheduled for:  4 to 6 weeks    TCherre Robins PharmD Clinical Pharmacist [facility]

## 2022-02-28 ENCOUNTER — Telehealth: Payer: Self-pay

## 2022-02-28 NOTE — Telephone Encounter (Signed)
Called to confirm participation in PREP and set up assessment visit; mailbox full, sent text message asking for return call.

## 2022-03-02 ENCOUNTER — Telehealth: Payer: Self-pay

## 2022-03-02 NOTE — Telephone Encounter (Signed)
Called to confirm PREP participation for June 5, assessment visit scheduled for Tuesday June 30 at 2:30

## 2022-03-06 NOTE — Progress Notes (Signed)
[facility] PREP Evaluation  Patient Details  Name: [name] MRN: [id number] Date of Birth: Jul 15, 1946 Age: 76 y.o. PCP: [name], [name], DO  Vitals:   03/06/22 1506  BP: (!) 124/52  Pulse: 60  SpO2: 95%  Weight: 176 lb 12.8 oz (80.2 kg)     [facility] Eval - 03/06/22 1500       [facility] "PREP" Location   [facility]" Location [facility]      Referral    Referring Provider C. Walker    Reason for referral Hypertension;Diabetes    Program Start Date 03/12/22      Measurement   Waist Circumference 44 inches    Hip Circumference 42.5 inches    Body fat 40 percent      Information for Trainer   Goals --   Reduce A1c to 7.5 or less by end of program; lost 10 pounds by end of program; establish cardio and strength training program   Current Exercise none    Pertinent Medical History --   HTN, diabetes   Medications that affect exercise Beta blocker      Timed Up and Go (TUGS)   Timed Up and Go Low risk <9 seconds      Mobility and Daily Activities   I find it easy to walk up or down two or more flights of stairs. 1    I have no trouble taking out the trash. 1    I do housework such as vacuuming and dusting on my own without difficulty. 4    I can easily lift a gallon of milk (8lbs). 4    I can easily walk a mile. 1    I have no trouble reaching into high cupboards or reaching down to pick up something from the floor. 1    I do not have trouble doing out-door work such as Loss adjuster, chartered, raking leaves, or gardening. 4      Mobility and Daily Activities   I feel younger than my age. 1    I feel independent. 2    I feel energetic. 1    I live an active life.  2    I feel strong. 1    I feel healthy. 1    I feel active as other people my age. 2      How fit and strong are you.   Fit and Strong Total Score 26            Past Medical History:  Diagnosis Date   Blockage of coronary artery of heart (HCC)    Depression    Diabetes mellitus     Hyperlipidemia    Hypertension    Pancreatitis    Restless leg syndrome    Past Surgical History:  Procedure Laterality Date   CATARACT EXTRACTION, BILATERAL     CORONARY ANGIOPLASTY WITH STENT PLACEMENT     FOOT SURGERY Right    cyst removal   ROTATOR CUFF REPAIR     right   ROTATOR CUFF REPAIR Left    TUBAL LIGATION     Social History   Tobacco Use  Smoking Status Former   Packs/day: 0.20   Types: Cigarettes   Quit date: 05/05/2020   Years since quitting: 1.8  Smokeless Tobacco Never  Tobacco Comments   quit in 2021  To begin PREP program at [facility] June 5, every M/W 12-1:15  Jakalyn Kratky B Kyana Aicher 03/06/2022, 3:11 PM

## 2022-03-07 DIAGNOSIS — I2510 Atherosclerotic heart disease of native coronary artery without angina pectoris: Secondary | ICD-10-CM

## 2022-03-07 DIAGNOSIS — Z794 Long term (current) use of insulin: Secondary | ICD-10-CM

## 2022-03-07 DIAGNOSIS — E785 Hyperlipidemia, unspecified: Secondary | ICD-10-CM

## 2022-03-07 DIAGNOSIS — E1159 Type 2 diabetes mellitus with other circulatory complications: Secondary | ICD-10-CM

## 2022-03-07 DIAGNOSIS — I10 Essential (primary) hypertension: Secondary | ICD-10-CM | POA: Diagnosis not present

## 2022-03-07 DIAGNOSIS — Z87891 Personal history of nicotine dependence: Secondary | ICD-10-CM | POA: Diagnosis not present

## 2022-03-07 DIAGNOSIS — Z7984 Long term (current) use of oral hypoglycemic drugs: Secondary | ICD-10-CM | POA: Diagnosis not present

## 2022-03-07 DIAGNOSIS — M8580 Other specified disorders of bone density and structure, unspecified site: Secondary | ICD-10-CM

## 2022-03-08 ENCOUNTER — Ambulatory Visit (INDEPENDENT_AMBULATORY_CARE_PROVIDER_SITE_OTHER): Payer: Medicare HMO | Admitting: Pharmacist

## 2022-03-08 DIAGNOSIS — E785 Hyperlipidemia, unspecified: Secondary | ICD-10-CM

## 2022-03-08 DIAGNOSIS — E1165 Type 2 diabetes mellitus with hyperglycemia: Secondary | ICD-10-CM

## 2022-03-08 DIAGNOSIS — I10 Essential (primary) hypertension: Secondary | ICD-10-CM

## 2022-03-08 NOTE — Patient Instructions (Signed)
[name] It was a pleasure speaking with you  Below is a summary of your health goals and care plan  Patient Goals/Self-Care Activities take medications as prescribed,  check glucose with Continuous Glucose Monitor, document, and provide at future appointments, and  collaborate with provider on medication access solutions Check blood pressure 2 to 3 times per week and record Take usual morning blood pressure medications prior to in office appointments in future. See below for information regarding exercise and blood glucose.   If you have any questions or concerns, please feel free to contact me either at the phone number below or with a MyChart message.   Keep up the good work!  [name], PharmD Clinical Pharmacist [facility] [phone] (direct line)  [phone] (main office number)   Diabetes Mellitus and Exercise Exercising regularly is important for overall health, especially for people who have diabetes mellitus. Exercising is not only about losing weight. It has many other health benefits, such as increasing muscle strength and bone density and reducing body fat and stress. This leads to improved fitness, flexibility, and endurance, all of which result in better overall health. What are the benefits of exercise if I have diabetes? Exercise has many benefits for people with diabetes. They include: Helping to lower and control blood sugar (glucose). Helping the body to respond better to the hormone insulin by improving insulin sensitivity. Reducing how much insulin the body needs. Lowering the risk for heart disease by: Lowering "bad" cholesterol and triglyceride levels. Increasing "good" cholesterol levels. Lowering blood pressure. Lowering blood glucose levels. What is my activity plan? Your health care provider or certified diabetes educator can help you make a plan for the type and frequency of exercise that works for you.  This is called your activity plan. Be sure to: Get at least 150 minutes of medium-intensity or high-intensity exercise each week. Exercises may include brisk walking, biking, or water aerobics. Do stretching and strengthening exercises, such as yoga or weight lifting, at least 2 times a week. Spread out your activity over at least 3 days of the week. Get some form of physical activity each day. Do not go more than 2 days in a row without some kind of physical activity. Avoid being inactive for more than 90 minutes at a time. Take frequent breaks to walk or stretch. Choose exercises or activities that you enjoy. Set realistic goals. Start slowly and gradually increase your exercise intensity over time. How do I manage my diabetes during exercise?  Monitor your blood glucose Check your blood glucose before and after exercising. If your blood glucose is: 240 mg/dL (93.7 mmol/L) or higher before you exercise, check your urine for ketones. These are chemicals created by the liver. If you have ketones in your urine, do not exercise until your blood glucose returns to normal. 100 mg/dL (5.6 mmol/L) or lower, eat a snack containing 15-20 grams of carbohydrate. Check your blood glucose 15 minutes after the snack to make sure that your glucose level is above 100 mg/dL (5.6 mmol/L) before you start your exercise. Know the symptoms of low blood glucose (hypoglycemia) and how to treat it. Your risk for hypoglycemia increases during and after exercise. Follow these tips and your health care provider's instructions Keep a carbohydrate snack that is fast-acting for use before, during, and after exercise to help prevent or treat hypoglycemia. Avoid injecting insulin into areas of the body that are going to be exercised. For example, avoid injecting  insulin into: Your arms, when you are about to play tennis. Your legs, when you are about to go jogging. Keep records of your exercise habits. Doing this can help you  and your health care provider adjust your diabetes management plan as needed. Write down: Food that you eat before and after you exercise. Blood glucose levels before and after you exercise. The type and amount of exercise you have done. Work with your health care provider when you start a new exercise or activity. He or she may need to: Make sure that the activity is safe for you. Adjust your insulin, other medicines, and food that you eat. Drink plenty of water while you exercise. This prevents loss of water (dehydration) and problems caused by a lot of heat in the body (heat stroke). Where to find more information American Diabetes Association: www.diabetes.org Summary Exercising regularly is important for overall health, especially for people who have diabetes mellitus. Exercising has many health benefits. It increases muscle strength and bone density and reduces body fat and stress. It also lowers and controls blood glucose. Your health care provider or certified diabetes educator can help you make an activity plan for the type and frequency of exercise that works for you. Work with your health care provider to make sure any new activity is safe for you. Also work with your health care provider to adjust your insulin, other medicines, and the food you eat. This information is not intended to replace advice given to you by your health care provider. Make sure you discuss any questions you have with your health care provider. Document Revised: 06/22/2019 Document Reviewed: 06/22/2019 Elsevier Patient Education  2023 [employer].   The patient verbalized understanding of instructions, educational materials, and care plan provided today and agreed to receive a mailed copy of patient instructions, educational materials, and care plan.

## 2022-03-08 NOTE — Chronic Care Management (AMB) (Signed)
Chronic Care Management Pharmacy Note  03/08/2022 Name:  [name] MRN:  [id number] DOB:  1946/08/04  Summary:  Blood pressure checked at PREP program screen was 124/52 - was at goal. Patient states she had taken her blood pressure medications that morning. She will start PREP exercise program 03/2022.  Patient denes blood glucose < 80 or > 220. She is using Continuous Glucose Monitor regularly to monitor blood glucose and endorses adherence to diabetes medications.    Recommended continue current hypertension therapy Check blood pressure at home 2 to 3 times per week and record.  Recommended limit dietary sodium to < 2344m per day. [facility]  Discussed that she will need to monitor blood glucose with increase exercise. Reviewed using DexCom 6 system (arrows and colors) to help her know when blood glucose might be decreasing quickly and steps to take to prevent lows.   Subjective: STIEN AISPUROis an 76y.o. year old female who is a primary patient of LAnn Held DO.  The CCM team was consulted for assistance with disease management and care coordination needs.    Engaged with patient face to face for  to follow up hypertension and other chronic conditions  in response to provider referral for pharmacy case management and/or care coordination services.   Consent to Services:  The patient was given information about Chronic Care Management services, agreed to services, and gave verbal consent prior to initiation of services.  Please see initial visit note for detailed documentation.   Patient Care Team: LCarollee Herter YAlferd Apa DO as PCP - General CBuford Dresser MD as PCP - Cardiology (Cardiology) CAshok Pall MD as Consulting Physician (Neurosurgery) HMonna Fam MD as Consulting Physician (Ophthalmology) ECherre Robins RPH-CPP (Pharmacist) [facility] IMelanie Crazier MD as Consulting  Physician (Endocrinology)  Recent office visits: 09/18/2021 - [facility] (Dr LEtter SjogrenCheri Rous Follow up hyperlipidemia. No med changes.  07/17/2021 - Fam Med (Dr LEtter Sjogren F/U depression and anxiety. Using alprazolam less since fluoxetine started. Changed rosuvastatin 253mto atorvastatin 4058mecuase patient felt rosuvastatin was causing palpitations at night. Ultrasound of lower extremities ordered - no DVT noted.   Recent consult visits: 09/13/2021 - Endo (Dr ShaKelton Pillar/U diabetes. A1c 9.0% increased Tresiba to 44 units daily. -Continue Jardiance 25 mg, 1 tablet in the morning -Continue Novolog 12 units with each meal and correction factor: NovoLog (BG -130/25) 08/08/2021 - GI (Dr JacArdis Hughseen for colon cancer screening. Discussed the risks and benefits to screening via colonoscopy versus screening via Cologuard stool based test.  She decided to go ahead with Cologuard stool based test    H[facility] visits: [facility] She saw an orthopedist in August of last year who was going to refer her to Dr. NunKathyrn Sheriffth neurosurgery as it was felt that this was likely radicular pain from her back.  She said that she never did follow-up with a neurosurgeon. suspect that she may have some radicular pain from her back.  I discussed options.  At this point we can prescribe her a short course of pain medication.  She has taken Vicodin before and opted for this.  I offered referral to a back specialist.  She states that she will follow-up with her PCP regarding this.  She was discharged home in good condition.  Return precautions were given. Objective:  Lab Results  Component Value Date  CREATININE 0.88 12/21/2021   CREATININE 0.75 09/18/2021   CREATININE 0.85 07/17/2021    Lab Results  Component Value Date   HGBA1C 7.9 (A) 12/13/2021   Last diabetic Eye exam:  Lab Results  Component Value Date/Time   HMDIABEYEEXA Retinopathy (A) 12/30/2020 12:00 AM     Last diabetic Foot exam: No results found for: HMDIABFOOTEX  - Done 12/29/2020 by Dr Kelton Pillar.     Component Value Date/Time   CHOL 137 12/21/2021 1025   TRIG 72.0 12/21/2021 1025   HDL 41.70 12/21/2021 1025   CHOLHDL 3 12/21/2021 1025   VLDL 14.4 12/21/2021 1025   LDLCALC 81 12/21/2021 1025   LDLCALC 61 06/15/2020 0819   LDLDIRECT 53.0 04/24/2021 1542       Latest Ref Rng & Units 12/21/2021   10:25 AM 09/18/2021   11:12 AM 07/17/2021    9:12 AM  Hepatic Function  Total Protein 6.0 - 8.3 g/dL 6.8   6.4   6.7    Albumin 3.5 - 5.2 g/dL 4.5   4.1   4.3    AST 0 - 37 U/L '18   15   18    ' ALT 0 - 35 U/L '18   15   14    ' Alk Phosphatase 39 - 117 U/L 36   42   38    Total Bilirubin 0.2 - 1.2 mg/dL 0.7   0.9   0.6      Lab Results  Component Value Date/Time   TSH 0.58 08/11/2018 02:34 PM   TSH 0.57 07/16/2018 09:50 AM   FREET4 0.74 08/11/2018 02:34 PM   FREET4 0.84 04/16/2013 11:19 AM       Latest Ref Rng & Units 12/21/2021   10:25 AM 12/06/2020    4:03 PM 09/23/2020    2:38 PM  CBC  WBC 4.0 - 10.5 K/uL 7.2   7.0   13.9    Hemoglobin 12.0 - 15.0 g/dL 12.9   12.2   13.5    Hematocrit 36.0 - 46.0 % 39.0   36.6   40.1    Platelets 150.0 - 400.0 K/uL 203.0   182.0   215      No results found for: VD25OH  Clinical ASCVD: Yes  The 10-year ASCVD risk score (Arnett DK, et al., 2019) is: 21.9%   Values used to calculate the score:     Age: 76 years     Sex: Female     Is Non-Hispanic African American: Yes     Diabetic: Yes     Tobacco smoker: No     Systolic Blood Pressure: 711 mmHg     Is BP treated: Yes     HDL Cholesterol: 41.7 mg/dL     Total Cholesterol: 137 mg/dL    Other: DEXA 05/29/2017 Forearm Radius 33% T-score = -2.5.  DualFemur Neck Left  T-Score = -2.0; Which was a statistically significant increase in BMD of the left hip since prior exam dated 12/17/2013.  Social History   Tobacco Use  Smoking Status Former   Packs/day: 0.20   Types: Cigarettes    Quit date: 05/05/2020   Years since quitting: 1.8  Smokeless Tobacco Never  Tobacco Comments   quit in 2021   BP Readings from Last 3 Encounters:  03/06/22 (!) 124/52  02/22/22 (!) 146/78  02/06/22 (!) 144/70   Pulse Readings from Last 3 Encounters:  03/06/22 60  02/22/22 70  02/06/22 (!) 55   Wt Readings from Last 3  Encounters:  03/06/22 176 lb 12.8 oz (80.2 kg)  02/06/22 176 lb 12.8 oz (80.2 kg)  12/21/21 171 lb (77.6 kg)    Assessment: Review of patient past medical history, allergies, medications, health status, including review of consultants reports, laboratory and other test data, was performed as part of comprehensive evaluation and provision of chronic care management services.   SDOH:  (Social Determinants of Health) assessments and interventions performed:  SDOH Interventions    Flowsheet Row Most Recent Value  SDOH Interventions   Food Insecurity Interventions Intervention Not Indicated  Physical Activity Interventions PREP Program  [patient to start PREP program 03/2022]          CCM Care Plan  Allergies  Allergen Reactions   Metformin And Related Other (See Comments)    Sick on stomach, sweaty, throw up, pain   Levemir [Insulin Detemir] Hives and Swelling   Clindamycin Nausea And Vomiting   Crestor [Rosuvastatin Calcium] Palpitations    Medications Reviewed Today     Reviewed by Cherre Robins, RPH-CPP (Pharmacist) on 03/08/22 at 1018  Med List Status: <None>   Medication Order Taking? Sig Documenting Provider Last Dose Status Informant  Alcohol Swabs (B-D SINGLE USE SWABS REGULAR) PADS [id number] Yes USE AS DIRECTED Carollee Herter, Alferd Apa, DO Taking Active   alendronate (FOSAMAX) 70 MG tablet [id number] Yes TAKE ONE TABLET BY MOUTH ONCE WEEKLY ON WEDNESDAY Ann Held, DO Taking Active   ALPRAZolam Duanne Moron) 0.25 MG tablet [id number] Yes Take 1 tablet by mouth three times a day if needed Ann Held, DO Taking Active   amLODipine  (NORVASC) 10 MG tablet [id number] Yes TAKE ONE TABLET BY MOUTH ONCE DAILY Ann Held, DO Taking Active   aspirin EC 81 MG tablet [id number] Yes Take 81 mg by mouth daily. [provider] Taking Active Self  atorvastatin (LIPITOR) 40 MG tablet [id number] Yes Take 1 tablet (40 mg total) by mouth daily. Roma Schanz R, DO Taking Active   Blood Glucose Calibration ([facility] CONTROL) NORMAL SOLN [id number] Yes  [provider] Taking Active Self  Blood Glucose Monitoring Suppl (TRUE METRIX AIR GLUCOSE METER) w/Device KIT [id number] Yes 1 each by Does not apply route 4 (four) times daily. DX CODE E11.8 Ann Held, DO Taking Active   Carboxymethylcellulose Sodium (THERATEARS OP) [id number] Yes Place 1 drop into both eyes daily as needed (dry eyes). [provider] Taking Active Self  COMFORT EZ PEN NEEDLES 32G X 4 MM MISC [id number] Yes USE AS DIRECTED TWICE DAILY Shamleffer, Melanie Crazier, MD Taking Active   Continuous Blood Gluc Sensor (DEXCOM G6 SENSOR) [place] [id number] Yes 1 Device by Does not apply route as directed. Shamleffer, Melanie Crazier, MD Taking Active   Continuous Blood Gluc Transmit (DEXCOM G6 TRANSMITTER) MISC [id number] Yes 1 Device by Does not apply route as directed. Shamleffer, Melanie Crazier, MD Taking Active   empagliflozin (JARDIANCE) 25 MG TABS tablet [id number] Yes Take 1 tablet (25 mg total) by mouth daily before breakfast. Shamleffer, Melanie Crazier, MD Taking Active            Med Note [place]   Tue Dec 12, 2021  2:48 PM) Approved for pap in late 2022 thru 10/07/2021  fenofibrate 160 MG tablet [id number] Yes TAKE ONE TABLET BY MOUTH EVERY EVENING Ann Held, DO Taking Active   ferrous sulfate 325 (65 FE) MG tablet [id number] Yes Take 325 mg by mouth daily with breakfast.  [provider] Taking Active Self  fexofenadine (ALLEGRA) 180 MG tablet [id number] Yes Take 180 mg by mouth daily as needed for allergies  or rhinitis. [provider] Taking Active   Flaxseed, Linseed, (FLAXSEED OIL PO) [id number] Yes Take 1 capsule by mouth 2 (two) times daily.  [provider] Taking Active Self  FLUoxetine (PROZAC) 20 MG capsule [id number] Yes Take 60 mg by mouth daily at 6 (six) AM. [provider] Taking Active   fluticasone (FLONASE) 50 MCG/ACT nasal spray [id number] Yes Place 2 sprays into both nostrils daily. Carollee Herter, Kendrick Fries R, DO Taking Active   glucose blood (TRUE METRIX BLOOD GLUCOSE TEST) test strip [id number] Yes USE AS DIRECTED 4 TIMES A DAY Carollee Herter, Yvonne R, DO Taking Active   insulin aspart (NOVOLOG FLEXPEN) 100 UNIT/ML FlexPen [id number] Yes Max daily 70 units Shamleffer, Melanie Crazier, MD Taking Active   insulin degludec (TRESIBA FLEXTOUCH) 100 UNIT/ML FlexTouch Pen [id number] Yes Inject 44 Units into the skin daily. Shamleffer, Melanie Crazier, MD Taking Active   Lancet Devices (LANCING DEVICE) [place] [id number] Yes  [provider] Taking Active Self  losartan (COZAAR) 100 MG tablet [id number] Yes Take 1 tablet (100 mg total) by mouth daily. Roma Schanz R, DO Taking Active   metoprolol succinate (TOPROL-XL) 50 MG 24 hr tablet [id number] Yes TAKE ONE TABLET BY MOUTH ONCE DAILY WITH FOOD Carollee Herter, Alferd Apa, DO Taking Active   Multiple Vitamin (MULTIVITAMIN WITH MINERALS) TABS [id number] Yes Take 1 tablet by mouth daily. [provider] Taking Active Self  Omega-3 Fatty Acids (FISH OIL) 1000 MG CAPS [id number] Yes Take 1 capsule by mouth in the morning and at bedtime. [provider] Taking Active            Med Note Elana Alm, Kennon Rounds Oct 14, 2020  9:11 AM)    traMADol (ULTRAM) 50 MG tablet [id number] Yes Take 1 tablet (50 mg total) by mouth 2 (two) times daily as needed. Aundra Dubin, PA-C Taking Active   TRUEplus Lancets 33G MISC [id number] Yes USE AS DIRECTED 4 TIMES A DAY Ann Held, DO Taking Active   vitamin E 400 UNIT  capsule [id number] Yes Take 400 Units by mouth daily. [provider] Taking Active Self            Patient Active Problem List   Diagnosis Date Noted   Primary hypertension 09/18/2021   Pain of left calf 09/18/2021   Depression with anxiety 06/21/2021   Diabetes mellitus ([place]) 01/17/2021   Type 2 diabetes mellitus with hyperglycemia, without long-term current use of insulin ([place]) 01/17/2021   Acute pain of right shoulder 08/18/2020   Primary osteoarthritis of right knee 08/18/2020   Impacted cerumen of right ear 05/24/2020   Bilateral impacted cerumen 05/17/2020   Hyperlipidemia associated with type 2 diabetes mellitus ([place]) 03/10/2020   Cough 03/10/2020   Seasonal allergies 03/10/2020   Elevated rheumatoid factor 07/21/2018   Gastroenteritis 07/21/2018   Trigger middle finger of left hand 12/19/2017   Ganglion cyst of dorsum of right wrist 12/19/2017   Morton's neuroma of right foot 12/19/2017   Preventative health care 12/13/2017   Pain in both hands 12/13/2017   Hyperlipidemia LDL goal <100 12/13/2017   Abscess of upper gum 12/13/2017   Spinal stenosis of cervical region 09/25/2016   Cervical radiculopathy 09/06/2016   Dyspnea 02/18/2016   Chest pain 02/18/2016   HLD (hyperlipidemia) 02/18/2016   CAD in native artery 02/18/2016  RLS (restless legs syndrome) 02/18/2016   Pancreatitis 02/18/2016   SOB (shortness of breath) 02/18/2016   Uncontrolled type 2 diabetes mellitus with complication    Tobacco abuse    Osteoarthritis of right knee 07/05/2015   Smoking 07/02/2013   Acute upper respiratory infections of unspecified site 07/02/2013   Flu vaccine need 07/02/2013   Multinodular goiter 04/16/2013   Bunion 07/07/2012   BACK PAIN, THORACIC REGION, RIGHT 05/24/2009   IRON DEFICIENCY 03/25/2009   GERD 03/25/2009   WEIGHT LOSS 03/25/2009   SINUSITIS- ACUTE-NOS 11/25/2008   Hyperlipidemia LDL goal <70 10/07/2008   ACUTE PANCREATITIS 09/07/2008   OVARIAN  CYST 09/07/2008   KNEE PAIN, RIGHT 05/24/2008   CARPAL TUNNEL SYNDROME, RIGHT 05/12/2008   Acute pain of both shoulders 04/02/2008   ROTATOR CUFF REPAIR, RIGHT, HX OF 03/29/2008   GOITER, MULTINODULAR 12/26/2007   Depression 07/07/2007   HIP PAIN, RIGHT 03/11/2007   RESTLESS LEG SYNDROME, HX OF 03/11/2007   Diabetes mellitus type II, uncontrolled ([place]) 03/07/2007   Essential hypertension 03/07/2007    Immunization History  Administered Date(s) Administered   Fluad Quad(high Dose 65+) 07/28/2019, 06/20/2021   Influenza Split 08/08/2012   Influenza, High Dose Seasonal PF 08/09/2014, 07/05/2015, 07/21/2018   PFIZER(Purple Top)SARS-COV-2 Vaccination 12/07/2019, 01/04/2020   PPD Test 08/27/2011, 09/04/2011, 07/13/2014, 09/23/2015   Pneumococcal Conjugate-13 08/09/2014   Pneumococcal Polysaccharide-23 11/17/2013   Td 03/11/2007   Zoster, Live 03/11/2007    Conditions to be addressed/monitored: CAD, HTN, HLD, DMII, Anxiety, Depression and shoulder pain; OA of knee; osteoporosis; h/o pancreatitis  Care Plan : General Pharmacy (Adult)  Updates made by Cherre Robins, RPH-CPP since 03/08/2022 12:00 AM     Problem: Chronic Disease Management support, education, and care coordination needs related to HTN, Hyperlipidemia; CAD, Diabetes, Depression/Anxiety, Osteoporosis, Neuropathy   Priority: High  Onset Date: 02/23/2021  Note:   Current Barriers:  Unable to independently afford treatment regimen Unable to achieve control of type 2 DM  Does not adhere to prescribed medication regimen Chronic Disease Management support, education, and care coordination needs related to HTN, Hyperlipidemia, CAD, Diabetes, Depression/Anxiety, Osteoporosis, Neuropathy  Pharmacist Clinical Goal(s):  Over the next 90 days, patient will verbalize ability to afford treatment regimen achieve control of type 2 DM as evidenced by A1c <7.0% maintain control of HTN and lipids as evidenced by BP <130/80 (per cardio)  and LDL <70  adhere to prescribed medication regimen as evidenced by patient report of taking medications daily  through collaboration with PharmD and provider.   Interventions: 1:1 collaboration with Carollee Herter, Alferd Apa, DO regarding development and update of comprehensive plan of care as evidenced by provider attestation and co-signature Inter-disciplinary care team collaboration (see longitudinal plan of care) Comprehensive medication review performed; medication list updated in electronic medical record  Hypertension improving; BP goal <130/80 BP Readings from Last 3 Encounters:  03/06/22 (!) 124/52  02/22/22 (!) 146/78  02/06/22 (!) 144/70  Patient checks BP at home infrequently - usually once per week.  Patient has failed these meds in the past: lisinopril 30m - ineffective Current regimen:  Amlodipine 134mdaily Losartan 10062maily (changed 12/21/2021) Metoprolol Succinate 39m69mily Interventions:  Requested patient to check her blood pressure 2 times per week and record  Reminded her to take morning blood pressure medications prior to in office appointments in future. Reviewed blood pressure goals Ensure daily salt intake < 2300 mg/day Continue current regimen for blood pressure  Hyperlipidemia / CAD Controlled; LDL goal < 70 Lab  Results  Component Value Date   CHOL 137 12/21/2021   HDL 41.70 12/21/2021   LDLCALC 81 12/21/2021   LDLDIRECT 53.0 04/24/2021   TRIG 72.0 12/21/2021   CHOLHDL 3 12/21/2021   Current regimen:  Aspirin 54m daily Fenofibrate 1636mdaily Omega 3 fish Oil 103019mwice daily Atorvastatin 40m41mily (restarted 07/17/2021) Previous statin tried: rosuvastatin 10mg26mly - caused palpitation Interventions: Continue current regimen for heart health and cholesterol Continue to follow low fat diet Reviewed adherence for atorvastatin   Diabetes Lab Results  Component Value Date   HGBA1C 7.9 (A) 12/13/2021   HGBA1C 9.0 (A) 09/13/2021    HGBA1C 8.4 (A) 06/23/2021   A1c not at goal but has improved since she started new insuiln regimen and is using Continuous Glucose Monitor.  Initial goal is to get A1c <8.0 so patient can have shoulder surgery but eventually would like A1c goal <7.5% as long as doesn't increase hypoglycemia Followed by Dr ShamlKelton Pillarrent regimen:  Novolog 12 units with each meal + additional correction based on blood glucose (max of 70 units per day) TresiTyler Aasnits daily Jardiance 25mg 7mke 1 tablet each morning Past medications - Januvia 100mg d37m and glimepiride 2mg qam34miscontinue by Dr ShamleffKelton PillarNovoloin 70/30 insulin - changed 06/2021 due to low efficacy History of pancreatitis - so no GLP1s or DPP4s Denies hypoglycemia or blood glucose <80  Received PAP for Jardiance in 2022 but she has been approved for LIS for 2023 and cost of brand medications is $10.35 per month and generic $0 or $4.95 per month.   Patient endorses she is using DexCom. Reports no blood glucose readings < 80; Occasional blood glucose > 200 when she eats high carb or sugar food. Patient was unable to tell me average blood glucose. Interventions: Continue current regimen.  Patient to bring in LIS letters she received for review. Might not need patient assistance program applications. Reviewed insulin regimen, specifically bolus insulin dosing Reviewed foods that can increase blood glucose the most - limit sugar intake and high carbohydrate foods.  Discussed DM goals Fasting (before meal blood sugar): 80-130 Post-Prandial (2 hrs after eating blood sugar): less than 180 A1c (3 month blood sugar average): less than 7.5%  Depression / anxiety Patient reports anxiety and depression has improved some with increase dose of fluoxetine earlier this month Patient has failed these meds in past: sertraline (palpitations) Current regimen:  Alprazolam 0.25mg thr74mimes daily as needed  Fluoxetine 60mg dail53mterventions:   Continue current medications  Osteoporosis  Last DEXA Scan: 05/29/2017             T-Score femoral neck: -2.0             T-Score forearm radius: -2.5 Current regimen:  Alendronate 70mg weekl20mterventions: (addressed at previous visit) Consider repeat DEXA Scan Continue current regimen  Shoulder Pain / osteoarthritis of right knee:  Surgery is planned once A1c is < 8.0% Current regimen:  Tramadol 50mg up to 56mmes a day if needed for pain Interventions: Discuss importance of taking diabetes meds daily and getting A1c <8.0 so surgery can be scheduled.  Continue to follow up with orthopedic office for pain management   Medication management Pharmacist Clinical Goal(s): Over the next 90 days, patient will work with PharmD and providers to maintain optimal medication adherence Current pharmacy: [facility] Interventions Comprehensive medication review performed. Continue with pharmacy medication synchronization, packaging and delivery  Coordinated with pharmacy regarding upcoming adherence packaging  deliver.  Sent refill for fluticasone nasal spray  Patient Goals/Self-Care Activities Over the next 90 days, patient will:  take medications as prescribed,  check glucose with CGM, document, and provide at future appointments, and  collaborate with provider on medication access solutions Check blood pressure 2 to 3 times per week and record Take usual morning blood pressure medications prior to in office appointments in future.  Follow Up Plan: Telephone follow up appointment with care management team member scheduled for:  4 to 6 weeks  with clinical pharmacist               Medication Assistance:  Applied for LIS for 2023 - patient was approved.     Patient's preferred pharmacy is:  [facility] - [place], [place] - [address] [place] [address] Phone: [phone] Fax: [place], [place] [address] [place] [address] Phone: [phone] Fax: [place], [place] AT [place] [address] Phone: [phone] Fax: [phone]  [place] [address] [place] [address] Phone: [phone] Fax: [phone]  [id number] [id number]: [phone]: [phone] [facility] (New Address) - [place] [place]Phone: [phone]: [phone]   Follow Up:  Patient agrees to Care Plan and Follow-up.  Plan: Telephone follow up appointment with care management team member scheduled for:  4 to 6 weeks    TCherre Robins PharmD Clinical Pharmacist [facility] SW [facility]

## 2022-03-12 NOTE — Progress Notes (Signed)
[facility] PREP Weekly Session  Patient Details  Name: [name] MRN: [id number] Date of Birth: 12/11/1945 Age: 76 y.o. PCP: Ann Held, DO  There were no vitals filed for this visit.   [facility] Weekly seesion - 03/12/22 1300       [facility] "PREP" Location   [facility] "PREP" Location [facility]      Weekly Session   Topic Discussed Goal setting and welcome to the program   Review of workbook, tour of facility, option for intro to cardio workout on machine            Yevonne Aline 03/12/2022, 1:17 PM  [facility] PREP Weekly Session  Patient Details  Name: [name] MRN: [id number] Date of Birth: Jul 31, 1946 Age: 76 y.o. PCP: Ann Held, DO  There were no vitals filed for this visit.   [facility] Weekly seesion - 03/12/22 1300       [facility] "PREP" Location   [facility] "PREP" Location [facility]      Weekly Session   Topic Discussed Goal setting and welcome to the program   Review of workbook, tour of facility, option for intro to cardio workout on machine            Linda Grimmer B Alexandera Kuntzman 03/12/2022, 1:17 PM

## 2022-03-26 NOTE — Progress Notes (Signed)
[facility] PREP Weekly Session  Patient Details  Name: [name] MRN: [id number] Date of Birth: [birth year] Age: 76 y.o. PCP: Donato Schultz, DO  Vitals:   03/26/22 1408  Weight: 174 lb (78.9 kg)     [facility] Weekly seesion - 03/26/22 1400       [facility] "PREP" Location   [facility] "PREP" Location [facility]      Weekly Session   Topic Discussed Healthy eating tips   Introduced/encouraged using [facility] app   Classes attended to date 4             Nevaya Nagele B Loray Akard 03/26/2022, 2:09 PM

## 2022-04-06 DIAGNOSIS — Z794 Long term (current) use of insulin: Secondary | ICD-10-CM | POA: Diagnosis not present

## 2022-04-06 DIAGNOSIS — M1711 Unilateral primary osteoarthritis, right knee: Secondary | ICD-10-CM

## 2022-04-06 DIAGNOSIS — I10 Essential (primary) hypertension: Secondary | ICD-10-CM

## 2022-04-06 DIAGNOSIS — M818 Other osteoporosis without current pathological fracture: Secondary | ICD-10-CM

## 2022-04-06 DIAGNOSIS — E785 Hyperlipidemia, unspecified: Secondary | ICD-10-CM

## 2022-04-06 DIAGNOSIS — I2510 Atherosclerotic heart disease of native coronary artery without angina pectoris: Secondary | ICD-10-CM | POA: Diagnosis not present

## 2022-04-06 DIAGNOSIS — E1159 Type 2 diabetes mellitus with other circulatory complications: Secondary | ICD-10-CM | POA: Diagnosis not present

## 2022-04-17 ENCOUNTER — Ambulatory Visit: Payer: Medicare HMO | Admitting: Internal Medicine

## 2022-04-20 ENCOUNTER — Other Ambulatory Visit: Payer: Self-pay | Admitting: Family Medicine

## 2022-04-20 DIAGNOSIS — E785 Hyperlipidemia, unspecified: Secondary | ICD-10-CM

## 2022-04-20 DIAGNOSIS — I10 Essential (primary) hypertension: Secondary | ICD-10-CM

## 2022-04-23 ENCOUNTER — Other Ambulatory Visit: Payer: Self-pay | Admitting: Family Medicine

## 2022-04-23 DIAGNOSIS — I10 Essential (primary) hypertension: Secondary | ICD-10-CM

## 2022-04-24 ENCOUNTER — Encounter: Payer: Medicare HMO | Admitting: Family Medicine

## 2022-05-01 DIAGNOSIS — E1165 Type 2 diabetes mellitus with hyperglycemia: Secondary | ICD-10-CM | POA: Diagnosis not present

## 2022-05-07 ENCOUNTER — Ambulatory Visit (INDEPENDENT_AMBULATORY_CARE_PROVIDER_SITE_OTHER): Payer: Medicare HMO | Admitting: Pharmacist

## 2022-05-07 DIAGNOSIS — E1165 Type 2 diabetes mellitus with hyperglycemia: Secondary | ICD-10-CM | POA: Diagnosis not present

## 2022-05-07 DIAGNOSIS — I10 Essential (primary) hypertension: Secondary | ICD-10-CM | POA: Diagnosis not present

## 2022-05-07 DIAGNOSIS — E785 Hyperlipidemia, unspecified: Secondary | ICD-10-CM | POA: Diagnosis not present

## 2022-05-07 NOTE — Chronic Care Management (AMB) (Signed)
Chronic Care Management Pharmacy Note  05/07/2022 Name:  [name] MRN:  [id number] DOB:  1945/12/25  Summary:  Patient has not been tablet to attend [employer] [facility] classes because she has restarted working part-time.  Patient denies blood glucose < 80 but has had a few blood glucose readings after meals that were 200 to 225. She is using Continuous Glucose Monitor regularly to monitor blood glucose and endorses adherence to diabetes medications.  Packaging was missing losartan and patient missed a few days. Noted to have right ankle swelling after missing doses. She has restarted losartan and is in her current packaging. Patient reports swelling is improved.    Recommended continue current hypertension therapy Check blood pressure at home 2 to 3 times per week and record.   Reviewed insulin dosing and how to use correction (130/25) Tried to get last time in target report but patient unable to locate on Continuous Glucose Monitor over the phone.  Discussed upcoming appointments with provider, endocrinologist and cardiologist. Patient asked for appointment schedule to be mailed to her.   Subjective: [name] is a 76 y.o. year old female who is a primary patient of Ann Held, DO.  The CCM team was consulted for assistance with disease management and care coordination needs.    Engaged with patient by telephone for  to follow up hypertension and other chronic conditions  in response to provider referral for pharmacy case management and/or care coordination services.   Consent to Services:  The patient was given information about Chronic Care Management services, agreed to services, and gave verbal consent prior to initiation of services.  Please see initial visit note for detailed documentation.   Patient Care Team: Carollee Herter, Alferd Apa, DO as PCP - General Buford Dresser, MD as PCP - Cardiology (Cardiology) Ashok Pall, MD as Consulting Physician  (Neurosurgery) Monna Fam, MD as Consulting Physician (Ophthalmology) Cherre Robins, RPH-CPP (Pharmacist) [facility], Melanie Crazier, MD as Consulting Physician (Endocrinology)  Recent office visits: 09/18/2021 - [facility] (Dr Etter SjogrenCheri Rous) Follow up hyperlipidemia. No med changes.  07/17/2021 - [facility] (Dr Etter Sjogren) F/U depression and anxiety. Using alprazolam less since fluoxetine started. Changed rosuvastatin 62m to atorvastatin 456mbecuase patient felt rosuvastatin was causing palpitations at night. Ultrasound of lower extremities ordered - no DVT noted.   Recent consult visits: 03/12/2022 [facility] Endo (Dr ShKelton PillarF/U diabetes. A1c 9.0% increased Tresiba to 44 units daily. -Continue Jardiance 25 mg, 1 tablet in the morning -Continue Novolog 12 units with each meal and correction factor: NovoLog (BG -130/25) 08/08/2021 - GI (Dr JaArdis Hughsseen for colon cancer screening. Discussed the risks and benefits to screening via colonoscopy versus screening via Cologuard stool based test.  She decided to go ahead with Cologuard stool based test    [facility]: [facility]. She saw an orthopedist in August of last year who was going to refer her to Dr. NuKathyrn Sheriffith neurosurgery as it was felt that this was likely radicular pain from her back.  She said that she never did follow-up with a neurosurgeon. suspect that she may have some radicular pain from her back.  I discussed options.  At this point we can prescribe her a short course of pain medication.  She has taken Vicodin before and opted for this.  I offered referral to a back specialist.  She states that she  will follow-up with her PCP regarding this.  She was discharged home in good condition.  Return precautions were given. Objective:  Lab Results  Component Value Date   CREATININE 0.88 12/21/2021   CREATININE 0.75 09/18/2021    CREATININE 0.85 07/17/2021    Lab Results  Component Value Date   HGBA1C 7.9 (A) 12/13/2021   Last diabetic Eye exam:  Lab Results  Component Value Date/Time   HMDIABEYEEXA Retinopathy (A) 12/30/2020 12:00 AM    Last diabetic Foot exam: No results found for: "HMDIABFOOTEX"  - Done 12/29/2020 by Dr Kelton Pillar.     Component Value Date/Time   CHOL 137 12/21/2021 1025   TRIG 72.0 12/21/2021 1025   HDL 41.70 12/21/2021 1025   CHOLHDL 3 12/21/2021 1025   VLDL 14.4 12/21/2021 1025   LDLCALC 81 12/21/2021 1025   LDLCALC 61 06/15/2020 0819   LDLDIRECT 53.0 04/24/2021 1542       Latest Ref Rng & Units 12/21/2021   10:25 AM 09/18/2021   11:12 AM 07/17/2021    9:12 AM  Hepatic Function  Total Protein 6.0 - 8.3 g/dL 6.8  6.4  6.7   Albumin 3.5 - 5.2 g/dL 4.5  4.1  4.3   AST 0 - 37 U/L 18  15  18    ALT 0 - 35 U/L 18  15  14    Alk Phosphatase 39 - 117 U/L 36  42  38   Total Bilirubin 0.2 - 1.2 mg/dL 0.7  0.9  0.6     Lab Results  Component Value Date/Time   TSH 0.58 08/11/2018 02:34 PM   TSH 0.57 07/16/2018 09:50 AM   FREET4 0.74 08/11/2018 02:34 PM   FREET4 0.84 04/16/2013 11:19 AM       Latest Ref Rng & Units 12/21/2021   10:25 AM 12/06/2020    4:03 PM 09/23/2020    2:38 PM  CBC  WBC 4.0 - 10.5 K/uL 7.2  7.0  13.9   Hemoglobin 12.0 - 15.0 g/dL 12.9  12.2  13.5   Hematocrit 36.0 - 46.0 % 39.0  36.6  40.1   Platelets 150.0 - 400.0 K/uL 203.0  182.0  215     No results found for: "VD25OH"  Clinical ASCVD: Yes  The 10-year ASCVD risk score (Arnett DK, et al., 2019) is: 21.9%   Values used to calculate the score:     Age: 76 years     Sex: Female     Is Non-Hispanic African American: Yes     Diabetic: Yes     Tobacco smoker: No     Systolic Blood Pressure: 564 mmHg     Is BP treated: Yes     HDL Cholesterol: 41.7 mg/dL     Total Cholesterol: 137 mg/dL    Other: DEXA 05/29/2017 Forearm Radius 33% T-score = -2.5.  DualFemur Neck Left  T-Score = -2.0; Which was a  statistically significant increase in BMD of the left hip since prior exam dated 12/17/2013.  Social History   Tobacco Use  Smoking Status Former   Packs/day: 0.20   Types: Cigarettes   Quit date: 05/05/2020   Years since quitting: 2.0  Smokeless Tobacco Never  Tobacco Comments   quit in 2021   BP Readings from Last 3 Encounters:  03/06/22 (!) 124/52  02/22/22 (!) 146/78  02/06/22 (!) 144/70   Pulse Readings from Last 3 Encounters:  03/06/22 60  02/22/22 70  02/06/22 (!) 55   Wt Readings from Last 3  Encounters:  04/02/22 176 lb 6.4 oz (80 kg)  03/26/22 174 lb (78.9 kg)  03/06/22 176 lb 12.8 oz (80.2 kg)    Assessment: Review of patient past medical history, allergies, medications, health status, including review of consultants reports, laboratory and other test data, was performed as part of comprehensive evaluation and provision of chronic care management services.   SDOH:  (Social Determinants of Health) assessments and interventions performed:        CCM Care Plan  Allergies  Allergen Reactions   Metformin And Related Other (See Comments)    Sick on stomach, sweaty, throw up, pain   Levemir [Insulin Detemir] Hives and Swelling   Clindamycin Nausea And Vomiting   Crestor [Rosuvastatin Calcium] Palpitations    Medications Reviewed Today     Reviewed by Cherre Robins, RPH-CPP (Pharmacist) on 05/07/22 at 1641  Med List Status: <None>   Medication Order Taking? Sig Documenting Provider Last Dose Status Informant  Alcohol Swabs (B-D SINGLE USE SWABS REGULAR) PADS [id number] No USE AS DIRECTED Carollee Herter, Alferd Apa, DO Taking Active   alendronate (FOSAMAX) 70 MG tablet [id number]  TAKE ONE TABLET BY MOUTH ONCE WEEKLY ON WEDNESDAY Ann Held, DO  Active   ALPRAZolam Duanne Moron) 0.25 MG tablet [id number] No Take 1 tablet by mouth three times a day if needed Ann Held, DO Taking Active   amLODipine (NORVASC) 10 MG tablet [id number]  TAKE ONE TABLET BY  MOUTH ONCE DAILY Ann Held, DO  Active   aspirin EC 81 MG tablet [id number] No Take 81 mg by mouth daily. [provider] Taking Active Self  atorvastatin (LIPITOR) 40 MG tablet [id number] No Take 1 tablet (40 mg total) by mouth daily. Roma Schanz R, DO Taking Active   Blood Glucose Calibration (TAI DOC CONTROL) NORMAL SOLN [id number] No  [provider] Taking Active Self  Blood Glucose Monitoring Suppl (TRUE METRIX AIR GLUCOSE METER) w/Device KIT [id number] No 1 each by Does not apply route 4 (four) times daily. DX CODE E11.8 Ann Held, DO Taking Active   Carboxymethylcellulose Sodium (THERATEARS OP) [id number] No Place 1 drop into both eyes daily as needed (dry eyes). [provider] Taking Active Self  COMFORT EZ PEN NEEDLES 32G X 4 MM MISC [id number] No USE AS DIRECTED TWICE DAILY Shamleffer, Melanie Crazier, MD Taking Active   Continuous Blood Gluc Sensor (DEXCOM G6 SENSOR) MISC [id number] No 1 Device by Does not apply route as directed. Shamleffer, Melanie Crazier, MD Taking Active   Continuous Blood Gluc Transmit (DEXCOM G6 TRANSMITTER) MISC [id number] No 1 Device by Does not apply route as directed. Shamleffer, Melanie Crazier, MD Taking Active   empagliflozin (JARDIANCE) 25 MG TABS tablet [id number] No Take 1 tablet (25 mg total) by mouth daily before breakfast. Shamleffer, Melanie Crazier, MD Taking Active            Med Note Barbaraann Boys May 07, 2022  4:41 PM)    fenofibrate 160 MG tablet [id number]  TAKE ONE TABLET BY MOUTH EVERY EVENING Ann Held, DO  Active   ferrous sulfate 325 (65 FE) MG tablet [id number] No Take 325 mg by mouth daily with breakfast.  [provider] Taking Active Self  fexofenadine (ALLEGRA) 180 MG tablet [id number] No Take 180 mg by mouth daily as needed for allergies or rhinitis. [provider] Taking Active   Flaxseed, Linseed, (FLAXSEED OIL PO) [id number] No Take 1 capsule by  mouth 2 (two) times daily.  [provider] Taking Active Self  FLUoxetine (PROZAC) 20 MG capsule [id number]  TAKE THREE CAPSULES BY MOUTH ONCE DAILY Carollee Herter, Alferd Apa, DO  Active   fluticasone (FLONASE) 50 MCG/ACT nasal spray [id number] No Place 2 sprays into both nostrils daily. Carollee Herter, Kendrick Fries R, DO Taking Active   glucose blood (TRUE METRIX BLOOD GLUCOSE TEST) test strip [id number] No USE AS DIRECTED 4 TIMES A DAY Carollee Herter, Yvonne R, DO Taking Active   insulin aspart (NOVOLOG FLEXPEN) 100 UNIT/ML FlexPen [id number] No Max daily 70 units Shamleffer, Melanie Crazier, MD Taking Active   insulin degludec (TRESIBA FLEXTOUCH) 100 UNIT/ML FlexTouch Pen [id number] No Inject 44 Units into the skin daily. Shamleffer, Melanie Crazier, MD Taking Active   Lancet Devices (LANCING DEVICE) [place] [id number] No  [provider] Taking Active Self  losartan (COZAAR) 100 MG tablet [id number]  TAKE ONE TABLET BY MOUTH ONCE DAILY Carollee Herter, Alferd Apa, DO  Active   metoprolol succinate (TOPROL-XL) 50 MG 24 hr tablet [id number]  TAKE ONE TABLET BY MOUTH ONCE DAILY WITH FOOD Ann Held, DO  Active   Multiple Vitamin (MULTIVITAMIN WITH MINERALS) TABS [id number] No Take 1 tablet by mouth daily. [provider] Taking Active Self  Omega-3 Fatty Acids (FISH OIL) 1000 MG CAPS [id number] No Take 1 capsule by mouth in the morning and at bedtime. [provider] Taking Active            Med Note Elana Alm, Kennon Rounds Oct 14, 2020  9:11 AM)    traMADol (ULTRAM) 50 MG tablet [id number] No Take 1 tablet (50 mg total) by mouth 2 (two) times daily as needed. Aundra Dubin, PA-C Taking Active   TRUEplus Lancets 33G MISC [id number] No USE AS DIRECTED 4 TIMES A DAY Ann Held, DO Taking Active   vitamin E 400 UNIT capsule [id number] No Take 400 Units by mouth daily. [provider] Taking Active Self            Patient Active Problem List   Diagnosis Date Noted    Primary hypertension 09/18/2021   Pain of left calf 09/18/2021   Depression with anxiety 06/21/2021   Diabetes mellitus ([place]) 01/17/2021   Type 2 diabetes mellitus with hyperglycemia, without long-term current use of insulin ([place]) 01/17/2021   Acute pain of right shoulder 08/18/2020   Primary osteoarthritis of right knee 08/18/2020   Impacted cerumen of right ear 05/24/2020   Bilateral impacted cerumen 05/17/2020   Hyperlipidemia associated with type 2 diabetes mellitus ([place]) 03/10/2020   Cough 03/10/2020   Seasonal allergies 03/10/2020   Elevated rheumatoid factor 07/21/2018   Gastroenteritis 07/21/2018   Trigger middle finger of left hand 12/19/2017   Ganglion cyst of dorsum of right wrist 12/19/2017   Morton's neuroma of right foot 12/19/2017   Preventative health care 12/13/2017   Pain in both hands 12/13/2017   Hyperlipidemia LDL goal <100 12/13/2017   Abscess of upper gum 12/13/2017   Spinal stenosis of cervical region 09/25/2016   Cervical radiculopathy 09/06/2016   Dyspnea 02/18/2016   Chest pain 02/18/2016   HLD (hyperlipidemia) 02/18/2016   CAD in native artery 02/18/2016   RLS (restless legs syndrome) 02/18/2016   Pancreatitis 02/18/2016   SOB (shortness of breath) 02/18/2016   Uncontrolled type 2 diabetes mellitus with complication    Tobacco abuse    Osteoarthritis of right knee 07/05/2015   Smoking 07/02/2013   Acute upper  respiratory infections of unspecified site 07/02/2013   Flu vaccine need 07/02/2013   Multinodular goiter 04/16/2013   Bunion 07/07/2012   BACK PAIN, THORACIC REGION, RIGHT 05/24/2009   IRON DEFICIENCY 03/25/2009   GERD 03/25/2009   WEIGHT LOSS 03/25/2009   SINUSITIS- ACUTE-NOS 11/25/2008   Hyperlipidemia LDL goal <70 10/07/2008   ACUTE PANCREATITIS 09/07/2008   OVARIAN CYST 09/07/2008   KNEE PAIN, RIGHT 05/24/2008   CARPAL TUNNEL SYNDROME, RIGHT 05/12/2008   Acute pain of both shoulders 04/02/2008   ROTATOR CUFF REPAIR, RIGHT, HX OF  03/29/2008   GOITER, MULTINODULAR 12/26/2007   Depression 07/07/2007   HIP PAIN, RIGHT 03/11/2007   RESTLESS LEG SYNDROME, HX OF 03/11/2007   Diabetes mellitus type II, uncontrolled ([place]) 03/07/2007   Essential hypertension 03/07/2007    Immunization History  Administered Date(s) Administered   Fluad Quad(high Dose 65+) 07/28/2019, 06/20/2021   Influenza Split 08/08/2012   Influenza, High Dose Seasonal PF 08/09/2014, 07/05/2015, 07/21/2018   PFIZER(Purple Top)SARS-COV-2 Vaccination 12/07/2019, 01/04/2020   PPD Test 08/27/2011, 09/04/2011, 07/13/2014, 09/23/2015   Pneumococcal Conjugate-13 08/09/2014   Pneumococcal Polysaccharide-23 11/17/2013   Td 03/11/2007   Zoster, Live 03/11/2007    Conditions to be addressed/monitored: CAD, HTN, HLD, DMII, Anxiety, Depression and shoulder pain; OA of knee; osteoporosis; h/o pancreatitis  Care Plan : General Pharmacy (Adult)  Updates made by Cherre Robins, RPH-CPP since 05/07/2022 12:00 AM     Problem: Chronic Disease Management support, education, and care coordination needs related to HTN, Hyperlipidemia; CAD, Diabetes, Depression/Anxiety, Osteoporosis, Neuropathy   Priority: High  Onset Date: 02/23/2021  Note:   Current Barriers:  Unable to independently afford treatment regimen Unable to achieve control of type 2 DM  Does not adhere to prescribed medication regimen Chronic Disease Management support, education, and care coordination needs related to HTN, Hyperlipidemia, CAD, Diabetes, Depression/Anxiety, Osteoporosis, Neuropathy  Pharmacist Clinical Goal(s):  Over the next 90 days, patient will verbalize ability to afford treatment regimen achieve control of type 2 DM as evidenced by A1c <7.0% maintain control of HTN and lipids as evidenced by BP <130/80 (per cardio) and LDL <70  adhere to prescribed medication regimen as evidenced by patient report of taking medications daily  through collaboration with PharmD and provider.    Interventions: 1:1 collaboration with Carollee Herter, Alferd Apa, DO regarding development and update of comprehensive plan of care as evidenced by provider attestation and co-signature Inter-disciplinary care team collaboration (see longitudinal plan of care) Comprehensive medication review performed; medication list updated in electronic medical record  Hypertension improving; BP goal <130/80 BP Readings from Last 3 Encounters:  03/06/22 (!) 124/52  02/22/22 (!) 146/78  02/06/22 (!) 144/70  Patient checks BP at home infrequently - usually once per week.  Patient has failed these meds in the past: lisinopril 35m - ineffective Patient reports losartan was not in packaging and she missed 2 or 3 doses. Noticed swelling in right ankle but she has since restarted losartan and swelling has improved.  Current regimen:  Amlodipine 186mdaily Losartan 10028maily (changed 12/21/2021) Metoprolol Succinate 44m84mily Interventions:  Requested patient to check her blood pressure 2 times per week and record  Reminded her to take morning blood pressure medications prior to in office appointments in future. Reviewed blood pressure goals Ensure daily salt intake < 2300 mg/day Continue current regimen for blood pressure  Hyperlipidemia / CAD Controlled; LDL goal < 70 Lab Results  Component Value Date   CHOL 137 12/21/2021   HDL 41.70 12/21/2021  LDLCALC 81 12/21/2021   LDLDIRECT 53.0 04/24/2021   TRIG 72.0 12/21/2021   CHOLHDL 3 12/21/2021   Current regimen:  Aspirin 54m daily Fenofibrate 1671mdaily Omega 3 fish Oil 10304mwice daily Atorvastatin 73m55mily (restarted 07/17/2021) Previous statin tried: rosuvastatin 10mg52mly - caused palpitation Interventions: Continue current regimen for heart health and cholesterol Continue to follow low fat diet Reviewed adherence for atorvastatin   Diabetes Lab Results  Component Value Date   HGBA1C 7.9 (A) 12/13/2021   HGBA1C 9.0 (A)  09/13/2021   HGBA1C 8.4 (A) 06/23/2021   A1c not at goal but has improved since she started new insuiln regimen and is using Continuous Glucose Monitor.  Initial goal is to get A1c <8.0 so patient can have shoulder surgery but eventually would like A1c goal <7.5% as long as doesn't increase hypoglycemia Followed by Dr ShamlKelton Pillarrent regimen:  Novolog 14 with brunch and 12 units with evening meal + additional correction based on blood glucose (130/25) Tresiba 44 units daily Jardiance 25mg 37mke 1 tablet each morning Past medications - Januvia 100mg d53m and glimepiride 2mg qam74miscontinue by Dr ShamleffKelton PillarNovoloin 70/30 insulin - changed 06/2021 due to low efficacy History of pancreatitis - so no GLP1s or DPP4s Denies hypoglycemia or blood glucose <80  Received PAP for Jardiance in 2022 but she has been approved for LIS for 2023 and cost of brand medications is $10.35 per month and generic $0 or $4.95 per month.   Patient endorses she is using DexCom. Reports no blood glucose readings < 80; Occasional blood glucose > 200 when she eats high carb or sugar food. Patient reports blood glucose usually 140 to 150's in morning. Usually < 200 after meals but occasionally 200 to 225. Tried to talk thru checking Time in Range Continuous Glucose Monitor but patient unable to get to needed report.  Interventions: Continue current regimen. Reviewed how to determine if she need additional Novolog for correcting high blood glucose - goal blood glucose is 130. She will take current blood glucose and subtract 130. Then that number divide by 25 to get needed extra Novolog units.  Reviewed insulin regimen, specifically bolus insulin dosing Reviewed foods that can increase blood glucose the most - limit sugar intake and high carbohydrate foods.  Discussed DM goals Fasting (before meal blood sugar): 80-130 Post-Prandial (2 hrs after eating blood sugar): less than 180 A1c (3 month blood sugar average):  less than 7.5%  Depression / anxiety Patient reports anxiety and depression has improved some with increase dose of fluoxetine earlier this month Patient has failed these meds in past: sertraline (palpitations) Current regimen:  Alprazolam 0.25mg thr76mimes daily as needed  Fluoxetine 60mg dail70mterventions:  Continue current medications  Osteoporosis  Last DEXA Scan: 05/29/2017             T-Score femoral neck: -2.0             T-Score forearm radius: -2.5 Current regimen:  Alendronate 70mg weekl73mterventions: (addressed at previous visit) Consider repeat DEXA Scan Continue current regimen  Shoulder Pain / osteoarthritis of right knee:  Surgery is planned once A1c is < 8.0% Current regimen:  Tramadol 50mg up to 81mmes a day if needed for pain Interventions: Discuss importance of taking diabetes meds daily and getting A1c <8.0 so surgery can be scheduled.  Continue to follow up with orthopedic office for pain management   Medication management Pharmacist Clinical Goal(s): Over the next 90 days,  patient will work with PharmD and providers to maintain optimal medication adherence Current pharmacy: [facility] Interventions Comprehensive medication review performed. Continue with pharmacy medication synchronization, packaging and delivery   Patient Goals/Self-Care Activities Over the next 90 days, patient will:  take medications as prescribed Continue current regimen. Reviewed how to determine if she need additional Novolog for correcting high blood glucose - goal blood glucose is 130. She will take current blood glucose and subtract 130. Then that number divide by 25 to get needed extra Novolog units. check glucose with CGM, document, and provide at future appointments, and  collaborate with provider on medication access solutions Check blood pressure 2 to 3 times per week and record Take usual morning blood pressure medications prior to in office appointments in  future.  Follow Up Plan: Telephone follow up appointment with care management team member scheduled for:  4 to 6 weeks  with clinical pharmacist               Medication Assistance:  Applied for LIS for 2023 - patient was approved.     Patient's preferred pharmacy is:  [facility] - [place], [place] - [address] [place] [address] Phone: [phone] Fax: [place], [place] - [address] [address] [place] [address] Phone: [phone] Fax: [place], [place] AT [place] [address] Phone: [phone] Fax: [phone]  [place] [address], [place] [address] Phone: [phone] Fax: [phone]  [id number] [address] [place]Phone: [phone]: [phone] [facility] (New Address) [place] [address] [place]Phone: [phone]: [phone] Follow Up:  Patient agrees to Care Plan and Follow-up.  Plan: Telephone follow up appointment with care management team member scheduled for:  2 to 3 months.     TCherre Robins PharmD Clinical Pharmacist [facility]

## 2022-05-07 NOTE — Patient Instructions (Signed)
Mrs. Wahba I was so glad to speak with you today.  Below is a summary of your health goals and our recent visit. You can also view your update Chronic Care Management Care plan through your MyChart account.   Patient Goals/Self-Care Activities take medications as prescribed Continue current regimen. Reviewed how to determine if she need additional Novolog for correcting high blood glucose - goal blood glucose is 130. She will take current blood glucose and subtract 130. Then that number divide by 25 to get needed extra Novolog units. (Current glucose - 130) / 25 = number of extra units to give to correct high blood glucose  check glucose with CGM, document, and provide at future appointments, and  collaborate with provider on medication access solutions Check blood pressure 2 to 3 times per week and record Take usual morning blood pressure medications prior to in office appointments in future.   As always if you have any questions or concerns especially regarding medications, please feel free to contact me either at the phone number below or with a MyChart message.   Keep up the good work!  [name], PharmD Clinical Pharmacist [facility] [phone] (direct line)  [phone] (main office number)   The patient verbalized understanding of instructions, educational materials, and care plan provided today and agreed to receive a mailed copy of patient instructions, educational materials, and care plan.

## 2022-05-18 ENCOUNTER — Ambulatory Visit (INDEPENDENT_AMBULATORY_CARE_PROVIDER_SITE_OTHER): Payer: Medicare HMO | Admitting: Family Medicine

## 2022-05-18 ENCOUNTER — Encounter: Payer: Self-pay | Admitting: Family Medicine

## 2022-05-18 VITALS — BP 150/70 | HR 61 | Temp 98.5°F | Resp 18 | Ht 65.0 in | Wt 179.4 lb

## 2022-05-18 DIAGNOSIS — E1165 Type 2 diabetes mellitus with hyperglycemia: Secondary | ICD-10-CM | POA: Diagnosis not present

## 2022-05-18 DIAGNOSIS — M545 Low back pain, unspecified: Secondary | ICD-10-CM | POA: Diagnosis not present

## 2022-05-18 DIAGNOSIS — F419 Anxiety disorder, unspecified: Secondary | ICD-10-CM | POA: Diagnosis not present

## 2022-05-18 DIAGNOSIS — I10 Essential (primary) hypertension: Secondary | ICD-10-CM | POA: Diagnosis not present

## 2022-05-18 DIAGNOSIS — Z0000 Encounter for general adult medical examination without abnormal findings: Secondary | ICD-10-CM | POA: Diagnosis not present

## 2022-05-18 DIAGNOSIS — E785 Hyperlipidemia, unspecified: Secondary | ICD-10-CM

## 2022-05-18 MED ORDER — ALPRAZOLAM 0.25 MG PO TABS: ORAL_TABLET | ORAL | 0 refills | Status: DC

## 2022-05-18 NOTE — Patient Instructions (Signed)
Preventive Care 65 Years and Older, Female Preventive care refers to lifestyle choices and visits with your health care provider that can promote health and wellness. Preventive care visits are also called wellness exams. What can I expect for my preventive care visit? Counseling Your health care provider may ask you questions about your: Medical history, including: Past medical problems. Family medical history. Pregnancy and menstrual history. History of falls. Current health, including: Memory and ability to understand (cognition). Emotional well-being. Home life and relationship well-being. Sexual activity and sexual health. Lifestyle, including: Alcohol, nicotine or tobacco, and drug use. Access to firearms. Diet, exercise, and sleep habits. Work and work environment. Sunscreen use. Safety issues such as seatbelt and bike helmet use. Physical exam Your health care provider will check your: Height and weight. These may be used to calculate your BMI (body mass index). BMI is a measurement that tells if you are at a healthy weight. Waist circumference. This measures the distance around your waistline. This measurement also tells if you are at a healthy weight and may help predict your risk of certain diseases, such as type 2 diabetes and high blood pressure. Heart rate and blood pressure. Body temperature. Skin for abnormal spots. What immunizations do I need?  Vaccines are usually given at various ages, according to a schedule. Your health care provider will recommend vaccines for you based on your age, medical history, and lifestyle or other factors, such as travel or where you work. What tests do I need? Screening Your health care provider may recommend screening tests for certain conditions. This may include: Lipid and cholesterol levels. Hepatitis C test. Hepatitis B test. HIV (human immunodeficiency virus) test. STI (sexually transmitted infection) testing, if you are at  risk. Lung cancer screening. Colorectal cancer screening. Diabetes screening. This is done by checking your blood sugar (glucose) after you have not eaten for a while (fasting). Mammogram. Talk with your health care provider about how often you should have regular mammograms. BRCA-related cancer screening. This may be done if you have a family history of breast, ovarian, tubal, or peritoneal cancers. Bone density scan. This is done to screen for osteoporosis. Talk with your health care provider about your test results, treatment options, and if necessary, the need for more tests. Follow these instructions at home: Eating and drinking  Eat a diet that includes fresh fruits and vegetables, whole grains, lean protein, and low-fat dairy products. Limit your intake of foods with high amounts of sugar, saturated fats, and salt. Take vitamin and mineral supplements as recommended by your health care provider. Do not drink alcohol if your health care provider tells you not to drink. If you drink alcohol: Limit how much you have to 0-1 drink a day. Know how much alcohol is in your drink. In the [place] one drink equals one 12 oz bottle of beer (355 mL), one 5 oz glass of wine (148 mL), or one 1 oz glass of hard liquor (44 mL). Lifestyle Brush your teeth every morning and night with fluoride toothpaste. Floss one time each day. Exercise for at least 30 minutes 5 or more days each week. Do not use any products that contain nicotine or tobacco. These products include cigarettes, chewing tobacco, and vaping devices, such as e-cigarettes. If you need help quitting, ask your health care provider. Do not use drugs. If you are sexually active, practice safe sex. Use a condom or other form of protection in order to prevent STIs. Take aspirin only as told by   your health care provider. Make sure that you understand how much to take and what form to take. Work with your health care provider to find out whether it  is safe and beneficial for you to take aspirin daily. Ask your health care provider if you need to take a cholesterol-lowering medicine (statin). Find healthy ways to manage stress, such as: Meditation, yoga, or listening to music. Journaling. Talking to a trusted person. Spending time with friends and family. Minimize exposure to UV radiation to reduce your risk of skin cancer. Safety Always wear your seat belt while driving or riding in a vehicle. Do not drive: If you have been drinking alcohol. Do not ride with someone who has been drinking. When you are tired or distracted. While texting. If you have been using any mind-altering substances or drugs. Wear a helmet and other protective equipment during sports activities. If you have firearms in your house, make sure you follow all gun safety procedures. What's next? Visit your health care provider once a year for an annual wellness visit. Ask your health care provider how often you should have your eyes and teeth checked. Stay up to date on all vaccines. This information is not intended to replace advice given to you by your health care provider. Make sure you discuss any questions you have with your health care provider. Document Revised: 03/22/2021 Document Reviewed: 03/22/2021 Elsevier Patient Education  2023 Elsevier Inc.  

## 2022-05-19 LAB — COMPREHENSIVE METABOLIC PANEL
AG Ratio: 2 (calc) (ref 1.0–2.5)
ALT: 20 U/L (ref 6–29)
AST: 23 U/L (ref 10–35)
Albumin: 4.2 g/dL (ref 3.6–5.1)
Alkaline phosphatase (APISO): 39 U/L (ref 37–153)
BUN/Creatinine Ratio: 22 (calc) (ref 6–22)
BUN: 26 mg/dL — ABNORMAL HIGH (ref 7–25)
CO2: 25 mmol/L (ref 20–32)
Calcium: 9.8 mg/dL (ref 8.6–10.4)
Chloride: 105 mmol/L (ref 98–110)
Creat: 1.19 mg/dL — ABNORMAL HIGH (ref 0.60–1.00)
Globulin: 2.1 g/dL (calc) (ref 1.9–3.7)
Glucose, Bld: 152 mg/dL — ABNORMAL HIGH (ref 65–99)
Potassium: 4 mmol/L (ref 3.5–5.3)
Sodium: 137 mmol/L (ref 135–146)
Total Bilirubin: 0.5 mg/dL (ref 0.2–1.2)
Total Protein: 6.3 g/dL (ref 6.1–8.1)

## 2022-05-19 LAB — CBC WITH DIFFERENTIAL/PLATELET
Absolute Monocytes: 828 cells/uL (ref 200–950)
Basophils Absolute: 53 cells/uL (ref 0–200)
Basophils Relative: 0.7 %
Eosinophils Absolute: 76 cells/uL (ref 15–500)
Eosinophils Relative: 1 %
HCT: 36 % (ref 35.0–45.0)
Hemoglobin: 12.1 g/dL (ref 11.7–15.5)
Lymphs Abs: 3435 cells/uL (ref 850–3900)
MCH: 30.6 pg (ref 27.0–33.0)
MCHC: 33.6 g/dL (ref 32.0–36.0)
MCV: 90.9 fL (ref 80.0–100.0)
MPV: 11.6 fL (ref 7.5–12.5)
Monocytes Relative: 10.9 %
Neutro Abs: 3207 cells/uL (ref 1500–7800)
Neutrophils Relative %: 42.2 %
Platelets: 187 10*3/uL (ref 140–400)
RBC: 3.96 10*6/uL (ref 3.80–5.10)
RDW: 13 % (ref 11.0–15.0)
Total Lymphocyte: 45.2 %
WBC: 7.6 10*3/uL (ref 3.8–10.8)

## 2022-05-19 LAB — LIPID PANEL
Cholesterol: 136 mg/dL (ref <200)
HDL: 37 mg/dL — ABNORMAL LOW (ref >=50)
LDL Cholesterol (Calc): 76 mg/dL (calc)
Non-HDL Cholesterol (Calc): 99 mg/dL (calc) (ref <130)
Total CHOL/HDL Ratio: 3.7 (calc) (ref <5.0)
Triglycerides: 156 mg/dL — ABNORMAL HIGH (ref <150)

## 2022-05-20 NOTE — Progress Notes (Signed)
Subjective:     [name] is a 75 y.o. female and is here for a comprehensive physical exam. The patient reports  no new problems  .  Social History   Socioeconomic History   Marital status: Married    Spouse name: Not on file   Number of children: Not on file   Years of education: Not on file   Highest education level: Not on file  Occupational History   Not on file  Tobacco Use   Smoking status: Former    Packs/day: 0.20    Types: Cigarettes    Quit date: 05/05/2020    Years since quitting: 2.0   Smokeless tobacco: Never   Tobacco comments:    quit in 2021  Vaping Use   Vaping Use: Never used  Substance and Sexual Activity   Alcohol use: No   Drug use: No   Sexual activity: Not Currently  Other Topics Concern   Not on file  Social History Narrative   Exercise: seldom   Caffeine use: seldom   Social Determinants of Health   Financial Resource Strain: Medium Risk (11/09/2021)   Overall Financial Resource Strain (CARDIA)    Difficulty of Paying Living Expenses: Somewhat hard  Food Insecurity: No Food Insecurity (03/08/2022)   Hunger Vital Sign    Worried About Running Out of Food in the Last Year: Never true    Ran Out of Food in the Last Year: Never true  Transportation Needs: No Transportation Needs (11/09/2021)   PRAPARE - Hydrologist (Medical): No    Lack of Transportation (Non-Medical): No  Physical Activity: Inactive (03/08/2022)   Exercise Vital Sign    Days of Exercise per Week: 0 days    Minutes of Exercise per Session: 0 min  Stress: Not on file  Social Connections: Not on file  Intimate Partner Violence: Not on file   Health Maintenance  Topic Date Due   Zoster Vaccines- Shingrix (1 of 2) Never done   COVID-19 Vaccine (3 - Pfizer series) 02/29/2020   OPHTHALMOLOGY EXAM  12/30/2021   INFLUENZA VACCINE  05/08/2022   TETANUS/TDAP  12/14/2023 (Originally 03/10/2017)   FOOT EXAM  06/13/2022   HEMOGLOBIN A1C  06/15/2022    Pneumonia Vaccine 13+ Years old  Completed   DEXA SCAN  Completed   Hepatitis C Screening  Completed   HPV VACCINES  Aged Out   Fecal DNA (Cologuard)  Discontinued    The following portions of the patient's history were reviewed and updated as appropriate: She  has a past medical history of Blockage of coronary artery of heart ([place]), Depression, Diabetes mellitus, Hyperlipidemia, Hypertension, Pancreatitis, and Restless leg syndrome. She does not have any pertinent problems on file. She  has a past surgical history that includes Tubal ligation; Rotator cuff repair; Coronary angioplasty with stent; Foot surgery (Right); Cataract extraction, bilateral; and Rotator cuff repair (Left). Her family history includes Cancer in her mother and sister; Cirrhosis in her sister; Diabetes in her daughter, daughter, sister, sister, and son; Goiter in her daughter; Gout in her sister; Heart Problems in her sister; Heart attack in her brother, father, and son; Hypertension in her mother and sister; Sleep apnea in her sister; Vision loss in her mother. She  reports that she quit smoking about 2 years ago. Her smoking use included cigarettes. She smoked an average of .2 packs per day. She has never used smokeless tobacco. She reports that she does not drink alcohol  and does not use drugs. She has a current medication list which includes the following prescription(s): b-d single use swabs regular, alendronate, amlodipine, aspirin ec, atorvastatin, tai doc control, true metrix air glucose meter, carboxymethylcellulose sodium, comfort ez pen needles, dexcom g6 sensor, dexcom g6 transmitter, empagliflozin, fenofibrate, ferrous sulfate, fexofenadine, flaxseed (linseed), fluoxetine, fluticasone, true metrix blood glucose test, novolog flexpen, tresiba flextouch, lancing device, losartan, metoprolol succinate, multivitamin with minerals, fish oil, tramadol, trueplus lancets 33g, vitamin e, and alprazolam. Current Outpatient  Medications on File Prior to Visit  Medication Sig Dispense Refill   Alcohol Swabs (B-D SINGLE USE SWABS REGULAR) PADS USE AS DIRECTED 400 each 1   alendronate (FOSAMAX) 70 MG tablet TAKE ONE TABLET BY MOUTH ONCE WEEKLY ON WEDNESDAY 12 tablet 3   amLODipine (NORVASC) 10 MG tablet TAKE ONE TABLET BY MOUTH ONCE DAILY 90 tablet 1   aspirin EC 81 MG tablet Take 81 mg by mouth daily.     atorvastatin (LIPITOR) 40 MG tablet Take 1 tablet (40 mg total) by mouth daily. 90 tablet 3   Blood Glucose Calibration (TAI DOC CONTROL) NORMAL SOLN      Blood Glucose Monitoring Suppl (TRUE METRIX AIR GLUCOSE METER) w/Device KIT 1 each by Does not apply route 4 (four) times daily. DX CODE E11.8 1 kit 0   Carboxymethylcellulose Sodium (THERATEARS OP) Place 1 drop into both eyes daily as needed (dry eyes).     COMFORT EZ PEN NEEDLES 32G X 4 MM MISC USE AS DIRECTED TWICE DAILY 200 each 3   Continuous Blood Gluc Sensor (DEXCOM G6 SENSOR) MISC 1 Device by Does not apply route as directed. 9 each 3   Continuous Blood Gluc Transmit (DEXCOM G6 TRANSMITTER) MISC 1 Device by Does not apply route as directed. 1 each 3   empagliflozin (JARDIANCE) 25 MG TABS tablet Take 1 tablet (25 mg total) by mouth daily before breakfast. 90 tablet 3   fenofibrate 160 MG tablet TAKE ONE TABLET BY MOUTH EVERY EVENING 90 tablet 1   ferrous sulfate 325 (65 FE) MG tablet Take 325 mg by mouth daily with breakfast.      fexofenadine (ALLEGRA) 180 MG tablet Take 180 mg by mouth daily as needed for allergies or rhinitis.     Flaxseed, Linseed, (FLAXSEED OIL PO) Take 1 capsule by mouth 2 (two) times daily.      FLUoxetine (PROZAC) 20 MG capsule TAKE THREE CAPSULES BY MOUTH ONCE DAILY 270 capsule 3   fluticasone (FLONASE) 50 MCG/ACT nasal spray Place 2 sprays into both nostrils daily. 48 g 1   glucose blood (TRUE METRIX BLOOD GLUCOSE TEST) test strip USE AS DIRECTED 4 TIMES A DAY 400 strip 1   insulin aspart (NOVOLOG FLEXPEN) 100 UNIT/ML FlexPen Max  daily 70 units 30 mL 6   insulin degludec (TRESIBA FLEXTOUCH) 100 UNIT/ML FlexTouch Pen Inject 44 Units into the skin daily. 45 mL 3   Lancet Devices (LANCING DEVICE) MISC      losartan (COZAAR) 100 MG tablet TAKE ONE TABLET BY MOUTH ONCE DAILY 90 tablet 1   metoprolol succinate (TOPROL-XL) 50 MG 24 hr tablet TAKE ONE TABLET BY MOUTH ONCE DAILY WITH FOOD 90 tablet 1   Multiple Vitamin (MULTIVITAMIN WITH MINERALS) TABS Take 1 tablet by mouth daily.     Omega-3 Fatty Acids (FISH OIL) 1000 MG CAPS Take 1 capsule by mouth in the morning and at bedtime.     traMADol (ULTRAM) 50 MG tablet Take 1 tablet (50 mg total) by  mouth 2 (two) times daily as needed. 60 tablet 2   TRUEplus Lancets 33G MISC USE AS DIRECTED 4 TIMES A DAY 400 each 1   vitamin E 400 UNIT capsule Take 400 Units by mouth daily.     No current facility-administered medications on file prior to visit.   She is allergic to metformin and related, levemir [insulin detemir], clindamycin, and crestor [rosuvastatin calcium]..  Review of Systems Review of Systems  Constitutional: Negative for activity change, appetite change and fatigue.  HENT: Negative for hearing loss, congestion, tinnitus and ear discharge.  dentist q60mEyes: Negative for visual disturbance (see optho q1y -- vision corrected to 20/20 with glasses).  Respiratory: Negative for cough, chest tightness and shortness of breath.   Cardiovascular: Negative for chest pain, palpitations and leg swelling.  Gastrointestinal: Negative for abdominal pain, diarrhea, constipation and abdominal distention.  Genitourinary: Negative for urgency, frequency, decreased urine volume and difficulty urinating.  Musculoskeletal: Negative for back pain, arthralgias and gait problem.  Skin: Negative for color change, pallor and rash.  Neurological: Negative for dizziness, light-headedness, numbness and headaches.  Hematological: Negative for adenopathy. Does not bruise/bleed easily.   Psychiatric/Behavioral: Negative for suicidal ideas, confusion, sleep disturbance, self-injury, dysphoric mood, decreased concentration and agitation.      Objective:    BP (!) 150/70 (BP Location: Left Arm, Patient Position: Sitting, Cuff Size: Normal)   Pulse 61   Temp 98.5 F (36.9 C) (Oral)   Resp 18   Ht _0  (1.651 m)   Wt 179 lb 6.4 oz (81.4 kg)   SpO2 97%   BMI 29.85 kg/m  General appearance: alert, cooperative, appears stated age, and no distress Head: Normocephalic, without obvious abnormality, atraumatic Eyes: conjunctivae/corneas clear. PERRL, EOM's intact. Fundi benign. Ears: normal TM's and external ear canals both ears Nose: Nares normal. Septum midline. Mucosa normal. No drainage or sinus tenderness. Throat: lips, mucosa, and tongue normal; teeth and gums normal Neck: no adenopathy, no carotid bruit, no JVD, supple, symmetrical, trachea midline, and thyroid not enlarged, symmetric, no tenderness/mass/nodules Back: symmetric, no curvature. ROM normal. No CVA tenderness. Lungs: clear to auscultation bilaterally Heart: regular rate and rhythm, S1, S2 normal, no murmur, click, rub or gallop Abdomen: soft, non-tender; bowel sounds normal; no masses,  no organomegaly Pelvic: deferred Extremities: extremities normal, atraumatic, no cyanosis or edema Pulses: 2+ and symmetric Skin: Skin color, texture, turgor normal. No rashes or lesions Lymph nodes: Cervical, supraclavicular, and axillary nodes normal. Neurologic: Alert and oriented X 3, normal strength and tone. Normal symmetric reflexes. Normal coordination and gait    Assessment:    Healthy female exam.      Plan:    Ghm utd Check labs  See After Visit Summary for Counseling Recommendations    1. Anxiety Stable ---- only taking prn xanax  - ALPRAZolam (XANAX) 0.25 MG tablet; Take 1 tablet by mouth three times a day if needed  Dispense: 60 tablet; Refill: 0  2. Primary hypertension Well controlled, no  changes to meds. Encouraged heart healthy diet such as the DASH diet and exercise as tolerated.   - CBC with Differential/Platelet - Comprehensive metabolic panel - Lipid panel  3. Hyperlipidemia, unspecified hyperlipidemia type Encourage heart healthy diet such as MIND or DASH diet, increase exercise, avoid trans fats, simple carbohydrates and processed foods, consider a krill or fish or flaxseed oil cap daily.   - CBC with Differential/Platelet - Comprehensive metabolic panel - Lipid panel  4. Type 2 diabetes mellitus with hyperglycemia,  without long-term current use of insulin (HCC) Check labs  - CBC with Differential/Platelet - Comprehensive metabolic panel - Lipid panel  5. Preventative health care See above   - CBC with Differential/Platelet - Comprehensive metabolic panel - Lipid panel  6. Low back pain with radiation   - Ambulatory referral to Orthopedic Surgery

## 2022-05-31 ENCOUNTER — Ambulatory Visit: Payer: Medicare HMO | Admitting: Orthopaedic Surgery

## 2022-05-31 DIAGNOSIS — M5416 Radiculopathy, lumbar region: Secondary | ICD-10-CM | POA: Diagnosis not present

## 2022-05-31 NOTE — Progress Notes (Signed)
Office Visit Note   Patient: [name]           Date of Birth: January 31, 1946           MRN: [id number] Visit Date: 05/31/2022              Requested by: [address] [place], [place] [address] [place],  [place] [address] PCP: [name], [name], DO   Assessment & Plan: Visit Diagnoses:  1. Radiculopathy, lumbar region     Plan: Patient returns today for ongoing low back pain and left leg radiculopathy.  She has been taking Tylenol for the pain.  The back pain started about 6 weeks ago.  We saw her a year ago for this and made a referral to Dr. Conchita Paris but she states that she never heard anything from their office.  She denies any red flag symptoms.  Examination of the left lower extremity is nonfocal.  Impression is chronic lumbar radiculopathy.  Treatment options were reviewed to include medications, physical therapy, ESI, referral to Dr. Conchita Paris.  Patient be referred back to Dr. Conchita Paris.  We will see her back as needed.  Follow-Up Instructions: No follow-ups on file.   Orders:  No orders of the defined types were placed in this encounter.  No orders of the defined types were placed in this encounter.     Procedures: No procedures performed   Clinical Data: No additional findings.   Subjective: Chief Complaint  Patient presents with   Lower Back - Pain    HPI  Review of Systems   Objective: Vital Signs: There were no vitals taken for this visit.  Physical Exam  Ortho Exam  Specialty Comments:  No specialty comments available.  Imaging: No results found.   PMFS History: Patient Active Problem List   Diagnosis Date Noted   Radiculopathy, lumbar region 05/31/2022   Primary hypertension 09/18/2021   Pain of left calf 09/18/2021   Depression with anxiety 06/21/2021   Diabetes mellitus (HCC) 01/17/2021   Type 2 diabetes mellitus with hyperglycemia, without long-term current use of insulin (HCC) 01/17/2021   Acute pain of  right shoulder 08/18/2020   Primary osteoarthritis of right knee 08/18/2020   Impacted cerumen of right ear 05/24/2020   Bilateral impacted cerumen 05/17/2020   Hyperlipidemia associated with type 2 diabetes mellitus (HCC) 03/10/2020   Cough 03/10/2020   Seasonal allergies 03/10/2020   Elevated rheumatoid factor 07/21/2018   Gastroenteritis 07/21/2018   Trigger middle finger of left hand 12/19/2017   Ganglion cyst of dorsum of right wrist 12/19/2017   Morton's neuroma of right foot 12/19/2017   Preventative health care 12/13/2017   Pain in both hands 12/13/2017   Hyperlipidemia LDL goal <100 12/13/2017   Abscess of upper gum 12/13/2017   Spinal stenosis of cervical region 09/25/2016   Cervical radiculopathy 09/06/2016   Dyspnea 02/18/2016   Chest pain 02/18/2016   HLD (hyperlipidemia) 02/18/2016   CAD in native artery 02/18/2016   RLS (restless legs syndrome) 02/18/2016   Pancreatitis 02/18/2016   SOB (shortness of breath) 02/18/2016   Uncontrolled type 2 diabetes mellitus with complication    Tobacco abuse    Osteoarthritis of right knee 07/05/2015   Smoking 07/02/2013   Acute upper respiratory infections of unspecified site 07/02/2013   Flu vaccine need 07/02/2013   Multinodular goiter 04/16/2013   Bunion 07/07/2012   BACK PAIN, THORACIC REGION, RIGHT 05/24/2009   IRON DEFICIENCY 03/25/2009   GERD  03/25/2009   WEIGHT LOSS 03/25/2009   SINUSITIS- ACUTE-NOS 11/25/2008   Hyperlipidemia LDL goal <70 10/07/2008   ACUTE PANCREATITIS 09/07/2008   OVARIAN CYST 09/07/2008   KNEE PAIN, RIGHT 05/24/2008   CARPAL TUNNEL SYNDROME, RIGHT 05/12/2008   Acute pain of both shoulders 04/02/2008   ROTATOR CUFF REPAIR, RIGHT, HX OF 03/29/2008   GOITER, MULTINODULAR 12/26/2007   Depression 07/07/2007   HIP PAIN, RIGHT 03/11/2007   RESTLESS LEG SYNDROME, HX OF 03/11/2007   Diabetes mellitus type II, uncontrolled (HCC) 03/07/2007   Essential hypertension 03/07/2007   Past Medical  History:  Diagnosis Date   Blockage of coronary artery of heart (HCC)    Depression    Diabetes mellitus    Hyperlipidemia    Hypertension    Pancreatitis    Restless leg syndrome     Family History  Problem Relation Age of Onset   Heart attack Father    Heart attack Brother    Diabetes Sister    Cirrhosis Sister    Hypertension Sister    Cancer Mother    Hypertension Mother    Vision loss Mother    Cancer Sister    Sleep apnea Sister    Heart Problems Sister    Diabetes Sister    Gout Sister    Diabetes Son    Heart attack Son    Diabetes Daughter    Goiter Daughter    Diabetes Daughter     Past Surgical History:  Procedure Laterality Date   CATARACT EXTRACTION, BILATERAL     CORONARY ANGIOPLASTY WITH STENT PLACEMENT     FOOT SURGERY Right    cyst removal   ROTATOR CUFF REPAIR     right   ROTATOR CUFF REPAIR Left    TUBAL LIGATION     Social History   Occupational History   Not on file  Tobacco Use   Smoking status: Former    Packs/day: 0.20    Types: Cigarettes    Quit date: 05/05/2020    Years since quitting: 2.0   Smokeless tobacco: Never   Tobacco comments:    quit in 2021  Vaping Use   Vaping Use: Never used  Substance and Sexual Activity   Alcohol use: No   Drug use: No   Sexual activity: Not Currently

## 2022-06-05 NOTE — Progress Notes (Signed)
PREP program start date 03/12/22, did not complete, last class attended 04/04/2022.

## 2022-06-28 ENCOUNTER — Encounter: Payer: Self-pay | Admitting: Internal Medicine

## 2022-06-28 ENCOUNTER — Ambulatory Visit: Payer: Medicare HMO | Admitting: Internal Medicine

## 2022-06-28 VITALS — BP 130/70 | HR 74 | Ht 65.0 in | Wt 173.0 lb

## 2022-06-28 DIAGNOSIS — E1159 Type 2 diabetes mellitus with other circulatory complications: Secondary | ICD-10-CM | POA: Diagnosis not present

## 2022-06-28 DIAGNOSIS — Z794 Long term (current) use of insulin: Secondary | ICD-10-CM | POA: Diagnosis not present

## 2022-06-28 DIAGNOSIS — E1165 Type 2 diabetes mellitus with hyperglycemia: Secondary | ICD-10-CM

## 2022-06-28 LAB — POCT GLYCOSYLATED HEMOGLOBIN (HGB A1C): Hemoglobin A1C: 8.6 % — AB (ref 4.0–5.6)

## 2022-06-28 MED ORDER — TRESIBA FLEXTOUCH 100 UNIT/ML ~~LOC~~ SOPN
45.0000 [IU] | PEN_INJECTOR | Freq: Every day | SUBCUTANEOUS | 6 refills | Status: DC
Start: 2022-06-28 — End: 2022-07-23

## 2022-06-28 MED ORDER — EMPAGLIFLOZIN 25 MG PO TABS
25.0000 mg | ORAL_TABLET | Freq: Every day | ORAL | 3 refills | Status: DC
Start: 2022-06-28 — End: 2023-05-21

## 2022-06-28 MED ORDER — NOVOLOG FLEXPEN 100 UNIT/ML ~~LOC~~ SOPN: PEN_INJECTOR | SUBCUTANEOUS | 6 refills | Status: DC

## 2022-06-28 NOTE — Progress Notes (Signed)
Name: [name]  Age/ Sex: 76 y.o., female   MRN/ DOB: [id number], 11/12/1945     PCP: Ann Held, DO   Reason for Endocrinology Evaluation: Type 2 Diabetes Mellitus  Initial Endocrine Consultative Visit: 01/17/2021    PATIENT IDENTIFIER: [name] is a 76 y.o. female with a past medical history of HTN, T2Dm, CAD , Osteoporosis and Hx of pancreatitis. The patient has followed with Endocrinology clinic since 4 f/09/2021 or consultative assistance with management of her diabetes.  DIABETIC HISTORY:  [name] was diagnosed with DM in 1998, has intolerance to Metformin due to Gi side effects. Her hemoglobin A1c has ranged from 9.3% in 2015, peaking at 10.1% in 2020.  She has history of Pancreatitis that she believes is caused Metformin   On her initial visit to our clinic she had an A1c of 9.4%  , she was on Jardiance,Glimepiride , Januvia and insulin mix . We stopped Glimepiride as she was not taking it, we stopped Januvia due to hx of pancreatitis. We continued jardiance and adjusted insulin mix     Switched insulin Mix to Basal/prandial insulin 05/2021   SUBJECTIVE:   During the last visit (12/13/2021): A1c 7.9%    Today (06/28/2022): [name] is here for a follow up on diabetes management. She checks her blood sugars 2-3 times daily. The patient has not had hypoglycemic episodes since the last clinic visit.  She did not bring a meter today    She is waiting to have right shoulder sx but waiting on A1c to be less then 8%   She denies nausea, vomiting or diarrhea  She has an episode of incontinence last night that she attributes to nuts      HOME DIABETES REGIMEN:  Jardiance 25 mg, 1 tablet in the morning Tresiba 44 units ONCE daily  Novolog 14 units with brunch and 12 units with supper Correction factor: NovoLog (BG -130/25)      Statin: yes ACE-I/ARB: yes   CONTINUOUS GLUCOSE MONITORING RECORD INTERPRETATION    Dates of  Recording: 9/8-9/21/2023  Sensor description: dexcom  Results statistics:   CGM use % of time 93  Average and SD 193/52  Time in range 46  % Time Above 180 38  % Time above 250 15  % Time Below target <1    Glycemic patterns summary: BG is optimal overnight, hyperglycemia noted during the day  Hyperglycemic episodes postprandial  Hypoglycemic episodes occurred n/a   Overnight periods: optimal     DIABETIC COMPLICATIONS: Microvascular complications:   Denies: CKD, retinopathy, neuropathy Last Eye Exam: Completed 12/2020 scheduled 01/2022  Macrovascular complications:  CAD ( S/P stent placement)  Denies: CVA, PVD   HISTORY:  Past Medical History:  Past Medical History:  Diagnosis Date   Blockage of coronary artery of heart (HCC)    Depression    Diabetes mellitus    Hyperlipidemia    Hypertension    Pancreatitis    Restless leg syndrome    Past Surgical History:  Past Surgical History:  Procedure Laterality Date   CATARACT EXTRACTION, BILATERAL     CORONARY ANGIOPLASTY WITH STENT PLACEMENT     FOOT SURGERY Right    cyst removal   ROTATOR CUFF REPAIR     right   ROTATOR CUFF REPAIR Left    TUBAL LIGATION     Social History:  reports that she quit smoking about 2 years ago. Her smoking use included cigarettes. She smoked an average  of .2 packs per day. She has never used smokeless tobacco. She reports that she does not drink alcohol and does not use drugs. Family History:  Family History  Problem Relation Age of Onset   Heart attack Father    Heart attack Brother    Diabetes Sister    Cirrhosis Sister    Hypertension Sister    Cancer Mother    Hypertension Mother    Vision loss Mother    Cancer Sister    Sleep apnea Sister    Heart Problems Sister    Diabetes Sister    Gout Sister    Diabetes Son    Heart attack Son    Diabetes Daughter    Goiter Daughter    Diabetes Daughter      HOME MEDICATIONS: Allergies as of 06/28/2022        Reactions   Metformin And Related Other (See Comments)   Sick on stomach, sweaty, throw up, pain   Levemir [insulin Detemir] Hives, Swelling   Clindamycin Nausea And Vomiting   Crestor [rosuvastatin Calcium] Palpitations        Medication List        Accurate as of June 28, 2022  7:37 AM. If you have any questions, ask your nurse or doctor.          alendronate 70 MG tablet Commonly known as: FOSAMAX TAKE ONE TABLET BY MOUTH ONCE WEEKLY ON WEDNESDAY   ALPRAZolam 0.25 MG tablet Commonly known as: XANAX Take 1 tablet by mouth three times a day if needed   amLODipine 10 MG tablet Commonly known as: NORVASC TAKE ONE TABLET BY MOUTH ONCE DAILY   aspirin EC 81 MG tablet Take 81 mg by mouth daily.   atorvastatin 40 MG tablet Commonly known as: LIPITOR Take 1 tablet (40 mg total) by mouth daily.   B-D SINGLE USE SWABS REGULAR Pads USE AS DIRECTED   Comfort EZ Pen Needles 32G X 4 MM Misc Generic drug: Insulin Pen Needle USE AS DIRECTED TWICE DAILY   Dexcom G6 Sensor Misc 1 Device by Does not apply route as directed.   Dexcom G6 Transmitter Misc 1 Device by Does not apply route as directed.   empagliflozin 25 MG Tabs tablet Commonly known as: Jardiance Take 1 tablet (25 mg total) by mouth daily before breakfast.   fenofibrate 160 MG tablet TAKE ONE TABLET BY MOUTH EVERY EVENING   ferrous sulfate 325 (65 FE) MG tablet Take 325 mg by mouth daily with breakfast.   fexofenadine 180 MG tablet Commonly known as: ALLEGRA Take 180 mg by mouth daily as needed for allergies or rhinitis.   Fish Oil 1000 MG Caps Take 1 capsule by mouth in the morning and at bedtime.   FLAXSEED OIL PO Take 1 capsule by mouth 2 (two) times daily.   FLUoxetine 20 MG capsule Commonly known as: PROZAC TAKE THREE CAPSULES BY MOUTH ONCE DAILY   fluticasone 50 MCG/ACT nasal spray Commonly known as: FLONASE Place 2 sprays into both nostrils daily.   Lancing Device Misc    losartan 100 MG tablet Commonly known as: COZAAR TAKE ONE TABLET BY MOUTH ONCE DAILY   metoprolol succinate 50 MG 24 hr tablet Commonly known as: TOPROL-XL TAKE ONE TABLET BY MOUTH ONCE DAILY WITH FOOD   multivitamin with minerals Tabs tablet Take 1 tablet by mouth daily.   NovoLOG FlexPen 100 UNIT/ML FlexPen Generic drug: insulin aspart Max daily 70 units   Tai Doc Control Normal Soln  THERATEARS OP Place 1 drop into both eyes daily as needed (dry eyes).   traMADol 50 MG tablet Commonly known as: ULTRAM Take 1 tablet (50 mg total) by mouth 2 (two) times daily as needed.   Tyler Aas FlexTouch 100 UNIT/ML FlexTouch Pen Generic drug: insulin degludec Inject 44 Units into the skin daily.   True Metrix Air Glucose Meter w/Device Kit 1 each by Does not apply route 4 (four) times daily. DX CODE E11.8   True Metrix Blood Glucose Test test strip Generic drug: glucose blood USE AS DIRECTED 4 TIMES A DAY   TRUEplus Lancets 33G Misc USE AS DIRECTED 4 TIMES A DAY   vitamin E 180 MG (400 UNITS) capsule Take 400 Units by mouth daily.         OBJECTIVE:   Vital Signs: There were no vitals taken for this visit.  Wt Readings from Last 3 Encounters:  05/18/22 179 lb 6.4 oz (81.4 kg)  04/02/22 176 lb 6.4 oz (80 kg)  03/26/22 174 lb (78.9 kg)     Exam: General: Pt appears well and is in NAD  Lungs: Clear with good BS bilat with no rales, rhonchi, or wheezes  Heart: RRR   Extremities: No pretibial edema.   Neuro: MS is good with appropriate affect, pt is alert and Ox3    DM foot exam: 06/23/2021   The skin of the feet is intact without sores or ulcerations. The pedal pulses are 2+ on right and 2+ on left. The sensation is intact to a screening 5.07, 10 gram monofilament bilaterally       DATA REVIEWED:  Lab Results  Component Value Date   HGBA1C 7.9 (A) 12/13/2021   HGBA1C 9.0 (A) 09/13/2021   HGBA1C 8.4 (A) 06/23/2021   Lab Results  Component Value Date    MICROALBUR 39.3 (H) 12/21/2021   LDLCALC 76 05/18/2022   CREATININE 1.19 (H) 05/18/2022   Lab Results  Component Value Date   MICRALBCREAT 31.7 (H) 12/21/2021     Lab Results  Component Value Date   CHOL 136 05/18/2022   HDL 37 (L) 05/18/2022   LDLCALC 76 05/18/2022   LDLDIRECT 53.0 04/24/2021   TRIG 156 (H) 05/18/2022   CHOLHDL 3.7 05/18/2022       Latest Reference Range & Units 07/17/21 09:12  Sodium 135 - 145 mEq/L 137  Potassium 3.5 - 5.1 mEq/L 4.2  Chloride 96 - 112 mEq/L 103  CO2 19 - 32 mEq/L 26  Glucose 70 - 99 mg/dL 149 (H)  BUN 6 - 23 mg/dL 22  Creatinine 0.40 - 1.20 mg/dL 0.85  Calcium 8.4 - 10.5 mg/dL 9.7  Alkaline Phosphatase 39 - 117 U/L 38 (L)  Albumin 3.5 - 5.2 g/dL 4.3  AST 0 - 37 U/L 18  ALT 0 - 35 U/L 14  Total Protein 6.0 - 8.3 g/dL 6.7  Total Bilirubin 0.2 - 1.2 mg/dL 0.6  GFR >60.00 mL/min 66.97   ASSESSMENT / PLAN / RECOMMENDATIONS:   1) Type 2 Diabetes Mellitus, poorly controlled , With macrovascular  complications - Most recent A1c of 8.6  %. Goal A1c < 7.0 %.     -Patient with worsening glycemic control, A1c increased from 7.9% to 8.6%,  unfortunately she has been eating more sweets than usual -She is compliant with her insulin intake, will make following adjustments -Patient again advised to avoid sugar sweetened foods, limit carbohydrates so that her A1c will trend down enough to be cleared for surgery - GLP-1  agonists and DPP-4 inhibitors are contra-indicated due to hx of pancreatitis  -Today we discussed adding pioglitazone, cautioned against lower extremity edema, she is hesitant at this time  -In reviewing CGM download the patient has been noted with hyperglycemia through the night but worse after supper,   MEDICATIONS:  -Continue Jardiance 25 mg, 1 tablet in the morning  -Increase Tresiba 48 units ONCE daily  -Change Novolog 16 units with brunch and continue 14 units with supper -Continue correction factor: NovoLog (BG  -130/25)     EDUCATION / INSTRUCTIONS: BG monitoring instructions: Patient is instructed to check her blood sugars 3 times a day, before meals. Call [place] [facility] clinic if: BG persistently < 70  I reviewed the Rule of 15 for the treatment of hypoglycemia in detail with the patient. Literature supplied.   2) Diabetic complications:  Eye: Does not have known diabetic retinopathy.  Neuro/ Feet: Does not have known diabetic peripheral neuropathy .  Renal: Patient does not have known baseline CKD. She   is  on an ACEI/ARB at present.      F/U in 4 months     Signed electronically by: Mack Guise, MD  [facility]  [facility] [address] [place], [place] [address] Phone: [phone] FAX: [phone]   CC: [address] [place] [address] Phone: [phone]  Fax: [phone]  Return to Endocrinology clinic as below: Future Appointments  Date Time Provider [place]  06/28/2022 11:50 AM Blessing Ozga, Melanie Crazier, MD [facility] None  07/23/2022  2:30 PM [facility] PHARMACIST [facility]  08/07/2022 10:30 AM Loel Dubonnet, NP [facility]  11/19/2022 10:00 AM Ann Held, DO [facility]

## 2022-06-28 NOTE — Patient Instructions (Addendum)
-   Continue Jardiance 25 mg, 1 tablet in the morning  - Increase Tresiba 48 units ONCE daily  - Increase  Novolog 16 units with Brunch , 14 units with Supper  -Novolog correctional insulin: ADD extra units on insulin to your meal-time Novolog dose if your blood sugars are higher than 155. Use the scale below to help guide you:   Blood sugar before meal Number of units to inject  Less than 155 0 unit  156 -  180 1 units  181 -  205 2 units  206 -  230 3 units  231 -  255 4 units  256 -  280 5 units  281 -  305 6 units  306 -  330 7 units  331 -  355 8 units         HOW TO TREAT LOW BLOOD SUGARS (Blood sugar LESS THAN 70 MG/DL) Please follow the RULE OF 15 for the treatment of hypoglycemia treatment (when your (blood sugars are less than 70 mg/dL)   STEP 1: Take 15 grams of carbohydrates when your blood sugar is low, which includes:  3-4 GLUCOSE TABS  OR 3-4 OZ OF JUICE OR REGULAR SODA OR ONE TUBE OF GLUCOSE GEL    STEP 2: RECHECK blood sugar in 15 MINUTES STEP 3: If your blood sugar is still low at the 15 minute recheck --> then, go back to STEP 1 and treat AGAIN with another 15 grams of carbohydrates

## 2022-07-20 ENCOUNTER — Other Ambulatory Visit: Payer: Self-pay | Admitting: Family Medicine

## 2022-07-20 DIAGNOSIS — E1169 Type 2 diabetes mellitus with other specified complication: Secondary | ICD-10-CM

## 2022-07-23 ENCOUNTER — Ambulatory Visit: Payer: Medicare HMO | Admitting: Pharmacist

## 2022-07-23 DIAGNOSIS — E1165 Type 2 diabetes mellitus with hyperglycemia: Secondary | ICD-10-CM

## 2022-07-23 MED ORDER — TRESIBA FLEXTOUCH 100 UNIT/ML ~~LOC~~ SOPN: 48.0000 [IU] | PEN_INJECTOR | Freq: Every day | SUBCUTANEOUS | 0 refills | Status: DC

## 2022-07-23 NOTE — Progress Notes (Signed)
Pharmacy Note  07/23/2022 Name: [name] MRN: [id number] DOB: 28-Jan-1946  Subjective: [name] is a 76 y.o. year old female who is a primary care patient of Carollee Herter, Alferd Apa, DO. Clinical Pharmacist Practitioner referral was placed to assist with medication management.    Engaged with patient by telephone for follow up visit today.  Reviewed medication list with patient. Updated recent changes in insulin therapy per last OV with Dr Kelton Pillar. Patient states she is due to receive her packaged medications around 08/01/2022 and will start taking them 08/08/2022 but she will run out of [place] before delivery is due since dose was increased 06/28/2022.   Patient denies blood glucose < 80 but has had a few blood glucose readings after meals that were 200 to 225. She is using Continuous Glucose Monitor regularly to monitor blood glucose and endorses adherence to diabetes medications.   Recent Visits:  06/28/2022 - Endo (Dr Kelton Pillar) F/U type 2 DM. Increased insulin degludec from 44 units daily to 45 units daily per Rx but notes say take 48 units daily of Tresiba. Increased Novolog to 16 units with brunch and 14 units with supper. Continue correction of 130/25. F/U 4 months.    Objective: Review of patient status, including review of consultants reports, laboratory and other test data, was performed as part of comprehensive evaluation and provision of chronic care management services.   Lab Results  Component Value Date   CREATININE 1.19 (H) 05/18/2022   CREATININE 0.88 12/21/2021   CREATININE 0.75 09/18/2021    Lab Results  Component Value Date   HGBA1C 8.6 (A) 06/28/2022       Component Value Date/Time   CHOL 136 05/18/2022 1433   TRIG 156 (H) 05/18/2022 1433   HDL 37 (L) 05/18/2022 1433   CHOLHDL 3.7 05/18/2022 1433   VLDL 14.4 12/21/2021 1025   LDLCALC 76 05/18/2022 1433   LDLDIRECT 53.0 04/24/2021 1542     Clinical ASCVD: Yes  The 10-year ASCVD  risk score (Arnett DK, et al., 2019) is: 22%   Values used to calculate the score:     Age: 89 years     Sex: Female     Is Non-Hispanic African American: Yes     Diabetic: Yes     Tobacco smoker: No     Systolic Blood Pressure: 025 mmHg     Is BP treated: Yes     HDL Cholesterol: 37 mg/dL     Total Cholesterol: 136 mg/dL    BP Readings from Last 3 Encounters:  06/28/22 130/70  05/18/22 (!) 150/70  03/06/22 (!) 124/52     Allergies  Allergen Reactions   Metformin And Related Other (See Comments)    Sick on stomach, sweaty, throw up, pain   Levemir [Insulin Detemir] Hives and Swelling   Clindamycin Nausea And Vomiting   Crestor [Rosuvastatin Calcium] Palpitations    Medications Reviewed Today     Reviewed by Cherre Robins, RPH-CPP (Pharmacist) on 07/23/22 at [place] List Status: <None>   Medication Order Taking? Sig Documenting Provider Last Dose Status Informant  Alcohol Swabs (B-D SINGLE USE SWABS REGULAR) PADS [id number] Yes USE AS DIRECTED Carollee Herter, Alferd Apa, DO Taking Active   alendronate (FOSAMAX) 70 MG tablet [id number] Yes TAKE ONE TABLET BY MOUTH ONCE WEEKLY ON WEDNESDAY Ann Held, DO Taking Active   ALPRAZolam Duanne Moron) 0.25 MG tablet [id number] Yes Take 1 tablet by mouth three times a day if needed Lowne  Koren Shiver, DO Taking Active   amLODipine (NORVASC) 10 MG tablet [id number] Yes TAKE ONE TABLET BY MOUTH ONCE DAILY Ann Held, DO Taking Active   aspirin EC 81 MG tablet [id number] Yes Take 81 mg by mouth daily. [provider] Taking Active Self  atorvastatin (LIPITOR) 40 MG tablet [id number] Yes TAKE ONE TABLET BY MOUTH EVERY EVENING Roma Schanz R, DO Taking Active   Blood Glucose Calibration (TAI DOC CONTROL) NORMAL SOLN [id number] Yes  [provider] Taking Active Self  Blood Glucose Monitoring Suppl (TRUE METRIX AIR GLUCOSE METER) w/Device KIT [id number] Yes 1 each by Does not apply route 4 (four) times daily. DX  CODE E11.8 Ann Held, DO Taking Active   Carboxymethylcellulose Sodium (THERATEARS OP) [id number] Yes Place 1 drop into both eyes daily as needed (dry eyes). [provider] Taking Active Self  COMFORT EZ PEN NEEDLES 32G X 4 MM MISC [id number] Yes USE AS DIRECTED TWICE DAILY Shamleffer, Melanie Crazier, MD Taking Active   Continuous Blood Gluc Sensor (DEXCOM G6 SENSOR) [place] [id number] Yes 1 Device by Does not apply route as directed. Shamleffer, Melanie Crazier, MD Taking Active   Continuous Blood Gluc Transmit (DEXCOM G6 TRANSMITTER) MISC [id number] Yes 1 Device by Does not apply route as directed. Shamleffer, Melanie Crazier, MD Taking Active   empagliflozin (JARDIANCE) 25 MG TABS tablet [id number] Yes Take 1 tablet (25 mg total) by mouth daily before breakfast. Shamleffer, Melanie Crazier, MD Taking Active   fenofibrate 160 MG tablet [id number] Yes TAKE ONE TABLET BY MOUTH EVERY EVENING Ann Held, DO Taking Active   ferrous sulfate 325 (65 FE) MG tablet [id number] Yes Take 325 mg by mouth daily with breakfast.  [provider] Taking Active Self  fexofenadine (ALLEGRA) 180 MG tablet [id number] Yes Take 180 mg by mouth daily as needed for allergies or rhinitis. [provider] Taking Active   Flaxseed, Linseed, (FLAXSEED OIL PO) [id number] Yes Take 1 capsule by mouth 2 (two) times daily.  [provider] Taking Active Self  FLUoxetine (PROZAC) 20 MG capsule [id number] Yes TAKE THREE CAPSULES BY MOUTH ONCE DAILY Carollee Herter, Alferd Apa, DO Taking Active   fluticasone [facility] 50 MCG/ACT nasal spray [id number] Yes PLACE TWO SPRAYS IN [facility] NOSTRIL DAILY Roma Schanz R, DO Taking Active   glucose blood (TRUE METRIX BLOOD GLUCOSE TEST) test strip [id number] Yes USE AS DIRECTED 4 TIMES A DAY Carollee Herter, Yvonne R, DO Taking Active   insulin aspart (NOVOLOG FLEXPEN) 100 UNIT/ML FlexPen [id number] Yes Max daily 70 units Shamleffer, Melanie Crazier, MD  Taking Active   insulin degludec (TRESIBA FLEXTOUCH) 100 UNIT/ML FlexTouch Pen [id number] Yes Inject 48 Units into the skin daily. Please deliver out to patient by 07/25/2022 Carollee Herter, Alferd Apa, DO Taking Active   Lancet Devices (LANCING DEVICE) [place] [id number] Yes  [provider] Taking Active Self  losartan (COZAAR) 100 MG tablet [id number] Yes TAKE ONE TABLET BY MOUTH ONCE DAILY Ann Held, DO Taking Active   metoprolol succinate (TOPROL-XL) 50 MG 24 hr tablet [id number] Yes TAKE ONE TABLET BY MOUTH ONCE DAILY WITH FOOD Ann Held, DO Taking Active   Multiple Vitamin (MULTIVITAMIN WITH MINERALS) TABS [id number] Yes Take 1 tablet by mouth daily. [provider] Taking Active Self  Omega-3 Fatty Acids (FISH OIL) 1000 MG CAPS [id number] Yes Take 1 capsule by mouth in the morning and at bedtime. [provider] Taking Active  Med Note Elana Alm, Melvenia Beam   Fri Oct 14, 2020  9:11 AM)    traMADol (ULTRAM) 50 MG tablet [id number] Yes Take 1 tablet (50 mg total) by mouth 2 (two) times daily as needed. Aundra Dubin, PA-C Taking Active   TRUEplus Lancets 33G MISC [id number] Yes USE AS DIRECTED 4 TIMES A DAY Ann Held, DO Taking Active   vitamin E 400 UNIT capsule [id number] Yes Take 400 Units by mouth daily. [provider] Taking Active Self            Patient Active Problem List   Diagnosis Date Noted   Radiculopathy, lumbar region 05/31/2022   Primary hypertension 09/18/2021   Pain of left calf 09/18/2021   Depression with anxiety 06/21/2021   Diabetes mellitus ([place]) 01/17/2021   Type 2 diabetes mellitus with hyperglycemia, without long-term current use of insulin ([place]) 01/17/2021   Acute pain of right shoulder 08/18/2020   Primary osteoarthritis of right knee 08/18/2020   Impacted cerumen of right ear 05/24/2020   Bilateral impacted cerumen 05/17/2020   Hyperlipidemia associated with type 2 diabetes mellitus ([place])  03/10/2020   Cough 03/10/2020   Seasonal allergies 03/10/2020   Elevated rheumatoid factor 07/21/2018   Gastroenteritis 07/21/2018   Trigger middle finger of left hand 12/19/2017   Ganglion cyst of dorsum of right wrist 12/19/2017   Morton's neuroma of right foot 12/19/2017   Preventative health care 12/13/2017   Pain in both hands 12/13/2017   Hyperlipidemia LDL goal <100 12/13/2017   Abscess of upper gum 12/13/2017   Spinal stenosis of cervical region 09/25/2016   Cervical radiculopathy 09/06/2016   Dyspnea 02/18/2016   Chest pain 02/18/2016   HLD (hyperlipidemia) 02/18/2016   CAD in native artery 02/18/2016   RLS (restless legs syndrome) 02/18/2016   Pancreatitis 02/18/2016   SOB (shortness of breath) 02/18/2016   Uncontrolled type 2 diabetes mellitus with complication    Tobacco abuse    Osteoarthritis of right knee 07/05/2015   Smoking 07/02/2013   Acute upper respiratory infections of unspecified site 07/02/2013   Flu vaccine need 07/02/2013   Multinodular goiter 04/16/2013   Bunion 07/07/2012   BACK PAIN, THORACIC REGION, RIGHT 05/24/2009   IRON DEFICIENCY 03/25/2009   GERD 03/25/2009   WEIGHT LOSS 03/25/2009   SINUSITIS- ACUTE-NOS 11/25/2008   Hyperlipidemia LDL goal <70 10/07/2008   ACUTE PANCREATITIS 09/07/2008   OVARIAN CYST 09/07/2008   KNEE PAIN, RIGHT 05/24/2008   CARPAL TUNNEL SYNDROME, RIGHT 05/12/2008   Acute pain of both shoulders 04/02/2008   ROTATOR CUFF REPAIR, RIGHT, HX OF 03/29/2008   GOITER, MULTINODULAR 12/26/2007   Depression 07/07/2007   HIP PAIN, RIGHT 03/11/2007   RESTLESS LEG SYNDROME, HX OF 03/11/2007   Diabetes mellitus type II, uncontrolled ([place]) 03/07/2007   Essential hypertension 03/07/2007     Medication Assistance:  None required.  Patient affirms current coverage meets needs. Assisted patient earlier in 2023 in applying for LIS.    Assessment / Plan:    Type 2 DM - improving blood glucose since insulin adjusted 06/28/2022 and  patient has been more adherent to recommended low CHO diet.  Updated Rx at pharmacy for Tresiba and requested delivery for this week.  Reviewed insulin dosing and how to use correction (130/25)  Health maintenance:  Reminded patient to get annual flu vaccine and updated COVID vaccine Discussed yearly diabetic eye exam. Patient states she saw ophthalmologist for diabetic eye exam in early 2023 - LM on VM of technician at  Dr Hecker's office - [phone] requesting copy of results of diabetic eye exam.    Follow Up:  Telephone follow up appointment with care management team member scheduled for:  3 months.    Cherre Robins, PharmD Clinical Pharmacist [place] [phone]

## 2022-08-07 ENCOUNTER — Ambulatory Visit (HOSPITAL_BASED_OUTPATIENT_CLINIC_OR_DEPARTMENT_OTHER): Payer: Medicare HMO | Admitting: Family

## 2022-08-13 ENCOUNTER — Ambulatory Visit (HOSPITAL_BASED_OUTPATIENT_CLINIC_OR_DEPARTMENT_OTHER): Payer: Medicare HMO | Admitting: Family

## 2022-08-13 NOTE — Progress Notes (Deleted)
Office Visit    Patient Name: CARLISS PORCARO Date of Encounter: 08/13/2022  PCP:  Ann Held, [facility]  Cardiologist:  Buford Dresser, MD  Advanced Practice Provider:  No care team member to display Electrophysiologist:  None   Chief Complaint    JAYLAA GALLION is a 76 y.o. female presents today for follow up of coronary disease   Past Medical History    Past Medical History:  Diagnosis Date   Blockage of coronary artery of heart ([place])    Depression    Diabetes mellitus    Hyperlipidemia    Hypertension    Pancreatitis    Restless leg syndrome    Past Surgical History:  Procedure Laterality Date   CATARACT EXTRACTION, BILATERAL     CORONARY ANGIOPLASTY WITH STENT PLACEMENT     FOOT SURGERY Right    cyst removal   ROTATOR CUFF REPAIR     right   ROTATOR CUFF REPAIR Left    TUBAL LIGATION      Allergies  Allergies  Allergen Reactions   Metformin And Related Other (See Comments)    Sick on stomach, sweaty, throw up, pain   Levemir [Insulin Detemir] Hives and Swelling   Clindamycin Nausea And Vomiting   Crestor [Rosuvastatin Calcium] Palpitations    History of Present Illness    KYLEY SOLOW is a 76 y.o. female with a hx of CAD, DM 2, hyperlipidemia, hypertension last seen 02/06/22  Prior history of cardiac stents the uncertain where, what type, how many.  Last cath 2001 by Dr. Karlton Lemon.  Echo and Lexi scan ordered in 2017 due to fatigue.  Echo 02/2016 normal LVEF 60 to 16%, grade 1 diastolic dysfunction, mild MR.  Myoview 02/19/2016 with no reversible ischemia or infarction, EF 58%, low risk study.  Seen 05/23/20 by Dr. Harrell Gave doing well. She was seen in clinic 02/2022 and was referred to PREP exercise program.She noted only occasional chest discomfort which resolved with gas tablets and was atypical for angina.   She presents today for follow-up.   Pleasant lady who lives with her husband and  grandchildren who are 63 and 43 years old. Excited for upcoming trip to [place] with her family to celebrate her granddaughter's graduation. Tells me she gets occasional left arm pain which she attributes to gas. It improves with gas tablets. This happens a few times per month and resolves quickly. Overall not bothersome. She has no formal exercise routine, but does do yard work. Interested in more exercise. Reports no significant lightheadedness, dizziness. No dyspnea, orthopnea, PND. Checks her blood pressure twice per week with readings 126/82.   ***  EKGs/Labs/Other Studies Reviewed:   The following studies were reviewed today:   EKG:  EKG is  ordered today.  The ekg ordered today demonstrates SB 55 bpm with LAD no acute ST/T wave changes. ***  Recent Labs: 05/18/2022: ALT 20; BUN 26; Creat 1.19; Hemoglobin 12.1; Platelets 187; Potassium 4.0; Sodium 137  Recent Lipid Panel    Component Value Date/Time   CHOL 136 05/18/2022 1433   TRIG 156 (H) 05/18/2022 1433   HDL 37 (L) 05/18/2022 1433   CHOLHDL 3.7 05/18/2022 1433   VLDL 14.4 12/21/2021 1025   LDLCALC 76 05/18/2022 1433   LDLDIRECT 53.0 04/24/2021 1542   Home Medications   No outpatient medications have been marked as taking for the 08/13/22 encounter (Appointment) with Loel Dubonnet, NP.  Review of Systems     All other systems reviewed and are otherwise negative except as noted above.  Physical Exam    VS:  There were no vitals taken for this visit. , BMI There is no height or weight on file to calculate BMI.  Wt Readings from Last 3 Encounters:  06/28/22 173 lb (78.5 kg)  05/18/22 179 lb 6.4 oz (81.4 kg)  04/02/22 176 lb 6.4 oz (80 kg)    *** GEN: Well nourished, well developed, in no acute distress. HEENT: normal. Neck: Supple, no JVD, carotid bruits, or masses. Cardiac: RRR, no murmurs, rubs, or gallops. No clubbing, cyanosis, edema.  Radials/PT 2+ and equal bilaterally.  Respiratory:  Respirations  regular and unlabored, clear to auscultation bilaterally. GI: Soft, nontender, nondistended. MS: No deformity or atrophy. Skin: Warm and dry, no rash. Neuro:  Strength and sensation are intact. Psych: Normal affect.  Assessment & Plan    CAD - Stable with no anginal symptoms. GDMT includes aspirin, Atorvastatin, Metoprolol. Heart healthy diet and regular cardiovascular exercise encouraged.  ***  DM2 -follows with endocrinology Dr. Lonzo Cloud. ***  HTN -***Continue current antihypertensive regimen Amlodipine 10mg  QD, Losartan 100mg  QD, Toprol 50mg  daily. Heart healthy diet and regular cardiovascular exercise encouraged.  ***  HLD, LDL goal <70 -05/18/22 LDL 76, triglycerides 156. *** Continue Atorvastatin 40mg  daily, Fenofibrate 160mg  daily. Previously declined Zetia. ***   Disposition: Follow up in 6 month(s) *** with , MD or APP.  Signed, , NP 08/13/2022, 1:56 PM [facility]

## 2022-09-06 DIAGNOSIS — E1165 Type 2 diabetes mellitus with hyperglycemia: Secondary | ICD-10-CM | POA: Diagnosis not present

## 2022-09-21 ENCOUNTER — Telehealth: Payer: Self-pay | Admitting: *Deleted

## 2022-09-21 NOTE — Telephone Encounter (Signed)
LMOM for pt to schedule AWV. 

## 2022-10-03 ENCOUNTER — Telehealth: Payer: Self-pay | Admitting: Internal Medicine

## 2022-10-03 NOTE — Telephone Encounter (Signed)
Called patient and she will contact pcp to evaluate her foot bruise.

## 2022-10-03 NOTE — Telephone Encounter (Signed)
Patient is calling to say that she is concerned about some bruising that has appeared on the heel of her right foot.  Patient is requesting an appointment to be seen for it, but she is not sure which doctor to go to- Dr. Lonzo Cloud or her primary care.

## 2022-10-11 ENCOUNTER — Ambulatory Visit: Payer: Medicare HMO | Admitting: Family Medicine

## 2022-10-12 ENCOUNTER — Ambulatory Visit: Payer: Medicare HMO | Admitting: Family Medicine

## 2022-10-13 ENCOUNTER — Other Ambulatory Visit: Payer: Self-pay | Admitting: Family Medicine

## 2022-10-13 DIAGNOSIS — E785 Hyperlipidemia, unspecified: Secondary | ICD-10-CM

## 2022-10-13 DIAGNOSIS — I10 Essential (primary) hypertension: Secondary | ICD-10-CM

## 2022-10-23 ENCOUNTER — Telehealth: Payer: Self-pay | Admitting: Family Medicine

## 2022-10-23 ENCOUNTER — Ambulatory Visit: Payer: Medicare HMO | Admitting: Pharmacist

## 2022-10-23 VITALS — BP 145/85

## 2022-10-23 DIAGNOSIS — F419 Anxiety disorder, unspecified: Secondary | ICD-10-CM

## 2022-10-23 DIAGNOSIS — E785 Hyperlipidemia, unspecified: Secondary | ICD-10-CM

## 2022-10-23 DIAGNOSIS — I10 Essential (primary) hypertension: Secondary | ICD-10-CM

## 2022-10-23 DIAGNOSIS — E1165 Type 2 diabetes mellitus with hyperglycemia: Secondary | ICD-10-CM

## 2022-10-23 NOTE — Progress Notes (Signed)
Pharmacy Note  10/23/2022 Name: [name] MRN: [id number] DOB: 05-18-1946  Subjective: [name] is a 77 y.o. year old female who is a primary care patient of Carollee Herter, Alferd Apa, DO. Clinical Pharmacist Practitioner referral was placed to assist with hypertension, hyperlipidemia diabetes and medication management.    Engaged with patient by telephone for follow up visit today.  Hypertension: patient reports over the last few weeks her blood pressure at home ha been elevated. Usually 140's to 150's for SBP and DBP of 80's. She reported 1 reading of 203/100. She did not contact medical professional but laid down to rest and after 30 minutes blood pressure was lower - around 150's / 80's.  Current medications include: amlodipine 10mg  daily, losartan 100mg  daily and metoprolol ER 50mg  daily.  Patient has failed these meds in the past: lisinopril 40mg  - ineffective   BP Readings from Last 3 Encounters:  10/23/22 (!) 145/85  06/28/22 130/70  05/18/22 (!) 150/70   Diet - patient has been trying to limit intake of salty foods recently Exercise - active but not participating in any formal exercise currently.    Type 2 DM, insulin dependent:  Patient denies blood glucose < 80. Report post prandial blood glucose has improved since increased dose of Tresiba to 48 units daily.  She is using Continuous Glucose Monitor - Dex Com 6 regularly to monitor blood glucose and endorses adherence to diabetes medications.   Current medications for DM: Tresiba 48 units daily, Jardiance 25mg  daily, Novolog 16 units with brunch and 14 units with supper with correction of 130/25 if needed.   Hyperlipidemia: Patient is taking atorvastatin 40mg  daily.  Past medications: rosuvastatin - cause palpitations.   Medication Management:  Patient uses [facility] and adherence packaging. Receives delivery every 3 months. Last received delivery of packaging 08/01/2022 and started pack on  08/08/2022. She will be due refills in about 1 week.  Patient is requesting refill for alprazolam 0.25mg . she only takes on occasion if needed for anxiety. Last refilled at [facility] for #60 tablets on 02/21/2022 - new Rx will be needed.   Objective: Review of patient status, including review of consultants reports, laboratory and other test data, was performed as part of comprehensive evaluation and provision of chronic care management services.   Lab Results  Component Value Date   CREATININE 1.19 (H) 05/18/2022   CREATININE 0.88 12/21/2021   CREATININE 0.75 09/18/2021    Lab Results  Component Value Date   HGBA1C 8.6 (A) 06/28/2022       Component Value Date/Time   CHOL 136 05/18/2022 1433   TRIG 156 (H) 05/18/2022 1433   HDL 37 (L) 05/18/2022 1433   CHOLHDL 3.7 05/18/2022 1433   VLDL 14.4 12/21/2021 1025   LDLCALC 76 05/18/2022 1433   LDLDIRECT 53.0 04/24/2021 1542     Clinical ASCVD: Yes  The 10-year ASCVD risk score (Arnett DK, et al., 2019) is: 25.2%   Values used to calculate the score:     Age: 44 years     Sex: Female     Is Non-Hispanic African American: Yes     Diabetic: Yes     Tobacco smoker: No     Systolic Blood Pressure: 308 mmHg     Is BP treated: Yes     HDL Cholesterol: 37 mg/dL     Total Cholesterol: 136 mg/dL    BP Readings from Last 3 Encounters:  10/23/22 (!) 145/85  06/28/22 130/70  05/18/22 (!) 150/70     Allergies  Allergen Reactions   Metformin And Related Other (See Comments)    Sick on stomach, sweaty, throw up, pain   Levemir [Insulin Detemir] Hives and Swelling   Clindamycin Nausea And Vomiting   Crestor [Rosuvastatin Calcium] Palpitations    Medications Reviewed Today     Reviewed by [name], RPH-CPP (Pharmacist) on 07/23/22 at 1506  Med List Status: <None>   Medication Order Taking? Sig Documenting Provider Last Dose Status Informant  Alcohol Swabs (B-D SINGLE USE SWABS REGULAR) PADS [id number] Yes USE AS DIRECTED  [name], [name], DO Taking Active   alendronate (FOSAMAX) 70 MG tablet [id number] Yes TAKE ONE TABLET BY MOUTH ONCE WEEKLY ON WEDNESDAY Donato Schultz, DO Taking Active   ALPRAZolam Prudy Feeler) 0.25 MG tablet [id number] Yes Take 1 tablet by mouth three times a day if needed Donato Schultz, DO Taking Active   amLODipine (NORVASC) 10 MG tablet [id number] Yes TAKE ONE TABLET BY MOUTH ONCE DAILY Donato Schultz, DO Taking Active   aspirin EC 81 MG tablet [id number] Yes Take 81 mg by mouth daily. [provider] Taking Active Self  atorvastatin (LIPITOR) 40 MG tablet [id number] Yes TAKE ONE TABLET BY MOUTH EVERY EVENING Seabron Spates R, DO Taking Active   Blood Glucose Calibration (TAI DOC CONTROL) NORMAL SOLN [id number] Yes  [provider] Taking Active Self  Blood Glucose Monitoring Suppl (TRUE METRIX AIR GLUCOSE METER) w/Device KIT [id number] Yes 1 each by Does not apply route 4 (four) times daily. DX CODE E11.8 Donato Schultz, DO Taking Active   Carboxymethylcellulose Sodium (THERATEARS OP) [id number] Yes Place 1 drop into both eyes daily as needed (dry eyes). [provider] Taking Active Self  COMFORT EZ PEN NEEDLES 32G X 4 MM MISC [id number] Yes USE AS DIRECTED TWICE DAILY Shamleffer, Konrad Dolores, MD Taking Active   Continuous Blood Gluc Sensor (DEXCOM G6 SENSOR) MISC [id number] Yes 1 Device by Does not apply route as directed. Shamleffer, Konrad Dolores, MD Taking Active   Continuous Blood Gluc Transmit (DEXCOM G6 TRANSMITTER) MISC [id number] Yes 1 Device by Does not apply route as directed. Shamleffer, Konrad Dolores, MD Taking Active   empagliflozin (JARDIANCE) 25 MG TABS tablet [id number] Yes Take 1 tablet (25 mg total) by mouth daily before breakfast. Shamleffer, Konrad Dolores, MD Taking Active   fenofibrate 160 MG tablet [id number] Yes TAKE ONE TABLET BY MOUTH EVERY EVENING Donato Schultz, DO Taking Active   ferrous sulfate 325 (65  FE) MG tablet [id number] Yes Take 325 mg by mouth daily with breakfast.  [provider] Taking Active Self  fexofenadine (ALLEGRA) 180 MG tablet [id number] Yes Take 180 mg by mouth daily as needed for allergies or rhinitis. [provider] Taking Active   Flaxseed, Linseed, (FLAXSEED OIL PO) [id number] Yes Take 1 capsule by mouth 2 (two) times daily.  [provider] Taking Active Self  FLUoxetine (PROZAC) 20 MG capsule [id number] Yes TAKE THREE CAPSULES BY MOUTH ONCE DAILY [name], [name], DO Taking Active   fluticasone [facility]) 50 MCG/ACT nasal spray [id number] Yes PLACE TWO SPRAYS IN [facility] NOSTRIL DAILY Seabron Spates R, DO Taking Active   glucose blood (TRUE METRIX BLOOD GLUCOSE TEST) test strip [id number] Yes USE AS DIRECTED 4 TIMES A DAY Seabron Spates R, DO Taking Active   insulin aspart (NOVOLOG FLEXPEN) 100 UNIT/ML FlexPen [id number] Yes Max daily 70 units Shamleffer, Konrad Dolores, MD  Taking Active   insulin degludec (TRESIBA FLEXTOUCH) 100 UNIT/ML FlexTouch Pen [id number] Yes Inject 48 Units into the skin daily. Please deliver out to patient by 07/25/2022 [name], [name], DO Taking Active   Lancet Devices (LANCING DEVICE) MISC [id number] Yes  [provider] Taking Active Self  losartan (COZAAR) 100 MG tablet [id number] Yes TAKE ONE TABLET BY MOUTH ONCE DAILY Donato Schultz, DO Taking Active   metoprolol succinate (TOPROL-XL) 50 MG 24 hr tablet [id number] Yes TAKE ONE TABLET BY MOUTH ONCE DAILY WITH FOOD Donato Schultz, DO Taking Active   Multiple Vitamin (MULTIVITAMIN WITH MINERALS) TABS [id number] Yes Take 1 tablet by mouth daily. [provider] Taking Active Self  Omega-3 Fatty Acids (FISH OIL) 1000 MG CAPS [id number] Yes Take 1 capsule by mouth in the morning and at bedtime. [provider] Taking Active            Med Note Morrie Sheldon, Reyne Dumas Oct 14, 2020  9:11 AM)    traMADol (ULTRAM) 50 MG tablet [id number]  Yes Take 1 tablet (50 mg total) by mouth 2 (two) times daily as needed. Cristie Hem, PA-C Taking Active   TRUEplus Lancets 33G MISC [id number] Yes USE AS DIRECTED 4 TIMES A DAY Donato Schultz, DO Taking Active   vitamin E 400 UNIT capsule [id number] Yes Take 400 Units by mouth daily. [provider] Taking Active Self            Patient Active Problem List   Diagnosis Date Noted   Radiculopathy, lumbar region 05/31/2022   Primary hypertension 09/18/2021   Pain of left calf 09/18/2021   Depression with anxiety 06/21/2021   Diabetes mellitus (HCC) 01/17/2021   Type 2 diabetes mellitus with hyperglycemia, without long-term current use of insulin (HCC) 01/17/2021   Acute pain of right shoulder 08/18/2020   Primary osteoarthritis of right knee 08/18/2020   Impacted cerumen of right ear 05/24/2020   Bilateral impacted cerumen 05/17/2020   Hyperlipidemia associated with type 2 diabetes mellitus (HCC) 03/10/2020   Cough 03/10/2020   Seasonal allergies 03/10/2020   Elevated rheumatoid factor 07/21/2018   Gastroenteritis 07/21/2018   Trigger middle finger of left hand 12/19/2017   Ganglion cyst of dorsum of right wrist 12/19/2017   Morton's neuroma of right foot 12/19/2017   Preventative health care 12/13/2017   Pain in both hands 12/13/2017   Hyperlipidemia LDL goal <100 12/13/2017   Abscess of upper gum 12/13/2017   Spinal stenosis of cervical region 09/25/2016   Cervical radiculopathy 09/06/2016   Dyspnea 02/18/2016   Chest pain 02/18/2016   HLD (hyperlipidemia) 02/18/2016   CAD in native artery 02/18/2016   RLS (restless legs syndrome) 02/18/2016   Pancreatitis 02/18/2016   SOB (shortness of breath) 02/18/2016   Uncontrolled type 2 diabetes mellitus with complication    Tobacco abuse    Osteoarthritis of right knee 07/05/2015   Smoking 07/02/2013   Acute upper respiratory infections of unspecified site 07/02/2013   Flu vaccine need 07/02/2013    Multinodular goiter 04/16/2013   Bunion 07/07/2012   BACK PAIN, THORACIC REGION, RIGHT 05/24/2009   IRON DEFICIENCY 03/25/2009   GERD 03/25/2009   WEIGHT LOSS 03/25/2009   SINUSITIS- ACUTE-NOS 11/25/2008   Hyperlipidemia LDL goal <70 10/07/2008   ACUTE PANCREATITIS 09/07/2008   OVARIAN CYST 09/07/2008   KNEE PAIN, RIGHT 05/24/2008   CARPAL TUNNEL SYNDROME, RIGHT 05/12/2008   Acute pain of both shoulders 04/02/2008   ROTATOR  CUFF REPAIR, RIGHT, HX OF 03/29/2008   GOITER, MULTINODULAR 12/26/2007   Depression 07/07/2007   HIP PAIN, RIGHT 03/11/2007   RESTLESS LEG SYNDROME, HX OF 03/11/2007   Diabetes mellitus type II, uncontrolled ([place]) 03/07/2007   Essential hypertension 03/07/2007     Medication Assistance:  None required.  Patient affirms current coverage meets needs. Assited with [id number] application for LIS. Confirmed today that patient was approved for LIS for 2024 as well.    Assessment / Plan:    Type 2 DM - Home blood glucose improved since last A1c checked. Patient to see endocrinologist in 3 weeks Continue current therapy for diabetes.  Continue to use DexCom 6 to continuously check blood glucose. Reviewed Novolog insulin dosing and how to use correction (130/25)  Hypertension: home blood pressure has not been at goal Change losartan to valsartan 320mg  daily (will change with next packaging to start around 11/08/2022) Due to see PCP in 4 weeks - will recheck blood pressure and adjust meds as needed. If blood pressure not at goal in future can consider increasing metoprolol to 100mg  daily or add hydrochlorothiazide.  Discussed limiting sodium intake and reviewed food with high sodium to avoid / limit. Also discussed increasing high potassium foods to help with blood pressure (0.5 of banana, strawberries, blueberries, lentils, beans, green leafy vegetable - kale and spinach)  Encouraged her to start daily exercise - walking is great, start with 5 to 10 minutes per day and work  up as able to goal of 150 minutes per week.   Hyperlipidemia: LDL not at goal of < 70 Continue atorvastatin 40mg  daily. Due to have lipids rechecked in a few weeks when she sees PCP. If LDL still > 70, then consider either increasing atorvastatin to 80mg  daily or add ezetimibe 10mg  daily.  Daily exercise will help with cholesterol as well.   Medication management:  Coordinated with pharmacy regarding upcoming packaging changes and delivery.  Will send request to PCP regarding RF for alprazolam.   Follow Up:  Telephone follow up appointment with care management team member scheduled for:  3 months.    Cherre Robins, PharmD Clinical Pharmacist [place] [place] [facility] [phone]

## 2022-10-23 NOTE — Telephone Encounter (Signed)
Copied from [place] [id number]. Topic: Medicare AWV >> Oct 23, 2022  1:07 PM Gillis Santa wrote: Reason for CRM     LVM REMINDING PATIENT TO BE EXPECTING TELE VISIT FROM TINA FOR HER AWV 10/24/2022 7:45

## 2022-10-24 ENCOUNTER — Other Ambulatory Visit: Payer: Self-pay | Admitting: Pharmacist

## 2022-10-24 ENCOUNTER — Ambulatory Visit (INDEPENDENT_AMBULATORY_CARE_PROVIDER_SITE_OTHER): Payer: Medicare HMO

## 2022-10-24 VITALS — Wt 173.0 lb

## 2022-10-24 DIAGNOSIS — Z0000 Encounter for general adult medical examination without abnormal findings: Secondary | ICD-10-CM | POA: Diagnosis not present

## 2022-10-24 DIAGNOSIS — F419 Anxiety disorder, unspecified: Secondary | ICD-10-CM

## 2022-10-24 MED ORDER — ALPRAZOLAM 0.25 MG PO TABS: ORAL_TABLET | ORAL | 0 refills | Status: DC

## 2022-10-24 NOTE — Patient Instructions (Addendum)
Blood pressure goal is < 130/80.   Check your blood pressure  2 to 3 times per wee , and any time you have concerning symptoms like headache, chest pain, dizziness, shortness of breath, or vision changes.   To appropriately check your blood pressure, make sure you do the following:  1) Avoid caffeine, exercise, or tobacco products for 30 minutes before checking. Empty your bladder. 2) Sit with your back supported in a flat-backed chair. Rest your arm on something flat (arm of the chair, table, etc). 3) Sit still with your feet flat on the floor, resting, for at least 5 minutes.  4) Check your blood pressure. Take 1-2 readings.  5) Write down these readings and bring with you to any provider appointments.  Bring your home blood pressure machine with you to a provider's office for accuracy comparison at least once a year.   Make sure you take your blood pressure medications before you come to any office visit, even if you were asked to fast for labs.

## 2022-10-24 NOTE — Telephone Encounter (Signed)
Patient requesting refill  Medication: alprazolam 0.25mg  Directions: takes up to three times a day as needed.  Pharmacy: [facility] Last RF: 02/21/2022 Quantity: 60

## 2022-10-24 NOTE — Progress Notes (Addendum)
I connected with  [name] on 10/24/22 by a audio enabled telemedicine application and verified that I am speaking with the correct person using two identifiers.  Patient Location: Home  Provider Location: Office/Clinic  I discussed the limitations of evaluation and management by telemedicine. The patient expressed understanding and agreed to proceed.   Subjective:   [name] is a 77 y.o. female who presents for Medicare Annual (Subsequent) preventive examination.  Review of Systems     Cardiac Risk Factors include: advanced age (>37men, >3 women);hypertension;dyslipidemia;diabetes mellitus     Objective:    Today's Vitals   10/24/22 0802  Weight: 173 lb (78.5 kg)   Body mass index is 28.79 kg/m.     10/24/2022    8:11 AM 11/11/2021    1:20 PM 05/04/2021    3:58 PM 09/23/2020    2:31 PM 05/12/2020   10:22 AM 05/11/2019    1:11 PM 04/22/2019    2:46 PM  Advanced Directives  Does Patient Have a Medical Advance Directive? No No No No No No No  Would patient like information on creating a medical advance directive? No - Patient declined   No - Patient declined No - Patient declined No - Patient declined     Current Medications (verified) Outpatient Encounter Medications as of 10/24/2022  Medication Sig   Alcohol Swabs (B-D SINGLE USE SWABS REGULAR) PADS USE AS DIRECTED   alendronate (FOSAMAX) 70 MG tablet TAKE ONE TABLET BY MOUTH ONCE WEEKLY ON WEDNESDAY   ALPRAZolam (XANAX) 0.25 MG tablet Take 1 tablet by mouth three times a day if needed   amLODipine (NORVASC) 10 MG tablet TAKE ONE TABLET BY MOUTH ONCE DAILY   aspirin EC 81 MG tablet Take 81 mg by mouth daily.   atorvastatin (LIPITOR) 40 MG tablet TAKE ONE TABLET BY MOUTH EVERY EVENING   Carboxymethylcellulose Sodium (THERATEARS OP) Place 1 drop into both eyes daily as needed (dry eyes).   COMFORT EZ PEN NEEDLES 32G X 4 MM MISC USE AS DIRECTED TWICE DAILY   empagliflozin (JARDIANCE) 25 MG TABS tablet Take 1  tablet (25 mg total) by mouth daily before breakfast.   fenofibrate 160 MG tablet TAKE ONE TABLET BY MOUTH EVERY EVENING   ferrous sulfate 325 (65 FE) MG tablet Take 325 mg by mouth daily with breakfast.    fexofenadine (ALLEGRA) 180 MG tablet Take 180 mg by mouth daily as needed for allergies or rhinitis.   FLUoxetine (PROZAC) 20 MG capsule TAKE THREE CAPSULES BY MOUTH ONCE DAILY   fluticasone (FLONASE) 50 MCG/ACT nasal spray PLACE TWO SPRAYS IN EACH NOSTRIL DAILY   glucose blood (TRUE METRIX BLOOD GLUCOSE TEST) test strip USE AS DIRECTED 4 TIMES A DAY   insulin aspart (NOVOLOG FLEXPEN) 100 UNIT/ML FlexPen Max daily 70 units   insulin degludec (TRESIBA FLEXTOUCH) 100 UNIT/ML FlexTouch Pen Inject 48 Units into the skin daily. Please deliver out to patient by 07/25/2022   losartan (COZAAR) 100 MG tablet TAKE ONE TABLET BY MOUTH ONCE DAILY   metoprolol succinate (TOPROL-XL) 50 MG 24 hr tablet TAKE ONE TABLET BY MOUTH ONCE DAILY WITH FOOD   Multiple Vitamin (MULTIVITAMIN WITH MINERALS) TABS Take 1 tablet by mouth daily.   Omega-3 Fatty Acids (FISH OIL) 1000 MG CAPS Take 1 capsule by mouth in the morning and at bedtime.   vitamin E 400 UNIT capsule Take 400 Units by mouth daily.   Blood Glucose Calibration (TAI DOC CONTROL) NORMAL SOLN  (Patient not taking:  Reported on 10/23/2022)   Blood Glucose Monitoring Suppl (TRUE METRIX AIR GLUCOSE METER) w/Device KIT 1 each by Does not apply route 4 (four) times daily. DX CODE E11.8 (Patient not taking: Reported on 10/23/2022)   Continuous Blood Gluc Sensor (DEXCOM G6 SENSOR) MISC 1 Device by Does not apply route as directed.   Continuous Blood Gluc Transmit (DEXCOM G6 TRANSMITTER) MISC 1 Device by Does not apply route as directed. (Patient not taking: Reported on 10/24/2022)   Lancet Devices (LANCING DEVICE) MISC  (Patient not taking: Reported on 10/23/2022)   TRUEplus Lancets 33G MISC USE AS DIRECTED 4 TIMES A DAY (Patient not taking: Reported on 10/23/2022)    [DISCONTINUED] Flaxseed, Linseed, (FLAXSEED OIL PO) Take 1 capsule by mouth 2 (two) times daily.    [DISCONTINUED] traMADol (ULTRAM) 50 MG tablet Take 1 tablet (50 mg total) by mouth 2 (two) times daily as needed. (Patient not taking: Reported on 10/23/2022)   No facility-administered encounter medications on file as of 10/24/2022.    Allergies (verified) Metformin and related, Levemir [insulin detemir], Clindamycin, and Crestor [rosuvastatin calcium]   History: Past Medical History:  Diagnosis Date   Blockage of coronary artery of heart (HCC)    Depression    Diabetes mellitus    Hyperlipidemia    Hypertension    Pancreatitis    Restless leg syndrome    Past Surgical History:  Procedure Laterality Date   CATARACT EXTRACTION, BILATERAL     CORONARY ANGIOPLASTY WITH STENT PLACEMENT     FOOT SURGERY Right    cyst removal   ROTATOR CUFF REPAIR     right   ROTATOR CUFF REPAIR Left    TUBAL LIGATION     Family History  Problem Relation Age of Onset   Heart attack Father    Heart attack Brother    Diabetes Sister    Cirrhosis Sister    Hypertension Sister    Cancer Mother    Hypertension Mother    Vision loss Mother    Cancer Sister    Sleep apnea Sister    Heart Problems Sister    Diabetes Sister    Gout Sister    Diabetes Son    Heart attack Son    Diabetes Daughter    Goiter Daughter    Diabetes Daughter    Social History   Socioeconomic History   Marital status: Married    Spouse name: Not on file   Number of children: Not on file   Years of education: Not on file   Highest education level: Not on file  Occupational History   Not on file  Tobacco Use   Smoking status: Former    Packs/day: 0.20    Types: Cigarettes    Quit date: 05/05/2020    Years since quitting: 2.4   Smokeless tobacco: Never   Tobacco comments:    quit in 2021  Vaping Use   Vaping Use: Never used  Substance and Sexual Activity   Alcohol use: No   Drug use: No   Sexual activity:  Not Currently  Other Topics Concern   Not on file  Social History Narrative   Exercise: seldom   Caffeine use: seldom   Social Determinants of Health   Financial Resource Strain: Low Risk  (10/24/2022)   Overall Financial Resource Strain (CARDIA)    Difficulty of Paying Living Expenses: Not hard at all  Recent Concern: Financial Resource Strain - Medium Risk (10/23/2022)   Overall Financial Resource Strain (CARDIA)  Difficulty of Paying Living Expenses: Somewhat hard  Food Insecurity: No Food Insecurity (10/24/2022)   Hunger Vital Sign    Worried About Running Out of Food in the Last Year: Never true    Ran Out of Food in the Last Year: Never true  Transportation Needs: No Transportation Needs (10/24/2022)   PRAPARE - Administrator, Civil Service (Medical): No    Lack of Transportation (Non-Medical): No  Physical Activity: Inactive (10/24/2022)   Exercise Vital Sign    Days of Exercise per Week: 0 days    Minutes of Exercise per Session: 0 min  Stress: No Stress Concern Present (10/24/2022)   [employer] of Occupational Health - Occupational Stress Questionnaire    Feeling of Stress : Not at all  Social Connections: Moderately Integrated (10/24/2022)   Social Connection and Isolation Panel [NHANES]    Frequency of Communication with Friends and Family: More than three times a week    Frequency of Social Gatherings with Friends and Family: More than three times a week    Attends Religious Services: Never    Database administrator or Organizations: Yes    Attends Banker Meetings: 1 to 4 times per year    Marital Status: Married    Tobacco Counseling Counseling given: Not Answered Tobacco comments: quit in 2021   Clinical Intake:  Pre-visit preparation completed: Yes  Pain : No/denies pain     BMI - recorded: 28.79 Nutritional Status: BMI 25 -29 Overweight Nutritional Risks: None Diabetes: Yes CBG done?: No Did pt. bring in CBG monitor  from home?: No  How often do you need to have someone help you when you read instructions, pamphlets, or other written materials from your doctor or pharmacy?: 1 - Never  Diabetic?Nutrition Risk Assessment:  Has the patient had any N/V/D within the last 2 months?  No  Does the patient have any non-healing wounds?  No  Has the patient had any unintentional weight loss or weight gain?  No   Diabetes:  Is the patient diabetic?  Yes  If diabetic, was a CBG obtained today?  No  Did the patient bring in their glucometer from home?  No  How often do you monitor your CBG's? Every other day .   Financial Strains and Diabetes Management:  Are you having any financial strains with the device, your supplies or your medication? No .  Does the patient want to be seen by Chronic Care Management for management of their diabetes?  No  Would the patient like to be referred to a Nutritionist or for Diabetic Management?  No   Diabetic Exams:  Diabetic Eye Exam: Completed 07/17/22 Diabetic Foot Exam: Overdue, Pt has been advised about the importance in completing this exam. Pt is scheduled for diabetic foot exam on next appt .  Interpreter Needed?: No  Information entered by :: Lanier Ensign, LPN   Activities of Daily Living    10/24/2022    8:11 AM  In your present state of health, do you have any difficulty performing the following activities:  Hearing? 1  Vision? 0  Difficulty concentrating or making decisions? 0  Walking or climbing stairs? 0  Dressing or bathing? 0  Doing errands, shopping? 0  Preparing Food and eating ? N  Using the Toilet? N  In the past six months, have you accidently leaked urine? Y  Comment related to medication per pt  Do you have problems with loss of bowel control?  N  Managing your Medications? N  Managing your Finances? N  Housekeeping or managing your Housekeeping? N    Patient Care Team: Carollee Herter, Alferd Apa, DO as PCP - General Buford Dresser, MD as PCP - Cardiology (Cardiology) Ashok Pall, MD as Consulting Physician (Neurosurgery) Monna Fam, MD as Consulting Physician (Ophthalmology) Cherre Robins, RPH-CPP (Pharmacist) [facility], Melanie Crazier, MD as Consulting Physician (Endocrinology)  Indicate any recent Medical Services you may have received from other than [facility] providers in the past year (date may be approximate).     Assessment:   This is a routine wellness examination for Hood.  Hearing/Vision screen Hearing Screening - Comments:: Pt stated HOH  Vision Screening - Comments:: Pt follows up with Dr Herbert Deaner for annual eye exams   Dietary issues and exercise activities discussed: Current Exercise Habits: The patient does not participate in regular exercise at present   Goals Addressed             This Visit's Progress    Patient Stated       Cut back on salt and pork        Depression Screen    10/24/2022    8:07 AM 05/18/2022    2:07 PM 10/14/2020    8:54 AM 09/13/2020   10:41 AM 06/07/2020    9:51 AM 05/12/2020   10:27 AM 05/11/2019    1:11 PM  PHQ 2/9 Scores  PHQ - 2 Score 0 2 6 6 3  0 0  PHQ- 9 Score  7 16 16 7       Fall Risk    10/24/2022    8:11 AM 05/18/2022    2:07 PM 06/20/2021    3:31 PM 05/12/2020   10:27 AM 05/11/2019    1:11 PM  [place] in the past year? 0 0 0 0 0  Number falls in past yr: 0 0 0 0   Injury with Fall? 0 0 0 0   Risk for fall due to : Impaired balance/gait      Follow up Falls prevention discussed Falls evaluation completed Falls evaluation completed Education provided;Falls prevention discussed     FALL RISK PREVENTION PERTAINING TO THE HOME:  Any stairs in or around the home? No  If so, are there any without handrails? No  Home free of loose throw rugs in walkways, pet beds, electrical cords, etc? Yes  Adequate lighting in your home to reduce risk of falls? Yes   ASSISTIVE DEVICES UTILIZED TO PREVENT FALLS:  Life alert? No  Use of a  cane, walker or w/c? No  Grab bars in the bathroom? Yes  Shower chair or bench in shower? No  Elevated toilet seat or a handicapped toilet? No   TIMED UP AND GO:  Was the test performed? No .    Cognitive Function:    04/01/2017    9:12 AM  MMSE - Mini Mental State Exam  Orientation to time 5  Orientation to Place 5  Registration 3  Attention/ Calculation 5  Recall 2  Language- name 2 objects 2  Language- repeat 1  Language- follow 3 step command 3  Language- read & follow direction 1  Write a sentence 1  Copy design 1  Total score 29        10/24/2022    8:12 AM 05/11/2019    1:17 PM  6CIT Screen  What Year? 0 points 0 points  What month? 0 points 0 points  What time?  0 points 0 points  Count back from 20 0 points 0 points  Months in reverse 0 points 0 points  Repeat phrase 0 points 0 points  Total Score 0 points 0 points    Immunizations Immunization History  Administered Date(s) Administered   Fluad Quad(high Dose 65+) 07/28/2019, 06/20/2021   Influenza Split 08/08/2012   Influenza, High Dose Seasonal PF 08/09/2014, 07/05/2015, 07/21/2018   PFIZER(Purple Top)SARS-COV-2 Vaccination 12/07/2019, 01/04/2020   PPD Test 08/27/2011, 09/04/2011, 07/13/2014, 09/23/2015   Pneumococcal Conjugate-13 08/09/2014   Pneumococcal Polysaccharide-23 11/17/2013   Td 03/11/2007   Zoster, Live 03/11/2007    TDAP status: Due, Education has been provided regarding the importance of this vaccine. Advised may receive this vaccine at local pharmacy or Health Dept. Aware to provide a copy of the vaccination record if obtained from local pharmacy or Health Dept. Verbalized acceptance and understanding.  Flu Vaccine status: Due, Education has been provided regarding the importance of this vaccine. Advised may receive this vaccine at local pharmacy or Health Dept. Aware to provide a copy of the vaccination record if obtained from local pharmacy or Health Dept. Verbalized acceptance and  understanding.  Pneumococcal vaccine status: Up to date  Covid-19 vaccine status: Completed vaccines  Qualifies for Shingles Vaccine? Yes   Zostavax completed No   Shingrix Completed?: No.    Education has been provided regarding the importance of this vaccine. Patient has been advised to call insurance company to determine out of pocket expense if they have not yet received this vaccine. Advised may also receive vaccine at local pharmacy or Health Dept. Verbalized acceptance and understanding.  Screening Tests Health Maintenance  Topic Date Due   Zoster Vaccines- Shingrix (1 of 2) Never done   DTaP/Tdap/Td (2 - Tdap) 03/10/2017   INFLUENZA VACCINE  05/08/2022   COVID-19 Vaccine (3 - 2023-24 season) 06/08/2022   FOOT EXAM  06/13/2022   Diabetic kidney evaluation - Urine ACR  12/22/2022   HEMOGLOBIN A1C  12/27/2022   Diabetic kidney evaluation - eGFR measurement  05/19/2023   OPHTHALMOLOGY EXAM  07/18/2023   Medicare Annual Wellness (AWV)  10/25/2023   Pneumonia Vaccine 67+ Years old  Completed   DEXA SCAN  Completed   Hepatitis C Screening  Completed   HPV VACCINES  Aged Out   Fecal DNA (Cologuard)  Discontinued    Health Maintenance  Health Maintenance Due  Topic Date Due   Zoster Vaccines- Shingrix (1 of 2) Never done   DTaP/Tdap/Td (2 - Tdap) 03/10/2017   INFLUENZA VACCINE  05/08/2022   COVID-19 Vaccine (3 - 2023-24 season) 06/08/2022   FOOT EXAM  06/13/2022    Colorectal cancer screening: No longer required.   Mammogram status: Completed 08/19/19. Repeat every year  Bone Density status: Completed 05/29/17. Results reflect: Bone density results: OSTEOPOROSIS. Repeat every 2 years.   Additional Screening:  Hepatitis C Screening: Completed 10/21/18  Vision Screening: Recommended annual ophthalmology exams for early detection of glaucoma and other disorders of the eye. Is the patient up to date with their annual eye exam?  Yes  Who is the provider or what is the  name of the office in which the patient attends annual eye exams? [facility]  If pt is not established with a provider, would they like to be referred to a provider to establish care? No .   Dental Screening: Recommended annual dental exams for proper oral hygiene  Community Resource Referral / Chronic Care Management: CRR required this visit?  No  CCM required this visit?  No      Plan:     I have personally reviewed and noted the following in the patient's chart:   Medical and social history Use of alcohol, tobacco or illicit drugs  Current medications and supplements including opioid prescriptions. Patient is not currently taking opioid prescriptions. Functional ability and status Nutritional status Physical activity Advanced directives List of other physicians Hospitalizations, surgeries, and ER visits in previous 12 months Vitals Screenings to include cognitive, depression, and falls Referrals and appointments  In addition, I have reviewed and discussed with patient certain preventive protocols, quality metrics, and best practice recommendations. A written personalized care plan for preventive services as well as general preventive health recommendations were provided to patient.     Willette Brace, LPN   [id number]  Nurse Notes: none    I have reviewed and agree with [facility] Co[facility] documentation.  Kathlene November, MD

## 2022-10-24 NOTE — Patient Instructions (Signed)
[name] , Thank you for taking time to come for your Medicare Wellness Visit. I appreciate your ongoing commitment to your health goals. Please review the following plan we discussed and let me know if I can assist you in the future.   These are the goals we discussed:  Goals      Chronic Care Management Pharmacy Care Plan     CARE PLAN ENTRY    Hypertension BP Readings from Last 3 Encounters:  03/06/22 (!) 124/52  02/22/22 (!) 146/78  02/06/22 (!) 144/70  Pharmacist Clinical Goal(s): Over the next 90 days, patient will work with PharmD and providers to maintain BP goal <130/80 Current regimen:  Amlodipine 10mg  daily Losartan 100mg  daily Metoprolol Succinate 50mg  daily Interventions: Requested patient to check her blood pressure 2 times per week and record Patient self care activities - Over the next 90 days, patient will: Ensure daily salt intake < 2300 mg/day Continue current regimen for blood pressure Check blood pressure 2 to 3 times per week and record Take usual morning blood pressure medications prior to in office appointments in future.  Hyperlipidemia / Heart Disease Lab Results  Component Value Date/Time   LDLCALC 81 12/21/2021 10:25 AM   LDLCALC 61 06/15/2020 08:19 AM   LDLDIRECT 53.0 04/24/2021 03:42 PM  Pharmacist Clinical Goal(s): Over the next 90 days, patient will work with PharmD and providers to maintain LDL goal < 70 Current regimen:  Aspirin 81mg  daily Fenofibrate 160mg  daily Omega 3 fish Oil 1030mg  twice daily Atorvastatin 40mg  daily Interventions: Continue current regimen for heart health and cholesterol Continue to follow low fat diet  Diabetes Lab Results  Component Value Date/Time   HGBA1C 7.9 (A) 12/13/2021 10:06 AM   HGBA1C 9.0 (A) 09/13/2021 09:48 AM   HGBA1C 8.4 (H) 04/24/2021 03:42 PM   HGBA1C 8.4 (H) 02/16/2021 09:37 AM  Pharmacist Clinical Goal(s): Over the next 180 days, patient will work with PharmD and providers to achieve  A1c goal <7% Current regimen:  Treiba 40 units daily Novolog - 12 units plus additional correction based on scale from Dr Jardiance 25mg  - take 1 tablet each morning Interventions: Discussed DM goals Fasting (before meal blood sugar): 80-130 Post-Prandial (2 hrs after eating blood sugar): less than 180 A1c (3 month blood sugar average): less than 7.5% Continue to check blood glucose frequently throughout the day Contact provider with any episodes of hypoglycemia (low blood glucose) < 70 or hyperglyemia (high blood glucose) > 220.   Depression / anxiety Pharmacist Clinical Goal(s) Over the next 90 days, patient will work with PharmD and providers to reduce symptoms associated with depression Current regimen:  Alprazolam 0.25mg  three times daily as needed  Fluoxetine 60mg  daily - take 3 capsules of 20mg  to equal 60mg  daily Interventions: None today Patient self care activities - Over the next 90 days, patient will: Continue current regimen  Osteoporosis Pharmacist Clinical Goal(s) Over the next 180 days, patient will work with PharmD and providers to reduce risk of fracture due to osteoporosis  Current regimen:  Alendronate 70mg  weekly Patient self care activities - Over the next 180 days, patient will: Consider repeat DEXA Scan  Shoulder Pain:  Pharmacist Clinical Goal(s) Over the next 180 days, patient will work with PharmD and providers to improve pain and improve BG control to A1c <8.0 so patient can be scheduled for needed shoulder surgery Current regimen:  Tramadol 50mg  up to 3 times a day if needed for pain Interventions: Discuss importance of taking diabetes  meds daily and getting A1c <8.0 so surgery can be scheduled.  Patient self care activities - Over the next 180 days, patient will: Continue to follow up with orthopedic office for pain management   Medication management Pharmacist Clinical Goal(s): Over the next 90 days, patient will work with PharmD  and providers to maintain optimal medication adherence Current pharmacy: [facility] switched [facility] Interventions Comprehensive medication review performed. Utilize [facility] for medication synchronization, packaging and delivery Patient self care activities - Over the next 90 days, patient will: Focus on medication adherence by filling and taking medications appropriately  Take medications as prescribed Report any questions or concerns to PharmD and/or provider(s)   Patient Goals/Self-Care Activities take medications as prescribed Continue current regimen. Reviewed how to determine if she need additional Novolog for correcting high blood glucose - goal blood glucose is 130. She will take current blood glucose and subtract 130. Then that number divide by 25 to get needed extra Novolog units. (Current glucose - 130) / 25 = number of extra units to give to correct high blood glucose  check glucose with CGM, document, and provide at future appointments, and  collaborate with provider on medication access solutions Check blood pressure 2 to 3 times per week and record Take usual morning blood pressure medications prior to in office appointments in future.   Please see past updates related to this goal by clicking on the "Past Updates" button in the selected goal       Increase physical activity     Start Silver Sneakers.     Patient Stated     Cut back on salt and pork      Quit smoking / using tobacco        This is a list of the screening recommended for you and due dates:  Health Maintenance  Topic Date Due   Zoster (Shingles) Vaccine (1 of 2) Never done   DTaP/Tdap/Td vaccine (2 - Tdap) 03/10/2017   Flu Shot  05/08/2022   COVID-19 Vaccine (3 - 2023-24 season) 06/08/2022   Complete foot exam   06/13/2022   Yearly kidney health urinalysis for diabetes  12/22/2022   Hemoglobin A1C  12/27/2022   Yearly kidney function blood test for diabetes  05/19/2023   Eye exam  for diabetics  07/18/2023   Medicare Annual Wellness Visit  10/25/2023   Pneumonia Vaccine  Completed   DEXA scan (bone density measurement)  Completed   Hepatitis C Screening: USPSTF Recommendation to screen - Ages 83-79 yo.  Completed   HPV Vaccine  Aged Out   Cologuard (Stool DNA test)  Discontinued    Advanced directives: Advance directive discussed with you today. Even though you declined this today please call our office should you change your mind and we can give you the proper paperwork for you to fill out.  Conditions/risks identified: cut down on salt and pork   Next appointment: Follow up in one year for your annual wellness visit    Preventive Care 65 Years and Older, Female Preventive care refers to lifestyle choices and visits with your health care provider that can promote health and wellness. What does preventive care include? A yearly physical exam. This is also called an annual well check. Dental exams once or twice a year. Routine eye exams. Ask your health care provider how often you should have your eyes checked. Personal lifestyle choices, including: Daily care of your teeth and gums. Regular physical activity. Eating a healthy diet. Avoiding  tobacco and drug use. Limiting alcohol use. Practicing safe sex. Taking low-dose aspirin every day. Taking vitamin and mineral supplements as recommended by your health care provider. What happens during an annual well check? The services and screenings done by your health care provider during your annual well check will depend on your age, overall health, lifestyle risk factors, and family history of disease. Counseling  Your health care provider may ask you questions about your: Alcohol use. Tobacco use. Drug use. Emotional well-being. Home and relationship well-being. Sexual activity. Eating habits. History of falls. Memory and ability to understand (cognition). Work and work Statistician. Reproductive  health. Screening  You may have the following tests or measurements: Height, weight, and BMI. Blood pressure. Lipid and cholesterol levels. These may be checked every 5 years, or more frequently if you are over 33 years old. Skin check. Lung cancer screening. You may have this screening every year starting at age 49 if you have a 30-pack-year history of smoking and currently smoke or have quit within the past 15 years. Fecal occult blood test (FOBT) of the stool. You may have this test every year starting at age 33. Flexible sigmoidoscopy or colonoscopy. You may have a sigmoidoscopy every 5 years or a colonoscopy every 10 years starting at age 72. Hepatitis C blood test. Hepatitis B blood test. Sexually transmitted disease (STD) testing. Diabetes screening. This is done by checking your blood sugar (glucose) after you have not eaten for a while (fasting). You may have this done every 1-3 years. Bone density scan. This is done to screen for osteoporosis. You may have this done starting at age 74. Mammogram. This may be done every 1-2 years. Talk to your health care provider about how often you should have regular mammograms. Talk with your health care provider about your test results, treatment options, and if necessary, the need for more tests. Vaccines  Your health care provider may recommend certain vaccines, such as: Influenza vaccine. This is recommended every year. Tetanus, diphtheria, and acellular pertussis (Tdap, Td) vaccine. You may need a Td booster every 10 years. Zoster vaccine. You may need this after age 79. Pneumococcal 13-valent conjugate (PCV13) vaccine. One dose is recommended after age 6. Pneumococcal polysaccharide (PPSV23) vaccine. One dose is recommended after age 58. Talk to your health care provider about which screenings and vaccines you need and how often you need them. This information is not intended to replace advice given to you by your health care provider.  Make sure you discuss any questions you have with your health care provider. Document Released: 10/21/2015 Document Revised: 06/13/2016 Document Reviewed: 07/26/2015 Elsevier Interactive Patient Education  2017 [place] Prevention in the Home Falls can cause injuries. They can happen to people of all ages. There are many things you can do to make your home safe and to help prevent falls. What can I do on the outside of my home? Regularly fix the edges of walkways and driveways and fix any cracks. Remove anything that might make you trip as you walk through a door, such as a raised step or threshold. Trim any bushes or trees on the path to your home. Use bright outdoor lighting. Clear any walking paths of anything that might make someone trip, such as rocks or tools. Regularly check to see if handrails are loose or broken. Make sure that both sides of any steps have handrails. Any raised decks and porches should have guardrails on the edges. Have any leaves, snow,  or ice cleared regularly. Use sand or salt on walking paths during winter. Clean up any spills in your garage right away. This includes oil or grease spills. What can I do in the bathroom? Use night lights. Install grab bars by the toilet and in the tub and shower. Do not use towel bars as grab bars. Use non-skid mats or decals in the tub or shower. If you need to sit down in the shower, use a plastic, non-slip stool. Keep the floor dry. Clean up any water that spills on the floor as soon as it happens. Remove soap buildup in the tub or shower regularly. Attach bath mats securely with double-sided non-slip rug tape. Do not have throw rugs and other things on the floor that can make you trip. What can I do in the bedroom? Use night lights. Make sure that you have a light by your bed that is easy to reach. Do not use any sheets or blankets that are too big for your bed. They should not hang down onto the floor. Have a  firm chair that has side arms. You can use this for support while you get dressed. Do not have throw rugs and other things on the floor that can make you trip. What can I do in the kitchen? Clean up any spills right away. Avoid walking on wet floors. Keep items that you use a lot in easy-to-reach places. If you need to reach something above you, use a strong step stool that has a grab bar. Keep electrical cords out of the way. Do not use floor polish or wax that makes floors slippery. If you must use wax, use non-skid floor wax. Do not have throw rugs and other things on the floor that can make you trip. What can I do with my stairs? Do not leave any items on the stairs. Make sure that there are handrails on both sides of the stairs and use them. Fix handrails that are broken or loose. Make sure that handrails are as long as the stairways. Check any carpeting to make sure that it is firmly attached to the stairs. Fix any carpet that is loose or worn. Avoid having throw rugs at the top or bottom of the stairs. If you do have throw rugs, attach them to the floor with carpet tape. Make sure that you have a light switch at the top of the stairs and the bottom of the stairs. If you do not have them, ask someone to add them for you. What else can I do to help prevent falls? Wear shoes that: Do not have high heels. Have rubber bottoms. Are comfortable and fit you well. Are closed at the toe. Do not wear sandals. If you use a stepladder: Make sure that it is fully opened. Do not climb a closed stepladder. Make sure that both sides of the stepladder are locked into place. Ask someone to hold it for you, if possible. Clearly mark and make sure that you can see: Any grab bars or handrails. First and last steps. Where the edge of each step is. Use tools that help you move around (mobility aids) if they are needed. These include: Canes. Walkers. Scooters. Crutches. Turn on the lights when you  go into a dark area. Replace any light bulbs as soon as they burn out. Set up your furniture so you have a clear path. Avoid moving your furniture around. If any of your floors are uneven, fix them. If there are  any pets around you, be aware of where they are. Review your medicines with your doctor. Some medicines can make you feel dizzy. This can increase your chance of falling. Ask your doctor what other things that you can do to help prevent falls. This information is not intended to replace advice given to you by your health care provider. Make sure you discuss any questions you have with your health care provider. Document Released: 07/21/2009 Document Revised: 03/01/2016 Document Reviewed: 10/29/2014 Elsevier Interactive Patient Education  2017 [employer].

## 2022-10-25 ENCOUNTER — Telehealth: Payer: Self-pay | Admitting: Family Medicine

## 2022-10-25 MED ORDER — VALSARTAN 320 MG PO TABS: 320.0000 mg | ORAL_TABLET | Freq: Every day | ORAL | 0 refills | Status: DC

## 2022-10-25 NOTE — Telephone Encounter (Signed)
[name] with [facility] calling to request prescription for Valsartan since patient's medications are due to be sent out on Tuesday, 10/30/22. Provider was planning to replace Losartan to Valsartan. Please call [phone] to advise or send into pharmacy.

## 2022-10-25 NOTE — Telephone Encounter (Signed)
Hypertension: home blood pressure has not been at goal Change losartan to valsartan 320mg  daily (will change with next packaging to start around 11/08/2022) Due to see PCP in 4 weeks - will recheck blood pressure and adjust meds as needed. If blood pressure not at goal in future can consider increasing metoprolol to 100mg  daily or add hydrochlorothiazide.  Discussed limiting sodium intake and reviewed food with high sodium to avoid / limit. Also discussed increasing high potassium foods to help with blood pressure (0.5 of banana, strawberries, blueberries, lentils, beans, green leafy vegetable - kale and spinach)  Encouraged her to start daily exercise - walking is great, start with 5 to 10 minutes per day and work up as able to goal of 150 minutes per week.    See Tammy's note above. Valsartan 320mg  sent.

## 2022-11-13 ENCOUNTER — Ambulatory Visit: Payer: Medicare HMO | Admitting: Internal Medicine

## 2022-11-13 NOTE — Progress Notes (Deleted)
Name: [name]  Age/ Sex: 77 y.o., female   MRN/ DOB: [id number], 04-28-1946     PCP: Ann Held, DO   Reason for Endocrinology Evaluation: Type 2 Diabetes Mellitus  Initial Endocrine Consultative Visit: 01/17/2021    PATIENT IDENTIFIER: [name] is a 77 y.o. female with a past medical history of HTN, T2Dm, CAD , Osteoporosis and Hx of pancreatitis. The patient has followed with Endocrinology clinic since 4 f/09/2021 or consultative assistance with management of her diabetes.  DIABETIC HISTORY:  [name] was diagnosed with DM in 1998, has intolerance to Metformin due to Gi side effects. Her hemoglobin A1c has ranged from 9.3% in 2015, peaking at 10.1% in 2020.  She has history of Pancreatitis that she believes is caused Metformin   On her initial visit to our clinic she had an A1c of 9.4%  , she was on Jardiance,Glimepiride , Januvia and insulin mix . We stopped Glimepiride as she was not taking it, we stopped Januvia due to hx of pancreatitis. We continued jardiance and adjusted insulin mix     Switched insulin Mix to Basal/prandial insulin 05/2021   SUBJECTIVE:   During the last visit (06/28/2022): A1c 8.6%    Today (11/13/2022): [name] is here for a follow up on diabetes management. She checks her blood sugars 2-3 times daily. The patient has not had hypoglycemic episodes since the last clinic visit.  She did not bring a meter today    She is waiting to have right shoulder sx but waiting on A1c to be less then 8%   She denies nausea, vomiting or diarrhea  She has an episode of incontinence last night that she attributes to nuts      HOME DIABETES REGIMEN:  Jardiance 25 mg, 1 tablet in the morning Tresiba 48 units ONCE daily  Novolog 16 units with brunch and 14 units with supper Correction factor: NovoLog (BG -130/25)      Statin: yes ACE-I/ARB: yes   CONTINUOUS GLUCOSE MONITORING RECORD INTERPRETATION    Dates of  Recording: 9/8-9/21/2023  Sensor description: dexcom  Results statistics:   CGM use % of time 93  Average and SD 193/52  Time in range 46  % Time Above 180 38  % Time above 250 15  % Time Below target <1    Glycemic patterns summary: BG is optimal overnight, hyperglycemia noted during the day  Hyperglycemic episodes postprandial  Hypoglycemic episodes occurred n/a   Overnight periods: optimal     DIABETIC COMPLICATIONS: Microvascular complications:   Denies: CKD, retinopathy, neuropathy Last Eye Exam: Completed 12/2020 scheduled 01/2022  Macrovascular complications:  CAD ( S/P stent placement)  Denies: CVA, PVD   HISTORY:  Past Medical History:  Past Medical History:  Diagnosis Date   Blockage of coronary artery of heart (HCC)    Depression    Diabetes mellitus    Hyperlipidemia    Hypertension    Pancreatitis    Restless leg syndrome    Past Surgical History:  Past Surgical History:  Procedure Laterality Date   CATARACT EXTRACTION, BILATERAL     CORONARY ANGIOPLASTY WITH STENT PLACEMENT     FOOT SURGERY Right    cyst removal   ROTATOR CUFF REPAIR     right   ROTATOR CUFF REPAIR Left    TUBAL LIGATION     Social History:  reports that she quit smoking about 2 years ago. Her smoking use included cigarettes. She smoked an average  of .2 packs per day. She has never used smokeless tobacco. She reports that she does not drink alcohol and does not use drugs. Family History:  Family History  Problem Relation Age of Onset   Heart attack Father    Heart attack Brother    Diabetes Sister    Cirrhosis Sister    Hypertension Sister    Cancer Mother    Hypertension Mother    Vision loss Mother    Cancer Sister    Sleep apnea Sister    Heart Problems Sister    Diabetes Sister    Gout Sister    Diabetes Son    Heart attack Son    Diabetes Daughter    Goiter Daughter    Diabetes Daughter      HOME MEDICATIONS: Allergies as of 11/13/2022        Reactions   Metformin And Related Other (See Comments)   Sick on stomach, sweaty, throw up, pain   Levemir [insulin Detemir] Hives, Swelling   Clindamycin Nausea And Vomiting   Crestor [rosuvastatin Calcium] Palpitations        Medication List        Accurate as of November 13, 2022  7:03 AM. If you have any questions, ask your nurse or doctor.          alendronate 70 MG tablet Commonly known as: FOSAMAX TAKE ONE TABLET BY MOUTH ONCE WEEKLY ON WEDNESDAY   ALPRAZolam 0.25 MG tablet Commonly known as: XANAX Take 1 tablet by mouth three times a day if needed   amLODipine 10 MG tablet Commonly known as: NORVASC TAKE ONE TABLET BY MOUTH ONCE DAILY   aspirin EC 81 MG tablet Take 81 mg by mouth daily.   atorvastatin 40 MG tablet Commonly known as: LIPITOR TAKE ONE TABLET BY MOUTH EVERY EVENING   B-D SINGLE USE SWABS REGULAR Pads USE AS DIRECTED   Comfort EZ Pen Needles 32G X 4 MM Misc Generic drug: Insulin Pen Needle USE AS DIRECTED TWICE DAILY   Dexcom G6 Sensor Misc 1 Device by Does not apply route as directed.   Dexcom G6 Transmitter Misc 1 Device by Does not apply route as directed.   empagliflozin 25 MG Tabs tablet Commonly known as: Jardiance Take 1 tablet (25 mg total) by mouth daily before breakfast.   fenofibrate 160 MG tablet TAKE ONE TABLET BY MOUTH EVERY EVENING   ferrous sulfate 325 (65 FE) MG tablet Take 325 mg by mouth daily with breakfast.   fexofenadine 180 MG tablet Commonly known as: ALLEGRA Take 180 mg by mouth daily as needed for allergies or rhinitis.   Fish Oil 1000 MG Caps Take 1 capsule by mouth in the morning and at bedtime.   FLUoxetine 20 MG capsule Commonly known as: PROZAC TAKE THREE CAPSULES BY MOUTH ONCE DAILY   fluticasone 50 MCG/ACT nasal spray Commonly known as: FLONASE PLACE TWO SPRAYS IN EACH NOSTRIL DAILY   Lancing Device Misc   metoprolol succinate 50 MG 24 hr tablet Commonly known as: TOPROL-XL TAKE ONE  TABLET BY MOUTH ONCE DAILY WITH FOOD   multivitamin with minerals Tabs tablet Take 1 tablet by mouth daily.   NovoLOG FlexPen 100 UNIT/ML FlexPen Generic drug: insulin aspart Max daily 70 units   Tai Doc Control Normal Soln   THERATEARS OP Place 1 drop into both eyes daily as needed (dry eyes).   Tyler Aas FlexTouch 100 UNIT/ML FlexTouch Pen Generic drug: insulin degludec Inject 48 Units into the skin daily.  Please deliver out to patient by 07/25/2022   True Metrix Air Glucose Meter w/Device Kit 1 each by Does not apply route 4 (four) times daily. DX CODE E11.8   True Metrix Blood Glucose Test test strip Generic drug: glucose blood USE AS DIRECTED 4 TIMES A DAY   TRUEplus Lancets 33G Misc USE AS DIRECTED 4 TIMES A DAY   valsartan 320 MG tablet Commonly known as: DIOVAN Take 1 tablet (320 mg total) by mouth daily.   vitamin E 180 MG (400 UNITS) capsule Take 400 Units by mouth daily.         OBJECTIVE:   Vital Signs: There were no vitals taken for this visit.  Wt Readings from Last 3 Encounters:  10/24/22 173 lb (78.5 kg)  06/28/22 173 lb (78.5 kg)  05/18/22 179 lb 6.4 oz (81.4 kg)     Exam: General: Pt appears well and is in NAD  Lungs: Clear with good BS bilat with no rales, rhonchi, or wheezes  Heart: RRR   Extremities: No pretibial edema.   Neuro: MS is good with appropriate affect, pt is alert and Ox3    DM foot exam: 06/23/2021   The skin of the feet is intact without sores or ulcerations. The pedal pulses are 2+ on right and 2+ on left. The sensation is intact to a screening 5.07, 10 gram monofilament bilaterally       DATA REVIEWED:  Lab Results  Component Value Date   HGBA1C 8.6 (A) 06/28/2022   HGBA1C 7.9 (A) 12/13/2021   HGBA1C 9.0 (A) 09/13/2021      Latest Reference Range & Units 05/18/22 14:33  Sodium 135 - 146 mmol/L 137  Potassium 3.5 - 5.3 mmol/L 4.0  Chloride 98 - 110 mmol/L 105  CO2 20 - 32 mmol/L 25  Glucose 65 - 99  mg/dL 152 (H)  BUN 7 - 25 mg/dL 26 (H)  Creatinine 0.60 - 1.00 mg/dL 1.19 (H)  Calcium 8.6 - 10.4 mg/dL 9.8  BUN/Creatinine Ratio 6 - 22 (calc) 22  AG Ratio 1.0 - 2.5 (calc) 2.0  AST 10 - 35 U/L 23  ALT 6 - 29 U/L 20  Total Protein 6.1 - 8.1 g/dL 6.3  Total Bilirubin 0.2 - 1.2 mg/dL 0.5    Latest Reference Range & Units 05/18/22 14:33  Total CHOL/HDL Ratio <5.0 (calc) 3.7  Cholesterol <200 mg/dL 136  HDL Cholesterol > OR = 50 mg/dL 37 (L)  LDL Cholesterol (Calc) mg/dL (calc) 76  Non-HDL Cholesterol (Calc) <130 mg/dL (calc) 99  Triglycerides <150 mg/dL 156 (H)      ASSESSMENT / PLAN / RECOMMENDATIONS:   1) Type 2 Diabetes Mellitus, poorly controlled , With macrovascular  complications - Most recent A1c of 8.6  %. Goal A1c < 7.0 %.     -Patient with worsening glycemic control, A1c increased from 7.9% to 8.6%,  unfortunately she has been eating more sweets than usual -She is compliant with her insulin intake, will make following adjustments -Patient again advised to avoid sugar sweetened foods, limit carbohydrates so that her A1c will trend down enough to be cleared for surgery - GLP-1 agonists and DPP-4 inhibitors are contra-indicated due to hx of pancreatitis  -Today we discussed adding pioglitazone, cautioned against lower extremity edema, she is hesitant at this time  -In reviewing CGM download the patient has been noted with hyperglycemia through the night but worse after supper,   MEDICATIONS:  -Continue Jardiance 25 mg, 1 tablet in the morning  -  Increase Tresiba 48 units ONCE daily  -Change Novolog 16 units with brunch and continue 14 units with supper -Continue correction factor: NovoLog (BG -130/25)     EDUCATION / INSTRUCTIONS: BG monitoring instructions: Patient is instructed to check her blood sugars 3 times a day, before meals. Call [facility] if: BG persistently < 70  I reviewed the Rule of 15 for the treatment of hypoglycemia in detail  with the patient. Literature supplied.   2) Diabetic complications:  Eye: Does not have known diabetic retinopathy.  Neuro/ Feet: Does not have known diabetic peripheral neuropathy .  Renal: Patient does not have known baseline CKD. She   is  on an ACEI/ARB at present.      F/U in 4 months     Signed electronically by: Mack Guise, MD  [facility] Endocrinology  [facility] [address] [place], [place] [address] Phone: [phone] FAX: [phone]   CC: [address] [place] [address] Phone: [phone]  Fax: [phone]  Return to Endocrinology clinic as below: Future Appointments  Date Time Provider [place]  11/13/2022 10:10 AM Chanceler Pullin, Melanie Crazier, MD [facility] None  11/19/2022 10:00 AM Ann Held, DO [facility]  01/23/2023  1:30 PM [facility] PHARMACIST [facility]

## 2022-11-19 ENCOUNTER — Ambulatory Visit (INDEPENDENT_AMBULATORY_CARE_PROVIDER_SITE_OTHER): Payer: Medicare HMO | Admitting: Family Medicine

## 2022-11-19 ENCOUNTER — Encounter: Payer: Self-pay | Admitting: Family Medicine

## 2022-11-19 VITALS — BP 140/60 | HR 58 | Temp 98.4°F | Resp 18 | Ht 65.0 in | Wt 173.4 lb

## 2022-11-19 DIAGNOSIS — I10 Essential (primary) hypertension: Secondary | ICD-10-CM

## 2022-11-19 DIAGNOSIS — M79671 Pain in right foot: Secondary | ICD-10-CM | POA: Insufficient documentation

## 2022-11-19 DIAGNOSIS — K219 Gastro-esophageal reflux disease without esophagitis: Secondary | ICD-10-CM | POA: Diagnosis not present

## 2022-11-19 DIAGNOSIS — E785 Hyperlipidemia, unspecified: Secondary | ICD-10-CM

## 2022-11-19 DIAGNOSIS — E1165 Type 2 diabetes mellitus with hyperglycemia: Secondary | ICD-10-CM | POA: Diagnosis not present

## 2022-11-19 DIAGNOSIS — F419 Anxiety disorder, unspecified: Secondary | ICD-10-CM | POA: Diagnosis not present

## 2022-11-19 LAB — LIPID PANEL
Cholesterol: 156 mg/dL (ref 0–200)
HDL: 36 mg/dL — ABNORMAL LOW (ref >39.00)
LDL Cholesterol: 89 mg/dL (ref 0–99)
NonHDL: 120.12
Total CHOL/HDL Ratio: 4
Triglycerides: 157 mg/dL — ABNORMAL HIGH (ref 0.0–149.0)
VLDL: 31.4 mg/dL (ref 0.0–40.0)

## 2022-11-19 LAB — CBC WITH DIFFERENTIAL/PLATELET
Basophils Absolute: 0 10*3/uL (ref 0.0–0.1)
Basophils Relative: 0.4 % (ref 0.0–3.0)
Eosinophils Absolute: 0 10*3/uL (ref 0.0–0.7)
Eosinophils Relative: 0.6 % (ref 0.0–5.0)
HCT: 39.6 % (ref 36.0–46.0)
Hemoglobin: 13.2 g/dL (ref 12.0–15.0)
Lymphocytes Relative: 35.2 % (ref 12.0–46.0)
Lymphs Abs: 2.8 10*3/uL (ref 0.7–4.0)
MCHC: 33.2 g/dL (ref 30.0–36.0)
MCV: 89.7 fl (ref 78.0–100.0)
Monocytes Absolute: 0.6 10*3/uL (ref 0.1–1.0)
Monocytes Relative: 8.2 % (ref 3.0–12.0)
Neutro Abs: 4.4 10*3/uL (ref 1.4–7.7)
Neutrophils Relative %: 55.6 % (ref 43.0–77.0)
Platelets: 221 10*3/uL (ref 150.0–400.0)
RBC: 4.42 Mil/uL (ref 3.87–5.11)
RDW: 13.9 % (ref 11.5–15.5)
WBC: 7.8 10*3/uL (ref 4.0–10.5)

## 2022-11-19 LAB — COMPREHENSIVE METABOLIC PANEL
ALT: 20 U/L (ref 0–35)
AST: 18 U/L (ref 0–37)
Albumin: 4.3 g/dL (ref 3.5–5.2)
Alkaline Phosphatase: 36 U/L — ABNORMAL LOW (ref 39–117)
BUN: 24 mg/dL — ABNORMAL HIGH (ref 6–23)
CO2: 29 mEq/L (ref 19–32)
Calcium: 10.5 mg/dL (ref 8.4–10.5)
Chloride: 101 mEq/L (ref 96–112)
Creatinine, Ser: 1.04 mg/dL (ref 0.40–1.20)
GFR: 52.08 mL/min — ABNORMAL LOW (ref >60.00)
Glucose, Bld: 271 mg/dL — ABNORMAL HIGH (ref 70–99)
Potassium: 4.5 mEq/L (ref 3.5–5.1)
Sodium: 139 mEq/L (ref 135–145)
Total Bilirubin: 0.6 mg/dL (ref 0.2–1.2)
Total Protein: 6.5 g/dL (ref 6.0–8.3)

## 2022-11-19 MED ORDER — PANTOPRAZOLE SODIUM 40 MG PO TBEC: 40.0000 mg | DELAYED_RELEASE_TABLET | Freq: Every day | ORAL | 1 refills | Status: DC

## 2022-11-19 MED ORDER — TRESIBA FLEXTOUCH 100 UNIT/ML ~~LOC~~ SOPN: 48.0000 [IU] | PEN_INJECTOR | Freq: Every day | SUBCUTANEOUS | 0 refills | Status: DC

## 2022-11-19 NOTE — Assessment & Plan Note (Signed)
Well controlled, no changes to meds. Encouraged heart healthy diet such as the DASH diet and exercise as tolerated.  

## 2022-11-19 NOTE — Assessment & Plan Note (Signed)
hgba1c t0 be checked  minimize simple carbs. Increase exercise as tolerated. Continue current meds

## 2022-11-19 NOTE — Progress Notes (Addendum)
Subjective:   By signing my name below, I, [name], attest that this documentation has been prepared under the direction and in the presence of Ann Held, DO 11/19/22   Patient ID: [name], female    DOB: August 19, 1946, 77 y.o.   MRN: [id number]  Chief Complaint  Patient presents with   Hyperlipidemia   Diabetes   Follow-up    HPI Patient is in today for a 6 month follow up.  She complains of a productive cough with phlegm in the mornings after waking up. She reports that she has been dealing with it her whole life but it has worsened recently in the last couple of months. She has been taking Flonase at night and sometimes in the morning. She also tried Mucinex but it did not help her.   She reported indigestion and GERD and endorses heartburn in her upper abdominal region.   She has been using her Dexcom machine and it has had no trouble with it.  She also complains of a pain in her right foot at the sole of her foot. She endorses a bit of discoloration as well. It hurts when she walks. There's bruising there as well. Tends to hurt more in the evenings, especially after she's been on her feet for a while.   She denies having any fever, new muscle pain, new joint pain, new moles, sinus pain, sore throat, chest pain, palpitations, SOB, wheezing, n/v/d, constipation, blood in stool, dysuria, frequency, hematuria, or headaches at this time.  Past Medical History:  Diagnosis Date   Blockage of coronary artery of heart (HCC)    Depression    Diabetes mellitus    Hyperlipidemia    Hypertension    Pancreatitis    Restless leg syndrome     Past Surgical History:  Procedure Laterality Date   CATARACT EXTRACTION, BILATERAL     CORONARY ANGIOPLASTY WITH STENT PLACEMENT     FOOT SURGERY Right    cyst removal   ROTATOR CUFF REPAIR     right   ROTATOR CUFF REPAIR Left    TUBAL LIGATION      Family History  Problem Relation Age of Onset   Heart attack  Father    Heart attack Brother    Diabetes Sister    Cirrhosis Sister    Hypertension Sister    Cancer Mother    Hypertension Mother    Vision loss Mother    Cancer Sister    Sleep apnea Sister    Heart Problems Sister    Diabetes Sister    Gout Sister    Diabetes Son    Heart attack Son    Diabetes Daughter    Goiter Daughter    Diabetes Daughter     Social History   Socioeconomic History   Marital status: Married    Spouse name: Not on file   Number of children: Not on file   Years of education: Not on file   Highest education level: Not on file  Occupational History   Not on file  Tobacco Use   Smoking status: Former    Packs/day: 0.20    Types: Cigarettes    Quit date: 05/05/2020    Years since quitting: 2.5   Smokeless tobacco: Never   Tobacco comments:    quit in 2021  Vaping Use   Vaping Use: Never used  Substance and Sexual Activity   Alcohol use: No   Drug use: No  Sexual activity: Not Currently  Other Topics Concern   Not on file  Social History Narrative   Exercise: seldom   Caffeine use: seldom   Social Determinants of Health   Financial Resource Strain: Low Risk  (10/24/2022)   Overall Financial Resource Strain (CARDIA)    Difficulty of Paying Living Expenses: Not hard at all  Recent Concern: Financial Resource Strain - Medium Risk (10/23/2022)   Overall Financial Resource Strain (CARDIA)    Difficulty of Paying Living Expenses: Somewhat hard  Food Insecurity: No Food Insecurity (10/24/2022)   Hunger Vital Sign    Worried About Running Out of Food in the Last Year: Never true    Ran Out of Food in the Last Year: Never true  Transportation Needs: No Transportation Needs (10/24/2022)   PRAPARE - Hydrologist (Medical): No    Lack of Transportation (Non-Medical): No  Physical Activity: Inactive (10/24/2022)   Exercise Vital Sign    Days of Exercise per Week: 0 days    Minutes of Exercise per Session: 0 min   Stress: No Stress Concern Present (10/24/2022)   [place]    Feeling of Stress : Not at all  Social Connections: Moderately Integrated (10/24/2022)   Social Connection and Isolation Panel [NHANES]    Frequency of Communication with Friends and Family: More than three times a week    Frequency of Social Gatherings with Friends and Family: More than three times a week    Attends Religious Services: Never    Marine scientist or Organizations: Yes    Attends Archivist Meetings: 1 to 4 times per year    Marital Status: Married  Human resources officer Violence: Not At Risk (10/24/2022)   Humiliation, Afraid, Rape, and Kick questionnaire    Fear of Current or Ex-Partner: No    Emotionally Abused: No    Physically Abused: No    Sexually Abused: No    Outpatient Medications Prior to Visit  Medication Sig Dispense Refill   Alcohol Swabs (B-D SINGLE USE SWABS REGULAR) PADS USE AS DIRECTED 400 each 1   alendronate (FOSAMAX) 70 MG tablet TAKE ONE TABLET BY MOUTH ONCE WEEKLY ON WEDNESDAY 12 tablet 3   ALPRAZolam (XANAX) 0.25 MG tablet Take 1 tablet by mouth three times a day if needed 60 tablet 0   amLODipine (NORVASC) 10 MG tablet TAKE ONE TABLET BY MOUTH ONCE DAILY 90 tablet 1   aspirin EC 81 MG tablet Take 81 mg by mouth daily.     atorvastatin (LIPITOR) 40 MG tablet TAKE ONE TABLET BY MOUTH EVERY EVENING 90 tablet 3   Carboxymethylcellulose Sodium (THERATEARS OP) Place 1 drop into both eyes daily as needed (dry eyes).     COMFORT EZ PEN NEEDLES 32G X 4 MM MISC USE AS DIRECTED TWICE DAILY 200 each 3   Continuous Blood Gluc Sensor (DEXCOM G6 SENSOR) MISC 1 Device by Does not apply route as directed. 9 each 3   empagliflozin (JARDIANCE) 25 MG TABS tablet Take 1 tablet (25 mg total) by mouth daily before breakfast. 90 tablet 3   fenofibrate 160 MG tablet TAKE ONE TABLET BY MOUTH EVERY EVENING 90 tablet 1   ferrous sulfate  325 (65 FE) MG tablet Take 325 mg by mouth daily with breakfast.      fexofenadine (ALLEGRA) 180 MG tablet Take 180 mg by mouth daily as needed for allergies or rhinitis.  FLUoxetine (PROZAC) 20 MG capsule TAKE THREE CAPSULES BY MOUTH ONCE DAILY 270 capsule 3   fluticasone (FLONASE) 50 MCG/ACT nasal spray PLACE TWO SPRAYS IN EACH NOSTRIL DAILY 48 g 1   glucose blood (TRUE METRIX BLOOD GLUCOSE TEST) test strip USE AS DIRECTED 4 TIMES A DAY 400 strip 1   insulin aspart (NOVOLOG FLEXPEN) 100 UNIT/ML FlexPen Max daily 70 units 60 mL 6   metoprolol succinate (TOPROL-XL) 50 MG 24 hr tablet TAKE ONE TABLET BY MOUTH ONCE DAILY WITH FOOD 90 tablet 1   Multiple Vitamin (MULTIVITAMIN WITH MINERALS) TABS Take 1 tablet by mouth daily.     Omega-3 Fatty Acids (FISH OIL) 1000 MG CAPS Take 1 capsule by mouth in the morning and at bedtime.     valsartan (DIOVAN) 320 MG tablet Take 1 tablet (320 mg total) by mouth daily. 90 tablet 0   vitamin E 400 UNIT capsule Take 400 Units by mouth daily.     Blood Glucose Calibration (TAI DOC CONTROL) NORMAL SOLN      Blood Glucose Monitoring Suppl (TRUE METRIX AIR GLUCOSE METER) w/Device KIT 1 each by Does not apply route 4 (four) times daily. DX CODE E11.8 1 kit 0   Continuous Blood Gluc Transmit (DEXCOM G6 TRANSMITTER) MISC 1 Device by Does not apply route as directed. 1 each 3   insulin degludec (TRESIBA FLEXTOUCH) 100 UNIT/ML FlexTouch Pen Inject 48 Units into the skin daily. Please deliver out to patient by 07/25/2022 45 mL 0   Lancet Devices (LANCING DEVICE) MISC      TRUEplus Lancets 33G MISC USE AS DIRECTED 4 TIMES A DAY 400 each 1   No facility-administered medications prior to visit.    Allergies  Allergen Reactions   Metformin And Related Other (See Comments)    Sick on stomach, sweaty, throw up, pain   Levemir [Insulin Detemir] Hives and Swelling   Clindamycin Nausea And Vomiting   Crestor [Rosuvastatin Calcium] Palpitations    Review of Systems   Respiratory:  Positive for cough (productive -- in the mornings) and sputum production.   Gastrointestinal:  Positive for heartburn. Abdominal pain: associated with indigestion/GERD.      Objective:    Physical Exam Vitals and nursing note reviewed.  Constitutional:      Appearance: She is well-developed.  HENT:     Head: Normocephalic and atraumatic.  Eyes:     Conjunctiva/sclera: Conjunctivae normal.  Neck:     Thyroid: No thyromegaly.     Vascular: No carotid bruit or JVD.  Cardiovascular:     Rate and Rhythm: Normal rate and regular rhythm.     Heart sounds: Normal heart sounds. No murmur heard. Pulmonary:     Effort: Pulmonary effort is normal. No respiratory distress.     Breath sounds: Normal breath sounds. No wheezing or rales.  Chest:     Chest wall: No tenderness.  Musculoskeletal:        General: Tenderness present. No swelling, deformity or signs of injury. Normal range of motion.     Cervical back: Normal range of motion and neck supple.     Right lower leg: No edema.     Left lower leg: No edema.     Comments: R heel pain with palpation No swelling / redness   Neurological:     Mental Status: She is alert and oriented to person, place, and time.     BP (!) 140/60 (BP Location: Left Arm, Patient Position: Sitting, Cuff Size: Normal)  Pulse (!) 58   Temp 98.4 F (36.9 C) (Oral)   Resp 18   Ht 5' 5"$  (1.651 m)   Wt 173 lb 6.4 oz (78.7 kg)   SpO2 99%   BMI 28.86 kg/m  Wt Readings from Last 3 Encounters:  11/19/22 173 lb 6.4 oz (78.7 kg)  10/24/22 173 lb (78.5 kg)  06/28/22 173 lb (78.5 kg)       Assessment & Plan:  Type 2 diabetes mellitus with hyperglycemia, without long-term current use of insulin ([place]) -     Tyler Aas FlexTouch; Inject 48 Units into the skin daily. Please deliver out to patient by 07/25/2022  Dispense: 45 mL; Refill: 0 -     Lipid panel -     CBC with Differential/Platelet -     Comprehensive metabolic panel -     Ambulatory  referral to Podiatry  Anxiety  Primary hypertension -     Lipid panel -     CBC with Differential/Platelet -     Comprehensive metabolic panel  Hyperlipidemia, unspecified hyperlipidemia type -     Lipid panel -     CBC with Differential/Platelet -     Comprehensive metabolic panel  Gastroesophageal reflux disease, unspecified whether esophagitis present -     Pantoprazole Sodium; Take 1 tablet (40 mg total) by mouth daily.  Dispense: 90 tablet; Refill: 1 -     H. pylori antibody, IgG  Pain of right heel Assessment & Plan: ? Spur / plantar fascitis  Refer to podiatry   Orders: -     Ambulatory referral to Podiatry  Essential hypertension Assessment & Plan: Well controlled, no changes to meds. Encouraged heart healthy diet such as the DASH diet and exercise as tolerated.        I,Rachel Rivera,acting as a Education administrator for [employer], DO.,have documented all relevant documentation on the behalf of Ann Held, DO,as directed by  Ann Held, DO while in the presence of [place], DO, personally preformed the services described in this documentation.  All medical record entries made by the scribe were at my direction and in my presence.  I have reviewed the chart and discharge instructions (if applicable) and agree that the record reflects my personal performance and is accurate and complete. 11/19/22   Ann Held, DO

## 2022-11-19 NOTE — Patient Instructions (Signed)

## 2022-11-19 NOTE — Assessment & Plan Note (Signed)
?   Spur / plantar fascitis  Refer to podiatry

## 2022-11-20 LAB — H. PYLORI ANTIBODY, IGG: H Pylori IgG: POSITIVE — AB

## 2022-11-21 ENCOUNTER — Other Ambulatory Visit: Payer: Self-pay | Admitting: Family Medicine

## 2022-11-22 ENCOUNTER — Other Ambulatory Visit: Payer: Self-pay | Admitting: *Deleted

## 2022-11-22 DIAGNOSIS — A048 Other specified bacterial intestinal infections: Secondary | ICD-10-CM

## 2022-11-22 DIAGNOSIS — K219 Gastro-esophageal reflux disease without esophagitis: Secondary | ICD-10-CM

## 2022-11-22 MED ORDER — METRONIDAZOLE 500 MG PO TABS: 500.0000 mg | ORAL_TABLET | Freq: Two times a day (BID) | ORAL | 0 refills | Status: AC

## 2022-11-22 MED ORDER — AMOXICILLIN 500 MG PO CAPS: 1000.0000 mg | ORAL_CAPSULE | Freq: Two times a day (BID) | ORAL | 0 refills | Status: AC

## 2022-11-22 MED ORDER — CLARITHROMYCIN 500 MG PO TABS: 500.0000 mg | ORAL_TABLET | Freq: Two times a day (BID) | ORAL | 0 refills | Status: AC

## 2022-11-28 ENCOUNTER — Ambulatory Visit: Payer: Medicare HMO | Admitting: Podiatry

## 2022-11-30 ENCOUNTER — Encounter: Payer: Self-pay | Admitting: Family Medicine

## 2022-12-10 NOTE — Progress Notes (Unsigned)
Name: ISABELA GALAS  Age/ Sex: 77 y.o., female   MRN/ DOB: [id number], 06-13-1946     PCP: Ann Held, DO   Reason for Endocrinology Evaluation: Type 2 Diabetes Mellitus  Initial Endocrine Consultative Visit: 01/17/2021    PATIENT IDENTIFIER: Ms. AYDIA KUBLY is a 77 y.o. female with a past medical history of HTN, T2Dm, CAD , Osteoporosis and Hx of pancreatitis. The patient has followed with Endocrinology clinic since 4 f/09/2021 or consultative assistance with management of her diabetes.  DIABETIC HISTORY:  Ms. Bajaj was diagnosed with DM in 1998, has intolerance to Metformin due to Gi side effects. Her hemoglobin A1c has ranged from 9.3% in 2015, peaking at 10.1% in 2020.  She has history of Pancreatitis that she believes is caused Metformin   On her initial visit to our clinic she had an A1c of 9.4%  , she was on Jardiance,Glimepiride , Januvia and insulin mix . We stopped Glimepiride as she was not taking it, we stopped Januvia due to hx of pancreatitis. We continued jardiance and adjusted insulin mix     Switched insulin Mix to Basal/prandial insulin 05/2021   SUBJECTIVE:   During the last visit (06/28/2022): A1c 8.6%    Today (12/11/2022): Ms. Deeken is here for a follow up on diabetes management. She checks her blood sugars 2-3 times daily. The patient has not had hypoglycemic episodes since the last clinic visit.  She did not bring a meter today    She is waiting to have right shoulder sx but waiting on A1c to be less then 8%   She was having indigestion and has issues, was treated with Abx  Rectal incontinence  has improved   dShe has tingling of the right heel with occasional pain at the right heel, Scheduled to see podiatry 3/20th   HOME DIABETES REGIMEN:  Jardiance 25 mg, 1 tablet in the morning Tresiba 48 units ONCE daily  Novolog 16 units with brunch and 14 units with supper Correction factor: NovoLog (BG -130/25)      Statin:  yes ACE-I/ARB: yes   CONTINUOUS GLUCOSE MONITORING RECORD INTERPRETATION    Dates of Recording: 2/21-12/11/2022  Sensor description: dexcom  Results statistics:   CGM use % of time 86  Average and SD 187/59  Time in range 49  % Time Above 180 35  % Time above 250 15  % Time Below target <1    Glycemic patterns summary: BG is optimal overnight, hyperglycemia noted during the day  Hyperglycemic episodes postprandial  Hypoglycemic episodes occurred n/a   Overnight periods: optimal     DIABETIC COMPLICATIONS: Microvascular complications:  Neuropathy  Denies: CKD, retinopathy, neuropathy Last Eye Exam: Completed 01/2022  Macrovascular complications:  CAD ( S/P stent placement)  Denies: CVA, PVD   HISTORY:  Past Medical History:  Past Medical History:  Diagnosis Date   Blockage of coronary artery of heart ([place])    Depression    Diabetes mellitus    Hyperlipidemia    Hypertension    Pancreatitis    Restless leg syndrome    Past Surgical History:  Past Surgical History:  Procedure Laterality Date   CATARACT EXTRACTION, BILATERAL     CORONARY ANGIOPLASTY WITH STENT PLACEMENT     FOOT SURGERY Right    cyst removal   ROTATOR CUFF REPAIR     right   ROTATOR CUFF REPAIR Left    TUBAL LIGATION     Social History:  reports that she quit  smoking about 2 years ago. Her smoking use included cigarettes. She smoked an average of .2 packs per day. She has never used smokeless tobacco. She reports that she does not drink alcohol and does not use drugs. Family History:  Family History  Problem Relation Age of Onset   Heart attack Father    Heart attack Brother    Diabetes Sister    Cirrhosis Sister    Hypertension Sister    Cancer Mother    Hypertension Mother    Vision loss Mother    Cancer Sister    Sleep apnea Sister    Heart Problems Sister    Diabetes Sister    Gout Sister    Diabetes Son    Heart attack Son    Diabetes Daughter    Goiter Daughter     Diabetes Daughter      HOME MEDICATIONS: Allergies as of 12/11/2022       Reactions   Metformin And Related Other (See Comments)   Sick on stomach, sweaty, throw up, pain   Levemir [insulin Detemir] Hives, Swelling   Clindamycin Nausea And Vomiting   Crestor [rosuvastatin Calcium] Palpitations        Medication List        Accurate as of December 11, 2022 11:26 AM. If you have any questions, ask your nurse or doctor.          alendronate 70 MG tablet Commonly known as: FOSAMAX TAKE ONE TABLET BY MOUTH ONCE WEEKLY ON WEDNESDAY   ALPRAZolam 0.25 MG tablet Commonly known as: XANAX Take 1 tablet by mouth three times a day if needed   amLODipine 10 MG tablet Commonly known as: NORVASC TAKE ONE TABLET BY MOUTH ONCE DAILY   aspirin EC 81 MG tablet Take 81 mg by mouth daily.   atorvastatin 40 MG tablet Commonly known as: LIPITOR TAKE ONE TABLET BY MOUTH EVERY EVENING   B-D SINGLE USE SWABS REGULAR Pads USE AS DIRECTED   Comfort EZ Pen Needles 32G X 4 MM Misc Generic drug: Insulin Pen Needle USE AS DIRECTED TWICE DAILY   Dexcom G6 Sensor Misc 1 Device by Does not apply route as directed.   empagliflozin 25 MG Tabs tablet Commonly known as: Jardiance Take 1 tablet (25 mg total) by mouth daily before breakfast.   fenofibrate 160 MG tablet TAKE ONE TABLET BY MOUTH EVERY EVENING   ferrous sulfate 325 (65 FE) MG tablet Take 325 mg by mouth daily with breakfast.   fexofenadine 180 MG tablet Commonly known as: ALLEGRA Take 180 mg by mouth daily as needed for allergies or rhinitis.   Fish Oil 1000 MG Caps Take 1 capsule by mouth in the morning and at bedtime.   FLUoxetine 20 MG capsule Commonly known as: PROZAC TAKE THREE CAPSULES BY MOUTH ONCE DAILY   fluticasone 50 MCG/ACT nasal spray Commonly known as: FLONASE PLACE TWO SPRAYS IN EACH NOSTRIL DAILY   metoprolol succinate 50 MG 24 hr tablet Commonly known as: TOPROL-XL TAKE ONE TABLET BY MOUTH ONCE DAILY  WITH FOOD   multivitamin with minerals Tabs tablet Take 1 tablet by mouth daily.   NovoLOG FlexPen 100 UNIT/ML FlexPen Generic drug: insulin aspart Max daily 70 units   pantoprazole 40 MG tablet Commonly known as: PROTONIX Take 1 tablet (40 mg total) by mouth daily.   THERATEARS OP Place 1 drop into both eyes daily as needed (dry eyes).   Tyler Aas FlexTouch 100 UNIT/ML FlexTouch Pen Generic drug: insulin degludec Inject 48  Units into the skin daily. Please deliver out to patient by 07/25/2022   True Metrix Blood Glucose Test test strip Generic drug: glucose blood USE AS DIRECTED 4 TIMES A DAY   valsartan 320 MG tablet Commonly known as: DIOVAN Take 1 tablet (320 mg total) by mouth daily.   vitamin E 180 MG (400 UNITS) capsule Take 400 Units by mouth daily.         OBJECTIVE:   Vital Signs: BP 130/70 (BP Location: Left Arm, Patient Position: Sitting, Cuff Size: Large)   Pulse 80   Ht '5\' 5"'$  (1.651 m)   Wt 174 lb (78.9 kg)   SpO2 94%   BMI 28.96 kg/m   Wt Readings from Last 3 Encounters:  12/11/22 174 lb (78.9 kg)  11/19/22 173 lb 6.4 oz (78.7 kg)  10/24/22 173 lb (78.5 kg)     Exam: General: Pt appears well and is in NAD  Lungs: Clear with good BS bilat with no rales, rhonchi, or wheezes  Heart: RRR   Extremities: No pretibial edema.   Neuro: MS is good with appropriate affect, pt is alert and Ox3    DM foot exam: 12/11/2022   The skin of the feet is intact without sores or ulcerations. The pedal pulses are 2+ on right and 2+ on left. The sensation is decreased  to a screening 5.07, 10 gram monofilament bilaterally at the heels        DATA REVIEWED:  Lab Results  Component Value Date   HGBA1C 8.1 (A) 12/11/2022   HGBA1C 8.6 (A) 06/28/2022   HGBA1C 7.9 (A) 12/13/2021      Latest Reference Range & Units 05/18/22 14:33  Sodium 135 - 146 mmol/L 137  Potassium 3.5 - 5.3 mmol/L 4.0  Chloride 98 - 110 mmol/L 105  CO2 20 - 32 mmol/L 25  Glucose  65 - 99 mg/dL 152 (H)  BUN 7 - 25 mg/dL 26 (H)  Creatinine 0.60 - 1.00 mg/dL 1.19 (H)  Calcium 8.6 - 10.4 mg/dL 9.8  BUN/Creatinine Ratio 6 - 22 (calc) 22  AG Ratio 1.0 - 2.5 (calc) 2.0  AST 10 - 35 U/L 23  ALT 6 - 29 U/L 20  Total Protein 6.1 - 8.1 g/dL 6.3  Total Bilirubin 0.2 - 1.2 mg/dL 0.5    Latest Reference Range & Units 05/18/22 14:33  Total CHOL/HDL Ratio <5.0 (calc) 3.7  Cholesterol <200 mg/dL 136  HDL Cholesterol > OR = 50 mg/dL 37 (L)  LDL Cholesterol (Calc) mg/dL (calc) 76  Non-HDL Cholesterol (Calc) <130 mg/dL (calc) 99  Triglycerides <150 mg/dL 156 (H)      ASSESSMENT / PLAN / RECOMMENDATIONS:   1) Type 2 Diabetes Mellitus, poorly controlled , With macrovascular  complications - Most recent A1c of 8.1  %. Goal A1c < 7.0 %.     -A1c has trended down but continues to be above goal -In reviewing her CGM download, the patient has been noted with optimal BG's during the night but rise during the day, this is due to insulin-carbohydrate mismatch versus inconsistent intake of prandial insulin -Patient advised to avoid snacks and limited amount of carbohydrates as last night she had chocolate candy which resulted in BG readings >250 mg/dL  - GLP-1 agonists and DPP-4 inhibitors are contra-indicated due to hx of pancreatitis   -I have offered pioglitazone in the past but she has been reluctant  MEDICATIONS:  -Continue Jardiance 25 mg, 1 tablet in the morning  -Continue Tresiba 48 units  ONCE daily  -Change Novolog 16 units with brunch and continue 18 units with supper -Continue correction factor: NovoLog (BG -130/25)     EDUCATION / INSTRUCTIONS: BG monitoring instructions: Patient is instructed to check her blood sugars 3 times a day, before meals. Call [facility] if: BG persistently < 70  I reviewed the Rule of 15 for the treatment of hypoglycemia in detail with the patient. Literature supplied.   2) Diabetic complications:  Eye: Does not  have known diabetic retinopathy.  Neuro/ Feet: Does not have known diabetic peripheral neuropathy .  Renal: Patient does not have known baseline CKD. She   is  on an ACEI/ARB at present.      F/U in 4 months     Signed electronically by: Mack Guise, MD  [facility]  [facility] [address] [place], [place] [address] Phone: [phone] FAX: [phone]   CC: [address] [place] [address] Phone: [phone]  Fax: [phone]  Return to Endocrinology clinic as below: Future Appointments  Date Time Provider [place]  12/26/2022  1:30 PM Felipa Furnace, DPM [facility]  01/23/2023  1:30 PM [facility] PHARMACIST [facility]

## 2022-12-11 ENCOUNTER — Encounter: Payer: Self-pay | Admitting: Internal Medicine

## 2022-12-11 ENCOUNTER — Ambulatory Visit: Payer: Medicare HMO | Admitting: Internal Medicine

## 2022-12-11 VITALS — BP 130/70 | HR 80 | Ht 65.0 in | Wt 174.0 lb

## 2022-12-11 DIAGNOSIS — E1159 Type 2 diabetes mellitus with other circulatory complications: Secondary | ICD-10-CM

## 2022-12-11 DIAGNOSIS — Z794 Long term (current) use of insulin: Secondary | ICD-10-CM

## 2022-12-11 DIAGNOSIS — E1165 Type 2 diabetes mellitus with hyperglycemia: Secondary | ICD-10-CM

## 2022-12-11 LAB — POCT GLYCOSYLATED HEMOGLOBIN (HGB A1C): Hemoglobin A1C: 8.1 % — AB (ref 4.0–5.6)

## 2022-12-11 NOTE — Patient Instructions (Signed)
-   Continue Jardiance 25 mg, 1 tablet in the morning  - Continue Tresiba 48 units ONCE daily  - Change  Novolog 16 units with Brunch , 18 units with Supper  -Novolog correctional insulin: ADD extra units on insulin to your meal-time Novolog dose if your blood sugars are higher than 155. Use the scale below to help guide you:   Blood sugar before meal Number of units to inject  Less than 155 0 unit  156 -  180 1 units  181 -  205 2 units  206 -  230 3 units  231 -  255 4 units  256 -  280 5 units  281 -  305 6 units  306 -  330 7 units  331 -  355 8 units         HOW TO TREAT LOW BLOOD SUGARS (Blood sugar LESS THAN 70 MG/DL) Please follow the RULE OF 15 for the treatment of hypoglycemia treatment (when your (blood sugars are less than 70 mg/dL)   STEP 1: Take 15 grams of carbohydrates when your blood sugar is low, which includes:  3-4 GLUCOSE TABS  OR 3-4 OZ OF JUICE OR REGULAR SODA OR ONE TUBE OF GLUCOSE GEL    STEP 2: RECHECK blood sugar in 15 MINUTES STEP 3: If your blood sugar is still low at the 15 minute recheck --> then, go back to STEP 1 and treat AGAIN with another 15 grams of carbohydrates

## 2022-12-14 ENCOUNTER — Telehealth: Payer: Self-pay | Admitting: Family Medicine

## 2022-12-14 ENCOUNTER — Other Ambulatory Visit: Payer: Self-pay | Admitting: Family Medicine

## 2022-12-14 DIAGNOSIS — N760 Acute vaginitis: Secondary | ICD-10-CM

## 2022-12-14 MED ORDER — FLUCONAZOLE 150 MG PO TABS: 150.0000 mg | ORAL_TABLET | Freq: Every day | ORAL | 0 refills | Status: DC

## 2022-12-14 NOTE — Telephone Encounter (Signed)
Pt has finished her antibiotics for h. Pylori but now has thrush and is going to need something called in. Heather made aware.   [place] at  [address], [place] [address]

## 2022-12-14 NOTE — Telephone Encounter (Signed)
Pt made aware

## 2022-12-24 DIAGNOSIS — E1165 Type 2 diabetes mellitus with hyperglycemia: Secondary | ICD-10-CM | POA: Diagnosis not present

## 2022-12-26 ENCOUNTER — Encounter: Payer: Self-pay | Admitting: Podiatry

## 2022-12-26 ENCOUNTER — Ambulatory Visit: Payer: Medicare HMO | Admitting: Podiatry

## 2022-12-26 DIAGNOSIS — M722 Plantar fascial fibromatosis: Secondary | ICD-10-CM

## 2022-12-26 NOTE — Progress Notes (Signed)
Subjective:  Patient ID: [name], female    DOB: 1946-04-27,  MRN: [id number]  Chief Complaint  Patient presents with   Diabetes    77 y.o. female presents with the above complaint.  Patient presents with bilateral heel pain that has been on for quite some time is progressive, worse worse with ambulation worse with pressure she would like to discuss treatment options for it.  She is a diabetic.  She denies any other acute pain scale 7 out of 10 dull achy in nature hurts with ambulation worse with taking for step in the morning.   Review of Systems: Negative except as noted in the HPI. Denies N/V/F/Ch.  Past Medical History:  Diagnosis Date   Blockage of coronary artery of heart ([facility])    Depression    Diabetes mellitus    Hyperlipidemia    Hypertension    Pancreatitis    Restless leg syndrome     Current Outpatient Medications:    Alcohol Swabs (B-D SINGLE USE SWABS REGULAR) PADS, USE AS DIRECTED, Disp: 400 each, Rfl: 1   alendronate (FOSAMAX) 70 MG tablet, TAKE ONE TABLET BY MOUTH ONCE WEEKLY ON WEDNESDAY, Disp: 12 tablet, Rfl: 3   ALPRAZolam (XANAX) 0.25 MG tablet, Take 1 tablet by mouth three times a day if needed, Disp: 60 tablet, Rfl: 0   amLODipine (NORVASC) 10 MG tablet, TAKE ONE TABLET BY MOUTH ONCE DAILY, Disp: 90 tablet, Rfl: 1   aspirin EC 81 MG tablet, Take 81 mg by mouth daily., Disp: , Rfl:    atorvastatin (LIPITOR) 40 MG tablet, TAKE ONE TABLET BY MOUTH EVERY EVENING, Disp: 90 tablet, Rfl: 3   Carboxymethylcellulose Sodium (THERATEARS OP), Place 1 drop into both eyes daily as needed (dry eyes)., Disp: , Rfl:    COMFORT EZ PEN NEEDLES 32G X 4 MM MISC, USE AS DIRECTED TWICE DAILY, Disp: 200 each, Rfl: 3   Continuous Blood Gluc Sensor (DEXCOM G6 SENSOR) MISC, 1 Device by Does not apply route as directed., Disp: 9 each, Rfl: 3   empagliflozin (JARDIANCE) 25 MG TABS tablet, Take 1 tablet (25 mg total) by mouth daily before breakfast., Disp: 90 tablet, Rfl: 3    fenofibrate 160 MG tablet, TAKE ONE TABLET BY MOUTH EVERY EVENING, Disp: 90 tablet, Rfl: 1   ferrous sulfate 325 (65 FE) MG tablet, Take 325 mg by mouth daily with breakfast. , Disp: , Rfl:    fexofenadine (ALLEGRA) 180 MG tablet, Take 180 mg by mouth daily as needed for allergies or rhinitis., Disp: , Rfl:    fluconazole (DIFLUCAN) 150 MG tablet, Take 1 tablet (150 mg total) by mouth daily. May repeat in 3 days as needed, Disp: 2 tablet, Rfl: 0   FLUoxetine (PROZAC) 20 MG capsule, TAKE THREE CAPSULES BY MOUTH ONCE DAILY, Disp: 270 capsule, Rfl: 3   fluticasone (FLONASE) 50 MCG/ACT nasal spray, PLACE TWO SPRAYS IN EACH NOSTRIL DAILY, Disp: 48 g, Rfl: 1   glucose blood (TRUE METRIX BLOOD GLUCOSE TEST) test strip, USE AS DIRECTED 4 TIMES A DAY, Disp: 400 strip, Rfl: 1   insulin aspart (NOVOLOG FLEXPEN) 100 UNIT/ML FlexPen, Max daily 70 units, Disp: 60 mL, Rfl: 6   insulin degludec (TRESIBA FLEXTOUCH) 100 UNIT/ML FlexTouch Pen, Inject 48 Units into the skin daily. Please deliver out to patient by 07/25/2022, Disp: 45 mL, Rfl: 0   metoprolol succinate (TOPROL-XL) 50 MG 24 hr tablet, TAKE ONE TABLET BY MOUTH ONCE DAILY WITH FOOD, Disp: 90 tablet, Rfl: 1  Multiple Vitamin (MULTIVITAMIN WITH MINERALS) TABS, Take 1 tablet by mouth daily., Disp: , Rfl:    Omega-3 Fatty Acids (FISH OIL) 1000 MG CAPS, Take 1 capsule by mouth in the morning and at bedtime., Disp: , Rfl:    pantoprazole (PROTONIX) 40 MG tablet, Take 1 tablet (40 mg total) by mouth daily., Disp: 90 tablet, Rfl: 1   valsartan (DIOVAN) 320 MG tablet, Take 1 tablet (320 mg total) by mouth daily., Disp: 90 tablet, Rfl: 0   vitamin E 400 UNIT capsule, Take 400 Units by mouth daily., Disp: , Rfl:   Social History   Tobacco Use  Smoking Status Former   Packs/day: .2   Types: Cigarettes   Quit date: 05/05/2020   Years since quitting: 2.6  Smokeless Tobacco Never  Tobacco Comments   quit in 2021    Allergies  Allergen Reactions   Metformin  And Related Other (See Comments)    Sick on stomach, sweaty, throw up, pain   Levemir [Insulin Detemir] Hives and Swelling   Clindamycin Nausea And Vomiting   Crestor [Rosuvastatin Calcium] Palpitations   Objective:  There were no vitals filed for this visit. There is no height or weight on file to calculate BMI. Constitutional Well developed. Well nourished.  Vascular Dorsalis pedis pulses palpable bilaterally. Posterior tibial pulses palpable bilaterally. Capillary refill normal to all digits.  No cyanosis or clubbing noted. Pedal hair growth normal.  Neurologic Normal speech. Oriented to person, place, and time. Epicritic sensation to light touch grossly present bilaterally.  Dermatologic Nails well groomed and normal in appearance. No open wounds. No skin lesions.  Orthopedic: Normal joint ROM without pain or crepitus bilaterally. No visible deformities. Tender to palpation at the calcaneal tuber bilaterally. No pain with calcaneal squeeze bilaterally. Ankle ROM diminished range of motion bilaterally. Silfverskiold Test: positive bilaterally.   Radiographs: None  Assessment:   1. Plantar fasciitis of right foot   2. Plantar fasciitis of left foot    Plan:  Patient was evaluated and treated and all questions answered.  Plantar Fasciitis, bilaterally - XR reviewed as above.  - Educated on icing and stretching. Instructions given.  - Injection delivered to the plantar fascia as below. - DME: Plantar fascial brace dispensed to support the medial longitudinal arch of the foot and offload pressure from the heel and prevent arch collapse during weightbearing - Pharmacologic management: None  Procedure: Injection Tendon/Ligament Location: Bilateral plantar fascia at the glabrous junction; medial approach. Skin Prep: alcohol Injectate: 0.5 cc 0.5% marcaine plain, 0.5 cc of 1% Lidocaine, 0.5 cc kenalog 10. Disposition: Patient tolerated procedure well. Injection site  dressed with a band-aid.  No follow-ups on file.

## 2023-01-13 ENCOUNTER — Other Ambulatory Visit: Payer: Self-pay | Admitting: Family Medicine

## 2023-01-13 ENCOUNTER — Other Ambulatory Visit: Payer: Self-pay | Admitting: Internal Medicine

## 2023-01-22 ENCOUNTER — Encounter: Payer: Self-pay | Admitting: *Deleted

## 2023-01-23 ENCOUNTER — Other Ambulatory Visit: Payer: Self-pay | Admitting: Family Medicine

## 2023-01-23 ENCOUNTER — Ambulatory Visit (INDEPENDENT_AMBULATORY_CARE_PROVIDER_SITE_OTHER): Payer: Medicare HMO | Admitting: Pharmacist

## 2023-01-23 DIAGNOSIS — E1169 Type 2 diabetes mellitus with other specified complication: Secondary | ICD-10-CM

## 2023-01-23 DIAGNOSIS — I10 Essential (primary) hypertension: Secondary | ICD-10-CM

## 2023-01-23 DIAGNOSIS — E1165 Type 2 diabetes mellitus with hyperglycemia: Secondary | ICD-10-CM

## 2023-01-23 DIAGNOSIS — E785 Hyperlipidemia, unspecified: Secondary | ICD-10-CM

## 2023-01-23 MED ORDER — ATORVASTATIN CALCIUM 80 MG PO TABS: 80.0000 mg | ORAL_TABLET | Freq: Every evening | ORAL | 2 refills | Status: DC

## 2023-01-23 NOTE — Progress Notes (Signed)
Pharmacy Note  01/23/2023 Name: [name] MRN: [id number] DOB: 1946/02/25  Subjective: [name] is a 77 y.o. year old female who is a primary care patient of [name], Emily Congress, DO. Clinical Pharmacist Practitioner referral was placed to assist with hypertension, hyperlipidemia diabetes and medication management.    Engaged with patient by telephone for follow up visit today.  Hypertension:  patient reports over the last few weeks her blood pressure at home has been slightly elevated at 145 or 147 / 70's. Last office blood pressure was at [place].    Current medications include: amlodipine 10mg  daily, losartan 100mg  daily and metoprolol ER 50mg  daily.  Patient has failed these meds in the past: lisinopril 40mg  - ineffective   BP Readings from Last 3 Encounters:  12/11/22 130/70  11/19/22 (!) 140/60  10/23/22 (!) 145/85    Type 2 DM, insulin dependent:   Last OV with Dr [name] was 12/11/2022.  Patient denies blood glucose < 80.  She is using Continuous Glucose Monitor - Dex Com 6 regularly to monitor blood glucose and endorses adherence to diabetes medications.   Current medications for DM: Tresiba 48 units daily, Jardiance 25mg  daily, Novolog 16 units with brunch and 18 units with supper with correction of 130/25 if needed.   Diet - patient has been trying to limit intake of sugary beverages and foods. She does report her granddaughter gave her a box of oatmeal cakes for her birthday but was would only eat 1/4 to 1/2 at a time.  Exercise - active but not participating in any formal exercise currently.    Hyperlipidemia:  Patient is taking atorvastatin 40mg  daily.  Past medications: rosuvastatin - cause palpitations.   Last LDL was 89 and Tg 157  Medication Management:  Patient uses [facility] and adherence packaging. Receives delivery every 3 months. Last received delivery of packaging 11/01/2022 and started pack on 11/08/2022. She will be due  refills in about 1to 2  weeks.   Objective: Review of patient status, including review of consultants reports, laboratory and other test data, was performed as part of comprehensive evaluation and provision of chronic care management services.   Lab Results  Component Value Date   CREATININE 1.04 11/19/2022   CREATININE 1.19 (H) 05/18/2022   CREATININE 0.88 12/21/2021    Lab Results  Component Value Date   HGBA1C 8.1 (A) 12/11/2022       Component Value Date/Time   CHOL 156 11/19/2022 1101   TRIG 157.0 (H) 11/19/2022 1101   HDL 36.00 (L) 11/19/2022 1101   CHOLHDL 4 11/19/2022 1101   VLDL 31.4 11/19/2022 1101   LDLCALC 89 11/19/2022 1101   LDLCALC 76 05/18/2022 1433   LDLDIRECT 53.0 04/24/2021 1542     Clinical ASCVD: Yes  The 10-year ASCVD risk score ([name], et al., 2019) is: 24.9%   Values used to calculate the score:     Age: 23 years     Sex: Female     Is Non-Hispanic African American: Yes     Diabetic: Yes     Tobacco smoker: No     Systolic Blood Pressure: 130 mmHg     Is BP treated: Yes     HDL Cholesterol: 36 mg/dL     Total Cholesterol: 156 mg/dL    BP Readings from Last 3 Encounters:  12/11/22 130/70  11/19/22 (!) 140/60  10/23/22 (!) 145/85     Allergies  Allergen Reactions   Metformin And  Related Other (See Comments)    Sick on stomach, sweaty, throw up, pain   Levemir [Insulin Detemir] Hives and Swelling   Clindamycin Nausea And Vomiting   Crestor [Rosuvastatin Calcium] Palpitations    Medications Reviewed Today     Reviewed by [name] (Pharmacist) on 01/23/23 at 1348  Med List Status: <None>   Medication Order Taking? Sig Documenting Provider Last Dose Status Informant  Alcohol Swabs (B-D SINGLE USE SWABS REGULAR) PADS [id number] Yes USE AS DIRECTED [name], Emily Congress, DO Taking Active   alendronate (FOSAMAX) 70 MG tablet [id number] Yes TAKE ONE TABLET BY MOUTH ONCE WEEKLY ON WEDNESDAY Emily Schultz, DO Taking  Active   ALPRAZolam Prudy Feeler) 0.25 MG tablet [id number] Yes Take 1 tablet by mouth three times a day if needed Emily Schultz, DO Taking Active   amLODipine (NORVASC) 10 MG tablet [id number] Yes TAKE ONE TABLET BY MOUTH ONCE DAILY Emily Schultz, DO Taking Active   aspirin EC 81 MG tablet [id number] Yes Take 81 mg by mouth daily. [provider] Taking Active Self  atorvastatin (LIPITOR) 40 MG tablet [id number] Yes TAKE ONE TABLET BY MOUTH EVERY EVENING Emily Schultz, DO Taking Active   Carboxymethylcellulose Sodium (THERATEARS OP) [id number] Yes Place 1 drop into both eyes daily as needed (dry eyes). [provider] Taking Active Self  Continuous Blood Gluc Sensor (DEXCOM G6 SENSOR) MISC [id number] Yes 1 Device by Does not apply route as directed. Nicholson, Emily Dolores, MD Taking Active   empagliflozin (JARDIANCE) 25 MG TABS tablet [id number] Yes Take 1 tablet (25 mg total) by mouth daily before breakfast. Nicholson, Emily Dolores, MD Taking Active   fenofibrate 160 MG tablet [id number] Yes TAKE ONE TABLET BY MOUTH EVERY EVENING Emily Schultz, DO Taking Active   ferrous sulfate 325 (65 FE) MG tablet [id number] Yes Take 325 mg by mouth daily with breakfast.  [provider] Taking Active Self  fexofenadine (ALLEGRA) 180 MG tablet [id number] Yes Take 180 mg by mouth daily as needed for allergies or rhinitis. [provider] Taking Active   FLUoxetine (PROZAC) 20 MG capsule [id number] Yes TAKE THREE CAPSULES BY MOUTH ONCE DAILY [name], Emily Congress, DO Taking Active   fluticasone (FLONASE) 50 MCG/ACT nasal spray [id number] Yes INSTILL 2 SPRAYS IN [facility] NOSTRIL DAILY Seabron Spates R, DO Taking Active   glucose blood (TRUE METRIX BLOOD GLUCOSE TEST) test strip [id number] No USE AS DIRECTED 4 TIMES A DAY  Patient not taking: Reported on 01/23/2023   Emily Schultz, DO Not Taking Active   insulin aspart (NOVOLOG FLEXPEN) 100 UNIT/ML  FlexPen [id number] Yes Max daily 70 units Nicholson, Emily Dolores, MD Taking Active   insulin degludec (TRESIBA FLEXTOUCH) 100 UNIT/ML FlexTouch Pen [id number] Yes Inject 48 Units into the skin daily. Please deliver out to patient by 07/25/2022 [name], Emily Congress, DO Taking Active   Insulin Pen Needle (COMFORT EZ PEN NEEDLES) 32G X 4 MM MISC [id number] Yes USE AS DIRECTED TWICE DAILY Nicholson, Emily Dolores, MD Taking Active   metoprolol succinate (TOPROL-XL) 50 MG 24 hr tablet [id number] Yes TAKE ONE TABLET BY MOUTH ONCE DAILY WITH FOOD [name], Emily Congress, DO Taking Active   Multiple Vitamin (MULTIVITAMIN WITH MINERALS) TABS [id number] Yes Take 1 tablet by mouth daily. [provider] Taking Active Self  Omega-3 Fatty Acids (FISH OIL) 1000 MG CAPS [id number] Yes Take 1 capsule by mouth in the morning and at bedtime.  [provider] Taking Active            Med Note Morrie Sheldon, Reyne Dumas Oct 14, 2020  9:11 AM)    pantoprazole (PROTONIX) 40 MG tablet [id number] Yes Take 1 tablet (40 mg total) by mouth daily. Emily Schultz, DO Taking Active   valsartan (DIOVAN) 320 MG tablet [id number] Yes Take 1 tablet (320 mg total) by mouth daily. Seabron Spates R, DO Taking Active   vitamin E 400 UNIT capsule [id number] Yes Take 400 Units by mouth daily. [provider] Taking Active Self            Patient Active Problem List   Diagnosis Date Noted   Anxiety 11/19/2022   Pain of right heel 11/19/2022   Radiculopathy, lumbar region 05/31/2022   Primary hypertension 09/18/2021   Pain of left calf 09/18/2021   Depression with anxiety 06/21/2021   Diabetes mellitus 01/17/2021   Type 2 diabetes mellitus with hyperglycemia, without long-term current use of insulin 01/17/2021   Acute pain of right shoulder 08/18/2020   Primary osteoarthritis of right knee 08/18/2020   Impacted cerumen of right ear 05/24/2020   Bilateral impacted cerumen 05/17/2020   Hyperlipidemia  associated with type 2 diabetes mellitus 03/10/2020   Cough 03/10/2020   Seasonal allergies 03/10/2020   Elevated rheumatoid factor 07/21/2018   Gastroenteritis 07/21/2018   Trigger middle finger of left hand 12/19/2017   Ganglion cyst of dorsum of right wrist 12/19/2017   Morton's neuroma of right foot 12/19/2017   Preventative health care 12/13/2017   Pain in both hands 12/13/2017   Hyperlipidemia LDL goal <100 12/13/2017   Abscess of upper gum 12/13/2017   Spinal stenosis of cervical region 09/25/2016   Cervical radiculopathy 09/06/2016   Dyspnea 02/18/2016   Chest pain 02/18/2016   HLD (hyperlipidemia) 02/18/2016   CAD in native artery 02/18/2016   RLS (restless legs syndrome) 02/18/2016   Pancreatitis 02/18/2016   SOB (shortness of breath) 02/18/2016   Uncontrolled type 2 diabetes mellitus with complication    Tobacco abuse    Osteoarthritis of right knee 07/05/2015   Smoking 07/02/2013   Acute upper respiratory infections of unspecified site 07/02/2013   Flu vaccine need 07/02/2013   Multinodular goiter 04/16/2013   Bunion 07/07/2012   BACK PAIN, THORACIC REGION, RIGHT 05/24/2009   IRON DEFICIENCY 03/25/2009   GERD 03/25/2009   WEIGHT LOSS 03/25/2009   SINUSITIS- ACUTE-NOS 11/25/2008   Hyperlipidemia LDL goal <70 10/07/2008   ACUTE PANCREATITIS 09/07/2008   OVARIAN CYST 09/07/2008   KNEE PAIN, RIGHT 05/24/2008   CARPAL TUNNEL SYNDROME, RIGHT 05/12/2008   Acute pain of both shoulders 04/02/2008   ROTATOR CUFF REPAIR, RIGHT, HX OF 03/29/2008   GOITER, MULTINODULAR 12/26/2007   Depression 07/07/2007   HIP PAIN, RIGHT 03/11/2007   RESTLESS LEG SYNDROME, HX OF 03/11/2007   Diabetes mellitus type II, uncontrolled (HCC) 03/07/2007   Essential hypertension 03/07/2007     Medication Assistance:  None required.  Patient affirms current coverage meets needs. Approved for LIS for 2024 as well.    Assessment / Plan:    Type 2 DM - A1c is improving but not at goal  yet. Continue current therapy for diabetes and continue to follow up with Dr [name]. Continue to use DexCom 6 to continuously check blood glucose.  Hypertension: home blood pressure has been a little elevated but office blood pressure was at goal. Continue valsartan  daily, amlodipine  daily and metoprolol ER daily  Encouraged her to start daily exercise - walking is great, start with 5 to 10 minutes per day and work up as able to goal of 150 minutes per week.   Hyperlipidemia: LDL not at goal of < 70 Increased atorvastatin to 80mg  daily.  Daily exercise will help with cholesterol as well.   Medication management:  Coordinated with pharmacy regarding upcoming packaging changes and delivery.  Patient did mention that she has a high bill at her pharmacy - I checked to make sure all her prescriptions were being run thru insurance and that LIS price was being used - she is getting branded meds for $11.20 and generic for either $0 or $4.   Follow Up:  Telephone follow up appointment with care management team member scheduled for:  3 months.    [name], PharmD Clinical Pharmacist [facility]  - [facility] [phone]

## 2023-01-25 ENCOUNTER — Encounter: Payer: Self-pay | Admitting: Physician Assistant

## 2023-01-25 ENCOUNTER — Ambulatory Visit: Payer: Medicare HMO | Admitting: Physician Assistant

## 2023-01-25 DIAGNOSIS — K30 Functional dyspepsia: Secondary | ICD-10-CM | POA: Diagnosis not present

## 2023-01-25 DIAGNOSIS — R197 Diarrhea, unspecified: Secondary | ICD-10-CM | POA: Diagnosis not present

## 2023-01-25 DIAGNOSIS — Z8619 Personal history of other infectious and parasitic diseases: Secondary | ICD-10-CM | POA: Diagnosis not present

## 2023-01-25 NOTE — Progress Notes (Signed)
Chief Complaint: Gas, bloating and reflux  HPI:    [name] is a 77 year old African-American female with a past medical history as listed below including hypertension and diabetes, known to Dr. Christella Hartigan, who was referred to me by [name], [name], * for a complaint of gas, bloating and reflux.      08/08/2021 patient seen in clinic by Dr. Christella Hartigan for discussion of colon cancer screening.  Patient wanted to go ahead with Cologuard screening.    08/23/2021 Cologuard negative.    11/19/2022 patient had H. pylori antibody IgG which was positive.  CMP at that time with glucose elevated to 71 and BUN elevated at 24.  CBC normal.  Patient started on therapy with Protonix twice daily for 2 weeks and Clindamycin 500 mg twice daily x 2 weeks as well as Amoxicillin 500 mg 2 p.o. twice daily x 2 weeks and Flagyl 500 mg twice daily x 2 weeks.  Patient was also referred to [name].    Today, the patient presents to clinic and tells me that she was treated for H. pylori and this helped her indigestion a little bit in that it does not occur quite as often.  She continues Pantoprazole 40 mg daily.  More concerning to her today is that she feels like she has had some increase in loose stool that even "comes out when I do not know it", apparently was occasionally maybe once every 4 to 5 months before and now she feels like it happens more often.  She does think it correlates anytime that she has eggs and diet Pepsi which she typically has for breakfast every morning.  She has tried to lay off the diet Pepsi but tells me it is really hard for her.  She has even tried to water it down which seems to help with the diarrhea for a while but then it comes back.    Denies fever, chills, weight loss, blood in her stool, nausea or vomiting.  Past Medical History:  Diagnosis Date   Blockage of coronary artery of heart (HCC)    Depression    Diabetes mellitus    Hyperlipidemia    Hypertension    Pancreatitis    Restless leg  syndrome     Past Surgical History:  Procedure Laterality Date   CATARACT EXTRACTION, BILATERAL     CORONARY ANGIOPLASTY WITH STENT PLACEMENT     FOOT SURGERY Right    cyst removal   ROTATOR CUFF REPAIR     right   ROTATOR CUFF REPAIR Left    TUBAL LIGATION      Current Outpatient Medications  Medication Sig Dispense Refill   Alcohol Swabs (B-D SINGLE USE SWABS REGULAR) PADS USE AS DIRECTED 400 each 1   alendronate (FOSAMAX) 70 MG tablet TAKE ONE TABLET BY MOUTH ONCE WEEKLY ON WEDNESDAY 12 tablet 3   ALPRAZolam (XANAX) 0.25 MG tablet Take 1 tablet by mouth three times a day if needed 60 tablet 0   amLODipine (NORVASC) 10 MG tablet TAKE ONE TABLET BY MOUTH ONCE DAILY 90 tablet 1   aspirin EC 81 MG tablet Take 81 mg by mouth daily.     atorvastatin (LIPITOR) 80 MG tablet Take 1 tablet (80 mg total) by mouth every evening. 90 tablet 2   Carboxymethylcellulose Sodium (THERATEARS OP) Place 1 drop into both eyes daily as needed (dry eyes).     Continuous Blood Gluc Sensor (DEXCOM G6 SENSOR) MISC 1 Device by Does not apply route  as directed. 9 each 3   empagliflozin (JARDIANCE) 25 MG TABS tablet Take 1 tablet (25 mg total) by mouth daily before breakfast. 90 tablet 3   fenofibrate 160 MG tablet TAKE ONE TABLET BY MOUTH EVERY EVENING 90 tablet 1   ferrous sulfate 325 (65 FE) MG tablet Take 325 mg by mouth daily with breakfast.      fexofenadine (ALLEGRA) 180 MG tablet Take 180 mg by mouth daily as needed for allergies or rhinitis.     FLUoxetine (PROZAC) 20 MG capsule TAKE THREE CAPSULES BY MOUTH ONCE DAILY 270 capsule 3   fluticasone (FLONASE) 50 MCG/ACT nasal spray INSTILL 2 SPRAYS IN EACH NOSTRIL DAILY 48 g 1   glucose blood (TRUE METRIX BLOOD GLUCOSE TEST) test strip USE AS DIRECTED 4 TIMES A DAY (Patient not taking: Reported on 01/23/2023) 400 strip 1   insulin aspart (NOVOLOG FLEXPEN) 100 UNIT/ML FlexPen Max daily 70 units 60 mL 6   insulin degludec (TRESIBA FLEXTOUCH) 100 UNIT/ML  FlexTouch Pen Inject 48 Units into the skin daily. Please deliver out to patient by 07/25/2022 45 mL 0   Insulin Pen Needle (COMFORT EZ PEN NEEDLES) 32G X 4 MM MISC USE AS DIRECTED TWICE DAILY 200 each 5   metoprolol succinate (TOPROL-XL) 50 MG 24 hr tablet TAKE ONE TABLET BY MOUTH ONCE DAILY WITH FOOD 90 tablet 1   Multiple Vitamin (MULTIVITAMIN WITH MINERALS) TABS Take 1 tablet by mouth daily.     Omega-3 Fatty Acids (FISH OIL) 1000 MG CAPS Take 1 capsule by mouth in the morning and at bedtime.     pantoprazole (PROTONIX) 40 MG tablet Take 1 tablet (40 mg total) by mouth daily. 90 tablet 1   valsartan (DIOVAN) 320 MG tablet TAKE ONE TABLET BY MOUTH EVERY MORNING 90 tablet 0   vitamin E 400 UNIT capsule Take 400 Units by mouth daily.     No current facility-administered medications for this visit.    Allergies as of 01/25/2023 - Review Complete 01/23/2023  Allergen Reaction Noted   Metformin and related Other (See Comments) 10/23/2012   Levemir [insulin detemir] Hives and Swelling 01/05/2011   Clindamycin Nausea And Vomiting 11/24/2020   Crestor [rosuvastatin calcium] Palpitations 07/17/2021    Family History  Problem Relation Age of Onset   Heart attack Father    Heart attack Brother    Diabetes Sister    Cirrhosis Sister    Hypertension Sister    Cancer Mother    Hypertension Mother    Vision loss Mother    Cancer Sister    Sleep apnea Sister    Heart Problems Sister    Diabetes Sister    Gout Sister    Diabetes Son    Heart attack Son    Diabetes Daughter    Goiter Daughter    Diabetes Daughter     Social History   Socioeconomic History   Marital status: Married    Spouse name: Not on file   Number of children: Not on file   Years of education: Not on file   Highest education level: Not on file  Occupational History   Not on file  Tobacco Use   Smoking status: Former    Packs/day: .2    Types: Cigarettes    Quit date: 05/05/2020    Years since quitting: 2.7    Smokeless tobacco: Never   Tobacco comments:    quit in 2021  Vaping Use   Vaping Use: Never used  Substance and  Sexual Activity   Alcohol use: No   Drug use: No   Sexual activity: Not Currently  Other Topics Concern   Not on file  Social History Narrative   Exercise: seldom   Caffeine use: seldom   Social Determinants of Health   Financial Resource Strain: Low Risk  (10/24/2022)   Overall Financial Resource Strain (CARDIA)    Difficulty of Paying Living Expenses: Not hard at all  Recent Concern: Financial Resource Strain - Medium Risk (10/23/2022)   Overall Financial Resource Strain (CARDIA)    Difficulty of Paying Living Expenses: Somewhat hard  Food Insecurity: No Food Insecurity (10/24/2022)   Hunger Vital Sign    Worried About Running Out of Food in the Last Year: Never true    Ran Out of Food in the Last Year: Never true  Transportation Needs: No Transportation Needs (10/24/2022)   PRAPARE - Administrator, Civil Service (Medical): No    Lack of Transportation (Non-Medical): No  Physical Activity: Inactive (10/24/2022)   Exercise Vital Sign    Days of Exercise per Week: 0 days    Minutes of Exercise per Session: 0 min  Stress: No Stress Concern Present (10/24/2022)   [employer] of Occupational Health - Occupational Stress Questionnaire    Feeling of Stress : Not at all  Social Connections: Moderately Integrated (10/24/2022)   Social Connection and Isolation Panel [NHANES]    Frequency of Communication with Friends and Family: More than three times a week    Frequency of Social Gatherings with Friends and Family: More than three times a week    Attends Religious Services: Never    Database administrator or Organizations: Yes    Attends Banker Meetings: 1 to 4 times per year    Marital Status: Married  Catering manager Violence: Not At Risk (10/24/2022)   Humiliation, Afraid, Rape, and Kick questionnaire    Fear of Current or Ex-Partner:  No    Emotionally Abused: No    Physically Abused: No    Sexually Abused: No    Review of Systems:    Constitutional: No weight loss, fever or chills Cardiovascular: No chest pain Respiratory: No SOB  Gastrointestinal: See HPI and otherwise negative   Physical Exam:  Vital signs: BP (!) 150/60 (BP Location: Left Arm, Patient Position: Sitting, Cuff Size: Normal)   Pulse 64   Ht 5\' 4"  (1.626 m)   Wt 176 lb (79.8 kg)   BMI 30.21 kg/m    Constitutional:   Pleasant Elderly AA female appears to be in NAD, Well developed, Well nourished, alert and cooperative Respiratory: Respirations even and unlabored. Lungs clear to auscultation bilaterally.   No wheezes, crackles, or rhonchi.  Cardiovascular: Normal S1, S2. No MRG. Regular rate and rhythm. No peripheral edema, cyanosis or pallor.  Gastrointestinal:  Soft, nondistended, nontender. No rebound or guarding. Normal bowel sounds. No appreciable masses or hepatomegaly. Rectal:  Not performed.  Psychiatric: Oriented to person, place and time. Demonstrates good judgement and reason without abnormal affect or behaviors.  RELEVANT LABS AND IMAGING: CBC    Component Value Date/Time   WBC 7.8 11/19/2022 1101   RBC 4.42 11/19/2022 1101   HGB 13.2 11/19/2022 1101   HCT 39.6 11/19/2022 1101   PLT 221.0 11/19/2022 1101   MCV 89.7 11/19/2022 1101   MCH 30.6 05/18/2022 1433   MCHC 33.2 11/19/2022 1101   RDW 13.9 11/19/2022 1101   LYMPHSABS 2.8 11/19/2022 1101   MONOABS 0.6  11/19/2022 1101   EOSABS 0.0 11/19/2022 1101   BASOSABS 0.0 11/19/2022 1101    CMP     Component Value Date/Time   NA 139 11/19/2022 1101   K 4.5 11/19/2022 1101   CL 101 11/19/2022 1101   CO2 29 11/19/2022 1101   GLUCOSE 271 (H) 11/19/2022 1101   BUN 24 (H) 11/19/2022 1101   CREATININE 1.04 11/19/2022 1101   CREATININE 1.19 (H) 05/18/2022 1433   CALCIUM 10.5 11/19/2022 1101   PROT 6.5 11/19/2022 1101   ALBUMIN 4.3 11/19/2022 1101   AST 18 11/19/2022 1101    ALT 20 11/19/2022 1101   ALKPHOS 36 (L) 11/19/2022 1101   BILITOT 0.6 11/19/2022 1101   GFRNONAA >60 09/23/2020 1438   GFRAA >60 02/18/2016 1001    Assessment: 1.  History of H. pylori: Treated with quadruple therapy by PCP back in February, we will retest her now 2.  Indigestion: Chronic reflux for the patient with some increase after diagnosis of above 3.  Loose stools/fecal incontinence: Increased in frequency over the past 4 months or so per patient, seems to correlate with anytime she drinks diet Pepsi and has eggs for breakfast; consider relation to sugar substitute or's other  Plan: 1.  Discussed with patient that there is a possibility that the diet Pepsi is giving her diarrhea given that it has fake sugar in it.  Would recommend that she lay off of this and the eggs for 2 weeks to see if she has any trouble with loose stools in that timeframe.  If she does not at least she knows this is what is giving her diarrhea and she can choose to have it or not.  Patient tells me this will likely be very hard for her. 2.  Discussed H. pylori.  We need to retest her for this and ensure that she is negative given that she still has some indigestion issues.  Ordered [facility] H. pylori testing. 3.  Continue Pantoprazole 40 mg daily for now.  Discussed that if H. pylori testing is negative then would recommend she increase this medication for a period of time to see if it helps with indigestion. 4.  Patient to follow in clinic per recommendations after testing above. Note sent to Dr. Chales Abrahams today in lieu of Dr. Christella Hartigan absence.  Hyacinth Meeker, PA-C [facility] 01/25/2023, 1:58 PM  Cc: [name], [name], *

## 2023-01-25 NOTE — Patient Instructions (Addendum)
_______________________________________________________  If your blood pressure at your visit was 140/90 or greater, please contact your primary care physician to follow up on this. _______________________________________________________  If you are age 77 or older, your body mass index should be between 23-30. Your Body mass index is 30.21 kg/m. If this is out of the aforementioned range listed, please consider follow up with your Primary Care Provider. ________________________________________________________  The [facility] providers would like to encourage you to use [facility] to communicate with providers for non-urgent requests or questions.  Due to long hold times on the telephone, sending your provider a message by [facility] may be a faster and more efficient way to get a response.  Please allow 48 business hours for a response.  Please remember that this is for non-urgent requests.  _______________________________________________________  Your provider has ordered "[employer]" stool testing for you. You have received a kit from our office today containing all necessary supplies to complete this test. Please carefully read the stool collection instructions provided in the kit before opening the accompanying materials. In addition, be sure to place the label from the top left corner of the laboratory request sheet onto the "puritan opti-swab" tube that is supplied in the kit. This label should include your full name and date of birth. After completing the test, you should secure the purtian tube into the specimen biohazard bag. The laboratory request information sheet (including date and time of specimen collection) should be placed into the outside pocket of the specimen biohazard bag and returned to the [place] lab with 2 days of collection.   Please try and stop drinking diet pepsi and consuming eggs for 2 weeks.   Thank you for entrusting me with your care and choosing [facility].  Hyacinth Meeker, PA-C

## 2023-01-29 DIAGNOSIS — Z8619 Personal history of other infectious and parasitic diseases: Secondary | ICD-10-CM | POA: Diagnosis not present

## 2023-01-30 ENCOUNTER — Ambulatory Visit: Payer: Medicare HMO | Admitting: Podiatry

## 2023-01-30 NOTE — Progress Notes (Signed)
Agree with assessment/plan.  Raj Lesslie Mckeehan, MD [facility] [phone]  

## 2023-02-04 ENCOUNTER — Telehealth: Payer: Self-pay

## 2023-02-04 ENCOUNTER — Other Ambulatory Visit: Payer: Self-pay | Admitting: Physician Assistant

## 2023-02-04 NOTE — Telephone Encounter (Signed)
Spoke with pt and she is aware of results and recommendations per Hyacinth Meeker PA.

## 2023-02-04 NOTE — Progress Notes (Signed)
02/04/2023 2:17 PM  Diatherix labs received.  H. pylori negative.  We will let patient know.  Likely it is just the Pepsi she is drinking causing diarrhea.  Hyacinth Meeker, PA-C

## 2023-02-04 NOTE — Telephone Encounter (Signed)
-----   Message from [facility] [place], [place] sent at 02/04/2023  2:18 PM EDT ----- Regarding: Negative H. pylori testing Please let patient know that her tests were negative for H. pylori.  Likely her diarrhea is related to diet Pepsi which we discussed in clinic.  Thanks, [employer]

## 2023-02-14 ENCOUNTER — Encounter: Payer: Self-pay | Admitting: Physician Assistant

## 2023-02-19 ENCOUNTER — Other Ambulatory Visit: Payer: Self-pay | Admitting: Family Medicine

## 2023-02-19 DIAGNOSIS — E1165 Type 2 diabetes mellitus with hyperglycemia: Secondary | ICD-10-CM

## 2023-02-21 ENCOUNTER — Telehealth: Payer: Self-pay | Admitting: Family Medicine

## 2023-02-21 NOTE — Telephone Encounter (Signed)
Pt would like call back about a prescription.

## 2023-02-21 NOTE — Telephone Encounter (Signed)
Patient has a question about her Jardiance. She thought that Jardiance was suppose to suppress her appetite. Explained to patient that [name] can sometimes help patient's lose a few pounds but they is does not usually affect appetite.  Discussed that medications like [name], [name], [name] and Mounjaro do but that since she has history of pancreatitis, I would not recommend these medications. She should discuss with [name].   I asked if she was having any low blood glucose readings as this can sometimes make patient's feel hungry and she might need to talk with [name] about adjusting her insulin regimen. [name] states that she does occasionally have lows but does not feel that these are the cause of her increased appetite because she has felt hungry even when blood glucose was in the 150's to 180's.   There are some medications that she could take like [name] but it is not covered by Medicare. Some patient's see improved appetite with [name] which is 1 part of [name].  Will get PCPs opinion on this. Patient is taking [name] and might need to adjust dose if [name] is started.

## 2023-02-22 ENCOUNTER — Ambulatory Visit: Payer: Medicare HMO | Admitting: Podiatry

## 2023-02-25 MED ORDER — BUPROPION HCL ER (XL) 150 MG PO TB24: 150.0000 mg | ORAL_TABLET | ORAL | 2 refills | Status: DC

## 2023-02-25 NOTE — Addendum Note (Signed)
Addended by: [name] B on: 02/25/2023 04:34 PM   Modules accepted: Orders

## 2023-04-02 DIAGNOSIS — E1165 Type 2 diabetes mellitus with hyperglycemia: Secondary | ICD-10-CM | POA: Diagnosis not present

## 2023-04-15 ENCOUNTER — Ambulatory Visit (INDEPENDENT_AMBULATORY_CARE_PROVIDER_SITE_OTHER): Payer: Medicare HMO | Admitting: Family Medicine

## 2023-04-15 ENCOUNTER — Encounter: Payer: Self-pay | Admitting: Family Medicine

## 2023-04-15 VITALS — BP 120/70 | HR 61 | Temp 97.8°F | Resp 18 | Ht 64.0 in | Wt 178.2 lb

## 2023-04-15 DIAGNOSIS — E1169 Type 2 diabetes mellitus with other specified complication: Secondary | ICD-10-CM | POA: Diagnosis not present

## 2023-04-15 DIAGNOSIS — I10 Essential (primary) hypertension: Secondary | ICD-10-CM

## 2023-04-15 DIAGNOSIS — H9193 Unspecified hearing loss, bilateral: Secondary | ICD-10-CM | POA: Diagnosis not present

## 2023-04-15 DIAGNOSIS — Z7984 Long term (current) use of oral hypoglycemic drugs: Secondary | ICD-10-CM | POA: Diagnosis not present

## 2023-04-15 DIAGNOSIS — E785 Hyperlipidemia, unspecified: Secondary | ICD-10-CM

## 2023-04-15 NOTE — Assessment & Plan Note (Signed)
hgba1c to be checked, minimize simple carbs. Increase exercise as tolerated. Continue current meds  

## 2023-04-15 NOTE — Assessment & Plan Note (Signed)
Refer to audiology.

## 2023-04-15 NOTE — Progress Notes (Signed)
Established Patient Office Visit  Subjective   Patient ID: [name], female    DOB: 03-18-1946  Age: 77 y.o. MRN: [id number]  Chief Complaint  Patient presents with   Hearing Problem    Both ears, pt states going on a long time. Pt states no pain just trouble hearing     HPI Discussed the use of AI scribe software for clinical note transcription with the patient, who gave verbal consent to proceed.  History of Present Illness   The patient, with a history of diabetes, presents with longstanding bilateral hearing loss that has been worsening recently. They report difficulty hearing, especially in the presence of background noise, and frequently have to ask others to repeat themselves. They deny any associated symptoms such as tinnitus or vertigo. They have not seen an audiologist in several years. The patient also mentions that they have been drinking plenty of water.      Patient Active Problem List   Diagnosis Date Noted   Bilateral hearing loss 04/15/2023   Anxiety 11/19/2022   Pain of right heel 11/19/2022   Radiculopathy, lumbar region 05/31/2022   Primary hypertension 09/18/2021   Pain of left calf 09/18/2021   Depression with anxiety 06/21/2021   Diabetes mellitus (HCC) 01/17/2021   Type 2 diabetes mellitus with hyperglycemia, without long-term current use of insulin (HCC) 01/17/2021   Acute pain of right shoulder 08/18/2020   Primary osteoarthritis of right knee 08/18/2020   Impacted cerumen of right ear 05/24/2020   Bilateral impacted cerumen 05/17/2020   Hyperlipidemia associated with type 2 diabetes mellitus (HCC) 03/10/2020   Cough 03/10/2020   Seasonal allergies 03/10/2020   Elevated rheumatoid factor 07/21/2018   Gastroenteritis 07/21/2018   Trigger middle finger of left hand 12/19/2017   Ganglion cyst of dorsum of right wrist 12/19/2017   Morton's neuroma of right foot 12/19/2017   Preventative health care 12/13/2017   Pain in both hands 12/13/2017    Hyperlipidemia LDL goal <100 12/13/2017   Abscess of upper gum 12/13/2017   Spinal stenosis of cervical region 09/25/2016   Cervical radiculopathy 09/06/2016   Dyspnea 02/18/2016   Chest pain 02/18/2016   HLD (hyperlipidemia) 02/18/2016   CAD in native artery 02/18/2016   RLS (restless legs syndrome) 02/18/2016   Pancreatitis 02/18/2016   SOB (shortness of breath) 02/18/2016   Uncontrolled type 2 diabetes mellitus with complication    Tobacco abuse    Osteoarthritis of right knee 07/05/2015   Smoking 07/02/2013   Acute upper respiratory infections of unspecified site 07/02/2013   Flu vaccine need 07/02/2013   Multinodular goiter 04/16/2013   Bunion 07/07/2012   BACK PAIN, THORACIC REGION, RIGHT 05/24/2009   IRON DEFICIENCY 03/25/2009   GERD 03/25/2009   WEIGHT LOSS 03/25/2009   SINUSITIS- ACUTE-NOS 11/25/2008   Hyperlipidemia LDL goal <70 10/07/2008   ACUTE PANCREATITIS 09/07/2008   OVARIAN CYST 09/07/2008   KNEE PAIN, RIGHT 05/24/2008   CARPAL TUNNEL SYNDROME, RIGHT 05/12/2008   Acute pain of both shoulders 04/02/2008   ROTATOR CUFF REPAIR, RIGHT, HX OF 03/29/2008   GOITER, MULTINODULAR 12/26/2007   Depression 07/07/2007   HIP PAIN, RIGHT 03/11/2007   RESTLESS LEG SYNDROME, HX OF 03/11/2007   Diabetes mellitus type II, uncontrolled (HCC) 03/07/2007   Essential hypertension 03/07/2007   Past Medical History:  Diagnosis Date   Blockage of coronary artery of heart (HCC)    Depression    Diabetes mellitus    Hyperlipidemia    Hypertension  Pancreatitis    Restless leg syndrome    Past Surgical History:  Procedure Laterality Date   CATARACT EXTRACTION, BILATERAL     CORONARY ANGIOPLASTY WITH STENT PLACEMENT     FOOT SURGERY Right    cyst removal   ROTATOR CUFF REPAIR     right   ROTATOR CUFF REPAIR Left    TUBAL LIGATION     Social History   Tobacco Use   Smoking status: Former    Packs/day: .2    Types: Cigarettes    Quit date: 05/05/2020    Years since  quitting: 2.9   Smokeless tobacco: Never   Tobacco comments:    quit in 2021  Vaping Use   Vaping Use: Never used  Substance Use Topics   Alcohol use: No   Drug use: No   Social History   Socioeconomic History   Marital status: Married    Spouse name: Not on file   Number of children: Not on file   Years of education: Not on file   Highest education level: Not on file  Occupational History   Not on file  Tobacco Use   Smoking status: Former    Packs/day: .2    Types: Cigarettes    Quit date: 05/05/2020    Years since quitting: 2.9   Smokeless tobacco: Never   Tobacco comments:    quit in 2021  Vaping Use   Vaping Use: Never used  Substance and Sexual Activity   Alcohol use: No   Drug use: No   Sexual activity: Not Currently  Other Topics Concern   Not on file  Social History Narrative   Exercise: seldom   Caffeine use: seldom   Social Determinants of Health   Financial Resource Strain: Low Risk  (10/24/2022)   Overall Financial Resource Strain (CARDIA)    Difficulty of Paying Living Expenses: Not hard at all  Recent Concern: Financial Resource Strain - Medium Risk (10/23/2022)   Overall Financial Resource Strain (CARDIA)    Difficulty of Paying Living Expenses: Somewhat hard  Food Insecurity: No Food Insecurity (10/24/2022)   Hunger Vital Sign    Worried About Running Out of Food in the Last Year: Never true    Ran Out of Food in the Last Year: Never true  Transportation Needs: No Transportation Needs (10/24/2022)   PRAPARE - Administrator, Civil Service (Medical): No    Lack of Transportation (Non-Medical): No  Physical Activity: Inactive (10/24/2022)   Exercise Vital Sign    Days of Exercise per Week: 0 days    Minutes of Exercise per Session: 0 min  Stress: No Stress Concern Present (10/24/2022)   [employer] of Occupational Health - Occupational Stress Questionnaire    Feeling of Stress : Not at all  Social Connections: Moderately  Integrated (10/24/2022)   Social Connection and Isolation Panel [NHANES]    Frequency of Communication with Friends and Family: More than three times a week    Frequency of Social Gatherings with Friends and Family: More than three times a week    Attends Religious Services: Never    Database administrator or Organizations: Yes    Attends Banker Meetings: 1 to 4 times per year    Marital Status: Married  Catering manager Violence: Not At Risk (10/24/2022)   Humiliation, Afraid, Rape, and Kick questionnaire    Fear of Current or Ex-Partner: No    Emotionally Abused: No    Physically Abused:  No    Sexually Abused: No   Family Status  Relation Name Status   Father  Deceased   Sister  Alive   Brother  Deceased at age 6   Sister  Alive   Sister  Alive       neck/spine problems    Brother  Deceased       truck accident    Mother  Alive   Sister  Alive       blood clots   Sister  Deceased   Brother  Alive       blood clots   Son  Alive   Son  Alive   Son  Alive   Daughter  Alive   Daughter  Alive       back problems    Family History  Problem Relation Age of Onset   Heart attack Father    Heart attack Brother    Diabetes Sister    Cirrhosis Sister    Hypertension Sister    Cancer Mother    Hypertension Mother    Vision loss Mother    Cancer Sister    Sleep apnea Sister    Heart Problems Sister    Diabetes Sister    Gout Sister    Diabetes Son    Heart attack Son    Diabetes Daughter    Goiter Daughter    Diabetes Daughter    Allergies  Allergen Reactions   Metformin And Related Other (See Comments)    Sick on stomach, sweaty, throw up, pain   Levemir [Insulin Detemir] Hives and Swelling   Clindamycin Nausea And Vomiting   Crestor [Rosuvastatin Calcium] Palpitations      Review of Systems  Constitutional:  Negative for fever and malaise/fatigue.  HENT:  Negative for congestion.   Eyes:  Negative for blurred vision.  Respiratory:  Negative  for cough and shortness of breath.   Cardiovascular:  Negative for chest pain, palpitations and leg swelling.  Gastrointestinal:  Negative for abdominal pain, blood in stool, nausea and vomiting.  Genitourinary:  Negative for dysuria and frequency.  Musculoskeletal:  Negative for back pain and falls.  Skin:  Negative for rash.  Neurological:  Negative for dizziness, loss of consciousness and headaches.  Endo/Heme/Allergies:  Negative for environmental allergies.  Psychiatric/Behavioral:  Negative for depression. The patient is not nervous/anxious.       Objective:     BP 120/70 (BP Location: Right Arm, Patient Position: Sitting, Cuff Size: Normal)   Pulse 61   Temp 97.8 F (36.6 C) (Oral)   Resp 18   Ht 5\' 4"  (1.626 m)   Wt 178 lb 3.2 oz (80.8 kg)   SpO2 99%   BMI 30.59 kg/m  BP Readings from Last 3 Encounters:  04/15/23 120/70  01/25/23 (!) 150/60  12/11/22 130/70   Wt Readings from Last 3 Encounters:  04/15/23 178 lb 3.2 oz (80.8 kg)  01/25/23 176 lb (79.8 kg)  12/11/22 174 lb (78.9 kg)   SpO2 Readings from Last 3 Encounters:  04/15/23 99%  12/11/22 94%  11/19/22 99%      Physical Exam Vitals and nursing note reviewed.  Constitutional:      General: She is not in acute distress.    Appearance: Normal appearance. She is well-developed.  HENT:     Head: Normocephalic and atraumatic.     Right Ear: Tympanic membrane normal. Decreased hearing noted. There is no impacted cerumen. Tympanic membrane is not injected or erythematous. Tympanic  membrane has normal mobility.     Left Ear: Tympanic membrane normal. Decreased hearing noted. There is no impacted cerumen. Tympanic membrane is not injected or erythematous. Tympanic membrane has normal mobility.  Eyes:     General: No scleral icterus.       Right eye: No discharge.        Left eye: No discharge.  Cardiovascular:     Rate and Rhythm: Normal rate and regular rhythm.     Heart sounds: No murmur  heard. Pulmonary:     Effort: Pulmonary effort is normal. No respiratory distress.     Breath sounds: Normal breath sounds.  Musculoskeletal:        General: Normal range of motion.     Cervical back: Normal range of motion and neck supple.     Right lower leg: No edema.     Left lower leg: No edema.  Skin:    General: Skin is warm and dry.  Neurological:     General: No focal deficit present.     Mental Status: She is alert and oriented to person, place, and time.  Psychiatric:        Mood and Affect: Mood normal.        Behavior: Behavior normal.        Thought Content: Thought content normal.        Judgment: Judgment normal.      No results found for any visits on 04/15/23.  Last CBC Lab Results  Component Value Date   WBC 7.8 11/19/2022   HGB 13.2 11/19/2022   HCT 39.6 11/19/2022   MCV 89.7 11/19/2022   MCH 30.6 05/18/2022   RDW 13.9 11/19/2022   PLT 221.0 11/19/2022   Last metabolic panel Lab Results  Component Value Date   GLUCOSE 271 (H) 11/19/2022   NA 139 11/19/2022   K 4.5 11/19/2022   CL 101 11/19/2022   CO2 29 11/19/2022   BUN 24 (H) 11/19/2022   CREATININE 1.04 11/19/2022   GFR 52.08 (L) 11/19/2022   CALCIUM 10.5 11/19/2022   PHOS 5.0 (H) 03/03/2009   PROT 6.5 11/19/2022   ALBUMIN 4.3 11/19/2022   BILITOT 0.6 11/19/2022   ALKPHOS 36 (L) 11/19/2022   AST 18 11/19/2022   ALT 20 11/19/2022   ANIONGAP 13 09/23/2020   Last lipids Lab Results  Component Value Date   CHOL 156 11/19/2022   HDL 36.00 (L) 11/19/2022   LDLCALC 89 11/19/2022   LDLDIRECT 53.0 04/24/2021   TRIG 157.0 (H) 11/19/2022   CHOLHDL 4 11/19/2022   Last hemoglobin A1c Lab Results  Component Value Date   HGBA1C 8.1 (A) 12/11/2022   Last thyroid functions Lab Results  Component Value Date   TSH 0.58 08/11/2018   Last vitamin D No results found for: "25OHVITD2", "25OHVITD3", "VD25OH" Last vitamin B12 and Folate Lab Results  Component Value Date   VITAMINB12 913  (H) 07/16/2018   FOLATE 11.2 08/18/2009      The 10-year ASCVD risk score (Arnett DK, et al., 2019) is: 22.6%    Assessment & Plan:   Problem List Items Addressed This Visit       Unprioritized   Hyperlipidemia associated with type 2 diabetes mellitus (HCC)    hgba1c to be checked, minimize simple carbs. Increase exercise as tolerated. Continue current meds       Relevant Orders   Comprehensive metabolic panel   Lipid panel   Essential hypertension    Well controlled, no changes  to meds. Encouraged heart healthy diet such as the DASH diet and exercise as tolerated.        Relevant Orders   Comprehensive metabolic panel   Lipid panel   Bilateral hearing loss - Primary    Refer to audiology      Relevant Orders   Ambulatory referral to Audiology  Assessment and Plan    Hearing Loss: Longstanding bilateral hearing loss, worsening recently. Ears are clean bilaterally. No history of Financial planner. Patient prefers in-ear hearing aids if needed. -Refer to Audiologist for comprehensive hearing evaluation.  Hyperlipidemia: Last cholesterol check was in summer 2023. -Order lipid panel today.  Follow-up in 6 months or sooner if needed.        No follow-ups on file.    Donato Schultz, DO

## 2023-04-15 NOTE — Assessment & Plan Note (Signed)
Well controlled, no changes to meds. Encouraged heart healthy diet such as the DASH diet and exercise as tolerated.  °

## 2023-04-16 LAB — COMPREHENSIVE METABOLIC PANEL
ALT: 22 U/L (ref 0–35)
AST: 22 U/L (ref 0–37)
Albumin: 4.3 g/dL (ref 3.5–5.2)
Alkaline Phosphatase: 38 U/L — ABNORMAL LOW (ref 39–117)
BUN: 22 mg/dL (ref 6–23)
CO2: 27 mEq/L (ref 19–32)
Calcium: 10.3 mg/dL (ref 8.4–10.5)
Chloride: 104 mEq/L (ref 96–112)
Creatinine, Ser: 1.2 mg/dL (ref 0.40–1.20)
GFR: 43.74 mL/min — ABNORMAL LOW (ref >60.00)
Glucose, Bld: 152 mg/dL — ABNORMAL HIGH (ref 70–99)
Potassium: 3.8 mEq/L (ref 3.5–5.1)
Sodium: 140 mEq/L (ref 135–145)
Total Bilirubin: 0.6 mg/dL (ref 0.2–1.2)
Total Protein: 6.6 g/dL (ref 6.0–8.3)

## 2023-04-16 LAB — LIPID PANEL
Cholesterol: 149 mg/dL (ref 0–200)
HDL: 31.1 mg/dL — ABNORMAL LOW (ref >39.00)
LDL Cholesterol: 80 mg/dL (ref 0–99)
NonHDL: 118.07
Total CHOL/HDL Ratio: 5
Triglycerides: 190 mg/dL — ABNORMAL HIGH (ref 0.0–149.0)
VLDL: 38 mg/dL (ref 0.0–40.0)

## 2023-04-29 ENCOUNTER — Other Ambulatory Visit: Payer: Self-pay | Admitting: Family Medicine

## 2023-04-29 ENCOUNTER — Ambulatory Visit (INDEPENDENT_AMBULATORY_CARE_PROVIDER_SITE_OTHER): Payer: Medicare HMO | Admitting: Pharmacist

## 2023-04-29 DIAGNOSIS — K219 Gastro-esophageal reflux disease without esophagitis: Secondary | ICD-10-CM

## 2023-04-29 DIAGNOSIS — I10 Essential (primary) hypertension: Secondary | ICD-10-CM

## 2023-04-29 DIAGNOSIS — E1165 Type 2 diabetes mellitus with hyperglycemia: Secondary | ICD-10-CM

## 2023-04-29 DIAGNOSIS — E1169 Type 2 diabetes mellitus with other specified complication: Secondary | ICD-10-CM

## 2023-04-29 DIAGNOSIS — E785 Hyperlipidemia, unspecified: Secondary | ICD-10-CM

## 2023-04-29 NOTE — Progress Notes (Signed)
Pharmacy Note  04/29/2023 Name: MARAI TEEHAN MRN: [id number] DOB: 1946-06-10  Subjective: [name] is a 77 y.o. year old female who is a primary care patient of [name], [name], DO. Clinical Pharmacist Practitioner referral was placed to assist with hypertension, hyperlipidemia diabetes and medication management.    Engaged with patient by telephone for follow up visit today.  Hypertension:  patient reports over the last few weeks her blood pressure at home has been 120-130 / 70  Current medications include: amlodipine 10mg  daily, losartan 100mg  daily and metoprolol ER 50mg  daily.  Patient has failed these meds in the past: lisinopril 40mg  - ineffective   BP Readings from Last 3 Encounters:  04/15/23 120/70  01/25/23 (!) 150/60  12/11/22 130/70    Type 2 DM, insulin dependent:   Last OV with Dr Lonzo Cloud was 12/11/2022   She is using Continuous Glucose Monitor - Dex Com 6 regularly to monitor blood glucose and endorses adherence to diabetes medications.  Patient denies blood glucose < 80. She states FBG usually around 150's. Blood glucose rises when she eats, sometimes > 200.   Current medications for DM: Tresiba 48 units daily, Jardiance 25mg  daily, Novolog 16 units with brunch and 18 units with supper with correction of 130/25 if needed.   Previous meds: glimepiride - stopped because patient wasn't taking and started insulin, Januvia - stopped due to history of pancreatitis; Metformin - GI intolerance, possibly cause of pancreatitis.   GLP1 agonists are not recommended due to history of pancreatits  Diet - Trying to avoid breads and sweets. She has tried cutting out Diet Pepsi as recommended by her GI specialist 01/25/2023   Exercise - walking a little  Hyperlipidemia:  Patient is taking atorvastatin 80mg  daily.  Past medications: rosuvastatin - cause palpitations.   Last LDL was 80 and Tg 190  Medication Management:  Patient uses [facility] and adherence packaging. Receives delivery every month now (she previously was able to get 3 months supply at a time but [facility] changed their policy) Last received delivery of packaging 03/26/2023. Will need next packaging soon.  Objective: Review of patient status, including review of consultants reports, laboratory and other test data, was performed as part of comprehensive evaluation and provision of chronic care management services.   Lab Results  Component Value Date   CREATININE 1.20 04/15/2023   CREATININE 1.04 11/19/2022   CREATININE 1.19 (H) 05/18/2022    Lab Results  Component Value Date   HGBA1C 8.1 (A) 12/11/2022       Component Value Date/Time   CHOL 149 04/15/2023 1407   TRIG 190.0 (H) 04/15/2023 1407   HDL 31.10 (L) 04/15/2023 1407   CHOLHDL 5 04/15/2023 1407   VLDL 38.0 04/15/2023 1407   LDLCALC 80 04/15/2023 1407   LDLCALC 76 05/18/2022 1433   LDLDIRECT 53.0 04/24/2021 1542     Clinical ASCVD: Yes  The 10-year ASCVD risk score (Arnett DK, et al., 2019) is: 20.4%   Values used to calculate the score:     Age: 89 years     Sex: Female     Is Non-Hispanic African American: Yes     Diabetic: Yes     Tobacco smoker: No     Systolic Blood Pressure: 120 mmHg     Is BP treated: Yes     HDL Cholesterol: 31.1 mg/dL     Total Cholesterol: 149 mg/dL    BP Readings from Last 3 Encounters:  04/15/23 120/70  01/25/23 (!) 150/60  12/11/22 130/70     Allergies  Allergen Reactions   Metformin And Related Other (See Comments)    Sick on stomach, sweaty, throw up, pain   Levemir [Insulin Detemir] Hives and Swelling   Clindamycin Nausea And Vomiting   Crestor [Rosuvastatin Calcium] Palpitations    Medications Reviewed Today     Reviewed by Donato Schultz, DO (Physician) on 04/15/23 at 1415  Med List Status: <None>   Medication Order Taking? Sig Documenting Provider Last Dose Status Informant  Alcohol Swabs (B-D SINGLE USE SWABS REGULAR)  PADS [id number] Yes USE AS DIRECTED [name], [name], DO Taking Active   alendronate (FOSAMAX) 70 MG tablet [id number] Yes TAKE ONE TABLET BY MOUTH ONCE WEEKLY ON WEDNESDAY Donato Schultz, DO Taking Active   ALPRAZolam Prudy Feeler) 0.25 MG tablet [id number] Yes Take 1 tablet by mouth three times a day if needed Donato Schultz, DO Taking Active   amLODipine (NORVASC) 10 MG tablet [id number] Yes TAKE ONE TABLET BY MOUTH ONCE DAILY Donato Schultz, DO Taking Active   aspirin EC 81 MG tablet [id number] Yes Take 81 mg by mouth daily. [provider] Taking Active Self  atorvastatin (LIPITOR) 80 MG tablet [id number] Yes Take 1 tablet (80 mg total) by mouth every evening. Seabron Spates R, DO Taking Active   buPROPion (WELLBUTRIN XL) 150 MG 24 hr tablet [id number] Yes Take 1 tablet (150 mg total) by mouth every morning. Seabron Spates R, DO Taking Active   Carboxymethylcellulose Sodium (THERATEARS OP) [id number] Yes Place 1 drop into both eyes daily as needed (dry eyes). [provider] Taking Active Self  Continuous Blood Gluc Sensor (DEXCOM G6 SENSOR) MISC [id number] Yes 1 Device by Does not apply route as directed. Shamleffer, Konrad Dolores, MD Taking Active   empagliflozin (JARDIANCE) 25 MG TABS tablet [id number] Yes Take 1 tablet (25 mg total) by mouth daily before breakfast. Shamleffer, Konrad Dolores, MD Taking Active   fenofibrate 160 MG tablet [id number] Yes TAKE ONE TABLET BY MOUTH EVERY EVENING Donato Schultz, DO Taking Active   ferrous sulfate 325 (65 FE) MG tablet [id number] Yes Take 325 mg by mouth daily with breakfast.  [provider] Taking Active Self  fexofenadine (ALLEGRA) 180 MG tablet [id number] Yes Take 180 mg by mouth daily as needed for allergies or rhinitis. [provider] Taking Active   FLUoxetine (PROZAC) 20 MG capsule [id number] Yes TAKE THREE CAPSULES BY MOUTH ONCE DAILY [name], [name], DO Taking Active    fluticasone (FLONASE) 50 MCG/ACT nasal spray [id number] Yes INSTILL 2 SPRAYS IN [facility] NOSTRIL DAILY Seabron Spates R, DO Taking Active   glucose blood (TRUE METRIX BLOOD GLUCOSE TEST) test strip [id number] Yes USE AS DIRECTED 4 TIMES A DAY [name], Yvonne R, DO Taking Active   insulin aspart (NOVOLOG FLEXPEN) 100 UNIT/ML FlexPen [id number] Yes Max daily 70 units Shamleffer, Konrad Dolores, MD Taking Active   Insulin Pen Needle (COMFORT EZ PEN NEEDLES) 32G X 4 MM MISC [id number] Yes USE AS DIRECTED TWICE DAILY Shamleffer, Konrad Dolores, MD Taking Active   metoprolol succinate (TOPROL-XL) 50 MG 24 hr tablet [id number] Yes TAKE ONE TABLET BY MOUTH ONCE DAILY WITH FOOD [name], [name], DO Taking Active   Multiple Vitamin (MULTIVITAMIN WITH MINERALS) TABS [id number] Yes Take 1 tablet by mouth daily. [provider] Taking Active Self  Omega-3 Fatty Acids (FISH OIL) 1000 MG CAPS [id number]  Yes Take 1 capsule by mouth in the morning and at bedtime. [provider] Taking Active            Med Note Morrie Sheldon, Reyne Dumas Oct 14, 2020  9:11 AM)    pantoprazole (PROTONIX) 40 MG tablet [id number] Yes Take 1 tablet (40 mg total) by mouth daily. [name], Myrene Buddy R, DO Taking Active   TRESIBA FLEXTOUCH 100 UNIT/ML FlexTouch Pen [id number] Yes Inject 48 Units into the skin daily. Shamleffer, Konrad Dolores, MD Taking Active   valsartan (DIOVAN) 320 MG tablet [id number] Yes TAKE ONE TABLET BY MOUTH EVERY MORNING Donato Schultz, DO Taking Active   vitamin E 400 UNIT capsule [id number] Yes Take 400 Units by mouth daily. [provider] Taking Active Self            Patient Active Problem List   Diagnosis Date Noted   Bilateral hearing loss 04/15/2023   Anxiety 11/19/2022   Pain of right heel 11/19/2022   Radiculopathy, lumbar region 05/31/2022   Primary hypertension 09/18/2021   Pain of left calf 09/18/2021   Depression with anxiety 06/21/2021   Diabetes mellitus  (HCC) 01/17/2021   Type 2 diabetes mellitus with hyperglycemia, without long-term current use of insulin (HCC) 01/17/2021   Acute pain of right shoulder 08/18/2020   Primary osteoarthritis of right knee 08/18/2020   Impacted cerumen of right ear 05/24/2020   Bilateral impacted cerumen 05/17/2020   Hyperlipidemia associated with type 2 diabetes mellitus (HCC) 03/10/2020   Cough 03/10/2020   Seasonal allergies 03/10/2020   Elevated rheumatoid factor 07/21/2018   Gastroenteritis 07/21/2018   Trigger middle finger of left hand 12/19/2017   Ganglion cyst of dorsum of right wrist 12/19/2017   Morton's neuroma of right foot 12/19/2017   Preventative health care 12/13/2017   Pain in both hands 12/13/2017   Hyperlipidemia LDL goal <100 12/13/2017   Abscess of upper gum 12/13/2017   Spinal stenosis of cervical region 09/25/2016   Cervical radiculopathy 09/06/2016   Dyspnea 02/18/2016   Chest pain 02/18/2016   HLD (hyperlipidemia) 02/18/2016   CAD in native artery 02/18/2016   RLS (restless legs syndrome) 02/18/2016   Pancreatitis 02/18/2016   SOB (shortness of breath) 02/18/2016   Uncontrolled type 2 diabetes mellitus with complication    Tobacco abuse    Osteoarthritis of right knee 07/05/2015   Smoking 07/02/2013   Acute upper respiratory infections of unspecified site 07/02/2013   Flu vaccine need 07/02/2013   Multinodular goiter 04/16/2013   Bunion 07/07/2012   BACK PAIN, THORACIC REGION, RIGHT 05/24/2009   IRON DEFICIENCY 03/25/2009   GERD 03/25/2009   WEIGHT LOSS 03/25/2009   SINUSITIS- ACUTE-NOS 11/25/2008   Hyperlipidemia LDL goal <70 10/07/2008   ACUTE PANCREATITIS 09/07/2008   OVARIAN CYST 09/07/2008   KNEE PAIN, RIGHT 05/24/2008   CARPAL TUNNEL SYNDROME, RIGHT 05/12/2008   Acute pain of both shoulders 04/02/2008   ROTATOR CUFF REPAIR, RIGHT, HX OF 03/29/2008   GOITER, MULTINODULAR 12/26/2007   Depression 07/07/2007   HIP PAIN, RIGHT 03/11/2007   RESTLESS LEG  SYNDROME, HX OF 03/11/2007   Diabetes mellitus type II, uncontrolled (HCC) 03/07/2007   Essential hypertension 03/07/2007     Medication Assistance:  None required.  Patient affirms current coverage meets needs. Approved for LIS for 2024.     Assessment / Plan:    Type 2 DM - A1c is improving but not at goal yet. Continue current therapy for diabetes and continue to follow up  with Dr Lonzo Cloud. Discussed insulin regimen and using correction factor with Novolog mealtime doses.  Continue to use DexCom 6 to continuously check blood glucose.  Hypertension: blood pressure at goal Continue valsartan 320mg  daily, amlodipine 10mg  daily and metoprolol ER daily Encouraged her to increase daily exercise - walking is great, work up as able to goal of 150 minutes per week.   Hyperlipidemia: LDL not at goal of < 70 Continue atorvastatin to 80mg  daily.  Daily exercise will help with cholesterol as well.   Medication management:  Coordinated with pharmacy regarding upcoming adherence packaging needs.   Patient asked about referral for audiologist. Looks like Riley Churches tried to contact her but was unable to reach patient and no VM set up. I was going to try to message Janine Limbo to see if we could get appt set up while I had patient on the phone but it appears she is Out of the Office. Patient was instructed to be on look out for Janine Limbo to call her again.   Follow Up:  Telephone follow up appointment with care management team member scheduled for:  3 months.    [name], PharmD Clinical Pharmacist [facility]  - [facility] [phone]

## 2023-05-16 ENCOUNTER — Ambulatory Visit: Payer: Medicare HMO | Attending: Family Medicine | Admitting: Audiology

## 2023-05-16 DIAGNOSIS — H903 Sensorineural hearing loss, bilateral: Secondary | ICD-10-CM | POA: Diagnosis not present

## 2023-05-16 NOTE — Procedures (Signed)
  Outpatient Audiology and [facility] [address] [place], [place]  [address] [phone]  AUDIOLOGICAL  EVALUATION  NAME: [name]     DOB:   06-Sep-1946      MRN: [id number]                                                                                     DATE: 05/16/2023     REFERENT: Donato Schultz, DO STATUS: Outpatient DIAGNOSIS: sensorineural hearing loss, bilateral    History: Deneka was seen for an audiological evaluation due to decreased hearing occurring for 6 to 7 years.  Charee reports increased difficulty hearing in the presence of background noise.  She denies otalgia, aural fullness, tinnitus, and vertigo.  Evaluation:  Otoscopy showed a clear view of the tympanic membranes, bilaterally Tympanometry results were consistent with normal middle ear pressure and normal middle ear function in both the ears. Audiometric testing was completed using Conventional Audiometry techniques with insert earphones and TDH headphones. Test results are consistent with a mild to moderate sensorineural hearing loss bilaterally. Speech Recognition Thresholds were obtained at 35 dB HL in the right ear and at 30  dB HL in the left ear. Word Recognition Testing was completed at 75 dB HL and Vicki scored 100%, bilaterally.       Results:  The test results were reviewed with Talbert Forest.  Today's test results are consistent with a mild to moderate sensorineural hearing loss in both ears.  Kaileigh will have communication difficulties in adverse listening environments.  She will benefit from the use of good communication strategies and the use of amplification if motivated.  Takeia was provided with a list of hearing aid providers in the [place] area.  Recommendations: 1.   Communication needs assessment with an audiologist to further discuss hearing aids    30 minutes spent testing and counseling on results.   If you have any questions please feel free to  contact me at [phone].  Marton Redwood Audiologist, Au.D., CCC-A 05/16/2023  1:59 PM  Cc: Donato Schultz, DO

## 2023-05-21 ENCOUNTER — Encounter: Payer: Self-pay | Admitting: Internal Medicine

## 2023-05-21 ENCOUNTER — Ambulatory Visit: Payer: Medicare HMO | Admitting: Internal Medicine

## 2023-05-21 VITALS — BP 126/80 | HR 60 | Ht 64.0 in | Wt 173.0 lb

## 2023-05-21 DIAGNOSIS — E1165 Type 2 diabetes mellitus with hyperglycemia: Secondary | ICD-10-CM | POA: Diagnosis not present

## 2023-05-21 DIAGNOSIS — E1159 Type 2 diabetes mellitus with other circulatory complications: Secondary | ICD-10-CM

## 2023-05-21 DIAGNOSIS — Z794 Long term (current) use of insulin: Secondary | ICD-10-CM

## 2023-05-21 LAB — POCT GLYCOSYLATED HEMOGLOBIN (HGB A1C): Hemoglobin A1C: 8.9 % — AB (ref 4.0–5.6)

## 2023-05-21 MED ORDER — EMPAGLIFLOZIN 25 MG PO TABS
25.0000 mg | ORAL_TABLET | Freq: Every day | ORAL | 3 refills | Status: DC
Start: 2023-05-21 — End: 2024-05-26

## 2023-05-21 MED ORDER — COMFORT EZ PEN NEEDLES 32G X 4 MM MISC: 1.0000 | Freq: Four times a day (QID) | 3 refills | Status: AC

## 2023-05-21 MED ORDER — NOVOLOG FLEXPEN 100 UNIT/ML ~~LOC~~ SOPN: PEN_INJECTOR | SUBCUTANEOUS | 6 refills | Status: DC

## 2023-05-21 MED ORDER — TRESIBA FLEXTOUCH 100 UNIT/ML ~~LOC~~ SOPN
50.0000 [IU] | PEN_INJECTOR | Freq: Every day | SUBCUTANEOUS | 3 refills | Status: DC
Start: 2023-05-21 — End: 2023-12-30

## 2023-05-21 MED ORDER — PIOGLITAZONE HCL 15 MG PO TABS: 15.0000 mg | ORAL_TABLET | Freq: Every day | ORAL | 3 refills | Status: DC

## 2023-05-21 NOTE — Patient Instructions (Addendum)
-   Start Actos ( Pioglitazone ) 15 mg daily  - Continue Jardiance 25 mg, 1 tablet in the morning  - IncreaseTresiba 50 units ONCE daily  - Change  Novolog 16 units with Brunch , 18 units with Supper  -Novolog correctional insulin: ADD extra units on insulin to your meal-time Novolog dose if your blood sugars are higher than 155. Use the scale below to help guide you:   Blood sugar before meal Number of units to inject  Less than 155 0 unit  156 -  180 1 units  181 -  205 2 units  206 -  230 3 units  231 -  255 4 units  256 -  280 5 units  281 -  305 6 units  306 -  330 7 units  331 -  355 8 units         HOW TO TREAT LOW BLOOD SUGARS (Blood sugar LESS THAN 70 MG/DL) Please follow the RULE OF 15 for the treatment of hypoglycemia treatment (when your (blood sugars are less than 70 mg/dL)   STEP 1: Take 15 grams of carbohydrates when your blood sugar is low, which includes:  3-4 GLUCOSE TABS  OR 3-4 OZ OF JUICE OR REGULAR SODA OR ONE TUBE OF GLUCOSE GEL    STEP 2: RECHECK blood sugar in 15 MINUTES STEP 3: If your blood sugar is still low at the 15 minute recheck --> then, go back to STEP 1 and treat AGAIN with another 15 grams of carbohydrates

## 2023-05-21 NOTE — Progress Notes (Signed)
Name: [name]  Age/ Sex: 77 y.o., female   MRN/ DOB: [id number], 1946-09-03     PCP: Donato Schultz, DO   Reason for Endocrinology Evaluation: Type 2 Diabetes Mellitus  Initial Endocrine Consultative Visit: 01/17/2021    PATIENT IDENTIFIER: [name] is a 77 y.o. female with a past medical history of HTN, T2Dm, CAD , Osteoporosis and Hx of pancreatitis. The patient has followed with Endocrinology clinic since 4 f/09/2021 or consultative assistance with management of her diabetes.  DIABETIC HISTORY:  [name] was diagnosed with DM in 1998, has intolerance to Metformin due to Gi side effects. Her hemoglobin A1c has ranged from 9.3% in 2015, peaking at 10.1% in 2020.  She has history of Pancreatitis that she believes is caused Metformin   On her initial visit to our clinic she had an A1c of 9.4%  , she was on Jardiance,Glimepiride , Januvia and insulin mix . We stopped Glimepiride as she was not taking it, we stopped Januvia due to hx of pancreatitis. We continued jardiance and adjusted insulin mix     Switched insulin Mix to Basal/prandial insulin 05/2021   SUBJECTIVE:   During the last visit (12/11/2022): A1c 8.1%    Today (05/21/2023): [name] is here for a follow up on diabetes management. She checks her blood sugars 2-3 times daily. The patient has not had hypoglycemic episodes since the last clinic visit.  She did not bring a meter today  She continues to follow-up with GI for indigestion as well as fecal incontinence  She was seen by podiatry 12/2022  She is waiting to have right shoulder sx but waiting on A1c to be less then 8%   Denies nausea and vomiting She does have alternating bowel movements depending on what she eats    HOME DIABETES REGIMEN:  Jardiance 25 mg, 1 tablet in the morning Tresiba 48 units ONCE daily  Novolog 16 units with brunch and 18 units with supper Correction factor: NovoLog (BG -130/25)      Statin:  yes ACE-I/ARB: yes   CONTINUOUS GLUCOSE MONITORING RECORD INTERPRETATION    Dates of Recording: 7/31-8/13/2024  Sensor description: dexcom  Results statistics:   CGM use % of time 86  Average and SD 193/37  Time in range 35  % Time Above 180 59  % Time above 250 6  % Time Below target 0    Glycemic patterns summary: BG's remain elevated overnight and fluctuate during the day  Hyperglycemic episodes postprandial  Hypoglycemic episodes occurred n/a   Overnight periods: Mostly high     DIABETIC COMPLICATIONS: Microvascular complications:  Neuropathy  Denies: CKD, retinopathy, neuropathy Last Eye Exam: Completed 01/2022  Macrovascular complications:  CAD ( S/P stent placement)  Denies: CVA, PVD   HISTORY:  Past Medical History:  Past Medical History:  Diagnosis Date   Blockage of coronary artery of heart (HCC)    Depression    Diabetes mellitus    Hyperlipidemia    Hypertension    Pancreatitis    Restless leg syndrome    Past Surgical History:  Past Surgical History:  Procedure Laterality Date   CATARACT EXTRACTION, BILATERAL     CORONARY ANGIOPLASTY WITH STENT PLACEMENT     FOOT SURGERY Right    cyst removal   ROTATOR CUFF REPAIR     right   ROTATOR CUFF REPAIR Left    TUBAL LIGATION     Social History:  reports that she quit smoking about  3 years ago. Her smoking use included cigarettes. She has never used smokeless tobacco. She reports that she does not drink alcohol and does not use drugs. Family History:  Family History  Problem Relation Age of Onset   Heart attack Father    Heart attack Brother    Diabetes Sister    Cirrhosis Sister    Hypertension Sister    Cancer Mother    Hypertension Mother    Vision loss Mother    Cancer Sister    Sleep apnea Sister    Heart Problems Sister    Diabetes Sister    Gout Sister    Diabetes Son    Heart attack Son    Diabetes Daughter    Goiter Daughter    Diabetes Daughter      HOME  MEDICATIONS: Allergies as of 05/21/2023       Reactions   Metformin And Related Other (See Comments)   Sick on stomach, sweaty, throw up, pain   Levemir [insulin Detemir] Hives, Swelling   Clindamycin Nausea And Vomiting   Crestor [rosuvastatin Calcium] Palpitations        Medication List        Accurate as of May 21, 2023  1:49 PM. If you have any questions, ask your nurse or doctor.          alendronate 70 MG tablet Commonly known as: FOSAMAX TAKE ONE TABLET BY MOUTH ONCE WEEKLY ON WEDNESDAY   ALPRAZolam 0.25 MG tablet Commonly known as: XANAX Take 1 tablet by mouth three times a day if needed   amLODipine 10 MG tablet Commonly known as: NORVASC TAKE ONE TABLET BY MOUTH ONCE DAILY   aspirin EC 81 MG tablet Take 81 mg by mouth daily.   atorvastatin 80 MG tablet Commonly known as: LIPITOR Take 1 tablet (80 mg total) by mouth every evening.   B-D SINGLE USE SWABS REGULAR Pads USE AS DIRECTED   buPROPion 150 MG 24 hr tablet Commonly known as: Wellbutrin XL Take 1 tablet (150 mg total) by mouth every morning.   Comfort EZ Pen Needles 32G X 4 MM Misc Generic drug: Insulin Pen Needle USE AS DIRECTED TWICE DAILY   Dexcom G6 Sensor Misc 1 Device by Does not apply route as directed.   empagliflozin 25 MG Tabs tablet Commonly known as: Jardiance Take 1 tablet (25 mg total) by mouth daily before breakfast.   fenofibrate 160 MG tablet TAKE ONE TABLET BY MOUTH EVERY EVENING   ferrous sulfate 325 (65 FE) MG tablet Take 325 mg by mouth daily with breakfast.   fexofenadine 180 MG tablet Commonly known as: ALLEGRA Take 180 mg by mouth daily as needed for allergies or rhinitis.   Fish Oil 1000 MG Caps Take 1 capsule by mouth in the morning and at bedtime.   FLUoxetine 20 MG capsule Commonly known as: PROZAC TAKE THREE CAPSULES BY MOUTH ONCE DAILY   fluticasone 50 MCG/ACT nasal spray Commonly known as: FLONASE INSTILL 2 SPRAYS IN EACH NOSTRIL DAILY    metoprolol succinate 50 MG 24 hr tablet Commonly known as: TOPROL-XL TAKE ONE TABLET BY MOUTH ONCE DAILY WITH FOOD   multivitamin with minerals Tabs tablet Take 1 tablet by mouth daily.   NovoLOG FlexPen 100 UNIT/ML FlexPen Generic drug: insulin aspart Max daily 70 units   pantoprazole 40 MG tablet Commonly known as: PROTONIX TAKE ONE TABLET BY MOUTH ONCE DAILY   THERATEARS OP Place 1 drop into both eyes daily as needed (dry eyes).  Evaristo Bury FlexTouch 100 UNIT/ML FlexTouch Pen Generic drug: insulin degludec Inject 48 Units into the skin daily.   True Metrix Blood Glucose Test test strip Generic drug: glucose blood USE AS DIRECTED 4 TIMES A DAY   valsartan 320 MG tablet Commonly known as: DIOVAN TAKE ONE TABLET BY MOUTH EVERY MORNING   vitamin E 180 MG (400 UNITS) capsule Take 400 Units by mouth daily.         OBJECTIVE:   Vital Signs: BP 126/80 (BP Location: Left Arm, Patient Position: Sitting, Cuff Size: Large)   Pulse 60   Ht 5\' 4"  (1.626 m)   Wt 173 lb (78.5 kg)   SpO2 94%   BMI 29.70 kg/m   Wt Readings from Last 3 Encounters:  05/21/23 173 lb (78.5 kg)  04/15/23 178 lb 3.2 oz (80.8 kg)  01/25/23 176 lb (79.8 kg)     Exam: General: Pt appears well and is in NAD  Lungs: Clear with good BS bilat   Heart: RRR   Extremities: No pretibial edema.   Neuro: MS is good with appropriate affect, pt is alert and Ox3    DM foot exam: 12/26/2022 per podiatry    DATA REVIEWED:  Lab Results  Component Value Date   HGBA1C 8.9 (A) 05/21/2023   HGBA1C 8.1 (A) 12/11/2022   HGBA1C 8.6 (A) 06/28/2022      Latest Reference Range & Units 05/18/22 14:33  Sodium 135 - 146 mmol/L 137  Potassium 3.5 - 5.3 mmol/L 4.0  Chloride 98 - 110 mmol/L 105  CO2 20 - 32 mmol/L 25  Glucose 65 - 99 mg/dL 161 (H)  BUN 7 - 25 mg/dL 26 (H)  Creatinine 0.96 - 1.00 mg/dL 0.45 (H)  Calcium 8.6 - 10.4 mg/dL 9.8  BUN/Creatinine Ratio 6 - 22 (calc) 22  AG Ratio 1.0 - 2.5 (calc)  2.0  AST 10 - 35 U/L 23  ALT 6 - 29 U/L 20  Total Protein 6.1 - 8.1 g/dL 6.3  Total Bilirubin 0.2 - 1.2 mg/dL 0.5    Latest Reference Range & Units 05/18/22 14:33  Total CHOL/HDL Ratio <5.0 (calc) 3.7  Cholesterol <200 mg/dL 409  HDL Cholesterol > OR = 50 mg/dL 37 (L)  LDL Cholesterol (Calc) mg/dL (calc) 76  Non-HDL Cholesterol (Calc) <130 mg/dL (calc) 99  Triglycerides <150 mg/dL 811 (H)      ASSESSMENT / PLAN / RECOMMENDATIONS:   1) Type 2 Diabetes Mellitus, poorly controlled , With macrovascular  complications - Most recent A1c of 8.9 %. Goal A1c < 7.0 %.     -Patient continues with worsening glycemic control - GLP-1 agonists and DPP-4 inhibitors are contra-indicated due to hx of pancreatitis   -I have again recommended trying pioglitazone, due to limited glycemic options, cautioned against lower extremity edema  MEDICATIONS: -Start pioglitazone 15 mg daily -Continue Jardiance 25 mg, 1 tablet in the morning  -Increase Tresiba 50 units ONCE daily  -Continue  Novolog 16 units with brunch and continue 18 units with supper -Continue correction factor: NovoLog (BG -130/25)     EDUCATION / INSTRUCTIONS: BG monitoring instructions: Patient is instructed to check her blood sugars 3 times a day, before meals. Call  Endocrinology clinic if: BG persistently < 70  I reviewed the Rule of 15 for the treatment of hypoglycemia in detail with the patient. Literature supplied.   2) Diabetic complications:  Eye: Does not have known diabetic retinopathy.  Neuro/ Feet: Does not have known diabetic peripheral neuropathy .  Renal: Patient does not have known baseline CKD. She   is  on an ACEI/ARB at present.      F/U in 4 months     Signed electronically by: Lyndle Herrlich, MD  [facility]  [facility] [address] [place] [place], [place] [address] Phone: [phone] FAX: [phone]   CC: [id number]  [facility] [address] [place] [address] Phone: [phone]  Fax: [phone]  Return to Endocrinology clinic as below: Future Appointments  Date Time Provider Department Center  05/31/2023  8:40 AM Laury Axon Irish Elders, DO [facility]  07/08/2023  1:30 PM [facility] PHARMACIST [facility]

## 2023-05-22 ENCOUNTER — Encounter: Payer: Self-pay | Admitting: Internal Medicine

## 2023-05-27 ENCOUNTER — Other Ambulatory Visit: Payer: Self-pay | Admitting: Family Medicine

## 2023-05-31 ENCOUNTER — Encounter: Payer: Medicare HMO | Admitting: Family Medicine

## 2023-06-14 ENCOUNTER — Encounter: Payer: Medicare HMO | Admitting: Family Medicine

## 2023-06-18 ENCOUNTER — Encounter: Payer: Medicare HMO | Admitting: Family Medicine

## 2023-07-03 ENCOUNTER — Other Ambulatory Visit: Payer: Self-pay

## 2023-07-03 ENCOUNTER — Emergency Department (HOSPITAL_BASED_OUTPATIENT_CLINIC_OR_DEPARTMENT_OTHER)
Admission: EM | Admit: 2023-07-03 | Discharge: 2023-07-03 | Disposition: A | Discharge status: Home / Self Care | Payer: Medicare HMO | Attending: Emergency Medicine | Admitting: Emergency Medicine

## 2023-07-03 ENCOUNTER — Emergency Department (HOSPITAL_BASED_OUTPATIENT_CLINIC_OR_DEPARTMENT_OTHER): Payer: Medicare HMO

## 2023-07-03 ENCOUNTER — Encounter (HOSPITAL_BASED_OUTPATIENT_CLINIC_OR_DEPARTMENT_OTHER): Payer: Self-pay | Admitting: Pediatrics

## 2023-07-03 DIAGNOSIS — Z7982 Long term (current) use of aspirin: Secondary | ICD-10-CM | POA: Diagnosis not present

## 2023-07-03 DIAGNOSIS — Z794 Long term (current) use of insulin: Secondary | ICD-10-CM | POA: Diagnosis not present

## 2023-07-03 DIAGNOSIS — E119 Type 2 diabetes mellitus without complications: Secondary | ICD-10-CM | POA: Diagnosis not present

## 2023-07-03 DIAGNOSIS — Z7984 Long term (current) use of oral hypoglycemic drugs: Secondary | ICD-10-CM | POA: Diagnosis not present

## 2023-07-03 DIAGNOSIS — M25572 Pain in left ankle and joints of left foot: Secondary | ICD-10-CM | POA: Diagnosis not present

## 2023-07-03 DIAGNOSIS — M79605 Pain in left leg: Secondary | ICD-10-CM | POA: Diagnosis not present

## 2023-07-03 DIAGNOSIS — M79662 Pain in left lower leg: Secondary | ICD-10-CM | POA: Diagnosis not present

## 2023-07-03 DIAGNOSIS — M7732 Calcaneal spur, left foot: Secondary | ICD-10-CM | POA: Diagnosis not present

## 2023-07-03 MED ORDER — GABAPENTIN 300 MG PO CAPS: 300.0000 mg | ORAL_CAPSULE | Freq: Three times a day (TID) | ORAL | 0 refills | Status: DC

## 2023-07-03 NOTE — Discharge Instructions (Signed)
Follow-up with foot doctor.  Wear walking boot for comfort.  Recommend Tylenol and ice.  Follow-up with primary care doctor.  Return if symptoms worsen.

## 2023-07-03 NOTE — ED Triage Notes (Signed)
C/O burning like sensation on both feet / ankle and sometimes on fingers, reports worst at nighttime.

## 2023-07-03 NOTE — ED Provider Notes (Signed)
2:20 PM Assumed care of patient from off-going team. For more details, please see note from same day.  In brief, this is a 77 y.o. female Ankle pain burning No back pain Not bilateral Possible neuropathy?  Plan/Dispo at time of sign-out & ED Course since sign-out: [ ]  DVT study, XR CAM boot F/u with podiatry  BP (!) 120/55   Pulse 60   Temp 98 F (36.7 C) (Oral)   Resp 18   Ht 5\' 5"  (1.651 m)   Wt 78.9 kg   SpO2 98%   BMI 28.96 kg/m    ED Course:   Clinical Course as of 07/05/23 1420  Wed Jul 03, 2023  1601 Neg L ankle XR, Neg LLE DVT study [HN]    Clinical Course User Index [HN] Loetta Rough, MD    Dispo: D/w patient who again reports burning sensation in her left heel. She has good cap refill and intact pulses, and sometimes it occurs in other areas of her body, this is likely a peripheral neuropathy. Offered patient gabapentin but she states that it does not work for her and she has taken it in the past but cannot remember for what or at what dose.  I will prescribe her gabapentin 300 mg 3 times daily to try.  Will also give cam boot for comfort.  She request something topical and I suggest over-the-counter neuropathy lotions/creams and following up with her primary care physician and/or podiatry.  Given discharge instructions and return precautions, all questions answered to patient satisfaction ------------------------------- Vivi Barrack, MD Emergency Medicine  This note was created using dictation software, which may contain spelling or grammatical errors.   Loetta Rough, MD 07/05/23 [phone]

## 2023-07-03 NOTE — ED Provider Notes (Signed)
[place] [facility] [facility] Provider Note   CSN: [id number] Arrival date & time: 07/03/23  1155     History  Chief Complaint  Patient presents with   Ankle Pain    [name] is a 77 y.o. female.  Patient is here with some discomfort in her left calf, left ankle area here the last few days.  She describes more of a burning sensation mostly in her heel but sometimes will have discomfort in her calf.  May be some overuse process.  She does have diabetes.  Denies any neuropathy.  Denies any back pain or pain down her legs.  Nothing makes it worse or better.  Denies any fevers or chills.  Denies any trauma.  The history is provided by the patient.       Home Medications Prior to Admission medications   Medication Sig Start Date End Date Taking? Authorizing Provider  Alcohol Swabs (B-D SINGLE USE SWABS REGULAR) PADS USE AS DIRECTED 03/15/20   [name], [name], DO  alendronate (FOSAMAX) 70 MG tablet TAKE ONE TABLET BY MOUTH ONCE WEEKLY ON [facility] 04/29/23   [name], [name], DO  ALPRAZolam Prudy Feeler) 0.25 MG tablet Take 1 tablet by mouth three times a day if needed 10/24/22   [name], [name], DO  amLODipine (NORVASC) 10 MG tablet TAKE ONE TABLET BY MOUTH ONCE DAILY 04/29/23   Donato Schultz, DO  aspirin EC 81 MG tablet Take 81 mg by mouth daily.    [provider]  atorvastatin (LIPITOR) 80 MG tablet Take 1 tablet (80 mg total) by mouth every evening. 01/23/23   [name], [name], DO  buPROPion (WELLBUTRIN XL) 150 MG 24 hr tablet TAKE ONE TABLET BY MOUTH EVERY MORNING 05/27/23   [name], [name], DO  Carboxymethylcellulose Sodium (THERATEARS OP) Place 1 drop into both eyes daily as needed (dry eyes).    [provider]  Continuous Blood Gluc Sensor (DEXCOM G6 SENSOR) MISC 1 Device by Does not apply route as directed. 09/13/21   Shamleffer, Konrad Dolores, MD  empagliflozin (JARDIANCE) 25 MG TABS tablet Take 1  tablet (25 mg total) by mouth daily before breakfast. 05/21/23   Shamleffer, Konrad Dolores, MD  fenofibrate 160 MG tablet TAKE ONE TABLET BY MOUTH EVERY EVENING 04/29/23   [name], Yvonne R, DO  ferrous sulfate 325 (65 FE) MG tablet Take 325 mg by mouth daily with breakfast.     [provider]  fexofenadine (ALLEGRA) 180 MG tablet Take 180 mg by mouth daily as needed for allergies or rhinitis.    [provider]  FLUoxetine (PROZAC) 20 MG capsule TAKE THREE CAPSULES BY MOUTH ONCE DAILY 04/29/23   [name], [name], DO  fluticasone [facility]) 50 MCG/ACT nasal spray INSTILL 2 SPRAYS IN [facility] NOSTRIL DAILY 01/14/23   [name], Myrene Buddy R, DO  glucose blood (TRUE METRIX BLOOD GLUCOSE TEST) test strip USE AS DIRECTED 4 TIMES A DAY 10/27/19   [name], [name], DO  insulin aspart (NOVOLOG FLEXPEN) 100 UNIT/ML FlexPen Max daily 70 units 05/21/23   Shamleffer, Konrad Dolores, MD  insulin degludec (TRESIBA FLEXTOUCH) 100 UNIT/ML FlexTouch Pen Inject 50 Units into the skin daily. 05/21/23   Shamleffer, Konrad Dolores, MD  Insulin Pen Needle (COMFORT EZ PEN NEEDLES) 32G X 4 MM MISC 1 Device by Other route in the morning, at noon, in the evening, and at bedtime. as directed 05/21/23   Shamleffer,  Konrad Dolores, MD  metoprolol succinate (TOPROL-XL) 50 MG 24 hr tablet TAKE ONE TABLET BY MOUTH ONCE DAILY WITH FOOD 04/29/23   Donato Schultz, DO  Multiple Vitamin (MULTIVITAMIN WITH MINERALS) TABS Take 1 tablet by mouth daily.    [provider]  Omega-3 Fatty Acids (FISH OIL) 1000 MG CAPS Take 1 capsule by mouth in the morning and at bedtime.    [provider]  pantoprazole (PROTONIX) 40 MG tablet TAKE ONE TABLET BY MOUTH ONCE DAILY 04/29/23   [name], [name], DO  pioglitazone (ACTOS) 15 MG tablet Take 1 tablet (15 mg total) by mouth daily. 05/21/23   Shamleffer, Konrad Dolores, MD  valsartan (DIOVAN) 320 MG tablet TAKE ONE TABLET BY MOUTH EVERY MORNING 04/29/23    [name], [name], DO  vitamin E 400 UNIT capsule Take 400 Units by mouth daily.    [provider]      Allergies    Metformin and related, Levemir [insulin detemir], Clindamycin, and Crestor [rosuvastatin calcium]    Review of Systems   Review of Systems  Physical Exam Updated Vital Signs BP (!) 120/55   Pulse 60   Temp 98 F (36.7 C) (Oral)   Resp 18   Ht 5\' 5"  (1.651 m)   Wt 78.9 kg   SpO2 98%   BMI 28.96 kg/m  Physical Exam Vitals and nursing note reviewed.  Constitutional:      General: She is not in acute distress.    Appearance: She is well-developed. She is not ill-appearing.  HENT:     Head: Normocephalic and atraumatic.  Eyes:     Extraocular Movements: Extraocular movements intact.     Conjunctiva/sclera: Conjunctivae normal.     Pupils: Pupils are equal, round, and reactive to light.  Cardiovascular:     Rate and Rhythm: Normal rate and regular rhythm.     Pulses: Normal pulses.     Heart sounds: Normal heart sounds. No murmur heard. Pulmonary:     Effort: Pulmonary effort is normal. No respiratory distress.     Breath sounds: Normal breath sounds.  Abdominal:     Palpations: Abdomen is soft.     Tenderness: There is no abdominal tenderness.  Musculoskeletal:        General: Tenderness present. No swelling.     Cervical back: Normal range of motion and neck supple.     Comments: Some mild tenderness to left calf, left posterior ankle area there is no major swelling to the legs or ankle  Skin:    General: Skin is warm and dry.     Capillary Refill: Capillary refill takes less than 2 seconds.  Neurological:     General: No focal deficit present.     Mental Status: She is alert.     Sensory: No sensory deficit.     Motor: No weakness.  Psychiatric:        Mood and Affect: Mood normal.     ED Results / Procedures / Treatments   Labs (all labs ordered are listed, but only abnormal results are displayed) Labs Reviewed - No data to  display  EKG None  Radiology No results found.  Procedures Procedures    Medications Ordered in ED Medications - No data to display  ED Course/ Medical Decision Making/ A&P  Medical Decision Making Amount and/or Complexity of Data Reviewed Radiology: ordered.   Virgel Manifold is here with some burning and discomfort to her left calf and ankle here the last few days.  Differential diagnosis could be some sort of diabetic neuropathy process or arthritis process, seems likely to be trauma or fracture, seems less likely to be DVT.  She is Strong pulses.  I have no concern for arterial process or clot.  Shared decision was made to get an x-ray of her left ankle and a DVT study.  She is neurovascular neuromuscular intact either way.  If studies are unremarkable we will have her placed in a walking boot or have her follow-up with podiatry.  Per my review and interpretation of ankle x-ray I do not see any obvious fracture or malalignment.  Patient awaiting DVT study at time of handoff to oncoming ED staff.  Please see their note for further results, evaluation, disposition of the patient.  If unremarkable recommend that she be placed in a walking boot and have her follow-up with foot doctor/primary care.  This chart was dictated using voice recognition software.  Despite best efforts to proofread,  errors can occur which can change the documentation meaning.         Final Clinical Impression(s) / ED Diagnoses Final diagnoses:  Acute left ankle pain    Rx / DC Orders ED Discharge Orders     None         Virgina Norfolk, DO 07/03/23 1430

## 2023-07-08 ENCOUNTER — Ambulatory Visit: Payer: Medicare HMO | Admitting: Pharmacist

## 2023-07-08 DIAGNOSIS — E1165 Type 2 diabetes mellitus with hyperglycemia: Secondary | ICD-10-CM

## 2023-07-08 DIAGNOSIS — I10 Essential (primary) hypertension: Secondary | ICD-10-CM

## 2023-07-08 DIAGNOSIS — E1169 Type 2 diabetes mellitus with other specified complication: Secondary | ICD-10-CM

## 2023-07-08 NOTE — Progress Notes (Signed)
Pharmacy Note  07/08/2023 Name: [name] MRN: [id number] DOB: 1946/01/07  Subjective: [name] is a 77 y.o. year old female who is a primary care patient of [name], [name], DO. Clinical Pharmacist Practitioner referral was placed to assist with hypertension, hyperlipidemia diabetes and medication management.    Engaged with patient by telephone for follow up visit today.  Hypertension:  patient reports over the last few weeks her blood pressure at home has been 120-130 / 70  Current medications include: amlodipine 10mg  daily, losartan 100mg  daily and metoprolol ER 50mg  daily.  Patient has failed these meds in the past: lisinopril 40mg  - ineffective   BP Readings from Last 3 Encounters:  07/03/23 (!) 120/55  05/21/23 126/80  04/15/23 120/70    Type 2 DM, insulin dependent:   Last OV with Dr Lonzo Cloud was 05/21/2023   She is using Continuous Glucose Monitor - Dex Com 6 regularly to monitor blood glucose and endorses adherence to diabetes medications.   Current medications for DM: Tresiba 50 units daily, Jardiance 25mg  daily, Novolog 16 units with brunch and 18 units with supper with correction of 130/25 if needed. (When blood glucose > 150). She mentions that most of the time she is taking Novolog after meals.  Previous meds: glimepiride - stopped because patient wasn't taking and started insulin, Januvia - stopped due to history of pancreatitis; Metformin - GI intolerance, possibly cause of pancreatitis.   GLP1 agonists are not recommended due to history of pancreatits  She reports a few times last week that she woke in the morning and felt sick. Checked blood glucose and it was 90's to 120' s Today while on the phone her blood glucose was 144 and patient stated "I am going to need to eat something soon" Asked about the trend arrow and was pointing to the side indicating that blood glucose was not changing quickly.   Patient denies blood glucose < 80.  She states FBG usually around 150's. Blood glucose rises when she eats, sometimes > 200.  I am not able to see patient's Continuous Glucose Monitor / Dex Com Clarity report.     Diet - Eats only 2 meals per day. Trying to avoid breads and sweets. She has tried cutting out Diet Pepsi as recommended by her GI specialist 01/25/2023   Exercise - walking a little  Hyperlipidemia:  Patient is taking atorvastatin 80mg  daily.  Past medications: rosuvastatin - cause palpitations.   Last LDL was 80 and Tg 190  Medication Management:  Patient previously used Engineering geologist and adherence packaging. She has changed to using her local pharmacy - [facility]. She endorses that she is taking all medications as prescribed.   Objective: Review of patient status, including review of consultants reports, laboratory and other test data, was performed as part of comprehensive evaluation and provision of chronic care management services.   Lab Results  Component Value Date   CREATININE 1.20 04/15/2023   CREATININE 1.04 11/19/2022   CREATININE 1.19 (H) 05/18/2022    Lab Results  Component Value Date   HGBA1C 8.9 (A) 05/21/2023       Component Value Date/Time   CHOL 149 04/15/2023 1407   TRIG 190.0 (H) 04/15/2023 1407   HDL 31.10 (L) 04/15/2023 1407   CHOLHDL 5 04/15/2023 1407   VLDL 38.0 04/15/2023 1407   LDLCALC 80 04/15/2023 1407   LDLCALC 76 05/18/2022 1433   LDLDIRECT 53.0 04/24/2021 1542     Clinical ASCVD:  Yes  The 10-year ASCVD risk score (Arnett DK, et al., 2019) is: 20.4%   Values used to calculate the score:     Age: 89 years     Sex: Female     Is Non-Hispanic African American: Yes     Diabetic: Yes     Tobacco smoker: No     Systolic Blood Pressure: 120 mmHg     Is BP treated: Yes     HDL Cholesterol: 31.1 mg/dL     Total Cholesterol: 149 mg/dL    BP Readings from Last 3 Encounters:  07/03/23 (!) 120/55  05/21/23 126/80  04/15/23 120/70     Allergies  Allergen  Reactions   Metformin And Related Other (See Comments)    Sick on stomach, sweaty, throw up, pain   Levemir [Insulin Detemir] Hives and Swelling   Clindamycin Nausea And Vomiting   Crestor [Rosuvastatin Calcium] Palpitations    Medications Reviewed Today   Medications were not reviewed in this encounter     Patient Active Problem List   Diagnosis Date Noted   Bilateral hearing loss 04/15/2023   Anxiety 11/19/2022   Pain of right heel 11/19/2022   Radiculopathy, lumbar region 05/31/2022   Primary hypertension 09/18/2021   Pain of left calf 09/18/2021   Depression with anxiety 06/21/2021   Diabetes mellitus (HCC) 01/17/2021   Type 2 diabetes mellitus with hyperglycemia, without long-term current use of insulin (HCC) 01/17/2021   Acute pain of right shoulder 08/18/2020   Primary osteoarthritis of right knee 08/18/2020   Impacted cerumen of right ear 05/24/2020   Bilateral impacted cerumen 05/17/2020   Hyperlipidemia associated with type 2 diabetes mellitus (HCC) 03/10/2020   Cough 03/10/2020   Seasonal allergies 03/10/2020   Elevated rheumatoid factor 07/21/2018   Gastroenteritis 07/21/2018   Trigger middle finger of left hand 12/19/2017   Ganglion cyst of dorsum of right wrist 12/19/2017   Morton's neuroma of right foot 12/19/2017   Preventative health care 12/13/2017   Pain in both hands 12/13/2017   Hyperlipidemia LDL goal <100 12/13/2017   Abscess of upper gum 12/13/2017   Spinal stenosis of cervical region 09/25/2016   Cervical radiculopathy 09/06/2016   Dyspnea 02/18/2016   Chest pain 02/18/2016   HLD (hyperlipidemia) 02/18/2016   CAD in native artery 02/18/2016   RLS (restless legs syndrome) 02/18/2016   Pancreatitis 02/18/2016   SOB (shortness of breath) 02/18/2016   Uncontrolled type 2 diabetes mellitus with complication    Tobacco abuse    Osteoarthritis of right knee 07/05/2015   Smoking 07/02/2013   Acute upper respiratory infections of unspecified site  07/02/2013   Flu vaccine need 07/02/2013   Multinodular goiter 04/16/2013   Bunion 07/07/2012   BACK PAIN, THORACIC REGION, RIGHT 05/24/2009   IRON DEFICIENCY 03/25/2009   GERD 03/25/2009   WEIGHT LOSS 03/25/2009   SINUSITIS- ACUTE-NOS 11/25/2008   Hyperlipidemia LDL goal <70 10/07/2008   ACUTE PANCREATITIS 09/07/2008   OVARIAN CYST 09/07/2008   KNEE PAIN, RIGHT 05/24/2008   CARPAL TUNNEL SYNDROME, RIGHT 05/12/2008   Acute pain of both shoulders 04/02/2008   ROTATOR CUFF REPAIR, RIGHT, HX OF 03/29/2008   GOITER, MULTINODULAR 12/26/2007   Depression 07/07/2007   HIP PAIN, RIGHT 03/11/2007   RESTLESS LEG SYNDROME, HX OF 03/11/2007   Diabetes mellitus type II, uncontrolled (HCC) 03/07/2007   Essential hypertension 03/07/2007     Medication Assistance:  None required.  Patient affirms current coverage meets needs. Approved for LIS for 2024.  Assessment / Plan:    Type 2 DM - A1c is not at goal yet. Would like to get < 8.0 so patient can have shoulder surgery.  Reviewed blood glucose goals. Fasting blood glucose goal (before meals) = 80 to 130 Blood glucose goal after a meal = less than 180  Reviewed how to treat low blood glucose readings (< 80). She can treat if she is having symptoms of low blood glucose but recommended that she check with fingerstick when she feels like blood glucose is low to confirm.  Provided education about how to interpret trend arrows and blood glucose readings with DexCom 6.  Reminded her to take Novolog PRIOR to meal for best results and to keep after meal blood glucose numbers down. Continue current therapy for diabetes (I suspect she needs insulin adjusted) and follow up with Dr Lonzo Cloud. Discussed insulin regimen and using correction factor with Novolog mealtime doses.  Continue to use DexCom 6 to continuously to check blood glucose.  Hypertension: blood pressure at goal Continue valsartan 320mg  daily, amlodipine 10mg  daily and metoprolol ER  daily  Hyperlipidemia: LDL not at goal of < 70 Continue atorvastatin to 80mg  daily.   Medication management:  Discussed adherene packaging options. Patient would like to get meds from [facility] but is she later feel that she would like to go back to adherence packages, she will let me know.   HM:  Reminded to make annual eye appointment - due 07/2023  Follow Up:  Telephone follow up appointment with care management team member scheduled for:  3 months.    [name], PharmD Clinical Pharmacist [facility]  - [facility] [phone]

## 2023-07-12 ENCOUNTER — Encounter: Payer: Self-pay | Admitting: Family Medicine

## 2023-07-12 ENCOUNTER — Ambulatory Visit (INDEPENDENT_AMBULATORY_CARE_PROVIDER_SITE_OTHER): Payer: Medicare HMO | Admitting: Family Medicine

## 2023-07-12 VITALS — BP 120/60 | HR 67 | Temp 97.8°F | Resp 18 | Ht 65.0 in | Wt 176.4 lb

## 2023-07-12 DIAGNOSIS — F418 Other specified anxiety disorders: Secondary | ICD-10-CM

## 2023-07-12 DIAGNOSIS — Z23 Encounter for immunization: Secondary | ICD-10-CM

## 2023-07-12 DIAGNOSIS — Z0000 Encounter for general adult medical examination without abnormal findings: Secondary | ICD-10-CM

## 2023-07-12 DIAGNOSIS — F419 Anxiety disorder, unspecified: Secondary | ICD-10-CM

## 2023-07-12 DIAGNOSIS — E2839 Other primary ovarian failure: Secondary | ICD-10-CM

## 2023-07-12 DIAGNOSIS — E1169 Type 2 diabetes mellitus with other specified complication: Secondary | ICD-10-CM

## 2023-07-12 DIAGNOSIS — I10 Essential (primary) hypertension: Secondary | ICD-10-CM

## 2023-07-12 DIAGNOSIS — E1165 Type 2 diabetes mellitus with hyperglycemia: Secondary | ICD-10-CM

## 2023-07-12 DIAGNOSIS — E785 Hyperlipidemia, unspecified: Secondary | ICD-10-CM

## 2023-07-12 MED ORDER — BUPROPION HCL ER (XL) 300 MG PO TB24
300.0000 mg | ORAL_TABLET | Freq: Every day | ORAL | 3 refills | Status: AC
Start: 2023-07-12 — End: ?

## 2023-07-12 MED ORDER — FLUOXETINE HCL 40 MG PO CAPS
40.0000 mg | ORAL_CAPSULE | Freq: Every day | ORAL | 3 refills | Status: DC
Start: 2023-07-12 — End: 2024-06-23

## 2023-07-12 MED ORDER — ALPRAZOLAM 0.25 MG PO TABS
ORAL_TABLET | ORAL | 1 refills | Status: DC
Start: 2023-07-12 — End: 2024-06-23

## 2023-07-12 NOTE — Assessment & Plan Note (Signed)
Well controlled, no changes to meds. Encouraged heart healthy diet such as the DASH diet and exercise as tolerated.  °

## 2023-07-12 NOTE — Progress Notes (Unsigned)
Established Patient Office Visit  Subjective   Patient ID: [name], female    DOB: 05-01-1946  Age: 77 y.o. MRN: [id number]  Chief Complaint  Patient presents with   Annual Exam    Pt states not fasting     HPI Discussed the use of AI scribe software for clinical note transcription with the patient, who gave verbal consent to proceed.  History of Present Illness   The patient, with a history of depression and anxiety, presents with worsening fatigue and low mood over the past 2-3 months. She describes feeling 'sluggish every day' and that it 'takes all I have to get up and move.' She admits to feeling depressed and reports that her current medication regimen of Prozac and Xanax is not adequately managing her symptoms. She finds that Xanax 'calms me down' but does not feel the same benefit from Prozac, which she believes may be increasing her anxiety.  The patient's husband, who has a history of polyps in the colon, is also experiencing health issues. He has been struggling with constipation and incomplete bowel movements despite taking Linzess and lactulose. He recently had a PET scan which showed a nodule in the right lung that may be an infection and a possible, but not definite, cancer. His red blood cell count is low but has improved slightly. The patient is concerned about the possibility of a growing polyp in his colon causing his bowel issues.  The patient also mentions a recent issue with her ankle, which she believes may have been a spider bite. It has since improved. She also mentions a recent visit to the [facility] in [place], where she was told her BUN and creatinine were 'acting up.' She has been checking her blood sugars, which have been 'up a little' this week.      Patient Active Problem List   Diagnosis Date Noted   Bilateral hearing loss 04/15/2023   Anxiety 11/19/2022   Pain of right heel 11/19/2022   Radiculopathy, lumbar region 05/31/2022    Primary hypertension 09/18/2021   Pain of left calf 09/18/2021   Depression with anxiety 06/21/2021   Diabetes mellitus (HCC) 01/17/2021   Type 2 diabetes mellitus with hyperglycemia, without long-term current use of insulin (HCC) 01/17/2021   Acute pain of right shoulder 08/18/2020   Primary osteoarthritis of right knee 08/18/2020   Impacted cerumen of right ear 05/24/2020   Bilateral impacted cerumen 05/17/2020   Hyperlipidemia associated with type 2 diabetes mellitus (HCC) 03/10/2020   Cough 03/10/2020   Seasonal allergies 03/10/2020   Elevated rheumatoid factor 07/21/2018   Gastroenteritis 07/21/2018   Trigger middle finger of left hand 12/19/2017   Ganglion cyst of dorsum of right wrist 12/19/2017   Morton's neuroma of right foot 12/19/2017   Preventative health care 12/13/2017   Pain in both hands 12/13/2017   Hyperlipidemia LDL goal <100 12/13/2017   Abscess of upper gum 12/13/2017   Spinal stenosis of cervical region 09/25/2016   Cervical radiculopathy 09/06/2016   Dyspnea 02/18/2016   Chest pain 02/18/2016   HLD (hyperlipidemia) 02/18/2016   CAD in native artery 02/18/2016   RLS (restless legs syndrome) 02/18/2016   Pancreatitis 02/18/2016   SOB (shortness of breath) 02/18/2016   Uncontrolled type 2 diabetes mellitus with complication    Tobacco abuse    Osteoarthritis of right knee 07/05/2015   Smoking 07/02/2013   Acute upper respiratory infection 07/02/2013   Flu vaccine need 07/02/2013   Multinodular goiter 04/16/2013  Bunion 07/07/2012   BACK PAIN, THORACIC REGION, RIGHT 05/24/2009   IRON DEFICIENCY 03/25/2009   GERD 03/25/2009   WEIGHT LOSS 03/25/2009   SINUSITIS- ACUTE-NOS 11/25/2008   Hyperlipidemia LDL goal <70 10/07/2008   ACUTE PANCREATITIS 09/07/2008   OVARIAN CYST 09/07/2008   KNEE PAIN, RIGHT 05/24/2008   CARPAL TUNNEL SYNDROME, RIGHT 05/12/2008   Acute pain of both shoulders 04/02/2008   ROTATOR CUFF REPAIR, RIGHT, HX OF 03/29/2008   GOITER,  MULTINODULAR 12/26/2007   Depression 07/07/2007   HIP PAIN, RIGHT 03/11/2007   RESTLESS LEG SYNDROME, HX OF 03/11/2007   Diabetes mellitus type II, uncontrolled (HCC) 03/07/2007   Essential hypertension 03/07/2007   Past Medical History:  Diagnosis Date   Blockage of coronary artery of heart (HCC)    Depression    Diabetes mellitus    Hyperlipidemia    Hypertension    Pancreatitis    Restless leg syndrome    Past Surgical History:  Procedure Laterality Date   CATARACT EXTRACTION, BILATERAL     CORONARY ANGIOPLASTY WITH STENT PLACEMENT     FOOT SURGERY Right    cyst removal   ROTATOR CUFF REPAIR     right   ROTATOR CUFF REPAIR Left    TUBAL LIGATION     Social History   Tobacco Use   Smoking status: Former    Current packs/day: 0.00    Types: Cigarettes    Quit date: 05/05/2020    Years since quitting: 3.1   Smokeless tobacco: Never   Tobacco comments:    quit in 2021  Vaping Use   Vaping status: Never Used  Substance Use Topics   Alcohol use: No   Drug use: No   Social History   Socioeconomic History   Marital status: Married    Spouse name: Not on file   Number of children: Not on file   Years of education: Not on file   Highest education level: Not on file  Occupational History   Not on file  Tobacco Use   Smoking status: Former    Current packs/day: 0.00    Types: Cigarettes    Quit date: 05/05/2020    Years since quitting: 3.1   Smokeless tobacco: Never   Tobacco comments:    quit in 2021  Vaping Use   Vaping status: Never Used  Substance and Sexual Activity   Alcohol use: No   Drug use: No   Sexual activity: Not Currently  Other Topics Concern   Not on file  Social History Narrative   Exercise: seldom   Caffeine use: seldom   Social Determinants of Health   Financial Resource Strain: Low Risk  (10/24/2022)   Overall Financial Resource Strain (CARDIA)    Difficulty of Paying Living Expenses: Not hard at all  Recent Concern: Financial  Resource Strain - Medium Risk (10/23/2022)   Overall Financial Resource Strain (CARDIA)    Difficulty of Paying Living Expenses: Somewhat hard  Food Insecurity: No Food Insecurity (10/24/2022)   Hunger Vital Sign    Worried About Running Out of Food in the Last Year: Never true    Ran Out of Food in the Last Year: Never true  Transportation Needs: No Transportation Needs (10/24/2022)   PRAPARE - Administrator, Civil Service (Medical): No    Lack of Transportation (Non-Medical): No  Physical Activity: Inactive (10/24/2022)   Exercise Vital Sign    Days of Exercise per Week: 0 days    Minutes of Exercise  per Session: 0 min  Stress: No Stress Concern Present (10/24/2022)   [employer] of Occupational Health - Occupational Stress Questionnaire    Feeling of Stress : Not at all  Social Connections: Moderately Integrated (10/24/2022)   Social Connection and Isolation Panel [NHANES]    Frequency of Communication with Friends and Family: More than three times a week    Frequency of Social Gatherings with Friends and Family: More than three times a week    Attends Religious Services: Never    Database administrator or Organizations: Yes    Attends Banker Meetings: 1 to 4 times per year    Marital Status: Married  Catering manager Violence: Not At Risk (10/24/2022)   Humiliation, Afraid, Rape, and Kick questionnaire    Fear of Current or Ex-Partner: No    Emotionally Abused: No    Physically Abused: No    Sexually Abused: No   Family Status  Relation Name Status   Father  Deceased   Sister  Alive   Brother  Deceased at age 33   Sister  Alive   Sister  Alive       neck/spine problems    Brother  Deceased       truck accident    Mother  Alive   Sister  Alive       blood clots   Sister  Deceased   Brother  Alive       blood clots   Son  Alive   Son  Alive   Son  Alive   Daughter  Alive   Daughter  Alive       back problems   No partnership data on  file   Family History  Problem Relation Age of Onset   Heart attack Father    Heart attack Brother    Diabetes Sister    Cirrhosis Sister    Hypertension Sister    Cancer Mother    Hypertension Mother    Vision loss Mother    Cancer Sister    Sleep apnea Sister    Heart Problems Sister    Diabetes Sister    Gout Sister    Diabetes Son    Heart attack Son    Diabetes Daughter    Goiter Daughter    Diabetes Daughter    Allergies  Allergen Reactions   Metformin And Related Other (See Comments)    Sick on stomach, sweaty, throw up, pain   Levemir [Insulin Detemir] Hives and Swelling   Clindamycin Nausea And Vomiting   Crestor [Rosuvastatin Calcium] Palpitations      Review of Systems  Constitutional:  Negative for chills, fever and malaise/fatigue.  HENT:  Negative for congestion and hearing loss.   Eyes:  Negative for blurred vision and discharge.  Respiratory:  Negative for cough, sputum production and shortness of breath.   Cardiovascular:  Negative for chest pain, palpitations and leg swelling.  Gastrointestinal:  Negative for abdominal pain, blood in stool, constipation, diarrhea, heartburn, nausea and vomiting.  Genitourinary:  Negative for dysuria, frequency, hematuria and urgency.  Musculoskeletal:  Negative for back pain, falls and myalgias.  Skin:  Negative for rash.  Neurological:  Negative for dizziness, sensory change, loss of consciousness, weakness and headaches.  Endo/Heme/Allergies:  Negative for environmental allergies. Does not bruise/bleed easily.  Psychiatric/Behavioral:  Negative for depression and suicidal ideas. The patient is not nervous/anxious and does not have insomnia.       Objective:  BP 120/60 (BP Location: Left Arm, Patient Position: Sitting, Cuff Size: Normal)   Pulse 67   Temp 97.8 F (36.6 C) (Oral)   Resp 18   Ht 5\' 5"  (1.651 m)   Wt 176 lb 6.4 oz (80 kg)   SpO2 98%   BMI 29.35 kg/m  BP Readings from Last 3 Encounters:   07/12/23 120/60  07/03/23 (!) 120/55  05/21/23 126/80   Wt Readings from Last 3 Encounters:  07/12/23 176 lb 6.4 oz (80 kg)  07/03/23 174 lb (78.9 kg)  05/21/23 173 lb (78.5 kg)   SpO2 Readings from Last 3 Encounters:  07/12/23 98%  07/03/23 98%  05/21/23 94%      Physical Exam Vitals and nursing note reviewed.  Constitutional:      General: She is not in acute distress.    Appearance: Normal appearance. She is well-developed.  HENT:     Head: Normocephalic and atraumatic.     Right Ear: Tympanic membrane, ear canal and external ear normal. There is no impacted cerumen.     Left Ear: Tympanic membrane, ear canal and external ear normal. There is no impacted cerumen.     Nose: Nose normal.     Mouth/Throat:     Mouth: Mucous membranes are moist.     Pharynx: Oropharynx is clear. No oropharyngeal exudate or posterior oropharyngeal erythema.  Eyes:     General: No scleral icterus.       Right eye: No discharge.        Left eye: No discharge.     Conjunctiva/sclera: Conjunctivae normal.     Pupils: Pupils are equal, round, and reactive to light.  Neck:     Thyroid: No thyromegaly or thyroid tenderness.     Vascular: No JVD.  Cardiovascular:     Rate and Rhythm: Normal rate and regular rhythm.     Heart sounds: Normal heart sounds. No murmur heard. Pulmonary:     Effort: Pulmonary effort is normal. No respiratory distress.     Breath sounds: Normal breath sounds.  Abdominal:     General: Bowel sounds are normal. There is no distension.     Palpations: Abdomen is soft. There is no mass.     Tenderness: There is no abdominal tenderness. There is no guarding or rebound.  Genitourinary:    Vagina: Normal.  Musculoskeletal:        General: Normal range of motion.     Cervical back: Normal range of motion and neck supple.     Right lower leg: No edema.     Left lower leg: No edema.  Lymphadenopathy:     Cervical: No cervical adenopathy.  Skin:    General: Skin is warm  and dry.     Findings: No erythema or rash.  Neurological:     Mental Status: She is alert and oriented to person, place, and time.     Cranial Nerves: No cranial nerve deficit.     Deep Tendon Reflexes: Reflexes are normal and symmetric.  Psychiatric:        Mood and Affect: Mood normal.        Behavior: Behavior normal.        Thought Content: Thought content normal.        Judgment: Judgment normal.      No results found for any visits on 07/12/23.  Last CBC Lab Results  Component Value Date   WBC 7.8 11/19/2022   HGB 13.2 11/19/2022   HCT  39.6 11/19/2022   MCV 89.7 11/19/2022   MCH 30.6 05/18/2022   RDW 13.9 11/19/2022   PLT 221.0 11/19/2022   Last metabolic panel Lab Results  Component Value Date   GLUCOSE 152 (H) 04/15/2023   NA 140 04/15/2023   K 3.8 04/15/2023   CL 104 04/15/2023   CO2 27 04/15/2023   BUN 22 04/15/2023   CREATININE 1.20 04/15/2023   GFR 43.74 (L) 04/15/2023   CALCIUM 10.3 04/15/2023   PHOS 5.0 (H) 03/03/2009   PROT 6.6 04/15/2023   ALBUMIN 4.3 04/15/2023   BILITOT 0.6 04/15/2023   ALKPHOS 38 (L) 04/15/2023   AST 22 04/15/2023   ALT 22 04/15/2023   ANIONGAP 13 09/23/2020   Last lipids Lab Results  Component Value Date   CHOL 149 04/15/2023   HDL 31.10 (L) 04/15/2023   LDLCALC 80 04/15/2023   LDLDIRECT 53.0 04/24/2021   TRIG 190.0 (H) 04/15/2023   CHOLHDL 5 04/15/2023   Last hemoglobin A1c Lab Results  Component Value Date   HGBA1C 8.9 (A) 05/21/2023   Last thyroid functions Lab Results  Component Value Date   TSH 0.58 08/11/2018   Last vitamin D No results found for: "25OHVITD2", "25OHVITD3", "VD25OH" Last vitamin B12 and Folate Lab Results  Component Value Date   VITAMINB12 913 (H) 07/16/2018   FOLATE 11.2 08/18/2009      The 10-year ASCVD risk score (Arnett DK, et al., 2019) is: 20.4%    Assessment & Plan:   Problem List Items Addressed This Visit       Unprioritized   Depression with anxiety   Relevant  Medications   ALPRAZolam (XANAX) 0.25 MG tablet   FLUoxetine (PROZAC) 40 MG capsule   buPROPion (WELLBUTRIN XL) 300 MG 24 hr tablet   Other Relevant Orders   TSH   Anxiety   Relevant Medications   ALPRAZolam (XANAX) 0.25 MG tablet   FLUoxetine (PROZAC) 40 MG capsule   buPROPion (WELLBUTRIN XL) 300 MG 24 hr tablet   Essential hypertension   Relevant Orders   CBC with Differential/Platelet   Comprehensive metabolic panel   Type 2 diabetes mellitus with hyperglycemia, without long-term current use of insulin (HCC)    hgba1c acceptable, minimize simple carbs. Increase exercise as tolerated. Continue current meds       Relevant Orders   Hemoglobin A1c   Microalbumin / creatinine urine ratio   Primary hypertension    Well controlled, no changes to meds. Encouraged heart healthy diet such as the DASH diet and exercise as tolerated.        Preventative health care - Primary    Ghm utd Check labs  See AVS Health Maintenance  Topic Date Due   Zoster Vaccines- Shingrix (1 of 2) 01/09/1996   DTaP/Tdap/Td (2 - Tdap) 03/10/2017   Diabetic kidney evaluation - Urine ACR  12/22/2022   INFLUENZA VACCINE  05/09/2023   COVID-19 Vaccine (3 - 2023-24 season) 06/09/2023   OPHTHALMOLOGY EXAM  07/18/2023   Medicare Annual Wellness (AWV)  10/25/2023   HEMOGLOBIN A1C  11/21/2023   FOOT EXAM  12/26/2023   Diabetic kidney evaluation - eGFR measurement  04/14/2024   Pneumonia Vaccine 95+ Years old  Completed   DEXA SCAN  Completed   Hepatitis C Screening  Completed   HPV VACCINES  Aged Out   Fecal DNA (Cologuard)  Discontinued         Hyperlipidemia associated with type 2 diabetes mellitus (HCC)    Encourage heart healthy diet such  as MIND or DASH diet, increase exercise, avoid trans fats, simple carbohydrates and processed foods, consider a krill or fish or flaxseed oil cap daily.        Relevant Orders   Comprehensive metabolic panel   Lipid panel   Other Visit Diagnoses     Need for  influenza vaccination       Relevant Orders   Flu Vaccine Trivalent High Dose (Fluad)   Estrogen deficiency       Relevant Orders   DG Bone Density     A          No follow-ups on file.    Donato Schultz, DO

## 2023-07-12 NOTE — Assessment & Plan Note (Signed)
Encourage heart healthy diet such as MIND or DASH diet, increase exercise, avoid trans fats, simple carbohydrates and processed foods, consider a krill or fish or flaxseed oil cap daily.  °

## 2023-07-12 NOTE — Assessment & Plan Note (Signed)
hgba1c acceptable, minimize simple carbs. Increase exercise as tolerated. Continue current meds 

## 2023-07-12 NOTE — Assessment & Plan Note (Signed)
Ghm utd Check labs  See AVS Health Maintenance  Topic Date Due   Zoster Vaccines- Shingrix (1 of 2) 01/09/1996   DTaP/Tdap/Td (2 - Tdap) 03/10/2017   Diabetic kidney evaluation - Urine ACR  12/22/2022   INFLUENZA VACCINE  05/09/2023   COVID-19 Vaccine (3 - 2023-24 season) 06/09/2023   OPHTHALMOLOGY EXAM  07/18/2023   Medicare Annual Wellness (AWV)  10/25/2023   HEMOGLOBIN A1C  11/21/2023   FOOT EXAM  12/26/2023   Diabetic kidney evaluation - eGFR measurement  04/14/2024   Pneumonia Vaccine 38+ Years old  Completed   DEXA SCAN  Completed   Hepatitis C Screening  Completed   HPV VACCINES  Aged Out   Fecal DNA (Cologuard)  Discontinued

## 2023-07-13 LAB — COMPREHENSIVE METABOLIC PANEL
AG Ratio: 2 (calc) (ref 1.0–2.5)
ALT: 18 U/L (ref 6–29)
AST: 18 U/L (ref 10–35)
Albumin: 4.2 g/dL (ref 3.6–5.1)
Alkaline phosphatase (APISO): 40 U/L (ref 37–153)
BUN/Creatinine Ratio: 25 (calc) — ABNORMAL HIGH (ref 6–22)
BUN: 29 mg/dL — ABNORMAL HIGH (ref 7–25)
CO2: 25 mmol/L (ref 20–32)
Calcium: 10.3 mg/dL (ref 8.6–10.4)
Chloride: 103 mmol/L (ref 98–110)
Creat: 1.17 mg/dL — ABNORMAL HIGH (ref 0.60–1.00)
Globulin: 2.1 g/dL (ref 1.9–3.7)
Glucose, Bld: 152 mg/dL — ABNORMAL HIGH (ref 65–99)
Potassium: 4 mmol/L (ref 3.5–5.3)
Sodium: 137 mmol/L (ref 135–146)
Total Bilirubin: 0.5 mg/dL (ref 0.2–1.2)
Total Protein: 6.3 g/dL (ref 6.1–8.1)

## 2023-07-13 LAB — LIPID PANEL
Cholesterol: 120 mg/dL (ref <200)
HDL: 36 mg/dL — ABNORMAL LOW (ref >=50)
LDL Cholesterol (Calc): 60 mg/dL
Non-HDL Cholesterol (Calc): 84 mg/dL (ref <130)
Total CHOL/HDL Ratio: 3.3 (calc) (ref <5.0)
Triglycerides: 163 mg/dL — ABNORMAL HIGH (ref <150)

## 2023-07-13 LAB — MICROALBUMIN / CREATININE URINE RATIO
Creatinine, Urine: 128 mg/dL (ref 20–275)
Microalb Creat Ratio: 77 mg/g{creat} — ABNORMAL HIGH (ref <30)
Microalb, Ur: 9.9 mg/dL

## 2023-07-13 LAB — HEMOGLOBIN A1C
Hgb A1c MFr Bld: 9.3 %{Hb} — ABNORMAL HIGH (ref <5.7)
Mean Plasma Glucose: 220 mg/dL
eAG (mmol/L): 12.2 mmol/L

## 2023-07-13 LAB — CBC WITH DIFFERENTIAL/PLATELET
Absolute Monocytes: 737 {cells}/uL (ref 200–950)
Basophils Absolute: 49 {cells}/uL (ref 0–200)
Basophils Relative: 0.6 %
Eosinophils Absolute: 57 {cells}/uL (ref 15–500)
Eosinophils Relative: 0.7 %
HCT: 36.4 % (ref 35.0–45.0)
Hemoglobin: 11.9 g/dL (ref 11.7–15.5)
Lymphs Abs: 3102 {cells}/uL (ref 850–3900)
MCH: 29.8 pg (ref 27.0–33.0)
MCHC: 32.7 g/dL (ref 32.0–36.0)
MCV: 91.2 fL (ref 80.0–100.0)
MPV: 11.9 fL (ref 7.5–12.5)
Monocytes Relative: 9.1 %
Neutro Abs: 4155 {cells}/uL (ref 1500–7800)
Neutrophils Relative %: 51.3 %
Platelets: 202 10*3/uL (ref 140–400)
RBC: 3.99 10*6/uL (ref 3.80–5.10)
RDW: 12.6 % (ref 11.0–15.0)
Total Lymphocyte: 38.3 %
WBC: 8.1 10*3/uL (ref 3.8–10.8)

## 2023-07-13 LAB — TSH: TSH: 0.59 m[IU]/L (ref 0.40–4.50)

## 2023-08-07 ENCOUNTER — Ambulatory Visit (INDEPENDENT_AMBULATORY_CARE_PROVIDER_SITE_OTHER): Payer: Medicare HMO | Admitting: Pharmacist

## 2023-08-07 DIAGNOSIS — E1165 Type 2 diabetes mellitus with hyperglycemia: Secondary | ICD-10-CM | POA: Diagnosis not present

## 2023-08-07 DIAGNOSIS — E785 Hyperlipidemia, unspecified: Secondary | ICD-10-CM

## 2023-08-07 DIAGNOSIS — I10 Essential (primary) hypertension: Secondary | ICD-10-CM

## 2023-08-07 NOTE — Progress Notes (Signed)
Pharmacy Note  08/07/2023 Name: [name] MRN: [id number] DOB: 1946-05-13  Subjective: [name] is a 77 y.o. year old female who is a primary care patient of [name], [name], DO. Clinical Pharmacist Practitioner referral was placed to assist with hypertension, hyperlipidemia diabetes and medication management.    Engaged with patient by telephone for follow up visit today.  Hypertension:  Checks blood pressure at home 2 to 3 times per week patient reports over the last few weeks her blood pressure at home has been 120's to 130's / 70's  Current medications include: amlodipine 10mg  daily, losartan 100mg  daily and metoprolol ER 50mg  daily.  Patient has failed these meds in the past: lisinopril 40mg  - ineffective   BP Readings from Last 3 Encounters:  07/12/23 120/60  07/03/23 (!) 120/55  05/21/23 126/80    Type 2 DM, insulin dependent:   Last OV with Dr Lonzo Cloud was 05/21/2023   She is using Continuous Glucose Monitor - Dex Com 6 regularly to monitor blood glucose and endorses adherence to diabetes medications. She is currently out of sensors but will receive delivery later this week from [facility].  Current medications for DM: Tresiba 50 units daily, Jardiance 25mg  daily, Novolog 16 units with brunch and 18 units with supper with correction of 130/25 if needed. (When blood glucose > 150). Previously she was taking Novolog after meals but today she endorses that she has been remembering to take prior to meals since out last visit.   Previous meds: glimepiride - stopped because patient wasn't taking and started insulin, Januvia - stopped due to history of pancreatitis; Metformin - GI intolerance, possible cause of pancreatitis.   GLP1 agonists are not recommended due to history of pancreatits  She does get a little sweaty when blood glucose is 80 to 100.   Patient denies blood glucose < 80. She states FBG usually around 150's.  Blood  glucose rises when she eats, sometimes > 200 but reports this is less often I am not able to see patient's Continuous Glucose Monitor / Dex Com Clarity report.    Diet - Eats only 2 meals per day. Trying to avoid breads and sweets but admits that she does not always follow diabetic type diet. She has tried cutting out Diet Pepsi as recommended by her GI specialist 01/25/2023   Exercise - walking a little Eye exam - has appointment for 08/2023  Hyperlipidemia:  Patient is taking atorvastatin 80mg  daily.  Past medications: rosuvastatin - cause palpitations.   Last LDL was 80 and Tg 190  Medication Management:  Patient previously used Engineering geologist and adherence packaging. She has changed to using her local pharmacy - [facility]. She endorses that she is taking all medications as prescribed.   Objective: Review of patient status, including review of consultants reports, laboratory and other test data, was performed as part of comprehensive evaluation and provision of chronic care management services.   Lab Results  Component Value Date   CREATININE 1.17 (H) 07/12/2023   CREATININE 1.20 04/15/2023   CREATININE 1.04 11/19/2022    Lab Results  Component Value Date   HGBA1C 9.3 (H) 07/12/2023       Component Value Date/Time   CHOL 120 07/12/2023 1459   TRIG 163 (H) 07/12/2023 1459   HDL 36 (L) 07/12/2023 1459   CHOLHDL 3.3 07/12/2023 1459   VLDL 38.0 04/15/2023 1407   LDLCALC 60 07/12/2023 1459   LDLDIRECT 53.0 04/24/2021 1542  Clinical ASCVD: Yes  The ASCVD Risk score (Arnett DK, et al., 2019) failed to calculate for the following reasons:   The valid total cholesterol range is 130 to 320 mg/dL    BP Readings from Last 3 Encounters:  07/12/23 120/60  07/03/23 (!) 120/55  05/21/23 126/80     Allergies  Allergen Reactions   Metformin And Related Other (See Comments)    Sick on stomach, sweaty, throw up, pain   Levemir [Insulin Detemir] Hives and Swelling    Clindamycin Nausea And Vomiting   Crestor [Rosuvastatin Calcium] Palpitations    Medications Reviewed Today   Medications were not reviewed in this encounter     Patient Active Problem List   Diagnosis Date Noted   Bilateral hearing loss 04/15/2023   Anxiety 11/19/2022   Pain of right heel 11/19/2022   Radiculopathy, lumbar region 05/31/2022   Primary hypertension 09/18/2021   Pain of left calf 09/18/2021   Depression with anxiety 06/21/2021   Diabetes mellitus (HCC) 01/17/2021   Type 2 diabetes mellitus with hyperglycemia, without long-term current use of insulin (HCC) 01/17/2021   Acute pain of right shoulder 08/18/2020   Primary osteoarthritis of right knee 08/18/2020   Impacted cerumen of right ear 05/24/2020   Bilateral impacted cerumen 05/17/2020   Hyperlipidemia associated with type 2 diabetes mellitus (HCC) 03/10/2020   Cough 03/10/2020   Seasonal allergies 03/10/2020   Elevated rheumatoid factor 07/21/2018   Gastroenteritis 07/21/2018   Trigger middle finger of left hand 12/19/2017   Ganglion cyst of dorsum of right wrist 12/19/2017   Morton's neuroma of right foot 12/19/2017   Preventative health care 12/13/2017   Pain in both hands 12/13/2017   Hyperlipidemia LDL goal <100 12/13/2017   Abscess of upper gum 12/13/2017   Spinal stenosis of cervical region 09/25/2016   Cervical radiculopathy 09/06/2016   Dyspnea 02/18/2016   Chest pain 02/18/2016   HLD (hyperlipidemia) 02/18/2016   CAD in native artery 02/18/2016   RLS (restless legs syndrome) 02/18/2016   Pancreatitis 02/18/2016   SOB (shortness of breath) 02/18/2016   Uncontrolled type 2 diabetes mellitus with complication    Tobacco abuse    Osteoarthritis of right knee 07/05/2015   Smoking 07/02/2013   Acute upper respiratory infection 07/02/2013   Flu vaccine need 07/02/2013   Multinodular goiter 04/16/2013   Bunion 07/07/2012   BACK PAIN, THORACIC REGION, RIGHT 05/24/2009   IRON DEFICIENCY  03/25/2009   GERD 03/25/2009   WEIGHT LOSS 03/25/2009   SINUSITIS- ACUTE-NOS 11/25/2008   Hyperlipidemia LDL goal <70 10/07/2008   ACUTE PANCREATITIS 09/07/2008   OVARIAN CYST 09/07/2008   KNEE PAIN, RIGHT 05/24/2008   CARPAL TUNNEL SYNDROME, RIGHT 05/12/2008   Acute pain of both shoulders 04/02/2008   ROTATOR CUFF REPAIR, RIGHT, HX OF 03/29/2008   GOITER, MULTINODULAR 12/26/2007   Depression 07/07/2007   HIP PAIN, RIGHT 03/11/2007   RESTLESS LEG SYNDROME, HX OF 03/11/2007   Diabetes mellitus type II, uncontrolled (HCC) 03/07/2007   Essential hypertension 03/07/2007     Medication Assistance:  None required.  Patient affirms current coverage meets needs. Approved for LIS for 2024.     Assessment / Plan:    Type 2 DM - A1c is not at goal. Reviewed blood glucose goals. Fasting blood glucose goal (before meals) = 80 to 130 Blood glucose goal after a meal = less than 180   Continue Tresiba and Novolog per endocrinology instructions. Reminded her to take Novolog PRIOR to meal for best results and  to keep after meal blood glucose numbers down.  Discussed insulin regimen and using correction factor with Novolog mealtime doses.  Continue to use DexCom 6 to continuously to check blood glucose.  Hypertension: blood pressure at goal Continue valsartan 320mg  daily, amlodipine 10mg  daily and metoprolol ER daily  Hyperlipidemia: LDL at goal of < 70 Continue atorvastatin to 80mg  daily and fenofibrate 160mg  daily Encourage increase in exercise with goal of 150 minutes per week. Could help with triglycerides and to increase HDLC  Medication management:  Reviewed med list Reviewed refill history and discussed adherence  HM:  Annual eye exam due - patient has appointment 08/2023  Follow Up:  Telephone follow up appointment with care management team member scheduled for:  3 months.    [name], PharmD Clinical Pharmacist [facility]  - [facility] [phone]

## 2023-08-23 DIAGNOSIS — E1165 Type 2 diabetes mellitus with hyperglycemia: Secondary | ICD-10-CM | POA: Diagnosis not present

## 2023-09-12 ENCOUNTER — Other Ambulatory Visit: Payer: Self-pay

## 2023-09-12 ENCOUNTER — Emergency Department (HOSPITAL_BASED_OUTPATIENT_CLINIC_OR_DEPARTMENT_OTHER): Payer: Medicare HMO

## 2023-09-12 ENCOUNTER — Encounter (HOSPITAL_BASED_OUTPATIENT_CLINIC_OR_DEPARTMENT_OTHER): Payer: Self-pay

## 2023-09-12 ENCOUNTER — Emergency Department (HOSPITAL_BASED_OUTPATIENT_CLINIC_OR_DEPARTMENT_OTHER)
Admission: EM | Admit: 2023-09-12 | Discharge: 2023-09-13 | Disposition: A | Discharge status: Home / Self Care | Payer: Medicare HMO | Attending: Emergency Medicine | Admitting: Emergency Medicine

## 2023-09-12 DIAGNOSIS — M47816 Spondylosis without myelopathy or radiculopathy, lumbar region: Secondary | ICD-10-CM | POA: Diagnosis not present

## 2023-09-12 DIAGNOSIS — Z7982 Long term (current) use of aspirin: Secondary | ICD-10-CM | POA: Insufficient documentation

## 2023-09-12 DIAGNOSIS — S3992XA Unspecified injury of lower back, initial encounter: Secondary | ICD-10-CM | POA: Diagnosis present

## 2023-09-12 DIAGNOSIS — E049 Nontoxic goiter, unspecified: Secondary | ICD-10-CM | POA: Diagnosis not present

## 2023-09-12 DIAGNOSIS — S39012A Strain of muscle, fascia and tendon of lower back, initial encounter: Secondary | ICD-10-CM | POA: Diagnosis not present

## 2023-09-12 DIAGNOSIS — M47812 Spondylosis without myelopathy or radiculopathy, cervical region: Secondary | ICD-10-CM | POA: Diagnosis not present

## 2023-09-12 DIAGNOSIS — W19XXXA Unspecified fall, initial encounter: Secondary | ICD-10-CM | POA: Diagnosis not present

## 2023-09-12 DIAGNOSIS — S161XXA Strain of muscle, fascia and tendon at neck level, initial encounter: Secondary | ICD-10-CM | POA: Diagnosis not present

## 2023-09-12 DIAGNOSIS — W000XXA Fall on same level due to ice and snow, initial encounter: Secondary | ICD-10-CM | POA: Insufficient documentation

## 2023-09-12 DIAGNOSIS — M4802 Spinal stenosis, cervical region: Secondary | ICD-10-CM | POA: Diagnosis not present

## 2023-09-12 DIAGNOSIS — S199XXA Unspecified injury of neck, initial encounter: Secondary | ICD-10-CM | POA: Diagnosis not present

## 2023-09-12 DIAGNOSIS — M546 Pain in thoracic spine: Secondary | ICD-10-CM | POA: Diagnosis not present

## 2023-09-12 DIAGNOSIS — S129XXA Fracture of neck, unspecified, initial encounter: Secondary | ICD-10-CM | POA: Diagnosis not present

## 2023-09-12 DIAGNOSIS — M542 Cervicalgia: Secondary | ICD-10-CM | POA: Diagnosis not present

## 2023-09-12 DIAGNOSIS — M5459 Other low back pain: Secondary | ICD-10-CM | POA: Diagnosis not present

## 2023-09-12 DIAGNOSIS — I700 Atherosclerosis of aorta: Secondary | ICD-10-CM | POA: Diagnosis not present

## 2023-09-12 DIAGNOSIS — M545 Low back pain, unspecified: Secondary | ICD-10-CM | POA: Diagnosis not present

## 2023-09-12 NOTE — ED Triage Notes (Addendum)
Pt slipped on ice yesterday and was seen at [facility] tonight they called her and told her to come here for further evaluation because they saw a possible fx in her neck.  No numbness or tingling in extremities C-collar placed in triage

## 2023-09-13 MED ORDER — METHOCARBAMOL 500 MG PO TABS: 500.0000 mg | ORAL_TABLET | Freq: Two times a day (BID) | ORAL | 0 refills | Status: DC

## 2023-09-13 NOTE — ED Provider Notes (Signed)
[place] [facility] DEPART[facility] AT [facility]  Provider Note  CSN: [id number] Arrival date & time: 09/12/23 2210  History Chief Complaint  Patient presents with   Back Pain   Neck Injury    [name] is a 77 y.o. female reports she slipped on some ice and fell yesterday morning, landing on her back. Did not have LOC. Was having some neck and lower back pain and so she went to local [facility] where [facility] were done. She was initially discharged, but they called her back a few hours later to tell her they were concerned about a possible fracture on her C-spine. Her pain is mostly lower back. She has taken some APAP without much improvement.   Home Medications Prior to Admission medications   Medication Sig Start Date End Date Taking? Authorizing Provider  methocarbamol (ROBAXIN) 500 MG tablet Take 1 tablet (500 mg total) by mouth 2 (two) times daily. 09/13/23  Yes Pollyann Savoy, MD  Alcohol Swabs (B-D SINGLE USE SWABS REGULAR) PADS USE AS DIRECTED 03/15/20   [name], [name], DO  alendronate (FOSAMAX) 70 MG tablet TAKE ONE TABLET BY MOUTH ONCE WEEKLY ON [facility] 04/29/23   [name], [name], DO  ALPRAZolam Prudy Feeler) 0.25 MG tablet Take 1 tablet by mouth three times a day if needed 07/12/23   [name], Myrene Buddy R, DO  amLODipine (NORVASC) 10 MG tablet TAKE ONE TABLET BY MOUTH ONCE DAILY 04/29/23   Donato Schultz, DO  aspirin EC 81 MG tablet Take 81 mg by mouth daily.    [provider]  atorvastatin (LIPITOR) 80 MG tablet Take 1 tablet (80 mg total) by mouth every evening. 01/23/23   Donato Schultz, DO  buPROPion (WELLBUTRIN XL) 300 MG 24 hr tablet Take 1 tablet (300 mg total) by mouth daily. 07/12/23   Donato Schultz, DO  Carboxymethylcellulose Sodium (THERATEARS OP) Place 1 drop into both eyes daily as needed (dry eyes).    [provider]  Continuous Blood Gluc Sensor (DEXCOM G6 SENSOR) MISC 1 Device by Does not apply route as  directed. 09/13/21   Shamleffer, Konrad Dolores, MD  empagliflozin (JARDIANCE) 25 MG TABS tablet Take 1 tablet (25 mg total) by mouth daily before breakfast. 05/21/23   Shamleffer, Konrad Dolores, MD  fenofibrate 160 MG tablet TAKE ONE TABLET BY MOUTH EVERY EVENING 04/29/23   [name], Yvonne R, DO  ferrous sulfate 325 (65 FE) MG tablet Take 325 mg by mouth daily with breakfast.     [provider]  fexofenadine (ALLEGRA) 180 MG tablet Take 180 mg by mouth daily as needed for allergies or rhinitis.    [provider]  FLUoxetine (PROZAC) 40 MG capsule Take 1 capsule (40 mg total) by mouth daily. 07/12/23   Donato Schultz, DO  fluticasone (FLONASE) 50 MCG/ACT nasal spray INSTILL 2 SPRAYS IN [facility] NOSTRIL DAILY 01/14/23   [name], [name], DO  gabapentin (NEURONTIN) 300 MG capsule Take 1 capsule (300 mg total) by mouth 3 (three) times daily. 07/03/23 08/02/23  Loetta Rough, MD  glucose blood (TRUE METRIX BLOOD GLUCOSE TEST) test strip USE AS DIRECTED 4 TIMES A DAY 10/27/19   [name], [name], DO  insulin aspart (NOVOLOG FLEXPEN) 100 UNIT/ML FlexPen Max daily 70 units 05/21/23   Shamleffer, Konrad Dolores, MD  insulin degludec (TRESIBA FLEXTOUCH) 100 UNIT/ML FlexTouch Pen Inject 50 Units into the skin daily. 05/21/23   Shamleffer,  Konrad Dolores, MD  Insulin Pen Needle (COMFORT EZ PEN NEEDLES) 32G X 4 MM MISC 1 Device by Other route in the morning, at noon, in the evening, and at bedtime. as directed 05/21/23   Shamleffer, Konrad Dolores, MD  metoprolol succinate (TOPROL-XL) 50 MG 24 hr tablet TAKE ONE TABLET BY MOUTH ONCE DAILY WITH FOOD 04/29/23   Donato Schultz, DO  Multiple Vitamin (MULTIVITAMIN WITH MINERALS) TABS Take 1 tablet by mouth daily.    [provider]  Omega-3 Fatty Acids (FISH OIL) 1000 MG CAPS Take 1 capsule by mouth in the morning and at bedtime.    [provider]  pioglitazone (ACTOS) 15 MG tablet Take 1 tablet (15 mg total)  by mouth daily. 05/21/23   Shamleffer, Konrad Dolores, MD  valsartan (DIOVAN) 320 MG tablet TAKE ONE TABLET BY MOUTH EVERY MORNING 04/29/23   [name], [name], DO  vitamin E 400 UNIT capsule Take 400 Units by mouth daily.    [provider]     Allergies    Metformin and related, Levemir [insulin detemir], Clindamycin, and Crestor [rosuvastatin calcium]   Review of Systems   Review of Systems Please see HPI for pertinent positives and negatives  Physical Exam BP (!) 171/62 (BP Location: Right Arm)   Pulse 66   Temp 97.6 F (36.4 C) (Oral)   Resp 18   Ht 5\' 5"  (1.651 m)   Wt 81.6 kg   SpO2 99%   BMI 29.95 kg/m   Physical Exam Vitals and nursing note reviewed.  Constitutional:      Appearance: Normal appearance.  HENT:     Head: Normocephalic and atraumatic.     Nose: Nose normal.     Mouth/Throat:     Mouth: Mucous membranes are moist.  Eyes:     Extraocular Movements: Extraocular movements intact.     Conjunctiva/sclera: Conjunctivae normal.  Cardiovascular:     Rate and Rhythm: Normal rate.  Pulmonary:     Effort: Pulmonary effort is normal.     Breath sounds: Normal breath sounds.  Abdominal:     General: Abdomen is flat.     Palpations: Abdomen is soft.     Tenderness: There is no abdominal tenderness.  Musculoskeletal:        General: Tenderness (lumbar paraspinal muscles, no midline tenderness) present. No swelling. Normal range of motion.     Cervical back: Neck supple. Tenderness (cervical paraspinal muscles, no midline tenderness) present.  Skin:    General: Skin is warm and dry.  Neurological:     General: No focal deficit present.     Mental Status: She is alert.     Cranial Nerves: No cranial nerve deficit.     Sensory: No sensory deficit.     Motor: No weakness.     Gait: Gait normal.  Psychiatric:        Mood and Affect: Mood normal.     ED Results / Procedures / Treatments   EKG None  Procedures Procedures  Medications  Ordered in the ED Medications - No data to display  Initial Impression and Plan  Patient here with neck and back pain after slipping and falling about 36 hours ago. She apparently had concerning xrays done at [facility], but they are not available for me to review. Send for CT and L-spine xrays here; I personally viewed the images from radiology studies and agree with radiologist interpretation: CT and L spine xrays are negative for acute fracture.  She was cleared from C-collar by NEXUS criteria and is ready for discharge home. She was advised to take OTC APAP and motrin as needed, will add muscle relaxer as well, cautioned to avoid taking that with any other sedating medications. Recommend rest, heat and PCP follow up, RTED for any other concerns.    ED Course       MDM Rules/Calculators/A&P Medical Decision Making Problems Addressed: Fall, initial encounter: acute illness or injury Strain of lumbar region, initial encounter: acute illness or injury Strain of neck muscle, initial encounter: acute illness or injury  Amount and/or Complexity of Data Reviewed Radiology: ordered and independent interpretation performed. Decision-making details documented in ED Course.  Risk OTC drugs. Prescription drug management.     Final Clinical Impression(s) / ED Diagnoses Final diagnoses:  Strain of lumbar region, initial encounter  Strain of neck muscle, initial encounter  Fall, initial encounter    Rx / DC Orders ED Discharge Orders          Ordered    methocarbamol (ROBAXIN) 500 MG tablet  2 times daily        09/13/23 0009             Pollyann Savoy, MD 09/13/23 0010

## 2023-10-08 ENCOUNTER — Telehealth: Payer: Self-pay | Admitting: Family Medicine

## 2023-10-08 NOTE — Telephone Encounter (Signed)
 Copied from CRM [id number]. Topic: Medicare AWV >> Oct 08, 2023  9:43 AM Nathanel DEL wrote: Reason for CRM: Called 10/08/2023 to sched AWV - NO VOICEMAIL  Nathanel Paschal; Care Guide Ambulatory Clinical Support [place] [facility] Direct Dial: [phone]

## 2023-10-23 ENCOUNTER — Ambulatory Visit: Payer: Medicare HMO | Admitting: Internal Medicine

## 2023-10-23 NOTE — Progress Notes (Deleted)
Name: [name]  Age/ Sex: 78 y.o., female   MRN/ DOB: [id number], [birth year]     PCP: Donato Schultz, DO   Reason for Endocrinology Evaluation: Type 2 Diabetes Mellitus  Initial Endocrine Consultative Visit: 01/17/2021    PATIENT IDENTIFIER: [name] is a 78 y.o. female with a past medical history of HTN, T2Dm, CAD , Osteoporosis and Hx of pancreatitis. The patient has followed with Endocrinology clinic since 4 f/09/2021 or consultative assistance with management of her diabetes.  DIABETIC HISTORY:  [name] was diagnosed with DM in 1998, has intolerance to Metformin due to Gi side effects. Her hemoglobin A1c has ranged from 9.3% in 2015, peaking at 10.1% in 2020.  She has history of Pancreatitis that she believes is caused Metformin   On her initial visit to our clinic she had an A1c of 9.4%  , she was on Jardiance,Glimepiride , Januvia and insulin mix . We stopped Glimepiride as she was not taking it, we stopped Januvia due to hx of pancreatitis. We continued jardiance and adjusted insulin mix     Switched insulin Mix to Basal/prandial insulin 05/2021   SUBJECTIVE:   During the last visit (05/21/2023): A1c 8.1%    Today (10/23/2023): [name] is here for a follow up on diabetes management. She checks her blood sugars 2-3 times daily. The patient has not had hypoglycemic episodes since the last clinic visit.  She did not bring a meter today  She continues to follow-up with GI for indigestion as well as fecal incontinence  She was seen by podiatry 12/2022  She is waiting to have right shoulder sx but waiting on A1c to be less then 8%   Denies nausea and vomiting She does have alternating bowel movements depending on what she eats    HOME DIABETES REGIMEN:  Jardiance 25 mg, 1 tablet in the morning Pioglitazone 15 mg daily Tresiba 50 units ONCE daily  Novolog 16 units with brunch and 18 units with supper Correction factor: NovoLog (BG  -130/25)      Statin: yes ACE-I/ARB: yes   CONTINUOUS GLUCOSE MONITORING RECORD INTERPRETATION    Dates of Recording: 7/31-8/13/2024  Sensor description: dexcom  Results statistics:   CGM use % of time 86  Average and SD 193/37  Time in range 35  % Time Above 180 59  % Time above 250 6  % Time Below target 0    Glycemic patterns summary: BG's remain elevated overnight and fluctuate during the day  Hyperglycemic episodes postprandial  Hypoglycemic episodes occurred n/a   Overnight periods: Mostly high     DIABETIC COMPLICATIONS: Microvascular complications:  Neuropathy  Denies: CKD, retinopathy, neuropathy Last Eye Exam: Completed 01/2022  Macrovascular complications:  CAD ( S/P stent placement)  Denies: CVA, PVD   HISTORY:  Past Medical History:  Past Medical History:  Diagnosis Date   Blockage of coronary artery of heart (HCC)    Depression    Diabetes mellitus    Hyperlipidemia    Hypertension    Pancreatitis    Restless leg syndrome    Past Surgical History:  Past Surgical History:  Procedure Laterality Date   CATARACT EXTRACTION, BILATERAL     CORONARY ANGIOPLASTY WITH STENT PLACEMENT     FOOT SURGERY Right    cyst removal   ROTATOR CUFF REPAIR     right   ROTATOR CUFF REPAIR Left    TUBAL LIGATION     Social History:  reports that  she quit smoking about 3 years ago. Her smoking use included cigarettes. She has never used smokeless tobacco. She reports that she does not drink alcohol and does not use drugs. Family History:  Family History  Problem Relation Age of Onset   Heart attack Father    Heart attack Brother    Diabetes Sister    Cirrhosis Sister    Hypertension Sister    Cancer Mother    Hypertension Mother    Vision loss Mother    Cancer Sister    Sleep apnea Sister    Heart Problems Sister    Diabetes Sister    Gout Sister    Diabetes Son    Heart attack Son    Diabetes Daughter    Goiter Daughter    Diabetes  Daughter      HOME MEDICATIONS: Allergies as of 10/23/2023       Reactions   Metformin And Related Other (See Comments)   Sick on stomach, sweaty, throw up, pain   Levemir [insulin Detemir] Hives, Swelling   Clindamycin Nausea And Vomiting   Crestor [rosuvastatin Calcium] Palpitations        Medication List        Accurate as of October 23, 2023  7:03 AM. If you have any questions, ask your nurse or doctor.          alendronate 70 MG tablet Commonly known as: FOSAMAX TAKE ONE TABLET BY MOUTH ONCE WEEKLY ON WEDNESDAY   ALPRAZolam 0.25 MG tablet Commonly known as: XANAX Take 1 tablet by mouth three times a day if needed   amLODipine 10 MG tablet Commonly known as: NORVASC TAKE ONE TABLET BY MOUTH ONCE DAILY   aspirin EC 81 MG tablet Take 81 mg by mouth daily.   atorvastatin 80 MG tablet Commonly known as: LIPITOR Take 1 tablet (80 mg total) by mouth every evening.   B-D SINGLE USE SWABS REGULAR Pads USE AS DIRECTED   buPROPion 300 MG 24 hr tablet Commonly known as: Wellbutrin XL Take 1 tablet (300 mg total) by mouth daily.   Comfort EZ Pen Needles 32G X 4 MM Misc Generic drug: Insulin Pen Needle 1 Device by Other route in the morning, at noon, in the evening, and at bedtime. as directed   Dexcom G6 Sensor Misc 1 Device by Does not apply route as directed.   empagliflozin 25 MG Tabs tablet Commonly known as: Jardiance Take 1 tablet (25 mg total) by mouth daily before breakfast.   fenofibrate 160 MG tablet TAKE ONE TABLET BY MOUTH EVERY EVENING   ferrous sulfate 325 (65 FE) MG tablet Take 325 mg by mouth daily with breakfast.   fexofenadine 180 MG tablet Commonly known as: ALLEGRA Take 180 mg by mouth daily as needed for allergies or rhinitis.   Fish Oil 1000 MG Caps Take 1 capsule by mouth in the morning and at bedtime.   FLUoxetine 40 MG capsule Commonly known as: PROZAC Take 1 capsule (40 mg total) by mouth daily.   fluticasone 50 MCG/ACT  nasal spray Commonly known as: FLONASE INSTILL 2 SPRAYS IN EACH NOSTRIL DAILY   gabapentin 300 MG capsule Commonly known as: Neurontin Take 1 capsule (300 mg total) by mouth 3 (three) times daily.   methocarbamol 500 MG tablet Commonly known as: ROBAXIN Take 1 tablet (500 mg total) by mouth 2 (two) times daily.   metoprolol succinate 50 MG 24 hr tablet Commonly known as: TOPROL-XL TAKE ONE TABLET BY MOUTH ONCE DAILY  WITH FOOD   multivitamin with minerals Tabs tablet Take 1 tablet by mouth daily.   NovoLOG FlexPen 100 UNIT/ML FlexPen Generic drug: insulin aspart Max daily 70 units   pioglitazone 15 MG tablet Commonly known as: Actos Take 1 tablet (15 mg total) by mouth daily.   THERATEARS OP Place 1 drop into both eyes daily as needed (dry eyes).   Evaristo Bury FlexTouch 100 UNIT/ML FlexTouch Pen Generic drug: insulin degludec Inject 50 Units into the skin daily.   True Metrix Blood Glucose Test test strip Generic drug: glucose blood USE AS DIRECTED 4 TIMES A DAY   valsartan 320 MG tablet Commonly known as: DIOVAN TAKE ONE TABLET BY MOUTH EVERY MORNING   vitamin E 180 MG (400 UNITS) capsule Take 400 Units by mouth daily.         OBJECTIVE:   Vital Signs: There were no vitals taken for this visit.  Wt Readings from Last 3 Encounters:  09/12/23 180 lb (81.6 kg)  07/12/23 176 lb 6.4 oz (80 kg)  07/03/23 174 lb (78.9 kg)     Exam: General: Pt appears well and is in NAD  Lungs: Clear with good BS bilat   Heart: RRR   Extremities: No pretibial edema.   Neuro: MS is good with appropriate affect, pt is alert and Ox3    DM foot exam: 12/26/2022 per podiatry    DATA REVIEWED:  Lab Results  Component Value Date   HGBA1C 9.3 (H) 07/12/2023   HGBA1C 8.9 (A) 05/21/2023   HGBA1C 8.1 (A) 12/11/2022    Latest Reference Range & Units 07/12/23 14:59  Sodium 135 - 146 mmol/L 137  Potassium 3.5 - 5.3 mmol/L 4.0  Chloride 98 - 110 mmol/L 103  CO2 20 - 32 mmol/L  25  Glucose 65 - 99 mg/dL 295 (H)  Mean Plasma Glucose mg/dL 621  BUN 7 - 25 mg/dL 29 (H)  Creatinine 3.08 - 1.00 mg/dL 6.57 (H)  Calcium 8.6 - 10.4 mg/dL 84.6  BUN/Creatinine Ratio 6 - 22 (calc) 25 (H)  AG Ratio 1.0 - 2.5 (calc) 2.0  AST 10 - 35 U/L 18  ALT 6 - 29 U/L 18  Total Protein 6.1 - 8.1 g/dL 6.3  Total Bilirubin 0.2 - 1.2 mg/dL 0.5    Latest Reference Range & Units 07/12/23 14:59  Total CHOL/HDL Ratio <5.0 (calc) 3.3  Cholesterol <200 mg/dL 962  HDL Cholesterol > OR = 50 mg/dL 36 (L)  LDL Cholesterol (Calc) mg/dL (calc) 60  MICROALB/CREAT RATIO <30 mg/g creat 77 (H)  Non-HDL Cholesterol (Calc) <130 mg/dL (calc) 84  Triglycerides <150 mg/dL 952 (H)      ASSESSMENT / PLAN / RECOMMENDATIONS:   1) Type 2 Diabetes Mellitus, poorly controlled , With macrovascular  complications - Most recent A1c of 8.9 %. Goal A1c < 7.0 %.     -Patient continues with worsening glycemic control - GLP-1 agonists and DPP-4 inhibitors are contra-indicated due to hx of pancreatitis    MEDICATIONS: -Start pioglitazone 15 mg daily -Continue Jardiance 25 mg, 1 tablet in the morning  -Increase Tresiba 50 units ONCE daily  -Continue  Novolog 16 units with brunch and continue 18 units with supper -Continue correction factor: NovoLog (BG -130/25)     EDUCATION / INSTRUCTIONS: BG monitoring instructions: Patient is instructed to check her blood sugars 3 times a day, before meals. Call [facility] if: BG persistently < 70  I reviewed the Rule of 15 for the treatment of hypoglycemia in  detail with the patient. Literature supplied.   2) Diabetic complications:  Eye: Does not have known diabetic retinopathy.  Neuro/ Feet: Does not have known diabetic peripheral neuropathy .  Renal: Patient does not have known baseline CKD. She   is  on an ACEI/ARB at present.      F/U in 4 months     Signed electronically by: Lyndle Herrlich, MD  [facility]  [facility] [address] [place], [place] [address] Phone: [phone] FAX: [phone]   CC: [facility] [address] [facility] [address] [facility] [address] [place] [address] Phone: [phone]  Fax: [phone]  Return to Endocrinology clinic as below: Future Appointments  Date Time Provider Department Center  10/23/2023  8:50 AM Arieon Corcoran, Konrad Dolores, MD [facility] None  10/29/2023  8:20 AM [facility] ANNUAL WELLNESS VISIT 1 [facility] PEC  02/12/2024  9:30 AM [facility] DX DEXA 1 [facility]

## 2023-10-29 ENCOUNTER — Ambulatory Visit (INDEPENDENT_AMBULATORY_CARE_PROVIDER_SITE_OTHER): Payer: Medicare HMO | Admitting: *Deleted

## 2023-10-29 DIAGNOSIS — Z1231 Encounter for screening mammogram for malignant neoplasm of breast: Secondary | ICD-10-CM | POA: Diagnosis not present

## 2023-10-29 DIAGNOSIS — Z0000 Encounter for general adult medical examination without abnormal findings: Secondary | ICD-10-CM | POA: Diagnosis not present

## 2023-10-29 NOTE — Progress Notes (Signed)
Subjective:   TENEA STAY is a 78 y.o. female who presents for Medicare Annual (Subsequent) preventive examination.  Visit Complete: Virtual I connected with  Virgel Manifold on 10/29/23 by a audio enabled telemedicine application and verified that I am speaking with the correct person using two identifiers.  Patient Location: Home  Provider Location: Office/Clinic  I discussed the limitations of evaluation and management by telemedicine. The patient expressed understanding and agreed to proceed.  Vital Signs: Because this visit was a virtual/telehealth visit, some criteria may be missing or patient reported. Any vitals not documented were not able to be obtained and vitals that have been documented are patient reported.   Cardiac Risk Factors include: advanced age (>52men, >64 women);diabetes mellitus;dyslipidemia;hypertension     Objective:    There were no vitals filed for this visit. There is no height or weight on file to calculate BMI.     10/29/2023    8:26 AM 07/03/2023   12:42 PM 10/24/2022    8:11 AM 11/11/2021    1:20 PM 05/04/2021    3:58 PM 09/23/2020    2:31 PM 05/12/2020   10:22 AM  Advanced Directives  Does Patient Have a Medical Advance Directive? No No No No No No No  Would patient like information on creating a medical advance directive? No - Patient declined No - Patient declined No - Patient declined   No - Patient declined No - Patient declined    Current Medications (verified) Outpatient Encounter Medications as of 10/29/2023  Medication Sig   Alcohol Swabs (B-D SINGLE USE SWABS REGULAR) PADS USE AS DIRECTED   alendronate (FOSAMAX) 70 MG tablet TAKE ONE TABLET BY MOUTH ONCE WEEKLY ON WEDNESDAY   ALPRAZolam (XANAX) 0.25 MG tablet Take 1 tablet by mouth three times a day if needed   amLODipine (NORVASC) 10 MG tablet TAKE ONE TABLET BY MOUTH ONCE DAILY   aspirin EC 81 MG tablet Take 81 mg by mouth daily.   atorvastatin (LIPITOR) 80 MG tablet Take 1  tablet (80 mg total) by mouth every evening.   buPROPion (WELLBUTRIN XL) 300 MG 24 hr tablet Take 1 tablet (300 mg total) by mouth daily.   Carboxymethylcellulose Sodium (THERATEARS OP) Place 1 drop into both eyes daily as needed (dry eyes).   Continuous Blood Gluc Sensor (DEXCOM G6 SENSOR) MISC 1 Device by Does not apply route as directed.   empagliflozin (JARDIANCE) 25 MG TABS tablet Take 1 tablet (25 mg total) by mouth daily before breakfast.   fenofibrate 160 MG tablet TAKE ONE TABLET BY MOUTH EVERY EVENING   ferrous sulfate 325 (65 FE) MG tablet Take 325 mg by mouth daily with breakfast.    fexofenadine (ALLEGRA) 180 MG tablet Take 180 mg by mouth daily as needed for allergies or rhinitis.   FLUoxetine (PROZAC) 40 MG capsule Take 1 capsule (40 mg total) by mouth daily.   fluticasone (FLONASE) 50 MCG/ACT nasal spray INSTILL 2 SPRAYS IN EACH NOSTRIL DAILY   gabapentin (NEURONTIN) 300 MG capsule Take 1 capsule (300 mg total) by mouth 3 (three) times daily.   glucose blood (TRUE METRIX BLOOD GLUCOSE TEST) test strip USE AS DIRECTED 4 TIMES A DAY   insulin aspart (NOVOLOG FLEXPEN) 100 UNIT/ML FlexPen Max daily 70 units   insulin degludec (TRESIBA FLEXTOUCH) 100 UNIT/ML FlexTouch Pen Inject 50 Units into the skin daily.   Insulin Pen Needle (COMFORT EZ PEN NEEDLES) 32G X 4 MM MISC 1 Device by Other route in the  morning, at noon, in the evening, and at bedtime. as directed   methocarbamol (ROBAXIN) 500 MG tablet Take 1 tablet (500 mg total) by mouth 2 (two) times daily.   metoprolol succinate (TOPROL-XL) 50 MG 24 hr tablet TAKE ONE TABLET BY MOUTH ONCE DAILY WITH FOOD   Multiple Vitamin (MULTIVITAMIN WITH MINERALS) TABS Take 1 tablet by mouth daily.   Omega-3 Fatty Acids (FISH OIL) 1000 MG CAPS Take 1 capsule by mouth in the morning and at bedtime.   pioglitazone (ACTOS) 15 MG tablet Take 1 tablet (15 mg total) by mouth daily.   valsartan (DIOVAN) 320 MG tablet TAKE ONE TABLET BY MOUTH EVERY  MORNING   vitamin E 400 UNIT capsule Take 400 Units by mouth daily.   No facility-administered encounter medications on file as of 10/29/2023.    Allergies (verified) Metformin and related, Levemir [insulin detemir], Clindamycin, and Crestor [rosuvastatin calcium]   History: Past Medical History:  Diagnosis Date   Blockage of coronary artery of heart (HCC)    Depression    Diabetes mellitus    Hyperlipidemia    Hypertension    Pancreatitis    Restless leg syndrome    Past Surgical History:  Procedure Laterality Date   CATARACT EXTRACTION, BILATERAL     CORONARY ANGIOPLASTY WITH STENT PLACEMENT     FOOT SURGERY Right    cyst removal   ROTATOR CUFF REPAIR     right   ROTATOR CUFF REPAIR Left    TUBAL LIGATION     Family History  Problem Relation Age of Onset   Heart attack Father    Heart attack Brother    Diabetes Sister    Cirrhosis Sister    Hypertension Sister    Cancer Mother    Hypertension Mother    Vision loss Mother    Cancer Sister    Sleep apnea Sister    Heart Problems Sister    Diabetes Sister    Gout Sister    Diabetes Son    Heart attack Son    Diabetes Daughter    Goiter Daughter    Diabetes Daughter    Social History   Socioeconomic History   Marital status: Married    Spouse name: Not on file   Number of children: Not on file   Years of education: Not on file   Highest education level: Not on file  Occupational History   Not on file  Tobacco Use   Smoking status: Former    Current packs/day: 0.00    Types: Cigarettes    Quit date: 05/05/2020    Years since quitting: 3.4   Smokeless tobacco: Never   Tobacco comments:    quit in 2021  Vaping Use   Vaping status: Never Used  Substance and Sexual Activity   Alcohol use: No   Drug use: No   Sexual activity: Not Currently  Other Topics Concern   Not on file  Social History Narrative   Exercise: seldom   Caffeine use: seldom   Social Drivers of Corporate investment banker  Strain: Low Risk  (10/29/2023)   Overall Financial Resource Strain (CARDIA)    Difficulty of Paying Living Expenses: Not very hard  Food Insecurity: No Food Insecurity (10/29/2023)   Hunger Vital Sign    Worried About Running Out of Food in the Last Year: Never true    Ran Out of Food in the Last Year: Never true  Transportation Needs: No Transportation Needs (10/29/2023)  PRAPARE - Administrator, Civil Service (Medical): No    Lack of Transportation (Non-Medical): No  Physical Activity: Inactive (10/29/2023)   Exercise Vital Sign    Days of Exercise per Week: 0 days    Minutes of Exercise per Session: 0 min  Stress: No Stress Concern Present (10/29/2023)   [employer] of Occupational Health - Occupational Stress Questionnaire    Feeling of Stress : Not at all  Social Connections: Moderately Integrated (10/29/2023)   Social Connection and Isolation Panel [NHANES]    Frequency of Communication with Friends and Family: More than three times a week    Frequency of Social Gatherings with Friends and Family: More than three times a week    Attends Religious Services: 1 to 4 times per year    Active Member of [employer] or Organizations: No    Attends Banker Meetings: Never    Marital Status: Married    Tobacco Counseling Counseling given: Not Answered Tobacco comments: quit in 2021   Clinical Intake:  Pre-visit preparation completed: Yes  Pain : No/denies pain  Nutritional Risks: None Diabetes: Yes CBG done?: No Did pt. bring in CBG monitor from home?: No  How often do you need to have someone help you when you read instructions, pamphlets, or other written materials from your doctor or pharmacy?: 1 - Never  Interpreter Needed?: No  Information entered by :: Donne Anon, CMA   Activities of Daily Living    10/29/2023    8:23 AM  In your present state of health, do you have any difficulty performing the following activities:  Hearing? 1   Vision? 1  Difficulty concentrating or making decisions? 0  Walking or climbing stairs? 0  Dressing or bathing? 0  Doing errands, shopping? 0  Preparing Food and eating ? N  Using the Toilet? N  In the past six months, have you accidently leaked urine? N  Do you have problems with loss of bowel control? Y  Comment every once in a while  Managing your Medications? N  Managing your Finances? N  Housekeeping or managing your Housekeeping? N    Patient Care Team: [name], [name], DO as PCP - General Jodelle Red, MD as PCP - Cardiology (Cardiology) Coletta Memos, MD as Consulting Physician (Neurosurgery) Mateo Flow, MD as Consulting Physician (Ophthalmology) [name], RPH-CPP (Pharmacist) [facility], Konrad Dolores, MD as Consulting Physician (Endocrinology)  Indicate any recent Medical Services you may have received from other than [facility] providers in the past year (date may be approximate).     Assessment:   This is a routine wellness examination for [place].  Hearing/Vision screen No results found.   Goals Addressed   None    Depression Screen    10/29/2023    8:30 AM 07/12/2023    2:57 PM 04/15/2023    1:54 PM 11/19/2022   10:20 AM 10/24/2022    8:07 AM 05/18/2022    2:07 PM 10/14/2020    8:54 AM  PHQ 2/9 Scores  PHQ - 2 Score 0 4 2 0 0 2 6  PHQ- 9 Score  8 3 3  7 16     Fall Risk    10/29/2023    8:23 AM 07/12/2023    2:56 PM 10/24/2022    8:11 AM 05/18/2022    2:07 PM 06/20/2021    3:31 PM  Fall Risk   Falls in the past year? 0 0 0 0 0  Number falls in  past yr: 0 0 0 0 0  Injury with Fall? 0 0 0 0 0  Risk for fall due to : No Fall Risks  Impaired balance/gait    Follow up Falls evaluation completed Falls evaluation completed Falls prevention discussed Falls evaluation completed Falls evaluation completed    MEDICARE RISK AT HOME: Medicare Risk at Home Any stairs in or around the home?: No If so, are there any without handrails?:  No Home free of loose throw rugs in walkways, pet beds, electrical cords, etc?: Yes Adequate lighting in your home to reduce risk of falls?: Yes Life alert?: No Use of a cane, walker or w/c?: No Grab bars in the bathroom?: No Shower chair or bench in shower?: Yes Elevated toilet seat or a handicapped toilet?: No  TIMED UP AND GO:  Was the test performed?  No    Cognitive Function:    04/01/2017    9:12 AM  MMSE - Mini Mental State Exam  Orientation to time 5  Orientation to Place 5  Registration 3  Attention/ Calculation 5  Recall 2  Language- name 2 objects 2  Language- repeat 1  Language- follow 3 step command 3  Language- read & follow direction 1  Write a sentence 1  Copy design 1  Total score 29        10/29/2023    8:32 AM 10/24/2022    8:12 AM 05/11/2019    1:17 PM  6CIT Screen  What Year? 0 points 0 points 0 points  What month? 0 points 0 points 0 points  What time? 0 points 0 points 0 points  Count back from 20 0 points 0 points 0 points  Months in reverse 0 points 0 points 0 points  Repeat phrase 4 points 0 points 0 points  Total Score 4 points 0 points 0 points    Immunizations Immunization History  Administered Date(s) Administered   Fluad Quad(high Dose 65+) 07/28/2019, 06/20/2021   Fluad Trivalent(High Dose 65+) 07/12/2023   Influenza Split 08/08/2012   Influenza, High Dose Seasonal PF 08/09/2014, 07/05/2015, 07/21/2018   PFIZER(Purple Top)SARS-COV-2 Vaccination 12/07/2019, 01/04/2020   PPD Test 08/27/2011, 09/04/2011, 07/13/2014, 09/23/2015   Pneumococcal Conjugate-13 08/09/2014   Pneumococcal Polysaccharide-23 11/17/2013   Td 03/11/2007   Zoster, Live 03/11/2007    TDAP status: Due, Education has been provided regarding the importance of this vaccine. Advised may receive this vaccine at local pharmacy or Health Dept. Aware to provide a copy of the vaccination record if obtained from local pharmacy or Health Dept. Verbalized acceptance and  understanding.  Flu Vaccine status: Up to date  Pneumococcal vaccine status: Up to date  Covid-19 vaccine status: Information provided on how to obtain vaccines.   Qualifies for Shingles Vaccine? Yes   Zostavax completed Yes   Shingrix Completed?: No.    Education has been provided regarding the importance of this vaccine. Patient has been advised to call insurance company to determine out of pocket expense if they have not yet received this vaccine. Advised may also receive vaccine at local pharmacy or Health Dept. Verbalized acceptance and understanding.  Screening Tests Health Maintenance  Topic Date Due   Zoster Vaccines- Shingrix (1 of 2) 01/09/1996   DTaP/Tdap/Td (2 - Tdap) 03/10/2017   COVID-19 Vaccine (3 - 2024-25 season) 06/09/2023   OPHTHALMOLOGY EXAM  07/18/2023   Medicare Annual Wellness (AWV)  10/25/2023   FOOT EXAM  12/26/2023   HEMOGLOBIN A1C  01/10/2024   Diabetic kidney evaluation - eGFR  measurement  07/11/2024   Diabetic kidney evaluation - Urine ACR  07/11/2024   Pneumonia Vaccine 62+ Years old  Completed   INFLUENZA VACCINE  Completed   DEXA SCAN  Completed   Hepatitis C Screening  Completed   HPV VACCINES  Aged Out   Fecal DNA (Cologuard)  Discontinued    Health Maintenance  Health Maintenance Due  Topic Date Due   Zoster Vaccines- Shingrix (1 of 2) 01/09/1996   DTaP/Tdap/Td (2 - Tdap) 03/10/2017   COVID-19 Vaccine (3 - 2024-25 season) 06/09/2023   OPHTHALMOLOGY EXAM  07/18/2023   Medicare Annual Wellness (AWV)  10/25/2023    Colorectal cancer screening: No longer required.   Mammogram status: Ordered 10/29/23. Pt provided with contact info and advised to call to schedule appt.   Bone Density status: Completed 05/29/17. Results reflect: Bone density results: OSTEOPOROSIS. Repeat every 2 years. Scheduled for dexa on 02/12/24  Lung Cancer Screening: (Low Dose CT Chest recommended if Age 25-80 years, 20 pack-year currently smoking OR have quit w/in  15years.) does not qualify.   Additional Screening:  Hepatitis C Screening: does qualify; Completed 10/21/18  Vision Screening: Recommended annual ophthalmology exams for early detection of glaucoma and other disorders of the eye. Is the patient up to date with their annual eye exam?  No  Who is the provider or what is the name of the office in which the patient attends annual eye exams? Dr. Elmer Picker If pt is not established with a provider, would they like to be referred to a provider to establish care? No .   Dental Screening: Recommended annual dental exams for proper oral hygiene  Diabetic Foot Exam: Diabetic Foot Exam: Completed 12/26/22  Community Resource Referral / Chronic Care Management: CRR required this visit?  No   CCM required this visit?  No     Plan:     I have personally reviewed and noted the following in the patient's chart:   Medical and social history Use of alcohol, tobacco or illicit drugs  Current medications and supplements including opioid prescriptions. Patient is not currently taking opioid prescriptions. Functional ability and status Nutritional status Physical activity Advanced directives List of other physicians Hospitalizations, surgeries, and ER visits in previous 12 months Vitals Screenings to include cognitive, depression, and falls Referrals and appointments  In addition, I have reviewed and discussed with patient certain preventive protocols, quality metrics, and best practice recommendations. A written personalized care plan for preventive services as well as general preventive health recommendations were provided to patient.     Donne Anon, CMA   10/29/2023   After Visit Summary: (Declined) Due to this being a telephonic visit, with patients personalized plan was offered to patient but patient Declined AVS at this time   Nurse Notes: None

## 2023-10-29 NOTE — Patient Instructions (Signed)
[name] , Thank you for taking time to come for your Medicare Wellness Visit. I appreciate your ongoing commitment to your health goals. Please review the following plan we discussed and let me know if I can assist you in the future.     This is a list of the screening recommended for you and due dates:  Health Maintenance  Topic Date Due   Zoster (Shingles) Vaccine (1 of 2) 01/09/1996   DTaP/Tdap/Td vaccine (2 - Tdap) 03/10/2017   COVID-19 Vaccine (3 - 2024-25 season) 06/09/2023   Eye exam for diabetics  07/18/2023   Complete foot exam   12/26/2023   Hemoglobin A1C  01/10/2024   Yearly kidney function blood test for diabetes  07/11/2024   Yearly kidney health urinalysis for diabetes  07/11/2024   Medicare Annual Wellness Visit  10/28/2024   Pneumonia Vaccine  Completed   Flu Shot  Completed   DEXA scan (bone density measurement)  Completed   Hepatitis C Screening  Completed   HPV Vaccine  Aged Out   Cologuard (Stool DNA test)  Discontinued    Next appointment: Follow up in one year for your annual wellness visit.   Preventive Care 67 Years and Older, Female Preventive care refers to lifestyle choices and visits with your health care provider that can promote health and wellness. What does preventive care include? A yearly physical exam. This is also called an annual well check. Dental exams once or twice a year. Routine eye exams. Ask your health care provider how often you should have your eyes checked. Personal lifestyle choices, including: Daily care of your teeth and gums. Regular physical activity. Eating a healthy diet. Avoiding tobacco and drug use. Limiting alcohol use. Practicing safe sex. Taking low-dose aspirin every day. Taking vitamin and mineral supplements as recommended by your health care provider. What happens during an annual well check? The services and screenings done by your health care provider during your annual well check will depend on your  age, overall health, lifestyle risk factors, and family history of disease. Counseling  Your health care provider may ask you questions about your: Alcohol use. Tobacco use. Drug use. Emotional well-being. Home and relationship well-being. Sexual activity. Eating habits. History of falls. Memory and ability to understand (cognition). Work and work Astronomer. Reproductive health. Screening  You may have the following tests or measurements: Height, weight, and BMI. Blood pressure. Lipid and cholesterol levels. These may be checked every 5 years, or more frequently if you are over 23 years old. Skin check. Lung cancer screening. You may have this screening every year starting at age 79 if you have a 30-pack-year history of smoking and currently smoke or have quit within the past 15 years. Fecal occult blood test (FOBT) of the stool. You may have this test every year starting at age 8. Flexible sigmoidoscopy or colonoscopy. You may have a sigmoidoscopy every 5 years or a colonoscopy every 10 years starting at age 45. Hepatitis C blood test. Hepatitis B blood test. Sexually transmitted disease (STD) testing. Diabetes screening. This is done by checking your blood sugar (glucose) after you have not eaten for a while (fasting). You may have this done every 1-3 years. Bone density scan. This is done to screen for osteoporosis. You may have this done starting at age 11. Mammogram. This may be done every 1-2 years. Talk to your health care provider about how often you should have regular mammograms. Talk with your health care provider about your  test results, treatment options, and if necessary, the need for more tests. Vaccines  Your health care provider may recommend certain vaccines, such as: Influenza vaccine. This is recommended every year. Tetanus, diphtheria, and acellular pertussis (Tdap, Td) vaccine. You may need a Td booster every 10 years. Zoster vaccine. You may need this after  age 62. Pneumococcal 13-valent conjugate (PCV13) vaccine. One dose is recommended after age 64. Pneumococcal polysaccharide (PPSV23) vaccine. One dose is recommended after age 59. Talk to your health care provider about which screenings and vaccines you need and how often you need them. This information is not intended to replace advice given to you by your health care provider. Make sure you discuss any questions you have with your health care provider. Document Released: 10/21/2015 Document Revised: 06/13/2016 Document Reviewed: 07/26/2015 Elsevier Interactive Patient Education  2017 [employer].  Fall Prevention in the Home Falls can cause injuries. They can happen to people of all ages. There are many things you can do to make your home safe and to help prevent falls. What can I do on the outside of my home? Regularly fix the edges of walkways and driveways and fix any cracks. Remove anything that might make you trip as you walk through a door, such as a raised step or threshold. Trim any bushes or trees on the path to your home. Use bright outdoor lighting. Clear any walking paths of anything that might make someone trip, such as rocks or tools. Regularly check to see if handrails are loose or broken. Make sure that both sides of any steps have handrails. Any raised decks and porches should have guardrails on the edges. Have any leaves, snow, or ice cleared regularly. Use sand or salt on walking paths during winter. Clean up any spills in your garage right away. This includes oil or grease spills. What can I do in the bathroom? Use night lights. Install grab bars by the toilet and in the tub and shower. Do not use towel bars as grab bars. Use non-skid mats or decals in the tub or shower. If you need to sit down in the shower, use a plastic, non-slip stool. Keep the floor dry. Clean up any water that spills on the floor as soon as it happens. Remove soap buildup in the tub or shower  regularly. Attach bath mats securely with double-sided non-slip rug tape. Do not have throw rugs and other things on the floor that can make you trip. What can I do in the bedroom? Use night lights. Make sure that you have a light by your bed that is easy to reach. Do not use any sheets or blankets that are too big for your bed. They should not hang down onto the floor. Have a firm chair that has side arms. You can use this for support while you get dressed. Do not have throw rugs and other things on the floor that can make you trip. What can I do in the kitchen? Clean up any spills right away. Avoid walking on wet floors. Keep items that you use a lot in easy-to-reach places. If you need to reach something above you, use a strong step stool that has a grab bar. Keep electrical cords out of the way. Do not use floor polish or wax that makes floors slippery. If you must use wax, use non-skid floor wax. Do not have throw rugs and other things on the floor that can make you trip. What can I do with my  stairs? Do not leave any items on the stairs. Make sure that there are handrails on both sides of the stairs and use them. Fix handrails that are broken or loose. Make sure that handrails are as long as the stairways. Check any carpeting to make sure that it is firmly attached to the stairs. Fix any carpet that is loose or worn. Avoid having throw rugs at the top or bottom of the stairs. If you do have throw rugs, attach them to the floor with carpet tape. Make sure that you have a light switch at the top of the stairs and the bottom of the stairs. If you do not have them, ask someone to add them for you. What else can I do to help prevent falls? Wear shoes that: Do not have high heels. Have rubber bottoms. Are comfortable and fit you well. Are closed at the toe. Do not wear sandals. If you use a stepladder: Make sure that it is fully opened. Do not climb a closed stepladder. Make sure that  both sides of the stepladder are locked into place. Ask someone to hold it for you, if possible. Clearly mark and make sure that you can see: Any grab bars or handrails. First and last steps. Where the edge of each step is. Use tools that help you move around (mobility aids) if they are needed. These include: Canes. Walkers. Scooters. Crutches. Turn on the lights when you go into a dark area. Replace any light bulbs as soon as they burn out. Set up your furniture so you have a clear path. Avoid moving your furniture around. If any of your floors are uneven, fix them. If there are any pets around you, be aware of where they are. Review your medicines with your doctor. Some medicines can make you feel dizzy. This can increase your chance of falling. Ask your doctor what other things that you can do to help prevent falls. This information is not intended to replace advice given to you by your health care provider. Make sure you discuss any questions you have with your health care provider. Document Released: 07/21/2009 Document Revised: 03/01/2016 Document Reviewed: 10/29/2014 Elsevier Interactive Patient Education  2017 [employer].

## 2023-11-04 ENCOUNTER — Ambulatory Visit: Payer: Medicare HMO | Admitting: Internal Medicine

## 2023-11-04 VITALS — BP 132/84 | HR 74 | Ht 65.0 in | Wt 185.0 lb

## 2023-11-04 DIAGNOSIS — Z794 Long term (current) use of insulin: Secondary | ICD-10-CM | POA: Diagnosis not present

## 2023-11-04 DIAGNOSIS — E1159 Type 2 diabetes mellitus with other circulatory complications: Secondary | ICD-10-CM | POA: Diagnosis not present

## 2023-11-04 DIAGNOSIS — E1165 Type 2 diabetes mellitus with hyperglycemia: Secondary | ICD-10-CM | POA: Diagnosis not present

## 2023-11-04 LAB — POCT GLYCOSYLATED HEMOGLOBIN (HGB A1C): Hemoglobin A1C: 8.2 % — AB (ref 4.0–5.6)

## 2023-11-04 MED ORDER — PIOGLITAZONE HCL 30 MG PO TABS: 30.0000 mg | ORAL_TABLET | Freq: Every day | ORAL | 3 refills | Status: DC

## 2023-11-04 NOTE — Patient Instructions (Addendum)
-   Increase  Actos ( Pioglitazone )30 mg daily  - Continue Jardiance 25 mg, 1 tablet in the morning  - Tresiba 44 units ONCE daily  - Continue  Novolog 16 units with Brunch , 18 units with Supper  -Novolog correctional insulin: ADD extra units on insulin to your meal-time Novolog dose if your blood sugars are higher than 155. Use the scale below to help guide you:   Blood sugar before meal Number of units to inject  Less than 155 0 unit  156 -  180 1 units  181 -  205 2 units  206 -  230 3 units  231 -  255 4 units  256 -  280 5 units  281 -  305 6 units  306 -  330 7 units  331 -  355 8 units         HOW TO TREAT LOW BLOOD SUGARS (Blood sugar LESS THAN 70 MG/DL) Please follow the RULE OF 15 for the treatment of hypoglycemia treatment (when your (blood sugars are less than 70 mg/dL)   STEP 1: Take 15 grams of carbohydrates when your blood sugar is low, which includes:  3-4 GLUCOSE TABS  OR 3-4 OZ OF JUICE OR REGULAR SODA OR ONE TUBE OF GLUCOSE GEL    STEP 2: RECHECK blood sugar in 15 MINUTES STEP 3: If your blood sugar is still low at the 15 minute recheck --> then, go back to STEP 1 and treat AGAIN with another 15 grams of carbohydrates

## 2023-11-04 NOTE — Progress Notes (Signed)
Name: [name]  Age/ Sex: 78 y.o., female   MRN/ DOB: [id number], [birth year]     PCP: Donato Schultz, DO   Reason for Endocrinology Evaluation: Type 2 Diabetes Mellitus  Initial Endocrine Consultative Visit: 01/17/2021    PATIENT IDENTIFIER: [name] is a 78 y.o. female with a past medical history of HTN, T2Dm, CAD , Osteoporosis and Hx of pancreatitis. The patient has followed with Endocrinology clinic since 4 f/09/2021 or consultative assistance with management of her diabetes.  DIABETIC HISTORY:  [name] was diagnosed with DM in 1998, has intolerance to Metformin due to Gi side effects. Her hemoglobin A1c has ranged from 9.3% in 2015, peaking at 10.1% in 2020.  She has history of Pancreatitis that she believes is caused Metformin   On her initial visit to our clinic she had an A1c of 9.4%  , she was on Jardiance,Glimepiride , Januvia and insulin mix . We stopped Glimepiride as she was not taking it, we stopped Januvia due to hx of pancreatitis. We continued jardiance and adjusted insulin mix     Switched insulin Mix to Basal/prandial insulin 05/2021   SUBJECTIVE:   During the last visit (05/21/2023): A1c 8.1%    Today (11/04/2023): [name] is here for a follow up on diabetes management. She checks her blood sugars 2-3 times daily. The patient has not had hypoglycemic episodes since the last clinic visit.    Denies nausea and vomiting Alternating bowel movements have improved, noted to be worse with diet soda  Has left LE edema last week, that has resolved    HOME DIABETES REGIMEN:  Jardiance 25 mg, 1 tablet in the morning Pioglitazone 15 mg daily Tresiba 44 units ONCE daily  Novolog 16 units with brunch and 18 units with supper Correction factor: NovoLog (BG -130/25)     Statin: yes ACE-I/ARB: yes   CONTINUOUS GLUCOSE MONITORING RECORD INTERPRETATION    Dates of Recording: 1/14-1/27/2025  Sensor description: dexcom  Results  statistics:   CGM use % of time 94  Average and SD 187/53  Time in range 44  % Time Above 180 45  % Time above 250 11  % Time Below target 0    Glycemic patterns summary:   Hyperglycemic episodes postprandial  Hypoglycemic episodes occurred n/a   Overnight periods: Mostly high     DIABETIC COMPLICATIONS: Microvascular complications:  Neuropathy  Denies: CKD, retinopathy, neuropathy Last Eye Exam: Completed 01/2022  Macrovascular complications:  CAD ( S/P stent placement)  Denies: CVA, PVD   HISTORY:  Past Medical History:  Past Medical History:  Diagnosis Date   Blockage of coronary artery of heart (HCC)    Depression    Diabetes mellitus    Hyperlipidemia    Hypertension    Pancreatitis    Restless leg syndrome    Past Surgical History:  Past Surgical History:  Procedure Laterality Date   CATARACT EXTRACTION, BILATERAL     CORONARY ANGIOPLASTY WITH STENT PLACEMENT     FOOT SURGERY Right    cyst removal   ROTATOR CUFF REPAIR     right   ROTATOR CUFF REPAIR Left    TUBAL LIGATION     Social History:  reports that she quit smoking about 3 years ago. Her smoking use included cigarettes. She has never used smokeless tobacco. She reports that she does not drink alcohol and does not use drugs. Family History:  Family History  Problem Relation Age of Onset  Heart attack Father    Heart attack Brother    Diabetes Sister    Cirrhosis Sister    Hypertension Sister    Cancer Mother    Hypertension Mother    Vision loss Mother    Cancer Sister    Sleep apnea Sister    Heart Problems Sister    Diabetes Sister    Gout Sister    Diabetes Son    Heart attack Son    Diabetes Daughter    Goiter Daughter    Diabetes Daughter      HOME MEDICATIONS: Allergies as of 11/04/2023       Reactions   Metformin And Related Other (See Comments)   Sick on stomach, sweaty, throw up, pain   Levemir [insulin Detemir] Hives, Swelling   Clindamycin Nausea And  Vomiting   Crestor [rosuvastatin Calcium] Palpitations        Medication List        Accurate as of November 04, 2023  8:27 AM. If you have any questions, ask your nurse or doctor.          alendronate 70 MG tablet Commonly known as: FOSAMAX TAKE ONE TABLET BY MOUTH ONCE WEEKLY ON WEDNESDAY   ALPRAZolam 0.25 MG tablet Commonly known as: XANAX Take 1 tablet by mouth three times a day if needed   amLODipine 10 MG tablet Commonly known as: NORVASC TAKE ONE TABLET BY MOUTH ONCE DAILY   aspirin EC 81 MG tablet Take 81 mg by mouth daily.   atorvastatin 80 MG tablet Commonly known as: LIPITOR Take 1 tablet (80 mg total) by mouth every evening.   B-D SINGLE USE SWABS REGULAR Pads USE AS DIRECTED   buPROPion 300 MG 24 hr tablet Commonly known as: Wellbutrin XL Take 1 tablet (300 mg total) by mouth daily.   Comfort EZ Pen Needles 32G X 4 MM Misc Generic drug: Insulin Pen Needle 1 Device by Other route in the morning, at noon, in the evening, and at bedtime. as directed   Dexcom G6 Sensor Misc 1 Device by Does not apply route as directed.   empagliflozin 25 MG Tabs tablet Commonly known as: Jardiance Take 1 tablet (25 mg total) by mouth daily before breakfast.   fenofibrate 160 MG tablet TAKE ONE TABLET BY MOUTH EVERY EVENING   ferrous sulfate 325 (65 FE) MG tablet Take 325 mg by mouth daily with breakfast.   fexofenadine 180 MG tablet Commonly known as: ALLEGRA Take 180 mg by mouth daily as needed for allergies or rhinitis.   Fish Oil 1000 MG Caps Take 1 capsule by mouth in the morning and at bedtime.   FLUoxetine 40 MG capsule Commonly known as: PROZAC Take 1 capsule (40 mg total) by mouth daily.   fluticasone 50 MCG/ACT nasal spray Commonly known as: FLONASE INSTILL 2 SPRAYS IN EACH NOSTRIL DAILY   gabapentin 300 MG capsule Commonly known as: Neurontin Take 1 capsule (300 mg total) by mouth 3 (three) times daily.   methocarbamol 500 MG  tablet Commonly known as: ROBAXIN Take 1 tablet (500 mg total) by mouth 2 (two) times daily.   metoprolol succinate 50 MG 24 hr tablet Commonly known as: TOPROL-XL TAKE ONE TABLET BY MOUTH ONCE DAILY WITH FOOD   multivitamin with minerals Tabs tablet Take 1 tablet by mouth daily.   NovoLOG FlexPen 100 UNIT/ML FlexPen Generic drug: insulin aspart Max daily 70 units   pantoprazole 40 MG tablet Commonly known as: PROTONIX Take 40 mg by  mouth daily.   pioglitazone 30 MG tablet Commonly known as: ACTOS Take 1 tablet (30 mg total) by mouth daily. What changed:  medication strength how much to take   [facility] OP Place 1 drop into both eyes daily as needed (dry eyes).   Evaristo Bury FlexTouch 100 UNIT/ML FlexTouch Pen Generic drug: insulin degludec Inject 50 Units into the skin daily. What changed: how much to take   True Metrix Blood Glucose Test test strip Generic drug: glucose blood USE AS DIRECTED 4 TIMES A DAY   valsartan 320 MG tablet Commonly known as: DIOVAN TAKE ONE TABLET BY MOUTH EVERY MORNING   vitamin E 180 MG (400 UNITS) capsule Take 400 Units by mouth daily.         OBJECTIVE:   Vital Signs: BP 132/84 (BP Location: Left Arm, Patient Position: Sitting, Cuff Size: Normal)   Pulse 74   Ht 5\' 5"  (1.651 m)   Wt 185 lb (83.9 kg)   SpO2 96%   BMI 30.79 kg/m   Wt Readings from Last 3 Encounters:  11/04/23 185 lb (83.9 kg)  09/12/23 180 lb (81.6 kg)  07/12/23 176 lb 6.4 oz (80 kg)     Exam: General: Pt appears well and is in NAD  Lungs: Clear with good BS bilat   Heart: RRR   Extremities: No pretibial edema.   Neuro: MS is good with appropriate affect, pt is alert and Ox3    DM foot exam: 11/04/2023  The skin of the feet is intact without sores or ulcerations. The pedal pulses are 2+ on right and 2+ on left. The sensation is intact to a screening 5.07, 10 gram monofilament bilaterally     DATA REVIEWED:  Lab Results  Component Value Date    HGBA1C 8.2 (A) 11/04/2023   HGBA1C 9.3 (H) 07/12/2023   HGBA1C 8.9 (A) 05/21/2023    Latest Reference Range & Units 07/12/23 14:59  Sodium 135 - 146 mmol/L 137  Potassium 3.5 - 5.3 mmol/L 4.0  Chloride 98 - 110 mmol/L 103  CO2 20 - 32 mmol/L 25  Glucose 65 - 99 mg/dL 161 (H)  Mean Plasma Glucose mg/dL 096  BUN 7 - 25 mg/dL 29 (H)  Creatinine 0.45 - 1.00 mg/dL 4.09 (H)  Calcium 8.6 - 10.4 mg/dL 81.1  BUN/Creatinine Ratio 6 - 22 (calc) 25 (H)  AG Ratio 1.0 - 2.5 (calc) 2.0  AST 10 - 35 U/L 18  ALT 6 - 29 U/L 18  Total Protein 6.1 - 8.1 g/dL 6.3  Total Bilirubin 0.2 - 1.2 mg/dL 0.5    Latest Reference Range & Units 07/12/23 14:59  Total CHOL/HDL Ratio <5.0 (calc) 3.3  Cholesterol <200 mg/dL 914  HDL Cholesterol > OR = 50 mg/dL 36 (L)  LDL Cholesterol (Calc) mg/dL (calc) 60  MICROALB/CREAT RATIO <30 mg/g creat 77 (H)  Non-HDL Cholesterol (Calc) <130 mg/dL (calc) 84  Triglycerides <150 mg/dL 782 (H)      ASSESSMENT / PLAN / RECOMMENDATIONS:   1) Type 2 Diabetes Mellitus, poorly controlled , With macrovascular  complications - Most recent A1c of 8.9 %. Goal A1c < 7.0 %.      - GLP-1 agonists and DPP-4 inhibitors are contra-indicated due to hx of pancreatitis  -A referral to our CDE has been placed for the appropriate training on insulin regimen intake.  For example last night she was out for dinner and did not take her NovoLog until after she got home and added 2 additional per  correction scale, there is no hypoglycemia but her BG's did end up being very tight. -I again explained to the patient the importance of taking NovoLog before the meal rather than after the meal, and the importance of using correction scale for Premeal glucose readings rather than postmeal glucose readings -I will increase pioglitazone -She continues to take less [place] than previously prescribed  MEDICATIONS: -Increase pioglitazone 30 mg daily -Continue Jardiance 25 mg, 1 tablet in the morning   -Continue Tresiba 44 units ONCE daily  -Continue  Novolog 16 units with brunch and continue 18 units with supper -Continue correction factor: NovoLog (BG -130/25)     EDUCATION / INSTRUCTIONS: BG monitoring instructions: Patient is instructed to check her blood sugars 3 times a day, before meals. Call [facility] if: BG persistently < 70  I reviewed the Rule of 15 for the treatment of hypoglycemia in detail with the patient. Literature supplied.   2) Diabetic complications:  Eye: Does not have known diabetic retinopathy.  Neuro/ Feet: Does not have known diabetic peripheral neuropathy .  Renal: Patient does not have known baseline CKD. She   is  on an ACEI/ARB at present.      F/U in 3 months     Signed electronically by: Lyndle Herrlich, MD  [facility]  [facility] [address] [place], [place] [address] Phone: [phone] FAX: [phone]   CC: Virgina Organ [address] [place] [address] Phone: [phone]  Fax: [phone]  Return to Endocrinology clinic as below: Future Appointments  Date Time Provider Department Center  02/12/2024  9:30 AM [facility] DX DEXA 1 [facility]

## 2023-11-05 ENCOUNTER — Encounter: Payer: Self-pay | Admitting: Internal Medicine

## 2023-12-04 LAB — HM DIABETES EYE EXAM

## 2023-12-26 ENCOUNTER — Ambulatory Visit: Payer: Medicare HMO | Admitting: Dietician

## 2023-12-30 ENCOUNTER — Other Ambulatory Visit: Payer: Self-pay

## 2023-12-30 DIAGNOSIS — E1165 Type 2 diabetes mellitus with hyperglycemia: Secondary | ICD-10-CM

## 2023-12-30 MED ORDER — TRESIBA FLEXTOUCH 100 UNIT/ML ~~LOC~~ SOPN: 44.0000 [IU] | PEN_INJECTOR | Freq: Every day | SUBCUTANEOUS | 1 refills | Status: DC

## 2024-01-06 ENCOUNTER — Telehealth: Payer: Self-pay | Admitting: Internal Medicine

## 2024-01-06 NOTE — Telephone Encounter (Signed)
 New message    1. Which medications need to be refilled? (please list name of each medication and dose if known) Continuous Blood Gluc Sensor (DEXCOM G6 SENSOR) MISC    2. Which pharmacy/location (including street and city if local pharmacy) is medication to be sent to?  [facility] (New Address) - [place], [place] - [address] AT Previously: [place] Phone: [phone]  Fax: [phone]       3. Do they need a 30 day or 90 day supply?  30 days supply

## 2024-01-07 ENCOUNTER — Other Ambulatory Visit: Payer: Self-pay | Admitting: Internal Medicine

## 2024-01-07 MED ORDER — DEXCOM G6 SENSOR MISC: 1.0000 | 3 refills | Status: DC

## 2024-01-07 NOTE — Telephone Encounter (Signed)
 Done

## 2024-01-09 ENCOUNTER — Other Ambulatory Visit: Payer: Self-pay

## 2024-01-09 ENCOUNTER — Telehealth: Payer: Self-pay | Admitting: Dietician

## 2024-01-09 MED ORDER — DEXCOM G6 RECEIVER DEVI: 0 refills | Status: DC

## 2024-01-09 MED ORDER — DEXCOM G6 TRANSMITTER MISC: 3 refills | Status: DC

## 2024-01-09 NOTE — Telephone Encounter (Signed)
 Patient called and states that she needs a prescription refill for the Dexcom G6 sensors and transmitter.  Sent to [facility].  Oran Rein, RD, LDN, CDCES, DipACLM

## 2024-01-09 NOTE — Telephone Encounter (Signed)
 I have already spoke with [facility] this morning and resent prescription again

## 2024-02-11 ENCOUNTER — Ambulatory Visit: Payer: Medicare HMO | Admitting: Internal Medicine

## 2024-02-12 ENCOUNTER — Other Ambulatory Visit: Payer: Medicare HMO

## 2024-02-13 ENCOUNTER — Ambulatory Visit: Admitting: Dietician

## 2024-02-27 ENCOUNTER — Ambulatory Visit
Admission: RE | Admit: 2024-02-27 | Discharge: 2024-02-27 | Disposition: A | Discharge status: Home / Self Care | Source: Ambulatory Visit | Attending: Family Medicine | Admitting: Family Medicine

## 2024-02-27 DIAGNOSIS — Z1231 Encounter for screening mammogram for malignant neoplasm of breast: Secondary | ICD-10-CM | POA: Diagnosis not present

## 2024-03-04 ENCOUNTER — Telehealth: Payer: Self-pay | Admitting: *Deleted

## 2024-03-04 DIAGNOSIS — E1169 Type 2 diabetes mellitus with other specified complication: Secondary | ICD-10-CM

## 2024-03-04 MED ORDER — ATORVASTATIN CALCIUM 80 MG PO TABS: 80.0000 mg | ORAL_TABLET | Freq: Every evening | ORAL | 1 refills | Status: AC

## 2024-03-04 NOTE — Telephone Encounter (Signed)
 Received refill request from [facility] for Atorvastatin  80mg  one tablet every evening. Refill sent.

## 2024-03-09 ENCOUNTER — Ambulatory Visit (INDEPENDENT_AMBULATORY_CARE_PROVIDER_SITE_OTHER): Admitting: Family Medicine

## 2024-03-09 ENCOUNTER — Encounter: Payer: Self-pay | Admitting: Family Medicine

## 2024-03-09 VITALS — BP 178/53 | HR 59 | Temp 97.8°F | Resp 16 | Ht 65.0 in | Wt 182.0 lb

## 2024-03-09 DIAGNOSIS — B350 Tinea barbae and tinea capitis: Secondary | ICD-10-CM

## 2024-03-09 DIAGNOSIS — R051 Acute cough: Secondary | ICD-10-CM

## 2024-03-09 DIAGNOSIS — I10 Essential (primary) hypertension: Secondary | ICD-10-CM | POA: Diagnosis not present

## 2024-03-09 DIAGNOSIS — J0140 Acute pansinusitis, unspecified: Secondary | ICD-10-CM | POA: Diagnosis not present

## 2024-03-09 DIAGNOSIS — R300 Dysuria: Secondary | ICD-10-CM | POA: Diagnosis not present

## 2024-03-09 DIAGNOSIS — F418 Other specified anxiety disorders: Secondary | ICD-10-CM

## 2024-03-09 DIAGNOSIS — K219 Gastro-esophageal reflux disease without esophagitis: Secondary | ICD-10-CM

## 2024-03-09 DIAGNOSIS — R5383 Other fatigue: Secondary | ICD-10-CM

## 2024-03-09 LAB — POC URINALSYSI DIPSTICK (AUTOMATED)
Bilirubin, UA: NEGATIVE
Blood, UA: NEGATIVE
Glucose, UA: POSITIVE — AB
Ketones, UA: NEGATIVE
Leukocytes, UA: NEGATIVE
Nitrite, UA: POSITIVE
Protein, UA: POSITIVE — AB
Spec Grav, UA: 1.01 (ref 1.010–1.025)
Urobilinogen, UA: 0.2 U/dL
pH, UA: 5 (ref 5.0–8.0)

## 2024-03-09 MED ORDER — AMOXICILLIN-POT CLAVULANATE 875-125 MG PO TABS
1.0000 | ORAL_TABLET | Freq: Two times a day (BID) | ORAL | 0 refills | Status: DC
Start: 2024-03-09 — End: 2024-05-26

## 2024-03-09 MED ORDER — CICLOPIROX 1 % EX SHAM
MEDICATED_SHAMPOO | CUTANEOUS | 0 refills | Status: AC
Start: 2024-03-09 — End: ?

## 2024-03-09 MED ORDER — PROMETHAZINE-DM 6.25-15 MG/5ML PO SYRP
5.0000 mL | ORAL_SOLUTION | Freq: Four times a day (QID) | ORAL | 0 refills | Status: DC | PRN
Start: 2024-03-09 — End: 2024-05-26

## 2024-03-09 MED ORDER — VOQUEZNA 10 MG PO TABS: 1.0000 | ORAL_TABLET | Freq: Every day | ORAL | 5 refills | Status: DC

## 2024-03-09 NOTE — Progress Notes (Signed)
 Established Patient Office Visit  Subjective   Patient ID: [name], female    DOB: 10-Nov-1945  Age: 78 y.o. MRN: [id number]  Chief Complaint  Patient presents with   Hair/Scalp Problem    Complains of scalp itching    Fatigue    Complains of feeling weak    Cough    Complains of dry cough for about 10 days    HPI Discussed the use of AI scribe software for clinical note transcription with the patient, who gave verbal consent to proceed.  History of Present Illness [name] is a 78 year old female who presents with fatigue, congestion, and hair loss.  She has been experiencing fatigue for the last four to five weeks, which is progressively worsening. Alongside fatigue, she has a dry cough that has persisted for about a week and a half, and congestion with green discharge in her throat that has lasted slightly longer. No fever or chills are reported.  She has been dealing with hair loss for an extended period, with scalp itching and small bumps present for over six weeks. The bumps are sometimes dry and scaly, with a notable red bump at the back of her scalp. Hair thinning is particularly noticeable at the top of her head, while the hair at the back remains relatively unaffected.  She experiences bad breath despite using over-the-counter mouthwash and regularly brushing her tongue and the roof of her mouth. She has not visited the dentist recently but has an upcoming appointment.  She is currently taking Claritin  and Flonase , using the latter twice a week for four weeks. She also uses Phazyme for stomach issues but reports that pantoprazole  was ineffective. She experiences heartburn and reflux.  She reports a little burning during urination. No ear pain but reports hearing difficulties, possibly due to wax buildup. Reports sinus pressure and congestion.   Patient Active Problem List   Diagnosis Date Noted   Bilateral hearing loss 04/15/2023   Anxiety 11/19/2022    Pain of right heel 11/19/2022   Radiculopathy, lumbar region 05/31/2022   Primary hypertension 09/18/2021   Pain of left calf 09/18/2021   Depression with anxiety 06/21/2021   Diabetes mellitus (HCC) 01/17/2021   Type 2 diabetes mellitus with hyperglycemia, without long-term current use of insulin  (HCC) 01/17/2021   Acute pain of right shoulder 08/18/2020   Primary osteoarthritis of right knee 08/18/2020   Impacted cerumen of right ear 05/24/2020   Bilateral impacted cerumen 05/17/2020   Hyperlipidemia associated with type 2 diabetes mellitus (HCC) 03/10/2020   Cough 03/10/2020   Seasonal allergies 03/10/2020   Elevated rheumatoid factor 07/21/2018   Gastroenteritis 07/21/2018   Trigger middle finger of left hand 12/19/2017   Ganglion cyst of dorsum of right wrist 12/19/2017   Morton's neuroma of right foot 12/19/2017   Preventative health care 12/13/2017   Pain in both hands 12/13/2017   Hyperlipidemia LDL goal <100 12/13/2017   Abscess of upper gum 12/13/2017   Spinal stenosis of cervical region 09/25/2016   Cervical radiculopathy 09/06/2016   Dyspnea 02/18/2016   Chest pain 02/18/2016   HLD (hyperlipidemia) 02/18/2016   CAD in native artery 02/18/2016   RLS (restless legs syndrome) 02/18/2016   Pancreatitis 02/18/2016   SOB (shortness of breath) 02/18/2016   Uncontrolled type 2 diabetes mellitus with complication    Tobacco abuse    Osteoarthritis of right knee 07/05/2015   Smoking 07/02/2013   Acute upper respiratory infection 07/02/2013   Flu vaccine  need 07/02/2013   Multinodular goiter 04/16/2013   Bunion 07/07/2012   BACK PAIN, THORACIC REGION, RIGHT 05/24/2009   IRON DEFICIENCY 03/25/2009   GERD 03/25/2009   WEIGHT LOSS 03/25/2009   SINUSITIS- ACUTE-NOS 11/25/2008   Hyperlipidemia LDL goal <70 10/07/2008   ACUTE PANCREATITIS 09/07/2008   OVARIAN CYST 09/07/2008   KNEE PAIN, RIGHT 05/24/2008   CARPAL TUNNEL SYNDROME, RIGHT 05/12/2008   Acute pain of both  shoulders 04/02/2008   ROTATOR CUFF REPAIR, RIGHT, HX OF 03/29/2008   GOITER, MULTINODULAR 12/26/2007   Depression 07/07/2007   HIP PAIN, RIGHT 03/11/2007   RESTLESS LEG SYNDROME, HX OF 03/11/2007   Diabetes mellitus type II, uncontrolled (HCC) 03/07/2007   Essential hypertension 03/07/2007   Past Medical History:  Diagnosis Date   Blockage of coronary artery of heart (HCC)    Depression    Diabetes mellitus    Hyperlipidemia    Hypertension    Pancreatitis    Restless leg syndrome    Past Surgical History:  Procedure Laterality Date   CATARACT EXTRACTION, BILATERAL     CORONARY ANGIOPLASTY WITH STENT PLACEMENT     FOOT SURGERY Right    cyst removal   ROTATOR CUFF REPAIR     right   ROTATOR CUFF REPAIR Left    TUBAL LIGATION     Social History   Tobacco Use   Smoking status: Former    Current packs/day: 0.00    Types: Cigarettes    Quit date: 05/05/2020    Years since quitting: 3.8   Smokeless tobacco: Never   Tobacco comments:    quit in 2021  Vaping Use   Vaping status: Never Used  Substance Use Topics   Alcohol use: No   Drug use: No   Social History   Socioeconomic History   Marital status: Married    Spouse name: Not on file   Number of children: Not on file   Years of education: Not on file   Highest education level: Not on file  Occupational History   Not on file  Tobacco Use   Smoking status: Former    Current packs/day: 0.00    Types: Cigarettes    Quit date: 05/05/2020    Years since quitting: 3.8   Smokeless tobacco: Never   Tobacco comments:    quit in 2021  Vaping Use   Vaping status: Never Used  Substance and Sexual Activity   Alcohol use: No   Drug use: No   Sexual activity: Not Currently  Other Topics Concern   Not on file  Social History Narrative   Exercise: seldom   Caffeine use: seldom   Social Drivers of Corporate investment banker Strain: Low Risk  (10/29/2023)   Overall Financial Resource Strain (CARDIA)     Difficulty of Paying Living Expenses: Not very hard  Food Insecurity: No Food Insecurity (10/29/2023)   Hunger Vital Sign    Worried About Running Out of Food in the Last Year: Never true    Ran Out of Food in the Last Year: Never true  Transportation Needs: No Transportation Needs (10/29/2023)   PRAPARE - Administrator, Civil Service (Medical): No    Lack of Transportation (Non-Medical): No  Physical Activity: Inactive (10/29/2023)   Exercise Vital Sign    Days of Exercise per Week: 0 days    Minutes of Exercise per Session: 0 min  Stress: No Stress Concern Present (10/29/2023)   [employer] of Occupational Health -  Occupational Stress Questionnaire    Feeling of Stress : Not at all  Social Connections: Moderately Integrated (10/29/2023)   Social Connection and Isolation Panel [NHANES]    Frequency of Communication with Friends and Family: More than three times a week    Frequency of Social Gatherings with Friends and Family: More than three times a week    Attends Religious Services: 1 to 4 times per year    Active Member of [employer] or Organizations: No    Attends Banker Meetings: Never    Marital Status: Married  Catering manager Violence: Not At Risk (10/29/2023)   Humiliation, Afraid, Rape, and Kick questionnaire    Fear of Current or Ex-Partner: No    Emotionally Abused: No    Physically Abused: No    Sexually Abused: No   Family Status  Relation Name Status   Father  Deceased   Sister  Alive   Brother  Deceased at age 24   Sister  Alive   Sister  Alive       neck/spine problems    Brother  Deceased       truck accident    Mother  Alive   Sister  Alive       blood clots   Sister  Deceased   Brother  Alive       blood clots   Son  Alive   Son  Alive   Son  Alive   Daughter  Alive   Daughter  Alive       back problems   No partnership data on file   Family History  Problem Relation Age of Onset   Heart attack Father    Heart attack  Brother    Diabetes Sister    Cirrhosis Sister    Hypertension Sister    Cancer Mother    Hypertension Mother    Vision loss Mother    Cancer Sister    Sleep apnea Sister    Heart Problems Sister    Diabetes Sister    Gout Sister    Diabetes Son    Heart attack Son    Diabetes Daughter    Goiter Daughter    Diabetes Daughter    Allergies  Allergen Reactions   Metformin And Related Other (See Comments)    Sick on stomach, sweaty, throw up, pain   Levemir [Insulin  Detemir] Hives and Swelling   Clindamycin Nausea And Vomiting   Crestor  [Rosuvastatin  Calcium ] Palpitations      Review of Systems  Constitutional:  Positive for malaise/fatigue. Negative for fever.  HENT:  Positive for congestion and sinus pain.   Eyes:  Negative for blurred vision.  Respiratory:  Negative for cough and shortness of breath.   Cardiovascular:  Negative for chest pain, palpitations and leg swelling.  Gastrointestinal:  Negative for abdominal pain, blood in stool, nausea and vomiting.  Genitourinary:  Positive for dysuria. Negative for frequency.  Musculoskeletal:  Negative for back pain and falls.  Skin:  Negative for rash.       Hair falling out   Neurological:  Negative for dizziness, loss of consciousness and headaches.  Endo/Heme/Allergies:  Negative for environmental allergies.  Psychiatric/Behavioral:  Negative for depression. The patient is not nervous/anxious.       Objective:     BP (!) 178/53 (BP Location: Left Arm, Patient Position: Sitting, Cuff Size: Normal)   Pulse (!) 59   Temp 97.8 F (36.6 C) (Oral)   Resp 16  Ht 5\' 5"  (1.651 m)   Wt 182 lb (82.6 kg)   SpO2 99%   BMI 30.29 kg/m  BP Readings from Last 3 Encounters:  03/09/24 (!) 178/53  11/04/23 132/84  09/12/23 (!) 171/62   Wt Readings from Last 3 Encounters:  03/09/24 182 lb (82.6 kg)  11/04/23 185 lb (83.9 kg)  09/12/23 180 lb (81.6 kg)   SpO2 Readings from Last 3 Encounters:  03/09/24 99%  11/04/23 96%   09/12/23 99%      Physical Exam Vitals and nursing note reviewed.  Constitutional:      General: She is not in acute distress.    Appearance: Normal appearance. She is well-developed.  HENT:     Head: Normocephalic and atraumatic.     Right Ear: Tympanic membrane normal.     Left Ear: Tympanic membrane normal.     Nose: Congestion present.     Right Sinus: Maxillary sinus tenderness and frontal sinus tenderness present.     Left Sinus: Maxillary sinus tenderness and frontal sinus tenderness present.  Eyes:     General: No scleral icterus.       Right eye: No discharge.        Left eye: No discharge.  Cardiovascular:     Rate and Rhythm: Normal rate and regular rhythm.     Heart sounds: No murmur heard. Pulmonary:     Effort: Pulmonary effort is normal. No respiratory distress.     Breath sounds: Normal breath sounds.  Musculoskeletal:        General: Normal range of motion.     Cervical back: Normal range of motion and neck supple.     Right lower leg: No edema.     Left lower leg: No edema.  Skin:    General: Skin is warm and dry.  Neurological:     Mental Status: She is alert and oriented to person, place, and time.  Psychiatric:        Mood and Affect: Mood normal.        Behavior: Behavior normal.        Thought Content: Thought content normal.        Judgment: Judgment normal.      Results for orders placed or performed in visit on 03/09/24  POCT Urinalysis Dipstick (Automated)  Result Value Ref Range   Color, UA yellow    Clarity, UA cloudy    Glucose, UA Positive (A) Negative   Bilirubin, UA neg    Ketones, UA neg    Spec Grav, UA 1.010 1.010 - 1.025   Blood, UA neg    pH, UA 5.0 5.0 - 8.0   Protein, UA Positive (A) Negative   Urobilinogen, UA 0.2 0.2 or 1.0 E.U./dL   Nitrite, UA positive    Leukocytes, UA Negative Negative    Last CBC Lab Results  Component Value Date   WBC 8.1 07/12/2023   HGB 11.9 07/12/2023   HCT 36.4 07/12/2023   MCV  91.2 07/12/2023   MCH 29.8 07/12/2023   RDW 12.6 07/12/2023   PLT 202 07/12/2023   Last metabolic panel Lab Results  Component Value Date   GLUCOSE 152 (H) 07/12/2023   NA 137 07/12/2023   K 4.0 07/12/2023   CL 103 07/12/2023   CO2 25 07/12/2023   BUN 29 (H) 07/12/2023   CREATININE 1.17 (H) 07/12/2023   GFR 43.74 (L) 04/15/2023   CALCIUM  10.3 07/12/2023   PHOS 5.0 (H) 03/03/2009   PROT  6.3 07/12/2023   ALBUMIN 4.3 04/15/2023   BILITOT 0.5 07/12/2023   ALKPHOS 38 (L) 04/15/2023   AST 18 07/12/2023   ALT 18 07/12/2023   ANIONGAP 13 09/23/2020   Last lipids Lab Results  Component Value Date   CHOL 120 07/12/2023   HDL 36 (L) 07/12/2023   LDLCALC 60 07/12/2023   LDLDIRECT 53.0 04/24/2021   TRIG 163 (H) 07/12/2023   CHOLHDL 3.3 07/12/2023   Last hemoglobin A1c Lab Results  Component Value Date   HGBA1C 8.2 (A) 11/04/2023   Last thyroid  functions Lab Results  Component Value Date   TSH 0.59 07/12/2023   Last vitamin D No results found for: "25OHVITD2", "25OHVITD3", "VD25OH" Last vitamin B12 and Folate Lab Results  Component Value Date   VITAMINB12 913 (H) 07/16/2018   FOLATE 11.2 08/18/2009      The ASCVD Risk score (Arnett DK, et al., 2019) failed to calculate for the following reasons:   The valid total cholesterol range is 130 to 320 mg/dL    Assessment & Plan:   Problem List Items Addressed This Visit       Unprioritized   SINUSITIS- ACUTE-NOS   Relevant Medications   promethazine -dextromethorphan (PROMETHAZINE -DM) 6.25-15 MG/5ML syrup   amoxicillin -clavulanate (AUGMENTIN ) 875-125 MG tablet   GERD   Relevant Medications   Vonoprazan Fumarate (VOQUEZNA) 10 MG TABS   Cough   Relevant Medications   promethazine -dextromethorphan (PROMETHAZINE -DM) 6.25-15 MG/5ML syrup   Depression with anxiety   Relevant Orders   CBC with Differential/Platelet   Comprehensive metabolic panel with GFR   Vitamin B12   VITAMIN D 25 Hydroxy (Vit-D Deficiency,  Fractures)   TSH   Essential hypertension   Relevant Orders   CBC with Differential/Platelet   Comprehensive metabolic panel with GFR   Vitamin B12   VITAMIN D 25 Hydroxy (Vit-D Deficiency, Fractures)   TSH   Other Visit Diagnoses       Other fatigue    -  Primary   Relevant Orders   CBC with Differential/Platelet   Comprehensive metabolic panel with GFR   Vitamin B12   VITAMIN D 25 Hydroxy (Vit-D Deficiency, Fractures)   TSH   POCT Urinalysis Dipstick (Automated) (Completed)     Tinea capitis       Relevant Medications   Ciclopirox 1 % shampoo     Dysuria       Relevant Orders   Urine Culture     Assessment and Plan Assessment & Plan Fatigue   She has experienced fatigue for several months, potentially due to sinusitis, alopecia, and urinary issues. Order blood work to investigate causes.  Sinusitis   She has had sinus congestion and pressure for over a week, with a dry cough and halitosis likely due to drainage. Continue Flonase  as needed and prescribe an antibiotic. Advise using over-the-counter mouthwash and encourage a dental evaluation for halitosis.  Dry cough   The dry cough has persisted for about a week and a half, likely related to sinusitis. Prescribe cough syrup.  Halitosis   Halitosis is possibly related to sinusitis and drainage. Over-the-counter mouthwash has been ineffective. Advise maintaining oral hygiene and consider a dental evaluation for potential causes.  Alopecia with scalp itching   She has experienced scalp itching and hair loss for over six weeks, with dry, scaly bumps suggesting a dermatological issue. Ensure adequate protein intake. Prescribe medicated shampoo twice a week and consider a dermatologist referral if no improvement.  Urinary burning   Reports of urinary  burning may contribute to fatigue. Collect a urine sample for analysis.  Heartburn and reflux   She experiences heartburn and reflux. Previous treatment with pantoprazole  was  ineffective. She is interested in trying Voquezna, but insurance may require other treatments first due to cost. Prescribe Voquezna and advise her to inform if not covered by insurance.    No follow-ups on file.    Skylier Kretschmer R Lowne Chase, DO

## 2024-03-10 ENCOUNTER — Other Ambulatory Visit (HOSPITAL_COMMUNITY): Payer: Self-pay

## 2024-03-10 ENCOUNTER — Telehealth: Payer: Self-pay

## 2024-03-10 LAB — URINE CULTURE
MICRO NUMBER:: 16526566
SPECIMEN QUALITY:: ADEQUATE

## 2024-03-10 LAB — CBC WITH DIFFERENTIAL/PLATELET
Basophils Absolute: 0.1 10*3/uL (ref 0.0–0.1)
Basophils Relative: 1.1 % (ref 0.0–3.0)
Eosinophils Absolute: 0.1 10*3/uL (ref 0.0–0.7)
Eosinophils Relative: 0.8 % (ref 0.0–5.0)
HCT: 37.6 % (ref 36.0–46.0)
Hemoglobin: 12.4 g/dL (ref 12.0–15.0)
Lymphocytes Relative: 39.7 % (ref 12.0–46.0)
Lymphs Abs: 3.2 10*3/uL (ref 0.7–4.0)
MCHC: 32.9 g/dL (ref 30.0–36.0)
MCV: 88.4 fl (ref 78.0–100.0)
Monocytes Absolute: 0.7 10*3/uL (ref 0.1–1.0)
Monocytes Relative: 9.3 % (ref 3.0–12.0)
Neutro Abs: 3.9 10*3/uL (ref 1.4–7.7)
Neutrophils Relative %: 49.1 % (ref 43.0–77.0)
Platelets: 200 10*3/uL (ref 150.0–400.0)
RBC: 4.26 Mil/uL (ref 3.87–5.11)
RDW: 14.9 % (ref 11.5–15.5)
WBC: 7.9 10*3/uL (ref 4.0–10.5)

## 2024-03-10 LAB — COMPREHENSIVE METABOLIC PANEL WITH GFR
ALT: 19 U/L (ref 0–35)
AST: 21 U/L (ref 0–37)
Albumin: 4.3 g/dL (ref 3.5–5.2)
Alkaline Phosphatase: 37 U/L — ABNORMAL LOW (ref 39–117)
BUN: 30 mg/dL — ABNORMAL HIGH (ref 6–23)
CO2: 28 meq/L (ref 19–32)
Calcium: 10.5 mg/dL (ref 8.4–10.5)
Chloride: 103 meq/L (ref 96–112)
Creatinine, Ser: 1.11 mg/dL (ref 0.40–1.20)
GFR: 47.72 mL/min — ABNORMAL LOW (ref >60.00)
Glucose, Bld: 178 mg/dL — ABNORMAL HIGH (ref 70–99)
Potassium: 4.5 meq/L (ref 3.5–5.1)
Sodium: 138 meq/L (ref 135–145)
Total Bilirubin: 0.6 mg/dL (ref 0.2–1.2)
Total Protein: 7 g/dL (ref 6.0–8.3)

## 2024-03-10 LAB — TSH: TSH: 0.91 u[IU]/mL (ref 0.35–5.50)

## 2024-03-10 LAB — VITAMIN B12: Vitamin B-12: >1500 pg/mL — ABNORMAL HIGH (ref 211–911)

## 2024-03-10 LAB — VITAMIN D 25 HYDROXY (VIT D DEFICIENCY, FRACTURES): VITD: 37.61 ng/mL (ref 30.00–100.00)

## 2024-03-10 NOTE — Telephone Encounter (Signed)
 Pharmacy Patient Advocate Encounter   Received notification from Pt Calls Messages that prior authorization for Voquezna 10mg  tabs is required/requested.   Insurance verification completed.   The patient is insured through [employer] .   Per test claim: PA required; PA submitted to above mentioned insurance via CoverMyMeds Key/confirmation #/EOC [id number] Status is pending

## 2024-03-10 NOTE — Telephone Encounter (Signed)
 Copied from CRM [id number]. Topic: Clinical - Prescription Issue >> Mar 10, 2024 12:50 PM Aisha D wrote: Reason for CRM: Pt stated that the pharmacy stated the insurance is requesting a PA for the Vonoprazan Fumarate (VOQUEZNA) 10 MG TABS and the Ciclopirox 1 % shampoo. Pt would like for the PA to be sent today so she can start the medication. Pt would also like to receive a call back with an update on the request.

## 2024-03-10 NOTE — Telephone Encounter (Signed)
 Pharmacy Patient Advocate Encounter   Received notification from Pt Calls Messages that prior authorization for Ciclopirox 1% shampoo is required/requested.   Insurance verification completed.   The patient is insured through [employer] .   Per test claim: PA required; PA submitted to above mentioned insurance via CoverMyMeds Key/confirmation #/EOC [facility] Status is pending

## 2024-03-11 ENCOUNTER — Other Ambulatory Visit: Payer: Self-pay

## 2024-03-11 ENCOUNTER — Other Ambulatory Visit: Payer: Self-pay | Admitting: Family Medicine

## 2024-03-11 ENCOUNTER — Ambulatory Visit: Payer: Self-pay | Admitting: Family Medicine

## 2024-03-11 DIAGNOSIS — K219 Gastro-esophageal reflux disease without esophagitis: Secondary | ICD-10-CM

## 2024-03-11 MED ORDER — ESOMEPRAZOLE MAGNESIUM 40 MG PO CPDR: 40.0000 mg | DELAYED_RELEASE_CAPSULE | Freq: Every day | ORAL | 3 refills | Status: DC

## 2024-03-11 MED ORDER — VITAMIN D (ERGOCALCIFEROL) 1.25 MG (50000 UNIT) PO CAPS: 50000.0000 [IU] | ORAL_CAPSULE | ORAL | 1 refills | Status: DC

## 2024-03-11 NOTE — Telephone Encounter (Signed)
 Pt made aware

## 2024-03-11 NOTE — Telephone Encounter (Signed)
 Pharmacy Patient Advocate Encounter  Received notification from [employer] that Prior Authorization for Ciclopirox 1% shampoo has been DENIED.  See denial reason below. No denial letter attached in CMM. Will attach denial letter to Media tab once received.   PA #/Case ID/Reference #: [id number]

## 2024-03-11 NOTE — Telephone Encounter (Signed)
 Pharmacy Patient Advocate Encounter  Received notification from [employer] that Prior Authorization for Voquezna 10mg  tabs has been DENIED.  See denial reason below. No denial letter attached in CMM. Will attach denial letter to Media tab once received.   PA #/Case ID/Reference #: [id number]

## 2024-03-31 ENCOUNTER — Ambulatory Visit: Admitting: Internal Medicine

## 2024-03-31 ENCOUNTER — Encounter: Payer: Self-pay | Admitting: Internal Medicine

## 2024-03-31 VITALS — BP 118/74 | HR 70 | Ht 65.0 in | Wt 184.0 lb

## 2024-03-31 DIAGNOSIS — Z7984 Long term (current) use of oral hypoglycemic drugs: Secondary | ICD-10-CM

## 2024-03-31 DIAGNOSIS — Z794 Long term (current) use of insulin: Secondary | ICD-10-CM

## 2024-03-31 DIAGNOSIS — E042 Nontoxic multinodular goiter: Secondary | ICD-10-CM

## 2024-03-31 DIAGNOSIS — E1165 Type 2 diabetes mellitus with hyperglycemia: Secondary | ICD-10-CM | POA: Diagnosis not present

## 2024-03-31 DIAGNOSIS — R682 Dry mouth, unspecified: Secondary | ICD-10-CM | POA: Diagnosis not present

## 2024-03-31 LAB — POCT GLYCOSYLATED HEMOGLOBIN (HGB A1C): Hemoglobin A1C: 8.2 % — AB (ref 4.0–5.6)

## 2024-03-31 MED ORDER — OMNIPOD 5 G7 PODS (GEN 5) MISC: 1.0000 | 3 refills | Status: DC

## 2024-03-31 MED ORDER — OMNIPOD 5 G7 INTRO (GEN 5) KIT: 1.0000 | PACK | 0 refills | Status: AC

## 2024-03-31 MED ORDER — DEXCOM G7 SENSOR MISC
1.0000 | 3 refills | Status: AC
Start: 2024-03-31 — End: ?

## 2024-03-31 MED ORDER — OMNIPOD 5 G7 INTRO (GEN 5) KIT: 1.0000 | PACK | 0 refills | Status: DC

## 2024-03-31 MED ORDER — OMNIPOD 5 G7 PODS (GEN 5) MISC: 1.0000 | 3 refills | Status: AC

## 2024-03-31 NOTE — Progress Notes (Signed)
 Name: [name]  Age/ Sex: 78 y.o., female   MRN/ DOB: [id number], [birth year]     PCP: Antonio Cyndee Jamee JONELLE, DO   Reason for Endocrinology Evaluation: Type 2 Diabetes Mellitus  Initial Endocrine Consultative Visit: 01/17/2021    PATIENT IDENTIFIER: [name] is a 78 y.o. female with a past medical history of HTN, T2Dm, CAD , Osteoporosis and Hx of pancreatitis. The patient has followed with Endocrinology clinic since 4 f/09/2021 or consultative assistance with management of her diabetes.  DIABETIC HISTORY:  [name] was diagnosed with DM in 1998, has intolerance to Metformin due to Gi side effects. Her hemoglobin A1c has ranged from 9.3% in 2015, peaking at 10.1% in 2020.  She has history of Pancreatitis that she believes is caused Metformin   On her initial visit to our clinic she had an A1c of 9.4%  , she was on Jardiance ,Glimepiride  , Januvia and insulin  mix . We stopped Glimepiride  as she was not taking it, we stopped Januvia due to hx of pancreatitis. We continued jardiance  and adjusted insulin  mix     Switched insulin  Mix to Basal/prandial insulin  05/2021   SUBJECTIVE:   During the last visit (11/04/2023): A1c 8.9%    Today (03/31/2024): [name] is here for a follow up on diabetes management. She checks her blood sugars 2-3 times daily. The patient has not had hypoglycemic episodes since the last clinic visit.     She stopped jardiance  2 weeks ago due to thrush , she is complaining of dry mouth Denies nausea and vomiting No constipation or diarrhea    HOME DIABETES REGIMEN:   Pioglitazone  30 mg daily Tresiba  44 units ONCE daily  Novolog  16 units with brunch and 18 units with supper Correction factor: NovoLog  (BG -130/25)     Statin: yes ACE-I/ARB: yes   CONTINUOUS GLUCOSE MONITORING RECORD INTERPRETATION    Dates of Recording: 6/11-6/24/2025  Sensor description: dexcom  Results statistics:   CGM use % of time 82  Average and SD  221/76  Time in range 31  % Time Above 180 37  % Time above 250 31  % Time Below target 1    Glycemic patterns summary: BGs are mostly elevated overnight and fluctuate during the day  Hyperglycemic episodes postprandial  Hypoglycemic episodes occurred where  Overnight periods: Mostly high     DIABETIC COMPLICATIONS: Microvascular complications:  Neuropathy  Denies: CKD, retinopathy, neuropathy Last Eye Exam: Completed 01/2022  Macrovascular complications:  CAD ( S/P stent placement)  Denies: CVA, PVD   HISTORY:  Past Medical History:  Past Medical History:  Diagnosis Date   Blockage of coronary artery of heart (HCC)    Depression    Diabetes mellitus    Hyperlipidemia    Hypertension    Pancreatitis    Restless leg syndrome    Past Surgical History:  Past Surgical History:  Procedure Laterality Date   CATARACT EXTRACTION, BILATERAL     CORONARY ANGIOPLASTY WITH STENT PLACEMENT     FOOT SURGERY Right    cyst removal   ROTATOR CUFF REPAIR     right   ROTATOR CUFF REPAIR Left    TUBAL LIGATION     Social History:  reports that she quit smoking about 3 years ago. Her smoking use included cigarettes. She has never used smokeless tobacco. She reports that she does not drink alcohol and does not use drugs. Family History:  Family History  Problem Relation Age of Onset  Heart attack Father    Heart attack Brother    Diabetes Sister    Cirrhosis Sister    Hypertension Sister    Cancer Mother    Hypertension Mother    Vision loss Mother    Cancer Sister    Sleep apnea Sister    Heart Problems Sister    Diabetes Sister    Gout Sister    Diabetes Son    Heart attack Son    Diabetes Daughter    Goiter Daughter    Diabetes Daughter      HOME MEDICATIONS: Allergies as of 03/31/2024       Reactions   Metformin And Related Other (See Comments)   Sick on stomach, sweaty, throw up, pain   Levemir [insulin  Detemir] Hives, Swelling   Clindamycin Nausea  And Vomiting   Crestor  [rosuvastatin  Calcium ] Palpitations        Medication List        Accurate as of March 31, 2024 12:24 PM. If you have any questions, ask your nurse or doctor.          alendronate  70 MG tablet Commonly known as: FOSAMAX  TAKE ONE TABLET BY MOUTH ONCE WEEKLY ON WEDNESDAY   ALPRAZolam  0.25 MG tablet Commonly known as: XANAX  Take 1 tablet by mouth three times a day if needed   amLODipine  10 MG tablet Commonly known as: NORVASC  TAKE ONE TABLET BY MOUTH ONCE DAILY   amoxicillin -clavulanate 875-125 MG tablet Commonly known as: AUGMENTIN  Take 1 tablet by mouth 2 (two) times daily.   aspirin  EC 81 MG tablet Take 81 mg by mouth daily.   atorvastatin  80 MG tablet Commonly known as: LIPITOR Take 1 tablet (80 mg total) by mouth every evening.   B-D SINGLE USE SWABS REGULAR Pads USE AS DIRECTED   buPROPion  300 MG 24 hr tablet Commonly known as: Wellbutrin  XL Take 1 tablet (300 mg total) by mouth daily.   Ciclopirox  1 % shampoo Massage into scalp 2x a week for 4 weeks   Comfort EZ Pen Needles 32G X 4 MM Misc Generic drug: Insulin  Pen Needle 1 Device by Other route in the morning, at noon, in the evening, and at bedtime. as directed   Dexcom G6 Receiver Devi Use to monitor glucose   Dexcom G6 Transmitter Misc REPLACE TRANSMITTER EVERY 90 DAYS   empagliflozin  25 MG Tabs tablet Commonly known as: Jardiance  Take 1 tablet (25 mg total) by mouth daily before breakfast.   esomeprazole  40 MG capsule Commonly known as: NexIUM  Take 1 capsule (40 mg total) by mouth daily.   fenofibrate  160 MG tablet TAKE ONE TABLET BY MOUTH EVERY EVENING   ferrous sulfate 325 (65 FE) MG tablet Take 325 mg by mouth daily with breakfast.   fexofenadine 180 MG tablet Commonly known as: ALLEGRA Take 180 mg by mouth daily as needed for allergies or rhinitis.   Fish Oil 1000 MG Caps Take 1 capsule by mouth in the morning and at bedtime.   FLUoxetine  40 MG  capsule Commonly known as: PROZAC  Take 1 capsule (40 mg total) by mouth daily.   fluticasone  50 MCG/ACT nasal spray Commonly known as: FLONASE  INSTILL 2 SPRAYS IN EACH NOSTRIL DAILY   gabapentin  300 MG capsule Commonly known as: Neurontin  Take 1 capsule (300 mg total) by mouth 3 (three) times daily.   methocarbamol  500 MG tablet Commonly known as: ROBAXIN  Take 1 tablet (500 mg total) by mouth 2 (two) times daily.   metoprolol  succinate 50 MG  24 hr tablet Commonly known as: TOPROL -XL TAKE ONE TABLET BY MOUTH ONCE DAILY WITH FOOD   multivitamin with minerals Tabs tablet Take 1 tablet by mouth daily.   NovoLOG  FlexPen 100 UNIT/ML FlexPen Generic drug: insulin  aspart Max daily 70 units   pioglitazone  30 MG tablet Commonly known as: ACTOS  Take 1 tablet (30 mg total) by mouth daily.   promethazine -dextromethorphan 6.25-15 MG/5ML syrup Commonly known as: PROMETHAZINE -DM Take 5 mLs by mouth 4 (four) times daily as needed.   THERATEARS OP Place 1 drop into both eyes daily as needed (dry eyes).   Tresiba  FlexTouch 100 UNIT/ML FlexTouch Pen Generic drug: insulin  degludec Inject 44 Units into the skin daily.   True Metrix Blood Glucose Test test strip Generic drug: glucose blood USE AS DIRECTED 4 TIMES A DAY   valsartan  320 MG tablet Commonly known as: DIOVAN  TAKE ONE TABLET BY MOUTH EVERY MORNING   Vitamin D  (Ergocalciferol ) 1.25 MG (50000 UNIT) Caps capsule Commonly known as: DRISDOL  Take 1 capsule (50,000 Units total) by mouth every 7 (seven) days.   vitamin E  180 MG (400 UNITS) capsule Take 400 Units by mouth daily.         OBJECTIVE:   Vital Signs: BP 118/74 (BP Location: Left Arm, Patient Position: Sitting, Cuff Size: Normal)   Pulse 70   Ht 5' 5 (1.651 m)   Wt 184 lb (83.5 kg)   SpO2 97%   BMI 30.62 kg/m   Wt Readings from Last 3 Encounters:  03/31/24 184 lb (83.5 kg)  03/09/24 182 lb (82.6 kg)  11/04/23 185 lb (83.9 kg)     Exam: General: Pt  appears well and is in NAD  Bilateral thyroid  nodules have been appreciated  Lungs: Clear with good BS bilat   Heart: RRR   Extremities: No pretibial edema.   Neuro: MS is good with appropriate affect, pt is alert and Ox3    DM foot exam: 11/04/2023  The skin of the feet is intact without sores or ulcerations. The pedal pulses are 2+ on right and 2+ on left. The sensation is intact to a screening 5.07, 10 gram monofilament bilaterally     DATA REVIEWED:  Lab Results  Component Value Date   HGBA1C 8.2 (A) 03/31/2024   HGBA1C 8.2 (A) 11/04/2023   HGBA1C 9.3 (H) 07/12/2023    Latest Reference Range & Units 03/09/24 15:57  Sodium 135 - 145 mEq/L 138  Potassium 3.5 - 5.1 mEq/L 4.5  Chloride 96 - 112 mEq/L 103  CO2 19 - 32 mEq/L 28  Glucose 70 - 99 mg/dL 821 (H)  BUN 6 - 23 mg/dL 30 (H)  Creatinine 9.59 - 1.20 mg/dL 8.88  Calcium  8.4 - 10.5 mg/dL 89.4  Alkaline Phosphatase 39 - 117 U/L 37 (L)  Albumin 3.5 - 5.2 g/dL 4.3  AST 0 - 37 U/L 21  ALT 0 - 35 U/L 19  Total Protein 6.0 - 8.3 g/dL 7.0  Total Bilirubin 0.2 - 1.2 mg/dL 0.6  GFR >39.99 mL/min 47.72 (L)     ASSESSMENT / PLAN / RECOMMENDATIONS:   1) Type 2 Diabetes Mellitus, poorly controlled , With CKD III, macrovascular  complications - Most recent A1c of 8.2 %. Goal A1c < 7.0 %.    -A1c continues to be above goal - GLP-1 agonists and DPP-4 inhibitors are contra-indicated due to hx of pancreatitis  -Intolerant to metformin - She has stopped Jardiance  due to thrush, I did explain to the patient that Jardiance  causes  genital infections and UTIs.  We have opted to hold the Jardiance  at this time due to dry mouth, once her dry mouth has improved she may restart the Jardiance  - Patient will be provided with an NovoLog  dose for snack, will increase NovoLog  with meals as well - I have recommended considering insulin  pump technology - A prescription for OmniPod will be sent to a local pharmacy as well as DME supplier to  check coverage.  Patient to contact our office should she receive the OmniPod, so I could refer her to our CDE for training  MEDICATIONS: - Continue pioglitazone  30 mg daily - Hold Jardiance  25 mg, 1 tablet in the morning  -Continue Tresiba  44 units ONCE daily  -Continue  Novolog  18 units with brunch and continue 20 units with supper -Take 2 units of NovoLog  with starchy snack -Continue correction factor: NovoLog  (BG -130/25)     EDUCATION / INSTRUCTIONS: BG monitoring instructions: Patient is instructed to check her blood sugars 3 times a day, before meals. Call [facility] if: BG persistently < 70  I reviewed the Rule of 15 for the treatment of hypoglycemia in detail with the patient. Literature supplied.   2) Diabetic complications:  Eye: Does not have known diabetic retinopathy.  Neuro/ Feet: Does not have known diabetic peripheral neuropathy .  Renal: Patient does not have known baseline CKD. She   is  on an ACEI/ARB at present.    3) MNG:  - Patient has been noted with thyromegaly on exam today - No local neck symptoms - TFTs normal - Will proceed with thyroid  ultrasound   4) Dry Mouth :  - Patient to use Biotene mouthwash - She was also encouraged to use sugar-free lozenges or chewing gum to stimulate saliva glands - She may restart Jardiance  should dry mouth improve   F/U in 4 months     Signed electronically by: Stefano Redgie Butts, MD  [facility] End[facility]  [facility] [address] [place], [place] [address] Phone: [phone] FAX: [phone]   CC: Antonio Cyndee Jamee JONELLE, DO [address] [place] [address] Phone: [phone]  Fax: [phone]  Return to Endocrinology clinic as below: No future appointments.

## 2024-03-31 NOTE — Patient Instructions (Addendum)
-   Continue  Actos  ( Pioglitazone  )30 mg daily  - Tresiba  44 units ONCE daily  - Change Novolog  18 units with Brunch , 20 units with Supper  - Take Novolog  2 units with a starchy snack such as foods, chips or cheese its -Novolog  correctional insulin : ADD extra units on insulin  to your meal-time Novolog  dose if your blood sugars are higher than 155. Use the scale below to help guide you:   Blood sugar before meal Number of units to inject  Less than 155 0 unit  156 -  180 1 units  181 -  205 2 units  206 -  230 3 units  231 -  255 4 units  256 -  280 5 units  281 -  305 6 units  306 -  330 7 units  331 -  355 8 units     Biotin mouth wash , sugar-free chewing gum or lozenges    HOW TO TREAT LOW BLOOD SUGARS (Blood sugar LESS THAN 70 MG/DL) Please follow the RULE OF 15 for the treatment of hypoglycemia treatment (when your (blood sugars are less than 70 mg/dL)   STEP 1: Take 15 grams of carbohydrates when your blood sugar is low, which includes:  3-4 GLUCOSE TABS  OR 3-4 OZ OF JUICE OR REGULAR SODA OR ONE TUBE OF GLUCOSE GEL    STEP 2: RECHECK blood sugar in 15 MINUTES STEP 3: If your blood sugar is still low at the 15 minute recheck --> then, go back to STEP 1 and treat AGAIN with another 15 grams of carbohydrates

## 2024-04-07 ENCOUNTER — Telehealth: Payer: Self-pay | Admitting: Nutrition

## 2024-04-07 NOTE — Telephone Encounter (Signed)
 Message left on my machine to call her back.  Tried called but not able to leave message.  It is full.

## 2024-04-21 ENCOUNTER — Ambulatory Visit: Admitting: Nutrition

## 2024-04-28 ENCOUNTER — Encounter: Attending: Internal Medicine | Admitting: Nutrition

## 2024-04-28 DIAGNOSIS — E1165 Type 2 diabetes mellitus with hyperglycemia: Secondary | ICD-10-CM | POA: Insufficient documentation

## 2024-04-28 NOTE — Patient Instructions (Signed)
 Give a bolus for all meals and snacks eaten-16u for first meal and 18u for 2nd meal Put blood sugar readings into the bolus calculation Change pod every 3 days Call OmniPod help line if questions about how to use the pump.   Call Dexcom help line if questions or problems with the sensors.

## 2024-04-28 NOTE — Progress Notes (Signed)
 Patient was trained on the use of the omnipod 5 insulin  pump. She wanted to use the app on her phone, but was not able to remember her phone ID.  She was also not able to use the phone to download the dexcom app, so the readings continue to go to the reader and she was shown how to enter those readings into the bolus calculations. She will return in one week with her phone id and we can the hopefully use the app and the dexcom G7 in place of the G6 sensors she is now using.Settings were put into the pdm by myself and explained to the patient:  basal rate: 1.35u/hr, target: 120 with corrections over 145, I/C ratio: 1 with written instructions to give 16u for first meal and 18u for second meal (she was giving 18u for first and 20u for second meal).  ISf: 25, timing 4 hrs., max basal 2.8u/hr, max bolus 28 She was shown how to bolus and redemonstrated this X4 correctly.  WE discussed the importance of putting in the blood sugar readings for all meals and snacks.  She reported good understanding of this. The Dexcom G6 trasmitter was linked to the PDM, and her PDM was linked to glooko:  user name: [name], PW: [email or web address]!  She filled a pod using the Novolog  pens.  And attached it to her left arm.  Pump was started at 10AM.  Patient appeared overwhelmed and agreed to come back next week for a review.  Alerts and alarms were reviewed as well as how to fill a pod and give a bolus.  She reported good understanding of this with no final questions.  An appointment was made for next Monday.  I will cal her tomorrow to see how she is doing.

## 2024-04-29 ENCOUNTER — Telehealth: Payer: Self-pay | Admitting: Nutrition

## 2024-04-29 MED ORDER — CONTOUR NEXT TEST VI STRP: ORAL_STRIP | 12 refills | Status: AC

## 2024-04-29 NOTE — Telephone Encounter (Signed)
 Test strips already sent

## 2024-04-29 NOTE — Telephone Encounter (Signed)
 No she is on the G6.  She has about 5 more G6 sensors she must use before her insurance will pay for new ones.

## 2024-04-29 NOTE — Telephone Encounter (Signed)
 Patient reported that she has the password to her phone and wants the dexcom G6 and OmniPod5 app put on her phone.,  The dexcom app was put on her phone as well as Clarity.  THis was linked to [email or web address].  WE installed her Omnipod 5 app on her phone, however,it requires a pass code for her phone which she is refusing to have.  So she will continue to use the PDM for now.   Her sensor ran out last night at 9PM.  I helped her change her sensor both on her new phone app, and her pdm.  She does not have a meter at home.  We discussed the importance of this and was given a Contour next EZ and shown how to use this.  Her blood sugar was 94.  There is a 2 hour warm up needed for this sensor.   We reviewed again how to bolus for meals and snacks, using 16g for first meal and 18g for 2nd meal.   She had no final questions

## 2024-05-05 ENCOUNTER — Ambulatory Visit: Admitting: Nutrition

## 2024-05-05 ENCOUNTER — Other Ambulatory Visit: Payer: Self-pay | Admitting: Family Medicine

## 2024-05-05 DIAGNOSIS — E785 Hyperlipidemia, unspecified: Secondary | ICD-10-CM

## 2024-05-05 DIAGNOSIS — I10 Essential (primary) hypertension: Secondary | ICD-10-CM

## 2024-05-05 MED ORDER — METOPROLOL SUCCINATE ER 50 MG PO TB24: ORAL_TABLET | ORAL | 3 refills | Status: AC

## 2024-05-05 MED ORDER — AMLODIPINE BESYLATE 10 MG PO TABS: 10.0000 mg | ORAL_TABLET | Freq: Every day | ORAL | 3 refills | Status: AC

## 2024-05-05 MED ORDER — FENOFIBRATE 160 MG PO TABS: 160.0000 mg | ORAL_TABLET | Freq: Every evening | ORAL | 3 refills | Status: AC

## 2024-05-05 MED ORDER — ALENDRONATE SODIUM 70 MG PO TABS: ORAL_TABLET | ORAL | 3 refills | Status: AC

## 2024-05-05 MED ORDER — VALSARTAN 320 MG PO TABS: 320.0000 mg | ORAL_TABLET | Freq: Every morning | ORAL | 3 refills | Status: AC

## 2024-05-05 NOTE — Telephone Encounter (Signed)
 Copied from CRM [id number]. Topic: Clinical - Medication Refill >> May 05, 2024 11:06 AM Franky GRADE wrote: Medication: alendronate  (FOSAMAX ) 70 MG tablet [[id number]],metoprolol  succinate (TOPROL -XL) 50 MG 24 hr tablet [[id number]],valsartan  (DIOVAN ) 320 MG tablet [[id number]],amLODipine  (NORVASC ) 10 MG tablet [[id number]],fenofibrate  160 MG tablet [[id number]]  Has the patient contacted their pharmacy? Yes, pharmacy is calling to place the refill request.  (Agent: If no, request that the patient contact the pharmacy for the refill. If patient does not wish to contact the pharmacy document the reason why and proceed with request.) (Agent: If yes, when and what did the pharmacy advise?)  This is the patient's preferred pharmacy:  [id number] [place], [place] - [address] AT [facility] & [address] [place] [address] Phone: [phone] Fax: [phone]   Is this the correct pharmacy for this prescription? Yes If no, delete pharmacy and type the correct one.   Has the prescription been filled recently? No  Is the patient out of the medication? Yes  Has the patient been seen for an appointment in the last year OR does the patient have an upcoming appointment? Yes  Can we respond through MyChart? Yes  Agent: Please be advised that Rx refills may take up to 3 business days. We ask that you follow-up with your pharmacy.

## 2024-05-06 ENCOUNTER — Ambulatory Visit: Payer: Self-pay | Admitting: *Deleted

## 2024-05-06 NOTE — Telephone Encounter (Signed)
 FYI Only or Action Required?: FYI only for provider.  Patient was last seen in primary care on 03/09/2024 by Antonio Meth, Jamee SAUNDERS, DO.  Called Nurse Triage reporting Bloated.  Symptoms began several days ago.  Interventions attempted: Nothing.  Symptoms are: gradually worsening.  Triage Disposition: See HCP Within 4 Hours (Or PCP Triage)  Patient/caregiver understands and will follow disposition?: yes   Reason for Disposition  [1] MODERATE-SEVERE SWELLING of abdomen (e.g., looks very distended or swollen) AND [2] NEW-onset or much worse  Answer Assessment - Initial Assessment Questions 1. SYMPTOM: What's the main symptom you're concerned about? (e.g., abdomen bloating, swelling)     Abdominal swelling/bloating 2. ONSET: When did bloating  start?     Last 2 days noticed severe bloating 3. SEVERITY: How bad is the bloating or swelling?     Clothes are tight 4. ABDOMEN PAIN:  Is there any abdomen pain? If Yes, ask: How bad is the pain?  (e.g., Scale 0-10; or none, mild, moderate, or severe)     No pain- more pressure 5. RELIEVING AND AGGRAVATING FACTORS: What makes it better or worse? (e.g., certain foods, lactose, medicines)     no 6. GI HISTORY: Do you have any history of stomach or intestine problems? (e.g., bowel obstruction, cancer, irritable bowel)      Hx bacterial infection 7. CAUSE: What do you think is causing the bloating?      Unsure- diagnosed with fibroids 8. OTHER SYMPTOMS: Do you have any other symptoms? (e.g., belching, blood in stool, breathing difficulty, constipation, diarrhea, fever, passing gas, vomiting, weight loss, white of eyes have turned yellow)     Increased gas at times  Protocols used: Abdomen Bloating and Swelling-A-AH   Copied from CRM #[id number]. Topic: Clinical - Red Word Triage >> May 06, 2024  4:05 PM Jayma L wrote: Red Word that prompted transfer to Nurse Triage: patient called and stated she is having issues with stomach  swelling said it keeps getting bigger and bigger and she is not sure why this is happening.

## 2024-05-07 ENCOUNTER — Ambulatory Visit (INDEPENDENT_AMBULATORY_CARE_PROVIDER_SITE_OTHER): Admitting: Family

## 2024-05-07 ENCOUNTER — Encounter: Payer: Self-pay | Admitting: Family

## 2024-05-07 VITALS — BP 126/62 | HR 66 | Temp 97.9°F | Resp 16 | Ht 65.0 in | Wt 186.2 lb

## 2024-05-07 DIAGNOSIS — R102 Pelvic and perineal pain: Secondary | ICD-10-CM | POA: Diagnosis not present

## 2024-05-07 DIAGNOSIS — R109 Unspecified abdominal pain: Secondary | ICD-10-CM

## 2024-05-07 NOTE — Progress Notes (Signed)
 [name] is a 78 y.o. female with the following history as recorded in Epic[employer]:  Patient Active Problem List   Diagnosis Date Noted   Bilateral hearing loss 04/15/2023   Anxiety 11/19/2022   Pain of right heel 11/19/2022   Radiculopathy, lumbar region 05/31/2022   Primary hypertension 09/18/2021   Pain of left calf 09/18/2021   Depression with anxiety 06/21/2021   Diabetes mellitus (HCC) 01/17/2021   Type 2 diabetes mellitus with hyperglycemia, without long-term current use of insulin  (HCC) 01/17/2021   Acute pain of right shoulder 08/18/2020   Primary osteoarthritis of right knee 08/18/2020   Impacted cerumen of right ear 05/24/2020   Bilateral impacted cerumen 05/17/2020   Hyperlipidemia associated with type 2 diabetes mellitus (HCC) 03/10/2020   Cough 03/10/2020   Seasonal allergies 03/10/2020   Elevated rheumatoid factor 07/21/2018   Gastroenteritis 07/21/2018   Trigger middle finger of left hand 12/19/2017   Ganglion cyst of dorsum of right wrist 12/19/2017   Morton's neuroma of right foot 12/19/2017   Preventative health care 12/13/2017   Pain in both hands 12/13/2017   Hyperlipidemia LDL goal <100 12/13/2017   Abscess of upper gum 12/13/2017   Spinal stenosis of cervical region 09/25/2016   Cervical radiculopathy 09/06/2016   Dyspnea 02/18/2016   Chest pain 02/18/2016   HLD (hyperlipidemia) 02/18/2016   CAD in native artery 02/18/2016   RLS (restless legs syndrome) 02/18/2016   Pancreatitis 02/18/2016   SOB (shortness of breath) 02/18/2016   Uncontrolled type 2 diabetes mellitus with complication    Tobacco abuse    Osteoarthritis of right knee 07/05/2015   Smoking 07/02/2013   Acute upper respiratory infection 07/02/2013   Flu vaccine need 07/02/2013   Multinodular goiter 04/16/2013   Bunion 07/07/2012   BACK PAIN, THORACIC REGION, RIGHT 05/24/2009   IRON DEFICIENCY 03/25/2009   GERD 03/25/2009   WEIGHT LOSS 03/25/2009   SINUSITIS- ACUTE-NOS  11/25/2008   Hyperlipidemia LDL goal <70 10/07/2008   ACUTE PANCREATITIS 09/07/2008   OVARIAN CYST 09/07/2008   KNEE PAIN, RIGHT 05/24/2008   CARPAL TUNNEL SYNDROME, RIGHT 05/12/2008   Acute pain of both shoulders 04/02/2008   ROTATOR CUFF REPAIR, RIGHT, HX OF 03/29/2008   GOITER, MULTINODULAR 12/26/2007   Depression 07/07/2007   HIP PAIN, RIGHT 03/11/2007   RESTLESS LEG SYNDROME, HX OF 03/11/2007   Diabetes mellitus type II, uncontrolled (HCC) 03/07/2007   Essential hypertension 03/07/2007    Current Outpatient Medications  Medication Sig Dispense Refill   Alcohol Swabs (B-D SINGLE USE SWABS REGULAR) PADS USE AS DIRECTED 400 each 1   alendronate  (FOSAMAX ) 70 MG tablet TAKE ONE TABLET BY MOUTH ONCE WEEKLY ON WEDNESDAY 12 tablet 3   ALPRAZolam  (XANAX ) 0.25 MG tablet Take 1 tablet by mouth three times a day if needed 60 tablet 1   amLODipine  (NORVASC ) 10 MG tablet Take 1 tablet (10 mg total) by mouth daily. 90 tablet 3   aspirin  EC 81 MG tablet Take 81 mg by mouth daily.     atorvastatin  (LIPITOR) 80 MG tablet Take 1 tablet (80 mg total) by mouth every evening. 90 tablet 1   buPROPion  (WELLBUTRIN  XL) 300 MG 24 hr tablet Take 1 tablet (300 mg total) by mouth daily. 90 tablet 3   Carboxymethylcellulose Sodium (THERATEARS OP) Place 1 drop into both eyes daily as needed (dry eyes).     Ciclopirox  1 % shampoo Massage into scalp 2x a week for 4 weeks 120 mL 0   Continuous Glucose Receiver (  DEXCOM G6 RECEIVER) DEVI Use to monitor glucose 1 each 0   Continuous Glucose Sensor (DEXCOM G7 SENSOR) MISC 1 Device by Does not apply route as directed. Every 10 days 9 each 3   Continuous Glucose Transmitter (DEXCOM G6 TRANSMITTER) MISC REPLACE TRANSMITTER EVERY 90 DAYS 1 each 3   esomeprazole  (NEXIUM ) 40 MG capsule Take 1 capsule (40 mg total) by mouth daily. 90 capsule 3   fenofibrate  160 MG tablet Take 1 tablet (160 mg total) by mouth every evening. 90 tablet 3   ferrous sulfate 325 (65 FE) MG tablet  Take 325 mg by mouth daily with breakfast.      fexofenadine (ALLEGRA) 180 MG tablet Take 180 mg by mouth daily as needed for allergies or rhinitis.     FLUoxetine  (PROZAC ) 40 MG capsule Take 1 capsule (40 mg total) by mouth daily. 90 capsule 3   fluticasone  (FLONASE ) 50 MCG/ACT nasal spray INSTILL 2 SPRAYS IN EACH NOSTRIL DAILY 48 g 1   gabapentin  (NEURONTIN ) 300 MG capsule Take 1 capsule (300 mg total) by mouth 3 (three) times daily. 90 capsule 0   glucose blood (CONTOUR NEXT TEST) test strip Use to check blood sugar up to three times daily 100 each 12   glucose blood (TRUE METRIX BLOOD GLUCOSE TEST) test strip USE AS DIRECTED 4 TIMES A DAY 400 strip 1   insulin  aspart (NOVOLOG  FLEXPEN) 100 UNIT/ML FlexPen Max daily 70 units 60 mL 6   insulin  degludec (TRESIBA  FLEXTOUCH) 100 UNIT/ML FlexTouch Pen Inject 44 Units into the skin daily. 45 mL 1   Insulin  Disposable Pump (OMNIPOD 5 G7 INTRO, GEN 5,) KIT 1 Device by Does not apply route every other day. 1 kit 0   Insulin  Disposable Pump (OMNIPOD 5 G7 PODS, GEN 5,) MISC 1 Device by Does not apply route every 3 (three) days. 45 each 3   Insulin  Pen Needle (COMFORT EZ PEN NEEDLES) 32G X 4 MM MISC 1 Device by Other route in the morning, at noon, in the evening, and at bedtime. as directed 400 each 3   metoprolol  succinate (TOPROL -XL) 50 MG 24 hr tablet TAKE ONE TABLET BY MOUTH ONCE DAILY WITH FOOD 90 tablet 3   Multiple Vitamin (MULTIVITAMIN WITH MINERALS) TABS Take 1 tablet by mouth daily.     Omega-3 Fatty Acids (FISH OIL) 1000 MG CAPS Take 1 capsule by mouth in the morning and at bedtime.     pioglitazone  (ACTOS ) 30 MG tablet Take 1 tablet (30 mg total) by mouth daily. 90 tablet 3   valsartan  (DIOVAN ) 320 MG tablet Take 1 tablet (320 mg total) by mouth every morning. 90 tablet 3   Vitamin D , Ergocalciferol , (DRISDOL ) 1.25 MG (50000 UNIT) CAPS capsule Take 1 capsule (50,000 Units total) by mouth every 7 (seven) days. 12 capsule 1   vitamin E  400 UNIT  capsule Take 400 Units by mouth daily.     amoxicillin -clavulanate (AUGMENTIN ) 875-125 MG tablet Take 1 tablet by mouth 2 (two) times daily. (Patient not taking: Reported on 05/07/2024) 20 tablet 0   empagliflozin  (JARDIANCE ) 25 MG TABS tablet Take 1 tablet (25 mg total) by mouth daily before breakfast. (Patient not taking: Reported on 05/07/2024) 90 tablet 3   methocarbamol  (ROBAXIN ) 500 MG tablet Take 1 tablet (500 mg total) by mouth 2 (two) times daily. (Patient not taking: Reported on 05/07/2024) 20 tablet 0   promethazine -dextromethorphan (PROMETHAZINE -DM) 6.25-15 MG/5ML syrup Take 5 mLs by mouth 4 (four) times daily as needed. (Patient not  taking: Reported on 05/07/2024) 118 mL 0   No current facility-administered medications for this visit.    Allergies: Metformin and related, Levemir [insulin  detemir], Clindamycin, and Crestor  [rosuvastatin  calcium ]  Past Medical History:  Diagnosis Date   Blockage of coronary artery of heart (HCC)    Depression    Diabetes mellitus    Hyperlipidemia    Hypertension    Pancreatitis    Restless leg syndrome     Past Surgical History:  Procedure Laterality Date   CATARACT EXTRACTION, BILATERAL     CORONARY ANGIOPLASTY WITH STENT PLACEMENT     FOOT SURGERY Right    cyst removal   ROTATOR CUFF REPAIR     right   ROTATOR CUFF REPAIR Left    TUBAL LIGATION      Family History  Problem Relation Age of Onset   Heart attack Father    Heart attack Brother    Diabetes Sister    Cirrhosis Sister    Hypertension Sister    Cancer Mother    Hypertension Mother    Vision loss Mother    Cancer Sister    Sleep apnea Sister    Heart Problems Sister    Diabetes Sister    Gout Sister    Diabetes Son    Heart attack Son    Diabetes Daughter    Goiter Daughter    Diabetes Daughter     Social History   Tobacco Use   Smoking status: Former    Current packs/day: 0.00    Types: Cigarettes    Quit date: 05/05/2020    Years since quitting: 4.0    Smokeless tobacco: Never   Tobacco comments:    quit in 2021  Substance Use Topics   Alcohol use: No    Subjective:   Patient is concerned about worsening pelvic pain/ pressure; concerned that something is wrong with my womb. Notes that symptoms have been present for a while but seem to be worsening lately- thinks symptoms have worsened in the past few weeks/ month; no vaginal bleeding or spotting; does feel that she having increased pain with intercourse- responds to Ibuprofen ; appetite is normal; occasional constipation- normal for me. No burning with urination; known history of uterine fibroids;    Objective:  Vitals:   05/07/24 1351  BP: 126/62  Pulse: 66  Resp: 16  Temp: 97.9 F (36.6 C)  TempSrc: Oral  SpO2: 97%  Weight: 186 lb 4 oz (84.5 kg)  Height: 5' 5 (1.651 m)    General: Well developed, well nourished, in no acute distress  Skin : Warm and dry.  Head: Normocephalic and atraumatic  Eyes: Sclera and conjunctiva clear; pupils round and reactive to light; extraocular movements intact  Ears: External normal; canals clear; tympanic membranes normal  Oropharynx: Pink, supple. No suspicious lesions  Neck: Supple without thyromegaly, adenopathy  Lungs: Respirations unlabored; clear to auscultation bilaterally without wheeze, rales, rhonchi  CVS exam: normal rate and regular rhythm.  Abdomen: Soft; nontender; nondistended; normoactive bowel sounds; no masses or hepatosplenomegaly  Neurologic: Alert and oriented; speech intact; face symmetrical; moves all extremities well; CNII-XII intact without focal deficit   Assessment:  1. Abdominal pain, unspecified abdominal location   2. Pelvic pain     Plan:  ? Etiology; will update CT abd/ pelvis with contrast for initial testing; follow up and treatment to be determined based on lab results;   No follow-ups on file.  Orders Placed This Encounter  Procedures   CT  ABDOMEN PELVIS W CONTRAST    Standing Status:   Future     Expiration Date:   05/07/2025    If indicated for the ordered procedure, I authorize the administration of contrast media per Radiology protocol:   Yes    Does the patient have a contrast media/X-ray dye allergy?:   No    Preferred imaging location?:   [facility]    If indicated for the ordered procedure, I authorize the administration of oral contrast media per Radiology protocol:   Yes   Comp Met (CMET)    Requested Prescriptions    No prescriptions requested or ordered in this encounter

## 2024-05-08 ENCOUNTER — Ambulatory Visit: Payer: Self-pay | Admitting: Family

## 2024-05-08 LAB — COMPREHENSIVE METABOLIC PANEL WITH GFR
ALT: 21 U/L (ref 0–35)
AST: 24 U/L (ref 0–37)
Albumin: 4.1 g/dL (ref 3.5–5.2)
Alkaline Phosphatase: 35 U/L — ABNORMAL LOW (ref 39–117)
BUN: 27 mg/dL — ABNORMAL HIGH (ref 6–23)
CO2: 23 meq/L (ref 19–32)
Calcium: 9.9 mg/dL (ref 8.4–10.5)
Chloride: 107 meq/L (ref 96–112)
Creatinine, Ser: 1.25 mg/dL — ABNORMAL HIGH (ref 0.40–1.20)
GFR: 41.34 mL/min — ABNORMAL LOW (ref >60.00)
Glucose, Bld: 63 mg/dL — ABNORMAL LOW (ref 70–99)
Potassium: 4.4 meq/L (ref 3.5–5.1)
Sodium: 142 meq/L (ref 135–145)
Total Bilirubin: 0.6 mg/dL (ref 0.2–1.2)
Total Protein: 6.5 g/dL (ref 6.0–8.3)

## 2024-05-08 NOTE — Progress Notes (Signed)
 Lab done in preparation for CT

## 2024-05-11 ENCOUNTER — Ambulatory Visit (HOSPITAL_BASED_OUTPATIENT_CLINIC_OR_DEPARTMENT_OTHER)
Admission: RE | Admit: 2024-05-11 | Discharge: 2024-05-11 | Disposition: A | Discharge status: Home / Self Care | Source: Ambulatory Visit | Attending: Family | Admitting: Family

## 2024-05-11 DIAGNOSIS — R109 Unspecified abdominal pain: Secondary | ICD-10-CM | POA: Insufficient documentation

## 2024-05-11 DIAGNOSIS — N858 Other specified noninflammatory disorders of uterus: Secondary | ICD-10-CM | POA: Diagnosis not present

## 2024-05-11 DIAGNOSIS — R102 Pelvic and perineal pain: Secondary | ICD-10-CM | POA: Diagnosis not present

## 2024-05-11 DIAGNOSIS — N281 Cyst of kidney, acquired: Secondary | ICD-10-CM | POA: Diagnosis not present

## 2024-05-11 MED ORDER — IOHEXOL 300 MG/ML  SOLN
80.0000 mL | Freq: Once | INTRAMUSCULAR | Status: AC | PRN
  Administered 2024-05-11: 80 mL via INTRAVENOUS

## 2024-05-12 ENCOUNTER — Other Ambulatory Visit: Payer: Self-pay | Admitting: Family

## 2024-05-12 DIAGNOSIS — R102 Pelvic and perineal pain: Secondary | ICD-10-CM

## 2024-05-12 DIAGNOSIS — R935 Abnormal findings on diagnostic imaging of other abdominal regions, including retroperitoneum: Secondary | ICD-10-CM

## 2024-05-25 NOTE — Progress Notes (Unsigned)
 Chief Complaint:*** Primary GI Doctor: Dr. Charlanne  HPI:  Patient is a 78year old female patient with past medical history of diabetes, hyperlipidemia, hypertension, and depression who presents for follow-up on *** Patient last seen in the GI office by Delon, PA on 01/25/2023 for gas, bloating, and reflux  Interval History  Patient admits/denies GERD Patient admits/denies dysphagia Patient admits/denies nausea, vomiting, or weight loss  Patient admits/denies altered bowel habits Patient admits/denies abdominal pain Patient admits/denies rectal bleeding  GI procedures/tests 08/2021 Cologuard negative 04/2017 Cologuard negative  CTAP 05/11/24 IMPRESSION: No acute findings within the abdomen or pelvis. Stable 5.6 cm left adnexal mass. This is consistent with a benign etiology, with primary considerations including pedunculated fibroid and ovarian fibrothecoma. Consider GYN consultation.   Wt Readings from Last 3 Encounters:  05/07/24 186 lb 4 oz (84.5 kg)  03/31/24 184 lb (83.5 kg)  03/09/24 182 lb (82.6 kg)      Past Medical History:  Diagnosis Date   Blockage of coronary artery of heart (HCC)    Depression    Diabetes mellitus    Hyperlipidemia    Hypertension    Pancreatitis    Restless leg syndrome     Past Surgical History:  Procedure Laterality Date   CATARACT EXTRACTION, BILATERAL     CORONARY ANGIOPLASTY WITH STENT PLACEMENT     FOOT SURGERY Right    cyst removal   ROTATOR CUFF REPAIR     right   ROTATOR CUFF REPAIR Left    TUBAL LIGATION      Current Outpatient Medications  Medication Sig Dispense Refill   Alcohol Swabs (B-D SINGLE USE SWABS REGULAR) PADS USE AS DIRECTED 400 each 1   alendronate  (FOSAMAX ) 70 MG tablet TAKE ONE TABLET BY MOUTH ONCE WEEKLY ON WEDNESDAY 12 tablet 3   ALPRAZolam  (XANAX ) 0.25 MG tablet Take 1 tablet by mouth three times a day if needed 60 tablet 1   amLODipine  (NORVASC ) 10 MG tablet Take 1 tablet (10 mg total) by mouth  daily. 90 tablet 3   amoxicillin -clavulanate (AUGMENTIN ) 875-125 MG tablet Take 1 tablet by mouth 2 (two) times daily. (Patient not taking: Reported on 05/07/2024) 20 tablet 0   aspirin  EC 81 MG tablet Take 81 mg by mouth daily.     atorvastatin  (LIPITOR) 80 MG tablet Take 1 tablet (80 mg total) by mouth every evening. 90 tablet 1   buPROPion  (WELLBUTRIN  XL) 300 MG 24 hr tablet Take 1 tablet (300 mg total) by mouth daily. 90 tablet 3   Carboxymethylcellulose Sodium (THERATEARS OP) Place 1 drop into both eyes daily as needed (dry eyes).     Ciclopirox  1 % shampoo Massage into scalp 2x a week for 4 weeks 120 mL 0   Continuous Glucose Receiver (DEXCOM G6 RECEIVER) DEVI Use to monitor glucose 1 each 0   Continuous Glucose Sensor (DEXCOM G7 SENSOR) MISC 1 Device by Does not apply route as directed. Every 10 days 9 each 3   Continuous Glucose Transmitter (DEXCOM G6 TRANSMITTER) MISC REPLACE TRANSMITTER EVERY 90 DAYS 1 each 3   empagliflozin  (JARDIANCE ) 25 MG TABS tablet Take 1 tablet (25 mg total) by mouth daily before breakfast. (Patient not taking: Reported on 05/07/2024) 90 tablet 3   esomeprazole  (NEXIUM ) 40 MG capsule Take 1 capsule (40 mg total) by mouth daily. 90 capsule 3   fenofibrate  160 MG tablet Take 1 tablet (160 mg total) by mouth every evening. 90 tablet 3   ferrous sulfate 325 (65 FE) MG  tablet Take 325 mg by mouth daily with breakfast.      fexofenadine (ALLEGRA) 180 MG tablet Take 180 mg by mouth daily as needed for allergies or rhinitis.     FLUoxetine  (PROZAC ) 40 MG capsule Take 1 capsule (40 mg total) by mouth daily. 90 capsule 3   fluticasone  (FLONASE ) 50 MCG/ACT nasal spray INSTILL 2 SPRAYS IN EACH NOSTRIL DAILY 48 g 1   gabapentin  (NEURONTIN ) 300 MG capsule Take 1 capsule (300 mg total) by mouth 3 (three) times daily. 90 capsule 0   glucose blood (CONTOUR NEXT TEST) test strip Use to check blood sugar up to three times daily 100 each 12   glucose blood (TRUE METRIX BLOOD GLUCOSE  TEST) test strip USE AS DIRECTED 4 TIMES A DAY 400 strip 1   insulin  aspart (NOVOLOG  FLEXPEN) 100 UNIT/ML FlexPen Max daily 70 units 60 mL 6   insulin  degludec (TRESIBA  FLEXTOUCH) 100 UNIT/ML FlexTouch Pen Inject 44 Units into the skin daily. 45 mL 1   Insulin  Disposable Pump (OMNIPOD 5 G7 INTRO, GEN 5,) KIT 1 Device by Does not apply route every other day. 1 kit 0   Insulin  Disposable Pump (OMNIPOD 5 G7 PODS, GEN 5,) MISC 1 Device by Does not apply route every 3 (three) days. 45 each 3   Insulin  Pen Needle (COMFORT EZ PEN NEEDLES) 32G X 4 MM MISC 1 Device by Other route in the morning, at noon, in the evening, and at bedtime. as directed 400 each 3   methocarbamol  (ROBAXIN ) 500 MG tablet Take 1 tablet (500 mg total) by mouth 2 (two) times daily. (Patient not taking: Reported on 05/07/2024) 20 tablet 0   metoprolol  succinate (TOPROL -XL) 50 MG 24 hr tablet TAKE ONE TABLET BY MOUTH ONCE DAILY WITH FOOD 90 tablet 3   Multiple Vitamin (MULTIVITAMIN WITH MINERALS) TABS Take 1 tablet by mouth daily.     Omega-3 Fatty Acids (FISH OIL) 1000 MG CAPS Take 1 capsule by mouth in the morning and at bedtime.     pioglitazone  (ACTOS ) 30 MG tablet Take 1 tablet (30 mg total) by mouth daily. 90 tablet 3   promethazine -dextromethorphan (PROMETHAZINE -DM) 6.25-15 MG/5ML syrup Take 5 mLs by mouth 4 (four) times daily as needed. (Patient not taking: Reported on 05/07/2024) 118 mL 0   valsartan  (DIOVAN ) 320 MG tablet Take 1 tablet (320 mg total) by mouth every morning. 90 tablet 3   Vitamin D , Ergocalciferol , (DRISDOL ) 1.25 MG (50000 UNIT) CAPS capsule Take 1 capsule (50,000 Units total) by mouth every 7 (seven) days. 12 capsule 1   vitamin E  400 UNIT capsule Take 400 Units by mouth daily.     No current facility-administered medications for this visit.    Allergies as of 05/26/2024 - Reviewed 05/11/2024  Allergen Reaction Noted   Metformin and related Other (See Comments) 10/23/2012   Levemir [insulin  detemir] Hives  and Swelling 01/05/2011   Clindamycin Nausea And Vomiting 11/24/2020   Crestor  [rosuvastatin  calcium ] Palpitations 07/17/2021    Family History  Problem Relation Age of Onset   Heart attack Father    Heart attack Brother    Diabetes Sister    Cirrhosis Sister    Hypertension Sister    Cancer Mother    Hypertension Mother    Vision loss Mother    Cancer Sister    Sleep apnea Sister    Heart Problems Sister    Diabetes Sister    Gout Sister    Diabetes Son    Heart  attack Son    Diabetes Daughter    Goiter Daughter    Diabetes Daughter     Review of Systems:    Constitutional: No weight loss, fever, chills, weakness or fatigue HEENT: Eyes: No change in vision               Ears, Nose, Throat:  No change in hearing or congestion Skin: No rash or itching Cardiovascular: No chest pain, chest pressure or palpitations   Respiratory: No SOB or cough Gastrointestinal: See HPI and otherwise negative Genitourinary: No dysuria or change in urinary frequency Neurological: No headache, dizziness or syncope Musculoskeletal: No new muscle or joint pain Hematologic: No bleeding or bruising Psychiatric: No history of depression or anxiety    Physical Exam:  Vital signs: There were no vitals taken for this visit.  Constitutional:   Pleasant *** female/female appears to be in NAD, Well developed, Well nourished, alert and cooperative Eyes:   PEERL, EOMI. No icterus. Conjunctiva pink. Neck:  Supple Throat: Oral cavity and pharynx without inflammation, swelling or lesion.  Respiratory: Respirations even and unlabored. Lungs clear to auscultation bilaterally.   No wheezes, crackles, or rhonchi.  Cardiovascular: Normal S1, S2. Regular rate and rhythm. No peripheral edema, cyanosis or pallor.  Gastrointestinal:  Soft, nondistended, nontender. No rebound or guarding. Normal bowel sounds. No appreciable masses or hepatomegaly. Rectal:  Not performed.  Anoscopy: Msk:  Symmetrical without  gross deformities. Without edema, no deformity or joint abnormality.  Neurologic:  Alert and  oriented x4;  grossly normal neurologically.  Skin:   Dry and intact without significant lesions or rashes.  RELEVANT LABS AND IMAGING: CBC    Latest Ref Rng & Units 03/09/2024    3:57 PM 07/12/2023    2:59 PM 11/19/2022   11:01 AM  CBC  WBC 4.0 - 10.5 K/uL 7.9  8.1  7.8   Hemoglobin 12.0 - 15.0 g/dL 87.5  88.0  86.7   Hematocrit 36.0 - 46.0 % 37.6  36.4  39.6   Platelets 150.0 - 400.0 K/uL 200.0  202  221.0      CMP     Latest Ref Rng & Units 05/07/2024    2:41 PM 03/09/2024    3:57 PM 07/12/2023    2:59 PM  CMP  Glucose 70 - 99 mg/dL 63  821  847   BUN 6 - 23 mg/dL 27  30  29    Creatinine 0.40 - 1.20 mg/dL 8.74  8.88  8.82   Sodium 135 - 145 mEq/L 142  138  137   Potassium 3.5 - 5.1 mEq/L 4.4  4.5  4.0   Chloride 96 - 112 mEq/L 107  103  103   CO2 19 - 32 mEq/L 23  28  25    Calcium  8.4 - 10.5 mg/dL 9.9  89.4  89.6   Total Protein 6.0 - 8.3 g/dL 6.5  7.0  6.3   Total Bilirubin 0.2 - 1.2 mg/dL 0.6  0.6  0.5   Alkaline Phos 39 - 117 U/L 35  37    AST 0 - 37 U/L 24  21  18    ALT 0 - 35 U/L 21  19  18       Lab Results  Component Value Date   TSH 0.91 03/09/2024  /29/24 H. pylori stool test negative  Assessment: 1. ***  Plan: 1. ***   Thank you for the courtesy of this consult. Please call me with any questions or concerns.  Aashi Derrington, FNP-C [facility] 05/25/2024, 4:23 PM  Cc: Antonio Meth, Jamee SAUNDERS, *

## 2024-05-26 ENCOUNTER — Ambulatory Visit: Admitting: Gastroenterology

## 2024-05-26 ENCOUNTER — Encounter: Payer: Self-pay | Admitting: Gastroenterology

## 2024-05-26 VITALS — BP 110/50 | HR 58 | Ht 65.0 in | Wt 169.5 lb

## 2024-05-26 DIAGNOSIS — N9489 Other specified conditions associated with female genital organs and menstrual cycle: Secondary | ICD-10-CM

## 2024-05-26 DIAGNOSIS — R143 Flatulence: Secondary | ICD-10-CM | POA: Diagnosis not present

## 2024-05-26 DIAGNOSIS — R140 Abdominal distension (gaseous): Secondary | ICD-10-CM | POA: Diagnosis not present

## 2024-05-26 DIAGNOSIS — K219 Gastro-esophageal reflux disease without esophagitis: Secondary | ICD-10-CM | POA: Diagnosis not present

## 2024-05-26 NOTE — Patient Instructions (Addendum)
 GERD Recommend GERD diet Esomeprazole  40 mg 1 tablet 45 mins before breakfast Please call office to confirm if you have the medication at home. If you are out we can send refill.  Gas/Bloat Recommend low fodmap diet OTC simethicone  or Gas-x prn gas or bloat  Left adnexal mass consistent with benign etiology Referral placed to GYN, would follow-up with PCP on referral if you have not received call  _______________________________________________________  If your blood pressure at your visit was 140/90 or greater, please contact your primary care physician to follow up on this.  _______________________________________________________  If you are age 24 or older, your body mass index should be between 23-30. Your Body mass index is 28.21 kg/m. If this is out of the aforementioned range listed, please consider follow up with your Primary Care Provider.  If you are age 59 or younger, your body mass index should be between 19-25. Your Body mass index is 28.21 kg/m. If this is out of the aformentioned range listed, please consider follow up with your Primary Care Provider.   ________________________________________________________  The [facility] providers would like to encourage you to use MYCHART to communicate with providers for non-urgent requests or questions.  Due to long hold times on the telephone, sending your provider a message by [facility] may be a faster and more efficient way to get a response.  Please allow 48 business hours for a response.  Please remember that this is for non-urgent requests.  _______________________________________________________  [facility] is using a team-based approach to care.  Your team is made up of your doctor and two to three APPS. Our APPS (Nurse Practitioners and Physician Assistants) work with your physician to ensure care continuity for you. They are fully qualified to address your health concerns and develop a treatment plan. They  communicate directly with your gastroenterologist to care for you. Seeing the Advanced Practice Practitioners on your physician's team can help you by facilitating care more promptly, often allowing for earlier appointments, access to diagnostic testing, procedures, and other specialty referrals.   Thank you for trusting me with your gastrointestinal care. Deanna May, FNP-C

## 2024-05-27 ENCOUNTER — Telehealth: Payer: Self-pay | Admitting: Gastroenterology

## 2024-05-27 NOTE — Telephone Encounter (Signed)
 Inbound call from patient wanting to advised that she does have Esomeprazole  medication and has restarted it. Please advise, thank you

## 2024-05-29 ENCOUNTER — Other Ambulatory Visit: Payer: Self-pay

## 2024-05-29 MED ORDER — EMPAGLIFLOZIN 25 MG PO TABS: 25.0000 mg | ORAL_TABLET | Freq: Every day | ORAL | 0 refills | Status: DC

## 2024-06-01 ENCOUNTER — Telehealth: Payer: Self-pay

## 2024-06-01 NOTE — Telephone Encounter (Signed)
 Patient was identified as falling into the [place] Measure - Diabetes.   Patient was: Appointment already scheduled for:  F/U with Endo on 07/31/24. SABRA

## 2024-06-09 ENCOUNTER — Ambulatory Visit (INDEPENDENT_AMBULATORY_CARE_PROVIDER_SITE_OTHER)

## 2024-06-09 VITALS — Ht 65.0 in | Wt 169.0 lb

## 2024-06-09 DIAGNOSIS — Z780 Asymptomatic menopausal state: Secondary | ICD-10-CM | POA: Diagnosis not present

## 2024-06-09 DIAGNOSIS — Z0000 Encounter for general adult medical examination without abnormal findings: Secondary | ICD-10-CM

## 2024-06-09 NOTE — Progress Notes (Signed)
 Subjective:   [name] is a 78 y.o. who presents for a Medicare Wellness preventive visit.  As a reminder, Annual Wellness Visits don't include a physical exam, and some assessments may be limited, especially if this visit is performed virtually. We may recommend an in-person follow-up visit with your provider if needed.  Visit Complete: Virtual I connected with  Orlean JAYSON Dolly on 06/09/24 by a audio enabled telemedicine application and verified that I am speaking with the correct person using two identifiers.  Patient Location: Home  Provider Location: Home Office  I discussed the limitations of evaluation and management by telemedicine. The patient expressed understanding and agreed to proceed.  Vital Signs: Because this visit was a virtual/telehealth visit, some criteria may be missing or patient reported. Any vitals not documented were not able to be obtained and vitals that have been documented are patient reported.  VideoDeclined- This patient declined Librarian, academic. Therefore the visit was completed with audio only.  Persons Participating in Visit: Patient.  AWV Questionnaire: No: Patient Medicare AWV questionnaire was not completed prior to this visit.  Cardiac Risk Factors include: advanced age (>92men, >70 women);diabetes mellitus;dyslipidemia;hypertension     Objective:    Today's Vitals   06/09/24 0842  Weight: 169 lb (76.7 kg)  Height: 5' 5 (1.651 m)   Body mass index is 28.12 kg/m.     06/09/2024    9:06 AM 10/29/2023    8:26 AM 07/03/2023   12:42 PM 10/24/2022    8:11 AM 11/11/2021    1:20 PM 05/04/2021    3:58 PM 09/23/2020    2:31 PM  Advanced Directives  Does Patient Have a Medical Advance Directive? No No No No No No No  Would patient like information on creating a medical advance directive? Yes (MAU/Ambulatory/Procedural Areas - Information given) No - Patient declined No - Patient declined No - Patient  declined   No - Patient declined    Current Medications (verified) Outpatient Encounter Medications as of 06/09/2024  Medication Sig   Alcohol Swabs (B-D SINGLE USE SWABS REGULAR) PADS USE AS DIRECTED   alendronate  (FOSAMAX ) 70 MG tablet TAKE ONE TABLET BY MOUTH ONCE WEEKLY ON WEDNESDAY   ALPRAZolam  (XANAX ) 0.25 MG tablet Take 1 tablet by mouth three times a day if needed   amLODipine  (NORVASC ) 10 MG tablet Take 1 tablet (10 mg total) by mouth daily.   aspirin  EC 81 MG tablet Take 81 mg by mouth daily.   atorvastatin  (LIPITOR) 80 MG tablet Take 1 tablet (80 mg total) by mouth every evening.   buPROPion  (WELLBUTRIN  XL) 300 MG 24 hr tablet Take 1 tablet (300 mg total) by mouth daily.   Carboxymethylcellulose Sodium (THERATEARS OP) Place 1 drop into both eyes daily as needed (dry eyes).   Ciclopirox  1 % shampoo Massage into scalp 2x a week for 4 weeks   Continuous Glucose Receiver (DEXCOM G6 RECEIVER) DEVI Use to monitor glucose   Continuous Glucose Sensor (DEXCOM G7 SENSOR) MISC 1 Device by Does not apply route as directed. Every 10 days   Continuous Glucose Transmitter (DEXCOM G6 TRANSMITTER) MISC REPLACE TRANSMITTER EVERY 90 DAYS   esomeprazole  (NEXIUM ) 40 MG capsule Take 1 capsule (40 mg total) by mouth daily.   fenofibrate  160 MG tablet Take 1 tablet (160 mg total) by mouth every evening.   ferrous sulfate 325 (65 FE) MG tablet Take 325 mg by mouth daily with breakfast.    fexofenadine (ALLEGRA) 180 MG tablet  Take 180 mg by mouth daily as needed for allergies or rhinitis.   FLUoxetine  (PROZAC ) 40 MG capsule Take 1 capsule (40 mg total) by mouth daily.   fluticasone  (FLONASE ) 50 MCG/ACT nasal spray INSTILL 2 SPRAYS IN EACH NOSTRIL DAILY   gabapentin  (NEURONTIN ) 300 MG capsule Take 1 capsule (300 mg total) by mouth 3 (three) times daily.   glucose blood (CONTOUR NEXT TEST) test strip Use to check blood sugar up to three times daily   glucose blood (TRUE METRIX BLOOD GLUCOSE TEST) test strip  USE AS DIRECTED 4 TIMES A DAY   insulin  aspart (NOVOLOG  FLEXPEN) 100 UNIT/ML FlexPen Max daily 70 units   insulin  degludec (TRESIBA  FLEXTOUCH) 100 UNIT/ML FlexTouch Pen Inject 44 Units into the skin daily.   Insulin  Disposable Pump (OMNIPOD 5 G7 INTRO, GEN 5,) KIT 1 Device by Does not apply route every other day.   Insulin  Disposable Pump (OMNIPOD 5 G7 PODS, GEN 5,) MISC 1 Device by Does not apply route every 3 (three) days.   Insulin  Pen Needle (COMFORT EZ PEN NEEDLES) 32G X 4 MM MISC 1 Device by Other route in the morning, at noon, in the evening, and at bedtime. as directed   metoprolol  succinate (TOPROL -XL) 50 MG 24 hr tablet TAKE ONE TABLET BY MOUTH ONCE DAILY WITH FOOD   Multiple Vitamin (MULTIVITAMIN WITH MINERALS) TABS Take 1 tablet by mouth daily.   Omega-3 Fatty Acids (FISH OIL) 1000 MG CAPS Take 1 capsule by mouth in the morning and at bedtime.   pioglitazone  (ACTOS ) 30 MG tablet Take 1 tablet (30 mg total) by mouth daily.   valsartan  (DIOVAN ) 320 MG tablet Take 1 tablet (320 mg total) by mouth every morning.   Vitamin D , Ergocalciferol , (DRISDOL ) 1.25 MG (50000 UNIT) CAPS capsule Take 1 capsule (50,000 Units total) by mouth every 7 (seven) days.   vitamin E  400 UNIT capsule Take 400 Units by mouth daily.   empagliflozin  (JARDIANCE ) 25 MG TABS tablet Take 1 tablet (25 mg total) by mouth daily before breakfast. (Patient not taking: Reported on 06/09/2024)   No facility-administered encounter medications on file as of 06/09/2024.    Allergies (verified) Metformin and related, Levemir [insulin  detemir], Clindamycin, and Crestor  [rosuvastatin  calcium ]   History: Past Medical History:  Diagnosis Date   Blockage of coronary artery of heart (HCC)    Depression    Diabetes mellitus    Hyperlipidemia    Hypertension    Pancreatitis    Restless leg syndrome    Past Surgical History:  Procedure Laterality Date   CATARACT EXTRACTION, BILATERAL     CORONARY ANGIOPLASTY WITH STENT  PLACEMENT     FOOT SURGERY Right    cyst removal   ROTATOR CUFF REPAIR     right   ROTATOR CUFF REPAIR Left    TUBAL LIGATION     Family History  Problem Relation Age of Onset   Heart attack Father    Heart attack Brother    Diabetes Sister    Cirrhosis Sister    Hypertension Sister    Cancer Mother    Hypertension Mother    Vision loss Mother    Cancer Sister    Sleep apnea Sister    Heart Problems Sister    Diabetes Sister    Gout Sister    Diabetes Son    Heart attack Son    Diabetes Daughter    Goiter Daughter    Diabetes Daughter    Social History  Socioeconomic History   Marital status: Married    Spouse name: Not on file   Number of children: Not on file   Years of education: Not on file   Highest education level: Not on file  Occupational History   Not on file  Tobacco Use   Smoking status: Former    Current packs/day: 0.00    Types: Cigarettes    Quit date: 05/05/2020    Years since quitting: 4.0   Smokeless tobacco: Never   Tobacco comments:    quit in 2021  Vaping Use   Vaping status: Never Used  Substance and Sexual Activity   Alcohol use: No   Drug use: No   Sexual activity: Not Currently  Other Topics Concern   Not on file  Social History Narrative   Exercise: seldom   Caffeine use: seldom   Social Drivers of Corporate investment banker Strain: Low Risk  (06/09/2024)   Overall Financial Resource Strain (CARDIA)    Difficulty of Paying Living Expenses: Not very hard  Food Insecurity: No Food Insecurity (06/09/2024)   Hunger Vital Sign    Worried About Running Out of Food in the Last Year: Never true    Ran Out of Food in the Last Year: Never true  Transportation Needs: No Transportation Needs (06/09/2024)   PRAPARE - Administrator, Civil Service (Medical): No    Lack of Transportation (Non-Medical): No  Physical Activity: Inactive (06/09/2024)   Exercise Vital Sign    Days of Exercise per Week: 0 days    Minutes of Exercise  per Session: 0 min  Stress: No Stress Concern Present (06/09/2024)   [employer] of Occupational Health - Occupational Stress Questionnaire    Feeling of Stress: Not at all  Social Connections: Moderately Integrated (06/09/2024)   Social Connection and Isolation Panel    Frequency of Communication with Friends and Family: More than three times a week    Frequency of Social Gatherings with Friends and Family: More than three times a week    Attends Religious Services: 1 to 4 times per year    Active Member of [employer] or Organizations: No    Attends Banker Meetings: Never    Marital Status: Married    Tobacco Counseling Counseling given: Not Answered Tobacco comments: quit in 2021    Clinical Intake:  Pre-visit preparation completed: Yes  Pain : No/denies pain  Diabetes: Yes CBG done?: No Did pt. bring in CBG monitor from home?: No  Lab Results  Component Value Date   HGBA1C 8.2 (A) 03/31/2024   HGBA1C 8.2 (A) 11/04/2023   HGBA1C 9.3 (H) 07/12/2023     How often do you need to have someone help you when you read instructions, pamphlets, or other written materials from your doctor or pharmacy?: 1 - Never  Interpreter Needed?: No  Information entered by :: Charmaine Bloodgood LPN   Activities of Daily Living     06/09/2024    9:05 AM 05/07/2024    2:00 PM  In your present state of health, do you have any difficulty performing the following activities:  Hearing? 0 0  Vision? 0 0  Difficulty concentrating or making decisions? 1 0  Walking or climbing stairs? 0 0  Dressing or bathing? 0 0  Doing errands, shopping? 0 0  Preparing Food and eating ? N   Using the Toilet? N   In the past six months, have you accidently leaked urine? N  Do you have problems with loss of bowel control? N   Managing your Medications? N   Managing your Finances? N   Housekeeping or managing your Housekeeping? N     Patient Care Team: Antonio Meth, Jamee SAUNDERS, DO as PCP -  General Lonni Slain, MD as PCP - Cardiology (Cardiology) Gillie Duncans, MD as Consulting Physician (Neurosurgery) Cleatus Collar, MD as Consulting Physician (Ophthalmology) Carla Milling, RPH-CPP (Pharmacist) [facility], Donell Cardinal, MD as Consulting Physician (Endocrinology) Abigail Maude P[facility]) May, Deanna J, NP as Nurse Practitioner (Gastroenterology)  I have updated your Care Teams any recent Medical Services you may have received from other providers in the past year.     Assessment:   This is a routine wellness examination for [place].  Hearing/Vision screen Hearing Screening - Comments:: Denies hearing difficulties   Vision Screening - Comments:: Wears rx glasses - up to date with routine eye exams with Dr. Abigail    Goals Addressed             This Visit's Progress    COMPLETED: Increase physical activity       Start Silver Sneakers.     Maintain health and independence   On track    COMPLETED: Quit smoking / using tobacco         Depression Screen     06/09/2024    8:53 AM 03/09/2024    3:11 PM 10/29/2023    8:30 AM 07/12/2023    2:57 PM 04/15/2023    1:54 PM 11/19/2022   10:20 AM 10/24/2022    8:07 AM  PHQ 2/9 Scores  PHQ - 2 Score 4 6 0 4 2 0 0  PHQ- 9 Score 11 16  8 3 3      Fall Risk     06/09/2024    9:05 AM 05/07/2024    2:00 PM 10/29/2023    8:23 AM 07/12/2023    2:56 PM 10/24/2022    8:11 AM  Fall Risk   Falls in the past year? 0 0 0 0 0  Number falls in past yr: 0 0 0 0 0  Injury with Fall? 0 0 0 0 0  Risk for fall due to : No Fall Risks  No Fall Risks  Impaired balance/gait  Follow up Falls prevention discussed;Education provided;Falls evaluation completed Falls evaluation completed;Education provided Falls evaluation completed Falls evaluation completed Falls prevention discussed      Data saved with a previous flowsheet row definition    MEDICARE RISK AT HOME:  Medicare Risk at Home Any stairs in or around the home?: No If  so, are there any without handrails?: No Home free of loose throw rugs in walkways, pet beds, electrical cords, etc?: Yes Adequate lighting in your home to reduce risk of falls?: Yes Life alert?: No Use of a cane, walker or w/c?: No Grab bars in the bathroom?: Yes Shower chair or bench in shower?: No Elevated toilet seat or a handicapped toilet?: Yes  TIMED UP AND GO:  Was the test performed?  No  Cognitive Function: 6CIT completed    04/01/2017    9:12 AM  MMSE - Mini Mental State Exam  Orientation to time 5   Orientation to Place 5   Registration 3   Attention/ Calculation 5   Recall 2   Language- name 2 objects 2   Language- repeat 1  Language- follow 3 step command 3   Language- read & follow direction 1   Write a sentence  1   Copy design 1   Total score 29      Data saved with a previous flowsheet row definition        06/09/2024    9:06 AM 10/29/2023    8:32 AM 10/24/2022    8:12 AM 05/11/2019    1:17 PM  6CIT Screen  What Year? 0 points 0 points 0 points 0 points  What month? 0 points 0 points 0 points 0 points  What time? 0 points 0 points 0 points 0 points  Count back from 20 0 points 0 points 0 points 0 points  Months in reverse 2 points 0 points 0 points 0 points  Repeat phrase 4 points 4 points 0 points 0 points  Total Score 6 points 4 points 0 points 0 points    Immunizations Immunization History  Administered Date(s) Administered   Fluad Quad(high Dose 65+) 07/28/2019, 06/20/2021   Fluad Trivalent(High Dose 65+) 07/12/2023   INFLUENZA, HIGH DOSE SEASONAL PF 08/09/2014, 07/05/2015, 07/21/2018   Influenza Split 08/08/2012   PFIZER(Purple Top)SARS-COV-2 Vaccination 12/07/2019, 01/04/2020   PPD Test 08/27/2011, 09/04/2011, 07/13/2014, 09/23/2015   Pneumococcal Conjugate-13 08/09/2014   Pneumococcal Polysaccharide-23 11/17/2013   Td 03/11/2007   Zoster, Live 03/11/2007    Screening Tests Health Maintenance  Topic Date Due   Zoster Vaccines-  Shingrix (1 of 2) 01/09/1996   DTaP/Tdap/Td (2 - Tdap) 03/10/2017   INFLUENZA VACCINE  05/08/2024   COVID-19 Vaccine (3 - 2025-26 season) 06/08/2024   Diabetic kidney evaluation - Urine ACR  07/11/2024   HEMOGLOBIN A1C  09/30/2024   FOOT EXAM  11/03/2024   OPHTHALMOLOGY EXAM  12/03/2024   Diabetic kidney evaluation - eGFR measurement  05/07/2025   Medicare Annual Wellness (AWV)  06/09/2025   Pneumococcal Vaccine: 50+ Years  Completed   DEXA SCAN  Completed   Hepatitis C Screening  Completed   HPV VACCINES  Aged Out   Meningococcal B Vaccine  Aged Out   Fecal DNA (Cologuard)  Discontinued    Health Maintenance  Health Maintenance Due  Topic Date Due   Zoster Vaccines- Shingrix (1 of 2) 01/09/1996   DTaP/Tdap/Td (2 - Tdap) 03/10/2017   INFLUENZA VACCINE  05/08/2024   COVID-19 Vaccine (3 - 2025-26 season) 06/08/2024   Health Maintenance Items Addressed: DEXA ordered; information provided on recommended vaccines   Additional Screening:  Vision Screening: Recommended annual ophthalmology exams for early detection of glaucoma and other disorders of the eye. Would you like a referral to an eye doctor? No    Dental Screening: Recommended annual dental exams for proper oral hygiene  Community Resource Referral / Chronic Care Management: CRR required this visit?  No   CCM required this visit?  No   Plan:    I have personally reviewed and noted the following in the patient's chart:   Medical and social history Use of alcohol, tobacco or illicit drugs  Current medications and supplements including opioid prescriptions. Patient is not currently taking opioid prescriptions. Functional ability and status Nutritional status Physical activity Advanced directives List of other physicians Hospitalizations, surgeries, and ER visits in previous 12 months Vitals Screenings to include cognitive, depression, and falls Referrals and appointments  In addition, I have reviewed and  discussed with patient certain preventive protocols, quality metrics, and best practice recommendations. A written personalized care plan for preventive services as well as general preventive health recommendations were provided to patient.   Lavelle Pfeiffer [place], [place]   [id number]   After Visit  Summary: (Pick Up) Due to this being a telephonic visit, with patients personalized plan was offered to patient and patient has requested to Pick up at office.  Notes: Nothing significant to report at this time.

## 2024-06-09 NOTE — Patient Instructions (Signed)
 [name] , Thank you for taking time out of your busy schedule to complete your Annual Wellness Visit with me. I enjoyed our conversation and look forward to speaking with you again next year. I, as well as your care team,  appreciate your ongoing commitment to your health goals. Please review the following plan we discussed and let me know if I can assist you in the future. Your Game plan/ To Do List    Referrals: You have an order for:  []   2D Mammogram  []   3D Mammogram  [x]   Bone Density     Please call for appointment:   [facility] [address] [place], [place] [address] [phone]    Make sure to wear two-piece clothing.  No lotions, powders, or deodorants the day of the appointment. Make sure to bring picture ID and insurance card.  Bring list of medications you are currently taking including any supplements.   Follow up Visits: We will see or speak with you next year for your Next Medicare AWV with our clinical staff Have you seen your provider in the last 6 months (3 months if uncontrolled diabetes)? Yes  Clinician Recommendations:  Aim for 30 minutes of exercise or brisk walking, 6-8 glasses of water, and 5 servings of fruits and vegetables each day.       This is a list of the screenings recommended for you:  Health Maintenance  Topic Date Due   Zoster (Shingles) Vaccine (1 of 2) 01/09/1996   DTaP/Tdap/Td vaccine (2 - Tdap) 03/10/2017   Flu Shot  05/08/2024   COVID-19 Vaccine (3 - 2025-26 season) 06/08/2024   Yearly kidney health urinalysis for diabetes  07/11/2024   Hemoglobin A1C  09/30/2024   Complete foot exam   11/03/2024   Eye exam for diabetics  12/03/2024   Yearly kidney function blood test for diabetes  05/07/2025   Medicare Annual Wellness Visit  06/09/2025   Pneumococcal Vaccine for age over 34  Completed   DEXA scan (bone density measurement)  Completed   Hepatitis C Screening  Completed   HPV  Vaccine  Aged Out   Meningitis B Vaccine  Aged Out   Cologuard (Stool DNA test)  Discontinued    Advanced directives: (ACP Link)Information on Advanced Care Planning can be found at [place]  Secretary of [facility] Advance Health Care Directives Advance Health Care Directives. [email or web address]   Advance Care Planning is important because it:  [x]  Makes sure you receive the medical care that is consistent with your values, goals, and preferences  [x]  It provides guidance to your family and loved ones and reduces their decisional burden about whether or not they are making the right decisions based on your wishes.  Follow the link provided in your after visit summary or read over the paperwork we have mailed to you to help you started getting your Advance Directives in place. If you need assistance in completing these, please reach out to us  so that we can help you!  See attachments for Preventive Care and Fall Prevention Tips.

## 2024-06-12 ENCOUNTER — Telehealth (HOSPITAL_BASED_OUTPATIENT_CLINIC_OR_DEPARTMENT_OTHER): Payer: Self-pay | Admitting: Cardiology

## 2024-06-12 ENCOUNTER — Telehealth: Payer: Self-pay | Admitting: Family Medicine

## 2024-06-12 ENCOUNTER — Ambulatory Visit: Payer: Self-pay

## 2024-06-12 NOTE — Telephone Encounter (Signed)
 Patient reports chest pain that has been coming and going for the past week. She states that it is a burning sensation. She states that she feels it with exertion and at rest. She states that it is worse after she eats. She states that she also has been having some shortness of breath with exertion. She has not been keeping up with her blood pressures. She denies any current chest pain. Patient has been advised on ER precautions. She has been scheduled for an overdue follow up appointment next week.

## 2024-06-12 NOTE — Telephone Encounter (Signed)
 FYI Only or Action Required?: FYI only for provider.  Patient was last seen in primary care on 05/07/2024 by Jason Leita Repine, FNP.  Called Nurse Triage reporting Shortness of Breath.  Symptoms began 2 weeks ago.  Interventions attempted: Nothing.  Symptoms are: unchanged.  Triage Disposition: See HCP Within 4 Hours (Or PCP Triage)  Patient/caregiver understands and will follow disposition?: Yes        Copied from CRM #[id number]. Topic: Clinical - Red Word Triage >> Jun 12, 2024  1:06 PM Jayma L wrote: Red Word that prompted transfer to Nurse Triage: really tired and weak , has shortness of breath really bad past week . She's not sleeping at night and waking up seeing different colors.         Reason for Disposition  [1] MILD difficulty breathing (e.g., minimal/no SOB at rest, SOB with walking, pulse < 100) AND [2] NEW-onset or WORSE than normal  Answer Assessment - Initial Assessment Questions 1. RESPIRATORY STATUS: Describe your breathing? (e.g., wheezing, shortness of breath, unable to speak, severe coughing)      Short of breath  2. ONSET: When did this breathing problem begin?      1 week ago 3. PATTERN Does the difficult breathing come and go, or has it been constant since it started?      Intermittent  4. SEVERITY: How bad is your breathing? (e.g., mild, moderate, severe)      Mild to moderate  5. RECURRENT SYMPTOM: Have you had difficulty breathing before? If Yes, ask: When was the last time? and What happened that time?      No 6. CARDIAC HISTORY: Do you have any history of heart disease? (e.g., heart attack, angina, bypass surgery, angioplasty)      Yes 7. LUNG HISTORY: Do you have any history of lung disease?  (e.g., pulmonary embolus, asthma, emphysema)     No 8. CAUSE: What do you think is causing the breathing problem?      Unsure  9. OTHER SYMPTOMS: Do you have any other symptoms? (e.g., chest pain, cough, dizziness, fever,  runny nose)     Increased fatigue, I feel bad in the morning, pain in left armpit x4 weeks 10. O2 SATURATION MONITOR:  Do you use an oxygen saturation monitor (pulse oximeter) at home? If Yes, ask: What is your reading (oxygen level) today? What is your usual oxygen saturation reading? (e.g., 95%)       No  Protocols used: Breathing Difficulty-A-AH

## 2024-06-12 NOTE — Telephone Encounter (Signed)
 Pt made aware.

## 2024-06-12 NOTE — Telephone Encounter (Signed)
FYI. Pt going to ED.  

## 2024-06-12 NOTE — Telephone Encounter (Signed)
  Pt c/o of Chest Pain: STAT if active CP, including tightness, pressure, jaw pain, radiating pain to shoulder/upper arm/back, CP unrelieved by Nitro. Symptoms reported of SOB, nausea, vomiting, sweating.  1. Are you having CP right now? No, Burning sensation on her chest and she feels pain on her left arm and elbow    2. Are you experiencing any other symptoms (ex. SOB, nausea, vomiting, sweating)? Feels extremely weak and SOB on exertion    3. Is your CP continuous or coming and going? coming and going   4. Have you taken Nitroglycerin? No    5. How long have you been experiencing CP? 1 week     6. If NO CP at time of call then end call with telling Pt to call back or call 911 if Chest pain returns prior to return call from triage team.

## 2024-06-12 NOTE — Telephone Encounter (Signed)
 Copied from CRM [id number]. Topic: Referral - Question >> Jun 12, 2024  1:03 PM Jayma L wrote: Reason for CRM: patient called in asking who her cardio dr is and phone number, I advised I wasn't seeing it and id send a message back

## 2024-06-16 DIAGNOSIS — E113391 Type 2 diabetes mellitus with moderate nonproliferative diabetic retinopathy without macular edema, right eye: Secondary | ICD-10-CM | POA: Diagnosis not present

## 2024-06-16 DIAGNOSIS — E113292 Type 2 diabetes mellitus with mild nonproliferative diabetic retinopathy without macular edema, left eye: Secondary | ICD-10-CM | POA: Diagnosis not present

## 2024-06-16 LAB — HM DIABETES EYE EXAM

## 2024-06-18 NOTE — Progress Notes (Unsigned)
 Cardiology Office Note   Date:  06/19/2024  ID:  [name], DOB 03/29/1946, MRN [id number] PCP: Antonio Meth, Jamee SAUNDERS, DO  [facility] Providers Cardiologist:  Shelda Bruckner, MD     History of Present Illness [name] is a 78 y.o. female  with a hx of CAD, DM 2, hyperlipidemia, hypertension, CKD 3a, RBBB, GERD, pancreatitis   Prior history of cardiac stents the uncertain where, what type, how many.  Last cath 2001.  Echo and Lexi scan ordered in 2017 due to fatigue.  Echo 02/2016 normal LVEF 60 to 65%, grade 1 diastolic dysfunction, mild MR.  Myoview  02/19/2016 with no reversible ischemia or infarction, EF 58%, low risk study.   At visit 02/06/22 she was referred to PREP exercise program. Recommended to follow up in 6 months but lost to follow up since that time.   She contacted the office 06/12/24 with chest pain on and off for one week described as burning worse after eating at rest or with exertion. She also noted exertional dyspnea. Overdue follow up was scheduled.   She also called PCP on 06/12/24 and reports vision changes with color distortion. She saw ophthalmology 06/17/24 with good report.   Presents today for follow up. She notes chest pain/burning occurring daily after lunch and continuing for several hours. Pepto bismol and alka seltzer alleviate symptoms. She reports burning in chest also occurs with walking x2 weeks, with associated symptoms of left axillary pain. She has shortness of breath when supine and with walking x2 weeks. Notes fatigue for several months, worse in AM, wakes up not well rested. PCP provided iron supplementation with has helped. She commented on her weight trends, noting she feels her baseline weight is around 174lbs. She is up 23lbs from 05/26/24. She reports abdominal swelling/fullness and bilateral LE edema in evenings. She does not have a home scale.   Prior cardiac stents x2 placed at [facility], following abnormal stress  test. Asymptomatic at that time.   She occasionally checks BP at home with readings: 140s-150s/70s/80s.   Of note she was seen 05/26/24 by GI with significant heartburn, gas, bloat. Esomeprazole  was resumed and IB Guard sample provided.  Her symptoms have not greatly improved with this regimen.  She is compliant with current medications. She is not on Jardiance  due to thrush. She has follow up with endocrinology next month.  ROS: Please see the history of present illness.    All other systems reviewed and are negative.   Studies Reviewed EKG Interpretation Date/Time:  Friday June 19 2024 09:16:19 EDT Ventricular Rate:  61 PR Interval:  170 QRS Duration:  128 QT Interval:  460 QTC Calculation: 463 R Axis:   -65  Text Interpretation: Normal sinus rhythm Right bundle branch block Left anterior fascicular block Stable TWI lead III More pronounce TWI in lateral leads compared to prior Confirmed by Vannie Mora ([id number]) on 06/19/2024 9:23:56 AM    Cardiac Studies & Procedures   ______________________________________________________________________________________________   STRESS TESTS  MYOCARDIAL PERFUSION IMAGING 09/18/2018  Interpretation Summary  The left ventricular ejection fraction is normal (55-65%).  Nuclear stress EF: 62%.  No change in RBBB with inferolateral ST/T abnormality during infusion from baseline  There is a medium defect of moderate severity present in the mid inferolateral, mid anterolateral and apical lateral location. The defect is non-reversible. In the setting of normal LVF this is consistent with breast attenuation artifact. No ischemia noted.  This is a low risk study.  ECHOCARDIOGRAM  ECHOCARDIOGRAM COMPLETE 02/19/2016  Narrative *[place]* *[facility]* [address] [place], [place] [address] [phone]  ------------------------------------------------------------------- Transthoracic  Echocardiography  Patient:    [name], [name] MR #:       [id number] Study Date: 02/19/2016 Gender:     F Age:        70 Height:     165.1 cm Weight:     76.7 kg BSA:        1.9 m^2 Pt. Status: Room:       3W09C  ADMITTING    Milissa Tod JINNY TISA Milissa Tod J REFERRING    Milissa Tod J ATTENDING    Ward, Josette SAILOR PERFORMING   [facility], Inpatient SONOGRAPHER  Thersia Lander, RDCS  cc:  ------------------------------------------------------------------- LV EF: 60% -   65%  ------------------------------------------------------------------- Indications:      CHF - 428.0.  ------------------------------------------------------------------- History:   Risk factors:  Hypertension. Diabetes mellitus.  ------------------------------------------------------------------- Study Conclusions  - Left ventricle: The cavity size was normal. There was mild concentric hypertrophy. Systolic function was normal. The estimated ejection fraction was in the range of 60% to 65%. Wall motion was normal; there were no regional wall motion abnormalities. Doppler parameters are consistent with abnormal left ventricular relaxation (grade 1 diastolic dysfunction). Doppler parameters are consistent with elevated ventricular end-diastolic filling pressure. - Aortic valve: There was no regurgitation. - Aortic root: The aortic root was normal in size. - Mitral valve: Structurally normal valve. There was mild regurgitation. - Left atrium: The atrium was normal in size. - Right ventricle: The cavity size was normal. Wall thickness was normal. Systolic function was normal. - Tricuspid valve: There was mild regurgitation. - Pulmonary arteries: Systolic pressure was within the normal range. - Inferior vena cava: The vessel was normal in size. - Pericardium, extracardiac: There was no pericardial effusion.  Transthoracic echocardiography.  M-mode, complete 2D, spectral Doppler, and color  Doppler.  Birthdate:  Patient birthdate: August 05, 1946.  Age:  Patient is 77 yr old.  Sex:  Gender: female. BMI: 28.2 kg/m^2.  Blood pressure:     110/60  Patient status: Inpatient.  Study date:  Study date: 02/19/2016. Study time: 04:29 PM.  Location:  Bedside.  -------------------------------------------------------------------  ------------------------------------------------------------------- Left ventricle:  The cavity size was normal. There was mild concentric hypertrophy. Systolic function was normal. The estimated ejection fraction was in the range of 60% to 65%. Wall motion was normal; there were no regional wall motion abnormalities. Doppler parameters are consistent with abnormal left ventricular relaxation (grade 1 diastolic dysfunction). Doppler parameters are consistent with elevated ventricular end-diastolic filling pressure.  ------------------------------------------------------------------- Aortic valve:   Trileaflet; mildly thickened leaflets. Mobility was not restricted.  Doppler:  Transvalvular velocity was within the normal range. There was no stenosis. There was no regurgitation.  ------------------------------------------------------------------- Aorta:  Aortic root: The aortic root was normal in size.  ------------------------------------------------------------------- Mitral valve:   Structurally normal valve.   Mobility was not restricted.  Doppler:  Transvalvular velocity was within the normal range. There was no evidence for stenosis. There was mild regurgitation.    Peak gradient (D): 4 mm Hg.  ------------------------------------------------------------------- Left atrium:  The atrium was normal in size.  ------------------------------------------------------------------- Right ventricle:  The cavity size was normal. Wall thickness was normal. Systolic function was normal.  ------------------------------------------------------------------- Pulmonic  valve:    Structurally normal valve.   Cusp separation was normal.  Doppler:  Transvalvular velocity was within the normal range. There was no  evidence for stenosis. There was no significant regurgitation.  ------------------------------------------------------------------- Tricuspid valve:   Structurally normal valve.    Doppler: Transvalvular velocity was within the normal range. There was mild regurgitation.  ------------------------------------------------------------------- Pulmonary artery:   The main pulmonary artery was normal-sized. Systolic pressure was within the normal range.  ------------------------------------------------------------------- Right atrium:  The atrium was normal in size.  ------------------------------------------------------------------- Pericardium:  There was no pericardial effusion.  ------------------------------------------------------------------- Systemic veins: Inferior vena cava: The vessel was normal in size.  ------------------------------------------------------------------- Measurements  Left ventricle                           Value        Reference LV ID, ED, PLAX chordal          (L)     40.9  mm     43 - 52 LV ID, ES, PLAX chordal                  26.9  mm     23 - 38 LV fx shortening, PLAX chordal           34    %      >=29 LV PW thickness, ED                      6.69  mm     --------- IVS/LV PW ratio, ED                      1.12         <=1.3 LV ejection fraction, 1-p A4C            57    %      --------- LV end-diastolic volume, 2-p             86    ml     --------- LV end-systolic volume, 2-p              34    ml     --------- LV ejection fraction, 2-p                60    %      --------- Stroke volume, 2-p                       52    ml     --------- LV end-diastolic volume/bsa, 2-p         45    ml/m^2 --------- LV end-systolic volume/bsa, 2-p          18    ml/m^2 --------- Stroke volume/bsa, 2-p                    27.5  ml/m^2 --------- LV e&', lateral                           4.9   cm/s   --------- LV E/e&', lateral                         19.51        --------- LV e&', medial                            4.68  cm/s   --------- LV E/e&', medial  20.43        --------- LV e&', average                           4.79  cm/s   --------- LV E/e&', average                         19.96        ---------  Ventricular septum                       Value        Reference IVS thickness, ED                        7.49  mm     ---------  LVOT                                     Value        Reference LVOT ID, S                               20    mm     --------- LVOT area                                3.14  cm^2   ---------  Aorta                                    Value        Reference Aortic root ID, ED                       30    mm     ---------  Left atrium                              Value        Reference LA ID, A-P, ES                           26    mm     --------- LA ID/bsa, A-P                           1.37  cm/m^2 <=2.2 LA volume, S                             58.8  ml     --------- LA volume/bsa, S                         31    ml/m^2 --------- LA volume, ES, 1-p A4C                   57    ml     --------- LA volume/bsa, ES, 1-p A4C               30.1  ml/m^2 ---------  LA volume, ES, 1-p A2C                   59    ml     --------- LA volume/bsa, ES, 1-p A2C               31.1  ml/m^2 ---------  Mitral valve                             Value        Reference Mitral E-wave peak velocity              95.6  cm/s   --------- Mitral A-wave peak velocity              99    cm/s   --------- Mitral deceleration time         (H)     310   ms     150 - 230 Mitral peak gradient, D                  4     mm Hg  --------- Mitral E/A ratio, peak                   1            ---------  Systemic veins                           Value        Reference Estimated CVP                             3     mm Hg  ---------  Right ventricle                          Value        Reference TAPSE                                    28.7  mm     --------- RV s&', lateral, S                        7.98  cm/s   ---------  Legend: (L)  and  (H)  mark values outside specified reference range.  ------------------------------------------------------------------- Prepared and Electronically Authenticated by  Leim Moose, M.D. 2017-05-15T14:34:52          ______________________________________________________________________________________________      Risk Assessment/Calculations          Physical Exam VS:  BP (!) 140/70   Pulse 61   Ht 5' 5 (1.651 m)   Wt 192 lb 9.6 oz (87.4 kg)   SpO2 92%   BMI 32.05 kg/m        Wt Readings from Last 3 Encounters:  06/19/24 192 lb 9.6 oz (87.4 kg)  06/09/24 169 lb (76.7 kg)  05/26/24 169 lb 8 oz (76.9 kg)    BP Readings from Last 3 Encounters:  06/19/24 (!) 140/70  05/26/24 (!) 110/50  05/07/24 126/62     GEN: Well nourished, well developed in no acute distress NECK: No JVD; No carotid bruits CARDIAC: RRR, no murmurs, rubs, gallops; radial and DP pulses 2+  RESPIRATORY:  Clear to auscultation without rales, wheezing or rhonchi  ABDOMEN: Soft, non-tender, non-distended EXTREMITIES:  No edema; No deformity   ASSESSMENT AND PLAN  CAD/Dyspnea - Prior stenting with unknown vessel and stent type. Mixed typical and atypical anginal symptoms of post-prandial chest burning and chest pain/burning with minimal exertion; given CAD history and associated symptoms of dyspnea and fatigue, ischemia evaluation warranted. EKG today with progressive lateral TWI. Continue ASA 81mg  daily, atorvastatin  80mg  daily, metoprolol  succinate 50mg  daily, valsartan  320mg  daily Will plan for lexiscan  myoview   Dyspnea/Fatigue/Weight Gain - worsening symptoms x2 weeks with 23lb weight gain; echo from 2017 with LVEF 60-65% and no WMA, G1DD,  mild MR Will plan for echocardiogram  Trial of lasix  20mg  PO daily x3 days then as directed BNP, BMET, CBC ordered - results pending   HLD, LDL goal <70 - 07/2023 LDL 60.  Continue atorvastatin  80mg  daily, fenofibrate  160mg  daily  Consider fasting lipid panel at next appointment  HTN - uncontrolled today by in office BP reading and home BP readings, with intermittent readings at goal of less than 130/80. Recent outside provider readings at goal <130/80. Adding Lasix  20mg  daily x 3 days for suspected volume overload which may contribute to hypertension, as above. Reassess BP control Monday based on response to Lasix  and pending labs. Continue amlodipine  10mg  daily, metoprolol  succinate 50mg  daily, valsartan  320mg  daily 04/2024 labs: BUN 27, creat 1.25, Na 142, K+ 4.4, GFR 41.34 BMET ordered today - results pending Consider addition of thiazide at follow up  DM2 - follows with endocrinology, last A1c 8.2% (03/2024) Jardiance  on hold secondary to thrush  Continue insulin  and actos  as prescribed    Informed Consent   Shared Decision Making/Informed Consent The risks [chest pain, shortness of breath, cardiac arrhythmias, dizziness, blood pressure fluctuations, myocardial infarction, stroke/transient ischemic attack, nausea, vomiting, allergic reaction, radiation exposure, metallic taste sensation and life-threatening complications (estimated to be 1 in 10,000)], benefits (risk stratification, diagnosing coronary artery disease, treatment guidance) and alternatives of a nuclear stress test were discussed in detail with [name] and she agrees to proceed.     Dispo: 4-6 weeks, after testing  Signed, Reche GORMAN Finder, NP

## 2024-06-19 ENCOUNTER — Telehealth (HOSPITAL_COMMUNITY): Payer: Self-pay | Admitting: Radiology

## 2024-06-19 ENCOUNTER — Other Ambulatory Visit (HOSPITAL_BASED_OUTPATIENT_CLINIC_OR_DEPARTMENT_OTHER): Payer: Self-pay | Admitting: Family

## 2024-06-19 ENCOUNTER — Encounter (HOSPITAL_BASED_OUTPATIENT_CLINIC_OR_DEPARTMENT_OTHER): Payer: Self-pay | Admitting: Family

## 2024-06-19 ENCOUNTER — Ambulatory Visit (HOSPITAL_BASED_OUTPATIENT_CLINIC_OR_DEPARTMENT_OTHER): Admitting: Family

## 2024-06-19 ENCOUNTER — Telehealth: Payer: Self-pay | Admitting: Dietician

## 2024-06-19 VITALS — BP 140/70 | HR 61 | Ht 65.0 in | Wt 192.6 lb

## 2024-06-19 DIAGNOSIS — R0609 Other forms of dyspnea: Secondary | ICD-10-CM

## 2024-06-19 DIAGNOSIS — I2510 Atherosclerotic heart disease of native coronary artery without angina pectoris: Secondary | ICD-10-CM

## 2024-06-19 DIAGNOSIS — R5383 Other fatigue: Secondary | ICD-10-CM

## 2024-06-19 DIAGNOSIS — E785 Hyperlipidemia, unspecified: Secondary | ICD-10-CM | POA: Diagnosis not present

## 2024-06-19 DIAGNOSIS — I10 Essential (primary) hypertension: Secondary | ICD-10-CM

## 2024-06-19 MED ORDER — FUROSEMIDE 20 MG PO TABS: 20.0000 mg | ORAL_TABLET | Freq: Every day | ORAL | 0 refills | Status: DC

## 2024-06-19 NOTE — Patient Instructions (Addendum)
 Medication Instructions:   START Furosemide  20mg  daily for three days then as directed by cardiology  *If you need a refill on your cardiac medications before your next appointment, please call your pharmacy*  Lab Work: Your physician recommends that you return for lab work today: BMET, BNP, CBC  If you have labs (blood work) drawn today and your tests are completely normal, you will receive your results only by: MyChart Message (if you have MyChart) OR A paper copy in the mail If you have any lab test that is abnormal or we need to change your treatment, we will call you to review the results.  Testing/Procedures: Your physician has requested that you have an echocardiogram. Echocardiography is a painless test that uses sound waves to create images of your heart. It provides your doctor with information about the size and shape of your heart and how well your heart's chambers and valves are working. This procedure takes approximately one hour. There are no restrictions for this procedure. Please do NOT wear cologne, perfume or lotions (deodorant is allowed). Please arrive 15 minutes prior to your appointment time.    You are scheduled for a Myocardial Perfusion Imaging Study on Monday, September 15 at 1:00pm.   Please arrive 15 minutes prior to your appointment time for registration and insurance purposes.   The test will take approximately 3 to 4 hours to complete; you may bring reading material. If someone comes with you to your appointment, they will need to remain in the main lobby due to limited space in the testing area.   How to prepare for your Myocardial Perfusion test:   Do not eat or drink 3 hours prior to your test, except you may have water.    Do not consume products containing caffeine (regular or decaffeinated) 12 hours prior to your test (ex: coffee, chocolate, soda, tea)   Do bring a list of your current medications with you. If not listed below, you may take your  medications as normal.   Bring any held medication to your appointment, as you may be required to take it once the test is complete.   Do wear comfortable clothes (no dresses or overalls) and walking shoes. Tennis shoes are preferred. No heels or open toed shoes.  Do not wear cologne, perfume or lotions (deodorant is allowed).   If these instructions are not followed, you test will have to be rescheduled.   Please report to [address] for your test. If you have questions or concerns about your appointment, please call the Nuclear Lab at #[phone].  If you cannot keep your appointment, please provide 24 hour notification to the Nuclear lab to avoid a possible $50 charge to your account.      Follow-Up: At [facility], you and your health needs are our priority.  As part of our continuing mission to provide you with exceptional heart care, our providers are all part of one team.  This team includes your primary Cardiologist (physician) and Advanced Practice Providers or APPs (Physician Assistants and Nurse Practitioners) who all work together to provide you with the care you need, when you need it.  Your next appointment:   After cardiac testing with Dr. Lonni, Reche GORMAN Finder, NP, or Rosaline Bane, NP  We recommend signing up for the patient portal called MyChart.  Sign up information is provided on this After Visit Summary.  MyChart is used to connect with patients for Virtual Visits (Telemedicine).  Patients  are able to view lab/test results, encounter notes, upcoming appointments, etc.  Non-urgent messages can be sent to your provider as well.   To learn more about what you can do with MyChart, go to [email or web address].

## 2024-06-19 NOTE — Telephone Encounter (Signed)
 Message received that patient is out of Dexcom G6.  Chart reviewed.  Noted that she has a script for Dexcom G7.  Called patient but she was unavailable and her voicemail is full.  Leita Constable, RD, LDN, CDCES, DipACLM

## 2024-06-19 NOTE — Telephone Encounter (Signed)
 Patient given detailed instructions per Myocardial Perfusion Study Information Sheet for the test on 06/22/24 at 1pm. Patient notified to arrive 15 minutes early and that it is imperative to arrive on time for appointment to keep from having the test rescheduled.  If you need to cancel or reschedule your appointment, please call the office within 24 hours of your appointment. . Patient verbalized understanding.EHK

## 2024-06-21 ENCOUNTER — Ambulatory Visit (HOSPITAL_BASED_OUTPATIENT_CLINIC_OR_DEPARTMENT_OTHER): Payer: Self-pay | Admitting: Family

## 2024-06-21 DIAGNOSIS — R5383 Other fatigue: Secondary | ICD-10-CM

## 2024-06-21 DIAGNOSIS — R7989 Other specified abnormal findings of blood chemistry: Secondary | ICD-10-CM

## 2024-06-21 DIAGNOSIS — Z79899 Other long term (current) drug therapy: Secondary | ICD-10-CM

## 2024-06-21 DIAGNOSIS — I10 Essential (primary) hypertension: Secondary | ICD-10-CM

## 2024-06-21 LAB — CBC
Hematocrit: 34.4 % (ref 34.0–46.6)
Hemoglobin: 10.8 g/dL — ABNORMAL LOW (ref 11.1–15.9)
MCH: 29.7 pg (ref 26.6–33.0)
MCHC: 31.4 g/dL — ABNORMAL LOW (ref 31.5–35.7)
MCV: 95 fL (ref 79–97)
Platelets: 209 x10E3/uL (ref 150–450)
RBC: 3.64 x10E6/uL — ABNORMAL LOW (ref 3.77–5.28)
RDW: 13.3 % (ref 11.7–15.4)
WBC: 7.3 x10E3/uL (ref 3.4–10.8)

## 2024-06-21 LAB — BASIC METABOLIC PANEL WITH GFR
BUN/Creatinine Ratio: 24 (ref 12–28)
BUN: 22 mg/dL (ref 8–27)
CO2: 21 mmol/L (ref 20–29)
Calcium: 10.5 mg/dL — ABNORMAL HIGH (ref 8.7–10.3)
Chloride: 102 mmol/L (ref 96–106)
Creatinine, Ser: 0.93 mg/dL (ref 0.57–1.00)
Glucose: 184 mg/dL — ABNORMAL HIGH (ref 70–99)
Potassium: 4.1 mmol/L (ref 3.5–5.2)
Sodium: 139 mmol/L (ref 134–144)
eGFR: 63 mL/min/1.73 (ref >59)

## 2024-06-21 LAB — BRAIN NATRIURETIC PEPTIDE: BNP: 248.5 pg/mL — ABNORMAL HIGH (ref 0.0–100.0)

## 2024-06-22 ENCOUNTER — Telehealth: Payer: Self-pay | Admitting: Internal Medicine

## 2024-06-22 ENCOUNTER — Other Ambulatory Visit: Payer: Self-pay

## 2024-06-22 ENCOUNTER — Ambulatory Visit (HOSPITAL_COMMUNITY)
Admission: RE | Admit: 2024-06-22 | Discharge: 2024-06-22 | Disposition: A | Discharge status: Home / Self Care | Source: Ambulatory Visit | Attending: Cardiology | Admitting: Cardiology

## 2024-06-22 DIAGNOSIS — I2510 Atherosclerotic heart disease of native coronary artery without angina pectoris: Secondary | ICD-10-CM | POA: Diagnosis not present

## 2024-06-22 LAB — MYOCARDIAL PERFUSION IMAGING
Base ST Depression (mm): 0 mm
LV dias vol: 116 mL (ref 46–106)
LV sys vol: 40 mL (ref 3.8–5.2)
Nuc Stress EF: 66 %
Peak HR: 68 {beats}/min
Rest HR: 59 {beats}/min
Rest Nuclear Isotope Dose: 10 mCi
SDS: 5
SRS: 10
SSS: 15
ST Depression (mm): 0 mm
TID: 1.08

## 2024-06-22 MED ORDER — REGADENOSON 0.4 MG/5ML IV SOLN
0.4000 mg | Freq: Once | INTRAVENOUS | Status: AC
  Administered 2024-06-22: 0.4 mg via INTRAVENOUS

## 2024-06-22 MED ORDER — DEXCOM G6 SENSOR MISC: 3 refills | Status: DC

## 2024-06-22 MED ORDER — TECHNETIUM TC 99M TETROFOSMIN IV KIT
31.6000 | PACK | Freq: Once | INTRAVENOUS | Status: AC | PRN
  Administered 2024-06-22: 31.6 via INTRAVENOUS

## 2024-06-22 MED ORDER — TECHNETIUM TC 99M TETROFOSMIN IV KIT
10.0000 | PACK | Freq: Once | INTRAVENOUS | Status: AC | PRN
  Administered 2024-06-22: 10 via INTRAVENOUS

## 2024-06-22 MED ORDER — REGADENOSON 0.4 MG/5ML IV SOLN
INTRAVENOUS | Status: AC
  Filled 2024-06-22: qty 5

## 2024-06-22 NOTE — Telephone Encounter (Signed)
 MEDICATION: Continuous Glucose Sensor (DEXCOM G6 SENSOR) MISC   [id number] [place] [place] - [address] AT [facility] OF [address] (Ph: [phone])    HAS THE PATIENT CONTACTED THEIR PHARMACY?  Yes  LAST REFILL:  @@LASTREFILL @  IS THIS A 90 DAY SUPPLY : Yes  IS PATIENT OUT OF MEDICATION: Yes IF NOT; HOW MUCH IS LEFT:   LAST APPOINTMENT DATE: @6 /24/2025  NEXT APPOINTMENT DATE:@10 /24/2025  DO WE HAVE YOUR PERMISSION TO LEAVE A DETAILED MESSAGE?: Yes  OTHER COMMENTS:    **Let patient know to contact pharmacy at the end of the day to make sure medication is ready. **  ** Please notify patient to allow 48-72 hours to process**  **Encourage patient to contact the pharmacy for refills or they can request refills through [facility]**

## 2024-06-22 NOTE — Telephone Encounter (Signed)
 Requested sent via fax from [facility]

## 2024-06-22 NOTE — Telephone Encounter (Signed)
 Prescription already sent

## 2024-06-23 ENCOUNTER — Encounter: Payer: Self-pay | Admitting: Family Medicine

## 2024-06-23 ENCOUNTER — Ambulatory Visit (INDEPENDENT_AMBULATORY_CARE_PROVIDER_SITE_OTHER): Admitting: Family Medicine

## 2024-06-23 ENCOUNTER — Ambulatory Visit (HOSPITAL_BASED_OUTPATIENT_CLINIC_OR_DEPARTMENT_OTHER)
Admission: RE | Admit: 2024-06-23 | Discharge: 2024-06-23 | Disposition: A | Discharge status: Home / Self Care | Source: Ambulatory Visit | Attending: Family Medicine | Admitting: Family Medicine

## 2024-06-23 VITALS — BP 160/70 | HR 77 | Temp 97.4°F | Resp 18 | Ht 65.0 in | Wt 190.6 lb

## 2024-06-23 DIAGNOSIS — R140 Abdominal distension (gaseous): Secondary | ICD-10-CM | POA: Diagnosis not present

## 2024-06-23 DIAGNOSIS — H9193 Unspecified hearing loss, bilateral: Secondary | ICD-10-CM

## 2024-06-23 DIAGNOSIS — F418 Other specified anxiety disorders: Secondary | ICD-10-CM | POA: Diagnosis not present

## 2024-06-23 DIAGNOSIS — N838 Other noninflammatory disorders of ovary, fallopian tube and broad ligament: Secondary | ICD-10-CM | POA: Diagnosis not present

## 2024-06-23 DIAGNOSIS — D259 Leiomyoma of uterus, unspecified: Secondary | ICD-10-CM | POA: Diagnosis not present

## 2024-06-23 MED ORDER — FLUOXETINE HCL 20 MG PO CAPS: ORAL_CAPSULE | ORAL | 3 refills | Status: DC

## 2024-06-23 MED ORDER — OMEPRAZOLE 20 MG PO CPDR: 20.0000 mg | DELAYED_RELEASE_CAPSULE | Freq: Every day | ORAL | 3 refills | Status: DC

## 2024-06-23 MED ORDER — ALPRAZOLAM 0.5 MG PO TABS: 0.5000 mg | ORAL_TABLET | Freq: Two times a day (BID) | ORAL | 1 refills | Status: AC | PRN

## 2024-06-23 NOTE — Progress Notes (Signed)
 +  Subjective:    Patient ID: [name], female    DOB: January 19, 1946, 78 y.o.   MRN: [id number]  Chief Complaint  Patient presents with   Memory Loss   Bloated    Pt states having bloating, nausea and black stools, sxs started about 2 weeks ago.     HPI Patient is in today for c/o bloating and memory loss.  Discussed the use of AI scribe software for clinical note transcription with the patient, who gave verbal consent to proceed.  History of Present Illness [name] is a 78 year old female who presents with bloating, shortness of breath, and black stools.  She experiences bloating primarily at night, with one side of her abdomen appearing larger than the other. Eating exacerbates the bloating, and she experiences gas. The bloating is persistent and sometimes accompanied by tenderness. These symptoms were not present during her last CT scan a month ago.  She has been experiencing shortness of breath for the past two weeks, a symptom that was not present during her previous gastroenterology visit.  She reports having black stools but denies seeing any blood in the toilet. She has been taking Pepto Bismol. She does not report diarrhea but mentions an upset stomach that does not resolve.  She experiences headaches, mostly sinus-related, which are aggravated by sun exposure. She tries to wear sunglasses to mitigate this.  She has been dealing with hearing issues and a feeling of clogged ears for a while. She uses allergy medications including Allegra and Flonase . Both ears are bothering her, and she notes a buildup of wax.  Her blood sugar levels have been fluctuating, described as 'up and down'.  She mentions waking up with a buildup of phlegm in the morning.    Past Medical History:  Diagnosis Date   Blockage of coronary artery of heart (HCC)    Depression    Diabetes mellitus    Hyperlipidemia    Hypertension    Pancreatitis    Restless leg syndrome     Past  Surgical History:  Procedure Laterality Date   CATARACT EXTRACTION, BILATERAL     CORONARY ANGIOPLASTY WITH STENT PLACEMENT     FOOT SURGERY Right    cyst removal   ROTATOR CUFF REPAIR     right   ROTATOR CUFF REPAIR Left    TUBAL LIGATION      Family History  Problem Relation Age of Onset   Heart attack Father    Heart attack Brother    Diabetes Sister    Cirrhosis Sister    Hypertension Sister    Cancer Mother    Hypertension Mother    Vision loss Mother    Cancer Sister    Sleep apnea Sister    Heart Problems Sister    Diabetes Sister    Gout Sister    Diabetes Son    Heart attack Son    Diabetes Daughter    Goiter Daughter    Diabetes Daughter     Social History   Socioeconomic History   Marital status: Married    Spouse name: Not on file   Number of children: Not on file   Years of education: Not on file   Highest education level: Not on file  Occupational History   Not on file  Tobacco Use   Smoking status: Former    Current packs/day: 0.00    Types: Cigarettes    Quit date: 05/05/2020    Years since  quitting: 4.1   Smokeless tobacco: Never   Tobacco comments:    quit in 2021  Vaping Use   Vaping status: Never Used  Substance and Sexual Activity   Alcohol use: No   Drug use: No   Sexual activity: Not Currently  Other Topics Concern   Not on file  Social History Narrative   Exercise: seldom   Caffeine use: seldom   Social Drivers of Corporate investment banker Strain: Low Risk  (06/09/2024)   Overall Financial Resource Strain (CARDIA)    Difficulty of Paying Living Expenses: Not very hard  Food Insecurity: No Food Insecurity (06/09/2024)   Hunger Vital Sign    Worried About Running Out of Food in the Last Year: Never true    Ran Out of Food in the Last Year: Never true  Transportation Needs: No Transportation Needs (06/09/2024)   PRAPARE - Administrator, Civil Service (Medical): No    Lack of Transportation (Non-Medical): No   Physical Activity: Inactive (06/09/2024)   Exercise Vital Sign    Days of Exercise per Week: 0 days    Minutes of Exercise per Session: 0 min  Stress: No Stress Concern Present (06/09/2024)   [employer] of Occupational Health - Occupational Stress Questionnaire    Feeling of Stress: Not at all  Social Connections: Moderately Integrated (06/09/2024)   Social Connection and Isolation Panel    Frequency of Communication with Friends and Family: More than three times a week    Frequency of Social Gatherings with Friends and Family: More than three times a week    Attends Religious Services: 1 to 4 times per year    Active Member of [employer] or Organizations: No    Attends Banker Meetings: Never    Marital Status: Married  Catering manager Violence: Not At Risk (06/09/2024)   Humiliation, Afraid, Rape, and Kick questionnaire    Fear of Current or Ex-Partner: No    Emotionally Abused: No    Physically Abused: No    Sexually Abused: No    Outpatient Medications Prior to Visit  Medication Sig Dispense Refill   Alcohol Swabs (B-D SINGLE USE SWABS REGULAR) PADS USE AS DIRECTED 400 each 1   alendronate  (FOSAMAX ) 70 MG tablet TAKE ONE TABLET BY MOUTH ONCE WEEKLY ON WEDNESDAY 12 tablet 3   amLODipine  (NORVASC ) 10 MG tablet Take 1 tablet (10 mg total) by mouth daily. 90 tablet 3   aspirin  EC 81 MG tablet Take 81 mg by mouth daily.     atorvastatin  (LIPITOR) 80 MG tablet Take 1 tablet (80 mg total) by mouth every evening. 90 tablet 1   buPROPion  (WELLBUTRIN  XL) 300 MG 24 hr tablet Take 1 tablet (300 mg total) by mouth daily. 90 tablet 3   Carboxymethylcellulose Sodium (THERATEARS OP) Place 1 drop into both eyes daily as needed (dry eyes).     Ciclopirox  1 % shampoo Massage into scalp 2x a week for 4 weeks 120 mL 0   Continuous Glucose Receiver (DEXCOM G6 RECEIVER) DEVI Use to monitor glucose 1 each 0   Continuous Glucose Sensor (DEXCOM G6 SENSOR) MISC Change sensor every10 days 9 each  3   Continuous Glucose Sensor (DEXCOM G7 SENSOR) MISC 1 Device by Does not apply route as directed. Every 10 days 9 each 3   Continuous Glucose Transmitter (DEXCOM G6 TRANSMITTER) MISC REPLACE TRANSMITTER EVERY 90 DAYS 1 each 3   esomeprazole  (NEXIUM ) 40 MG capsule Take 1 capsule (40  mg total) by mouth daily. 90 capsule 3   fenofibrate  160 MG tablet Take 1 tablet (160 mg total) by mouth every evening. 90 tablet 3   ferrous sulfate  325 (65 FE) MG tablet Take 325 mg by mouth daily with breakfast.      fexofenadine (ALLEGRA) 180 MG tablet Take 180 mg by mouth daily as needed for allergies or rhinitis.     fluticasone  (FLONASE ) 50 MCG/ACT nasal spray INSTILL 2 SPRAYS IN EACH NOSTRIL DAILY 48 g 1   furosemide  (LASIX ) 20 MG tablet Take 1 tablet (20 mg total) by mouth daily. Take for 3 days then as directed by cardiology 30 tablet 0   glucose blood (CONTOUR NEXT TEST) test strip Use to check blood sugar up to three times daily 100 each 12   glucose blood (TRUE METRIX BLOOD GLUCOSE TEST) test strip USE AS DIRECTED 4 TIMES A DAY 400 strip 1   insulin  aspart (NOVOLOG  FLEXPEN) 100 UNIT/ML FlexPen Max daily 70 units 60 mL 6   Insulin  Disposable Pump (OMNIPOD 5 G7 INTRO, GEN 5,) KIT 1 Device by Does not apply route every other day. 1 kit 0   Insulin  Disposable Pump (OMNIPOD 5 G7 PODS, GEN 5,) MISC 1 Device by Does not apply route every 3 (three) days. 45 each 3   Insulin  Pen Needle (COMFORT EZ PEN NEEDLES) 32G X 4 MM MISC 1 Device by Other route in the morning, at noon, in the evening, and at bedtime. as directed 400 each 3   metoprolol  succinate (TOPROL -XL) 50 MG 24 hr tablet TAKE ONE TABLET BY MOUTH ONCE DAILY WITH FOOD 90 tablet 3   Multiple Vitamin (MULTIVITAMIN WITH MINERALS) TABS Take 1 tablet by mouth daily.     Omega-3 Fatty Acids (FISH OIL) 1000 MG CAPS Take 1 capsule by mouth in the morning and at bedtime.     pioglitazone  (ACTOS ) 30 MG tablet Take 1 tablet (30 mg total) by mouth daily. 90 tablet 3    valsartan  (DIOVAN ) 320 MG tablet Take 1 tablet (320 mg total) by mouth every morning. 90 tablet 3   Vitamin D , Ergocalciferol , (DRISDOL ) 1.25 MG (50000 UNIT) CAPS capsule Take 1 capsule (50,000 Units total) by mouth every 7 (seven) days. 12 capsule 1   vitamin E  400 UNIT capsule Take 400 Units by mouth daily.     ALPRAZolam  (XANAX ) 0.25 MG tablet Take 1 tablet by mouth three times a day if needed 60 tablet 1   FLUoxetine  (PROZAC ) 40 MG capsule Take 1 capsule (40 mg total) by mouth daily. 90 capsule 3   No facility-administered medications prior to visit.    Allergies  Allergen Reactions   Metformin And Related Other (See Comments)    Sick on stomach, sweaty, throw up, pain   Levemir [Insulin  Detemir] Hives and Swelling   Clindamycin Nausea And Vomiting   Crestor  [Rosuvastatin  Calcium ] Palpitations    Review of Systems  Constitutional:  Negative for fever and malaise/fatigue.  HENT:  Negative for congestion.   Eyes:  Negative for blurred vision.  Respiratory:  Negative for shortness of breath.   Cardiovascular:  Negative for chest pain, palpitations and leg swelling.  Gastrointestinal:  Positive for abdominal pain. Negative for blood in stool, melena and nausea.  Genitourinary:  Negative for dysuria and frequency.  Musculoskeletal:  Negative for falls.  Skin:  Negative for rash.  Neurological:  Negative for dizziness, loss of consciousness and headaches.  Endo/Heme/Allergies:  Negative for environmental allergies.  Psychiatric/Behavioral:  Positive  for memory loss. Negative for depression, hallucinations, substance abuse and suicidal ideas. The patient is nervous/anxious. The patient does not have insomnia.        Objective:    Physical Exam Vitals and nursing note reviewed.  Constitutional:      General: She is not in acute distress.    Appearance: Normal appearance. She is well-developed.  HENT:     Head: Normocephalic and atraumatic.     Right Ear: Tympanic membrane, ear  canal and external ear normal. There is no impacted cerumen.     Left Ear: Tympanic membrane, ear canal and external ear normal. There is no impacted cerumen.     Nose: Nose normal.     Mouth/Throat:     Mouth: Mucous membranes are moist.     Pharynx: Oropharynx is clear. No oropharyngeal exudate or posterior oropharyngeal erythema.  Eyes:     General: No scleral icterus.       Right eye: No discharge.        Left eye: No discharge.     Conjunctiva/sclera: Conjunctivae normal.     Pupils: Pupils are equal, round, and reactive to light.  Neck:     Thyroid : No thyromegaly or thyroid  tenderness.     Vascular: No JVD.  Cardiovascular:     Rate and Rhythm: Normal rate and regular rhythm.     Heart sounds: Normal heart sounds. No murmur heard. Pulmonary:     Effort: Pulmonary effort is normal. No respiratory distress.     Breath sounds: Normal breath sounds.  Abdominal:     General: Bowel sounds are normal. There is distension.     Palpations: Abdomen is soft. There is no mass.     Tenderness: There is abdominal tenderness. There is no guarding or rebound.  Genitourinary:    Vagina: Normal.  Musculoskeletal:        General: Normal range of motion.     Cervical back: Normal range of motion and neck supple.     Right lower leg: No edema.     Left lower leg: No edema.  Lymphadenopathy:     Cervical: No cervical adenopathy.  Skin:    General: Skin is warm and dry.     Findings: No erythema or rash.  Neurological:     Mental Status: She is alert and oriented to person, place, and time.     Cranial Nerves: No cranial nerve deficit.     Deep Tendon Reflexes: Reflexes are normal and symmetric.  Psychiatric:        Mood and Affect: Mood normal.        Behavior: Behavior normal.        Thought Content: Thought content normal.        Judgment: Judgment normal.     BP (!) 160/70 (BP Location: Right Arm, Patient Position: Sitting, Cuff Size: Normal)   Pulse 77   Temp (!) 97.4 F (36.3  C) (Oral)   Resp 18   Ht 5' 5 (1.651 m)   Wt 190 lb 9.6 oz (86.5 kg)   SpO2 95%   BMI 31.72 kg/m  Wt Readings from Last 3 Encounters:  06/23/24 190 lb 9.6 oz (86.5 kg)  06/19/24 192 lb 9.6 oz (87.4 kg)  06/09/24 169 lb (76.7 kg)    Diabetic Foot Exam - Simple   No data filed    Lab Results  Component Value Date   WBC 7.3 06/19/2024   HGB 10.8 (L) 06/19/2024   HCT  34.4 06/19/2024   PLT 209 06/19/2024   GLUCOSE 184 (H) 06/19/2024   CHOL 120 07/12/2023   TRIG 163 (H) 07/12/2023   HDL 36 (L) 07/12/2023   LDLDIRECT 53.0 04/24/2021   LDLCALC 60 07/12/2023   ALT 21 05/07/2024   AST 24 05/07/2024   NA 139 06/19/2024   K 4.1 06/19/2024   CL 102 06/19/2024   CREATININE 0.93 06/19/2024   BUN 22 06/19/2024   CO2 21 06/19/2024   TSH 0.91 03/09/2024   INR 1.1 03/03/2009   HGBA1C 8.2 (A) 03/31/2024   MICROALBUR 9.9 07/12/2023    Lab Results  Component Value Date   TSH 0.91 03/09/2024   Lab Results  Component Value Date   WBC 7.3 06/19/2024   HGB 10.8 (L) 06/19/2024   HCT 34.4 06/19/2024   MCV 95 06/19/2024   PLT 209 06/19/2024   Lab Results  Component Value Date   NA 139 06/19/2024   K 4.1 06/19/2024   CO2 21 06/19/2024   GLUCOSE 184 (H) 06/19/2024   BUN 22 06/19/2024   CREATININE 0.93 06/19/2024   BILITOT 0.6 05/07/2024   ALKPHOS 35 (L) 05/07/2024   AST 24 05/07/2024   ALT 21 05/07/2024   PROT 6.5 05/07/2024   ALBUMIN 4.1 05/07/2024   CALCIUM  10.5 (H) 06/19/2024   ANIONGAP 13 09/23/2020   EGFR 63 06/19/2024   GFR 41.34 (L) 05/07/2024   Lab Results  Component Value Date   CHOL 120 07/12/2023   Lab Results  Component Value Date   HDL 36 (L) 07/12/2023   Lab Results  Component Value Date   LDLCALC 60 07/12/2023   Lab Results  Component Value Date   TRIG 163 (H) 07/12/2023   Lab Results  Component Value Date   CHOLHDL 3.3 07/12/2023   Lab Results  Component Value Date   HGBA1C 8.2 (A) 03/31/2024       Assessment & Plan:  Abdominal  bloating -     Omeprazole ; Take 1 capsule (20 mg total) by mouth daily.  Dispense: 90 capsule; Refill: 3 -     CBC with Differential/Platelet -     Comprehensive metabolic panel with GFR -     Amylase -     Lipase -     US  PELVIC COMPLETE WITH TRANSVAGINAL; Future -     POCT Urinalysis Dipstick (Automated)  Bilateral hearing loss, unspecified hearing loss type -     Ambulatory referral to ENT  Depression with anxiety -     ALPRAZolam ; Take 1 tablet (0.5 mg total) by mouth 2 (two) times daily as needed for anxiety.  Dispense: 60 tablet; Refill: 1 -     FLUoxetine  HCl; 3 po every day  Dispense: 270 capsule; Refill: 3  Assessment and Plan Assessment & Plan Abdominal bloating and distension   She has experienced abdominal bloating and distension for two to three weeks, primarily at night, with tenderness and gas, worsened by eating. The left side is larger than the right. No blood was found on rectal examination. Black stools are likely due to Pepto Bismol.  Shortness of breath   She reports shortness of breath for approximately two weeks.  Hearing loss with cerumen impaction   She has hearing loss with a sensation of clogged ears due to cerumen impaction in the right ear. A nurse will attempt cerumen removal from the right ear.  Headache, likely sinus-related   She experiences headaches likely related to sinus issues, exacerbated by sun exposure. She  uses Allegra and Flonase  for allergy management. Continue Allegra and Flonase . Advise wearing sunglasses to reduce sun-induced headaches.  Type 2 diabetes mellitus   Her blood sugar levels are fluctuating.    Emmer Lillibridge R Lowne Chase, DO

## 2024-06-24 ENCOUNTER — Other Ambulatory Visit: Payer: Self-pay | Admitting: Family Medicine

## 2024-06-24 ENCOUNTER — Ambulatory Visit: Payer: Self-pay | Admitting: Family Medicine

## 2024-06-24 DIAGNOSIS — R1900 Intra-abdominal and pelvic swelling, mass and lump, unspecified site: Secondary | ICD-10-CM

## 2024-06-24 LAB — COMPREHENSIVE METABOLIC PANEL WITH GFR
ALT: 16 U/L (ref 0–35)
AST: 18 U/L (ref 0–37)
Albumin: 4.1 g/dL (ref 3.5–5.2)
Alkaline Phosphatase: 37 U/L — ABNORMAL LOW (ref 39–117)
BUN: 24 mg/dL — ABNORMAL HIGH (ref 6–23)
CO2: 28 meq/L (ref 19–32)
Calcium: 10.2 mg/dL (ref 8.4–10.5)
Chloride: 103 meq/L (ref 96–112)
Creatinine, Ser: 1.04 mg/dL (ref 0.40–1.20)
GFR: 51.5 mL/min — ABNORMAL LOW (ref >60.00)
Glucose, Bld: 290 mg/dL — ABNORMAL HIGH (ref 70–99)
Potassium: 3.8 meq/L (ref 3.5–5.1)
Sodium: 138 meq/L (ref 135–145)
Total Bilirubin: 0.5 mg/dL (ref 0.2–1.2)
Total Protein: 6.3 g/dL (ref 6.0–8.3)

## 2024-06-24 LAB — CBC WITH DIFFERENTIAL/PLATELET
Basophils Absolute: 0.1 K/uL (ref 0.0–0.1)
Basophils Relative: 1 % (ref 0.0–3.0)
Eosinophils Absolute: 0.1 K/uL (ref 0.0–0.7)
Eosinophils Relative: 0.9 % (ref 0.0–5.0)
HCT: 32.5 % — ABNORMAL LOW (ref 36.0–46.0)
Hemoglobin: 10.8 g/dL — ABNORMAL LOW (ref 12.0–15.0)
Lymphocytes Relative: 38.2 % (ref 12.0–46.0)
Lymphs Abs: 2.3 K/uL (ref 0.7–4.0)
MCHC: 33.1 g/dL (ref 30.0–36.0)
MCV: 90.1 fl (ref 78.0–100.0)
Monocytes Absolute: 0.7 K/uL (ref 0.1–1.0)
Monocytes Relative: 12 % (ref 3.0–12.0)
Neutro Abs: 2.8 K/uL (ref 1.4–7.7)
Neutrophils Relative %: 47.9 % (ref 43.0–77.0)
Platelets: 207 K/uL (ref 150.0–400.0)
RBC: 3.61 Mil/uL — ABNORMAL LOW (ref 3.87–5.11)
RDW: 14.4 % (ref 11.5–15.5)
WBC: 5.9 K/uL (ref 4.0–10.5)

## 2024-06-24 LAB — AMYLASE: Amylase: 27 U/L (ref 27–131)

## 2024-06-24 LAB — LIPASE: Lipase: 31 U/L (ref 11.0–59.0)

## 2024-06-24 MED ORDER — FUROSEMIDE 20 MG PO TABS: 20.0000 mg | ORAL_TABLET | Freq: Every day | ORAL | 0 refills | Status: DC

## 2024-06-24 NOTE — Telephone Encounter (Signed)
 The patient has been notified of the result and verbalized understanding.  All questions (if any) were answered.  Pt aware to continue lasix  20 mg po daily due to volume overload and we will recheck her BMET in 2 weeks for monitoring.  Pt aware to mark down on her calendar to come back in for lab work in 2 weeks (on 10/1) at [place] on 3rd floor [address].   Confirmed the pharmacy of choice with the pt.   Advised the pt to reach out to her PCP for new finding of anemia and for further treatment of this.  Pt states she saw her PCP yesterday, but will place a call to her this week for further treatment of new found anemia on recent lab results.  Pt aware that Reche Finder, NP already routed these results to her PCP on file.   Advised the pt to keep her echo as scheduled on 10/10.   Pt verbalized understanding and agrees with this plan.

## 2024-06-24 NOTE — Telephone Encounter (Signed)
-----   Message from Reche GORMAN Finder sent at 06/21/2024  4:00 PM EDT ----- Normal kidney function. BNP with volume overload. CBC with new anemia.   Recommend continue Lasix  20mg  daily due to volume overload (will need updated Rx). Repeat BMET in 2 weeks for monitoring.   Fatigue could be related to anemia. Recommend further follow up with primary care provider regarding anemia.  ----- Message ----- From: Interface, [facility] Lab Results In Sent: 06/21/2024   3:35 PM EDT To: Reche GORMAN Finder, NP

## 2024-06-25 ENCOUNTER — Telehealth: Payer: Self-pay

## 2024-06-25 ENCOUNTER — Telehealth: Payer: Self-pay | Admitting: Cardiology

## 2024-06-25 ENCOUNTER — Other Ambulatory Visit: Payer: Self-pay | Admitting: Family Medicine

## 2024-06-25 DIAGNOSIS — D509 Iron deficiency anemia, unspecified: Secondary | ICD-10-CM

## 2024-06-25 DIAGNOSIS — D649 Anemia, unspecified: Secondary | ICD-10-CM

## 2024-06-25 NOTE — Telephone Encounter (Signed)
 Called patient and let her know that the lab results were routed to the patient's PCP for any recommendations.

## 2024-06-25 NOTE — Telephone Encounter (Signed)
  Patient states that Reche Finder was supposed to send a letter over to her PCP, Dr Antonio Meth to let her know that the patient is anemic and that the PCP would need to give medication for treatment. She is following up on this. Please advise.

## 2024-06-25 NOTE — Telephone Encounter (Signed)
 Copied from CRM [id number]. Topic: Clinical - Medication Question >> Jun 25, 2024  1:20 PM Antwanette L wrote: Reason for CRM: The patient is calling to follow up regarding a prescription that was supposed to be faxed by Dr. Acey Finder (Cardiology) from [facility] to Dr. Antonio Meth. The prescription pertains to treatment for the patient's anemia.

## 2024-06-25 NOTE — Telephone Encounter (Signed)
 Lab results were faxed to PCP yesterday.  Resent today via EPIC chart routing.

## 2024-06-26 MED ORDER — IRON (FERROUS SULFATE) 325 (65 FE) MG PO TABS: 325.0000 mg | ORAL_TABLET | Freq: Every day | ORAL | 2 refills | Status: AC

## 2024-06-26 NOTE — Addendum Note (Signed)
 Addended by: DORLENE CHIQUITA RAMAN on: 06/26/2024 09:22 AM   Modules accepted: Orders

## 2024-06-26 NOTE — Telephone Encounter (Signed)
Pt called, lab appt made

## 2024-06-26 NOTE — Addendum Note (Signed)
 Addended by: DORLENE CHIQUITA RAMAN on: 06/26/2024 09:19 AM   Modules accepted: Orders

## 2024-06-29 ENCOUNTER — Ambulatory Visit (HOSPITAL_BASED_OUTPATIENT_CLINIC_OR_DEPARTMENT_OTHER): Payer: Self-pay | Admitting: Family

## 2024-06-29 DIAGNOSIS — E049 Nontoxic goiter, unspecified: Secondary | ICD-10-CM

## 2024-06-29 NOTE — Telephone Encounter (Signed)
 The patient has been notified of the result and verbalized understanding.  All questions (if any) were answered.  Pt aware we will place the order for her to get a Thyroid  US  done for enlarged left thyroid  lobe with calcifications noted on myoview .  Pt aware our scheduling team will reach out to her soon to arrange this appointment.   Pt request when they call to arrange this appt, to call her anytime after 10 am, for she doesn't wake up until then.  Will endorse in a staff message to scheduling.   Pt verbalized understanding and agrees with this plan.

## 2024-06-29 NOTE — Telephone Encounter (Signed)
-----   Message from Reche GORMAN Finder sent at 06/29/2024  7:49 AM EDT ----- Myoview  with inadvertent finding of enlarged left thyroid  lobe with calcifications. Recommend thyroid  ultrasound to ensure stable from previous ultrasound 02/2020. ----- Message ----- From: Interface, Rad Results In Sent: 06/27/2024   8:26 PM EDT To: Reche GORMAN Finder, NP

## 2024-06-30 ENCOUNTER — Telehealth: Payer: Self-pay | Admitting: Gastroenterology

## 2024-06-30 NOTE — Telephone Encounter (Signed)
 Left message for patient to call back

## 2024-06-30 NOTE — Telephone Encounter (Signed)
 Inbound call om patient requesting a different prescription than Protonix . Please advise.

## 2024-07-01 NOTE — Telephone Encounter (Signed)
 Attempted to reach patient .  LVM to call us  back. Not seeing Protonix  in her med profile, only Nexium  and Prilosec.

## 2024-07-02 NOTE — Telephone Encounter (Signed)
 Spoke with patient & she feels that her bloating has not improved, and requesting medication changed. She's currently on nexium  40 mg daily & omeprazole  20 mg daily. Last seen in August with Deanna. She was advised at that time to try IBGard OTC which she hasn't tried yet. Advised she try this & if symptoms are still not improving then to call us  back.

## 2024-07-08 DIAGNOSIS — I10 Essential (primary) hypertension: Secondary | ICD-10-CM | POA: Diagnosis not present

## 2024-07-08 DIAGNOSIS — R5383 Other fatigue: Secondary | ICD-10-CM | POA: Diagnosis not present

## 2024-07-08 DIAGNOSIS — R7989 Other specified abnormal findings of blood chemistry: Secondary | ICD-10-CM | POA: Diagnosis not present

## 2024-07-08 DIAGNOSIS — Z79899 Other long term (current) drug therapy: Secondary | ICD-10-CM | POA: Diagnosis not present

## 2024-07-08 LAB — BASIC METABOLIC PANEL WITH GFR
BUN/Creatinine Ratio: 16 (ref 12–28)
BUN: 18 mg/dL (ref 8–27)
CO2: 21 mmol/L (ref 20–29)
Calcium: 10.3 mg/dL (ref 8.7–10.3)
Chloride: 106 mmol/L (ref 96–106)
Creatinine, Ser: 1.11 mg/dL — ABNORMAL HIGH (ref 0.57–1.00)
Glucose: 61 mg/dL — ABNORMAL LOW (ref 70–99)
Potassium: 3.7 mmol/L (ref 3.5–5.2)
Sodium: 142 mmol/L (ref 134–144)
eGFR: 51 mL/min/1.73 — ABNORMAL LOW (ref >59)

## 2024-07-08 NOTE — Progress Notes (Signed)
 [name]                                          MRN: [id number]   07/08/2024   The [facility] Quality Team Specialist reviewed this patient medical record for the purposes of chart review for care gap closure. The following were reviewed: chart review for care gap closure-kidney health evaluation for diabetes:eGFR  and uACR. Patient has lab visit 10/21    [facility] Quality Team

## 2024-07-11 ENCOUNTER — Other Ambulatory Visit: Payer: Self-pay

## 2024-07-11 ENCOUNTER — Encounter (HOSPITAL_BASED_OUTPATIENT_CLINIC_OR_DEPARTMENT_OTHER): Payer: Self-pay

## 2024-07-11 ENCOUNTER — Emergency Department (HOSPITAL_BASED_OUTPATIENT_CLINIC_OR_DEPARTMENT_OTHER)
Admission: EM | Admit: 2024-07-11 | Discharge: 2024-07-11 | Disposition: A | Discharge status: Home / Self Care | Attending: Emergency Medicine | Admitting: Emergency Medicine

## 2024-07-11 DIAGNOSIS — S90862A Insect bite (nonvenomous), left foot, initial encounter: Secondary | ICD-10-CM | POA: Diagnosis not present

## 2024-07-11 DIAGNOSIS — M79672 Pain in left foot: Secondary | ICD-10-CM | POA: Diagnosis present

## 2024-07-11 DIAGNOSIS — Z79899 Other long term (current) drug therapy: Secondary | ICD-10-CM | POA: Insufficient documentation

## 2024-07-11 DIAGNOSIS — Z794 Long term (current) use of insulin: Secondary | ICD-10-CM | POA: Insufficient documentation

## 2024-07-11 DIAGNOSIS — T63481A Toxic effect of venom of other arthropod, accidental (unintentional), initial encounter: Secondary | ICD-10-CM | POA: Diagnosis not present

## 2024-07-11 DIAGNOSIS — I10 Essential (primary) hypertension: Secondary | ICD-10-CM | POA: Insufficient documentation

## 2024-07-11 DIAGNOSIS — T63441A Toxic effect of venom of bees, accidental (unintentional), initial encounter: Secondary | ICD-10-CM | POA: Diagnosis not present

## 2024-07-11 DIAGNOSIS — Z7982 Long term (current) use of aspirin: Secondary | ICD-10-CM | POA: Diagnosis not present

## 2024-07-11 DIAGNOSIS — E119 Type 2 diabetes mellitus without complications: Secondary | ICD-10-CM | POA: Diagnosis not present

## 2024-07-11 MED ORDER — CEPHALEXIN 500 MG PO CAPS: 500.0000 mg | ORAL_CAPSULE | Freq: Two times a day (BID) | ORAL | 0 refills | Status: DC

## 2024-07-11 MED ORDER — PREDNISONE 20 MG PO TABS: 40.0000 mg | ORAL_TABLET | Freq: Every day | ORAL | 0 refills | Status: DC

## 2024-07-11 NOTE — ED Notes (Signed)
 Reviewed AVS/discharge instruction with patient. Time allotted for and all questions answered. Patient is agreeable for d/c and escorted to ed exit by staff.

## 2024-07-11 NOTE — ED Provider Notes (Signed)
 [place] [facility]PARTMENT AT [facility] Provider Note   CSN: [id number] Arrival date & time: 07/11/24  1257     Patient presents with: Foot Pain (left) and Insect Bite   [name] is a 78 y.o. female.   Patient is a 78 year old female with a history of diabetes, hypertension, hyperlipidemia who is presenting today due to the complaint of pain and swelling in her left foot that started on Thursday after she was bit by multiple ants.  As soon as the bites occurred she has been having a burning itching pain in her foot with swelling.  She feels that the swelling is a little bit better now but she is having ongoing symptoms and noticed a few pustules as well.  She is not having any systemic symptoms at this time.  No weeping or wounds on the area.  They did not bite her anywhere else.  The history is provided by the patient.  Foot Pain       Prior to Admission medications   Medication Sig Start Date End Date Taking? Authorizing Provider  cephALEXin (KEFLEX) 500 MG capsule Take 1 capsule (500 mg total) by mouth 2 (two) times daily. 07/11/24  Yes Roseanne Juenger, Benton, MD  predniSONE  (DELTASONE ) 20 MG tablet Take 2 tablets (40 mg total) by mouth daily. 07/11/24  Yes Doretha Benton, MD  Alcohol Swabs (B-D SINGLE USE SWABS REGULAR) PADS USE AS DIRECTED 03/15/20   Antonio Meth, Yvonne R, DO  alendronate  (FOSAMAX ) 70 MG tablet TAKE ONE TABLET BY MOUTH ONCE WEEKLY ON WEDNESDAY 05/05/24   Lowne Chase, Yvonne R, DO  ALPRAZolam  (XANAX ) 0.5 MG tablet Take 1 tablet (0.5 mg total) by mouth 2 (two) times daily as needed for anxiety. 06/23/24   Antonio Meth Jamee JONELLE, DO  amLODipine  (NORVASC ) 10 MG tablet Take 1 tablet (10 mg total) by mouth daily. 05/05/24   Antonio Meth Jamee JONELLE, DO  aspirin  EC 81 MG tablet Take 81 mg by mouth daily.    [provider]  atorvastatin  (LIPITOR) 80 MG tablet Take 1 tablet (80 mg total) by mouth every evening. 03/04/24   Antonio Meth, Jamee JONELLE, DO   buPROPion  (WELLBUTRIN  XL) 300 MG 24 hr tablet Take 1 tablet (300 mg total) by mouth daily. 07/12/23   Lowne Chase, Yvonne R, DO  Carboxymethylcellulose Sodium (THERATEARS OP) Place 1 drop into both eyes daily as needed (dry eyes).    [provider]  Ciclopirox  1 % shampoo Massage into scalp 2x a week for 4 weeks 03/09/24   Antonio Meth, Jamee JONELLE, DO  Continuous Glucose Receiver (DEXCOM G6 RECEIVER) DEVI Use to monitor glucose 01/09/24   Shamleffer, Donell Cardinal, MD  Continuous Glucose Sensor (DEXCOM G6 SENSOR) MISC Change sensor every10 days 06/22/24   Shamleffer, Donell Cardinal, MD  Continuous Glucose Sensor (DEXCOM G7 SENSOR) MISC 1 Device by Does not apply route as directed. Every 10 days 03/31/24   Shamleffer, Donell Cardinal, MD  Continuous Glucose Transmitter (DEXCOM G6 TRANSMITTER) MISC REPLACE TRANSMITTER EVERY 90 DAYS 01/09/24   Shamleffer, Ibtehal Jaralla, MD  esomeprazole  (NEXIUM ) 40 MG capsule Take 1 capsule (40 mg total) by mouth daily. 03/11/24   Antonio Meth Jamee JONELLE, DO  fenofibrate  160 MG tablet Take 1 tablet (160 mg total) by mouth every evening. 05/05/24   Antonio Meth, Yvonne R, DO  ferrous sulfate  325 (65 FE) MG tablet Take 325 mg by mouth daily with breakfast.     [provider]  fexofenadine (ALLEGRA) 180  MG tablet Take 180 mg by mouth daily as needed for allergies or rhinitis.    [provider]  FLUoxetine  (PROZAC ) 20 MG capsule 3 po every day 06/23/24   Antonio Meth, Yvonne R, DO  fluticasone  (FLONASE ) 50 MCG/ACT nasal spray INSTILL 2 SPRAYS IN EACH NOSTRIL DAILY 01/14/23   Antonio Meth, Jamee SAUNDERS, DO  furosemide  (LASIX ) 20 MG tablet Take 1 tablet (20 mg total) by mouth daily. 06/24/24   Vannie Reche RAMAN, NP  glucose blood (CONTOUR NEXT TEST) test strip Use to check blood sugar up to three times daily 04/29/24   Shamleffer, Ibtehal Jaralla, MD  glucose blood (TRUE METRIX BLOOD GLUCOSE TEST) test strip USE AS DIRECTED 4 TIMES A DAY 10/27/19   Antonio Meth Jamee SAUNDERS, DO  insulin  aspart (NOVOLOG  FLEXPEN) 100 UNIT/ML FlexPen Max daily 70 units 05/21/23   Shamleffer, Ibtehal Jaralla, MD  Insulin  Disposable Pump (OMNIPOD 5 G7 INTRO, GEN 5,) KIT 1 Device by Does not apply route every other day. 03/31/24   Shamleffer, Donell Cardinal, MD  Insulin  Disposable Pump (OMNIPOD 5 G7 PODS, GEN 5,) MISC 1 Device by Does not apply route every 3 (three) days. 03/31/24   Shamleffer, Ibtehal Jaralla, MD  Insulin  Pen Needle (COMFORT EZ PEN NEEDLES) 32G X 4 MM MISC 1 Device by Other route in the morning, at noon, in the evening, and at bedtime. as directed 05/21/23   Shamleffer, Donell Cardinal, MD  Iron , Ferrous Sulfate , 325 (65 Fe) MG TABS Take 325 mg by mouth daily. 06/26/24   Antonio Meth Jamee R, DO  metoprolol  succinate (TOPROL -XL) 50 MG 24 hr tablet TAKE ONE TABLET BY MOUTH ONCE DAILY WITH FOOD 05/05/24   Lowne Chase, Yvonne R, DO  Multiple Vitamin (MULTIVITAMIN WITH MINERALS) TABS Take 1 tablet by mouth daily.    [provider]  Omega-3 Fatty Acids (FISH OIL) 1000 MG CAPS Take 1 capsule by mouth in the morning and at bedtime.    [provider]  omeprazole  (PRILOSEC) 20 MG capsule Take 1 capsule (20 mg total) by mouth daily. 06/23/24   Antonio Meth Jamee SAUNDERS, DO  pioglitazone  (ACTOS ) 30 MG tablet Take 1 tablet (30 mg total) by mouth daily. 11/04/23   Shamleffer, Ibtehal Jaralla, MD  valsartan  (DIOVAN ) 320 MG tablet Take 1 tablet (320 mg total) by mouth every morning. 05/05/24   Antonio Meth, Yvonne R, DO  Vitamin D , Ergocalciferol , (DRISDOL ) 1.25 MG (50000 UNIT) CAPS capsule Take 1 capsule (50,000 Units total) by mouth every 7 (seven) days. 03/11/24   Antonio Meth Jamee SAUNDERS, DO  vitamin E  400 UNIT capsule Take 400 Units by mouth daily.    [provider]    Allergies: Metformin and related, Levemir [insulin  detemir], Clindamycin, and Crestor  [rosuvastatin  calcium ]    Review of Systems  Updated Vital Signs BP (!) 152/69 (BP Location: Right Arm)   Pulse  64   Temp (!) 97.5 F (36.4 C)   Resp 18   Ht 5' 5 (1.651 m)   Wt 81.6 kg   SpO2 100%   BMI 29.95 kg/m   Physical Exam Vitals reviewed.  Cardiovascular:     Rate and Rhythm: Normal rate.  Musculoskeletal:        General: Tenderness present.       Feet:  Skin:    General: Skin is warm.  Neurological:     Mental Status: She is alert.     (all labs ordered are listed, but only abnormal results are displayed)  Labs Reviewed - No data to display  EKG: None  Radiology: No results found.   Procedures   Medications Ordered in the ED - No data to display                                  Medical Decision Making Risk Prescription drug management.   Patient presenting today after being bitten by multiple ants now with a localized reaction and a few small pustules.  No significant cellulitis at this time however patient is diabetic.  She does have some swelling as well to the foot but no other areas involved.  Will start a short course of steroids which was discussed with the patient and the effects it would have on her blood sugar and she will adjust her insulin  as needed.  Also patient started on antibiotic to prevent any developing cellulitis.  She was given return precautions.     Final diagnoses:  Local reaction to insect sting, accidental or unintentional, initial encounter    ED Discharge Orders          Ordered    cephALEXin (KEFLEX) 500 MG capsule  2 times daily        07/11/24 1406    predniSONE  (DELTASONE ) 20 MG tablet  Daily        07/11/24 1406               Doretha Folks, MD 07/11/24 1417

## 2024-07-11 NOTE — Discharge Instructions (Signed)
 Try elevating your foot as often as you can and avoid tight fitting shoes.  We are starting a steroid and an antibiotic to prevent infection.  The steroid should help with the swelling, inflammation and pain.  However it will make your sugar go up so you may need to adjust your insulin  while you are taking the steroid.  If it starts getting much worse or redness starts moving up your leg you start developing fevers you need to be seen in the emergency room or by your doctor.

## 2024-07-11 NOTE — ED Triage Notes (Signed)
 Arrives to the ED with complaints of left foot pain related to being bit by several ants two days ago.

## 2024-07-14 ENCOUNTER — Ambulatory Visit (HOSPITAL_BASED_OUTPATIENT_CLINIC_OR_DEPARTMENT_OTHER)

## 2024-07-14 ENCOUNTER — Ambulatory Visit (HOSPITAL_BASED_OUTPATIENT_CLINIC_OR_DEPARTMENT_OTHER)
Admission: RE | Admit: 2024-07-14 | Discharge: 2024-07-14 | Disposition: A | Discharge status: Home / Self Care | Source: Ambulatory Visit | Attending: Family | Admitting: Family

## 2024-07-14 ENCOUNTER — Other Ambulatory Visit (HOSPITAL_BASED_OUTPATIENT_CLINIC_OR_DEPARTMENT_OTHER)

## 2024-07-14 DIAGNOSIS — E042 Nontoxic multinodular goiter: Secondary | ICD-10-CM | POA: Diagnosis not present

## 2024-07-14 DIAGNOSIS — E049 Nontoxic goiter, unspecified: Secondary | ICD-10-CM | POA: Diagnosis not present

## 2024-07-16 ENCOUNTER — Institutional Professional Consult (permissible substitution) (INDEPENDENT_AMBULATORY_CARE_PROVIDER_SITE_OTHER)

## 2024-07-16 ENCOUNTER — Emergency Department (HOSPITAL_COMMUNITY)

## 2024-07-16 ENCOUNTER — Encounter (HOSPITAL_COMMUNITY): Payer: Self-pay

## 2024-07-16 ENCOUNTER — Ambulatory Visit (HOSPITAL_BASED_OUTPATIENT_CLINIC_OR_DEPARTMENT_OTHER): Payer: Self-pay | Admitting: Family

## 2024-07-16 ENCOUNTER — Emergency Department (HOSPITAL_COMMUNITY): Admission: EM | Admit: 2024-07-16 | Discharge: 2024-07-16 | Disposition: A | Discharge status: Home / Self Care

## 2024-07-16 DIAGNOSIS — J9811 Atelectasis: Secondary | ICD-10-CM | POA: Diagnosis not present

## 2024-07-16 DIAGNOSIS — I4510 Unspecified right bundle-branch block: Secondary | ICD-10-CM | POA: Diagnosis not present

## 2024-07-16 DIAGNOSIS — Z7982 Long term (current) use of aspirin: Secondary | ICD-10-CM | POA: Insufficient documentation

## 2024-07-16 DIAGNOSIS — R531 Weakness: Secondary | ICD-10-CM | POA: Diagnosis not present

## 2024-07-16 DIAGNOSIS — M4316 Spondylolisthesis, lumbar region: Secondary | ICD-10-CM | POA: Diagnosis not present

## 2024-07-16 DIAGNOSIS — E119 Type 2 diabetes mellitus without complications: Secondary | ICD-10-CM | POA: Insufficient documentation

## 2024-07-16 DIAGNOSIS — R42 Dizziness and giddiness: Secondary | ICD-10-CM | POA: Diagnosis not present

## 2024-07-16 DIAGNOSIS — I6782 Cerebral ischemia: Secondary | ICD-10-CM | POA: Diagnosis not present

## 2024-07-16 DIAGNOSIS — M549 Dorsalgia, unspecified: Secondary | ICD-10-CM | POA: Diagnosis not present

## 2024-07-16 DIAGNOSIS — R519 Headache, unspecified: Secondary | ICD-10-CM | POA: Diagnosis not present

## 2024-07-16 DIAGNOSIS — I700 Atherosclerosis of aorta: Secondary | ICD-10-CM | POA: Insufficient documentation

## 2024-07-16 DIAGNOSIS — M51369 Other intervertebral disc degeneration, lumbar region without mention of lumbar back pain or lower extremity pain: Secondary | ICD-10-CM | POA: Diagnosis not present

## 2024-07-16 DIAGNOSIS — Z794 Long term (current) use of insulin: Secondary | ICD-10-CM | POA: Diagnosis not present

## 2024-07-16 DIAGNOSIS — I517 Cardiomegaly: Secondary | ICD-10-CM | POA: Diagnosis not present

## 2024-07-16 DIAGNOSIS — R5383 Other fatigue: Secondary | ICD-10-CM | POA: Diagnosis not present

## 2024-07-16 DIAGNOSIS — R7989 Other specified abnormal findings of blood chemistry: Secondary | ICD-10-CM | POA: Diagnosis not present

## 2024-07-16 DIAGNOSIS — I2510 Atherosclerotic heart disease of native coronary artery without angina pectoris: Secondary | ICD-10-CM

## 2024-07-16 DIAGNOSIS — M48061 Spinal stenosis, lumbar region without neurogenic claudication: Secondary | ICD-10-CM | POA: Diagnosis not present

## 2024-07-16 LAB — TROPONIN T, HIGH SENSITIVITY
Troponin T High Sensitivity: 86 ng/L — ABNORMAL HIGH (ref 0–19)
Troponin T High Sensitivity: 78 ng/L — ABNORMAL HIGH (ref 0–19)

## 2024-07-16 LAB — URINALYSIS, ROUTINE W REFLEX MICROSCOPIC
Bilirubin Urine: NEGATIVE
Glucose, UA: 50 mg/dL — AB
Hgb urine dipstick: NEGATIVE
Ketones, ur: NEGATIVE mg/dL
Leukocytes,Ua: NEGATIVE
Nitrite: NEGATIVE
Protein, ur: 30 mg/dL — AB
Specific Gravity, Urine: 1.014 (ref 1.005–1.030)
pH: 7 (ref 5.0–8.0)

## 2024-07-16 LAB — RESP PANEL BY RT-PCR (RSV, FLU A&B, COVID)  RVPGX2
Influenza A by PCR: NEGATIVE
Influenza B by PCR: NEGATIVE
Resp Syncytial Virus by PCR: NEGATIVE
SARS Coronavirus 2 by RT PCR: NEGATIVE

## 2024-07-16 LAB — BASIC METABOLIC PANEL WITH GFR
Anion gap: 8 (ref 5–15)
BUN: 21 mg/dL (ref 8–23)
CO2: 28 mmol/L (ref 22–32)
Calcium: 10.2 mg/dL (ref 8.9–10.3)
Chloride: 105 mmol/L (ref 98–111)
Creatinine, Ser: 0.8 mg/dL (ref 0.44–1.00)
GFR, Estimated: >60 mL/min (ref >60)
Glucose, Bld: 107 mg/dL — ABNORMAL HIGH (ref 70–99)
Potassium: 3.9 mmol/L (ref 3.5–5.1)
Sodium: 141 mmol/L (ref 135–145)

## 2024-07-16 LAB — CBC WITH DIFFERENTIAL/PLATELET
Abs Immature Granulocytes: 0.03 K/uL (ref 0.00–0.07)
Basophils Absolute: 0.1 K/uL (ref 0.0–0.1)
Basophils Relative: 1 %
Eosinophils Absolute: 0.1 K/uL (ref 0.0–0.5)
Eosinophils Relative: 2 %
HCT: 33.9 % — ABNORMAL LOW (ref 36.0–46.0)
Hemoglobin: 10.9 g/dL — ABNORMAL LOW (ref 12.0–15.0)
Immature Granulocytes: 0 %
Lymphocytes Relative: 48 %
Lymphs Abs: 3.3 K/uL (ref 0.7–4.0)
MCH: 29.9 pg (ref 26.0–34.0)
MCHC: 32.2 g/dL (ref 30.0–36.0)
MCV: 93.1 fL (ref 80.0–100.0)
Monocytes Absolute: 0.8 K/uL (ref 0.1–1.0)
Monocytes Relative: 12 %
Neutro Abs: 2.6 K/uL (ref 1.7–7.7)
Neutrophils Relative %: 37 %
Platelets: 193 K/uL (ref 150–400)
RBC: 3.64 MIL/uL — ABNORMAL LOW (ref 3.87–5.11)
RDW: 13.5 % (ref 11.5–15.5)
WBC: 6.9 K/uL (ref 4.0–10.5)
nRBC: 0 % (ref 0.0–0.2)

## 2024-07-16 LAB — CBG MONITORING, ED: Glucose-Capillary: 138 mg/dL — ABNORMAL HIGH (ref 70–99)

## 2024-07-16 MED ORDER — SODIUM CHLORIDE 0.9 % IV BOLUS
500.0000 mL | Freq: Once | INTRAVENOUS | Status: AC
  Administered 2024-07-16: 500 mL via INTRAVENOUS

## 2024-07-16 MED ORDER — ASPIRIN 81 MG PO CHEW
324.0000 mg | CHEWABLE_TABLET | Freq: Once | ORAL | Status: AC
  Administered 2024-07-16: 324 mg via ORAL
  Filled 2024-07-16: qty 4

## 2024-07-16 NOTE — ED Triage Notes (Signed)
 Pt BIB ems for weakness, fatigue, and headaches for the past 4 days. Denies fever, falls, or recent injuries. Hx. DM. VS stable A&Ox4.

## 2024-07-16 NOTE — Discharge Instructions (Signed)
 While you were in the emergency room, you had blood work done that overall was normal.  At this time, we do not have a clear cause for your symptoms.  Please follow-up with your primary care doctor within 1 week.  I have sent a referral to the cardiology office.  They will contact you within 2 weeks for an appointment.  Return to the emergency room if you developed pain in your chest, difficulty with breathing, have a fall, develop difficulty walking or fever.

## 2024-07-16 NOTE — ED Provider Notes (Signed)
 [place] EMERGENCY DEPARTMENT AT [facility] Provider Note   CSN: [id number] Arrival date & time: 07/16/24  1108     Patient presents with: Weakness, Fatigue, Headache, and balance Issues   [name] is a 78 y.o. female.   78 year old female presents for evaluation of weakness and fatigue.  States she also has a headache.  States this has been on for 6 days but has gotten worse today.  States she also feels off balance.  States she is dizzy but does not feel like the room is moving.  She has not felt she is going to faint.  She denies any other symptoms or concerns.   Weakness Associated symptoms: dizziness and headaches   Associated symptoms: no abdominal pain, no arthralgias, no chest pain, no cough, no dysuria, no fever, no seizures, no shortness of breath and no vomiting   Headache Associated symptoms: dizziness and weakness   Associated symptoms: no abdominal pain, no back pain, no cough, no ear pain, no eye pain, no fever, no seizures, no sore throat and no vomiting        Prior to Admission medications   Medication Sig Start Date End Date Taking? Authorizing Provider  Alcohol Swabs (B-D SINGLE USE SWABS REGULAR) PADS USE AS DIRECTED 03/15/20   Antonio Meth, Yvonne R, DO  alendronate  (FOSAMAX ) 70 MG tablet TAKE ONE TABLET BY MOUTH ONCE WEEKLY ON WEDNESDAY 05/05/24   Lowne Chase, Yvonne R, DO  ALPRAZolam  (XANAX ) 0.5 MG tablet Take 1 tablet (0.5 mg total) by mouth 2 (two) times daily as needed for anxiety. 06/23/24   Antonio Meth Jamee JONELLE, DO  amLODipine  (NORVASC ) 10 MG tablet Take 1 tablet (10 mg total) by mouth daily. 05/05/24   Antonio Meth Jamee JONELLE, DO  aspirin  EC 81 MG tablet Take 81 mg by mouth daily.    [provider]  atorvastatin  (LIPITOR) 80 MG tablet Take 1 tablet (80 mg total) by mouth every evening. 03/04/24   Antonio Meth, Yvonne R, DO  buPROPion  (WELLBUTRIN  XL) 300 MG 24 hr tablet Take 1 tablet (300 mg total) by mouth daily. 07/12/23   Lowne  Chase, Yvonne R, DO  Carboxymethylcellulose Sodium (THERATEARS OP) Place 1 drop into both eyes daily as needed (dry eyes).    [provider]  cephALEXin (KEFLEX) 500 MG capsule Take 1 capsule (500 mg total) by mouth 2 (two) times daily. 07/11/24   Doretha Folks, MD  Ciclopirox  1 % shampoo Massage into scalp 2x a week for 4 weeks 03/09/24   Antonio Meth, Jamee JONELLE, DO  Continuous Glucose Receiver (DEXCOM G6 RECEIVER) DEVI Use to monitor glucose 01/09/24   Shamleffer, Donell Cardinal, MD  Continuous Glucose Sensor (DEXCOM G6 SENSOR) MISC Change sensor every10 days 06/22/24   Shamleffer, Ibtehal Jaralla, MD  Continuous Glucose Sensor (DEXCOM G7 SENSOR) MISC 1 Device by Does not apply route as directed. Every 10 days 03/31/24   Shamleffer, Donell Cardinal, MD  Continuous Glucose Transmitter (DEXCOM G6 TRANSMITTER) MISC REPLACE TRANSMITTER EVERY 90 DAYS 01/09/24   Shamleffer, Ibtehal Jaralla, MD  esomeprazole  (NEXIUM ) 40 MG capsule Take 1 capsule (40 mg total) by mouth daily. 03/11/24   Antonio Meth Jamee JONELLE, DO  fenofibrate  160 MG tablet Take 1 tablet (160 mg total) by mouth every evening. 05/05/24   Antonio Meth, Yvonne R, DO  ferrous sulfate  325 (65 FE) MG tablet Take 325 mg by mouth daily with breakfast.     [provider]  fexofenadine (ALLEGRA) 180 MG  tablet Take 180 mg by mouth daily as needed for allergies or rhinitis.    [provider]  FLUoxetine  (PROZAC ) 20 MG capsule 3 po every day 06/23/24   Antonio Meth, Yvonne R, DO  fluticasone  (FLONASE ) 50 MCG/ACT nasal spray INSTILL 2 SPRAYS IN EACH NOSTRIL DAILY 01/14/23   Antonio Meth, Yvonne R, DO  furosemide  (LASIX ) 20 MG tablet Take 1 tablet (20 mg total) by mouth daily. 06/24/24   Vannie Reche RAMAN, NP  glucose blood (CONTOUR NEXT TEST) test strip Use to check blood sugar up to three times daily 04/29/24   Shamleffer, Ibtehal Jaralla, MD  glucose blood (TRUE METRIX BLOOD GLUCOSE TEST) test strip USE AS DIRECTED 4 TIMES A DAY 10/27/19    Antonio Meth, Jamee SAUNDERS, DO  insulin  aspart (NOVOLOG  FLEXPEN) 100 UNIT/ML FlexPen Max daily 70 units 05/21/23   Shamleffer, Ibtehal Jaralla, MD  Insulin  Disposable Pump (OMNIPOD 5 G7 INTRO, GEN 5,) KIT 1 Device by Does not apply route every other day. 03/31/24   Shamleffer, Donell Cardinal, MD  Insulin  Disposable Pump (OMNIPOD 5 G7 PODS, GEN 5,) MISC 1 Device by Does not apply route every 3 (three) days. 03/31/24   Shamleffer, Ibtehal Jaralla, MD  Insulin  Pen Needle (COMFORT EZ PEN NEEDLES) 32G X 4 MM MISC 1 Device by Other route in the morning, at noon, in the evening, and at bedtime. as directed 05/21/23   Shamleffer, Donell Cardinal, MD  Iron , Ferrous Sulfate , 325 (65 Fe) MG TABS Take 325 mg by mouth daily. 06/26/24   Antonio Meth Jamee R, DO  metoprolol  succinate (TOPROL -XL) 50 MG 24 hr tablet TAKE ONE TABLET BY MOUTH ONCE DAILY WITH FOOD 05/05/24   Lowne Chase, Yvonne R, DO  Multiple Vitamin (MULTIVITAMIN WITH MINERALS) TABS Take 1 tablet by mouth daily.    [provider]  Omega-3 Fatty Acids (FISH OIL) 1000 MG CAPS Take 1 capsule by mouth in the morning and at bedtime.    [provider]  omeprazole  (PRILOSEC) 20 MG capsule Take 1 capsule (20 mg total) by mouth daily. 06/23/24   Antonio Meth Jamee SAUNDERS, DO  pioglitazone  (ACTOS ) 30 MG tablet Take 1 tablet (30 mg total) by mouth daily. 11/04/23   Shamleffer, Ibtehal Jaralla, MD  predniSONE  (DELTASONE ) 20 MG tablet Take 2 tablets (40 mg total) by mouth daily. 07/11/24   Doretha Folks, MD  valsartan  (DIOVAN ) 320 MG tablet Take 1 tablet (320 mg total) by mouth every morning. 05/05/24   Antonio Meth, Yvonne R, DO  Vitamin D , Ergocalciferol , (DRISDOL ) 1.25 MG (50000 UNIT) CAPS capsule Take 1 capsule (50,000 Units total) by mouth every 7 (seven) days. 03/11/24   Antonio Meth Jamee R, DO  vitamin E  400 UNIT capsule Take 400 Units by mouth daily.    [provider]    Allergies: Metformin and related, Levemir [insulin  detemir],  Clindamycin, and Crestor  [rosuvastatin  calcium ]    Review of Systems  Constitutional:  Negative for chills and fever.  HENT:  Negative for ear pain and sore throat.   Eyes:  Negative for pain and visual disturbance.  Respiratory:  Negative for cough and shortness of breath.   Cardiovascular:  Negative for chest pain and palpitations.  Gastrointestinal:  Negative for abdominal pain and vomiting.  Genitourinary:  Negative for dysuria and hematuria.  Musculoskeletal:  Negative for arthralgias and back pain.  Skin:  Negative for color change and rash.  Neurological:  Positive for dizziness, weakness and headaches. Negative for seizures and syncope.  All  other systems reviewed and are negative.   Updated Vital Signs BP (!) 175/65 (BP Location: Left Arm)   Pulse (!) 56   Temp 97.7 F (36.5 C) (Oral)   Resp 16   Ht 5' 5 (1.651 m)   Wt 81.5 kg   SpO2 100%   BMI 29.90 kg/m   Physical Exam Vitals and nursing note reviewed.  Constitutional:      General: She is not in acute distress.    Appearance: She is well-developed. She is not ill-appearing.  HENT:     Head: Normocephalic and atraumatic.  Eyes:     Conjunctiva/sclera: Conjunctivae normal.  Cardiovascular:     Rate and Rhythm: Normal rate and regular rhythm.     Heart sounds: No murmur heard. Pulmonary:     Effort: Pulmonary effort is normal. No respiratory distress.     Breath sounds: Normal breath sounds.  Abdominal:     Palpations: Abdomen is soft.     Tenderness: There is no abdominal tenderness.  Musculoskeletal:        General: No swelling.     Cervical back: Neck supple.  Skin:    General: Skin is warm and dry.     Capillary Refill: Capillary refill takes less than 2 seconds.  Neurological:     Mental Status: She is alert and oriented to person, place, and time. Mental status is at baseline.     GCS: GCS eye subscore is 4. GCS verbal subscore is 5. GCS motor subscore is 6.     Cranial Nerves: No cranial nerve  deficit or facial asymmetry.  Psychiatric:        Mood and Affect: Mood normal.     (all labs ordered are listed, but only abnormal results are displayed) Labs Reviewed  BASIC METABOLIC PANEL WITH GFR - Abnormal; Notable for the following components:      Result Value   Glucose, Bld 107 (*)    All other components within normal limits  CBC WITH DIFFERENTIAL/PLATELET - Abnormal; Notable for the following components:   RBC 3.64 (*)    Hemoglobin 10.9 (*)    HCT 33.9 (*)    All other components within normal limits  URINALYSIS, ROUTINE W REFLEX MICROSCOPIC - Abnormal; Notable for the following components:   Glucose, UA 50 (*)    Protein, ur 30 (*)    Bacteria, UA RARE (*)    All other components within normal limits  CBG MONITORING, ED - Abnormal; Notable for the following components:   Glucose-Capillary 138 (*)    All other components within normal limits  TROPONIN T, HIGH SENSITIVITY - Abnormal; Notable for the following components:   Troponin T High Sensitivity 86 (*)    All other components within normal limits  RESP PANEL BY RT-PCR (RSV, FLU A&B, COVID)  [id number]  TROPONIN T, HIGH SENSITIVITY    EKG: EKG Interpretation Date/Time:  Thursday July 16 2024 11:24:23 EDT Ventricular Rate:  58 PR Interval:  172 QRS Duration:  132 QT Interval:  399 QTC Calculation: 392 R Axis:   -73  Text Interpretation: 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 History of prior fine-needle aspiration biopsy of right and left-sided thyroid  nodules in June of 2010. EXAM: THYROID  ULTRASOUND TECHNIQUE: Ultrasound examination of the thyroid  gland and adjacent soft tissues was performed. COMPARISON:  Most recent prior thyroid  ultrasound 02/16/2020; 08/11/2018 FINDINGS: Parenchymal Echotexture:  Markedly heterogenous Isthmus: 1.8 cm Right lobe: 7.6 x 4.2 x 5.0 cm Left lobe: 9.0 x 5.0 x 5.5 cm _________________________________________________________ Estimated total number of nodules >/= 1 cm: 2 Number of spongiform nodules >/=  2 cm not described below (TR1): 0 Number of mixed cystic and solid nodules >/= 1.5 cm not described below (TR2): 0 _________________________________________________________ Nodule # 1: Isoechoic solid nodule in the thyroid  isthmus is similar to slightly smaller at 1.3 x 1.3 x 1.3 cm. This nodule no longer meets criteria for continued imaging surveillance. Nodule # 2: The previously biopsied nodule in the right lower gland is grossly unchanged at 4.2 by 3.1 x 3.6 cm compared to 4.4 x 2.6 x 3.6 cm previously. IMPRESSION: 1. Decreasing size of isthmic thyroid  nodule which no longer meets criteria for imaging surveillance. 2. Continued stability of previously biopsied right inferior thyroid  gland. 3. Overall, similar appearance of diffusely enlarged, heterogeneous and goitrous thyroid  gland. The above is in keeping with the ACR TI-RADS recommendations - J Am Coll Radiol 2017;14:587-595. Electronically Signed   By: Wilkie Lent M.D.   On: 07/16/2024 15:08   DG Lumbar Spine Complete Result Date: 07/16/2024 CLINICAL DATA:  Back pain. EXAM: LUMBAR SPINE - COMPLETE 4+ VIEW COMPARISON:  09/12/2023 FINDINGS: There are 6 non-rib-bearing lumbar vertebra. The lower most lumbar vertebra will be labeled L5. Trace anterolisthesis L3 on L4. Vertebral body heights are normal. No fracture or compression deformity. Minor anterior spurring with slight L4-L5 disc space narrowing. Lower lumbar facet hypertrophy. No visible pars defects. Advanced aortic atherosclerosis. Sacroiliac joints are congruent. IMPRESSION: 1. Mild degenerative disc disease and facet hypertrophy in the lumbar spine. 2. Trace anterolisthesis of L3 on L4. 3. Advanced aortic atherosclerosis. Electronically Signed   By: Andrea Gasman M.D.   On: 07/16/2024 13:07   DG Chest 1 View Result Date: 07/16/2024 CLINICAL DATA:  Back pain.  Fatigue and weakness. EXAM: CHEST  1 VIEW COMPARISON:  Radiograph 09/24/2019 FINDINGS: The heart is mildly enlarged. Aortic atherosclerosis. Subsegmental atelectasis in the lung bases. No confluent opacity, pulmonary edema, pleural effusion or pneumothorax. On limited assessment, no acute osseous findings IMPRESSION: 1. Mild cardiomegaly. 2. Subsegmental atelectasis in the lung bases. Electronically Signed   By: Andrea Gasman M.D.   On: 07/16/2024 13:05   CT Head Wo Contrast Result Date: 07/16/2024 CLINICAL DATA:  Provided history: Dizziness, weakness. Additional history provided: Headaches, fatigue. EXAM: CT HEAD WITHOUT CONTRAST TECHNIQUE: Contiguous axial images were obtained from the base of the skull through the vertex without intravenous contrast. RADIATION DOSE REDUCTION: This exam was performed according to the departmental dose-optimization program which includes automated exposure control, adjustment of the mA and/or kV according to patient size and/or use of iterative reconstruction technique. COMPARISON:  Brain MRI 12/31/2014. FINDINGS: Brain: No age-advanced or lobar predominant cerebral atrophy. Mild cerebral white matter chronic small vessel ischemic disease, better appreciated on the prior brain MRI of 12/31/2014. There is no acute intracranial hemorrhage. No demarcated cortical infarct. No extra-axial fluid collection. No evidence of an intracranial mass. No midline shift. Vascular: No hyperdense vessel. Atherosclerotic calcifications. Skull: No calvarial fracture or aggressive osseous lesion. Sinuses/Orbits: No mass or acute finding within the imaged orbits. No significant paranasal sinus disease at the imaged levels. IMPRESSION: 1. No evidence of an acute intracranial abnormality. 2. Mild chronic small vessel ischemic changes within the cerebral white matter. Electronically Signed    By: Rockey Childs D.O.  On: 07/16/2024 12:23     Procedures   Medications Ordered in the ED  sodium chloride  0.9 % bolus 500 mL (0 mLs Intravenous Stopped 07/16/24 1556)  aspirin  chewable tablet 324 mg (324 mg Oral Given 07/16/24 1452)                                    Medical Decision Making Cardiac monitor interpretation: Sinus rhythm, no ectopy  Patient's lab workup reviewed by me and first troponin elevated, labs and workup otherwise fairly unremarkable and she is feeling better after some fluids.  Discussed with cardiology and if second troponin is elevated we will admit her if not she can follow-up with them outpatient.  Patient signed out to provider at 5 PM pending remainder of workup and ultimate disposition.  Family agrees with plan of treatment  Problems Addressed: Elevated troponin: undiagnosed new problem with uncertain prognosis Nonintractable headache, unspecified chronicity pattern, unspecified headache type: acute illness or injury Weakness: acute illness or injury  Amount and/or Complexity of Data Reviewed External Data Reviewed: notes.    Details: Prior ED records reviewed and patient was seen 5 days ago for a local reaction to an injection Labs: ordered. Decision-making details documented in ED Course.    Details: Ordered and reviewed by me and troponin is positive but labs otherwise fairly unremarkable Radiology: ordered and independent interpretation performed. Decision-making details documented in ED Course.    Details: Ordered and interpreted by me independently radiology Chest x-ray: Shows no acute abnormality in the chest CT head: Shows no acute intracranial process ECG/medicine tests: ordered and independent interpretation performed. Decision-making details documented in ED Course.    Details: Ordered and interpreted by me.  Discussed with cardiology, shows no evidence of STEMI or acute change compared to prior partial hysterectomy. Discussion of  management or test interpretation with external provider(s): Dr. Francyne -cardiology [facility] I spoke with him on the phone regarding patient's case.  He said a second troponin is elevated admitted for workup and if negative she can follow-up outpatient with cardiology  Risk OTC drugs. Prescription drug management. Drug therapy requiring intensive monitoring for toxicity.     Final diagnoses:  Weakness  Elevated troponin  Nonintractable headache, unspecified chronicity pattern, unspecified headache type    ED Discharge Orders     None          Gennaro Duwaine CROME, DO 07/16/24 1659

## 2024-07-16 NOTE — ED Provider Notes (Signed)
  Physical Exam  BP (!) 161/63   Pulse (!) 56   Temp 98.4 F (36.9 C) (Oral)   Resp 16   Ht 5' 5 (1.651 m)   Wt 81.5 kg   SpO2 99%   BMI 29.90 kg/m   Physical Exam  Procedures  Procedures  ED Course / MDM    Medical Decision Making Amount and/or Complexity of Data Reviewed Labs: ordered. Radiology: ordered.  Risk OTC drugs.   Assumed care from this patient at signout.  78 year old female here today for weakness, fatigue, headache of 2 weeks duration.  Patient signed out pending second troponin.  Dr. Remonia had spoken with cardiology recommended delta Trope on the patient and if not elevated, recommended discharge with follow-up.  Second Trope not elevated, blood work appears stable.  Evaluated the patient, she complained of some pain in her armpit.  Palpated the area, no obvious abscess, fluctuation, warm and well-perfused.  Discharge cardiology follow-up, return precautions discussed with family at bedside.  She is going to follow-up with her primary care doctor next week.      Mannie Pac T, DO 07/16/24 1726

## 2024-07-16 NOTE — ED Notes (Signed)
 Pt to Ct

## 2024-07-17 ENCOUNTER — Ambulatory Visit (INDEPENDENT_AMBULATORY_CARE_PROVIDER_SITE_OTHER)

## 2024-07-17 DIAGNOSIS — R0609 Other forms of dyspnea: Secondary | ICD-10-CM

## 2024-07-17 LAB — ECHOCARDIOGRAM COMPLETE
AR max vel: 1.55 cm2
AV Area VTI: 1.71 cm2
AV Area mean vel: 1.6 cm2
AV Mean grad: 4 mmHg
AV Peak grad: 7 mmHg
Ao pk vel: 1.32 m/s
Area-P 1/2: 3.87 cm2
S' Lateral: 2.63 cm

## 2024-07-18 ENCOUNTER — Other Ambulatory Visit (HOSPITAL_BASED_OUTPATIENT_CLINIC_OR_DEPARTMENT_OTHER): Payer: Self-pay | Admitting: Family

## 2024-07-18 DIAGNOSIS — F419 Anxiety disorder, unspecified: Secondary | ICD-10-CM | POA: Diagnosis not present

## 2024-07-18 DIAGNOSIS — E785 Hyperlipidemia, unspecified: Secondary | ICD-10-CM | POA: Diagnosis not present

## 2024-07-18 DIAGNOSIS — Z87891 Personal history of nicotine dependence: Secondary | ICD-10-CM | POA: Diagnosis not present

## 2024-07-18 DIAGNOSIS — M1990 Unspecified osteoarthritis, unspecified site: Secondary | ICD-10-CM | POA: Diagnosis not present

## 2024-07-18 DIAGNOSIS — Z809 Family history of malignant neoplasm, unspecified: Secondary | ICD-10-CM | POA: Diagnosis not present

## 2024-07-18 DIAGNOSIS — K219 Gastro-esophageal reflux disease without esophagitis: Secondary | ICD-10-CM | POA: Diagnosis not present

## 2024-07-18 DIAGNOSIS — R45851 Suicidal ideations: Secondary | ICD-10-CM | POA: Diagnosis not present

## 2024-07-18 DIAGNOSIS — R5383 Other fatigue: Secondary | ICD-10-CM

## 2024-07-18 DIAGNOSIS — I129 Hypertensive chronic kidney disease with stage 1 through stage 4 chronic kidney disease, or unspecified chronic kidney disease: Secondary | ICD-10-CM | POA: Diagnosis not present

## 2024-07-18 DIAGNOSIS — R7989 Other specified abnormal findings of blood chemistry: Secondary | ICD-10-CM

## 2024-07-18 DIAGNOSIS — F320 Major depressive disorder, single episode, mild: Secondary | ICD-10-CM | POA: Diagnosis not present

## 2024-07-18 DIAGNOSIS — Z79899 Other long term (current) drug therapy: Secondary | ICD-10-CM

## 2024-07-18 DIAGNOSIS — Z59869 Financial insecurity, unspecified: Secondary | ICD-10-CM | POA: Diagnosis not present

## 2024-07-18 DIAGNOSIS — R32 Unspecified urinary incontinence: Secondary | ICD-10-CM | POA: Diagnosis not present

## 2024-07-18 DIAGNOSIS — I10 Essential (primary) hypertension: Secondary | ICD-10-CM

## 2024-07-18 DIAGNOSIS — N1831 Chronic kidney disease, stage 3a: Secondary | ICD-10-CM | POA: Diagnosis not present

## 2024-07-18 DIAGNOSIS — Z794 Long term (current) use of insulin: Secondary | ICD-10-CM | POA: Diagnosis not present

## 2024-07-18 DIAGNOSIS — I2510 Atherosclerotic heart disease of native coronary artery without angina pectoris: Secondary | ICD-10-CM | POA: Diagnosis not present

## 2024-07-18 DIAGNOSIS — E1136 Type 2 diabetes mellitus with diabetic cataract: Secondary | ICD-10-CM | POA: Diagnosis not present

## 2024-07-18 DIAGNOSIS — E1122 Type 2 diabetes mellitus with diabetic chronic kidney disease: Secondary | ICD-10-CM | POA: Diagnosis not present

## 2024-07-18 DIAGNOSIS — M810 Age-related osteoporosis without current pathological fracture: Secondary | ICD-10-CM | POA: Diagnosis not present

## 2024-07-18 DIAGNOSIS — E669 Obesity, unspecified: Secondary | ICD-10-CM | POA: Diagnosis not present

## 2024-07-23 NOTE — Progress Notes (Signed)
 Name: [name]  Age/ Sex: 78 y.o., female   MRN/ DOB: [id number], [birth year]     PCP: Antonio Cyndee Jamee JONELLE, DO   Reason for Endocrinology Evaluation: Type 2 Diabetes Mellitus  Initial Endocrine Consultative Visit: 01/17/2021    PATIENT IDENTIFIER: [name] is a 78 y.o. female with a past medical history of HTN, T2Dm, CAD , Osteoporosis and Hx of pancreatitis. The patient has followed with Endocrinology clinic since 4 f/09/2021 or consultative assistance with management of her diabetes.  DIABETIC HISTORY:  [name] was diagnosed with DM in 1998, has intolerance to Metformin due to Gi side effects. Her hemoglobin A1c has ranged from 9.3% in 2015, peaking at 10.1% in 2020.  She has history of Pancreatitis that she believes is caused Metformin   On her initial visit to our clinic she had an A1c of 9.4%  , she was on Jardiance ,Glimepiride  , Januvia and insulin  mix . We stopped Glimepiride  as she was not taking it, we stopped Januvia due to hx of pancreatitis. We continued jardiance  and adjusted insulin  mix   Switched insulin  Mix to Basal/prandial insulin  05/2021  The patient did stop Jardiance  in June, 2025 due to oral thrush, but she was restarted by cardiology in October, 2025  She was switched to the [facility] in July, 2025  Discontinued pioglitazone  due to edema October, 2025   SUBJECTIVE:   During the last visit (03/31/2024): A1c 8.2%    Today (07/24/2024): [name] is here for a follow up on diabetes management. She checks her blood sugars multiple times daily. The patient has not had hypoglycemic episodes since the last clinic visit.     Patient presented to the emergency department on 07/16/2024 due to weakness, fatigue, headaches, and balance issues, the patient was noted with anemia, serum glucose 138 MGs/DL, elevated troponins  She continues with SOB with cough , which is dry , worse at night  She has LE swelling  No diarrhea but has occasional  constipation  No nausea    This patient with type 2 diabetes is treated with Omnipod  (insulin  pump). During the visit the pump basal and bolus doses were reviewed including carb/insulin  rations and supplemental doses. The clinical list was updated. The glucose meter download was reviewed in detail to determine if the current pump settings are providing the best glycemic control without excessive hypoglycemia.  Pump and meter download:    Pump   OmniPod Settings   Insulin  type   NovoLog    Basal rate       0000 1.35 u/h               I:C ratio       0000 1:1                  Sensitivity       0000  25      Goal       0000  120            Type & Model of Pump: omnipod Insulin  Type: Currently using NovoLog .  Body mass index is 32.78 kg/m.  PUMP STATISTICS: Average BG: 199  Average Daily Carbs (g): 29.6 Average Total Daily Insulin : 70.2  Average Daily Basal: 40.7 (58 %) Average Daily Bolus: 29.5 (42 %)     HOME DIABETES REGIMEN:  Pioglitazone  30 mg daily     Statin: yes ACE-I/ARB: yes   CONTINUOUS GLUCOSE MONITORING RECORD INTERPRETATION    Dates of Recording :  10/4-10/17/2025  Sensor description: dexcom  Results statistics:   CGM use % of time 55.6  Average and SD 199/68  Time in range 44  % Time Above 180 33  % Time above 250 23  % Time Below target 0    Glycemic patterns summary: BGs trend down overnight to optimal and fluctuate during the day  Hyperglycemic episodes postprandial  Hypoglycemic episodes occurred n/a  Overnight periods: Starts high but trends down     DIABETIC COMPLICATIONS: Microvascular complications:  Neuropathy  Denies: CKD, retinopathy, neuropathy Last Eye Exam: Completed 01/2022  Macrovascular complications:  CAD ( S/P stent placement)  Denies: CVA, PVD   HISTORY:  Past Medical History:  Past Medical History:  Diagnosis Date   Blockage of coronary artery of heart (HCC)    Depression    Diabetes  mellitus    Hyperlipidemia    Hypertension    Pancreatitis    Restless leg syndrome    Past Surgical History:  Past Surgical History:  Procedure Laterality Date   CATARACT EXTRACTION, BILATERAL     CORONARY ANGIOPLASTY WITH STENT PLACEMENT     FOOT SURGERY Right    cyst removal   ROTATOR CUFF REPAIR     right   ROTATOR CUFF REPAIR Left    TUBAL LIGATION     Social History:  reports that she quit smoking about 4 years ago. Her smoking use included cigarettes. She has never used smokeless tobacco. She reports that she does not drink alcohol and does not use drugs. Family History:  Family History  Problem Relation Age of Onset   Heart attack Father    Heart attack Brother    Diabetes Sister    Cirrhosis Sister    Hypertension Sister    Cancer Mother    Hypertension Mother    Vision loss Mother    Cancer Sister    Sleep apnea Sister    Heart Problems Sister    Diabetes Sister    Gout Sister    Diabetes Son    Heart attack Son    Diabetes Daughter    Goiter Daughter    Diabetes Daughter      HOME MEDICATIONS: Allergies as of 07/24/2024       Reactions   Metformin And Related Other (See Comments)   Sick on stomach, sweaty, throw up, pain   Levemir [insulin  Detemir] Hives, Swelling   Clindamycin Nausea And Vomiting   Crestor  [rosuvastatin  Calcium ] Palpitations        Medication List        Accurate as of July 24, 2024 12:39 PM. If you have any questions, ask your nurse or doctor.          STOP taking these medications    pioglitazone  30 MG tablet Commonly known as: ACTOS        TAKE these medications    alendronate  70 MG tablet Commonly known as: FOSAMAX  TAKE ONE TABLET BY MOUTH ONCE WEEKLY ON WEDNESDAY   ALPRAZolam  0.5 MG tablet Commonly known as: XANAX  Take 1 tablet (0.5 mg total) by mouth 2 (two) times daily as needed for anxiety.   amLODipine  10 MG tablet Commonly known as: NORVASC  Take 1 tablet (10 mg total) by mouth daily.    aspirin  EC 81 MG tablet Take 81 mg by mouth daily.   atorvastatin  80 MG tablet Commonly known as: LIPITOR Take 1 tablet (80 mg total) by mouth every evening.   B-D SINGLE USE SWABS REGULAR Pads USE AS DIRECTED  buPROPion  300 MG 24 hr tablet Commonly known as: Wellbutrin  XL Take 1 tablet (300 mg total) by mouth daily.   cephALEXin 500 MG capsule Commonly known as: KEFLEX Take 1 capsule (500 mg total) by mouth 2 (two) times daily.   Ciclopirox  1 % shampoo Massage into scalp 2x a week for 4 weeks   Comfort EZ Pen Needles 32G X 4 MM Misc Generic drug: Insulin  Pen Needle 1 Device by Other route in the morning, at noon, in the evening, and at bedtime. as directed   Dexcom G6 Receiver Devi Use to monitor glucose   Dexcom G6 Transmitter Misc REPLACE TRANSMITTER EVERY 90 DAYS   Dexcom G7 Sensor Misc 1 Device by Does not apply route as directed. Every 10 days What changed: Another medication with the same name was removed. Continue taking this medication, and follow the directions you see here.   esomeprazole  40 MG capsule Commonly known as: NexIUM  Take 1 capsule (40 mg total) by mouth daily.   fenofibrate  160 MG tablet Take 1 tablet (160 mg total) by mouth every evening.   ferrous sulfate  325 (65 FE) MG tablet Take 325 mg by mouth daily with breakfast.   Iron  (Ferrous Sulfate ) 325 (65 Fe) MG Tabs Take 325 mg by mouth daily.   fexofenadine 180 MG tablet Commonly known as: ALLEGRA Take 180 mg by mouth daily as needed for allergies or rhinitis.   Fish Oil 1000 MG Caps Take 1 capsule by mouth in the morning and at bedtime.   FLUoxetine  20 MG capsule Commonly known as: PROZAC  3 po every day   fluticasone  50 MCG/ACT nasal spray Commonly known as: FLONASE  INSTILL 2 SPRAYS IN EACH NOSTRIL DAILY   furosemide  20 MG tablet Commonly known as: LASIX  TAKE 1 TABLET BY MOUTH DAILY   metoprolol  succinate 50 MG 24 hr tablet Commonly known as: TOPROL -XL TAKE ONE TABLET BY  MOUTH ONCE DAILY WITH FOOD   multivitamin with minerals Tabs tablet Take 1 tablet by mouth daily.   NovoLOG  FlexPen 100 UNIT/ML FlexPen Generic drug: insulin  aspart Max daily 70 units   omeprazole  20 MG capsule Commonly known as: PRILOSEC Take 1 capsule (20 mg total) by mouth daily.   Omnipod 5 G7 Pods (Gen 5) Misc 1 Device by Does not apply route every 3 (three) days.   Omnipod 5 G7 Intro (Gen 5) Kit 1 Device by Does not apply route every other day.   predniSONE  20 MG tablet Commonly known as: DELTASONE  Take 2 tablets (40 mg total) by mouth daily.   THERATEARS OP Place 1 drop into both eyes daily as needed (dry eyes).   True Metrix Blood Glucose Test test strip Generic drug: glucose blood USE AS DIRECTED 4 TIMES A DAY   Contour Next Test test strip Generic drug: glucose blood Use to check blood sugar up to three times daily   valsartan  320 MG tablet Commonly known as: DIOVAN  Take 1 tablet (320 mg total) by mouth every morning.   Vitamin D  (Ergocalciferol ) 1.25 MG (50000 UNIT) Caps capsule Commonly known as: DRISDOL  Take 1 capsule (50,000 Units total) by mouth every 7 (seven) days.   vitamin E  180 MG (400 UNITS) capsule Take 400 Units by mouth daily.         OBJECTIVE:   Vital Signs: BP 120/74 (BP Location: Left Arm, Patient Position: Sitting, Cuff Size: Normal)   Pulse 78   Ht 5' 5 (1.651 m)   Wt 197 lb (89.4 kg)   BMI 32.78  kg/m   Wt Readings from Last 3 Encounters:  07/24/24 197 lb (89.4 kg)  07/16/24 179 lb 10.8 oz (81.5 kg)  07/11/24 180 lb (81.6 kg)     Exam: General: Pt appears well and is in NAD  Lungs: Clear with good BS bilat   Heart: RRR   Extremities: Trace pretibial edema, edema is worse around the ankles  Neuro: MS is good with appropriate affect, pt is alert and Ox3    DM foot exam: 11/04/2023  The skin of the feet is intact without sores or ulcerations. The pedal pulses are 2+ on right and 2+ on left. The sensation is intact  to a screening 5.07, 10 gram monofilament bilaterally     DATA REVIEWED:  Lab Results  Component Value Date   HGBA1C 7.9 (A) 07/24/2024   HGBA1C 8.2 (A) 03/31/2024   HGBA1C 8.2 (A) 11/04/2023    Latest Reference Range & Units 07/16/24 11:39  Sodium 135 - 145 mmol/L 141  Potassium 3.5 - 5.1 mmol/L 3.9  Chloride 98 - 111 mmol/L 105  CO2 22 - 32 mmol/L 28  Glucose 70 - 99 mg/dL 892 (H)  BUN 8 - 23 mg/dL 21  Creatinine 9.55 - 8.99 mg/dL 9.19  Calcium  8.9 - 10.3 mg/dL 89.7  Anion gap 5 - 15  8  GFR, Estimated >60 mL/min >60    Thyroid  Ultrasound 07/14/2024   FINDINGS: Parenchymal Echotexture: Markedly heterogenous   Isthmus: 1.8 cm   Right lobe: 7.6 x 4.2 x 5.0 cm   Left lobe: 9.0 x 5.0 x 5.5 cm   _________________________________________________________   Estimated total number of nodules >/= 1 cm: 2   Number of spongiform nodules >/=  2 cm not described below (TR1): 0   Number of mixed cystic and solid nodules >/= 1.5 cm not described below (TR2): 0   _________________________________________________________   Nodule # 1: Isoechoic solid nodule in the thyroid  isthmus is similar to slightly smaller at 1.3 x 1.3 x 1.3 cm. This nodule no longer meets criteria for continued imaging surveillance.   Nodule # 2: The previously biopsied nodule in the right lower gland is grossly unchanged at 4.2 by 3.1 x 3.6 cm compared to 4.4 x 2.6 x 3.6 cm previously.   IMPRESSION: 1. Decreasing size of isthmic thyroid  nodule which no longer meets criteria for imaging surveillance. 2. Continued stability of previously biopsied right inferior thyroid  gland. 3. Overall, similar appearance of diffusely enlarged, heterogeneous and goitrous thyroid  gland.      ASSESSMENT / PLAN / RECOMMENDATIONS:   1) Type 2 Diabetes Mellitus, Suboptimally  controlled , With CKD III, macrovascular  complications - Most recent A1c of 7.9 %. Goal A1c < 7.0 %.    -A1c is trending down - GLP-1  agonists and DPP-4 inhibitors are contra-indicated due to hx of pancreatitis  -Intolerant to metformin - She has stopped Jardiance  due to thrush, which I did explain to her at the time that this was not a side effect of Jardiance  but rather genital infections, per cardiology they are interested in restarting on this, she has an appointment with cardiology in 10 days - Patient continues with hyperglycemia, I will increase basal rate as below - I will discontinue pioglitazone  due to edema - Patient has been noted with mild allergic reaction to OmniPod adhesive, she was advised to use Flonase , apply to the skin, let it dry, before applying the patch - She continues to use Dexcom G6, and new prescription of Dexcom  G7 was sent in July - I will increase grams of carbohydrates with meals  MEDICATIONS: - Stop pioglitazone  30 mg daily - NovoLog     Pump   OmniPod Settings   Insulin  type   NovoLog    Basal rate       0000 1.45 u/h               I:C ratio       0000 1:1    Enter# 18g with breakfast and 20 g with supper              Sensitivity       0000  25      Goal       0000  120          EDUCATION / INSTRUCTIONS: BG monitoring instructions: Patient is instructed to check her blood sugars 3 times a day, before meals. Call [place] [facility] if: BG persistently < 70  I reviewed the Rule of 15 for the treatment of hypoglycemia in detail with the patient. Literature supplied.   2) Diabetic complications:  Eye: Does not have known diabetic retinopathy.  Neuro/ Feet: Does not have known diabetic peripheral neuropathy .  Renal: Patient does not have known baseline CKD. She   is  on an ACEI/ARB at present.    3) MNG:  - Patient was diagnosed with multinodular goiter on ultrasound in 2019 - She is s/p benign FNA of the right and left nodules in 2009 - Repeat thyroid  ultrasound in October, 2025 showed stability or decrease in size of the nodules   F/U in 3 months      Signed electronically by: Stefano Redgie Butts, MD  [facility]  [facility] [address] [place], [place] [address] Phone: [phone] FAX: [phone]   CC: Antonio Cyndee Jamee JONELLE, DO [address] [place] [address] Phone: [phone]  Fax: [phone]  Return to Endocrinology clinic as below: Future Appointments  Date Time Provider Department Center  07/28/2024  7:15 AM [facility] LAB [facility] [address]  07/28/2024  8:00 AM [facility] DEXA 1 [facility]  08/03/2024  2:20 PM Vannie Reche RAMAN, NP [facility] [address]  06/10/2025  8:10 AM [facility] [place] [facility] VISIT 2 [facility] [address]

## 2024-07-24 ENCOUNTER — Encounter: Payer: Self-pay | Admitting: Internal Medicine

## 2024-07-24 ENCOUNTER — Ambulatory Visit: Admitting: Internal Medicine

## 2024-07-24 VITALS — BP 120/74 | HR 78 | Ht 65.0 in | Wt 197.0 lb

## 2024-07-24 DIAGNOSIS — E042 Nontoxic multinodular goiter: Secondary | ICD-10-CM

## 2024-07-24 DIAGNOSIS — E1159 Type 2 diabetes mellitus with other circulatory complications: Secondary | ICD-10-CM

## 2024-07-24 DIAGNOSIS — E1165 Type 2 diabetes mellitus with hyperglycemia: Secondary | ICD-10-CM

## 2024-07-24 DIAGNOSIS — Z794 Long term (current) use of insulin: Secondary | ICD-10-CM

## 2024-07-24 LAB — POCT GLYCOSYLATED HEMOGLOBIN (HGB A1C): Hemoglobin A1C: 7.9 % — AB (ref 4.0–5.6)

## 2024-07-24 MED ORDER — DEXCOM G7 SENSOR MISC: 1.0000 | 3 refills | Status: AC

## 2024-07-24 NOTE — Patient Instructions (Addendum)
 STOP Pioglitazone   Enter 18 grams of carbohydrates with brunch and 20 g of carbohydrates with supper   Please use Flonase , sprayed on the skin prior to applying the OmniPod, let it dry, then apply the OmniPod    HOW TO TREAT LOW BLOOD SUGARS (Blood sugar LESS THAN 70 MG/DL) Please follow the RULE OF 15 for the treatment of hypoglycemia treatment (when your (blood sugars are less than 70 mg/dL)   STEP 1: Take 15 grams of carbohydrates when your blood sugar is low, which includes:  3-4 GLUCOSE TABS  OR 3-4 OZ OF JUICE OR REGULAR SODA OR ONE TUBE OF GLUCOSE GEL    STEP 2: RECHECK blood sugar in 15 MINUTES STEP 3: If your blood sugar is still low at the 15 minute recheck --> then, go back to STEP 1 and treat AGAIN with another 15 grams of carbohydrates.

## 2024-07-28 ENCOUNTER — Other Ambulatory Visit (HOSPITAL_BASED_OUTPATIENT_CLINIC_OR_DEPARTMENT_OTHER)

## 2024-07-28 ENCOUNTER — Other Ambulatory Visit

## 2024-07-28 DIAGNOSIS — D649 Anemia, unspecified: Secondary | ICD-10-CM

## 2024-07-28 LAB — IRON,TIBC AND FERRITIN PANEL
%SAT: 22 % (ref 16–45)
Ferritin: 518 ng/mL — ABNORMAL HIGH (ref 16–288)
Iron: 71 ug/dL (ref 45–160)
TIBC: 324 ug/dL (ref 250–450)

## 2024-07-29 LAB — VITAMIN B12: Vitamin B-12: 1480 pg/mL — ABNORMAL HIGH (ref 211–911)

## 2024-07-29 LAB — CBC WITH DIFFERENTIAL/PLATELET
Basophils Absolute: 0.1 K/uL (ref 0.0–0.1)
Basophils Relative: 1 % (ref 0.0–3.0)
Eosinophils Absolute: 0.1 K/uL (ref 0.0–0.7)
Eosinophils Relative: 1.5 % (ref 0.0–5.0)
HCT: 33.6 % — ABNORMAL LOW (ref 36.0–46.0)
Hemoglobin: 11.1 g/dL — ABNORMAL LOW (ref 12.0–15.0)
Lymphocytes Relative: 39.3 % (ref 12.0–46.0)
Lymphs Abs: 2.4 K/uL (ref 0.7–4.0)
MCHC: 33 g/dL (ref 30.0–36.0)
MCV: 90.8 fl (ref 78.0–100.0)
Monocytes Absolute: 0.6 K/uL (ref 0.1–1.0)
Monocytes Relative: 9.1 % (ref 3.0–12.0)
Neutro Abs: 3 K/uL (ref 1.4–7.7)
Neutrophils Relative %: 49.1 % (ref 43.0–77.0)
Platelets: 159 K/uL (ref 150.0–400.0)
RBC: 3.7 Mil/uL — ABNORMAL LOW (ref 3.87–5.11)
RDW: 14.6 % (ref 11.5–15.5)
WBC: 6.1 K/uL (ref 4.0–10.5)

## 2024-07-31 ENCOUNTER — Ambulatory Visit: Admitting: Internal Medicine

## 2024-08-02 ENCOUNTER — Ambulatory Visit: Payer: Self-pay | Admitting: Family Medicine

## 2024-08-03 ENCOUNTER — Encounter (HOSPITAL_BASED_OUTPATIENT_CLINIC_OR_DEPARTMENT_OTHER): Payer: Self-pay | Admitting: Family

## 2024-08-03 ENCOUNTER — Telehealth: Payer: Self-pay | Admitting: Pharmacy Technician

## 2024-08-03 ENCOUNTER — Emergency Department (HOSPITAL_BASED_OUTPATIENT_CLINIC_OR_DEPARTMENT_OTHER)
Admission: EM | Admit: 2024-08-03 | Discharge: 2024-08-03 | Disposition: A | Discharge status: Home / Self Care | Attending: Emergency Medicine | Admitting: Emergency Medicine

## 2024-08-03 ENCOUNTER — Other Ambulatory Visit: Payer: Self-pay

## 2024-08-03 ENCOUNTER — Ambulatory Visit (INDEPENDENT_AMBULATORY_CARE_PROVIDER_SITE_OTHER): Admitting: Family

## 2024-08-03 ENCOUNTER — Other Ambulatory Visit (HOSPITAL_COMMUNITY): Payer: Self-pay

## 2024-08-03 VITALS — BP 138/62 | HR 63 | Ht 65.0 in | Wt 194.5 lb

## 2024-08-03 DIAGNOSIS — J069 Acute upper respiratory infection, unspecified: Secondary | ICD-10-CM | POA: Diagnosis not present

## 2024-08-03 DIAGNOSIS — I5032 Chronic diastolic (congestive) heart failure: Secondary | ICD-10-CM

## 2024-08-03 DIAGNOSIS — I10 Essential (primary) hypertension: Secondary | ICD-10-CM | POA: Diagnosis not present

## 2024-08-03 DIAGNOSIS — E785 Hyperlipidemia, unspecified: Secondary | ICD-10-CM

## 2024-08-03 DIAGNOSIS — Z7982 Long term (current) use of aspirin: Secondary | ICD-10-CM | POA: Insufficient documentation

## 2024-08-03 DIAGNOSIS — Z794 Long term (current) use of insulin: Secondary | ICD-10-CM | POA: Diagnosis not present

## 2024-08-03 DIAGNOSIS — I25118 Atherosclerotic heart disease of native coronary artery with other forms of angina pectoris: Secondary | ICD-10-CM

## 2024-08-03 DIAGNOSIS — R059 Cough, unspecified: Secondary | ICD-10-CM | POA: Diagnosis present

## 2024-08-03 LAB — RESP PANEL BY RT-PCR (RSV, FLU A&B, COVID)  RVPGX2
Influenza A by PCR: NEGATIVE
Influenza B by PCR: NEGATIVE
Resp Syncytial Virus by PCR: NEGATIVE
SARS Coronavirus 2 by RT PCR: NEGATIVE

## 2024-08-03 MED ORDER — DAPAGLIFLOZIN PROPANEDIOL 10 MG PO TABS: 10.0000 mg | ORAL_TABLET | Freq: Every day | ORAL | 5 refills | Status: AC

## 2024-08-03 MED ORDER — AZITHROMYCIN 250 MG PO TABS: 250.0000 mg | ORAL_TABLET | Freq: Every day | ORAL | 0 refills | Status: DC

## 2024-08-03 MED ORDER — DM-GUAIFENESIN ER 30-600 MG PO TB12: 1.0000 | ORAL_TABLET | Freq: Two times a day (BID) | ORAL | 0 refills | Status: DC

## 2024-08-03 MED ORDER — DAPAGLIFLOZIN PROPANEDIOL 10 MG PO TABS: 10.0000 mg | ORAL_TABLET | Freq: Every day | ORAL | 0 refills | Status: DC

## 2024-08-03 NOTE — Patient Instructions (Signed)
 Medication Instructions:   START TAKING FARXIGA 10 MG BY MOUTH DAILY BEFORE BREAKFAST  *If you need a refill on your cardiac medications before your next appointment, please call your pharmacy*  Lab Work:  IN ONE WEEK HERE AT OUR [facility] ON THE 3RD FLOOR [address]--LIPIDS AND CMET---PLEASE COME FASTING TO THIS LAB APPOINTMENT  If you have labs (blood work) drawn today and your tests are completely normal, you will receive your results only by: MyChart Message (if you have MyChart) OR A paper copy in the mail If you have any lab test that is abnormal or we need to change your treatment, we will call you to review the results.    Follow-Up:  3 MONTHS WITH DR. LONNI OR CAITLIN WALKER, NP

## 2024-08-03 NOTE — Discharge Instructions (Addendum)
 You have been seen and discharged from the emergency department.  You have been diagnosed with an upper respiratory infection.  Your influenza/COVID/RSV swab is negative.  Please take medications as directed.  You may also use over-the-counter medication for symptom control.  Stay well-hydrated.  Follow-up with your primary provider for further evaluation and further care. Take home medications as prescribed. If you have any worsening symptoms or further concerns for your health please return to an emergency department for further evaluation.

## 2024-08-03 NOTE — Progress Notes (Signed)
 Cardiology Office Note   Date:  08/03/2024  ID:  [name], DOB January 15, 1946, MRN [id number] PCP: Antonio Meth, Jamee SAUNDERS, DO  [facility] Providers Cardiologist:  Shelda Bruckner, MD     History of Present Illness [name] is a 78 y.o. female  with a hx of CAD, DM 2, hyperlipidemia, hypertension, CKD 3a, RBBB, GERD, pancreatitis   Prior history of cardiac stents the uncertain where, what type, how many.  Last cath 2001.  Echo and Lexi scan ordered in 2017 due to fatigue.  Echo 02/2016 normal LVEF 60 to 65%, grade 1 diastolic dysfunction, mild MR.  Myoview  02/19/2016 with no reversible ischemia or infarction, EF 58%, low risk study.   At visit 02/06/22 she was referred to PREP exercise program. Recommended to follow up in 6 months but lost to follow up since that time.   She contacted the office 06/12/24 with chest pain on and off for one week described as burning worse after eating at rest or with exertion. She also noted exertional dyspnea. Overdue follow up was scheduled.   She also called PCP on 06/12/24 and reports vision changes with color distortion. She saw ophthalmology 06/17/24 with good report.   Seen 06/19/24 with chest pain/burning occurring daily after lunch relieved by pepto bismol and alka seltzer as well as burning in chest also occurs with walking x2 weeks.  She noted exertional dyspnea, abdominal swelling/fullness and bilateral LE edema in evenings.  Subsequent lexiscan  Myoview  06/22/2024 low risk study with large defect with severe reduction in uptake present in mid to basal lateral locations that was fixed consistent with infarction but no evidence of ischemia.  Subsequent echo 07/17/2024 normal LVEF 60 to 65%, no RWMA, moderate LVH, grade 2 diastolic dysfunction, RV normal, moderately elevated PASP, severe LAE, mild RAE, small pericardial effusion without tamponade, trivial MR.  ED visit 07/11/2024 after multiple ant bites treated with a course of steroids  as well as ABX to prevent development of cellulitis.  ED visit 07/16/2024 with weakness, fatigue, headache for 2 weeks.  Troponin X2 elevated but stable 86 ? 78.   She saw endocrinology 07/24/24 and Actos  was discontinued due to edema.   Presents today for follow up independently. Of note, she was in the ED earlier today due to URi symptoms with testing negative for COVID/flu/RSV. Reports her breathing Goes and comes. Reports breathing is somewhat better since addition of Lasix . She does have bilateral feet and ankle swelling worse by the end of the day. She sleeps on 2 pillows which she has done for some time, orthopnea has improved since addition of Lasix . No PND.  No chest pain.  ROS: Please see the history of present illness.    All other systems reviewed and are negative.   Studies Reviewed      Cardiac Studies & Procedures   ______________________________________________________________________________________________   STRESS TESTS  MYOCARDIAL PERFUSION IMAGING 06/22/2024  Interpretation Summary   Findings are consistent with infarction. The study is low risk.   No ST deviation was noted.   LV perfusion is abnormal. Defect 1: There is a large defect with severe reduction in uptake present in the mid to basal lateral location(s) that is fixed. There is abnormal wall motion in the defect area. Consistent with infarction.   Left ventricular function is normal. Nuclear stress EF: 66%. The left ventricular ejection fraction is hyperdynamic (>65%). End diastolic cavity size is mildly enlarged. End systolic cavity size is normal.   CT images were  obtained for attenuation correction and were examined for the presence of coronary calcium  when appropriate.   Coronary calcium  assessment not performed due to prior revascularization. History of prior stents   Prior study available for comparison from 09/18/2018.   Suggest echo correlation for EF given extent of infarct ? overestimation of EF  from small cavity size and non tracking of lateral wall   ECHOCARDIOGRAM  ECHOCARDIOGRAM COMPLETE 07/17/2024  Narrative ECHOCARDIOGRAM REPORT    Patient Name:   [name] Date of Exam: 07/17/2024 Medical Rec #:  [id number]         Height:       65.0 in Accession #:    [id number]        Weight:       179.7 lb Date of Birth:  04-21-1946          BSA:          1.890 m Patient Age:    78 years          BP:           160/70 mmHg Patient Gender: F                 HR:           64 bpm. Exam Location:  Outpatient  Procedure: 2D Echo, 3D Echo, Cardiac Doppler, Color Doppler and Strain Analysis (Both Spectral and Color Flow Doppler were utilized during procedure).  Indications:    R53.83 Fatigue; R06.9 DOE  History:        Patient has prior history of Echocardiogram examinations, most recent 02/19/2016. CAD, Arrythmias:RBBB; Risk Factors:Hypertension, Dyslipidemia, Diabetes, Former Smoker and CKD stage 3a. Patient denies chest pain and leg edema. She complains of DOE and overall fatigue.  Sonographer:    Annabella Cater RVT, RDCS (AE), RDMS Referring Phys: [phone] Maycee Blasco S Salahuddin Arismendez  IMPRESSIONS   1. Flow acceleration in the outflow tract concerning for subaortic membrane. Left ventricular ejection fraction, by estimation, is 60 to 65%. Left ventricular ejection fraction by 3D volume is 60 %. The left ventricle has normal function. The left ventricle has no regional wall motion abnormalities. There is moderate left ventricular hypertrophy. Left ventricular diastolic parameters are consistent with Grade II diastolic dysfunction (pseudonormalization). Elevated left atrial pressure. The average left ventricular global longitudinal strain is -11.6 %. The global longitudinal strain is abnormal. 2. Right ventricular systolic function is normal. The right ventricular size is normal. There is moderately elevated pulmonary artery systolic pressure. 3. Left atrial size was severely dilated. 4.  Right atrial size was mildly dilated. 5. A small pericardial effusion is present. The pericardial effusion is anterior to the right ventricle. There is no evidence of cardiac tamponade. 6. The mitral valve is normal in structure. Trivial mitral valve regurgitation. No evidence of mitral stenosis. 7. The aortic valve is normal in structure. Aortic valve regurgitation is not visualized. Aortic valve sclerosis/calcification is present, without any evidence of aortic stenosis. 8. The inferior vena cava is dilated in size with >50% respiratory variability, suggesting right atrial pressure of 8 mmHg.  Comparison(s): A prior study was performed on 02/19/2016. The ejection fraction was 60-65% with grade I diastolic dysfunction. There was mild mitral regurgitation. No pericardial effusion noted.  FINDINGS Left Ventricle: Flow acceleration in the outflow tract concerning for subaortic membrane. Left ventricular ejection fraction, by estimation, is 60 to 65%. Left ventricular ejection fraction by 3D volume is 60 %. The left ventricle has normal function. The left ventricle has  no regional wall motion abnormalities. The average left ventricular global longitudinal strain is -11.6 %. Strain was performed and the global longitudinal strain is abnormal. The left ventricular internal cavity size was normal in size. There is moderate left ventricular hypertrophy. Left ventricular diastolic parameters are consistent with Grade II diastolic dysfunction (pseudonormalization). Elevated left atrial pressure.  Right Ventricle: The right ventricular size is normal. No increase in right ventricular wall thickness. Right ventricular systolic function is normal. There is moderately elevated pulmonary artery systolic pressure. The tricuspid regurgitant velocity is 3.21 m/s, and with an assumed right atrial pressure of 8 mmHg, the estimated right ventricular systolic pressure is 49.2 mmHg.  Left Atrium: Left atrial size was  severely dilated.  Right Atrium: Right atrial size was mildly dilated.  Pericardium: A small pericardial effusion is present. The pericardial effusion is anterior to the right ventricle. The pericardial effusion appears to contain fibrous material. There is no evidence of cardiac tamponade.  Mitral Valve: The mitral valve is normal in structure. Trivial mitral valve regurgitation. No evidence of mitral valve stenosis.  Tricuspid Valve: The tricuspid valve is normal in structure. Tricuspid valve regurgitation is mild . No evidence of tricuspid stenosis.  Aortic Valve: The aortic valve is normal in structure. Aortic valve regurgitation is not visualized. Aortic valve sclerosis/calcification is present, without any evidence of aortic stenosis. Aortic valve mean gradient measures 4.0 mmHg. Aortic valve peak gradient measures 7.0 mmHg. Aortic valve area, by VTI measures 1.71 cm.  Pulmonic Valve: The pulmonic valve was normal in structure. Pulmonic valve regurgitation is not visualized. No evidence of pulmonic stenosis.  Aorta: The aortic root and ascending aorta are structurally normal, with no evidence of dilitation.  Venous: The inferior vena cava is dilated in size with greater than 50% respiratory variability, suggesting right atrial pressure of 8 mmHg.  IAS/Shunts: No atrial level shunt detected by color flow Doppler.  Additional Comments: 3D was performed not requiring image post processing on an independent workstation and was normal.   LEFT VENTRICLE PLAX 2D LVIDd:         4.31 cm         Diastology LVIDs:         2.63 cm         LV e' medial:    3.43 cm/s LV PW:         1.46 cm         LV E/e' medial:  22.1 LV IVS:        1.42 cm         LV e' lateral:   3.77 cm/s LVOT diam:     1.90 cm         LV E/e' lateral: 20.1 LV SV:         53 LV SV Index:   28              2D Longitudinal LVOT Area:     2.84 cm        Strain 2D Strain GLS   -11.6 % Avg:  3D Volume EF LV 3D EF:     Left ventricul ar ejection fraction by 3D volume is 60 %.  3D Volume EF: 3D EF:        60 % LV EDV:       117 ml LV ESV:       46 ml LV SV:        70 ml  RIGHT VENTRICLE RV S prime:  10.40 cm/s  PULMONARY VEINS TAPSE (M-mode): 2.2 cm      Diastolic Velocity: 47.80 cm/s S/D Velocity:       1.20 Systolic Velocity:  58.80 cm/s  LEFT ATRIUM             Index        RIGHT ATRIUM           Index LA diam:        4.80 cm 2.54 cm/m   RA Area:     17.90 cm LA Vol (A2C):   94.8 ml 50.16 ml/m  RA Volume:   53.90 ml  28.52 ml/m LA Vol (A4C):   77.3 ml 40.90 ml/m LA Biplane Vol: 89.8 ml 47.51 ml/m AORTIC VALVE                    PULMONIC VALVE AV Area (Vmax):    1.55 cm     PV Vmax:       0.93 m/s AV Area (Vmean):   1.60 cm     PV Peak grad:  3.4 mmHg AV Area (VTI):     1.71 cm AV Vmax:           132.00 cm/s AV Vmean:          90.600 cm/s AV VTI:            0.308 m AV Peak Grad:      7.0 mmHg AV Mean Grad:      4.0 mmHg LVOT Vmax:         72.20 cm/s LVOT Vmean:        51.200 cm/s LVOT VTI:          0.186 m LVOT/AV VTI ratio: 0.60  AORTA Ao Root diam: 3.40 cm Ao Asc diam:  3.40 cm Ao Arch diam: 3.5 cm  MITRAL VALVE               TRICUSPID VALVE MV Area (PHT): 3.87 cm    TR Peak grad:   41.2 mmHg MV Decel Time: 196 msec    TR Vmax:        321.00 cm/s MV E velocity: 75.70 cm/s MV A velocity: 63.60 cm/s  SHUNTS MV E/A ratio:  1.19        Systemic VTI:  0.19 m Systemic Diam: 1.90 cm  Emeline Calender Electronically signed by Emeline Calender Signature Date/Time: 07/17/2024/4:46:10 PM    Final          ______________________________________________________________________________________________        Risk Assessment/Calculations          Physical Exam VS:  BP 138/62 (BP Location: Right Arm, Patient Position: Sitting, Cuff Size: Large)   Pulse 63   Ht 5' 5 (1.651 m)   Wt 194 lb 8 oz (88.2 kg)   SpO2 99%   BMI 32.37 kg/m        Wt Readings from Last  3 Encounters:  08/03/24 194 lb 8 oz (88.2 kg)  07/24/24 197 lb (89.4 kg)  07/16/24 179 lb 10.8 oz (81.5 kg)    BP Readings from Last 3 Encounters:  08/03/24 (!) 160/61  08/03/24 138/62  07/24/24 120/74     GEN: Well nourished, overweight, well developed in no acute distress NECK: No JVD; No carotid bruits CARDIAC: RRR, no murmurs, rubs, gallops; radial and DP pulses 2+ RESPIRATORY:  Clear to auscultation without rales, wheezing or rhonchi  ABDOMEN: Soft, non-tender, non-distended EXTREMITIES:  Nonpitting pretibial edema; No deformity  ASSESSMENT AND PLAN  CAD/Dyspnea - Prior stenting with unknown vessel and stent type. Low risk myoview  with infarct but no ischemia. Continue ASA 81mg  daily, atorvastatin  80mg  daily, metoprolol  succinate 50mg  daily, valsartan  320mg  daily  Diastolic heart failure / Pulmonary hypertension / ?Subaortic membrane? - Echo 07/17/24 LVEF 60-65%, gr2dd, moderate LVH, RV normal size/function, moderately elevated PASP.  Rx farixga 10mg  daily. Sample provided.  Of note previously did not tolerate Jardiance  with oral thrush, as discussed with endocrinology low suspicion this was adverse reaction from Jardiance .  She was advised to hydrate well on Farxiga. Cmet in 1-2 weeks for monitoring. Continue Toprol  50 mg daily.  Could consider Kerendia at follow-up pending response to Farxiga.  HLD, LDL goal <70 - 07/2023 LDL 60. Continue atorvastatin  80mg  daily, fenofibrate  160mg  daily  CMET, lipid panel in 1-2 weeks. Defer today as not fasting.   HTN -Mildly elevated today in setting of URI. Continue amlodipine  10mg  daily, metoprolol  succinate 50mg  daily, valsartan  320mg  daily, Lasix  20mg  daily. Discussed to monitor BP at home at least 2 hours after medications and sitting for 5-10 minutes.  Anticipate BP control will improve with addition of Farxiga, as above.   DM2 - follows with endocrinology.             Dispo: follow up in 3 months  Signed, Reche GORMAN Finder,  NP

## 2024-08-03 NOTE — ED Triage Notes (Signed)
 Cough, congestion, and body aches x 3 days. Denies fevers.

## 2024-08-03 NOTE — ED Provider Notes (Signed)
 [place] EMERGENCY DEPARTMENT AT [facility] Provider Note   CSN: [id number] Arrival date & time: 08/03/24  1247     Patient presents with: Cough   [name] is a 78 y.o. female.   HPI   78 year old female presents emergency department with cough, congestion and bodyaches for the past week, worsening over the past 2 to 3 days.  She states that she has felt febrile but no documented fever.  Endorses chills, weakness and decreased appetite.  States that her cough is intermittently productive of yellow phlegm, denies any blood.  No chest pain, no swelling of her lower extremities.  Prior to Admission medications   Medication Sig Start Date End Date Taking? Authorizing Provider  azithromycin  (ZITHROMAX  Z-PAK) 250 MG tablet Take 1 tablet (250 mg total) by mouth daily. Take as directed 08/03/24  Yes Mckenzie Toruno, Roxie HERO, DO  dextromethorphan-guaiFENesin  (MUCINEX  DM) 30-600 MG 12hr tablet Take 1 tablet by mouth 2 (two) times daily. 08/03/24  Yes Aleece Loyd, Roxie HERO, DO  Alcohol Swabs (B-D SINGLE USE SWABS REGULAR) PADS USE AS DIRECTED 03/15/20   Antonio Meth, Jamee SAUNDERS, DO  alendronate  (FOSAMAX ) 70 MG tablet TAKE ONE TABLET BY MOUTH ONCE WEEKLY ON WEDNESDAY 05/05/24   Lowne Chase, Yvonne R, DO  ALPRAZolam  (XANAX ) 0.5 MG tablet Take 1 tablet (0.5 mg total) by mouth 2 (two) times daily as needed for anxiety. 06/23/24   Antonio Meth Jamee SAUNDERS, DO  amLODipine  (NORVASC ) 10 MG tablet Take 1 tablet (10 mg total) by mouth daily. 05/05/24   Antonio Meth Jamee SAUNDERS, DO  aspirin  EC 81 MG tablet Take 81 mg by mouth daily.    [provider]  atorvastatin  (LIPITOR) 80 MG tablet Take 1 tablet (80 mg total) by mouth every evening. 03/04/24   Antonio Meth, Jamee SAUNDERS, DO  buPROPion  (WELLBUTRIN  XL) 300 MG 24 hr tablet Take 1 tablet (300 mg total) by mouth daily. 07/12/23   Lowne Chase, Yvonne R, DO  Carboxymethylcellulose Sodium (THERATEARS OP) Place 1 drop into both eyes daily as needed (dry eyes).     [provider]  cephALEXin (KEFLEX) 500 MG capsule Take 1 capsule (500 mg total) by mouth 2 (two) times daily. 07/11/24   Doretha Folks, MD  Ciclopirox  1 % shampoo Massage into scalp 2x a week for 4 weeks 03/09/24   Antonio Meth, Jamee SAUNDERS, DO  Continuous Glucose Receiver (DEXCOM G6 RECEIVER) DEVI Use to monitor glucose 01/09/24   Shamleffer, Donell Cardinal, MD  Continuous Glucose Sensor (DEXCOM G7 SENSOR) MISC 1 Device by Does not apply route as directed. Every 10 days 07/24/24   Shamleffer, Donell Cardinal, MD  Continuous Glucose Transmitter (DEXCOM G6 TRANSMITTER) MISC REPLACE TRANSMITTER EVERY 90 DAYS 01/09/24   Shamleffer, Ibtehal Jaralla, MD  esomeprazole  (NEXIUM ) 40 MG capsule Take 1 capsule (40 mg total) by mouth daily. 03/11/24   Antonio Meth Jamee SAUNDERS, DO  fenofibrate  160 MG tablet Take 1 tablet (160 mg total) by mouth every evening. 05/05/24   Antonio Meth, Yvonne R, DO  ferrous sulfate  325 (65 FE) MG tablet Take 325 mg by mouth daily with breakfast.     [provider]  fexofenadine (ALLEGRA) 180 MG tablet Take 180 mg by mouth daily as needed for allergies or rhinitis.    [provider]  FLUoxetine  (PROZAC ) 20 MG capsule 3 po every day 06/23/24   Antonio Meth, Yvonne R, DO  fluticasone  (FLONASE ) 50 MCG/ACT nasal spray INSTILL 2 SPRAYS IN EACH NOSTRIL DAILY 01/14/23  Antonio Meth, Yvonne R, DO  furosemide  (LASIX ) 20 MG tablet TAKE 1 TABLET BY MOUTH DAILY 07/21/24   Walker, Caitlin S, NP  glucose blood (CONTOUR NEXT TEST) test strip Use to check blood sugar up to three times daily 04/29/24   Shamleffer, Ibtehal Jaralla, MD  glucose blood (TRUE METRIX BLOOD GLUCOSE TEST) test strip USE AS DIRECTED 4 TIMES A DAY 10/27/19   Antonio Meth Jamee JONELLE, DO  insulin  aspart (NOVOLOG  FLEXPEN) 100 UNIT/ML FlexPen Max daily 70 units 05/21/23   Shamleffer, Ibtehal Jaralla, MD  Insulin  Disposable Pump (OMNIPOD 5 G7 INTRO, GEN 5,) KIT 1 Device by Does not apply route every other day. 03/31/24    Shamleffer, Donell Cardinal, MD  Insulin  Disposable Pump (OMNIPOD 5 G7 PODS, GEN 5,) MISC 1 Device by Does not apply route every 3 (three) days. 03/31/24   Shamleffer, Ibtehal Jaralla, MD  Insulin  Pen Needle (COMFORT EZ PEN NEEDLES) 32G X 4 MM MISC 1 Device by Other route in the morning, at noon, in the evening, and at bedtime. as directed 05/21/23   Shamleffer, Donell Cardinal, MD  Iron , Ferrous Sulfate , 325 (65 Fe) MG TABS Take 325 mg by mouth daily. 06/26/24   Antonio Meth Jamee R, DO  metoprolol  succinate (TOPROL -XL) 50 MG 24 hr tablet TAKE ONE TABLET BY MOUTH ONCE DAILY WITH FOOD 05/05/24   Lowne Chase, Yvonne R, DO  Multiple Vitamin (MULTIVITAMIN WITH MINERALS) TABS Take 1 tablet by mouth daily.    [provider]  Omega-3 Fatty Acids (FISH OIL) 1000 MG CAPS Take 1 capsule by mouth in the morning and at bedtime.    [provider]  omeprazole  (PRILOSEC) 20 MG capsule Take 1 capsule (20 mg total) by mouth daily. 06/23/24   Antonio Meth Jamee JONELLE, DO  predniSONE  (DELTASONE ) 20 MG tablet Take 2 tablets (40 mg total) by mouth daily. 07/11/24   Doretha Folks, MD  valsartan  (DIOVAN ) 320 MG tablet Take 1 tablet (320 mg total) by mouth every morning. 05/05/24   Antonio Meth, Yvonne R, DO  Vitamin D , Ergocalciferol , (DRISDOL ) 1.25 MG (50000 UNIT) CAPS capsule Take 1 capsule (50,000 Units total) by mouth every 7 (seven) days. 03/11/24   Antonio Meth Jamee JONELLE, DO  vitamin E  400 UNIT capsule Take 400 Units by mouth daily.    [provider]    Allergies: Metformin and related, Levemir [insulin  detemir], Clindamycin, and Crestor  [rosuvastatin  calcium ]    Review of Systems  Constitutional:  Negative for fever.  Respiratory:  Negative for shortness of breath.   Cardiovascular:  Negative for chest pain.  Gastrointestinal:  Negative for abdominal pain, diarrhea and vomiting.  Skin:  Negative for rash.  Neurological:  Negative for headaches.    Updated Vital Signs BP (!) 160/61 (BP  Location: Right Arm)   Pulse 67   Temp 98.4 F (36.9 C) (Oral)   Resp 17   SpO2 98%   Physical Exam Vitals and nursing note reviewed.  Constitutional:      General: She is not in acute distress.    Appearance: Normal appearance.  HENT:     Head: Normocephalic.     Mouth/Throat:     Mouth: Mucous membranes are moist.  Cardiovascular:     Rate and Rhythm: Normal rate.  Pulmonary:     Effort: Pulmonary effort is normal. No respiratory distress.     Breath sounds: Wheezing present.  Abdominal:     Palpations: Abdomen is soft.     Tenderness: There is  no abdominal tenderness.  Skin:    General: Skin is warm.  Neurological:     Mental Status: She is alert and oriented to person, place, and time. Mental status is at baseline.  Psychiatric:        Mood and Affect: Mood normal.     (all labs ordered are listed, but only abnormal results are displayed) Labs Reviewed  RESP PANEL BY RT-PCR (RSV, FLU A&B, COVID)  [id number]    EKG: None  Radiology: No results found.   Procedures   Medications Ordered in the ED - No data to display                                  Medical Decision Making Risk OTC drugs. Prescription drug management.   78 year old female presents emergency department for week long symptoms including generalized bodyaches, productive cough, congestion, subjective fever/chills.  Recent sick contact with her granddaughter who had similar symptoms, URI.  Vitals are normal and stable on arrival.  She is in no respiratory distress.  She has scattered wheezes and decreased breath sounds but no focal finding on lung auscultation.  Respiratory panel is negative.  Discussed with patient treatment of URI, presumed bronchitis.  Offered a chest x-ray for further evaluation however the patient has an appointment upstairs that she needs to get to, she declines.  Low suspicion for acute pneumonia at this time.  Will treat with medication and refer outpatient.  Patient at  this time appears safe and stable for discharge and close outpatient follow up. Discharge plan and strict return to ED precautions discussed, patient verbalizes understanding and agreement.     Final diagnoses:  Upper respiratory tract infection, unspecified type    ED Discharge Orders          Ordered    azithromycin  (ZITHROMAX  Z-PAK) 250 MG tablet  Daily        08/03/24 1420    dextromethorphan-guaiFENesin  (MUCINEX  DM) 30-600 MG 12hr tablet  2 times daily        08/03/24 1421               Dago Jungwirth M, DO 08/03/24 1503

## 2024-08-03 NOTE — Telephone Encounter (Signed)
   Ran test claim for farxiga. For a 30 day supply and the co-pay is 0.00 . PA is not needed at this time. Nothing saying this is a transition fill.   This test claim was processed through [facility]- copay amounts may vary at other pharmacies due to pharmacy/plan contracts, or as the patient moves through the different stages of their insurance plan.

## 2024-08-04 ENCOUNTER — Ambulatory Visit (INDEPENDENT_AMBULATORY_CARE_PROVIDER_SITE_OTHER): Admitting: Family Medicine

## 2024-08-04 VITALS — BP 173/66 | HR 79 | Temp 98.1°F | Resp 14 | Ht 65.0 in | Wt 196.4 lb

## 2024-08-04 DIAGNOSIS — J0140 Acute pansinusitis, unspecified: Secondary | ICD-10-CM

## 2024-08-04 DIAGNOSIS — J9801 Acute bronchospasm: Secondary | ICD-10-CM | POA: Insufficient documentation

## 2024-08-04 MED ORDER — FLUTICASONE-SALMETEROL 115-21 MCG/ACT IN AERO: 2.0000 | INHALATION_SPRAY | Freq: Two times a day (BID) | RESPIRATORY_TRACT | 12 refills | Status: AC

## 2024-08-04 MED ORDER — AMOXICILLIN-POT CLAVULANATE 875-125 MG PO TABS: 1.0000 | ORAL_TABLET | Freq: Two times a day (BID) | ORAL | 0 refills | Status: DC

## 2024-08-04 MED ORDER — PROMETHAZINE-DM 6.25-15 MG/5ML PO SYRP: 5.0000 mL | ORAL_SOLUTION | Freq: Four times a day (QID) | ORAL | 0 refills | Status: DC | PRN

## 2024-08-04 MED ORDER — ALBUTEROL SULFATE HFA 108 (90 BASE) MCG/ACT IN AERS: 2.0000 | INHALATION_SPRAY | Freq: Four times a day (QID) | RESPIRATORY_TRACT | 0 refills | Status: DC | PRN

## 2024-08-04 NOTE — Assessment & Plan Note (Signed)
 Albuterol  inh Advair inh

## 2024-08-04 NOTE — Progress Notes (Signed)
 --+     Subjective:    Patient ID: [name], female    DOB: [birth year], 78 y.o.   MRN: [id number]  Chief Complaint  Patient presents with   follow up ER visit    Cough is worse     HPI Patient is in today for f/u er.  Discussed the use of AI scribe software for clinical note transcription with the patient, who gave verbal consent to proceed.  History of Present Illness [name] is a 78 year old female who presents with persistent cough and respiratory symptoms.  She has been experiencing respiratory symptoms since last Wednesday, with her granddaughter also having similar symptoms. She visited the ER and was prescribed a Z-Pak, which she started last night. Despite this, she continues to have significant coughing, especially at night, along with mucus production and a tingling sensation in her throat.  She also experiences head pressure and chest tightness. She is currently taking Mucinex  over the counter to manage her symptoms and uses Flonase  at home for nasal congestion. She has not used inhalers before and is unfamiliar with their use.  Regarding her diabetes, her blood sugar levels have been better, although they occasionally spike but do not remain elevated for long.    Past Medical History:  Diagnosis Date   Blockage of coronary artery of heart (HCC)    Depression    Diabetes mellitus    Hyperlipidemia    Hypertension    Pancreatitis    Restless leg syndrome     Past Surgical History:  Procedure Laterality Date   CATARACT EXTRACTION, BILATERAL     CORONARY ANGIOPLASTY WITH STENT PLACEMENT     FOOT SURGERY Right    cyst removal   ROTATOR CUFF REPAIR     right   ROTATOR CUFF REPAIR Left    TUBAL LIGATION      Family History  Problem Relation Age of Onset   Heart attack Father    Heart attack Brother    Diabetes Sister    Cirrhosis Sister    Hypertension Sister    Cancer Mother    Hypertension Mother    Vision loss Mother    Cancer Sister     Sleep apnea Sister    Heart Problems Sister    Diabetes Sister    Gout Sister    Diabetes Son    Heart attack Son    Diabetes Daughter    Goiter Daughter    Diabetes Daughter     Social History   Socioeconomic History   Marital status: Married    Spouse name: Not on file   Number of children: Not on file   Years of education: Not on file   Highest education level: Not on file  Occupational History   Not on file  Tobacco Use   Smoking status: Former    Current packs/day: 0.00    Types: Cigarettes    Quit date: 05/05/2020    Years since quitting: 4.2    Passive exposure: Current (and in the past as well)   Smokeless tobacco: Never   Tobacco comments:    quit in 2021  Vaping Use   Vaping status: Never Used  Substance and Sexual Activity   Alcohol use: No   Drug use: No   Sexual activity: Not Currently  Other Topics Concern   Not on file  Social History Narrative   Exercise: seldom   Caffeine use: seldom   Social Drivers of Health  Financial Resource Strain: Low Risk  (06/09/2024)   Overall Financial Resource Strain (CARDIA)    Difficulty of Paying Living Expenses: Not very hard  Food Insecurity: No Food Insecurity (06/09/2024)   Hunger Vital Sign    Worried About Running Out of Food in the Last Year: Never true    Ran Out of Food in the Last Year: Never true  Transportation Needs: No Transportation Needs (06/09/2024)   PRAPARE - Administrator, Civil Service (Medical): No    Lack of Transportation (Non-Medical): No  Physical Activity: Inactive (06/09/2024)   Exercise Vital Sign    Days of Exercise per Week: 0 days    Minutes of Exercise per Session: 0 min  Stress: No Stress Concern Present (06/09/2024)   [employer] of Occupational Health - Occupational Stress Questionnaire    Feeling of Stress: Not at all  Social Connections: Moderately Integrated (06/09/2024)   Social Connection and Isolation Panel    Frequency of Communication with Friends and  Family: More than three times a week    Frequency of Social Gatherings with Friends and Family: More than three times a week    Attends Religious Services: 1 to 4 times per year    Active Member of [employer] or Organizations: No    Attends Banker Meetings: Never    Marital Status: Married  Catering Manager Violence: Not At Risk (06/09/2024)   Humiliation, Afraid, Rape, and Kick questionnaire    Fear of Current or Ex-Partner: No    Emotionally Abused: No    Physically Abused: No    Sexually Abused: No    Outpatient Medications Prior to Visit  Medication Sig Dispense Refill   Alcohol Swabs (B-D SINGLE USE SWABS REGULAR) PADS USE AS DIRECTED 400 each 1   alendronate  (FOSAMAX ) 70 MG tablet TAKE ONE TABLET BY MOUTH ONCE WEEKLY ON WEDNESDAY 12 tablet 3   ALPRAZolam  (XANAX ) 0.5 MG tablet Take 1 tablet (0.5 mg total) by mouth 2 (two) times daily as needed for anxiety. 60 tablet 1   amLODipine  (NORVASC ) 10 MG tablet Take 1 tablet (10 mg total) by mouth daily. 90 tablet 3   aspirin  EC 81 MG tablet Take 81 mg by mouth daily.     atorvastatin  (LIPITOR) 80 MG tablet Take 1 tablet (80 mg total) by mouth every evening. 90 tablet 1   azithromycin  (ZITHROMAX  Z-PAK) 250 MG tablet Take 1 tablet (250 mg total) by mouth daily. Take as directed 6 tablet 0   buPROPion  (WELLBUTRIN  XL) 300 MG 24 hr tablet Take 1 tablet (300 mg total) by mouth daily. 90 tablet 3   Carboxymethylcellulose Sodium (THERATEARS OP) Place 1 drop into both eyes daily as needed (dry eyes).     Ciclopirox  1 % shampoo Massage into scalp 2x a week for 4 weeks 120 mL 0   Continuous Glucose Receiver (DEXCOM G6 RECEIVER) DEVI Use to monitor glucose 1 each 0   Continuous Glucose Sensor (DEXCOM G7 SENSOR) MISC 1 Device by Does not apply route as directed. Every 10 days 9 each 3   Continuous Glucose Transmitter (DEXCOM G6 TRANSMITTER) MISC REPLACE TRANSMITTER EVERY 90 DAYS 1 each 3   dapagliflozin  propanediol (FARXIGA ) 10 MG TABS tablet  Take 1 tablet (10 mg total) by mouth daily before breakfast. 30 tablet 5   dapagliflozin  propanediol (FARXIGA ) 10 MG TABS tablet Take 1 tablet (10 mg total) by mouth daily before breakfast. 14 tablet 0   esomeprazole  (NEXIUM ) 40 MG capsule Take 1  capsule (40 mg total) by mouth daily. 90 capsule 3   fenofibrate  160 MG tablet Take 1 tablet (160 mg total) by mouth every evening. 90 tablet 3   ferrous sulfate  325 (65 FE) MG tablet Take 325 mg by mouth daily with breakfast.      fexofenadine (ALLEGRA) 180 MG tablet Take 180 mg by mouth daily as needed for allergies or rhinitis.     fluticasone  (FLONASE ) 50 MCG/ACT nasal spray INSTILL 2 SPRAYS IN EACH NOSTRIL DAILY 48 g 1   furosemide  (LASIX ) 20 MG tablet TAKE 1 TABLET BY MOUTH DAILY 90 tablet 3   glucose blood (CONTOUR NEXT TEST) test strip Use to check blood sugar up to three times daily 100 each 12   glucose blood (TRUE METRIX BLOOD GLUCOSE TEST) test strip USE AS DIRECTED 4 TIMES A DAY 400 strip 1   insulin  aspart (NOVOLOG  FLEXPEN) 100 UNIT/ML FlexPen Max daily 70 units 60 mL 6   Insulin  Disposable Pump (OMNIPOD 5 G7 INTRO, GEN 5,) KIT 1 Device by Does not apply route every other day. 1 kit 0   Insulin  Disposable Pump (OMNIPOD 5 G7 PODS, GEN 5,) MISC 1 Device by Does not apply route every 3 (three) days. 45 each 3   Insulin  Pen Needle (COMFORT EZ PEN NEEDLES) 32G X 4 MM MISC 1 Device by Other route in the morning, at noon, in the evening, and at bedtime. as directed 400 each 3   Iron , Ferrous Sulfate , 325 (65 Fe) MG TABS Take 325 mg by mouth daily. 30 tablet 2   metoprolol  succinate (TOPROL -XL) 50 MG 24 hr tablet TAKE ONE TABLET BY MOUTH ONCE DAILY WITH FOOD 90 tablet 3   Multiple Vitamin (MULTIVITAMIN WITH MINERALS) TABS Take 1 tablet by mouth daily.     Omega-3 Fatty Acids (FISH OIL) 1000 MG CAPS Take 1 capsule by mouth in the morning and at bedtime.     omeprazole  (PRILOSEC) 20 MG capsule Take 1 capsule (20 mg total) by mouth daily. 90 capsule 3    valsartan  (DIOVAN ) 320 MG tablet Take 1 tablet (320 mg total) by mouth every morning. 90 tablet 3   Vitamin D , Ergocalciferol , (DRISDOL ) 1.25 MG (50000 UNIT) CAPS capsule Take 1 capsule (50,000 Units total) by mouth every 7 (seven) days. 12 capsule 1   vitamin E  400 UNIT capsule Take 400 Units by mouth daily.     dextromethorphan-guaiFENesin  (MUCINEX  DM) 30-600 MG 12hr tablet Take 1 tablet by mouth 2 (two) times daily. (Patient not taking: Reported on 08/03/2024) 15 tablet 0   No facility-administered medications prior to visit.    Allergies  Allergen Reactions   Metformin And Related Other (See Comments)    Sick on stomach, sweaty, throw up, pain   Levemir [Insulin  Detemir] Hives and Swelling   Clindamycin Nausea And Vomiting   Crestor  [Rosuvastatin  Calcium ] Palpitations    Review of Systems  Constitutional:  Negative for fever and malaise/fatigue.  HENT:  Positive for congestion and sinus pain.   Eyes:  Negative for blurred vision.  Respiratory:  Positive for cough, sputum production, shortness of breath and wheezing.   Cardiovascular:  Negative for chest pain, palpitations and leg swelling.  Gastrointestinal:  Negative for abdominal pain, blood in stool and nausea.  Genitourinary:  Negative for dysuria and frequency.  Musculoskeletal:  Negative for falls.  Skin:  Negative for rash.  Neurological:  Negative for dizziness, loss of consciousness and headaches.  Endo/Heme/Allergies:  Negative for environmental allergies.  Psychiatric/Behavioral:  Negative for depression. The patient is not nervous/anxious.        Objective:    Physical Exam Vitals and nursing note reviewed.  Constitutional:      General: She is not in acute distress.    Appearance: Normal appearance. She is well-developed.  HENT:     Head: Normocephalic and atraumatic.     Nose: Congestion present.     Right Sinus: Maxillary sinus tenderness and frontal sinus tenderness present.     Left Sinus: Maxillary  sinus tenderness and frontal sinus tenderness present.  Eyes:     General: No scleral icterus.       Right eye: No discharge.        Left eye: No discharge.  Cardiovascular:     Rate and Rhythm: Normal rate and regular rhythm.     Heart sounds: No murmur heard. Pulmonary:     Effort: Pulmonary effort is normal. No respiratory distress.     Breath sounds: Decreased air movement present. Decreased breath sounds and wheezing present.  Musculoskeletal:        General: Normal range of motion.     Cervical back: Normal range of motion and neck supple.     Right lower leg: No edema.     Left lower leg: No edema.  Skin:    General: Skin is warm and dry.  Neurological:     Mental Status: She is alert and oriented to person, place, and time.  Psychiatric:        Mood and Affect: Mood normal.        Behavior: Behavior normal.        Thought Content: Thought content normal.        Judgment: Judgment normal.     BP (!) 173/66 (BP Location: Right Arm, Patient Position: Sitting, Cuff Size: Large)   Pulse 79   Temp 98.1 F (36.7 C) (Oral)   Resp 14   Ht 5' 5 (1.651 m)   Wt 196 lb 6.4 oz (89.1 kg)   SpO2 97%   BMI 32.68 kg/m  Wt Readings from Last 3 Encounters:  08/04/24 196 lb 6.4 oz (89.1 kg)  08/03/24 194 lb 8 oz (88.2 kg)  07/24/24 197 lb (89.4 kg)    Diabetic Foot Exam - Simple   No data filed    Lab Results  Component Value Date   WBC 6.1 07/28/2024   HGB 11.1 (L) 07/28/2024   HCT 33.6 (L) 07/28/2024   PLT 159.0 07/28/2024   GLUCOSE 107 (H) 07/16/2024   CHOL 120 07/12/2023   TRIG 163 (H) 07/12/2023   HDL 36 (L) 07/12/2023   LDLDIRECT 53.0 04/24/2021   LDLCALC 60 07/12/2023   ALT 16 06/23/2024   AST 18 06/23/2024   NA 141 07/16/2024   K 3.9 07/16/2024   CL 105 07/16/2024   CREATININE 0.80 07/16/2024   BUN 21 07/16/2024   CO2 28 07/16/2024   TSH 0.91 03/09/2024   INR 1.1 03/03/2009   HGBA1C 7.9 (A) 07/24/2024   MICROALBUR 9.9 07/12/2023    Lab Results   Component Value Date   TSH 0.91 03/09/2024   Lab Results  Component Value Date   WBC 6.1 07/28/2024   HGB 11.1 (L) 07/28/2024   HCT 33.6 (L) 07/28/2024   MCV 90.8 07/28/2024   PLT 159.0 07/28/2024   Lab Results  Component Value Date   NA 141 07/16/2024   K 3.9 07/16/2024   CO2 28 07/16/2024   GLUCOSE 107 (  H) 07/16/2024   BUN 21 07/16/2024   CREATININE 0.80 07/16/2024   BILITOT 0.5 06/23/2024   ALKPHOS 37 (L) 06/23/2024   AST 18 06/23/2024   ALT 16 06/23/2024   PROT 6.3 06/23/2024   ALBUMIN 4.1 06/23/2024   CALCIUM  10.2 07/16/2024   ANIONGAP 8 07/16/2024   EGFR 51 (L) 07/08/2024   GFR 51.50 (L) 06/23/2024   Lab Results  Component Value Date   CHOL 120 07/12/2023   Lab Results  Component Value Date   HDL 36 (L) 07/12/2023   Lab Results  Component Value Date   LDLCALC 60 07/12/2023   Lab Results  Component Value Date   TRIG 163 (H) 07/12/2023   Lab Results  Component Value Date   CHOLHDL 3.3 07/12/2023   Lab Results  Component Value Date   HGBA1C 7.9 (A) 07/24/2024       Assessment & Plan:  Acute non-recurrent pansinusitis -     Promethazine -DM; Take 5 mLs by mouth 4 (four) times daily as needed.  Dispense: 118 mL; Refill: 0 -     Amoxicillin -Pot Clavulanate; Take 1 tablet by mouth 2 (two) times daily.  Dispense: 20 tablet; Refill: 0  Bronchospasm Assessment & Plan: Albuterol  inh Advair inh     Orders: -     Promethazine -DM; Take 5 mLs by mouth 4 (four) times daily as needed.  Dispense: 118 mL; Refill: 0 -     Fluticasone -Salmeterol; Inhale 2 puffs into the lungs 2 (two) times daily.  Dispense: 1 each; Refill: 12 -     Albuterol  Sulfate HFA; Inhale 2 puffs into the lungs every 6 (six) hours as needed for wheezing or shortness of breath.  Dispense: 8 g; Refill: 0 -     Amoxicillin -Pot Clavulanate; Take 1 tablet by mouth 2 (two) times daily.  Dispense: 20 tablet; Refill: 0 -     DG Chest 2 View; Future  Assessment and Plan Assessment &  Plan Acute lower respiratory infection with bronchospasm   She has an acute lower respiratory infection with bronchospasm, persisting since last Wednesday. Symptoms include persistent cough, chest tightness, wheezing, nocturnal exacerbation, and mucus production. It is treated as pneumonia due to wheezing and lung congestion. Concerns about prednisone 's impact on blood sugar led to choosing inhalers to minimize this risk. Prescribe Advair, two puffs twice daily, and albuterol  as a rescue inhaler as needed. Educate on proper inhaler technique, including inhaling while pressing the inhaler and rinsing the mouth after using Advair. Prescribe cough syrup for nighttime use to aid sleep and continue Mucinex  during the day. Encourage increased fluid intake. Order a chest x-ray if symptoms do not improve. Advise purchasing a pulse oximeter to monitor oxygen levels at home.  Acute pansinusitis   She has acute pansinusitis with head pressure and congestion. Use Flonase  for nasal congestion and continue Mucinex  for mucus management.    Charlotte Fidalgo R Lowne Chase, DO

## 2024-08-05 ENCOUNTER — Encounter: Payer: Self-pay | Admitting: Family Medicine

## 2024-08-06 ENCOUNTER — Ambulatory Visit: Payer: Self-pay

## 2024-08-06 NOTE — Telephone Encounter (Signed)
 FYI Only or Action Required?: Action required by provider: clinical question for provider and update on patient condition.  Patient was last seen in primary care on 08/04/2024 by [name], Jamee SAUNDERS, DO.  Called Nurse Triage reporting Medication Refill and Cough.  Symptoms began several days ago.  Interventions attempted: Prescription medications: zithromax , albuterol , advair.  Symptoms are: gradually improving.  Triage Disposition: Call PCP When Office is Open  Patient/caregiver understands and will follow disposition?: Yes  Reason for Disposition  [1] Caller has NON-URGENT medicine question about med that PCP prescribed AND [2] triager unable to answer question  Answer Assessment - Initial Assessment Questions 1. NAME of MEDICINE: What medicine(s) are you calling about?     Amoxicillin , Zithromax , phenergan  cough syrup 2. QUESTION: What is your question? (e.g., double dose of medicine, side effect)    Phenergan  cough syrup refill.  Also wants clarification on abx.  Prescribed Z[facility]romax  at [facility] on 10/27 and amoxicillin  by dr. Antonio on 10/28.  Patient would like to know which antibiotic to take.  Has been taking Zithromax  3. PRESCRIBER: Who prescribed the medicine? Reason: if prescribed by specialist, call should be referred to that group.     [place] 4. SYMPTOMS: Do you have any symptoms? If Yes, ask: What symptoms are you having?  How bad are the symptoms (e.g., mild, moderate, severe)     Feel like she has a lot of phlegm, but cannot cough it up Using inhalers, but does not think that they help.  The cough medicine is the only thing that helps.  Asked patient to verify dosage.  Dosage should only be 5ml (one tsp. At a time) as she was given a 6 day supply on 10/28 and is requesting an early refill  Patient reports dizziness after coughing   Patient will return to ED for any worsening of symptoms including SOB, dizziness, fever, or any worsening symptoms.   Unable to  contact [facility] for appointment options due to time of call.   Patient to call back in the morning if symptoms do not improve  Answer Assessment - Initial Assessment Questions 2. SEVERITY: How bad is the cough today?      Patient feels like she has phlegm in her throat that she cannot cough up 5. DIFFICULTY BREATHING: Are you having difficulty breathing? If Yes, ask: How bad is it? (e.g., mild, moderate, severe)      Denies difficulty breathing  7. CARDIAC HISTORY: Do you have any history of heart disease? (e.g., heart attack, congestive heart failure)      HTN  10. OTHER SYMPTOMS: Do you have any other symptoms? (e.g., runny nose, wheezing, chest pain)       Some dizziness after coughing  Patient will return to ED if symptoms worsen, including any chest pain, SOB, or increased dizziness.   Will call office in AM if symptoms do not improve overnight. Call was made after office hours, unable to contact [facility]  Protocols used: Medication Question Call-A-AH, Cough - Acute Productive-A-AH

## 2024-08-06 NOTE — Telephone Encounter (Signed)
 Copied from CRM #[id number]. Topic: Clinical - Medication Question >> Aug 06, 2024  3:24 PM China J wrote: Reason for CRM: Patient was prescribed promethazine  for a cough she was having. She was wondering if she could get another prescription sent to her pharmacy. I was unable to find he medication in the patient's medication list.:   [id number] [place], [place] - [address] AT [facility] OF [facility] & [address] [place] [address] Phone: [phone] Fax: [phone] Hours: Not open 24 hours

## 2024-08-07 NOTE — Telephone Encounter (Signed)
 Feel like she has a lot of phlegm, but cannot cough it up Using inhalers, but does not think that they help. The cough medicine is the only thing that helps. Asked patient to verify dosage. Dosage should only be 5ml (one tsp. At a time) as she was given a 6 day supply on 10/28 and is requesting an early refill  Patient reports dizziness after coughing

## 2024-08-09 ENCOUNTER — Other Ambulatory Visit: Payer: Self-pay | Admitting: Family Medicine

## 2024-08-09 DIAGNOSIS — J9801 Acute bronchospasm: Secondary | ICD-10-CM

## 2024-08-09 DIAGNOSIS — J0140 Acute pansinusitis, unspecified: Secondary | ICD-10-CM

## 2024-08-09 MED ORDER — PROMETHAZINE-DM 6.25-15 MG/5ML PO SYRP: 5.0000 mL | ORAL_SOLUTION | Freq: Four times a day (QID) | ORAL | 0 refills | Status: AC | PRN

## 2024-08-11 DIAGNOSIS — I10 Essential (primary) hypertension: Secondary | ICD-10-CM | POA: Diagnosis not present

## 2024-08-11 DIAGNOSIS — I5032 Chronic diastolic (congestive) heart failure: Secondary | ICD-10-CM | POA: Diagnosis not present

## 2024-08-11 DIAGNOSIS — E785 Hyperlipidemia, unspecified: Secondary | ICD-10-CM | POA: Diagnosis not present

## 2024-08-12 ENCOUNTER — Ambulatory Visit (HOSPITAL_BASED_OUTPATIENT_CLINIC_OR_DEPARTMENT_OTHER): Payer: Self-pay | Admitting: Family

## 2024-08-12 DIAGNOSIS — I25118 Atherosclerotic heart disease of native coronary artery with other forms of angina pectoris: Secondary | ICD-10-CM

## 2024-08-12 DIAGNOSIS — Z79899 Other long term (current) drug therapy: Secondary | ICD-10-CM

## 2024-08-12 DIAGNOSIS — E785 Hyperlipidemia, unspecified: Secondary | ICD-10-CM

## 2024-08-12 DIAGNOSIS — I10 Essential (primary) hypertension: Secondary | ICD-10-CM

## 2024-08-12 DIAGNOSIS — I5032 Chronic diastolic (congestive) heart failure: Secondary | ICD-10-CM

## 2024-08-12 LAB — COMPREHENSIVE METABOLIC PANEL WITH GFR
ALT: 20 IU/L (ref 0–32)
AST: 23 IU/L (ref 0–40)
Albumin: 4.4 g/dL (ref 3.8–4.8)
Alkaline Phosphatase: 49 IU/L (ref 49–135)
BUN/Creatinine Ratio: 17 (ref 12–28)
BUN: 21 mg/dL (ref 8–27)
Bilirubin Total: 0.5 mg/dL (ref 0.0–1.2)
CO2: 23 mmol/L (ref 20–29)
Calcium: 10.4 mg/dL — ABNORMAL HIGH (ref 8.7–10.3)
Chloride: 98 mmol/L (ref 96–106)
Creatinine, Ser: 1.26 mg/dL — ABNORMAL HIGH (ref 0.57–1.00)
Globulin, Total: 2.3 g/dL (ref 1.5–4.5)
Glucose: 367 mg/dL — ABNORMAL HIGH (ref 70–99)
Potassium: 4.7 mmol/L (ref 3.5–5.2)
Sodium: 136 mmol/L (ref 134–144)
Total Protein: 6.7 g/dL (ref 6.0–8.5)
eGFR: 44 mL/min/1.73 — ABNORMAL LOW (ref >59)

## 2024-08-12 LAB — LIPID PANEL
Chol/HDL Ratio: 4.8 ratio — ABNORMAL HIGH (ref 0.0–4.4)
Cholesterol, Total: 144 mg/dL (ref 100–199)
HDL: 30 mg/dL — ABNORMAL LOW (ref >39)
LDL Chol Calc (NIH): 75 mg/dL (ref 0–99)
Triglycerides: 234 mg/dL — ABNORMAL HIGH (ref 0–149)
VLDL Cholesterol Cal: 39 mg/dL (ref 5–40)

## 2024-08-13 NOTE — Telephone Encounter (Signed)
 Returned a call back to the pt and endorsed to her that per Reche Finder, NP,  when she comes for her lab in 2 weeks to check a BMET, we will also check her fasting lipids at that time as well.  Advised the pt to come fasting to that lab appt in 2 weeks.  Pt education provided about this.  Pt verbalized understanding and agrees with this plan.

## 2024-08-13 NOTE — Addendum Note (Signed)
 Addended by: GLADIS PORTER HERO on: 08/13/2024 10:09 AM   Modules accepted: Orders

## 2024-08-13 NOTE — Telephone Encounter (Signed)
-----   Message from Reche GORMAN Finder sent at 08/12/2024 12:13 PM EST ----- Slight increase in creatinine which is expected after initiation of Farxiga.  Normal liver enzymes. LDL (bad cholesterol) 75 which is nearly at goal <70. Triglycerides 234 which is elevated. However,  labs were collected at 2pm, suspect non-fasting. Please inquire if she was fasting for these labs and ensure taking Fenofibrate  and Atorvastatin  as prescribed.   Recommend repeat BMET in 2-3 weeks for monitoring of renal function.  ----- Message ----- From: Interface, [facility] Lab Results In Sent: 08/12/2024  12:36 AM EST To: Reche GORMAN Finder, NP

## 2024-08-13 NOTE — Telephone Encounter (Signed)
 Returned a call back to the pt.   The patient has been notified of the result and verbalized understanding.  All questions (if any) were answered.  Asked the pt if she was fasting for her lipids and she states I believe I wasn't but I can't quite remember.  Asked her if she is taking all her prescribed cardiac meds and fenofibrate /atorvastatin  as prescribed, and she confirmed she is.  Advised her to continue with her current regimen.  Pt aware to come in for repeat BMET in 2-3 weeks for monitoring of renal function.   Informed the pt that I will make Reche Finder, NP aware of all of this information.   Pt verbalized understanding and agrees with this plan.

## 2024-08-13 NOTE — Telephone Encounter (Signed)
-----   Message from Reche GORMAN Finder sent at 08/13/2024  9:53 AM EST ----- Recommend with her labs in 2-3 weeks she instead have BMET and fasting lipid panel. Please emphasize to her the need to not eat prior to lab work. TY! ----- Message ----- From: Gladis Porter HERO, LPN Sent: 88/12/7972   8:28 AM EST To: Reche GORMAN Finder, NP  Please review--she believes she was not fasting and she is taking her meds as prescribed  Thanks, Tonio Seider  ----- Message ----- From: Finder Reche GORMAN, NP Sent: 08/12/2024  12:13 PM EST To: Porter HERO Gladis, LPN  Slight increase in creatinine which is expected after initiation of Farxiga.  Normal liver enzymes. LDL (bad cholesterol) 75 which is nearly at goal <70. Triglycerides 234 which is elevated. However,  labs were collected at 2pm, suspect non-fasting. Please inquire if she was fasting for these labs and ensure taking Fenofibrate  and Atorvastatin  as prescribed.   Recommend repeat BMET in 2-3 weeks for monitoring of renal function.  ----- Message ----- From: Interface, [facility] Lab Results In Sent: 08/12/2024  12:36 AM EST To: Reche GORMAN Finder, NP

## 2024-08-14 ENCOUNTER — Other Ambulatory Visit: Payer: Self-pay | Admitting: Family Medicine

## 2024-08-14 ENCOUNTER — Ambulatory Visit: Payer: Self-pay

## 2024-08-14 DIAGNOSIS — R051 Acute cough: Secondary | ICD-10-CM

## 2024-08-14 MED ORDER — BENZONATATE 200 MG PO CAPS: 200.0000 mg | ORAL_CAPSULE | Freq: Two times a day (BID) | ORAL | 0 refills | Status: AC | PRN

## 2024-08-14 NOTE — Telephone Encounter (Signed)
**Note De-identified  [place] Obfuscation** Please advise 

## 2024-08-14 NOTE — Telephone Encounter (Signed)
 This RN attempted to contact pt. No answer, unable to leave VM, mailbox full.    Message from Moncure C sent at 08/14/2024 12:29 PM EST  Reason for Triage: Patient states she was on medication for pneumonia but took them all, and now  has a nagging cough and wants something called in for it. Pharmacy [id number] [place], [place] - [phone] AT [facility] OF [facility] & [address] [place] [address] Phone: [phone] Fax: [phone]

## 2024-08-14 NOTE — Telephone Encounter (Signed)
 Reason for Triage: Patient states she was on medication for pneumonia but took them all, and now  has a nagging cough and wants something called in for it.  Pharmacy  [id number] [place], [place] - [phone] AT [facility] OF [facility] & [address]  [address] [place] [address] [place] [address]  Phone: [phone] Fax: [phone]   2nd attempt to contact patient-no answer-unable to leave a voicemail as mailbox is full.

## 2024-08-14 NOTE — Telephone Encounter (Signed)
 FYI Only or Action Required?: Action required by provider: clinical question for provider.  Patient was last seen in primary care on 08/04/2024 by Antonio Meth, Emily SAUNDERS, DO.  Called Nurse Triage reporting Cough.  Symptoms began 3 days ago.  Interventions attempted: Rest, hydration, or home remedies.  Symptoms are: unchanged.  Triage Disposition: See HCP Within 4 Hours (Or PCP Triage)  Patient/caregiver understands and will follow disposition?: No, wishes to speak with PCP  Message from [facility] C sent at 08/14/2024 12:29 PM EST  Reason for Triage: Patient states she was on medication for pneumonia but took them all, and now  has a nagging cough and wants something called in for it. Pharmacy [id number] [place], [place] - [phone] AT [facility] OF [facility] & [address] [place] [address] Phone: [phone] Fax: [phone]   Reason for Disposition  [1] MILD difficulty breathing (e.g., minimal/no SOB at rest, SOB with walking, pulse < 100) AND [2] still present when not coughing  Answer Assessment - Initial Assessment Questions Patient states she was treated on PNA but now has developed a dry cough. Moderate in severity. Cough is nonproductive. Patient is asking if medication can be sent into her pharmacy and would like a call from the office. Patient is instructed to be seen at Urgent care if she doesn't get a call back from the office.   1. ONSET: When did the cough begin?      Started three days ago 2. SEVERITY: How bad is the cough today?      moderate 3. SPUTUM: Describe the color of your sputum (e.g., none, dry cough; clear, white, yellow, green)     dry 4. HEMOPTYSIS: Are you coughing up any blood? If Yes, ask: How much? (e.g., flecks, streaks, tablespoons, etc.)     no 5. DIFFICULTY BREATHING: Are you having difficulty breathing? If Yes, ask: How bad is it? (e.g., mild, moderate, severe)      At times  when moving too quickly-can be mild 6. FEVER: Do you have a fever? If Yes, ask: What is your temperature, how was it measured, and when did it start?     no 7. CARDIAC HISTORY: Do you have any history of heart disease? (e.g., heart attack, congestive heart failure)      yes 8. LUNG HISTORY: Do you have any history of lung disease?  (e.g., pulmonary embolus, asthma, emphysema)     no 9. PE RISK FACTORS: Do you have a history of blood clots? (or: recent major surgery, recent prolonged travel, bedridden)     no 10. OTHER SYMPTOMS: Do you have any other symptoms? (e.g., runny nose, wheezing, chest pain)       wheezing 11. TRAVEL: Have you traveled out of the country in the last month? (e.g., travel history, exposures)       no  Protocols used: Cough - Acute Non-Productive-A-AH

## 2024-08-17 NOTE — Telephone Encounter (Signed)
 Tried calling Pt to let her know that tessalon  perles has been sent to pharmacy and that she would need an appt if sx's is not improving.

## 2024-08-17 NOTE — Telephone Encounter (Signed)
 Per Dr. Antonio-  Tessalon  perles sent in----she will need office visit if this does not work

## 2024-08-20 ENCOUNTER — Encounter: Attending: Internal Medicine | Admitting: Dietician

## 2024-08-20 ENCOUNTER — Ambulatory Visit: Admitting: Obstetrics and Gynecology

## 2024-08-20 DIAGNOSIS — E1165 Type 2 diabetes mellitus with hyperglycemia: Secondary | ICD-10-CM | POA: Insufficient documentation

## 2024-08-20 NOTE — Patient Instructions (Signed)
 To add a Dexcom G7 sensor to your Omnipod 5 system, you must first activate a new Pod, then go to the Manage Sensor screen in the Omnipod 5 app, select ADD NEW, and follow the on-screen instructions to pair it by scanning the QR code on the sensor applicator or entering the pairing code and serial number manually. You must be using G7-compatible Pods and have an updated Omnipod 5 app on a compatible smartphone or Controller.

## 2024-08-24 ENCOUNTER — Other Ambulatory Visit: Payer: Self-pay | Admitting: Family Medicine

## 2024-08-26 ENCOUNTER — Other Ambulatory Visit: Payer: Self-pay | Admitting: Family Medicine

## 2024-08-26 DIAGNOSIS — J9801 Acute bronchospasm: Secondary | ICD-10-CM

## 2024-08-27 ENCOUNTER — Telehealth: Payer: Self-pay | Admitting: Dietician

## 2024-08-27 NOTE — Telephone Encounter (Signed)
 Patient called stating that she has not been able to connect her Dexcom G7 to her Omnipod 5 since Monday.  She changed her POD today. She has one placed on the front of her shoulder and one on the back of her shoulder.  Discussed that these devices need to be within line of site and this is the probable cause of her problem.  Discussed that she will need to run the pump in manual mode until the next POD change or change the POD now.  Patient states that she will wait until Sunday to change the POD. No further questions at this time.  Leita Constable, RD, LDN, CDCES, DipACLM

## 2024-09-02 ENCOUNTER — Telehealth: Payer: Self-pay

## 2024-09-02 MED ORDER — INSULIN ASPART 100 UNIT/ML IJ SOLN: INTRAMUSCULAR | 3 refills | Status: DC

## 2024-09-02 NOTE — Telephone Encounter (Signed)
 Initial Comment Caller states she is out of insulin  and hasn't had any all day and is out of refills Translation No Nurse Assessment Nurse: Gar, RN, Eddye Date/Time (Eastern Time): 09/01/2024 6:39:45 PM Confirm and document reason for call. If symptomatic, describe symptoms. ---Patient states that she is out of insulin . Last glucose reading was 277 currently. Dizziness began about an hour ago. Glucose level is normally between 130-135. Patient denies any other symptoms just experiencing fatigue and dizziness. Patient states that she is able to get out of bed okay. Does the patient have any new or worsening symptoms? ---Yes Will a triage be completed? ---Yes Related visit to physician within the last 2 weeks? ---Yes Does the PT have any chronic conditions? (i.e. diabetes, asthma, this includes High risk factors for pregnancy, etc.) ---Yes List chronic conditions. ---Diabetes Is this a behavioral health or substance abuse call? ---No Guidelines Guideline Title Affirmed Question Affirmed Notes Nurse Date/Time (Eastern Time) Diabetes - High Blood Sugar [1] Blood glucose 240 - 300 mg/dL (86.6 - 83.2 mmol/ L) AND [2] uses insulin  (e.g., insulindependent, all people with type 1 diabetes) Gar, RN, Eddye 09/01/2024 6:42:25 PM PLEASE NOTE: All timestamps contained within this report are represented as Eastern Standard Time. CONFIDENTIALTY NOTICE: This fax transmission is intended only for the addressee. It contains information that is legally privileged, confidential or otherwise protected from use or disclosure. If you are not the intended recipient, you are strictly prohibited from reviewing, disclosing, copying using or disseminating any of this information or taking any action in reliance on or regarding this information. If you have received this fax in error, please notify us  immediately by telephone so that we can arrange for its return to us . Phone: [phone],  Toll-Free: [phone], Fax: [phone] [email or web address] December 12, 1945 Page: 1 of2 CallId: [id number] Disp. Time Titus Time) Disposition Final User 09/01/2024 6:23:26 PM Attempt made - no message left Gar OBIE Eddye 09/01/2024 6:46:58 PM See PCP within 24 Hours Yes Gar, RN, Eddye Final Disposition 09/01/2024 6:46:58 PM See PCP within 24 Hours Yes Gar, RN, Eddye Disposition Overriden: Home Care Override Reason: Specify reason. (Please document in 'advice recommended' section) Caller Disagree/Comply Comply Caller Understands Yes PreDisposition Go to ED Care Advice Given Per Guideline SEE PCP WITHIN 24 HOURS: * IF OFFICE WILL BE OPEN: You need to be examined within the next 24 hours. Call your doctor (or NP/PA) when the office opens and make an appointment. * Drink at least one glass (8 oz; 240 ml) of water per hour for the next 4 hours. * Try to drink 6 to 8 glasses of water each day. CALL BACK IF: * Vomiting occurs * Rapid breathing occurs * You become worse Comments User: Eddye Gar, RN Date/Time Titus Time): 09/01/2024 6:23:16 PM Rn attempted to call but voicemail box is full. User: Gina, [place] Date/Time Titus Time): 09/01/2024 6:28:34 PM cbwn caller missed a call. User: Eddye Gar, RN Date/Time Titus Time): 09/01/2024 6:46:57 PM RN upgrade to see PCP within 24 hours due to patient running out of insulin  and having dizziness and feeling more tired than normal Referrals REFERRED TO PCP OFFICE

## 2024-09-02 NOTE — Telephone Encounter (Signed)
 Patient aware of refills and recommendations.

## 2024-09-07 NOTE — Progress Notes (Addendum)
 This was a 15 minute visit. Patient was here today for CGM training on the Dexcom G7. This was started. Provided instruction on how to link this to her Omnipod at her next POD change. No further questions.

## 2024-09-22 NOTE — Telephone Encounter (Signed)
 [name]

## 2024-09-23 ENCOUNTER — Ambulatory Visit: Payer: Self-pay

## 2024-09-23 NOTE — Telephone Encounter (Signed)
 LM for pt to return call to clinic. Attempt #1    Copied from CRM #[id number]. Topic: Clinical - Red Word Triage >> Sep 23, 2024  4:03 PM Fonda T wrote: [facility] that prompted transfer to Nurse Triage: Pt calling, states she having increased, and at times unable to wear shoes.   Also reports she had knee pain due to swelling.  Pt requesting nan appt for evaluation.   Ph. [phone] >> Sep 23, 2024  4:14 PM Fonda T wrote: Pt was holding for NT, pt hung up, attempted to contact pt back, no answer, and unable to leave voicemail.  Pt call back number is [phone]

## 2024-09-24 ENCOUNTER — Emergency Department (HOSPITAL_BASED_OUTPATIENT_CLINIC_OR_DEPARTMENT_OTHER)
Admission: EM | Admit: 2024-09-24 | Discharge: 2024-09-24 | Disposition: A | Discharge status: Home / Self Care | Attending: Emergency Medicine | Admitting: Emergency Medicine

## 2024-09-24 ENCOUNTER — Ambulatory Visit: Payer: Self-pay

## 2024-09-24 ENCOUNTER — Other Ambulatory Visit: Payer: Self-pay

## 2024-09-24 ENCOUNTER — Encounter (HOSPITAL_BASED_OUTPATIENT_CLINIC_OR_DEPARTMENT_OTHER): Payer: Self-pay

## 2024-09-24 ENCOUNTER — Emergency Department (HOSPITAL_BASED_OUTPATIENT_CLINIC_OR_DEPARTMENT_OTHER): Admitting: Radiology

## 2024-09-24 DIAGNOSIS — R079 Chest pain, unspecified: Secondary | ICD-10-CM | POA: Diagnosis not present

## 2024-09-24 DIAGNOSIS — R7989 Other specified abnormal findings of blood chemistry: Secondary | ICD-10-CM | POA: Diagnosis not present

## 2024-09-24 DIAGNOSIS — I509 Heart failure, unspecified: Secondary | ICD-10-CM | POA: Insufficient documentation

## 2024-09-24 DIAGNOSIS — I110 Hypertensive heart disease with heart failure: Secondary | ICD-10-CM | POA: Diagnosis not present

## 2024-09-24 DIAGNOSIS — Z7982 Long term (current) use of aspirin: Secondary | ICD-10-CM | POA: Diagnosis not present

## 2024-09-24 DIAGNOSIS — R600 Localized edema: Secondary | ICD-10-CM | POA: Diagnosis not present

## 2024-09-24 DIAGNOSIS — E1165 Type 2 diabetes mellitus with hyperglycemia: Secondary | ICD-10-CM | POA: Insufficient documentation

## 2024-09-24 DIAGNOSIS — R0789 Other chest pain: Secondary | ICD-10-CM | POA: Diagnosis present

## 2024-09-24 DIAGNOSIS — Z79899 Other long term (current) drug therapy: Secondary | ICD-10-CM | POA: Diagnosis not present

## 2024-09-24 DIAGNOSIS — Z794 Long term (current) use of insulin: Secondary | ICD-10-CM | POA: Insufficient documentation

## 2024-09-24 LAB — I-STAT VENOUS BLOOD GAS, ED
Acid-Base Excess: 4 mmol/L — ABNORMAL HIGH (ref 0.0–2.0)
Bicarbonate: 27 mmol/L (ref 20.0–28.0)
Calcium, Ion: 1.18 mmol/L (ref 1.15–1.40)
HCT: 36 % (ref 36.0–46.0)
Hemoglobin: 12.2 g/dL (ref 12.0–15.0)
O2 Saturation: 84 %
Potassium: 4.3 mmol/L (ref 3.5–5.1)
Sodium: 134 mmol/L — ABNORMAL LOW (ref 135–145)
TCO2: 28 mmol/L (ref 22–32)
pCO2, Ven: 35.2 mmHg — ABNORMAL LOW (ref 44–60)
pH, Ven: 7.492 — ABNORMAL HIGH (ref 7.25–7.43)
pO2, Ven: 44 mmHg (ref 32–45)

## 2024-09-24 LAB — TROPONIN T, HIGH SENSITIVITY
Troponin T High Sensitivity: 45 ng/L — ABNORMAL HIGH (ref 0–19)
Troponin T High Sensitivity: 45 ng/L — ABNORMAL HIGH (ref 0–19)
Troponin T High Sensitivity: 44 ng/L — ABNORMAL HIGH (ref 0–19)

## 2024-09-24 LAB — URINALYSIS, ROUTINE W REFLEX MICROSCOPIC
Bilirubin Urine: NEGATIVE
Glucose, UA: >1000 mg/dL — AB
Hgb urine dipstick: NEGATIVE
Ketones, ur: NEGATIVE mg/dL
Leukocytes,Ua: NEGATIVE
Nitrite: NEGATIVE
Protein, ur: 30 mg/dL — AB
Specific Gravity, Urine: 1.031 — ABNORMAL HIGH (ref 1.005–1.030)
pH: 6 (ref 5.0–8.0)

## 2024-09-24 LAB — CBC
HCT: 40.5 % (ref 36.0–46.0)
Hemoglobin: 13.4 g/dL (ref 12.0–15.0)
MCH: 30 pg (ref 26.0–34.0)
MCHC: 33.1 g/dL (ref 30.0–36.0)
MCV: 90.6 fL (ref 80.0–100.0)
Platelets: 200 K/uL (ref 150–400)
RBC: 4.47 MIL/uL (ref 3.87–5.11)
RDW: 13 % (ref 11.5–15.5)
WBC: 7.1 K/uL (ref 4.0–10.5)
nRBC: 0 % (ref 0.0–0.2)

## 2024-09-24 LAB — BASIC METABOLIC PANEL WITH GFR
Anion gap: 14 (ref 5–15)
BUN: 18 mg/dL (ref 8–23)
CO2: 25 mmol/L (ref 22–32)
Calcium: 10.9 mg/dL — ABNORMAL HIGH (ref 8.9–10.3)
Chloride: 97 mmol/L — ABNORMAL LOW (ref 98–111)
Creatinine, Ser: 0.99 mg/dL (ref 0.44–1.00)
GFR, Estimated: 58 mL/min — ABNORMAL LOW (ref >60)
Glucose, Bld: 464 mg/dL — ABNORMAL HIGH (ref 70–99)
Potassium: 4.3 mmol/L (ref 3.5–5.1)
Sodium: 135 mmol/L (ref 135–145)

## 2024-09-24 LAB — PRO BRAIN NATRIURETIC PEPTIDE: Pro Brain Natriuretic Peptide: 689 pg/mL — ABNORMAL HIGH (ref <300.0)

## 2024-09-24 LAB — CBG MONITORING, ED: Glucose-Capillary: 323 mg/dL — ABNORMAL HIGH (ref 70–99)

## 2024-09-24 MED ORDER — ALUM & MAG HYDROXIDE-SIMETH 200-200-20 MG/5ML PO SUSP
30.0000 mL | Freq: Once | ORAL | Status: AC
  Administered 2024-09-24: 19:00:00 30 mL via ORAL
  Filled 2024-09-24: qty 30

## 2024-09-24 MED ORDER — ACETAMINOPHEN 325 MG PO TABS
650.0000 mg | ORAL_TABLET | Freq: Once | ORAL | Status: AC
  Administered 2024-09-24: 19:00:00 650 mg via ORAL
  Filled 2024-09-24: qty 2

## 2024-09-24 MED ORDER — LIDOCAINE VISCOUS HCL 2 % MT SOLN
15.0000 mL | Freq: Once | OROMUCOSAL | Status: AC
  Administered 2024-09-24: 19:00:00 15 mL via ORAL
  Filled 2024-09-24: qty 15

## 2024-09-24 NOTE — Telephone Encounter (Signed)
 FYI Only or Action Required?: FYI only for provider: ED advised.  Patient was last seen in primary care on 08/04/2024 by Antonio Meth, Jamee SAUNDERS, DO.  Called Nurse Triage reporting Extremity Weakness.  Symptoms began several weeks ago.  Interventions attempted: Rest, hydration, or home remedies.  Symptoms are: gradually worsening.  Triage Disposition: Go to ED Now (or PCP Triage)  Patient/caregiver understands and will follow disposition?: Yes  Copied from CRM #[id number]. Topic: Clinical - Red Word Triage >> Sep 24, 2024 10:21 AM [name] wrote: [facility] that prompted transfer to Nurse Triage: patient is very fatigue and it feels like her knees are about to go out Reason for Disposition  Patient sounds very sick or weak to the triager  Answer Assessment - Initial Assessment Questions 1. DESCRIPTION: Describe how you are feeling.     Pt states she feels very weak, able to ambulate but feels like she has to hold onto the wall  3. ONSET: When did these symptoms begin? (e.g., hours, days, weeks, months)     3 weeks   4. CAUSE: What do you think is causing the weakness or fatigue? (e.g., not drinking enough fluids, medical problem, trouble sleeping)     Pt is unsure, states if it's not my heart then I don't know.  5. NEW MEDICINES:  Have you started on any new medicines recently? (e.g., opioid pain medicines, benzodiazepines, muscle relaxants, antidepressants, antihistamines, neuroleptics, beta blockers)     Pt is on lasix   6. OTHER SYMPTOMS: Do you have any other symptoms? (e.g., chest pain, fever, cough, SOB, vomiting, diarrhea, bleeding, other areas of pain)     Swelling in feet   Hx: Essential hypertension  Chronic diastolic heart failure  Coronary artery disease of native artery of native heart with stable angina pectoris  Hyperlipidemia  Protocols used: Weakness (Generalized) and Fatigue-A-AH

## 2024-09-24 NOTE — ED Notes (Signed)
 Patient transported to X-Ray

## 2024-09-24 NOTE — Discharge Instructions (Signed)
 Today you were seen for chest pain and swelling.  Please take 2 of your Lasix  for the next 5 days instead of 1 to help reduce your swelling.  Please follow-up with your cardiologist if your symptoms persist for further evaluation and workup.  Thank you for letting us  treat you today. After reviewing your labs and imaging, I feel you are safe to go home. Please follow up with your PCP in the next several days and provide them with your records from this visit. Return to the Emergency Room if pain becomes severe or symptoms worsen.

## 2024-09-24 NOTE — ED Provider Notes (Signed)
 " [place] [facility] AT [facility] Provider Note   CSN: [id number] Arrival date & time: 09/24/24  1536     Patient presents with: No chief complaint on file.   [name] is a 78 y.o. female.  Past medical history significant for hypertension, diabetes, CHF presents today for left-sided chest pain and centralized chest burning, shortness of breath that radiates under her arm and knee pain.  Patient reports that she has had the symptoms for approximately 3 weeks.  Patient denies fever, chills, nausea, vomiting, any other complaints at this time.   HPI     Prior to Admission medications  Medication Sig Start Date End Date Taking? Authorizing Provider  FLUoxetine  (PROZAC ) 20 MG capsule Take 60 mg by mouth every morning. 09/18/24  Yes [provider]  albuterol  (VENTOLIN  HFA) 108 (90 Base) MCG/ACT inhaler INHALE 2 PUFFS INTO THE LUNGS EVERY 6 HOURS AS NEEDED FOR WHEEZING 08/26/24   Lowne Chase, Yvonne R, DO  Alcohol Swabs (B-D SINGLE USE SWABS REGULAR) PADS USE AS DIRECTED 03/15/20   Antonio Meth, Jamee SAUNDERS, DO  alendronate  (FOSAMAX ) 70 MG tablet TAKE ONE TABLET BY MOUTH ONCE WEEKLY ON WEDNESDAY 05/05/24   Lowne Chase, Yvonne R, DO  ALPRAZolam  (XANAX ) 0.5 MG tablet Take 1 tablet (0.5 mg total) by mouth 2 (two) times daily as needed for anxiety. 06/23/24   Antonio Meth Jamee SAUNDERS, DO  amLODipine  (NORVASC ) 10 MG tablet Take 1 tablet (10 mg total) by mouth daily. 05/05/24   Antonio Meth Jamee SAUNDERS, DO  amoxicillin -clavulanate (AUGMENTIN ) 875-125 MG tablet Take 1 tablet by mouth 2 (two) times daily. 08/04/24   Antonio Meth Jamee SAUNDERS, DO  aspirin  EC 81 MG tablet Take 81 mg by mouth daily.    [provider]  atorvastatin  (LIPITOR) 80 MG tablet Take 1 tablet (80 mg total) by mouth every evening. 03/04/24   Antonio Meth, Jamee SAUNDERS, DO  azithromycin  (ZITHROMAX  Z-PAK) 250 MG tablet Take 1 tablet (250 mg total) by mouth daily. Take as directed 08/03/24   Horton,  Kristie M, DO  benzonatate  (TESSALON ) 200 MG capsule Take 1 capsule (200 mg total) by mouth 2 (two) times daily as needed for cough. 08/14/24   Antonio Meth Jamee SAUNDERS, DO  buPROPion  (WELLBUTRIN  XL) 300 MG 24 hr tablet Take 1 tablet (300 mg total) by mouth daily. 07/12/23   Lowne Chase, Yvonne R, DO  Carboxymethylcellulose Sodium (THERATEARS OP) Place 1 drop into both eyes daily as needed (dry eyes).    [provider]  Ciclopirox  1 % shampoo Massage into scalp 2x a week for 4 weeks 03/09/24   Antonio Meth, Jamee SAUNDERS, DO  Continuous Glucose Receiver (DEXCOM G6 RECEIVER) DEVI Use to monitor glucose 01/09/24   Shamleffer, Donell Cardinal, MD  Continuous Glucose Sensor (DEXCOM G7 SENSOR) MISC 1 Device by Does not apply route as directed. Every 10 days 07/24/24   Shamleffer, Donell Cardinal, MD  Continuous Glucose Transmitter (DEXCOM G6 TRANSMITTER) MISC REPLACE TRANSMITTER EVERY 90 DAYS 01/09/24   Shamleffer, Ibtehal Jaralla, MD  dapagliflozin  propanediol (FARXIGA ) 10 MG TABS tablet Take 1 tablet (10 mg total) by mouth daily before breakfast. 08/03/24   Walker, Caitlin S, NP  dapagliflozin  propanediol (FARXIGA ) 10 MG TABS tablet Take 1 tablet (10 mg total) by mouth daily before breakfast. 08/03/24   Walker, Caitlin S, NP  esomeprazole  (NEXIUM ) 40 MG capsule Take 1 capsule (40 mg total) by mouth daily. 03/11/24   Lowne Chase, Yvonne R, DO  fenofibrate   160 MG tablet Take 1 tablet (160 mg total) by mouth every evening. 05/05/24   Antonio Meth, Yvonne R, DO  ferrous sulfate  325 (65 FE) MG tablet Take 325 mg by mouth daily with breakfast.     [provider]  fexofenadine (ALLEGRA) 180 MG tablet Take 180 mg by mouth daily as needed for allergies or rhinitis.    [provider]  fluticasone  (FLONASE ) 50 MCG/ACT nasal spray INSTILL 2 SPRAYS IN EACH NOSTRIL DAILY 01/14/23   Antonio Meth, Jamee SAUNDERS, DO  fluticasone -salmeterol (ADVAIR HFA) 115-21 MCG/ACT inhaler Inhale 2 puffs into the lungs 2 (two) times  daily. 08/04/24   Antonio Meth Jamee R, DO  furosemide  (LASIX ) 20 MG tablet TAKE 1 TABLET BY MOUTH DAILY 07/21/24   Walker, Caitlin S, NP  glucose blood (CONTOUR NEXT TEST) test strip Use to check blood sugar up to three times daily 04/29/24   Shamleffer, Ibtehal Jaralla, MD  glucose blood (TRUE METRIX BLOOD GLUCOSE TEST) test strip USE AS DIRECTED 4 TIMES A DAY 10/27/19   Antonio Meth Jamee SAUNDERS, DO  insulin  aspart (NOVOLOG ) 100 UNIT/ML injection Max daily 60 units 09/02/24   Shamleffer, Donell Cardinal, MD  Insulin  Disposable Pump (OMNIPOD 5 G7 INTRO, GEN 5,) KIT 1 Device by Does not apply route every other day. 03/31/24   Shamleffer, Donell Cardinal, MD  Insulin  Disposable Pump (OMNIPOD 5 G7 PODS, GEN 5,) MISC 1 Device by Does not apply route every 3 (three) days. 03/31/24   Shamleffer, Ibtehal Jaralla, MD  Insulin  Pen Needle (COMFORT EZ PEN NEEDLES) 32G X 4 MM MISC 1 Device by Other route in the morning, at noon, in the evening, and at bedtime. as directed 05/21/23   Shamleffer, Ibtehal Jaralla, MD  Iron , Ferrous Sulfate , 325 (65 Fe) MG TABS Take 325 mg by mouth daily. 06/26/24   Antonio Meth Jamee R, DO  metoprolol  succinate (TOPROL -XL) 50 MG 24 hr tablet TAKE ONE TABLET BY MOUTH ONCE DAILY WITH FOOD 05/05/24   Lowne Chase, Yvonne R, DO  Multiple Vitamin (MULTIVITAMIN WITH MINERALS) TABS Take 1 tablet by mouth daily.    [provider]  Omega-3 Fatty Acids (FISH OIL) 1000 MG CAPS Take 1 capsule by mouth in the morning and at bedtime.    [provider]  omeprazole  (PRILOSEC) 20 MG capsule Take 1 capsule (20 mg total) by mouth daily. 06/23/24   Antonio Meth Jamee SAUNDERS, DO  promethazine -dextromethorphan (PROMETHAZINE -DM) 6.25-15 MG/5ML syrup Take 5 mLs by mouth 4 (four) times daily as needed. 08/09/24   Antonio Meth Jamee SAUNDERS, DO  valsartan  (DIOVAN ) 320 MG tablet Take 1 tablet (320 mg total) by mouth every morning. 05/05/24   Antonio Meth, Yvonne R, DO  Vitamin D , Ergocalciferol , (DRISDOL ) 1.25  MG (50000 UNIT) CAPS capsule TAKE 1 CAPSULE BY MOUTH EVERY 7 DAYS 08/24/24   Lowne Chase, Yvonne R, DO  vitamin E  400 UNIT capsule Take 400 Units by mouth daily.    [provider]    Allergies: Metformin and related, Levemir [insulin  detemir], Clindamycin, and Crestor  [rosuvastatin  calcium ]    Review of Systems  Respiratory:  Positive for shortness of breath.   Cardiovascular:  Positive for chest pain and leg swelling.  Musculoskeletal:  Positive for arthralgias.    Updated Vital Signs BP (!) 157/66   Pulse 65   Temp 98.4 F (36.9 C) (Oral)   Resp 19   SpO2 100%   Physical Exam Vitals and nursing note reviewed.  Constitutional:  General: She is not in acute distress.    Appearance: She is well-developed. She is not toxic-appearing.  HENT:     Head: Normocephalic and atraumatic.     Right Ear: External ear normal.     Left Ear: External ear normal.     Mouth/Throat:     Mouth: Mucous membranes are moist.     Pharynx: Oropharynx is clear.  Eyes:     Conjunctiva/sclera: Conjunctivae normal.  Cardiovascular:     Rate and Rhythm: Normal rate and regular rhythm.     Pulses: Normal pulses.     Heart sounds: Normal heart sounds. No murmur heard. Pulmonary:     Effort: Pulmonary effort is normal. No respiratory distress.     Breath sounds: No wheezing or rhonchi.  Abdominal:     Palpations: Abdomen is soft.     Tenderness: There is no abdominal tenderness.  Musculoskeletal:        General: No swelling.     Cervical back: Neck supple.     Comments: +2 pitting edema to bilateral lower extremities up to the ankles.  Patient neurovascularly intact with +2 dorsalis pedis pulses bilaterally.  Skin:    General: Skin is warm and dry.     Capillary Refill: Capillary refill takes less than 2 seconds.  Neurological:     General: No focal deficit present.     Mental Status: She is alert and oriented to person, place, and time.  Psychiatric:        Mood and Affect: Mood  normal.     (all labs ordered are listed, but only abnormal results are displayed) Labs Reviewed  BASIC METABOLIC PANEL WITH GFR - Abnormal; Notable for the following components:      Result Value   Chloride 97 (*)    Glucose, Bld 464 (*)    Calcium  10.9 (*)    GFR, Estimated 58 (*)    All other components within normal limits  PRO BRAIN NATRIURETIC PEPTIDE - Abnormal; Notable for the following components:   Pro Brain Natriuretic Peptide 689.0 (*)    All other components within normal limits  URINALYSIS, ROUTINE W REFLEX MICROSCOPIC - Abnormal; Notable for the following components:   Specific Gravity, Urine 1.031 (*)    Glucose, UA >1,000 (*)    Protein, ur 30 (*)    Bacteria, UA RARE (*)    All other components within normal limits  I-STAT VENOUS BLOOD GAS, ED - Abnormal; Notable for the following components:   pH, Ven 7.492 (*)    pCO2, Ven 35.2 (*)    Acid-Base Excess 4.0 (*)    Sodium 134 (*)    All other components within normal limits  CBG MONITORING, ED - Abnormal; Notable for the following components:   Glucose-Capillary 323 (*)    All other components within normal limits  TROPONIN T, HIGH SENSITIVITY - Abnormal; Notable for the following components:   Troponin T High Sensitivity 45 (*)    All other components within normal limits  TROPONIN T, HIGH SENSITIVITY - Abnormal; Notable for the following components:   Troponin T High Sensitivity 45 (*)    All other components within normal limits  TROPONIN T, HIGH SENSITIVITY - Abnormal; Notable for the following components:   Troponin T High Sensitivity 44 (*)    All other components within normal limits  CBC    EKG: EKG Interpretation Date/Time:  Thursday September 24 2024 15:44:24 EST Ventricular Rate:  78 PR Interval:  164 QRS  Duration:  134 QT Interval:  440 QTC Calculation: 501 R Axis:   -87  Text Interpretation: Normal sinus rhythm Right bundle branch block Left anterior fascicular block Bifascicular block  Cannot rule out Inferior infarct , age undetermined Abnormal ECG When compared with ECG of 16-Jul-2024 11:24, PREVIOUS ECG IS PRESENT Confirmed by Pamella Sharper [phone]) on 09/24/2024 9:36:11 PM  Radiology: [facility] Chest 2 View Result Date: 09/24/2024 CLINICAL DATA:  Chest pain. EXAM: CHEST - 2 VIEW COMPARISON:  Chest radiograph dated 07/16/2024. FINDINGS: No focal consolidation, pleural effusion or pneumothorax. Stable cardiomegaly. Coronary vascular stent. No acute osseous pathology. IMPRESSION: 1. No active cardiopulmonary disease. 2. Cardiomegaly. Electronically Signed   By: Vanetta Chou M.D.   On: 09/24/2024 17:32     Procedures   Medications Ordered in the ED  alum & mag hydroxide-simeth (MAALOX/MYLANTA) 200-200-20 MG/5ML suspension 30 mL (30 mLs Oral Given 09/24/24 1918)    And  lidocaine  (XYLOCAINE ) 2 % viscous mouth solution 15 mL (15 mLs Oral Given 09/24/24 1918)  acetaminophen  (TYLENOL ) tablet 650 mg (650 mg Oral Given 09/24/24 1922)                                    Medical Decision Making Amount and/or Complexity of Data Reviewed Labs: ordered. Radiology: ordered.  Risk OTC drugs. Prescription drug management.   This patient presents to the ED for concern of SHOB, CP, swelling, this involves an extensive number of treatment options, and is a complaint that carries with it a high risk of complications and morbidity.  The differential diagnosis includes CHF exacerbation, STEMI, NSTEMI, arrhythmia, anemia, electrolyte abnormality, GERD, anxiety   Co morbidities / Chronic conditions that complicate the patient evaluation  CHF, GERD, diabetes, hypertension   Additional history obtained:  Additional history obtained from EMR External records from outside source obtained and reviewed including cardiology note   Lab Tests:  I Ordered, and personally interpreted labs.  The pertinent results include: CBC unremarkable, hyperglycemia 464, elevated proBNP at 689,  elevated troponin of 45, 44, patient alkalotic 7.49, UA with elevated specific gravity, greater than thousand and glucose, 30 protein, rare bacteria, 11-20 WBCs, repeat CBG 323   Imaging Studies ordered:  I ordered imaging studies including CXR  I independently visualized and interpreted imaging which showed no active cardiopulmonary disease.  Cardiomegaly. I agree with the radiologist interpretation   Cardiac Monitoring: / EKG:  The patient was maintained on a cardiac monitor.  I personally viewed and interpreted the cardiac monitored which showed an underlying rhythm of: NSR, RBBB   Problem List / ED Course / Critical interventions / Medication management I ordered medication including Tylenol  and GI cocktail I have reviewed the patients home medicines and have made adjustments as needed  Test / Admission - Considered:  Considered for admission or further workup however patient's vital signs, physical exam, labs, and imaging are reassuring.  Patient's symptoms likely due to peripheral edema.  Patient advised to double Lasix  dosage for the next 5 days and follow-up with cardiology.  Patient given return precautions.  I feel patient is safe for discharge at this time.     Final diagnoses:  Peripheral edema  Chest pain, unspecified type    ED Discharge Orders     None          Francis Ileana SAILOR, PA-C 09/24/24 2153    Pamella Sharper LABOR, DO 10/01/24 1258  "

## 2024-09-24 NOTE — ED Notes (Signed)
 ED Provider at bedside.

## 2024-09-24 NOTE — Telephone Encounter (Signed)
FYI. Pt going to ED.  

## 2024-09-24 NOTE — ED Triage Notes (Signed)
 Pt c/o L sided CP, centralized CP burning, SHOB that radiates under arm, also c/o L knee pain. Advises pain has been going on for awhile (at least 3wks), seems to be worse this morning. Pt advises recently being put on lasix , I don't think it's working, I've still got an awful lot of swelling.

## 2024-09-24 NOTE — ED Notes (Signed)
 Reviewed discharge instructions and follow up care with pt and family. Pt verbalized understanding and had no further questions. Pt exited ED without complications.

## 2024-09-24 NOTE — Telephone Encounter (Signed)
 LMOM asking for call back.

## 2024-09-25 ENCOUNTER — Ambulatory Visit: Payer: Self-pay

## 2024-09-25 NOTE — Telephone Encounter (Signed)
 2nd callback attempt: LVM to call office back. Forwarding to office.

## 2024-09-25 NOTE — Telephone Encounter (Signed)
 Attempt # 1 to reach patient to triage symptoms. Left VM to call back    Copied from CRM #[id number]. Topic: Clinical - Red Word Triage >> Sep 25, 2024  4:39 PM Joesph NOVAK wrote: Red Word that prompted transfer to Nurse Triage: worsening fatigue, knees are weak.. she can't walk- headaches. Swelling, bloating.

## 2024-09-29 ENCOUNTER — Ambulatory Visit (INDEPENDENT_AMBULATORY_CARE_PROVIDER_SITE_OTHER): Admitting: Family

## 2024-09-29 ENCOUNTER — Encounter: Payer: Self-pay | Admitting: Family

## 2024-09-29 VITALS — BP 158/70 | HR 63 | Temp 98.0°F | Resp 16 | Ht 65.0 in | Wt 192.2 lb

## 2024-09-29 DIAGNOSIS — Z09 Encounter for follow-up examination after completed treatment for conditions other than malignant neoplasm: Secondary | ICD-10-CM

## 2024-09-29 DIAGNOSIS — E1165 Type 2 diabetes mellitus with hyperglycemia: Secondary | ICD-10-CM

## 2024-09-29 DIAGNOSIS — K219 Gastro-esophageal reflux disease without esophagitis: Secondary | ICD-10-CM | POA: Diagnosis not present

## 2024-09-29 DIAGNOSIS — R600 Localized edema: Secondary | ICD-10-CM

## 2024-09-29 LAB — TSH: TSH: 2.85 u[IU]/mL (ref 0.35–5.50)

## 2024-09-29 LAB — CBC WITH DIFFERENTIAL/PLATELET
Basophils Absolute: 0 K/uL (ref 0.0–0.1)
Basophils Relative: 0.7 % (ref 0.0–3.0)
Eosinophils Absolute: 0.1 K/uL (ref 0.0–0.7)
Eosinophils Relative: 1.5 % (ref 0.0–5.0)
HCT: 37.8 % (ref 36.0–46.0)
Hemoglobin: 12.7 g/dL (ref 12.0–15.0)
Lymphocytes Relative: 50.3 % — ABNORMAL HIGH (ref 12.0–46.0)
Lymphs Abs: 3.3 K/uL (ref 0.7–4.0)
MCHC: 33.6 g/dL (ref 30.0–36.0)
MCV: 88.5 fl (ref 78.0–100.0)
Monocytes Absolute: 0.6 K/uL (ref 0.1–1.0)
Monocytes Relative: 9.4 % (ref 3.0–12.0)
Neutro Abs: 2.5 K/uL (ref 1.4–7.7)
Neutrophils Relative %: 38.1 % — ABNORMAL LOW (ref 43.0–77.0)
Platelets: 178 K/uL (ref 150.0–400.0)
RBC: 4.27 Mil/uL (ref 3.87–5.11)
RDW: 13.9 % (ref 11.5–15.5)
WBC: 6.6 K/uL (ref 4.0–10.5)

## 2024-09-29 LAB — BASIC METABOLIC PANEL WITH GFR
BUN: 19 mg/dL (ref 6–23)
CO2: 30 meq/L (ref 19–32)
Calcium: 10.5 mg/dL (ref 8.4–10.5)
Chloride: 102 meq/L (ref 96–112)
Creatinine, Ser: 0.81 mg/dL (ref 0.40–1.20)
GFR: 69.38 mL/min
Glucose, Bld: 154 mg/dL — ABNORMAL HIGH (ref 70–99)
Potassium: 3.5 meq/L (ref 3.5–5.1)
Sodium: 139 meq/L (ref 135–145)

## 2024-09-29 LAB — BRAIN NATRIURETIC PEPTIDE: Pro B Natriuretic peptide (BNP): 127 pg/mL — ABNORMAL HIGH (ref 1.0–100.0)

## 2024-09-29 LAB — HEPATIC FUNCTION PANEL
ALT: 22 U/L (ref 3–35)
AST: 23 U/L (ref 5–37)
Albumin: 4.2 g/dL (ref 3.5–5.2)
Alkaline Phosphatase: 45 U/L (ref 39–117)
Bilirubin, Direct: 0.1 mg/dL (ref 0.1–0.3)
Total Bilirubin: 0.6 mg/dL (ref 0.2–1.2)
Total Protein: 6.5 g/dL (ref 6.0–8.3)

## 2024-09-29 MED ORDER — OMEPRAZOLE 40 MG PO CPDR: 40.0000 mg | DELAYED_RELEASE_CAPSULE | Freq: Every day | ORAL | 3 refills | Status: AC

## 2024-09-29 NOTE — Progress Notes (Signed)
 "  Acute Office Visit  Subjective:     Patient ID: [name], female    DOB: 1946/03/28, 78 y.o.   MRN: [id number]  Chief Complaint  Patient presents with   Knee Pain    Patient is here for bilateral knee pain, back pain, stomach bloating & indigestion. Patient also stated that she has been feeling nervous and agitated.     HPI Patient is in today with multiple concerns today.  However, the primary purpose of the visit is a hospital follow-up from 09/24/2024 where she presented with chest pain and shortness of breath.  She was found to be in fluid overload with an elevated BNP of 689.  She is currently on Lasix  once daily but over the last 5 days she is taking 2 tablets daily as she is having more swelling in her extremities.  She reports that increasing the Lasix  has been beneficial.  Patient also reports having a fall yesterday on some steps where she slipped and fell backwards.  Initially she had no pain.  Last night, she did begin to develop some low back pain that she rates a 7 out of 10.  She took Tylenol  that has helped.  Denies any pain at all today.  Patient also has concerns of bloating, abdominal pain, heartburn and indigestion.  She takes omeprazole  20 mg once daily and tolerates it well.  However, it does not always get rid of the symptoms.  Last night she used Alka-Seltzer and being on that did help.  Review of Systems  Cardiovascular:  Positive for leg swelling. Negative for chest pain and palpitations.  Gastrointestinal:  Positive for heartburn.  Musculoskeletal:  Positive for back pain.  Skin: Negative.   Neurological: Negative.   Psychiatric/Behavioral: Negative.    All other systems reviewed and are negative.  Past Medical History:  Diagnosis Date   Blockage of coronary artery of heart (HCC)    Depression    Diabetes mellitus    Hyperlipidemia    Hypertension    Pancreatitis    Restless leg syndrome     Social History   Socioeconomic History    Marital status: Married    Spouse name: Not on file   Number of children: Not on file   Years of education: Not on file   Highest education level: Not on file  Occupational History   Not on file  Tobacco Use   Smoking status: Former    Current packs/day: 0.00    Average packs/day: 0.2 packs/day    Types: Cigarettes    Quit date: 05/05/2020    Years since quitting: 4.4    Passive exposure: Current (and in the past as well)   Smokeless tobacco: Never   Tobacco comments:    quit in 2021  Vaping Use   Vaping status: Never Used  Substance and Sexual Activity   Alcohol use: No   Drug use: No   Sexual activity: Not Currently  Other Topics Concern   Not on file  Social History Narrative   Exercise: seldom   Caffeine use: seldom   Social Drivers of Health   Tobacco Use: Medium Risk (09/29/2024)   Patient History    Smoking Tobacco Use: Former    Smokeless Tobacco Use: Never    Passive Exposure: Current  Physicist, Medical Strain: Low Risk (06/09/2024)   Overall Financial Resource Strain (CARDIA)    Difficulty of Paying Living Expenses: Not very hard  Food Insecurity: No Food Insecurity (06/09/2024)  Epic    Worried About Programme Researcher, Broadcasting/film/video in the Last Year: Never true    [employer] of Food in the Last Year: Never true  Transportation Needs: No Transportation Needs (06/09/2024)   Epic    Lack of Transportation (Medical): No    Lack of Transportation (Non-Medical): No  Physical Activity: Inactive (06/09/2024)   Exercise Vital Sign    Days of Exercise per Week: 0 days    Minutes of Exercise per Session: 0 min  Stress: No Stress Concern Present (06/09/2024)   [employer] of Occupational Health - Occupational Stress Questionnaire    Feeling of Stress: Not at all  Social Connections: Moderately Integrated (06/09/2024)   Social Connection and Isolation Panel    Frequency of Communication with Friends and Family: More than three times a week    Frequency  of Social Gatherings with Friends and Family: More than three times a week    Attends Religious Services: 1 to 4 times per year    Active Member of [employer] or Organizations: No    Attends Banker Meetings: Never    Marital Status: Married  Catering Manager Violence: Not At Risk (06/09/2024)   Epic    Fear of Current or Ex-Partner: No    Emotionally Abused: No    Physically Abused: No    Sexually Abused: No  Depression (PHQ2-9): Medium Risk (08/04/2024)   Depression (PHQ2-9)    PHQ-2 Score: 10  Alcohol Screen: Low Risk (06/09/2024)   Alcohol Screen    Last Alcohol Screening Score (AUDIT): 0  Housing: Unknown (06/09/2024)   Epic    Unable to Pay for Housing in the Last Year: No    Number of Times Moved in the Last Year: Not on file    Homeless in the Last Year: No  Utilities: Not At Risk (06/09/2024)   Epic    Threatened with loss of utilities: No  Health Literacy: Adequate Health Literacy (06/09/2024)   B1300 Health Literacy    Frequency of need for help with medical instructions: Never    Past Surgical History:  Procedure Laterality Date   CATARACT EXTRACTION, BILATERAL     CORONARY ANGIOPLASTY WITH STENT PLACEMENT     FOOT SURGERY Right    cyst removal   ROTATOR CUFF REPAIR     right   ROTATOR CUFF REPAIR Left    TUBAL LIGATION      Family History  Problem Relation Age of Onset   Heart attack Father    Heart attack Brother    Diabetes Sister    Cirrhosis Sister    Hypertension Sister    Cancer Mother    Hypertension Mother    Vision loss Mother    Cancer Sister    Sleep apnea Sister    Heart Problems Sister    Diabetes Sister    Gout Sister    Diabetes Son    Heart attack Son    Diabetes Daughter    Goiter Daughter    Diabetes Daughter     Allergies[1]  Medications Ordered Prior to Encounter[2]  BP (!) 158/70 (BP Location: Right Arm, Patient Position: Sitting, Cuff Size: Large)   Pulse 63   Temp 98 F (36.7  C) (Oral)   Resp 16   Ht 5' 5 (1.651 m)   Wt 192 lb 3.2 oz (87.2 kg)   SpO2 98%   BMI 31.98 kg/m chart      Objective:    BP (!) 158/70 (BP  Location: Right Arm, Patient Position: Sitting, Cuff Size: Large)   Pulse 63   Temp 98 F (36.7 C) (Oral)   Resp 16   Ht 5' 5 (1.651 m)   Wt 192 lb 3.2 oz (87.2 kg)   SpO2 98%   BMI 31.98 kg/m    Physical Exam Vitals and nursing note reviewed.  Constitutional:      Appearance: Normal appearance. She is obese.  Cardiovascular:     Rate and Rhythm: Normal rate and regular rhythm.     Pulses: Normal pulses.     Heart sounds: Normal heart sounds.  Pulmonary:     Effort: Pulmonary effort is normal.     Breath sounds: Normal breath sounds.  Abdominal:     General: Bowel sounds are normal.     Palpations: Abdomen is soft.  Musculoskeletal:        General: Normal range of motion.     Cervical back: Normal range of motion and neck supple.     Right lower leg: Edema present.     Left lower leg: Edema present.  Skin:    General: Skin is warm and dry.  Neurological:     General: No focal deficit present.     Mental Status: She is alert and oriented to person, place, and time. Mental status is at baseline.  Psychiatric:        Mood and Affect: Mood normal.        Behavior: Behavior normal.        Thought Content: Thought content normal.        Judgment: Judgment normal.    No results found for any visits on 09/29/24.      Assessment & Plan:   Problem List Items Addressed This Visit     Type 2 diabetes mellitus with hyperglycemia, without long-term current use of insulin  (HCC)   Relevant Orders   B Nat Peptide   Basic Metabolic Panel (BMET)   Hepatic function panel   TSH   CBC w/Diff   GERD   Relevant Medications   omeprazole  (PRILOSEC) 40 MG capsule   Other Relevant Orders   B Nat Peptide   Basic Metabolic Panel (BMET)   Hepatic function panel   TSH   CBC w/Diff   Other Visit Diagnoses       Hospital  discharge follow-up    -  Primary   Relevant Orders   B Nat Peptide   Basic Metabolic Panel (BMET)   Hepatic function panel   TSH   CBC w/Diff     Peripheral edema       Relevant Orders   B Nat Peptide   Basic Metabolic Panel (BMET)   Hepatic function panel   TSH   CBC w/Diff       Meds ordered this encounter  Medications   omeprazole  (PRILOSEC) 40 MG capsule    Sig: Take 1 capsule (40 mg total) by mouth daily.    Dispense:  30 capsule    Refill:  3    Supervising Provider:   DOMENICA BLACKBIRD A [id number]   Patient had other concerns as well today to include anxiety and bilateral knee pain.  We we will schedule a 2-week recheck to address those concerns.  For now labs were ordered to include a BMP, BNP, liver function test, TSH and CBC.  If her kidneys are able to tolerate it we will increase her Lasix  to twice a day from once a day since has been  beneficial.  Omeprazole  today was increased from 20 mg to 40 mg to help with the reflux.  We will continue to monitor her symptoms at the fall but today her exam and pain level is unremarkable.  Call the office with any questions or concerns.  Recheck in 2 weeks and sooner as needed. Return in about 2 weeks (around 10/13/2024) for Recheck and discuss anxiety.  Thamas Appleyard B Cranston Koors, FNP       [1] Allergies Allergen Reactions   Metformin And Related Other (See Comments)    Sick on stomach, sweaty, throw up, pain   Levemir [Insulin  Detemir] Hives and Swelling   Clindamycin Nausea And Vomiting   Crestor  [Rosuvastatin  Calcium ] Palpitations  [2] Current Outpatient Medications on File Prior to Visit  Medication Sig Dispense Refill   albuterol  (VENTOLIN  HFA) 108 (90 Base) MCG/ACT inhaler INHALE 2 PUFFS INTO THE LUNGS EVERY 6 HOURS AS NEEDED FOR WHEEZING 8.5 g 2   Alcohol Swabs (B-D SINGLE USE SWABS REGULAR) PADS USE AS DIRECTED 400 each 1   alendronate  (FOSAMAX ) 70 MG tablet TAKE ONE TABLET BY MOUTH ONCE WEEKLY ON WEDNESDAY 12 tablet 3    ALPRAZolam  (XANAX ) 0.5 MG tablet Take 1 tablet (0.5 mg total) by mouth 2 (two) times daily as needed for anxiety. 60 tablet 1   amLODipine  (NORVASC ) 10 MG tablet Take 1 tablet (10 mg total) by mouth daily. 90 tablet 3   amoxicillin -clavulanate (AUGMENTIN ) 875-125 MG tablet Take 1 tablet by mouth 2 (two) times daily. 20 tablet 0   aspirin  EC 81 MG tablet Take 81 mg by mouth daily.     atorvastatin  (LIPITOR) 80 MG tablet Take 1 tablet (80 mg total) by mouth every evening. 90 tablet 1   azithromycin  (ZITHROMAX  Z-PAK) 250 MG tablet Take 1 tablet (250 mg total) by mouth daily. Take as directed 6 tablet 0   benzonatate  (TESSALON ) 200 MG capsule Take 1 capsule (200 mg total) by mouth 2 (two) times daily as needed for cough. 20 capsule 0   buPROPion  (WELLBUTRIN  XL) 300 MG 24 hr tablet Take 1 tablet (300 mg total) by mouth daily. 90 tablet 3   Carboxymethylcellulose Sodium (THERATEARS OP) Place 1 drop into both eyes daily as needed (dry eyes).     Ciclopirox  1 % shampoo Massage into scalp 2x a week for 4 weeks 120 mL 0   Continuous Glucose Receiver (DEXCOM G6 RECEIVER) DEVI Use to monitor glucose 1 each 0   Continuous Glucose Sensor (DEXCOM G7 SENSOR) MISC 1 Device by Does not apply route as directed. Every 10 days 9 each 3   dapagliflozin  propanediol (FARXIGA ) 10 MG TABS tablet Take 1 tablet (10 mg total) by mouth daily before breakfast. 30 tablet 5   dapagliflozin  propanediol (FARXIGA ) 10 MG TABS tablet Take 1 tablet (10 mg total) by mouth daily before breakfast. 14 tablet 0   fenofibrate  160 MG tablet Take 1 tablet (160 mg total) by mouth every evening. 90 tablet 3   ferrous sulfate  325 (65 FE) MG tablet Take 325 mg by mouth daily with breakfast.      fexofenadine (ALLEGRA) 180 MG tablet Take 180 mg by mouth daily as needed for allergies or rhinitis.     FLUoxetine  (PROZAC ) 20 MG capsule Take 60 mg by mouth every morning.     fluticasone  (FLONASE ) 50 MCG/ACT nasal spray INSTILL 2 SPRAYS  IN EACH NOSTRIL DAILY 48 g 1   fluticasone -salmeterol (ADVAIR HFA) 115-21 MCG/ACT inhaler Inhale 2 puffs into the lungs 2 (  two) times daily. 1 each 12   furosemide  (LASIX ) 20 MG tablet TAKE 1 TABLET BY MOUTH DAILY 90 tablet 3   glucose blood (CONTOUR NEXT TEST) test strip Use to check blood sugar up to three times daily 100 each 12   glucose blood (TRUE METRIX BLOOD GLUCOSE TEST) test strip USE AS DIRECTED 4 TIMES A DAY 400 strip 1   insulin  aspart (NOVOLOG ) 100 UNIT/ML injection Max daily 60 units 60 mL 3   Insulin  Disposable Pump (OMNIPOD 5 G7 INTRO, GEN 5,) KIT 1 Device by Does not apply route every other day. 1 kit 0   Insulin  Disposable Pump (OMNIPOD 5 G7 PODS, GEN 5,) MISC 1 Device by Does not apply route every 3 (three) days. 45 each 3   Insulin  Pen Needle (COMFORT EZ PEN NEEDLES) 32G X 4 MM MISC 1 Device by Other route in the morning, at noon, in the evening, and at bedtime. as directed 400 each 3   Iron , Ferrous Sulfate , 325 (65 Fe) MG TABS Take 325 mg by mouth daily. 30 tablet 2   metoprolol  succinate (TOPROL -XL) 50 MG 24 hr tablet TAKE ONE TABLET BY MOUTH ONCE DAILY WITH FOOD 90 tablet 3   Multiple Vitamin (MULTIVITAMIN WITH MINERALS) TABS Take 1 tablet by mouth daily.     Omega-3 Fatty Acids (FISH OIL) 1000 MG CAPS Take 1 capsule by mouth in the morning and at bedtime.     promethazine -dextromethorphan (PROMETHAZINE -DM) 6.25-15 MG/5ML syrup Take 5 mLs by mouth 4 (four) times daily as needed. 118 mL 0   valsartan  (DIOVAN ) 320 MG tablet Take 1 tablet (320 mg total) by mouth every morning. 90 tablet 3   Vitamin D , Ergocalciferol , (DRISDOL ) 1.25 MG (50000 UNIT) CAPS capsule TAKE 1 CAPSULE BY MOUTH EVERY 7 DAYS 12 capsule 1   vitamin E  400 UNIT capsule Take 400 Units by mouth daily.     No current facility-administered medications on file prior to visit.  "

## 2024-09-30 ENCOUNTER — Other Ambulatory Visit: Payer: Self-pay | Admitting: Family

## 2024-09-30 ENCOUNTER — Ambulatory Visit: Payer: Self-pay | Admitting: Family

## 2024-09-30 DIAGNOSIS — R7989 Other specified abnormal findings of blood chemistry: Secondary | ICD-10-CM

## 2024-09-30 DIAGNOSIS — I10 Essential (primary) hypertension: Secondary | ICD-10-CM

## 2024-09-30 DIAGNOSIS — Z79899 Other long term (current) drug therapy: Secondary | ICD-10-CM

## 2024-09-30 DIAGNOSIS — R5383 Other fatigue: Secondary | ICD-10-CM

## 2024-09-30 MED ORDER — FUROSEMIDE 20 MG PO TABS: 20.0000 mg | ORAL_TABLET | Freq: Two times a day (BID) | ORAL | 3 refills | Status: AC

## 2024-10-02 ENCOUNTER — Encounter (HOSPITAL_BASED_OUTPATIENT_CLINIC_OR_DEPARTMENT_OTHER): Payer: Self-pay | Admitting: Emergency Medicine

## 2024-10-02 ENCOUNTER — Emergency Department (HOSPITAL_BASED_OUTPATIENT_CLINIC_OR_DEPARTMENT_OTHER): Admitting: Radiology

## 2024-10-02 ENCOUNTER — Other Ambulatory Visit: Payer: Self-pay

## 2024-10-02 ENCOUNTER — Emergency Department (HOSPITAL_BASED_OUTPATIENT_CLINIC_OR_DEPARTMENT_OTHER)
Admission: EM | Admit: 2024-10-02 | Discharge: 2024-10-02 | Disposition: A | Discharge status: Home / Self Care | Attending: Emergency Medicine | Admitting: Emergency Medicine

## 2024-10-02 DIAGNOSIS — Z794 Long term (current) use of insulin: Secondary | ICD-10-CM | POA: Insufficient documentation

## 2024-10-02 DIAGNOSIS — M25512 Pain in left shoulder: Secondary | ICD-10-CM | POA: Insufficient documentation

## 2024-10-02 DIAGNOSIS — Z7984 Long term (current) use of oral hypoglycemic drugs: Secondary | ICD-10-CM | POA: Diagnosis not present

## 2024-10-02 DIAGNOSIS — Z7982 Long term (current) use of aspirin: Secondary | ICD-10-CM | POA: Diagnosis not present

## 2024-10-02 DIAGNOSIS — M546 Pain in thoracic spine: Secondary | ICD-10-CM | POA: Diagnosis not present

## 2024-10-02 DIAGNOSIS — E119 Type 2 diabetes mellitus without complications: Secondary | ICD-10-CM | POA: Insufficient documentation

## 2024-10-02 DIAGNOSIS — R079 Chest pain, unspecified: Secondary | ICD-10-CM | POA: Insufficient documentation

## 2024-10-02 LAB — CBC WITH DIFFERENTIAL/PLATELET
Abs Immature Granulocytes: 0.02 K/uL (ref 0.00–0.07)
Basophils Absolute: 0 K/uL (ref 0.0–0.1)
Basophils Relative: 1 %
Eosinophils Absolute: 0.1 K/uL (ref 0.0–0.5)
Eosinophils Relative: 2 %
HCT: 38.3 % (ref 36.0–46.0)
Hemoglobin: 12.8 g/dL (ref 12.0–15.0)
Immature Granulocytes: 0 %
Lymphocytes Relative: 47 %
Lymphs Abs: 3.9 K/uL (ref 0.7–4.0)
MCH: 29.6 pg (ref 26.0–34.0)
MCHC: 33.4 g/dL (ref 30.0–36.0)
MCV: 88.7 fL (ref 80.0–100.0)
Monocytes Absolute: 0.6 K/uL (ref 0.1–1.0)
Monocytes Relative: 8 %
Neutro Abs: 3.4 K/uL (ref 1.7–7.7)
Neutrophils Relative %: 42 %
Platelets: 177 K/uL (ref 150–400)
RBC: 4.32 MIL/uL (ref 3.87–5.11)
RDW: 12.9 % (ref 11.5–15.5)
WBC: 8 K/uL (ref 4.0–10.5)
nRBC: 0 % (ref 0.0–0.2)

## 2024-10-02 LAB — COMPREHENSIVE METABOLIC PANEL WITH GFR
ALT: 27 U/L (ref 0–44)
AST: 28 U/L (ref 15–41)
Albumin: 4.2 g/dL (ref 3.5–5.0)
Alkaline Phosphatase: 57 U/L (ref 38–126)
Anion gap: 11 (ref 5–15)
BUN: 15 mg/dL (ref 8–23)
CO2: 27 mmol/L (ref 22–32)
Calcium: 10.6 mg/dL — ABNORMAL HIGH (ref 8.9–10.3)
Chloride: 103 mmol/L (ref 98–111)
Creatinine, Ser: 0.74 mg/dL (ref 0.44–1.00)
GFR, Estimated: >60 mL/min
Glucose, Bld: 140 mg/dL — ABNORMAL HIGH (ref 70–99)
Potassium: 3.5 mmol/L (ref 3.5–5.1)
Sodium: 140 mmol/L (ref 135–145)
Total Bilirubin: 0.7 mg/dL (ref 0.0–1.2)
Total Protein: 6.8 g/dL (ref 6.5–8.1)

## 2024-10-02 LAB — TROPONIN T, HIGH SENSITIVITY: Troponin T High Sensitivity: 23 ng/L — ABNORMAL HIGH (ref 0–19)

## 2024-10-02 NOTE — ED Triage Notes (Signed)
 Reports left shoulder pain x 2 days. Denies injury. States pain rads into upper back.   State no pain right now.

## 2024-10-02 NOTE — ED Provider Notes (Signed)
 " [place] EMERGENCY DEPARTMENT AT [facility] Provider Note   CSN: [id number] Arrival date & time: 10/02/24  1311     Patient presents with: Shoulder Pain   [name] is a 78 y.o. female.  With a history of diabetes GERD osteoarthritis who presents to the ED for shoulder pain.  2 to 3 days of left upper back left shoulder pain there is reporting some left-sided chest pain as well.  No inciting injury.  Pain seems to come and go randomly.  No shortness of breath fevers chills    Shoulder Pain      Prior to Admission medications  Medication Sig Start Date End Date Taking? Authorizing Provider  albuterol  (VENTOLIN  HFA) 108 (90 Base) MCG/ACT inhaler INHALE 2 PUFFS INTO THE LUNGS EVERY 6 HOURS AS NEEDED FOR WHEEZING 08/26/24   Lowne Chase, Yvonne R, DO  Alcohol Swabs (B-D SINGLE USE SWABS REGULAR) PADS USE AS DIRECTED 03/15/20   Antonio Meth, Jamee SAUNDERS, DO  alendronate  (FOSAMAX ) 70 MG tablet TAKE ONE TABLET BY MOUTH ONCE WEEKLY ON WEDNESDAY 05/05/24   Lowne Chase, Yvonne R, DO  ALPRAZolam  (XANAX ) 0.5 MG tablet Take 1 tablet (0.5 mg total) by mouth 2 (two) times daily as needed for anxiety. 06/23/24   Antonio Meth Jamee SAUNDERS, DO  amLODipine  (NORVASC ) 10 MG tablet Take 1 tablet (10 mg total) by mouth daily. 05/05/24   Antonio Meth Jamee SAUNDERS, DO  amoxicillin -clavulanate (AUGMENTIN ) 875-125 MG tablet Take 1 tablet by mouth 2 (two) times daily. 08/04/24   Antonio Meth Jamee SAUNDERS, DO  aspirin  EC 81 MG tablet Take 81 mg by mouth daily.    [provider]  atorvastatin  (LIPITOR) 80 MG tablet Take 1 tablet (80 mg total) by mouth every evening. 03/04/24   Antonio Meth, Jamee SAUNDERS, DO  azithromycin  (ZITHROMAX  Z-PAK) 250 MG tablet Take 1 tablet (250 mg total) by mouth daily. Take as directed 08/03/24   Horton, Roxie HERO, DO  benzonatate  (TESSALON ) 200 MG capsule Take 1 capsule (200 mg total) by mouth 2 (two) times daily as needed for cough. 08/14/24   Antonio Meth Jamee SAUNDERS, DO  buPROPion   (WELLBUTRIN  XL) 300 MG 24 hr tablet Take 1 tablet (300 mg total) by mouth daily. 07/12/23   Lowne Chase, Yvonne R, DO  Carboxymethylcellulose Sodium (THERATEARS OP) Place 1 drop into both eyes daily as needed (dry eyes).    [provider]  Ciclopirox  1 % shampoo Massage into scalp 2x a week for 4 weeks 03/09/24   Antonio Meth, Jamee SAUNDERS, DO  Continuous Glucose Receiver (DEXCOM G6 RECEIVER) DEVI Use to monitor glucose 01/09/24   Shamleffer, Donell Cardinal, MD  Continuous Glucose Sensor (DEXCOM G7 SENSOR) MISC 1 Device by Does not apply route as directed. Every 10 days 07/24/24   Shamleffer, Ibtehal Jaralla, MD  dapagliflozin  propanediol (FARXIGA ) 10 MG TABS tablet Take 1 tablet (10 mg total) by mouth daily before breakfast. 08/03/24   Walker, Caitlin S, NP  dapagliflozin  propanediol (FARXIGA ) 10 MG TABS tablet Take 1 tablet (10 mg total) by mouth daily before breakfast. 08/03/24   Walker, Caitlin S, NP  fenofibrate  160 MG tablet Take 1 tablet (160 mg total) by mouth every evening. 05/05/24   Antonio Meth, Jamee SAUNDERS, DO  ferrous sulfate  325 (65 FE) MG tablet Take 325 mg by mouth daily with breakfast.     [provider]  fexofenadine (ALLEGRA) 180 MG tablet Take 180 mg by mouth daily as needed for allergies or rhinitis.  [provider]  FLUoxetine  (PROZAC ) 20 MG capsule Take 60 mg by mouth every morning. 09/18/24   [provider]  fluticasone  (FLONASE ) 50 MCG/ACT nasal spray INSTILL 2 SPRAYS IN EACH NOSTRIL DAILY 01/14/23   Antonio Meth, Yvonne R, DO  fluticasone -salmeterol (ADVAIR HFA) 115-21 MCG/ACT inhaler Inhale 2 puffs into the lungs 2 (two) times daily. 08/04/24   Antonio Meth Jamee JONELLE, DO  furosemide  (LASIX ) 20 MG tablet Take 1 tablet (20 mg total) by mouth 2 (two) times daily. 09/30/24   Douglass Caul B, FNP  glucose blood (CONTOUR NEXT TEST) test strip Use to check blood sugar up to three times daily 04/29/24   Shamleffer, Ibtehal Jaralla, MD  glucose blood (TRUE  METRIX BLOOD GLUCOSE TEST) test strip USE AS DIRECTED 4 TIMES A DAY 10/27/19   Antonio Meth Jamee JONELLE, DO  insulin  aspart (NOVOLOG ) 100 UNIT/ML injection Max daily 60 units 09/02/24   Shamleffer, Donell Cardinal, MD  Insulin  Disposable Pump (OMNIPOD 5 G7 INTRO, GEN 5,) KIT 1 Device by Does not apply route every other day. 03/31/24   Shamleffer, Donell Cardinal, MD  Insulin  Disposable Pump (OMNIPOD 5 G7 PODS, GEN 5,) MISC 1 Device by Does not apply route every 3 (three) days. 03/31/24   Shamleffer, Ibtehal Jaralla, MD  Insulin  Pen Needle (COMFORT EZ PEN NEEDLES) 32G X 4 MM MISC 1 Device by Other route in the morning, at noon, in the evening, and at bedtime. as directed 05/21/23   Shamleffer, Ibtehal Jaralla, MD  Iron , Ferrous Sulfate , 325 (65 Fe) MG TABS Take 325 mg by mouth daily. 06/26/24   Antonio Meth Jamee R, DO  metoprolol  succinate (TOPROL -XL) 50 MG 24 hr tablet TAKE ONE TABLET BY MOUTH ONCE DAILY WITH FOOD 05/05/24   Lowne Chase, Yvonne R, DO  Multiple Vitamin (MULTIVITAMIN WITH MINERALS) TABS Take 1 tablet by mouth daily.    [provider]  Omega-3 Fatty Acids (FISH OIL) 1000 MG CAPS Take 1 capsule by mouth in the morning and at bedtime.    [provider]  omeprazole  (PRILOSEC) 40 MG capsule Take 1 capsule (40 mg total) by mouth daily. 09/29/24   Webb, Padonda B, FNP  promethazine -dextromethorphan (PROMETHAZINE -DM) 6.25-15 MG/5ML syrup Take 5 mLs by mouth 4 (four) times daily as needed. 08/09/24   Antonio Meth Jamee JONELLE, DO  valsartan  (DIOVAN ) 320 MG tablet Take 1 tablet (320 mg total) by mouth every morning. 05/05/24   Antonio Meth, Yvonne R, DO  Vitamin D , Ergocalciferol , (DRISDOL ) 1.25 MG (50000 UNIT) CAPS capsule TAKE 1 CAPSULE BY MOUTH EVERY 7 DAYS 08/24/24   Lowne Chase, Yvonne R, DO  vitamin E  400 UNIT capsule Take 400 Units by mouth daily.    [provider]    Allergies: Metformin and related, Levemir [insulin  detemir], Clindamycin, and Crestor  [rosuvastatin   calcium ]    Review of Systems  Updated Vital Signs BP (!) 163/70   Pulse (!) 56   Temp 98.7 F (37.1 C) (Oral)   Resp 18   SpO2 100%   Physical Exam Vitals and nursing note reviewed.  HENT:     Head: Normocephalic and atraumatic.  Eyes:     Pupils: Pupils are equal, round, and reactive to light.  Cardiovascular:     Rate and Rhythm: Normal rate and regular rhythm.  Pulmonary:     Effort: Pulmonary effort is normal.     Breath sounds: Normal breath sounds.  Abdominal:     Palpations: Abdomen is soft.  Tenderness: There is no abdominal tenderness.  Musculoskeletal:     Comments: No tenderness or deformity of left shoulder full active range of motion with sensation tact light touch in left upper extremity  Skin:    General: Skin is warm and dry.  Neurological:     Mental Status: She is alert.  Psychiatric:        Mood and Affect: Mood normal.     (all labs ordered are listed, but only abnormal results are displayed) Labs Reviewed  COMPREHENSIVE METABOLIC PANEL WITH GFR - Abnormal; Notable for the following components:      Result Value   Glucose, Bld 140 (*)    Calcium  10.6 (*)    All other components within normal limits  TROPONIN T, HIGH SENSITIVITY - Abnormal; Notable for the following components:   Troponin T High Sensitivity 23 (*)    All other components within normal limits  CBC WITH DIFFERENTIAL/PLATELET    EKG: None  Radiology: DG Chest 2 View Result Date: 10/02/2024 CLINICAL DATA:  Chest pain EXAM: CHEST - 2 VIEW COMPARISON:  Eight days ago FINDINGS: The heart size and mediastinal contours are within normal limits. Minimal bibasilar subsegmental atelectasis. The visualized skeletal structures are unremarkable. IMPRESSION: Minimal bibasilar subsegmental atelectasis. Electronically Signed   By: Lynwood Landy Raddle M.D.   On: 10/02/2024 14:31   DG Shoulder Left Result Date: 10/02/2024 CLINICAL DATA:  Left arm pain EXAM: LEFT SHOULDER - 2+ VIEW  COMPARISON:  None Available. FINDINGS: There is no evidence of fracture or dislocation. There is no evidence of arthropathy or other focal bone abnormality. Soft tissues are unremarkable. IMPRESSION: Negative. Electronically Signed   By: Lynwood Landy Raddle M.D.   On: 10/02/2024 14:30     Procedures   Medications Ordered in the ED - No data to display                                  Medical Decision Making 78 year old female with history as above presenting for atraumatic left shoulder pain.  Also reporting some of chest pain.  Troponin of 23 is down trended from troponin last week.  No active pain at this time.  Suspicion for ACS.  EKG without ischemic changes.  Remainder of laboratory workup unremarkable.  Chest x-ray clear and shoulder x-ray unremarkable.  May be musculoskeletal strain sprain.  Counseled patient on symptomatic management and instructed for PCP follow-up  Amount and/or Complexity of Data Reviewed Labs: ordered. Radiology: ordered.        Final diagnoses:  Acute pain of left shoulder    ED Discharge Orders     None          Pamella Ozell LABOR, DO 10/02/24 2004  "

## 2024-10-02 NOTE — Discharge Instructions (Signed)
 You were seen in the emerged from for shoulder pain There was no evidence of heart attack The x-ray of your shoulder looks okay This may be related to sore muscles around the shoulder Take Tylenol  as directed for pain control Return to the emergency room for severe chest pain trouble breathing or other concerns Otherwise follow-up with your doctor

## 2024-10-13 ENCOUNTER — Ambulatory Visit (INDEPENDENT_AMBULATORY_CARE_PROVIDER_SITE_OTHER): Admitting: Family Medicine

## 2024-10-13 ENCOUNTER — Encounter: Payer: Self-pay | Admitting: Family Medicine

## 2024-10-13 VITALS — BP 138/68 | HR 78 | Temp 97.9°F | Resp 18 | Ht 65.0 in | Wt 192.4 lb

## 2024-10-13 DIAGNOSIS — M25512 Pain in left shoulder: Secondary | ICD-10-CM

## 2024-10-13 DIAGNOSIS — G8929 Other chronic pain: Secondary | ICD-10-CM | POA: Diagnosis not present

## 2024-10-13 DIAGNOSIS — R600 Localized edema: Secondary | ICD-10-CM | POA: Insufficient documentation

## 2024-10-13 LAB — COMPREHENSIVE METABOLIC PANEL WITH GFR
ALT: 20 U/L (ref 3–35)
AST: 22 U/L (ref 5–37)
Albumin: 4.3 g/dL (ref 3.5–5.2)
Alkaline Phosphatase: 45 U/L (ref 39–117)
BUN: 18 mg/dL (ref 6–23)
CO2: 32 meq/L (ref 19–32)
Calcium: 10.1 mg/dL (ref 8.4–10.5)
Chloride: 105 meq/L (ref 96–112)
Creatinine, Ser: 0.86 mg/dL (ref 0.40–1.20)
GFR: 64.55 mL/min
Glucose, Bld: 131 mg/dL — ABNORMAL HIGH (ref 70–99)
Potassium: 3.5 meq/L (ref 3.5–5.1)
Sodium: 143 meq/L (ref 135–145)
Total Bilirubin: 0.6 mg/dL (ref 0.2–1.2)
Total Protein: 6.6 g/dL (ref 6.0–8.3)

## 2024-10-13 NOTE — Assessment & Plan Note (Signed)
 No arthritis  Pain comes and goes Con't as needed tylenol  Ice/ heat  Massage Pt will contact us  if symptoms worsen--- consider sport med referral Er visit reviewed

## 2024-10-13 NOTE — Progress Notes (Signed)
 "  Subjective:    Patient ID: [name], female    DOB: Dec 03, 1945, 79 y.o.   MRN: [id number]  Chief Complaint  Patient presents with   ED follow up    HPI Patient is in today for L shoulder pain,  f/u from Er.  Discussed the use of AI scribe software for clinical note transcription with the patient, who gave verbal consent to proceed.  History of Present Illness [name] is a 79 year old female who presents with shoulder pain and shortness of breath.  She experiences left shoulder pain that radiates to her back, primarily occurring at night when lying on her left side. The pain is intermittent, with the most recent episode occurring once since the initial event. Lifting her arm or holding heavy objects exacerbates the pain. She takes Tylenol  for relief. No current shoulder pain, but it sometimes hurts at night and when lifting heavy objects.  She has episodes of shortness of breath, primarily with exertion. She has visited the emergency room twice, once for shortness of breath and once for shoulder pain. A chest x-ray initially showed an enlarged heart and some fluid around the heart, according to the patient, but a repeat x-ray was performed after Christmas. She is scheduled to see a cardiologist on the 20th of this month.  She is taking Lasix  twice a day, which helps reduce swelling in her ankles, although some swelling persists, especially in the evenings after being on her feet all day. She has lost four pounds since October.  Her blood sugar levels have been mostly high despite taking her medication as directed. She reports a blood sugar level of 112 this morning but notes that it increases after eating, even with insulin  use.    Past Medical History:  Diagnosis Date   Blockage of coronary artery of heart (HCC)    Depression    Diabetes mellitus    Hyperlipidemia    Hypertension    Pancreatitis    Restless leg syndrome     Past Surgical History:  Procedure  Laterality Date   CATARACT EXTRACTION, BILATERAL     CORONARY ANGIOPLASTY WITH STENT PLACEMENT     FOOT SURGERY Right    cyst removal   ROTATOR CUFF REPAIR     right   ROTATOR CUFF REPAIR Left    TUBAL LIGATION      Family History  Problem Relation Age of Onset   Heart attack Father    Heart attack Brother    Diabetes Sister    Cirrhosis Sister    Hypertension Sister    Cancer Mother    Hypertension Mother    Vision loss Mother    Cancer Sister    Sleep apnea Sister    Heart Problems Sister    Diabetes Sister    Gout Sister    Diabetes Son    Heart attack Son    Diabetes Daughter    Goiter Daughter    Diabetes Daughter     Social History   Socioeconomic History   Marital status: Married    Spouse name: Not on file   Number of children: Not on file   Years of education: Not on file   Highest education level: Not on file  Occupational History   Not on file  Tobacco Use   Smoking status: Former    Current packs/day: 0.00    Average packs/day: 0.2 packs/day    Types: Cigarettes    Quit date: 05/05/2020  Years since quitting: 4.4    Passive exposure: Current (and in the past as well)   Smokeless tobacco: Never   Tobacco comments:    quit in 2021  Vaping Use   Vaping status: Never Used  Substance and Sexual Activity   Alcohol use: No   Drug use: No   Sexual activity: Not Currently  Other Topics Concern   Not on file  Social History Narrative   Exercise: seldom   Caffeine use: seldom   Social Drivers of Health   Tobacco Use: Medium Risk (10/13/2024)   Patient History    Smoking Tobacco Use: Former    Smokeless Tobacco Use: Never    Passive Exposure: Current  Physicist, Medical Strain: Low Risk (06/09/2024)   Overall Financial Resource Strain (CARDIA)    Difficulty of Paying Living Expenses: Not very hard  Food Insecurity: No Food Insecurity (06/09/2024)   Epic    Worried About Radiation Protection Practitioner of Food in the Last Year: Never true    Ran Out of Food in the  Last Year: Never true  Transportation Needs: No Transportation Needs (06/09/2024)   Epic    Lack of Transportation (Medical): No    Lack of Transportation (Non-Medical): No  Physical Activity: Inactive (06/09/2024)   Exercise Vital Sign    Days of Exercise per Week: 0 days    Minutes of Exercise per Session: 0 min  Stress: No Stress Concern Present (06/09/2024)   [employer] of Occupational Health - Occupational Stress Questionnaire    Feeling of Stress: Not at all  Social Connections: Moderately Integrated (06/09/2024)   Social Connection and Isolation Panel    Frequency of Communication with Friends and Family: More than three times a week    Frequency of Social Gatherings with Friends and Family: More than three times a week    Attends Religious Services: 1 to 4 times per year    Active Member of [employer] or Organizations: No    Attends Banker Meetings: Never    Marital Status: Married  Catering Manager Violence: Not At Risk (06/09/2024)   Epic    Fear of Current or Ex-Partner: No    Emotionally Abused: No    Physically Abused: No    Sexually Abused: No  Depression (PHQ2-9): Medium Risk (08/04/2024)   Depression (PHQ2-9)    PHQ-2 Score: 10  Alcohol Screen: Low Risk (06/09/2024)   Alcohol Screen    Last Alcohol Screening Score (AUDIT): 0  Housing: Unknown (06/09/2024)   Epic    Unable to Pay for Housing in the Last Year: No    Number of Times Moved in the Last Year: Not on file    Homeless in the Last Year: No  Utilities: Not At Risk (06/09/2024)   Epic    Threatened with loss of utilities: No  Health Literacy: Adequate Health Literacy (06/09/2024)   B1300 Health Literacy    Frequency of need for help with medical instructions: Never    Outpatient Medications Prior to Visit  Medication Sig Dispense Refill   albuterol  (VENTOLIN  HFA) 108 (90 Base) MCG/ACT inhaler INHALE 2 PUFFS INTO THE LUNGS EVERY 6 HOURS AS NEEDED FOR WHEEZING 8.5 g 2   Alcohol Swabs (B-D SINGLE USE  SWABS REGULAR) PADS USE AS DIRECTED 400 each 1   alendronate  (FOSAMAX ) 70 MG tablet TAKE ONE TABLET BY MOUTH ONCE WEEKLY ON WEDNESDAY 12 tablet 3   ALPRAZolam  (XANAX ) 0.5 MG tablet Take 1 tablet (0.5 mg total) by mouth 2 (two)  times daily as needed for anxiety. 60 tablet 1   amLODipine  (NORVASC ) 10 MG tablet Take 1 tablet (10 mg total) by mouth daily. 90 tablet 3   aspirin  EC 81 MG tablet Take 81 mg by mouth daily.     atorvastatin  (LIPITOR) 80 MG tablet Take 1 tablet (80 mg total) by mouth every evening. 90 tablet 1   azithromycin  (ZITHROMAX  Z-PAK) 250 MG tablet Take 1 tablet (250 mg total) by mouth daily. Take as directed 6 tablet 0   benzonatate  (TESSALON ) 200 MG capsule Take 1 capsule (200 mg total) by mouth 2 (two) times daily as needed for cough. 20 capsule 0   buPROPion  (WELLBUTRIN  XL) 300 MG 24 hr tablet Take 1 tablet (300 mg total) by mouth daily. 90 tablet 3   Carboxymethylcellulose Sodium (THERATEARS OP) Place 1 drop into both eyes daily as needed (dry eyes).     Ciclopirox  1 % shampoo Massage into scalp 2x a week for 4 weeks 120 mL 0   Continuous Glucose Receiver (DEXCOM G6 RECEIVER) DEVI Use to monitor glucose 1 each 0   Continuous Glucose Sensor (DEXCOM G7 SENSOR) MISC 1 Device by Does not apply route as directed. Every 10 days 9 each 3   dapagliflozin  propanediol (FARXIGA ) 10 MG TABS tablet Take 1 tablet (10 mg total) by mouth daily before breakfast. 30 tablet 5   dapagliflozin  propanediol (FARXIGA ) 10 MG TABS tablet Take 1 tablet (10 mg total) by mouth daily before breakfast. 14 tablet 0   fenofibrate  160 MG tablet Take 1 tablet (160 mg total) by mouth every evening. 90 tablet 3   ferrous sulfate  325 (65 FE) MG tablet Take 325 mg by mouth daily with breakfast.      fexofenadine (ALLEGRA) 180 MG tablet Take 180 mg by mouth daily as needed for allergies or rhinitis.     FLUoxetine  (PROZAC ) 20 MG capsule Take 60 mg by mouth every morning.     fluticasone  (FLONASE ) 50 MCG/ACT nasal  spray INSTILL 2 SPRAYS IN EACH NOSTRIL DAILY 48 g 1   fluticasone -salmeterol (ADVAIR HFA) 115-21 MCG/ACT inhaler Inhale 2 puffs into the lungs 2 (two) times daily. 1 each 12   furosemide  (LASIX ) 20 MG tablet Take 1 tablet (20 mg total) by mouth 2 (two) times daily. 180 tablet 3   glucose blood (CONTOUR NEXT TEST) test strip Use to check blood sugar up to three times daily 100 each 12   glucose blood (TRUE METRIX BLOOD GLUCOSE TEST) test strip USE AS DIRECTED 4 TIMES A DAY 400 strip 1   insulin  aspart (NOVOLOG ) 100 UNIT/ML injection Max daily 60 units 60 mL 3   Insulin  Disposable Pump (OMNIPOD 5 G7 INTRO, GEN 5,) KIT 1 Device by Does not apply route every other day. 1 kit 0   Insulin  Disposable Pump (OMNIPOD 5 G7 PODS, GEN 5,) MISC 1 Device by Does not apply route every 3 (three) days. 45 each 3   Insulin  Pen Needle (COMFORT EZ PEN NEEDLES) 32G X 4 MM MISC 1 Device by Other route in the morning, at noon, in the evening, and at bedtime. as directed 400 each 3   Iron , Ferrous Sulfate , 325 (65 Fe) MG TABS Take 325 mg by mouth daily. 30 tablet 2   metoprolol  succinate (TOPROL -XL) 50 MG 24 hr tablet TAKE ONE TABLET BY MOUTH ONCE DAILY WITH FOOD 90 tablet 3   Multiple Vitamin (MULTIVITAMIN WITH MINERALS) TABS Take 1 tablet by mouth daily.     Omega-3 Fatty  Acids (FISH OIL) 1000 MG CAPS Take 1 capsule by mouth in the morning and at bedtime.     omeprazole  (PRILOSEC) 40 MG capsule Take 1 capsule (40 mg total) by mouth daily. 30 capsule 3   promethazine -dextromethorphan (PROMETHAZINE -DM) 6.25-15 MG/5ML syrup Take 5 mLs by mouth 4 (four) times daily as needed. 118 mL 0   valsartan  (DIOVAN ) 320 MG tablet Take 1 tablet (320 mg total) by mouth every morning. 90 tablet 3   Vitamin D , Ergocalciferol , (DRISDOL ) 1.25 MG (50000 UNIT) CAPS capsule TAKE 1 CAPSULE BY MOUTH EVERY 7 DAYS 12 capsule 1   vitamin E  400 UNIT capsule Take 400 Units by mouth daily.     amoxicillin -clavulanate (AUGMENTIN ) 875-125 MG tablet Take  1 tablet by mouth 2 (two) times daily. 20 tablet 0   No facility-administered medications prior to visit.    Allergies[1]  Review of Systems  Constitutional:  Negative for fever and malaise/fatigue.  HENT:  Negative for congestion.   Eyes:  Negative for blurred vision.  Respiratory:  Negative for shortness of breath.   Cardiovascular:  Negative for chest pain, palpitations and leg swelling.  Gastrointestinal:  Negative for abdominal pain, blood in stool and nausea.  Genitourinary:  Negative for dysuria and frequency.  Musculoskeletal:  Positive for joint pain. Negative for falls and neck pain.  Skin:  Negative for rash.  Neurological:  Negative for dizziness, loss of consciousness and headaches.  Endo/Heme/Allergies:  Negative for environmental allergies.  Psychiatric/Behavioral:  Negative for depression. The patient is not nervous/anxious.        Objective:    Physical Exam Vitals and nursing note reviewed.     BP 138/68 (BP Location: Left Arm, Patient Position: Sitting, Cuff Size: Large)   Pulse 78   Temp 97.9 F (36.6 C) (Oral)   Resp 18   Ht 5' 5 (1.651 m)   Wt 192 lb 6.4 oz (87.3 kg)   BMI 32.02 kg/m  Wt Readings from Last 3 Encounters:  10/13/24 192 lb 6.4 oz (87.3 kg)  09/29/24 192 lb 3.2 oz (87.2 kg)  08/04/24 196 lb 6.4 oz (89.1 kg)    Diabetic Foot Exam - Simple   No data filed    Lab Results  Component Value Date   WBC 8.0 10/02/2024   HGB 12.8 10/02/2024   HCT 38.3 10/02/2024   PLT 177 10/02/2024   GLUCOSE 140 (H) 10/02/2024   CHOL 144 08/11/2024   TRIG 234 (H) 08/11/2024   HDL 30 (L) 08/11/2024   LDLDIRECT 53.0 04/24/2021   LDLCALC 75 08/11/2024   ALT 27 10/02/2024   AST 28 10/02/2024   NA 140 10/02/2024   K 3.5 10/02/2024   CL 103 10/02/2024   CREATININE 0.74 10/02/2024   BUN 15 10/02/2024   CO2 27 10/02/2024   TSH 2.85 09/29/2024   INR 1.1 03/03/2009   HGBA1C 7.9 (A) 07/24/2024   MICROALBUR 9.9 07/12/2023    Lab Results   Component Value Date   TSH 2.85 09/29/2024   Lab Results  Component Value Date   WBC 8.0 10/02/2024   HGB 12.8 10/02/2024   HCT 38.3 10/02/2024   MCV 88.7 10/02/2024   PLT 177 10/02/2024   Lab Results  Component Value Date   NA 140 10/02/2024   K 3.5 10/02/2024   CO2 27 10/02/2024   GLUCOSE 140 (H) 10/02/2024   BUN 15 10/02/2024   CREATININE 0.74 10/02/2024   BILITOT 0.7 10/02/2024   ALKPHOS 57 10/02/2024  AST 28 10/02/2024   ALT 27 10/02/2024   PROT 6.8 10/02/2024   ALBUMIN 4.2 10/02/2024   CALCIUM  10.6 (H) 10/02/2024   ANIONGAP 11 10/02/2024   EGFR 44 (L) 08/11/2024   GFR 69.38 09/29/2024   Lab Results  Component Value Date   CHOL 144 08/11/2024   Lab Results  Component Value Date   HDL 30 (L) 08/11/2024   Lab Results  Component Value Date   LDLCALC 75 08/11/2024   Lab Results  Component Value Date   TRIG 234 (H) 08/11/2024   Lab Results  Component Value Date   CHOLHDL 4.8 (H) 08/11/2024   Lab Results  Component Value Date   HGBA1C 7.9 (A) 07/24/2024       Assessment & Plan:  Peripheral edema Assessment & Plan: Inc lasix  to 40 mg in am and 20 at night for 3-4 days then go back to 20 bid  Elevated legs Compression socks   Orders: -     Comprehensive metabolic panel with GFR  Chronic left shoulder pain Assessment & Plan: No arthritis  Pain comes and goes Con't as needed tylenol  Ice/ heat  Massage Pt will contact us  if symptoms worsen--- consider sport med referral Er visit reviewed     Assessment and Plan Assessment & Plan Left shoulder pain, likely bursitis   Intermittent left shoulder pain, likely due to bursitis, worsens when lying on the left side or lifting heavy objects. X-ray shows no arthritis. Pain is intermittent and does not limit function. Continue Tylenol  for pain management. Use topical treatments like aspirin  with lidocaine  or Salonpas patch as needed. Consider stretching, massage, and alternating heat and ice for  muscle relaxation. Refer to sports medicine if pain worsens or limits function.  Peripheral edema   Persistent peripheral edema, especially in the evenings, likely due to fluid retention. Managed with Lasix  twice daily, reducing but not resolving swelling. Temporarily increase Lasix  dosage for a few days to manage fluid retention, then return to twice daily dosing. Monitor for leg cramps or other symptoms of electrolyte imbalance.  Type 2 diabetes mellitus with hyperglycemia   Type 2 diabetes with episodes of hyperglycemia despite adherence to medication regimen. Blood sugar levels are generally high, with occasional low readings. Continue current diabetes medication regimen.  Hypokalemia   Potential risk of hypokalemia due to increased Lasix  dosage. Potassium intake is monitored. Recheck potassium levels to ensure stability. Ensure daily intake of potassium-rich foods or supplements.    Emily JONELLE Shanks Chase, DO     [1]  Allergies Allergen Reactions   Metformin And Related Other (See Comments)    Sick on stomach, sweaty, throw up, pain   Levemir [Insulin  Detemir] Hives and Swelling   Clindamycin Nausea And Vomiting   Crestor  [Rosuvastatin  Calcium ] Palpitations   "

## 2024-10-13 NOTE — Assessment & Plan Note (Signed)
 Inc lasix  to 40 mg in am and 20 at night for 3-4 days then go back to 20 bid  Elevated legs Compression socks

## 2024-10-13 NOTE — Patient Instructions (Signed)
 Shoulder Pain  Many things can cause shoulder pain, including:  An injury.  Moving the shoulder in the same way again and again (overuse).  Joint pain (arthritis).  Pain can come from:  Swelling and irritation (inflammation) of any part of the shoulder.  An injury to:  The shoulder joint.  Tissues that connect muscle to bone (tendons).  Tissues that connect bones to each other (ligaments).  Bones.  Follow these instructions at home:  Watch for changes in your symptoms. Let your doctor know about them. Follow these instructions to help with your pain.  If you have a sling that can be taken off:  Wear the sling as told by your doctor. Take it off only as told by your doctor.  Check the skin around the sling every day. Tell your doctor if you see problems.  Loosen the sling if your fingers:  Tingle.  Become numb.  Become cold.  Keep the sling clean.  If the sling is not waterproof:  Do not let it get wet.  Take the sling off when you shower or bathe.  Managing pain, stiffness, and swelling    If told, put ice on the painful area.  Put ice in a plastic bag.  Place a towel between your skin and the bag.  Leave the ice on for 20 minutes, 2-3 times a day. Stop putting ice on if it does not help with the pain.  If your skin turns bright red, take off the ice right away to prevent skin damage. The risk of damage is higher if you cannot feel pain, heat, or cold.  Squeeze a soft ball or a foam pad as much as possible. This prevents swelling in the shoulder. It also helps to strengthen the arm.  General instructions  Take over-the-counter and prescription medicines only as told by your doctor.  Keep all follow-up visits. This will help you avoid any type of permanent shoulder problems.  Contact a doctor if:  Your pain gets worse.  Medicine does not help your pain.  You have new pain in your arm, hand, or fingers.  You loosen your sling and your arm, hand, or fingers:  Tingle.  Are numb.  Are swollen.  Get help right away  if:  Your arm, hand, or fingers turn white or blue.  This information is not intended to replace advice given to you by your health care provider. Make sure you discuss any questions you have with your health care provider.  Document Revised: 04/27/2022 Document Reviewed: 04/27/2022  Elsevier Patient Education  2024 [employer].

## 2024-10-14 ENCOUNTER — Other Ambulatory Visit (HOSPITAL_BASED_OUTPATIENT_CLINIC_OR_DEPARTMENT_OTHER): Payer: Self-pay | Admitting: Family

## 2024-10-14 DIAGNOSIS — Z79899 Other long term (current) drug therapy: Secondary | ICD-10-CM

## 2024-10-14 DIAGNOSIS — R7989 Other specified abnormal findings of blood chemistry: Secondary | ICD-10-CM

## 2024-10-14 DIAGNOSIS — I10 Essential (primary) hypertension: Secondary | ICD-10-CM

## 2024-10-14 DIAGNOSIS — R5383 Other fatigue: Secondary | ICD-10-CM

## 2024-10-17 ENCOUNTER — Ambulatory Visit: Payer: Self-pay | Admitting: Family Medicine

## 2024-10-26 LAB — LIPID PANEL
Chol/HDL Ratio: 3.6 ratio (ref 0.0–4.4)
Cholesterol, Total: 123 mg/dL (ref 100–199)
HDL: 34 mg/dL — ABNORMAL LOW
LDL Chol Calc (NIH): 69 mg/dL (ref 0–99)
Triglycerides: 108 mg/dL (ref 0–149)
VLDL Cholesterol Cal: 20 mg/dL (ref 5–40)

## 2024-10-26 LAB — BASIC METABOLIC PANEL WITH GFR
BUN/Creatinine Ratio: 15 (ref 12–28)
BUN: 15 mg/dL (ref 8–27)
CO2: 22 mmol/L (ref 20–29)
Calcium: 9.9 mg/dL (ref 8.7–10.3)
Chloride: 105 mmol/L (ref 96–106)
Creatinine, Ser: 1 mg/dL (ref 0.57–1.00)
Glucose: 191 mg/dL — ABNORMAL HIGH (ref 70–99)
Potassium: 4.2 mmol/L (ref 3.5–5.2)
Sodium: 142 mmol/L (ref 134–144)
eGFR: 58 mL/min/1.73 — ABNORMAL LOW

## 2024-10-27 ENCOUNTER — Ambulatory Visit: Admitting: Internal Medicine

## 2024-10-27 ENCOUNTER — Ambulatory Visit (HOSPITAL_BASED_OUTPATIENT_CLINIC_OR_DEPARTMENT_OTHER): Admitting: Cardiology

## 2024-10-27 ENCOUNTER — Encounter (HOSPITAL_BASED_OUTPATIENT_CLINIC_OR_DEPARTMENT_OTHER): Payer: Self-pay | Admitting: Cardiology

## 2024-10-27 VITALS — BP 142/64 | HR 60 | Ht 65.0 in | Wt 189.5 lb

## 2024-10-27 DIAGNOSIS — E782 Mixed hyperlipidemia: Secondary | ICD-10-CM

## 2024-10-27 DIAGNOSIS — E1165 Type 2 diabetes mellitus with hyperglycemia: Secondary | ICD-10-CM

## 2024-10-27 DIAGNOSIS — I110 Hypertensive heart disease with heart failure: Secondary | ICD-10-CM

## 2024-10-27 DIAGNOSIS — I2510 Atherosclerotic heart disease of native coronary artery without angina pectoris: Secondary | ICD-10-CM

## 2024-10-27 DIAGNOSIS — I10 Essential (primary) hypertension: Secondary | ICD-10-CM | POA: Diagnosis not present

## 2024-10-27 DIAGNOSIS — I272 Pulmonary hypertension, unspecified: Secondary | ICD-10-CM

## 2024-10-27 DIAGNOSIS — I5032 Chronic diastolic (congestive) heart failure: Secondary | ICD-10-CM

## 2024-10-27 MED ORDER — SPIRONOLACTONE 25 MG PO TABS: 25.0000 mg | ORAL_TABLET | Freq: Every day | ORAL | 3 refills | Status: AC

## 2024-10-27 NOTE — Progress Notes (Signed)
 " Cardiology Office Note:  .   Date:  10/27/2024  ID:  [name], DOB 1946/09/25, MRN [id number] PCP: [name], Emily SAUNDERS, DO  [facility] Providers Cardiologist:  Shelda Bruckner, MD {  History of Present Illness: .   [name] is a 79 y.o. female  with a hx of CAD s/p stent(s), type II diabetes on insulin , hyperlipidemia, hypertension who is seen for follow up today. She was initially seen 07/2018 as a new consult at the request of [name] [name], * for the evaluation and management of chest pain.   Cardiac history: history of cardiac stent(s)--not sure where, what type, or how many. Last cath in 2001 by Dr. Daria Charon. She denies any symptoms at the time.  Had echo and lexiscan  in 2017 when she was having fatigue and diaphoresis. No chest discomfort or shortness of breath at that time. Presented 05/2024 with recurrent chest pain. Myoview  06/22/24 with large defect in mid to basal lateral walls consistent with infarct, but no ischemia noted. Echo 07/17/24 with EF 60-65%, no WMA, moderate LVH, G2DD, severe LAE, small effusion, PASP 49. There was LVOT flow acceleration concerning for possible subaortic membrane, but this was more likely   Today: I have not seen the patient since 05/2020, but she has been seen by Reche Finder in the interim. Today she feels like she is overall doing well.   We reviewed her medications today. She notes that she had issues with yeast with farxiga  just like she did with the Jardiance . Notes that about 1-2 weeks ago she felt vaginal pruritus, noticed tongue was caked white, felt this was thrush. Didn't seek treatment for this, stopped taking Farxiga  and the symptoms would clear up, then she would restart the farxiga  but the symptoms would return 7-8 days later. Has to be off of it for about 2 weeks for symptoms to resolve. I will reach out to her PCP about this.  I don't see that she has ever been on spironolactone  or entresto. We  discussed this today.  She feels like she is holding on to fluid. Doesn't weigh herself at home. Notes that it is primarily swelling in her feet. No shortness of breath at rest or laying down, only with exertion. Taking lasix  20 mg BID, make her use the restroom a lot.   ROS: Denies chest pain, shortness of breath at rest or with normal exertion. No PND, orthopnea, LE edema or unexpected weight gain. No syncope or palpitations. ROS otherwise negative except as noted.   Studies Reviewed: SABRA    EKG:       Physical Exam:   VS:  BP (!) 142/64 (Cuff Size: Normal)   Pulse 60   Ht 5' 5 (1.651 m)   Wt 189 lb 8 oz (86 kg)   SpO2 94%   BMI 31.53 kg/m    Wt Readings from Last 3 Encounters:  10/27/24 189 lb 8 oz (86 kg)  10/13/24 192 lb 6.4 oz (87.3 kg)  09/29/24 192 lb 3.2 oz (87.2 kg)    GEN: Well nourished, well developed in no acute distress HEENT: Normal, moist mucous membranes NECK: No JVD CARDIAC: regular rhythm, normal S1 and S2, no rubs or gallops. No murmur. VASCULAR: Radial and DP pulses 2+ bilaterally. No carotid bruits RESPIRATORY:  Clear to auscultation without rales, wheezing or rhonchi  ABDOMEN: Soft, non-tender, non-distended MUSCULOSKELETAL:  Ambulates independently SKIN: Warm and dry, no pitting edema NEUROLOGIC:  Alert and oriented x  3. No focal neuro deficits noted. PSYCHIATRIC:  Normal affect    ASSESSMENT AND PLAN: .    History of CAD with stents, mixed hyperlipidemia -myoview  06/2024 with infarct but no ischemia, echo 07/2024 with normal EF/wall motion -continue aspirin  81 mg, atorvastatin  80 mg, fenofibrate  160 mg daily -LDL goal <55 given diabetes -lipids from yesterday show TG 108, HDL 34, LDL 69. Discussed goal. Given that we are changing HF meds, she wants to hold on adding additional lipid med at this time, will discuss once HF regimen optimized.   Chronic diastolic heart failure LVH Pulmonary hypertension -Prescribed Farxiga , issues with yeast as above.  Had same issues with Jardiance  -on metoprolol  succinate 50 mg daily. Avoid bradycardia, but suspect some of the LVOT flow acceleration is due to LVH, though there is also focal turbulence seen, cannot exclude subaortic membrane -on furosemide  20 mg BID -has not been on entresto or spironolactone . Discussed today. Given concern for fluid retention, will start with spironolactone  25 mg daily -if Cr/BP allow at follow up, would then change to entresto -has never had sleep study. Once meds titrated, would consider this, husband reports that she snores, may be adding to pulmonary pressures  Type II diabetes, on insulin : with ASCVD history -most recent A1c 7.9 -issues with yeast on Farxiga  and Jardiance  as above. Will reach out to her PCP   Hypertension: -on amlodipine  10 mg, valsartan  320 mg daily, metoprolol  succinate 50 mg daily -was 138/68 on visit with PCP on 1/6. Initially elevated today, recheck improved -adding spironolactone  as above  CV risk counseling and prevention -recommend heart healthy/Mediterranean diet, with whole grains, fruits, vegetable, fish, lean meats, nuts, and olive oil. Limit salt. -recommend moderate walking, 3-5 times/week for 30-50 minutes each session. Aim for at least 150 minutes/week. Goal should be pace of 3 miles/hours, or walking 1.5 miles in 30 minutes -recommend avoidance of tobacco products. Avoid excess alcohol.  Dispo: 2 weeks for BMET, 4-6 weeks for med titration  Total time of encounter: I spent 44 minutes dedicated to the care of this patient on the date of this encounter to include pre-visit review of records, face-to-face time with the patient discussing conditions above, and clinical documentation with the electronic health record. We specifically spent time today discussing diastolic heart failure, signs/symptoms, management, medications, side effects   Signed, Shelda Bruckner, MD   Shelda Bruckner, MD, PhD, [facility] [place]  [facility]  [facility]  [place]  [facility] at [facility] at [facility] [address] [place], [place] [address] [phone]   "

## 2024-10-27 NOTE — Patient Instructions (Addendum)
 STOP Farxiga  until you hear from Dr. Antonio Chase--I am ok with stopping this for good given the yeast issues, but she may need to adjust your diabetes medication if you have to come off of this for good. START spironolactone  25 mg daily. Recheck kidney function in about 2 weeks -Try to weigh yourself so we know if fluid is coming on -we may be able to cut back the lasix  (furosemide ) to once a day if the spironolactone  helps keep fluid off -if you can check your blood pressure, we are aiming for it to be less than 130/80 -if kidneys are ok and we still have room to go on the blood pressure, we may change to entresto from valsartan  at the next visit  Do the following things EVERY DAY:  Weigh yourself EVERY morning after you go to the bathroom but before you eat or drink anything. Write this number down in a weight log/diary. If you gain 3 pounds overnight or 5 pounds in a week, call the office.  Take your medicines as prescribed. If you have concerns about your medications, please call us  before you stop taking them.   Eat low salt foods--Limit salt (sodium) to 2000 mg per day. This will help prevent your body from holding onto fluid. Read food labels as many processed foods have a lot of sodium, especially canned goods and prepackaged meats. If you would like some assistance choosing low sodium foods, we would be happy to set you up with a nutritionist.  Stay as active as you can everyday. Staying active will give you more energy and make your muscles stronger. Start with 5 minutes at a time and work your way up to 30 minutes a day. Break up your activities--do some in the morning and some in the afternoon. Start with 3 days per week and work your way up to 5 days as you can.  If you have chest pain, feel short of breath, dizzy, or lightheaded, STOP. If you don't feel better after a short rest, call 911. If you do feel better, call the office to let us  know you have symptoms with exercise.  Limit all  fluids for the day to less than 2 liters. Fluid includes all drinks, coffee, juice, ice chips, soup, jello, and all other liquids.    How to check blood pressure:  -sit comfortably in a chair, feet uncrossed and flat on floor, for 5-10 minutes  -arm ideally should rest at the level of the heart. However, arm should be relaxed and not tense (for example, do not hold the arm up unsupported)  -avoid exercise, caffeine, and tobacco for at least 30 minutes prior to BP reading  -don't take BP cuff reading over clothes (always place on skin directly)  -I prefer to know how well the medication is working, so I would like you to take your readings 1-2 hours after taking your blood pressure medication if possible   Medication Instructions:  As above *If you need a refill on your cardiac medications before your next appointment, please call your pharmacy*  Lab Work: Return in 2 weeks for blood work DESIGNER, JEWELLERY)  Testing/Procedures: none  Follow-Up: At [facility], you and your health needs are our priority.  As part of our continuing mission to provide you with exceptional heart care, our providers are all part of one team.  This team includes your primary Cardiologist (physician) and Advanced Practice Providers or APPs (Physician Assistants and Nurse Practitioners) who all work together  to provide you with the care you need, when you need it.  Your next appointment:   4-6 week(s)  Provider:   Reche Finder, NP

## 2024-10-30 ENCOUNTER — Encounter: Payer: Self-pay | Admitting: Internal Medicine

## 2024-10-30 ENCOUNTER — Ambulatory Visit: Admitting: Internal Medicine

## 2024-10-30 ENCOUNTER — Encounter: Attending: Internal Medicine | Admitting: Dietician

## 2024-10-30 ENCOUNTER — Other Ambulatory Visit: Payer: Self-pay | Admitting: Internal Medicine

## 2024-10-30 ENCOUNTER — Other Ambulatory Visit

## 2024-10-30 ENCOUNTER — Telehealth: Payer: Self-pay | Admitting: Internal Medicine

## 2024-10-30 VITALS — BP 170/88 | Ht 65.0 in | Wt 190.0 lb

## 2024-10-30 DIAGNOSIS — E1159 Type 2 diabetes mellitus with other circulatory complications: Secondary | ICD-10-CM | POA: Diagnosis not present

## 2024-10-30 DIAGNOSIS — Z794 Long term (current) use of insulin: Secondary | ICD-10-CM

## 2024-10-30 DIAGNOSIS — E042 Nontoxic multinodular goiter: Secondary | ICD-10-CM | POA: Diagnosis not present

## 2024-10-30 DIAGNOSIS — E1165 Type 2 diabetes mellitus with hyperglycemia: Secondary | ICD-10-CM

## 2024-10-30 LAB — POCT GLYCOSYLATED HEMOGLOBIN (HGB A1C): Hemoglobin A1C: 10 % — AB (ref 4.0–5.6)

## 2024-10-30 MED ORDER — INSULIN ASPART 100 UNIT/ML IJ SOLN: INTRAMUSCULAR | 3 refills | Status: AC

## 2024-10-30 NOTE — Patient Instructions (Signed)
" °  Enter 18 grams of carbohydrates with brunch and 20 g of carbohydrates with supper     HOW TO TREAT LOW BLOOD SUGARS (Blood sugar LESS THAN 70 MG/DL) Please follow the RULE OF 15 for the treatment of hypoglycemia treatment (when your (blood sugars are less than 70 mg/dL)   STEP 1: Take 15 grams of carbohydrates when your blood sugar is low, which includes:  3-4 GLUCOSE TABS  OR 3-4 OZ OF JUICE OR REGULAR SODA OR ONE TUBE OF GLUCOSE GEL    STEP 2: RECHECK blood sugar in 15 MINUTES STEP 3: If your blood sugar is still low at the 15 minute recheck --> then, go back to STEP 1 and treat AGAIN with another 15 grams of carbohydrates. "

## 2024-10-30 NOTE — Telephone Encounter (Signed)
 Patient came back in to office today and picked up 2 samples of Dexcom G7 sensors.

## 2024-10-30 NOTE — Progress Notes (Signed)
 "   Name: [name]  Age/ Sex: 79 y.o., female   MRN/ DOB: [id number], 17-Mar-1946     PCP: Emily Cyndee Jamee JONELLE, DO   Reason for Endocrinology Evaluation: Type 2 Diabetes Mellitus  Initial Endocrine Consultative Visit: 01/17/2021    PATIENT IDENTIFIER: [name] is a 79 y.o. female with a past medical history of HTN, T2Dm, CAD , Osteoporosis and Hx of pancreatitis. The patient has followed with Endocrinology clinic since 4 f/09/2021 or consultative assistance with management of her diabetes.  DIABETIC HISTORY:  [name] was diagnosed with DM in 1998, has intolerance to Metformin due to Gi side effects. Her hemoglobin A1c has ranged from 9.3% in 2015, peaking at 10.1% in 2020.  She has history of Pancreatitis that she believes is caused Metformin   On her initial visit to our clinic she had an A1c of 9.4%  , she was on Jardiance ,Glimepiride  , Januvia and insulin  mix . We stopped Glimepiride  as she was not taking it, we stopped Januvia due to hx of pancreatitis. We continued jardiance  and adjusted insulin  mix   Switched insulin  Mix to Basal/prandial insulin  05/2021  The patient did stop Jardiance  in June, 2025 due to oral thrush, but she was restarted by cardiology in October, 2025  She was switched to the [facility] in July, 2025  Discontinued pioglitazone  due to edema October, 2025   SUBJECTIVE:   During the last visit (03/31/2024): A1c 8.2%    Today (10/30/2024): [name] is here for a follow up on diabetes management. She checks her blood sugars multiple times daily. The patient has not had hypoglycemic episodes since the last clinic visit.    Patient follows with cardiology for CAD, CHF, pulmonary hypertension, and dyslipidemia She was evaluated by her PCP for lower extremity edema, furosemide  dose adjusted  No nausea  LE improving  Continues with constipation    This patient with type 2 diabetes is treated with Omnipod  (insulin  pump). During the visit  the pump basal and bolus doses were reviewed including carb/insulin  rations and supplemental doses. The clinical list was updated. The glucose meter download was reviewed in detail to determine if the current pump settings are providing the best glycemic control without excessive hypoglycemia.  Pump and CGM download:       Pump   OmniPod Settings   Insulin  type   NovoLog    Basal rate       0000 1.45 u/h               I:C ratio       0000 1:1    Enter# 18g with breakfast and 20 g with supper              Sensitivity       0000  25      Goal       0000  120            Type & Model of Pump: omnipod Insulin  Type: Currently using NovoLog .  Body mass index is 31.62 kg/m.  PUMP STATISTICS:                 HOME DIABETES REGIMEN:  Farxiga  10 mg through cardiology NovoLog    Statin: yes ACE-I/ARB: yes   CONTINUOUS GLUCOSE MONITORING RECORD INTERPRETATION                      Glycemic patterns summary: BGs are elevated throughout the day and night  Hyperglycemic episodes most than nights  Hypoglycemic episodes occurred after bolus  Overnight periods: Remains high     DIABETIC COMPLICATIONS: Microvascular complications:  Neuropathy  Denies: CKD, retinopathy, neuropathy Last Eye Exam: Completed 01/2022  Macrovascular complications:  CAD ( S/P stent placement)  Denies: CVA, PVD   HISTORY:  Past Medical History:  Past Medical History:  Diagnosis Date   Blockage of coronary artery of heart (HCC)    Depression    Diabetes mellitus    Hyperlipidemia    Hypertension    Pancreatitis    Restless leg syndrome    Past Surgical History:  Past Surgical History:  Procedure Laterality Date   CATARACT EXTRACTION, BILATERAL     CORONARY ANGIOPLASTY WITH STENT PLACEMENT     FOOT SURGERY Right    cyst removal   ROTATOR CUFF REPAIR     right   ROTATOR CUFF REPAIR Left    TUBAL LIGATION     Social History:  reports that  she quit smoking about 4 years ago. Her smoking use included cigarettes. She smoked an average of 0.2 packs per day. She has been exposed to tobacco smoke. She has never used smokeless tobacco. She reports that she does not drink alcohol and does not use drugs. Family History:  Family History  Problem Relation Age of Onset   Heart attack Father    Heart attack Brother    Diabetes Sister    Cirrhosis Sister    Hypertension Sister    Cancer Mother    Hypertension Mother    Vision loss Mother    Cancer Sister    Sleep apnea Sister    Heart Problems Sister    Diabetes Sister    Gout Sister    Diabetes Son    Heart attack Son    Diabetes Daughter    Goiter Daughter    Diabetes Daughter      HOME MEDICATIONS: Allergies as of 10/30/2024       Reactions   Metformin And Related Other (See Comments)   Sick on stomach, sweaty, throw up, pain   Levemir [insulin  Detemir] Hives, Swelling   Clindamycin Nausea And Vomiting   Crestor  [rosuvastatin  Calcium ] Palpitations        Medication List        Accurate as of October 30, 2024  9:22 AM. If you have any questions, ask your nurse or doctor.          STOP taking these medications    Dexcom G6 Receiver Devi Stopped by: Emily Butts, MD       TAKE these medications    albuterol  108 (90 Base) MCG/ACT inhaler Commonly known as: VENTOLIN  HFA INHALE 2 PUFFS INTO THE LUNGS EVERY 6 HOURS AS NEEDED FOR WHEEZING   alendronate  70 MG tablet Commonly known as: FOSAMAX  TAKE ONE TABLET BY MOUTH ONCE WEEKLY ON WEDNESDAY   ALPRAZolam  0.5 MG tablet Commonly known as: XANAX  Take 1 tablet (0.5 mg total) by mouth 2 (two) times daily as needed for anxiety.   amLODipine  10 MG tablet Commonly known as: NORVASC  Take 1 tablet (10 mg total) by mouth daily.   aspirin  EC 81 MG tablet Take 81 mg by mouth daily.   atorvastatin  80 MG tablet Commonly known as: LIPITOR Take 1 tablet (80 mg total) by mouth every evening.   B-D  SINGLE USE SWABS REGULAR Pads USE AS DIRECTED   benzonatate  200 MG capsule Commonly known as: TESSALON  Take 1 capsule (200 mg total) by mouth  2 (two) times daily as needed for cough.   buPROPion  300 MG 24 hr tablet Commonly known as: Wellbutrin  XL Take 1 tablet (300 mg total) by mouth daily.   Ciclopirox  1 % shampoo Massage into scalp 2x a week for 4 weeks   Comfort EZ Pen Needles 32G X 4 MM Misc Generic drug: Insulin  Pen Needle 1 Device by Other route in the morning, at noon, in the evening, and at bedtime. as directed   dapagliflozin  propanediol 10 MG Tabs tablet Commonly known as: Farxiga  Take 1 tablet (10 mg total) by mouth daily before breakfast.   Dexcom G7 Sensor Misc 1 Device by Does not apply route as directed. Every 10 days   fenofibrate  160 MG tablet Take 1 tablet (160 mg total) by mouth every evening.   ferrous sulfate  325 (65 FE) MG tablet Take 325 mg by mouth daily with breakfast.   Iron  (Ferrous Sulfate ) 325 (65 Fe) MG Tabs Take 325 mg by mouth daily.   fexofenadine 180 MG tablet Commonly known as: ALLEGRA Take 180 mg by mouth daily as needed for allergies or rhinitis.   Fish Oil 1000 MG Caps Take 1 capsule by mouth in the morning and at bedtime.   FLUoxetine  20 MG capsule Commonly known as: PROZAC  Take 60 mg by mouth every morning.   fluticasone  50 MCG/ACT nasal spray Commonly known as: FLONASE  INSTILL 2 SPRAYS IN EACH NOSTRIL DAILY   fluticasone -salmeterol 115-21 MCG/ACT inhaler Commonly known as: Advair HFA Inhale 2 puffs into the lungs 2 (two) times daily.   furosemide  20 MG tablet Commonly known as: LASIX  Take 1 tablet (20 mg total) by mouth 2 (two) times daily.   insulin  aspart 100 UNIT/ML injection Commonly known as: NovoLOG  Max daily 60 units   metoprolol  succinate 50 MG 24 hr tablet Commonly known as: TOPROL -XL TAKE ONE TABLET BY MOUTH ONCE DAILY WITH FOOD   multivitamin with minerals Tabs tablet Take 1 tablet by mouth daily.    omeprazole  40 MG capsule Commonly known as: PRILOSEC Take 1 capsule (40 mg total) by mouth daily.   Omnipod 5 G7 Pods (Gen 5) Misc 1 Device by Does not apply route every 3 (three) days.   Omnipod 5 G7 Intro (Gen 5) Kit 1 Device by Does not apply route every other day.   promethazine -dextromethorphan 6.25-15 MG/5ML syrup Commonly known as: PROMETHAZINE -DM Take 5 mLs by mouth 4 (four) times daily as needed.   spironolactone  25 MG tablet Commonly known as: ALDACTONE  Take 1 tablet (25 mg total) by mouth daily.   THERATEARS OP Place 1 drop into both eyes daily as needed (dry eyes).   True Metrix Blood Glucose Test test strip Generic drug: glucose blood USE AS DIRECTED 4 TIMES A DAY   Contour Next Test test strip Generic drug: glucose blood Use to check blood sugar up to three times daily   valsartan  320 MG tablet Commonly known as: DIOVAN  Take 1 tablet (320 mg total) by mouth every morning.   Vitamin D  (Ergocalciferol ) 1.25 MG (50000 UNIT) Caps capsule Commonly known as: DRISDOL  TAKE 1 CAPSULE BY MOUTH EVERY 7 DAYS   vitamin E  180 MG (400 UNITS) capsule Take 400 Units by mouth daily.         OBJECTIVE:   Vital Signs: BP (!) 170/88   Ht 5' 5 (1.651 m)   Wt 190 lb (86.2 kg)   BMI 31.62 kg/m   Wt Readings from Last 3 Encounters:  10/30/24 190 lb (86.2 kg)  10/27/24 189 lb 8 oz (86 kg)  10/13/24 192 lb 6.4 oz (87.3 kg)     Exam: General: Pt appears well and is in NAD  Lungs: Clear with good BS bilat   Heart: RRR   Extremities: Trace pretibial edema  Neuro: MS is good with appropriate affect, pt is alert and Ox3    DM foot exam: 10/30/2024  The skin of the feet is intact without sores or ulcerations. The pedal pulses are 2+ on right and 2+ on left. The sensation is intact to a screening 5.07, 10 gram monofilament bilaterally     DATA REVIEWED:  Lab Results  Component Value Date   HGBA1C 10.0 (A) 10/30/2024   HGBA1C 7.9 (A) 07/24/2024   HGBA1C  8.2 (A) 03/31/2024    Latest Reference Range & Units 10/26/24 11:05  Sodium 134 - 144 mmol/L 142  Potassium 3.5 - 5.2 mmol/L 4.2  Chloride 96 - 106 mmol/L 105  CO2 20 - 29 mmol/L 22  Glucose 70 - 99 mg/dL 808 (H)  BUN 8 - 27 mg/dL 15  Creatinine 9.42 - 8.99 mg/dL 8.99  Calcium  8.7 - 10.3 mg/dL 9.9  BUN/Creatinine Ratio 12 - 28  15  eGFR >59 mL/min/1.73 58 (L)      Thyroid  Ultrasound 07/14/2024   FINDINGS: Parenchymal Echotexture: Markedly heterogenous   Isthmus: 1.8 cm   Right lobe: 7.6 x 4.2 x 5.0 cm   Left lobe: 9.0 x 5.0 x 5.5 cm   _________________________________________________________   Estimated total number of nodules >/= 1 cm: 2   Number of spongiform nodules >/=  2 cm not described below (TR1): 0   Number of mixed cystic and solid nodules >/= 1.5 cm not described below (TR2): 0   _________________________________________________________   Nodule # 1: Isoechoic solid nodule in the thyroid  isthmus is similar to slightly smaller at 1.3 x 1.3 x 1.3 cm. This nodule no longer meets criteria for continued imaging surveillance.   Nodule # 2: The previously biopsied nodule in the right lower gland is grossly unchanged at 4.2 by 3.1 x 3.6 cm compared to 4.4 x 2.6 x 3.6 cm previously.   IMPRESSION: 1. Decreasing size of isthmic thyroid  nodule which no longer meets criteria for imaging surveillance. 2. Continued stability of previously biopsied right inferior thyroid  gland. 3. Overall, similar appearance of diffusely enlarged, heterogeneous and goitrous thyroid  gland.   Old records , labs and images have been reviewed.     ASSESSMENT / PLAN / RECOMMENDATIONS:   1) Type 2 Diabetes Mellitus, poorly controlled , With CKD III, macrovascular  complications - Most recent A1c of 10.0 %. Goal A1c < 7.0 %.    -Unfortunately, patient continues with worsening glycemic control.  In reviewing her pump/CGM download, patient remains in the manual mode.  When I looked at  her PDM, it is not recognizing CGM G7 but rather G6.  I open urgent referral to our RD for weight training and updating her PDM, as this is why she continues with hyperglycemia -I have also increased her basal rate and sensitivity factor -Patient will continue to enter 18 g of carbohydrates with brunch and 20 g of carbohydrate with supper -I did offer the patient to return back to multiple daily injections of insulin  if she feels that she is struggling with managing insulin  pump, but the patient opted to remain on the pump - GLP-1 agonists and DPP-4 inhibitors are contra-indicated due to hx of pancreatitis  -Intolerant to metformin - She  has stopped Jardiance / farxiga   due to thrush - Patient continues with hyperglycemia, I will increase basal rate as below - We discontinued pioglitazone  due to edema - MA/CR ratio pending today  MEDICATIONS: - NovoLog     Pump   OmniPod Settings   Insulin  type   NovoLog    Basal rate       0000 1.50 u/h               I:C ratio       0000 1:1    Enter# 18g with breakfast and 20 g with supper              Sensitivity       0000  20      Goal       0000  120          EDUCATION / INSTRUCTIONS: BG monitoring instructions: Patient is instructed to check her blood sugars 3 times a day, before meals. Call [facility] if: BG persistently < 70  I reviewed the Rule of 15 for the treatment of hypoglycemia in detail with the patient. Literature supplied.   2) Diabetic complications:  Eye: Does not have known diabetic retinopathy.  Neuro/ Feet: Does not have known diabetic peripheral neuropathy .  Renal: Patient does not have known baseline CKD. She   is  on an ACEI/ARB at present.    3) MNG:  - Patient was diagnosed with multinodular goiter on ultrasound in 2019 - She is s/p benign FNA of the right and left nodules in 2009 - Repeat thyroid  ultrasound in October, 2025 showed stability or decrease in size of the nodules   F/U  in 3 months     In addition to time spent performing glucose monitor, I spent 25 minutes preparing to see the patient by review of recent labs, imaging and procedures, obtaining and reviewing separately obtained history, communicating with the patient/family or caregiver, ordering medications, tests or procedures, and documenting clinical information in the EHR including the differential Dx, treatment, and any further evaluation and other management   Signed electronically by: Stefano Redgie Butts, MD  [facility]  [facility] [address] [place] [place], [place] [address] Phone: [phone] FAX: [phone]   CC: Emily Cyndee Jamee JONELLE, DO [address] [place] [address] Phone: [phone]  Fax: [phone]  Return to Endocrinology clinic as below: Future Appointments  Date Time Provider Department Center  10/30/2024 11:15 AM [facility]UR[facility] None  11/27/2024  2:20 PM Vannie Reche RAMAN, NP [facility] [address]  01/29/2025  9:50 AM Priyansh Pry, Emily Redgie, MD [facility] None  06/10/2025  8:10 AM [facility] VISIT 2 [facility] [address]     "

## 2024-10-30 NOTE — Progress Notes (Addendum)
 Start of visit 1345 End time 1415  Patient was here today for retraining on the Omnipod 5 pump with use with the Dexcom G7.  She was trained on 08/20/2024 on the Dexcom G7 but it appears that she did not follow through with instructions about how to link this to her Omnipod.   Her A1C was 10% today but her pump had not been running in automated mode. Patient states that the pump is a lot easier than multiple daily injections and wishes to continue this.  She has returned this afternoon to learn how to link her Dexcom G7 and Omnipod 5.   Patient removed her Dexcom G7 Sensor and Omnipod 5 POD. POD was disabled. Sensor changed to the Dexcom G7 in her PDM and codes were entered. Dexcom sensor was placed on her left arm, paired and is now warming up. POD was filled and placed on her left hip and started.  The POD and Dexcom G7 are now working together and the pump is running in automated mode.  Reviewed bolusing with patient.  She was able to verbalize how to bolus correctly.   Discussed that blood glucose should improve now that the POD and Dexcom are communicating.  Also, patient is concerned that she will run out of Novlog before being able to refill.  Noted that TDD was low for her actual use of a POD changed every 2-2.5 days.  Message sent to MD who has increased her TDD.  Patient is to call for questions. Future visits and pump questions should be in person.  Leita Constable, RD, LDN, CDCES, DipACLM

## 2024-10-31 LAB — MICROALBUMIN / CREATININE URINE RATIO
Creatinine, Urine: 134 mg/dL (ref 20–275)
Microalb Creat Ratio: 552 mg/g{creat} — ABNORMAL HIGH
Microalb, Ur: 74 mg/dL

## 2024-11-03 ENCOUNTER — Ambulatory Visit: Payer: Self-pay | Admitting: Internal Medicine

## 2024-11-27 ENCOUNTER — Ambulatory Visit (HOSPITAL_BASED_OUTPATIENT_CLINIC_OR_DEPARTMENT_OTHER): Admitting: Family

## 2025-01-29 ENCOUNTER — Ambulatory Visit: Admitting: Internal Medicine

## 2025-06-10 ENCOUNTER — Ambulatory Visit
